# Patient Record
Sex: Male | Born: 1950 | Race: White | Hispanic: No | Smoking: Never smoker
Health system: Southern US, Community
[De-identification: ages and names within clinical notes are randomized; demographics above are authoritative.]

## PROBLEM LIST (undated history)

## (undated) DIAGNOSIS — M1990 Unspecified osteoarthritis, unspecified site: Secondary | ICD-10-CM

## (undated) DIAGNOSIS — E119 Type 2 diabetes mellitus without complications: Secondary | ICD-10-CM

## (undated) DIAGNOSIS — Z9889 Other specified postprocedural states: Secondary | ICD-10-CM

## (undated) DIAGNOSIS — N289 Disorder of kidney and ureter, unspecified: Secondary | ICD-10-CM

## (undated) DIAGNOSIS — J189 Pneumonia, unspecified organism: Secondary | ICD-10-CM

## (undated) DIAGNOSIS — C801 Malignant (primary) neoplasm, unspecified: Secondary | ICD-10-CM

## (undated) DIAGNOSIS — Z21 Asymptomatic human immunodeficiency virus [HIV] infection status: Secondary | ICD-10-CM

## (undated) DIAGNOSIS — F419 Anxiety disorder, unspecified: Secondary | ICD-10-CM

## (undated) DIAGNOSIS — F32A Depression, unspecified: Secondary | ICD-10-CM

## (undated) DIAGNOSIS — T8859XA Other complications of anesthesia, initial encounter: Secondary | ICD-10-CM

## (undated) DIAGNOSIS — I10 Essential (primary) hypertension: Secondary | ICD-10-CM

## (undated) DIAGNOSIS — R000 Tachycardia, unspecified: Secondary | ICD-10-CM

## (undated) DIAGNOSIS — B20 Human immunodeficiency virus [HIV] disease: Secondary | ICD-10-CM

## (undated) DIAGNOSIS — R1310 Dysphagia, unspecified: Secondary | ICD-10-CM

## (undated) DIAGNOSIS — Z87442 Personal history of urinary calculi: Secondary | ICD-10-CM

## (undated) DIAGNOSIS — E7800 Pure hypercholesterolemia, unspecified: Secondary | ICD-10-CM

## (undated) DIAGNOSIS — K219 Gastro-esophageal reflux disease without esophagitis: Secondary | ICD-10-CM

## (undated) DIAGNOSIS — C4491 Basal cell carcinoma of skin, unspecified: Secondary | ICD-10-CM

## (undated) DIAGNOSIS — B1920 Unspecified viral hepatitis C without hepatic coma: Secondary | ICD-10-CM

## (undated) DIAGNOSIS — G4730 Sleep apnea, unspecified: Secondary | ICD-10-CM

## (undated) HISTORY — PX: ANKLE ARTHROSCOPY: SHX545

## (undated) HISTORY — PX: TONSILLECTOMY: SUR1361

## (undated) HISTORY — PX: THORACOTOMY: SUR1349

## (undated) HISTORY — PX: BACK SURGERY: SHX140

## (undated) HISTORY — PX: APPENDECTOMY: SHX54

## (undated) HISTORY — DX: Basal cell carcinoma of skin, unspecified: C44.91

---

## 2014-10-23 DIAGNOSIS — F32A Depression, unspecified: Secondary | ICD-10-CM | POA: Insufficient documentation

## 2014-10-23 DIAGNOSIS — B370 Candidal stomatitis: Secondary | ICD-10-CM | POA: Insufficient documentation

## 2014-10-23 DIAGNOSIS — F329 Major depressive disorder, single episode, unspecified: Secondary | ICD-10-CM | POA: Insufficient documentation

## 2014-10-23 DIAGNOSIS — F419 Anxiety disorder, unspecified: Secondary | ICD-10-CM | POA: Insufficient documentation

## 2014-10-23 DIAGNOSIS — G4700 Insomnia, unspecified: Secondary | ICD-10-CM | POA: Insufficient documentation

## 2014-10-23 DIAGNOSIS — R21 Rash and other nonspecific skin eruption: Secondary | ICD-10-CM | POA: Insufficient documentation

## 2015-01-07 DIAGNOSIS — R351 Nocturia: Secondary | ICD-10-CM | POA: Insufficient documentation

## 2019-01-19 DIAGNOSIS — M5442 Lumbago with sciatica, left side: Secondary | ICD-10-CM | POA: Insufficient documentation

## 2019-01-19 DIAGNOSIS — E1143 Type 2 diabetes mellitus with diabetic autonomic (poly)neuropathy: Secondary | ICD-10-CM | POA: Insufficient documentation

## 2019-01-19 DIAGNOSIS — M542 Cervicalgia: Secondary | ICD-10-CM | POA: Insufficient documentation

## 2019-01-19 DIAGNOSIS — G5793 Unspecified mononeuropathy of bilateral lower limbs: Secondary | ICD-10-CM | POA: Insufficient documentation

## 2019-01-19 DIAGNOSIS — G43109 Migraine with aura, not intractable, without status migrainosus: Secondary | ICD-10-CM | POA: Insufficient documentation

## 2019-01-19 DIAGNOSIS — G8929 Other chronic pain: Secondary | ICD-10-CM | POA: Insufficient documentation

## 2019-02-10 ENCOUNTER — Encounter (HOSPITAL_COMMUNITY): Payer: Self-pay | Admitting: Emergency Medicine

## 2019-02-10 ENCOUNTER — Emergency Department (HOSPITAL_COMMUNITY)
Admission: EM | Admit: 2019-02-10 | Discharge: 2019-02-10 | Disposition: A | Discharge status: Home / Self Care | Payer: Medicare Other | Attending: Emergency Medicine | Admitting: Emergency Medicine

## 2019-02-10 DIAGNOSIS — Z21 Asymptomatic human immunodeficiency virus [HIV] infection status: Secondary | ICD-10-CM | POA: Diagnosis not present

## 2019-02-10 DIAGNOSIS — R202 Paresthesia of skin: Secondary | ICD-10-CM | POA: Diagnosis not present

## 2019-02-10 DIAGNOSIS — R519 Headache, unspecified: Secondary | ICD-10-CM

## 2019-02-10 DIAGNOSIS — R51 Headache: Secondary | ICD-10-CM | POA: Insufficient documentation

## 2019-02-10 MED ORDER — GABAPENTIN 300 MG PO CAPS
300.0000 mg | ORAL_CAPSULE | Freq: Once | ORAL | Status: AC
  Administered 2019-02-10: 300 mg via ORAL
  Filled 2019-02-10: qty 1

## 2019-02-10 MED ORDER — GABAPENTIN 300 MG PO CAPS: 300.0000 mg | ORAL_CAPSULE | Freq: Three times a day (TID) | ORAL | 0 refills | Status: DC

## 2019-02-10 NOTE — ED Provider Notes (Signed)
[facility] DEPT Provider Note   CSN: [id number] Arrival date & time: 02/10/19  8099    History   Chief Complaint Chief Complaint  Patient presents with  . Facial Pain  . Headache    HPI Brian Malone is a 68 y.o. male.     HPI  Patient presents with concern of left facial pain. Patient has multiple medical issues including HIV, trigeminal neuralgia, migraine headaches. He now presents due to left facial pain consistent with multiple prior episodes, though more severe than usual. This episode began about 3 days ago, and initially was accompanied by headache. Patient has had relief of his headache following OTC medication use. Notably, the patient is in transition for outpatient providers, and has outpatient medication provision scheduled for 3 days from now. He currently has no Topamax, or gabapentin, both of which have been used previously with success to control facial pain. He denies other focal complaints including ongoing headache, weakness anywhere, vision loss, confusion, disorientation, vomiting.   History reviewed. No pertinent past medical history.  There are no active problems to display for this patient.   History reviewed. No pertinent surgical history.      Home Medications    Prior to Admission medications   Medication Sig Start Date End Date Taking? Authorizing Provider  gabapentin (NEURONTIN) 300 MG capsule Take 1 capsule (300 mg total) by mouth 3 (three) times daily. 02/10/19   Carmin Muskrat, MD    Family History No family history on file.  Social History Social History   Tobacco Use  . Smoking status: Never Smoker  . Smokeless tobacco: Never Used  Substance Use Topics  . Alcohol use: Never    Frequency: Never  . Drug use: Never     Allergies   Patient has no allergy information on record.   Review of Systems Review of Systems  Constitutional:       Per HPI, otherwise negative  HENT:   Per HPI, otherwise negative  Respiratory:       Per HPI, otherwise negative  Cardiovascular:       Per HPI, otherwise negative  Gastrointestinal: Negative for vomiting.  Endocrine:       Negative aside from HPI  Genitourinary:       Neg aside from HPI   Musculoskeletal:       Per HPI, otherwise negative  Skin: Negative.   Allergic/Immunologic: Positive for immunocompromised state.  Neurological: Positive for headaches. Negative for syncope and weakness.     Physical Exam Updated Vital Signs There were no vitals taken for this visit.  Physical Exam Vitals signs and nursing note reviewed.  Constitutional:      General: He is not in acute distress.    Appearance: He is well-developed.  HENT:     Head: Normocephalic and atraumatic.  Eyes:     Conjunctiva/sclera: Conjunctivae normal.  Cardiovascular:     Rate and Rhythm: Normal rate and regular rhythm.  Pulmonary:     Effort: Pulmonary effort is normal. No respiratory distress.     Breath sounds: No stridor.  Abdominal:     General: There is no distension.  Skin:    General: Skin is warm and dry.  Neurological:     Mental Status: He is alert and oriented to person, place, and time.     Comments: Hyperesthesia left face, patient hesitant to allow touch.  No gross facial asymmetry, speech is normal.      ED Treatments / Results  Procedures Procedures (including critical care time)  Medications Ordered in ED Medications  gabapentin (NEURONTIN) capsule 300 mg (has no administration in time range)     Initial Impression / Assessment and Plan / ED Course  I have reviewed the triage vital signs and the nursing notes.  Pertinent labs & imaging results that were available during my care of the patient were reviewed by me and considered in my medical decision making (see chart for details).  The initial evaluation lengthy conversation on trigeminal neuralgia, the patient medical history, importance of following up  with outpatient providers, to which she has multiple referrals currently, patient received a dose of Neurontin. Chart review notable for multiple medications, which are seemingly ordered for arrival in the next 3 days. Without other focal complaints, with no evidence for neurovascular compromise, no distress, no hemodynamic instability, the patient was provided a short course of Neurontin until his medications arrive, as well as the initial dose in the emergency department.  Final Clinical Impressions(s) / ED Diagnoses   Final diagnoses:  Facial pain    ED Discharge Orders         Ordered    gabapentin (NEURONTIN) 300 MG capsule  3 times daily     02/10/19 1046           Carmin Muskrat, MD 02/10/19 1053

## 2019-02-10 NOTE — ED Triage Notes (Signed)
Patient here from home with complaints of headache and facial pain since Wednesday. Hx of trigeminal neuralgia. States that normal meds are suppose to be delivered on Tuesday but cannot wait.

## 2019-02-10 NOTE — Discharge Instructions (Signed)
As discussed, your evaluation today has been largely reassuring.  But, it is important that you monitor your condition carefully, and do not hesitate to return to the ED if you develop new, or concerning changes in your condition. ? ?Otherwise, please follow-up with your physician for appropriate ongoing care. ? ?

## 2019-02-22 DIAGNOSIS — I10 Essential (primary) hypertension: Secondary | ICD-10-CM | POA: Insufficient documentation

## 2019-02-22 DIAGNOSIS — M19171 Post-traumatic osteoarthritis, right ankle and foot: Secondary | ICD-10-CM | POA: Insufficient documentation

## 2019-02-22 DIAGNOSIS — G500 Trigeminal neuralgia: Secondary | ICD-10-CM | POA: Insufficient documentation

## 2019-02-22 DIAGNOSIS — N401 Enlarged prostate with lower urinary tract symptoms: Secondary | ICD-10-CM | POA: Insufficient documentation

## 2019-02-22 DIAGNOSIS — E1165 Type 2 diabetes mellitus with hyperglycemia: Secondary | ICD-10-CM | POA: Insufficient documentation

## 2019-03-07 ENCOUNTER — Other Ambulatory Visit: Payer: Self-pay

## 2019-03-07 ENCOUNTER — Other Ambulatory Visit: Payer: Self-pay | Admitting: Podiatry

## 2019-03-07 ENCOUNTER — Encounter: Payer: Self-pay | Admitting: Podiatry

## 2019-03-07 ENCOUNTER — Ambulatory Visit (INDEPENDENT_AMBULATORY_CARE_PROVIDER_SITE_OTHER): Payer: Medicare Other

## 2019-03-07 ENCOUNTER — Ambulatory Visit (INDEPENDENT_AMBULATORY_CARE_PROVIDER_SITE_OTHER): Payer: Medicare Other | Admitting: Podiatry

## 2019-03-07 VITALS — BP 145/89

## 2019-03-07 DIAGNOSIS — M25571 Pain in right ankle and joints of right foot: Secondary | ICD-10-CM

## 2019-03-07 DIAGNOSIS — M65171 Other infective (teno)synovitis, right ankle and foot: Secondary | ICD-10-CM | POA: Diagnosis not present

## 2019-03-07 DIAGNOSIS — M2041 Other hammer toe(s) (acquired), right foot: Secondary | ICD-10-CM

## 2019-03-07 DIAGNOSIS — M65972 Unspecified synovitis and tenosynovitis, left ankle and foot: Secondary | ICD-10-CM

## 2019-03-07 DIAGNOSIS — M2042 Other hammer toe(s) (acquired), left foot: Secondary | ICD-10-CM

## 2019-03-07 DIAGNOSIS — M65172 Other infective (teno)synovitis, left ankle and foot: Secondary | ICD-10-CM

## 2019-03-07 DIAGNOSIS — M25572 Pain in left ankle and joints of left foot: Principal | ICD-10-CM

## 2019-03-07 DIAGNOSIS — M65971 Unspecified synovitis and tenosynovitis, right ankle and foot: Secondary | ICD-10-CM

## 2019-03-07 DIAGNOSIS — M659 Synovitis and tenosynovitis, unspecified: Secondary | ICD-10-CM | POA: Diagnosis not present

## 2019-03-07 DIAGNOSIS — E0843 Diabetes mellitus due to underlying condition with diabetic autonomic (poly)neuropathy: Secondary | ICD-10-CM | POA: Diagnosis not present

## 2019-03-07 DIAGNOSIS — L84 Corns and callosities: Secondary | ICD-10-CM

## 2019-03-12 NOTE — Progress Notes (Signed)
   HPI: 68 year old male presenting today as a new patient with a chief complaint of sharp, shooting bilateral ankle pain that began a few years ago. He reports associated pain to the dorsum of the feet. Walking increases the pain. He has been using orthotics and taking OTC Tylenol for treatment. Patient is here for further evaluation and treatment.   No past medical history on file.    Objective: Physical Exam General: The patient is alert and oriented x3 in no acute distress.  Dermatology: Hyperkeratotic lesions present on the sub-first MPJs of the bilateral feet. Pain on palpation with a central nucleated core noted. Skin is cool, dry and supple bilateral lower extremities. Negative for open lesions or macerations.  Vascular: Palpable pedal pulses bilaterally. No edema or erythema noted. Capillary refill within normal limits.  Neurological: Epicritic and protective threshold diminished bilaterally.   Musculoskeletal Exam: All pedal and ankle joints range of motion within normal limits bilateral. Muscle strength 5/5 in all groups bilateral. Hammertoe contracture deformity noted to digits 2-5 of the bilateral feet. Pain with palpation noted to the anterior, lateral and medial aspects of the bilateral ankle joints.   Radiographic Exam: Hammertoe contracture deformity noted to the interphalangeal joints and MPJ of the respective hammertoe digits mentioned on clinical musculoskeletal exam. Fibular plate and screws noted to the right ankle. Hardware intact.   Assessment: 1. Bilateral ankle synovitis  2. Hammertoes noted to digits 2-5 bilateral  3. Pre-ulcerative callus lesions sub-first MPJs bilateral 4. T2DM with periopheral neuropathy    Plan of Care:  1. Patient evaluated. X-Rays reviewed.  2. Injection of 0.5 mLs Celestone Soluspan injected into the bilateral ankle joints.  3. Continue taking Gabapentin as directed by neurologist.  4. Continue taking OTC Tylenol daily. Patient has  CKD and cannot take NSAIDs.  5. Appointment with Liliane Channel, Pedorthist, for DM shoes and insoles. 6. Return to clinic in 4 weeks.      Edrick Kins, DPM [facility]  Dr. Edrick Kins, DPM    [address], [place] [address]                Office [phone]  Fax [phone]

## 2019-03-20 ENCOUNTER — Other Ambulatory Visit: Payer: Medicare Other | Admitting: Orthotics

## 2019-04-02 ENCOUNTER — Other Ambulatory Visit: Payer: Self-pay

## 2019-04-02 ENCOUNTER — Ambulatory Visit (INDEPENDENT_AMBULATORY_CARE_PROVIDER_SITE_OTHER): Payer: Medicare Other

## 2019-04-02 ENCOUNTER — Ambulatory Visit (INDEPENDENT_AMBULATORY_CARE_PROVIDER_SITE_OTHER): Payer: Medicare Other | Admitting: Podiatry

## 2019-04-02 ENCOUNTER — Other Ambulatory Visit: Payer: Self-pay | Admitting: Podiatry

## 2019-04-02 ENCOUNTER — Encounter: Payer: Self-pay | Admitting: Podiatry

## 2019-04-02 VITALS — Temp 97.5°F

## 2019-04-02 DIAGNOSIS — S92401A Displaced unspecified fracture of right great toe, initial encounter for closed fracture: Secondary | ICD-10-CM | POA: Diagnosis not present

## 2019-04-02 DIAGNOSIS — S9031XA Contusion of right foot, initial encounter: Secondary | ICD-10-CM

## 2019-04-02 DIAGNOSIS — S92414A Nondisplaced fracture of proximal phalanx of right great toe, initial encounter for closed fracture: Secondary | ICD-10-CM | POA: Diagnosis not present

## 2019-04-02 NOTE — Progress Notes (Signed)
   HPI: 68 year old male presents the office today with a new complaint regarding an injury that occurred approximately 10 days ago.  Patient states that he rushed to help a gentleman who was falling and he ended up falling down an embankment and landed in a hole with multiple injuries through his arms and foot.  He says that his right great toe swelled up and became bruised and swollen.  Pain with ambulation.  He presents for further treatment and evaluation  No past medical history on file.   Physical Exam: General: The patient is alert and oriented x3 in no acute distress.  Dermatology: Skin is warm, dry and supple bilateral lower extremities. Negative for open lesions or macerations.  Vascular: Palpable pedal pulses bilaterally.  Moderate edema noted to the right great toe capillary refill within normal limits.  Neurological: Epicritic and protective threshold grossly intact bilaterally.   Musculoskeletal Exam: Range of motion within normal limits to all pedal and ankle joints bilateral. Muscle strength 5/5 in all groups bilateral.  There is some edema noted to the right great toe around the first MPJ.  Pain on palpation and manipulation also noted consistent with the fracture.  Radiographic Exam:  Normal osseous mineralization.  Comminuted displaced fracture noted to the distal aspect of the proximal phalanx right great toe intra-articular to the IPJ.  Assessment: 1.  Closed, comminuted, displaced fracture proximal phalanx right great toe   Plan of Care:  1. Patient evaluated. X-Rays reviewed.  2. Today we discussed the conservative versus surgical management of the presenting pathology. The patient opts for surgical management. All possible complications and details of the procedure were explained. All patient questions were answered. No guarantees were expressed or implied. 3. Authorization for surgery was initiated today. Surgery will consist of open reduction internal fixation  right great toe fracture 4.  Return to clinic 1 week postop        Edrick Kins, DPM [facility]  Dr. Edrick Kins, DPM    [address], [place] [address]                Office [phone]  Fax [phone]

## 2019-04-04 ENCOUNTER — Other Ambulatory Visit: Payer: Self-pay | Admitting: Podiatry

## 2019-04-04 ENCOUNTER — Encounter: Payer: Self-pay | Admitting: Podiatry

## 2019-04-04 DIAGNOSIS — S92401A Displaced unspecified fracture of right great toe, initial encounter for closed fracture: Secondary | ICD-10-CM

## 2019-04-09 ENCOUNTER — Ambulatory Visit (INDEPENDENT_AMBULATORY_CARE_PROVIDER_SITE_OTHER): Payer: Medicare Other | Admitting: Podiatry

## 2019-04-09 ENCOUNTER — Ambulatory Visit (INDEPENDENT_AMBULATORY_CARE_PROVIDER_SITE_OTHER): Payer: Medicare Other

## 2019-04-09 ENCOUNTER — Other Ambulatory Visit: Payer: Self-pay

## 2019-04-09 VITALS — Temp 97.3°F

## 2019-04-09 DIAGNOSIS — Z09 Encounter for follow-up examination after completed treatment for conditions other than malignant neoplasm: Secondary | ICD-10-CM | POA: Diagnosis not present

## 2019-04-09 DIAGNOSIS — S92401A Displaced unspecified fracture of right great toe, initial encounter for closed fracture: Secondary | ICD-10-CM

## 2019-04-09 MED ORDER — OXYCODONE HCL 5 MG PO TABS: 5.0000 mg | ORAL_TABLET | Freq: Four times a day (QID) | ORAL | 0 refills | Status: DC | PRN

## 2019-04-09 MED ORDER — DOXYCYCLINE HYCLATE 100 MG PO TABS: 100.0000 mg | ORAL_TABLET | Freq: Two times a day (BID) | ORAL | 0 refills | Status: DC

## 2019-04-09 NOTE — Progress Notes (Signed)
   Subjective:  Patient presents today status post ORIF right great toe fracture. DOS: 04/04/2019.  Patient continues to have some pain in the toe with cramping of his feet but he otherwise has no new complaints.  He has been weightbearing in immobilization cam boot as directed.  No past medical history on file.    Objective/Physical Exam Neurovascular status intact.  Skin incisions appear to be well coapted with sutures and staples intact.  There is some increased erythema noted encompassing the great toe concerning for possible postoperative cellulitis.  No active bleeding noted. Moderate edema noted to the surgical extremity.  Radiographic Exam:  Percutaneous fixation pin and fracture sites appear to be stable with routine healing.  Assessment: 1. s/p ORIF right great toe. DOS: 04/04/2019   Plan of Care:  1. Patient was evaluated. X-rays reviewed 2.  Dressings were changed today.  Keep clean dry and intact x1 week 3.  Continue weightbearing in the immobilization cam boot 4.  Prescription for doxycycline 100 mg #20 5.  Prescription for oxycodone 5 mg #30 every 6 hours 6.  Return to clinic in 1 week   Edrick Kins, DPM [facility]  Dr. Edrick Kins, [address] [place]                                        [place],  [address]                Office [phone]  Fax [phone]

## 2019-04-15 ENCOUNTER — Emergency Department (HOSPITAL_BASED_OUTPATIENT_CLINIC_OR_DEPARTMENT_OTHER): Payer: Medicare Other

## 2019-04-15 ENCOUNTER — Encounter (HOSPITAL_BASED_OUTPATIENT_CLINIC_OR_DEPARTMENT_OTHER): Payer: Self-pay | Admitting: Emergency Medicine

## 2019-04-15 ENCOUNTER — Encounter: Payer: Self-pay | Admitting: Podiatry

## 2019-04-15 ENCOUNTER — Inpatient Hospital Stay (HOSPITAL_BASED_OUTPATIENT_CLINIC_OR_DEPARTMENT_OTHER)
Admission: EM | Admit: 2019-04-15 | Discharge: 2019-04-18 | DRG: 863 | Disposition: A | Discharge status: Home / Self Care | Payer: Medicare Other | Attending: Internal Medicine | Admitting: Internal Medicine

## 2019-04-15 ENCOUNTER — Other Ambulatory Visit: Payer: Self-pay

## 2019-04-15 DIAGNOSIS — E1140 Type 2 diabetes mellitus with diabetic neuropathy, unspecified: Secondary | ICD-10-CM | POA: Diagnosis present

## 2019-04-15 DIAGNOSIS — Z21 Asymptomatic human immunodeficiency virus [HIV] infection status: Secondary | ICD-10-CM | POA: Diagnosis present

## 2019-04-15 DIAGNOSIS — R6510 Systemic inflammatory response syndrome (SIRS) of non-infectious origin without acute organ dysfunction: Secondary | ICD-10-CM | POA: Diagnosis present

## 2019-04-15 DIAGNOSIS — L0390 Cellulitis, unspecified: Secondary | ICD-10-CM | POA: Diagnosis present

## 2019-04-15 DIAGNOSIS — Z885 Allergy status to narcotic agent status: Secondary | ICD-10-CM

## 2019-04-15 DIAGNOSIS — I129 Hypertensive chronic kidney disease with stage 1 through stage 4 chronic kidney disease, or unspecified chronic kidney disease: Secondary | ICD-10-CM | POA: Diagnosis present

## 2019-04-15 DIAGNOSIS — Z20828 Contact with and (suspected) exposure to other viral communicable diseases: Secondary | ICD-10-CM | POA: Diagnosis present

## 2019-04-15 DIAGNOSIS — T8141XA Infection following a procedure, superficial incisional surgical site, initial encounter: Principal | ICD-10-CM | POA: Diagnosis present

## 2019-04-15 DIAGNOSIS — E11628 Type 2 diabetes mellitus with other skin complications: Secondary | ICD-10-CM | POA: Diagnosis not present

## 2019-04-15 DIAGNOSIS — L089 Local infection of the skin and subcutaneous tissue, unspecified: Secondary | ICD-10-CM

## 2019-04-15 DIAGNOSIS — R000 Tachycardia, unspecified: Secondary | ICD-10-CM | POA: Diagnosis present

## 2019-04-15 DIAGNOSIS — Z7984 Long term (current) use of oral hypoglycemic drugs: Secondary | ICD-10-CM

## 2019-04-15 DIAGNOSIS — F419 Anxiety disorder, unspecified: Secondary | ICD-10-CM | POA: Diagnosis present

## 2019-04-15 DIAGNOSIS — R0602 Shortness of breath: Secondary | ICD-10-CM | POA: Diagnosis present

## 2019-04-15 DIAGNOSIS — G8929 Other chronic pain: Secondary | ICD-10-CM | POA: Diagnosis present

## 2019-04-15 DIAGNOSIS — G43909 Migraine, unspecified, not intractable, without status migrainosus: Secondary | ICD-10-CM | POA: Diagnosis present

## 2019-04-15 DIAGNOSIS — E1122 Type 2 diabetes mellitus with diabetic chronic kidney disease: Secondary | ICD-10-CM | POA: Diagnosis present

## 2019-04-15 DIAGNOSIS — N400 Enlarged prostate without lower urinary tract symptoms: Secondary | ICD-10-CM | POA: Diagnosis present

## 2019-04-15 DIAGNOSIS — T8149XA Infection following a procedure, other surgical site, initial encounter: Secondary | ICD-10-CM | POA: Diagnosis not present

## 2019-04-15 DIAGNOSIS — L03031 Cellulitis of right toe: Secondary | ICD-10-CM | POA: Diagnosis present

## 2019-04-15 DIAGNOSIS — E119 Type 2 diabetes mellitus without complications: Secondary | ICD-10-CM

## 2019-04-15 DIAGNOSIS — R079 Chest pain, unspecified: Secondary | ICD-10-CM

## 2019-04-15 DIAGNOSIS — R509 Fever, unspecified: Secondary | ICD-10-CM

## 2019-04-15 DIAGNOSIS — N183 Chronic kidney disease, stage 3 (moderate): Secondary | ICD-10-CM | POA: Diagnosis present

## 2019-04-15 DIAGNOSIS — Y838 Other surgical procedures as the cause of abnormal reaction of the patient, or of later complication, without mention of misadventure at the time of the procedure: Secondary | ICD-10-CM | POA: Diagnosis present

## 2019-04-15 DIAGNOSIS — Z79899 Other long term (current) drug therapy: Secondary | ICD-10-CM | POA: Diagnosis not present

## 2019-04-15 DIAGNOSIS — Z8249 Family history of ischemic heart disease and other diseases of the circulatory system: Secondary | ICD-10-CM | POA: Diagnosis not present

## 2019-04-15 HISTORY — DX: Anxiety disorder, unspecified: F41.9

## 2019-04-15 HISTORY — DX: Human immunodeficiency virus (HIV) disease: B20

## 2019-04-15 HISTORY — DX: Type 2 diabetes mellitus without complications: E11.9

## 2019-04-15 HISTORY — DX: Disorder of kidney and ureter, unspecified: N28.9

## 2019-04-15 HISTORY — DX: Essential (primary) hypertension: I10

## 2019-04-15 HISTORY — DX: Asymptomatic human immunodeficiency virus (hiv) infection status: Z21

## 2019-04-15 LAB — CBC WITH DIFFERENTIAL/PLATELET
Abs Immature Granulocytes: 0.04 10*3/uL (ref 0.00–0.07)
Basophils Absolute: 0 10*3/uL (ref 0.0–0.1)
Basophils Relative: 0 %
Eosinophils Absolute: 0.2 10*3/uL (ref 0.0–0.5)
Eosinophils Relative: 2 %
HCT: 43.2 % (ref 39.0–52.0)
Hemoglobin: 14.3 g/dL (ref 13.0–17.0)
Immature Granulocytes: 1 %
Lymphocytes Relative: 14 %
Lymphs Abs: 1 10*3/uL (ref 0.7–4.0)
MCH: 33.6 pg (ref 26.0–34.0)
MCHC: 33.1 g/dL (ref 30.0–36.0)
MCV: 101.6 fL — ABNORMAL HIGH (ref 80.0–100.0)
Monocytes Absolute: 0.9 10*3/uL (ref 0.1–1.0)
Monocytes Relative: 13 %
Neutro Abs: 5 10*3/uL (ref 1.7–7.7)
Neutrophils Relative %: 70 %
Platelets: 223 10*3/uL (ref 150–400)
RBC: 4.25 MIL/uL (ref 4.22–5.81)
RDW: 13.9 % (ref 11.5–15.5)
WBC: 7.1 10*3/uL (ref 4.0–10.5)
nRBC: 0 % (ref 0.0–0.2)

## 2019-04-15 LAB — COMPREHENSIVE METABOLIC PANEL
ALT: 20 U/L (ref 0–44)
AST: 23 U/L (ref 15–41)
Albumin: 4.1 g/dL (ref 3.5–5.0)
Alkaline Phosphatase: 96 U/L (ref 38–126)
Anion gap: 10 (ref 5–15)
BUN: 33 mg/dL — ABNORMAL HIGH (ref 8–23)
CO2: 18 mmol/L — ABNORMAL LOW (ref 22–32)
Calcium: 9.6 mg/dL (ref 8.9–10.3)
Chloride: 109 mmol/L (ref 98–111)
Creatinine, Ser: 1.45 mg/dL — ABNORMAL HIGH (ref 0.61–1.24)
GFR calc Af Amer: 58 mL/min — ABNORMAL LOW (ref >60)
GFR calc non Af Amer: 50 mL/min — ABNORMAL LOW (ref >60)
Glucose, Bld: 80 mg/dL (ref 70–99)
Potassium: 3.5 mmol/L (ref 3.5–5.1)
Sodium: 137 mmol/L (ref 135–145)
Total Bilirubin: 0.7 mg/dL (ref 0.3–1.2)
Total Protein: 7.8 g/dL (ref 6.5–8.1)

## 2019-04-15 LAB — SARS CORONAVIRUS 2 BY RT PCR (HOSPITAL ORDER, PERFORMED IN ~~LOC~~ HOSPITAL LAB): SARS Coronavirus 2: NEGATIVE

## 2019-04-15 LAB — TROPONIN I: Troponin I: <0.03 ng/mL (ref <0.03)

## 2019-04-15 LAB — LACTIC ACID, PLASMA
Lactic Acid, Venous: 0.9 mmol/L (ref 0.5–1.9)
Lactic Acid, Venous: 1.9 mmol/L (ref 0.5–1.9)

## 2019-04-15 MED ORDER — SODIUM CHLORIDE 0.9 % IV BOLUS
1000.0000 mL | Freq: Once | INTRAVENOUS | Status: AC
  Administered 2019-04-15: 1000 mL via INTRAVENOUS

## 2019-04-15 MED ORDER — PIPERACILLIN-TAZOBACTAM 3.375 G IVPB 30 MIN
3.3750 g | Freq: Once | INTRAVENOUS | Status: AC
  Administered 2019-04-15: 3.375 g via INTRAVENOUS
  Filled 2019-04-15 (×2): qty 50

## 2019-04-15 MED ORDER — OXYCODONE-ACETAMINOPHEN 5-325 MG PO TABS
1.0000 | ORAL_TABLET | Freq: Once | ORAL | Status: AC
  Administered 2019-04-15: 1 via ORAL
  Filled 2019-04-15: qty 1

## 2019-04-15 MED ORDER — VANCOMYCIN HCL IN DEXTROSE 1-5 GM/200ML-% IV SOLN
1000.0000 mg | Freq: Once | INTRAVENOUS | Status: AC
  Administered 2019-04-15: 1000 mg via INTRAVENOUS
  Filled 2019-04-15: qty 200

## 2019-04-15 MED ORDER — IOHEXOL 350 MG/ML SOLN
100.0000 mL | Freq: Once | INTRAVENOUS | Status: AC | PRN
  Administered 2019-04-15: 79 mL via INTRAVENOUS

## 2019-04-15 MED ORDER — HYDROMORPHONE HCL 1 MG/ML IJ SOLN
1.0000 mg | Freq: Once | INTRAMUSCULAR | Status: AC
  Administered 2019-04-15: 1 mg via INTRAVENOUS
  Filled 2019-04-15: qty 1

## 2019-04-15 NOTE — Progress Notes (Signed)
Received call from [facility] ER. Patient with infection to foot s/p ORIF of digit with k-wire fixation. Patient to be admitted to [facility]. Start broad spectrum antibiotics. Dr. Amalia Hailey notified and will see the patient in the morning. NPO after midnight.   Celesta Gentile, DPM O: [phone] C: [phone]

## 2019-04-15 NOTE — Progress Notes (Signed)
Pharmacy Antibiotic Note  CASTER FAYETTE is a 68 y.o. male admitted on 04/15/2019 with cellulitis.  Pharmacy has been consulted for vancomycin dosing. -WBC= 7.1, afebrile, SCr= 1.45, CrCL ~ 60  Plan: -vancomycin 2000mg  IV x1 followed by 129m IV q24h (calculated AUC= 474) -Will follow renal function, cultures and clinical progress   Height: 6' (182.9 cm) Weight: 230 lb (104.3 kg) IBW/kg (Calculated) : 77.6  Temp (24hrs), Avg:99.7 F (37.6 C), Min:99.5 F (37.5 C), Max:99.9 F (37.7 C)  Recent Labs  Lab 04/15/19 1540  WBC 7.1  CREATININE 1.45*  LATICACIDVEN 1.9    Estimated Creatinine Clearance: 63.4 mL/min (A) (by C-G formula based on SCr of 1.45 mg/dL (H)).    Allergies  Allergen Reactions  . Morphine Other (See Comments)    Itching     Antimicrobials this admission: 4/26  Dose adjustments this admission:   Microbiology results:   Thank you for allowing pharmacy to be a part of this patient's care.  Hildred Laser, PharmD Clinical Pharmacist **Pharmacist phone directory can now be found on [email or web address] ([email or web address]).  Listed under [place].

## 2019-04-15 NOTE — ED Notes (Signed)
[facility] notified (Tammy) - patient ready for transport 

## 2019-04-15 NOTE — ED Notes (Signed)
[facility] arrived to transport pt 

## 2019-04-15 NOTE — ED Provider Notes (Signed)
[facility]ERG[facility] DEPARTMENT Provider Note   CSN: [id number] Arrival date & time: 04/15/19  1459    History   Chief Complaint Chief Complaint  Patient presents with   Shortness of Breath    HPI Brian Malone is a 68 y.o. male.     The history is provided by the patient and medical records. No language interpreter was used.  Shortness of Breath  Severity:  Severe Onset quality:  Gradual Duration:  3 days Timing:  Constant Progression:  Waxing and waning Chronicity:  New Relieved by:  Nothing Worsened by:  Exertion and deep breathing Ineffective treatments:  None tried Associated symptoms: chest pain, cough and fever   Associated symptoms: no abdominal pain, no diaphoresis, no headaches, no hemoptysis, no neck pain, no rash, no sputum production, no vomiting and no wheezing   Risk factors: no hx of PE/DVT     Past Medical History:  Diagnosis Date   Anxiety    Diabetes mellitus without complication (HCC)    HIV (human immunodeficiency virus infection) ([place])    Hypertension    Renal disorder     There are no active problems to display for this patient.   Past Surgical History:  Procedure Laterality Date   ANKLE ARTHROSCOPY          Home Medications    Prior to Admission medications   Medication Sig Start Date End Date Taking? Authorizing Provider  busPIRone (BUSPAR) 10 MG tablet  01/22/19   [provider]  doxycycline (VIBRA-TABS) 100 MG tablet Take 1 tablet (100 mg total) by mouth 2 (two) times daily. 04/09/19   Edrick Kins, DPM  DULoxetine (CYMBALTA) 30 MG capsule  01/22/19   [provider]  gabapentin (NEURONTIN) 300 MG capsule Take 1 capsule (300 mg total) by mouth 3 (three) times daily. 02/10/19   Carmin Muskrat, MD  glipiZIDE (GLUCOTROL) 5 MG tablet Take by mouth.    [provider]  hydrOXYzine (VISTARIL) 50 MG capsule  01/22/19   [provider]  metFORMIN (GLUCOPHAGE) 500 MG tablet Take  by mouth. 02/22/19 05/23/19  [provider]  mirabegron ER (MYRBETRIQ) 50 MG TB24 tablet Take by mouth.    [provider]  oxyCODONE (ROXICODONE) 5 MG immediate release tablet Take 1 tablet (5 mg total) by mouth every 6 (six) hours as needed for severe pain. 04/09/19   Edrick Kins, DPM  pantoprazole (PROTONIX) 40 MG tablet  02/12/19   [provider]  propranolol ER (INDERAL LA) 60 MG 24 hr capsule  02/12/19   [provider]  rizatriptan (MAXALT) 10 MG tablet  03/21/19   [provider]  SUMAtriptan (IMITREX) 25 MG tablet Take by mouth. 02/22/19 03/24/19  [provider]  tamsulosin (FLOMAX) 0.4 MG CAPS capsule  02/12/19   [provider]  tiZANidine (ZANAFLEX) 4 MG tablet TK 1 T PO Q 8 HOURS 03/08/19   [provider]  topiramate (TOPAMAX) 25 MG tablet  02/12/19   [provider]  topiramate (TOPAMAX) 50 MG tablet  03/21/19   [provider]  TRIUMEQ 600-50-300 MG tablet TK 1 T PO QD 02/12/19   [provider]  valACYclovir (VALTREX) 1000 MG tablet TK 1 T PO  TID 03/08/19   [provider]  Vitamin D, Ergocalciferol, (DRISDOL) 1.25 MG (50000 UT) CAPS capsule TK 1 C PO Q WEEK FOR 30 DAYS 03/21/19   [provider]    Family History No family history  on file.  Social History Social History   Tobacco Use   Smoking status: Never Smoker   Smokeless tobacco: Never Used  Substance Use Topics   Alcohol use: Never    Frequency: Never   Drug use: Never     Allergies   Morphine   Review of Systems Review of Systems  Constitutional: Positive for chills and fever. Negative for diaphoresis and fatigue.  HENT: Negative for congestion.   Respiratory: Positive for cough, chest tightness and shortness of breath. Negative for hemoptysis, sputum production, choking, wheezing and stridor.   Cardiovascular: Positive for chest pain, palpitations and leg swelling.  Gastrointestinal: Negative  for abdominal pain, constipation, diarrhea, nausea and vomiting.  Genitourinary: Negative for flank pain.  Musculoskeletal: Negative for back pain, neck pain and neck stiffness.  Skin: Positive for wound. Negative for rash.  Neurological: Negative for dizziness, light-headedness and headaches.  Psychiatric/Behavioral: Negative for agitation.  All other systems reviewed and are negative.    Physical Exam Updated Vital Signs BP (!) 186/115 (BP Location: Left Arm)    Pulse (!) 144    Temp 99.5 F (37.5 C)    Resp (!) 30    Ht 6' (1.829 m)    Wt 104.3 kg    SpO2 100%    BMI 31.19 kg/m   Physical Exam Vitals signs and nursing note reviewed.  Constitutional:      General: He is not in acute distress.    Appearance: He is not ill-appearing, toxic-appearing or diaphoretic.  HENT:     Mouth/Throat:     Mouth: Mucous membranes are moist.  Eyes:     Extraocular Movements: Extraocular movements intact.     Pupils: Pupils are equal, round, and reactive to light.  Neck:     Musculoskeletal: Normal range of motion.  Cardiovascular:     Rate and Rhythm: Regular rhythm. Tachycardia present.     Heart sounds: No murmur.  Pulmonary:     Effort: Tachypnea present.     Breath sounds: Normal breath sounds. No decreased breath sounds, wheezing, rhonchi or rales.  Chest:     Chest wall: No tenderness.  Musculoskeletal:     Right lower leg: He exhibits tenderness. Edema present.     Left lower leg: He exhibits no tenderness. No edema.     Right foot: Tenderness, bony tenderness, swelling and laceration present.       Feet:  Skin:    Capillary Refill: Capillary refill takes less than 2 seconds.     Findings: Erythema present.  Neurological:     General: No focal deficit present.     Mental Status: He is alert.  Psychiatric:        Mood and Affect: Mood normal.         ED Treatments / Results  Labs (all labs ordered are listed, but only abnormal results are displayed) Labs Reviewed    CBC WITH DIFFERENTIAL/PLATELET - Abnormal; Notable for the following components:      Result Value   MCV 101.6 (*)    All other components within normal limits  COMPREHENSIVE METABOLIC PANEL - Abnormal; Notable for the following components:   CO2 18 (*)    BUN 33 (*)    Creatinine, Ser 1.45 (*)    GFR calc non Af Amer 50 (*)    GFR calc Af Amer 58 (*)    All other components within normal limits  SARS CORONAVIRUS 2 (HOSPITAL ORDER, [facility])  CULTURE, BLOOD (ROUTINE X 2)  CULTURE, BLOOD (ROUTINE X 2)  LACTIC ACID, PLASMA  TROPONIN I  LACTIC ACID, PLASMA    EKG EKG Interpretation  Date/Time:  Sunday April 15 2019 15:08:44 EDT Ventricular Rate:  135 PR Interval:    QRS Duration: 129 QT Interval:  311 QTC Calculation: 467 R Axis:   -28 Text Interpretation:  Sinus tachycardia Nonspecific intraventricular conduction delay Baseline wander in lead(s) V2 No prior ECG for comparison.  no STEMI Confirmed by Antony Blackbird [phone]) on 04/15/2019 3:43:51 PM   Radiology Ct Angio Chest Pe W And/or Wo Contrast  Result Date: 04/15/2019 CLINICAL DATA:  Possible COVID-19 short of breath chest tightness EXAM: CT ANGIOGRAPHY CHEST WITH CONTRAST TECHNIQUE: Multidetector CT imaging of the chest was performed using the standard protocol during bolus administration of intravenous contrast. Multiplanar CT image reconstructions and MIPs were obtained to evaluate the vascular anatomy. CONTRAST:  29mL OMNIPAQUE IOHEXOL 350 MG/ML SOLN COMPARISON:  CT 03/15/2019 FINDINGS: Cardiovascular: Poor opacification of the pulmonary arteries to the segmental level which limits the examination. No gross acute central embolus is seen. Nonaneurysmal aorta. No dissection. Mild aortic atherosclerosis. Coronary vascular calcification. No evidence of pulmonary embolism. Normal heart size. No pericardial effusion. Mediastinum/Nodes: Midline trachea. No thyroid mass. No significantly enlarged lymph  nodes. Esophagus within normal limits. Lungs/Pleura: Lungs are clear. No pleural effusion or pneumothorax. Upper Abdomen: No acute abnormality. Musculoskeletal: No chest wall abnormality. No acute or significant osseous findings. Review of the MIP images confirms the above findings. IMPRESSION: Limited opacification of the pulmonary arterial system limits evaluation for pulmonary emboli. No gross acute central PE is seen. No focal airspace disease. Aortic Atherosclerosis (ICD10-I70.0). Electronically Signed   By: Donavan Foil M.D.   On: 04/15/2019 19:20   US Venous Img Lower Unilateral Right  Result Date: 04/15/2019 CLINICAL DATA:  Right foot and ankle pain with erythema and swelling. Recent foot surgery. History of diabetes and neuropathy. EXAM: RIGHT LOWER EXTREMITY VENOUS DOPPLER ULTRASOUND TECHNIQUE: Gray-scale sonography with graded compression, as well as color Doppler and duplex ultrasound were performed to evaluate the lower extremity deep venous systems from the level of the common femoral vein and including the common femoral, femoral, profunda femoral, popliteal and calf veins including the posterior tibial, peroneal and gastrocnemius veins when visible. The superficial great saphenous vein was also interrogated. Spectral Doppler was utilized to evaluate flow at rest and with distal augmentation maneuvers in the common femoral, femoral and popliteal veins. COMPARISON:  Foot radiographs 04/15/2019 FINDINGS: Contralateral Common Femoral Vein: Respiratory phasicity is normal and symmetric with the symptomatic side. No evidence of thrombus. Normal compressibility. Common Femoral Vein: No evidence of thrombus. Normal compressibility, respiratory phasicity and response to augmentation. Saphenofemoral Junction: No evidence of thrombus. Normal compressibility and flow on color Doppler imaging. Profunda Femoral Vein: No evidence of thrombus. Normal compressibility and flow on color Doppler imaging. Femoral  Vein: No evidence of thrombus. Normal compressibility, respiratory phasicity and response to augmentation. Popliteal Vein: No evidence of thrombus. Normal compressibility, respiratory phasicity and response to augmentation. Calf Veins: No evidence of thrombus. Normal compressibility and flow on color Doppler imaging. Superficial Great Saphenous Vein: No evidence of thrombus. Normal compressibility. Other Findings:  None. IMPRESSION: No evidence of right lower extremity DVT. Electronically Signed   By: Richardean Sale M.D.   On: 04/15/2019 18:47   Dg Foot Complete Right  Result Date: 04/15/2019 CLINICAL DATA:  Wound infection EXAM: RIGHT FOOT COMPLETE - 3+ VIEW COMPARISON:  04/09/2019 FINDINGS:  Cutaneous staples over the first digit. External pin fixation of the first digit across comminuted fracture involving midportion of first proximal phalanx. Fracture lucencies remain visible. Minimal callus formation noted. No soft tissue emphysema. No frank bony destructive change. Small plantar calcaneal spur. Plate and fixating screws in the distal fibula. IMPRESSION: 1. Postsurgical changes of the first digit with comminuted fracture mid shaft of the first proximal phalanx and minimal bony callus. 2. No bony destructive changes by radiograph to suggest osteomyelitis. Soft tissue swelling without soft tissue emphysema Electronically Signed   By: Donavan Foil M.D.   On: 04/15/2019 17:28    Procedures Procedures (including critical care time)  CRITICAL CARE Performed by: Gwenyth Allegra Avila Albritton Total critical care time: 35 minutes Critical care time was exclusive of separately billable procedures and treating other patients. Critical care was necessary to treat or prevent imminent or life-threatening deterioration. Critical care was time spent personally by me on the following activities: development of treatment plan with patient and/or surrogate as well as nursing, discussions with consultants, evaluation of  patient's response to treatment, examination of patient, obtaining history from patient or surrogate, ordering and performing treatments and interventions, ordering and review of laboratory studies, ordering and review of radiographic studies, pulse oximetry and re-evaluation of patient's condition.   Medications Ordered in ED Medications  vancomycin (VANCOCIN) IVPB 1000 mg/200 mL premix (1,000 mg Intravenous New Bag/Given 04/15/19 2239)  sodium chloride 0.9 % bolus 1,000 mL (0 mLs Intravenous Stopped 04/15/19 1658)  sodium chloride 0.9 % bolus 1,000 mL (0 mLs Intravenous Stopped 04/15/19 2000)  iohexol (OMNIPAQUE) 350 MG/ML injection 100 mL (79 mLs Intravenous Contrast Given 04/15/19 1849)  piperacillin-tazobactam (ZOSYN) IVPB 3.375 g ( Intravenous Stopped 04/15/19 2040)  HYDROmorphone (DILAUDID) injection 1 mg (1 mg Intravenous Given 04/15/19 2008)  vancomycin (VANCOCIN) IVPB 1000 mg/200 mL premix ( Intravenous Stopped 04/15/19 2223)  oxyCODONE-acetaminophen (PERCOCET/ROXICET) 5-325 MG per tablet 1 tablet (1 tablet Oral Given 04/15/19 2157)   Brian Malone was evaluated in Emergency Department on 04/15/2019 for the symptoms described in the history of present illness. He was evaluated in the context of the global COVID-19 pandemic, which necessitated consideration that the patient might be at risk for infection with the SARS-CoV-2 virus that causes COVID-19. Institutional protocols and algorithms that pertain to the evaluation of patients at risk for COVID-19 are in a state of rapid change based on information released by regulatory bodies including the CDC and federal and state organizations. These policies and algorithms were followed during the patient's care in the ED.   Initial Impression / Assessment and Plan / ED Course  I have reviewed the triage vital signs and the nursing notes.  Pertinent labs & imaging results that were available during my care of the patient were reviewed by me and  considered in my medical decision making (see chart for details).        Brian Malone is a 68 y.o. male with a past medical history significant for HIV, hypertension, diabetes, anxiety, and recent right foot surgery 2 weeks ago who presents with worsened right foot pain, right lower leg pain, fevers, chills, chest tightness, shortness of breath, and tachycardia.  Patient reports that he had right foot surgery 2 weeks ago and several days after surgery was started on antibiotics after started to look infected.  Patient surgeon was Daylene Katayama with podiatry.  Patient reports that he has been on doxycycline for the last 2 weeks and he is finishing his antibiotics  in several days.  He has not looked at the wound in 2 weeks.  He reports that and 3 days he has a follow-up appointment.  He reports that for the last several days he has had worsened pain in his right foot as well as worsened pain in his right shin and calf.  He reports the veins of started to bulge.  He reports the pain radiates somewhat up his leg.  He reports he started having chest tightness and shortness of breath with any exertion for the last few days.  He is concerned he may have a blood clot as he reports he was not started on any anticoagulation.  He reports some chest pressure but denies sharp chest pain.  On arrival, patient's heart rate was in the 140s and he was tachypneic.  He denies significant nausea, vomiting, urinary symptoms or GI symptoms.  He denies any history of DVT or PE.  Patient reports he has had a mild dry cough and had a fever up to 101 yesterday.  He has been taking Tylenol for the fever.  EKG shows sinus tachycardia.  No STEMI seen.  On exam, patient's lungs are clear and chest is nontender.  Back is nontender, abdomen is nontender.  Patient has no murmur appreciated.  Patient's ankle boot was removed and the dressings were taken down.  Patient has evidence of infection and purulence in his surgical site.  It  is warm and erythematous.  It is foul-smelling.  Patient reports similar numbness to prior with his neuropathies.  It is tender to the touch.  Patient also has engorgement of his veins in the right lower leg with tenderness in the medial shin.  Clinically I am concerned about several etiologies.  I am concerned about worsened infection after failing outpatient antibiotics and his postsurgical wound.  Given the purulence, I am concerned patient may need to have the wound explored and washed out by a surgeon.  Patient will screen laboratory testing and x-ray to look for osteomyelitis or subcutaneous gas.  We will also obtain ultrasound to look for DVT and I will obtain a CT PE study to look for pulmonary embolism given his tachycardia.  Vital sign abnormalities may be due from the foot infection however we need to rule out pulmonary embolism.  We will also look for pneumonia on imaging and due to the current pandemic, will send a coronavirus test.  Anticipate admission for further management.  Patient given a liter fluid and his heart rate came down from the 140s into the 120 range.  CT scan shows no pulmonary ballismus.  No pneumonia seen.  DVT ultrasound was negative.  Patient's laboratory testing showed elevation of creatinine up to 1.45.  No leukocytosis and normal lactic acid.  Initial troponin negative.  Coronavirus test still in process.  X-ray of the foot showed no subcutaneous gas or osteomyelitis.  Due to concern for foot infection causing his vital sign abnormalities and symptoms, podiatry was called.  They recommended admission to hospital service at [facility], broad-spectrum antibiotic administration, n.p.o. at midnight, and they will see patient in the morning for surgical exploration and washout.  Patient was amenable to this plan and was given pain medication.  Patient remains tachycardic in the 120s after 2 L of fluid.  Patient will be admitted to hospitalist service for further  management.  Final Clinical Impressions(s) / ED Diagnoses   Final diagnoses:  Foot infection  Fever, unspecified fever cause  Tachycardia  Shortness of  breath  Chest pain, unspecified type    ED Discharge Orders    None      Clinical Impression: 1. Foot infection   2. Fever, unspecified fever cause   3. Tachycardia   4. Shortness of breath   5. Chest pain, unspecified type     Disposition: Admit  This note was prepared with assistance of Dragon voice recognition software. Occasional wrong-word or sound-a-like substitutions may have occurred due to the inherent limitations of voice recognition software.     Labrittany Wechter, Gwenyth Allegra, MD 04/15/19 2256

## 2019-04-15 NOTE — ED Notes (Signed)
EDP at bedside  

## 2019-04-15 NOTE — ED Triage Notes (Addendum)
SOB, chest tightness x 2 days. Cough and fever as well. Recent ankle surgery 2 weeks ago. Took tylenol at noon. States he was also just started on abx due to infection to the R ankle following surgery.

## 2019-04-15 NOTE — ED Notes (Signed)
EDP at bedside to update patient

## 2019-04-15 NOTE — ED Notes (Signed)
Contacted Dr. Merril Abbe for consult @ [phone]

## 2019-04-15 NOTE — ED Notes (Signed)
Patient transported to CT 

## 2019-04-16 ENCOUNTER — Encounter (HOSPITAL_COMMUNITY): Payer: Self-pay | Admitting: Internal Medicine

## 2019-04-16 DIAGNOSIS — E11628 Type 2 diabetes mellitus with other skin complications: Secondary | ICD-10-CM

## 2019-04-16 DIAGNOSIS — L03031 Cellulitis of right toe: Secondary | ICD-10-CM

## 2019-04-16 DIAGNOSIS — T8149XA Infection following a procedure, other surgical site, initial encounter: Secondary | ICD-10-CM

## 2019-04-16 DIAGNOSIS — R000 Tachycardia, unspecified: Secondary | ICD-10-CM

## 2019-04-16 DIAGNOSIS — E119 Type 2 diabetes mellitus without complications: Secondary | ICD-10-CM

## 2019-04-16 DIAGNOSIS — L0390 Cellulitis, unspecified: Secondary | ICD-10-CM | POA: Diagnosis present

## 2019-04-16 LAB — GLUCOSE, CAPILLARY
Glucose-Capillary: 119 mg/dL — ABNORMAL HIGH (ref 70–99)
Glucose-Capillary: 135 mg/dL — ABNORMAL HIGH (ref 70–99)
Glucose-Capillary: 137 mg/dL — ABNORMAL HIGH (ref 70–99)
Glucose-Capillary: 156 mg/dL — ABNORMAL HIGH (ref 70–99)
Glucose-Capillary: 180 mg/dL — ABNORMAL HIGH (ref 70–99)

## 2019-04-16 LAB — COMPREHENSIVE METABOLIC PANEL
ALT: 17 U/L (ref 0–44)
AST: 18 U/L (ref 15–41)
Albumin: 3.2 g/dL — ABNORMAL LOW (ref 3.5–5.0)
Alkaline Phosphatase: 82 U/L (ref 38–126)
Anion gap: 12 (ref 5–15)
BUN: 20 mg/dL (ref 8–23)
CO2: 17 mmol/L — ABNORMAL LOW (ref 22–32)
Calcium: 8.9 mg/dL (ref 8.9–10.3)
Chloride: 112 mmol/L — ABNORMAL HIGH (ref 98–111)
Creatinine, Ser: 1.57 mg/dL — ABNORMAL HIGH (ref 0.61–1.24)
GFR calc Af Amer: 53 mL/min — ABNORMAL LOW (ref >60)
GFR calc non Af Amer: 46 mL/min — ABNORMAL LOW (ref >60)
Glucose, Bld: 113 mg/dL — ABNORMAL HIGH (ref 70–99)
Potassium: 3.7 mmol/L (ref 3.5–5.1)
Sodium: 141 mmol/L (ref 135–145)
Total Bilirubin: 0.9 mg/dL (ref 0.3–1.2)
Total Protein: 6.7 g/dL (ref 6.5–8.1)

## 2019-04-16 LAB — CBC
HCT: 36.6 % — ABNORMAL LOW (ref 39.0–52.0)
Hemoglobin: 12.7 g/dL — ABNORMAL LOW (ref 13.0–17.0)
MCH: 34.7 pg — ABNORMAL HIGH (ref 26.0–34.0)
MCHC: 34.7 g/dL (ref 30.0–36.0)
MCV: 100 fL (ref 80.0–100.0)
Platelets: 206 10*3/uL (ref 150–400)
RBC: 3.66 MIL/uL — ABNORMAL LOW (ref 4.22–5.81)
RDW: 14 % (ref 11.5–15.5)
WBC: 4.3 10*3/uL (ref 4.0–10.5)
nRBC: 0 % (ref 0.0–0.2)

## 2019-04-16 LAB — TSH: TSH: 5.191 u[IU]/mL — ABNORMAL HIGH (ref 0.350–4.500)

## 2019-04-16 LAB — SEDIMENTATION RATE: Sed Rate: 48 mm/hr — ABNORMAL HIGH (ref 0–16)

## 2019-04-16 MED ORDER — BUSPIRONE HCL 5 MG PO TABS
10.0000 mg | ORAL_TABLET | Freq: Three times a day (TID) | ORAL | Status: DC
  Administered 2019-04-16 – 2019-04-18 (×7): 10 mg via ORAL
  Filled 2019-04-16 (×7): qty 2

## 2019-04-16 MED ORDER — ACETAMINOPHEN 650 MG RE SUPP: 650.0000 mg | Freq: Four times a day (QID) | RECTAL | Status: DC | PRN

## 2019-04-16 MED ORDER — METRONIDAZOLE 500 MG PO TABS
500.0000 mg | ORAL_TABLET | Freq: Three times a day (TID) | ORAL | Status: DC
  Administered 2019-04-16 – 2019-04-17 (×4): 500 mg via ORAL
  Filled 2019-04-16 (×4): qty 1

## 2019-04-16 MED ORDER — SODIUM CHLORIDE 0.9 % IV SOLN
2.0000 g | Freq: Three times a day (TID) | INTRAVENOUS | Status: DC
  Administered 2019-04-16 – 2019-04-17 (×4): 2 g via INTRAVENOUS
  Filled 2019-04-16 (×7): qty 2

## 2019-04-16 MED ORDER — PANTOPRAZOLE SODIUM 40 MG PO TBEC
40.0000 mg | DELAYED_RELEASE_TABLET | Freq: Every day | ORAL | Status: DC
  Administered 2019-04-16 – 2019-04-18 (×3): 40 mg via ORAL
  Filled 2019-04-16 (×3): qty 1

## 2019-04-16 MED ORDER — MIRABEGRON ER 50 MG PO TB24
50.0000 mg | ORAL_TABLET | Freq: Every day | ORAL | Status: DC
  Administered 2019-04-16 – 2019-04-17 (×2): 50 mg via ORAL
  Filled 2019-04-16 (×3): qty 1

## 2019-04-16 MED ORDER — OXYCODONE HCL 5 MG PO TABS
5.0000 mg | ORAL_TABLET | Freq: Four times a day (QID) | ORAL | Status: DC | PRN
  Administered 2019-04-16 – 2019-04-17 (×4): 5 mg via ORAL
  Filled 2019-04-16 (×4): qty 1

## 2019-04-16 MED ORDER — SODIUM CHLORIDE 0.9 % IV SOLN
INTRAVENOUS | Status: AC
  Administered 2019-04-16: 01:00:00 via INTRAVENOUS

## 2019-04-16 MED ORDER — VANCOMYCIN HCL 10 G IV SOLR
1250.0000 mg | INTRAVENOUS | Status: DC
  Administered 2019-04-16 – 2019-04-17 (×2): 1250 mg via INTRAVENOUS
  Filled 2019-04-16 (×3): qty 1250

## 2019-04-16 MED ORDER — ACETAMINOPHEN 500 MG PO TABS
1000.0000 mg | ORAL_TABLET | Freq: Three times a day (TID) | ORAL | Status: DC
  Administered 2019-04-16 – 2019-04-18 (×7): 1000 mg via ORAL
  Filled 2019-04-16 (×7): qty 2

## 2019-04-16 MED ORDER — ACETAMINOPHEN 325 MG PO TABS: 650.0000 mg | ORAL_TABLET | Freq: Four times a day (QID) | ORAL | Status: DC | PRN

## 2019-04-16 MED ORDER — INSULIN ASPART 100 UNIT/ML ~~LOC~~ SOLN
0.0000 [IU] | Freq: Three times a day (TID) | SUBCUTANEOUS | Status: DC
  Administered 2019-04-16 (×2): 2 [IU] via SUBCUTANEOUS
  Administered 2019-04-17: 1 [IU] via SUBCUTANEOUS
  Administered 2019-04-17 – 2019-04-18 (×3): 2 [IU] via SUBCUTANEOUS

## 2019-04-16 MED ORDER — TIZANIDINE HCL 4 MG PO TABS: 4.0000 mg | ORAL_TABLET | Freq: Three times a day (TID) | ORAL | Status: DC | PRN

## 2019-04-16 MED ORDER — PROPRANOLOL HCL ER 60 MG PO CP24
60.0000 mg | ORAL_CAPSULE | Freq: Every day | ORAL | Status: DC
  Administered 2019-04-16 – 2019-04-18 (×3): 60 mg via ORAL
  Filled 2019-04-16 (×3): qty 1

## 2019-04-16 MED ORDER — ABACAVIR-DOLUTEGRAVIR-LAMIVUD 600-50-300 MG PO TABS
1.0000 | ORAL_TABLET | Freq: Every day | ORAL | Status: DC
  Administered 2019-04-16 – 2019-04-17 (×2): 1 via ORAL
  Filled 2019-04-16 (×3): qty 1

## 2019-04-16 MED ORDER — SODIUM CHLORIDE 0.9 % IV SOLN
2.0000 g | Freq: Three times a day (TID) | INTRAVENOUS | Status: DC
  Administered 2019-04-16: 2 g via INTRAVENOUS
  Filled 2019-04-16 (×4): qty 2

## 2019-04-16 MED ORDER — TOPIRAMATE 25 MG PO TABS
25.0000 mg | ORAL_TABLET | Freq: Every day | ORAL | Status: DC
  Administered 2019-04-16 – 2019-04-18 (×3): 25 mg via ORAL
  Filled 2019-04-16 (×3): qty 1

## 2019-04-16 MED ORDER — HYDROMORPHONE HCL 1 MG/ML IJ SOLN
1.0000 mg | Freq: Four times a day (QID) | INTRAMUSCULAR | Status: DC | PRN
  Administered 2019-04-16: 1 mg via INTRAVENOUS
  Filled 2019-04-16: qty 1

## 2019-04-16 MED ORDER — ONDANSETRON HCL 4 MG/2ML IJ SOLN
4.0000 mg | Freq: Four times a day (QID) | INTRAMUSCULAR | Status: DC | PRN
  Administered 2019-04-16 – 2019-04-17 (×2): 4 mg via INTRAVENOUS
  Filled 2019-04-16 (×3): qty 2

## 2019-04-16 MED ORDER — ENOXAPARIN SODIUM 40 MG/0.4ML ~~LOC~~ SOLN
40.0000 mg | SUBCUTANEOUS | Status: DC
  Administered 2019-04-16 – 2019-04-17 (×2): 40 mg via SUBCUTANEOUS
  Filled 2019-04-16 (×2): qty 0.4

## 2019-04-16 MED ORDER — GABAPENTIN 300 MG PO CAPS
300.0000 mg | ORAL_CAPSULE | Freq: Three times a day (TID) | ORAL | Status: DC
  Administered 2019-04-16 – 2019-04-18 (×7): 300 mg via ORAL
  Filled 2019-04-16 (×7): qty 1

## 2019-04-16 MED ORDER — TAMSULOSIN HCL 0.4 MG PO CAPS
0.4000 mg | ORAL_CAPSULE | Freq: Every day | ORAL | Status: DC
  Administered 2019-04-16 – 2019-04-17 (×2): 0.4 mg via ORAL
  Filled 2019-04-16 (×2): qty 1

## 2019-04-16 MED ORDER — DULOXETINE HCL 30 MG PO CPEP
30.0000 mg | ORAL_CAPSULE | Freq: Every day | ORAL | Status: DC
  Administered 2019-04-16 – 2019-04-17 (×2): 30 mg via ORAL
  Filled 2019-04-16 (×4): qty 1

## 2019-04-16 NOTE — Consult Note (Signed)
   Podiatry consult note   Subjective:  Patient presents as an inpatient for postoperative infection after surgical ORIF status post ORIF right great toe fracture. DOS: 04/04/2019.  Patient states he felt fever with increased pain to the toe over the weekend and he presented to the urgent care which admitted him and transferred him to [facility].  Patient states he feels much better today.  Past Medical History:  Diagnosis Date  . Anxiety   . Diabetes mellitus without complication ([place])   . HIV (human immunodeficiency virus infection) ([place])   . Hypertension   . Renal disorder       Objective/Physical Exam Neurovascular status intact.  Maceration noted peri-incisional with some moderate edema noted around the incision site.  No active bleeding noted.  Skin staples intact.  Assessment: 1. s/p surgical ORIF right great toe fracture. DOS: 04/04/2019 2.  Postoperative cellulitis/infection right foot   Plan of Care:  1. Patient was evaluated.  Dressings were removed and the surgical site was evaluated 2.  At the moment there is no indication for return to the OR.  Continue IV antibiotics until discharge. 3.  Recommend oral antibiotics post discharge 4.  Recommend Betadine and dry sterile dressing.  Keep clean dry and intact until follow-up in the office post discharge 5.  Continue minimal weightbearing in the immobilization cam boot 6.  Follow-up in the office within 1 week of discharge    Edrick Kins, DPM [facility]  Dr. Edrick Kins, DPM    [address], [place] [address]                Office [phone]  Fax [phone]

## 2019-04-16 NOTE — Progress Notes (Signed)
PROGRESS NOTE    ROBB SIBAL  EQA:[id number] DOB: 06/01/1951 DOA: 04/15/2019 PCP: Doreatha Lew, MD      Brief Narrative:  Mr. Soulier is a 68 y.o. M with HIV well controlled, DM with neuropathy, chronic pain, migraines, BPH, and CKD stage III baseline creatinine 1.4 who presents with right great toe swelling and redness after recent surgery.  X-ray in ER showed no osteomyelitis.  Started on vancomycin and cefepime.     Assessment & Plan:  Post-operative infection in Diabetic foot -Continue vancomycin, cefepime -Add Flagyl  HIV -Continue Triumeq  Anxiety -Continue Cymbalta, BuSpar  Diabetes with neuropathy - Hold metformin, glipizide -Continue gabapentin -Continue sliding scale corrections  Chronic pain Chronic migraines -Continue Topamax, propranolol - Continue Cymbalta, gabapentin - Continue as needed oxycodone  BPH Continue Flomax, mirabegron   Non-anion gap metabolic acidosis Unclear cause  CKD III Creatinine stable relative to baseline    DVT prophylaxis: Lovenox Code Status: FULL Family Communication: None MDM and disposition Plan: This is a no charge note.  For further details, please see H&P by my partner Dr. Myna Hidalgo from earlier today.  The below labs and imaging reports were reviewed and summarized above.    The patient was admitted with diabetic foot with post-operative infection as well as weakness and malaise and early SIRS without frank sepsis.      Objective: Vitals:   04/15/19 2200 04/15/19 2230 04/15/19 2300 04/16/19 1518  BP: (!) 145/96 (!) 148/92 136/83 (!) 151/97  Pulse: (!) 112 (!) 121 (!) 115 69  Resp: 18 18 16 18   Temp:    98.3 F (36.8 C)  TempSrc:    Oral  SpO2: 100% 100% 100% 98%  Weight:      Height:        Intake/Output Summary (Last 24 hours) at 04/16/2019 1616 Last data filed at 04/16/2019 1500 Gross per 24 hour  Intake 3314.23 ml  Output 3300 ml  Net 14.23 ml   Filed Weights   04/15/19 1507   Weight: 104.3 kg    Examination: The patient was seen and examined.      Data Reviewed: I have personally reviewed following labs and imaging studies:  CBC: Recent Labs  Lab 04/15/19 1540 04/16/19 0321  WBC 7.1 4.3  NEUTROABS 5.0  --   HGB 14.3 12.7*  HCT 43.2 36.6*  MCV 101.6* 100.0  PLT 223 979   Basic Metabolic Panel: Recent Labs  Lab 04/15/19 1540 04/16/19 0321  NA 137 141  K 3.5 3.7  CL 109 112*  CO2 18* 17*  GLUCOSE 80 113*  BUN 33* 20  CREATININE 1.45* 1.57*  CALCIUM 9.6 8.9   GFR: Estimated Creatinine Clearance: 58.6 mL/min (A) (by C-G formula based on SCr of 1.57 mg/dL (H)). Liver Function Tests: Recent Labs  Lab 04/15/19 1540 04/16/19 0321  AST 23 18  ALT 20 17  ALKPHOS 96 82  BILITOT 0.7 0.9  PROT 7.8 6.7  ALBUMIN 4.1 3.2*   No results for input(s): LIPASE, AMYLASE in the last 168 hours. No results for input(s): AMMONIA in the last 168 hours. Coagulation Profile: No results for input(s): INR, PROTIME in the last 168 hours. Cardiac Enzymes: Recent Labs  Lab 04/15/19 1540  TROPONINI <0.03   BNP (last 3 results) No results for input(s): PROBNP in the last 8760 hours. HbA1C: No results for input(s): HGBA1C in the last 72 hours. CBG: Recent Labs  Lab 04/16/19 0057 04/16/19 0745 04/16/19 1123  GLUCAP 137* 119*  156*   Lipid Profile: No results for input(s): CHOL, HDL, LDLCALC, TRIG, CHOLHDL, LDLDIRECT in the last 72 hours. Thyroid Function Tests: Recent Labs    04/16/19 0321  TSH 5.191*   Anemia Panel: No results for input(s): VITAMINB12, FOLATE, FERRITIN, TIBC, IRON, RETICCTPCT in the last 72 hours. Urine analysis: No results found for: COLORURINE, APPEARANCEUR, LABSPEC, PHURINE, GLUCOSEU, HGBUR, BILIRUBINUR, KETONESUR, PROTEINUR, UROBILINOGEN, NITRITE, LEUKOCYTESUR Sepsis Labs: @LABRCNTIP (procalcitonin:4,lacticacidven:4)  ) Recent Results (from the past 240 hour(s))  Blood culture (routine x 2)     Status: None  (Preliminary result)   Collection Time: 04/15/19  3:40 PM  Result Value Ref Range Status   Specimen Description   Final    BLOOD BLOOD RIGHT WRIST Performed at [facility], [address] [place], [place] [address]    Special Requests   Final    BOTTLES DRAWN AEROBIC AND ANAEROBIC Blood Culture adequate volume Performed at [facility], [address] [place], [place] [address]    Culture   Final    NO GROWTH < 12 HOURS Performed at [facility], [place] [address] [place], [place] [address]    Report Status PENDING  Incomplete  Blood culture (routine x 2)     Status: None (Preliminary result)   Collection Time: 04/15/19  3:40 PM  Result Value Ref Range Status   Specimen Description   Final    BLOOD LEFT ARM Performed at [facility], [address] [place], [place] [address]    Special Requests   Final    BOTTLES DRAWN AEROBIC AND ANAEROBIC Blood Culture adequate volume Performed at [facility], [address] [place], [place] [address]    Culture   Final    NO GROWTH < 12 HOURS Performed at [facility], [place] [address] [place], [place] [address]    Report Status PENDING  Incomplete  SARS Coronavirus 2 [facility] order, Performed in [place] hospital lab)     Status: None   Collection Time: 04/15/19  3:40 PM  Result Value Ref Range Status   SARS Coronavirus 2 NEGATIVE NEGATIVE Final    Comment: (NOTE) If result is NEGATIVE SARS-CoV-2 target nucleic acids are NOT DETECTED. The SARS-CoV-2 RNA is generally detectable in upper and lower  respiratory specimens during the acute phase of infection. The lowest  concentration of SARS-CoV-2 viral copies this assay can detect is 250  copies / mL. A negative result does not preclude SARS-CoV-2 infection  and should not be used as the sole basis for treatment or other  patient management decisions.  A negative result may occur with  improper specimen collection / handling,  submission of specimen other  than nasopharyngeal swab, presence of viral mutation(s) within the  areas targeted by this assay, and inadequate number of viral copies  (<250 copies / mL). A negative result must be combined with clinical  observations, patient history, and epidemiological information. If result is POSITIVE SARS-CoV-2 target nucleic acids are DETECTED. The SARS-CoV-2 RNA is generally detectable in upper and lower  respiratory specimens dur ing the acute phase of infection.  Positive  results are indicative of active infection with SARS-CoV-2.  Clinical  correlation with patient history and other diagnostic information is  necessary to determine patient infection status.  Positive results do  not rule out bacterial infection or co-infection with other viruses. If result is PRESUMPTIVE POSTIVE SARS-CoV-2 nucleic acids MAY BE PRESENT.  A presumptive positive result was obtained on the submitted specimen  and confirmed on repeat testing.  While 2019 novel coronavirus  (SARS-CoV-2) nucleic acids may be present in the submitted sample  additional confirmatory testing may be necessary for epidemiological  and / or clinical management purposes  to differentiate between  SARS-CoV-2 and other Sarbecovirus currently known to infect humans.  If clinically indicated additional testing with an alternate test  methodology [phone]) is advised. The SARS-CoV-2 RNA is generally  detectable in upper and lower respiratory sp ecimens during the acute  phase of infection. The expected result is Negative. Fact Sheet for Patients:  StrictlyIdeas.no Fact Sheet for Healthcare Providers: BankingDealers.co.za This test is not yet approved or cleared by the [place] FDA and has been authorized for detection and/or diagnosis of SARS-CoV-2 by FDA under an Emergency Use Authorization (EUA).  This EUA will remain in effect (meaning this test can be  used) for the duration of the COVID-19 declaration under Section 564(b)(1) of the Act, 21 U.S.C. section 360bbb-3(b)(1), unless the authorization is terminated or revoked sooner. Performed at [facility], [place] [address], [place], [place] [address]          Radiology Studies: Ct Angio Chest Pe W And/or Wo Contrast  Result Date: 04/15/2019 CLINICAL DATA:  Possible COVID-19 short of breath chest tightness EXAM: CT ANGIOGRAPHY CHEST WITH CONTRAST TECHNIQUE: Multidetector CT imaging of the chest was performed using the standard protocol during bolus administration of intravenous contrast. Multiplanar CT image reconstructions and MIPs were obtained to evaluate the vascular anatomy. CONTRAST:  58mL OMNIPAQUE IOHEXOL 350 MG/ML SOLN COMPARISON:  CT 03/15/2019 FINDINGS: Cardiovascular: Poor opacification of the pulmonary arteries to the segmental level which limits the examination. No gross acute central embolus is seen. Nonaneurysmal aorta. No dissection. Mild aortic atherosclerosis. Coronary vascular calcification. No evidence of pulmonary embolism. Normal heart size. No pericardial effusion. Mediastinum/Nodes: Midline trachea. No thyroid mass. No significantly enlarged lymph nodes. Esophagus within normal limits. Lungs/Pleura: Lungs are clear. No pleural effusion or pneumothorax. Upper Abdomen: No acute abnormality. Musculoskeletal: No chest wall abnormality. No acute or significant osseous findings. Review of the MIP images confirms the above findings. IMPRESSION: Limited opacification of the pulmonary arterial system limits evaluation for pulmonary emboli. No gross acute central PE is seen. No focal airspace disease. Aortic Atherosclerosis (ICD10-I70.0). Electronically Signed   By: Donavan Foil M.D.   On: 04/15/2019 19:20   US Venous Img Lower Unilateral Right  Result Date: 04/15/2019 CLINICAL DATA:  Right foot and ankle pain with erythema and swelling. Recent foot surgery. History of diabetes  and neuropathy. EXAM: RIGHT LOWER EXTREMITY VENOUS DOPPLER ULTRASOUND TECHNIQUE: Gray-scale sonography with graded compression, as well as color Doppler and duplex ultrasound were performed to evaluate the lower extremity deep venous systems from the level of the common femoral vein and including the common femoral, femoral, profunda femoral, popliteal and calf veins including the posterior tibial, peroneal and gastrocnemius veins when visible. The superficial great saphenous vein was also interrogated. Spectral Doppler was utilized to evaluate flow at rest and with distal augmentation maneuvers in the common femoral, femoral and popliteal veins. COMPARISON:  Foot radiographs 04/15/2019 FINDINGS: Contralateral Common Femoral Vein: Respiratory phasicity is normal and symmetric with the symptomatic side. No evidence of thrombus. Normal compressibility. Common Femoral Vein: No evidence of thrombus. Normal compressibility, respiratory phasicity and response to augmentation. Saphenofemoral Junction: No evidence of thrombus. Normal compressibility and flow on color Doppler imaging. Profunda Femoral Vein: No evidence of  thrombus. Normal compressibility and flow on color Doppler imaging. Femoral Vein: No evidence of thrombus. Normal compressibility, respiratory phasicity and response to augmentation. Popliteal Vein: No evidence of thrombus. Normal compressibility, respiratory phasicity and response to augmentation. Calf Veins: No evidence of thrombus. Normal compressibility and flow on color Doppler imaging. Superficial Great Saphenous Vein: No evidence of thrombus. Normal compressibility. Other Findings:  None. IMPRESSION: No evidence of right lower extremity DVT. Electronically Signed   By: Richardean Sale M.D.   On: 04/15/2019 18:47   Dg Foot Complete Right  Result Date: 04/15/2019 CLINICAL DATA:  Wound infection EXAM: RIGHT FOOT COMPLETE - 3+ VIEW COMPARISON:  04/09/2019 FINDINGS: Cutaneous staples over the first  digit. External pin fixation of the first digit across comminuted fracture involving midportion of first proximal phalanx. Fracture lucencies remain visible. Minimal callus formation noted. No soft tissue emphysema. No frank bony destructive change. Small plantar calcaneal spur. Plate and fixating screws in the distal fibula. IMPRESSION: 1. Postsurgical changes of the first digit with comminuted fracture mid shaft of the first proximal phalanx and minimal bony callus. 2. No bony destructive changes by radiograph to suggest osteomyelitis. Soft tissue swelling without soft tissue emphysema Electronically Signed   By: Donavan Foil M.D.   On: 04/15/2019 17:28        Scheduled Meds: . abacavir-dolutegravir-lamiVUDine  1 tablet Oral Daily  . acetaminophen  1,000 mg Oral Q8H  . busPIRone  10 mg Oral TID  . DULoxetine  30 mg Oral Daily  . enoxaparin (LOVENOX) injection  40 mg Subcutaneous Q24H  . gabapentin  300 mg Oral TID  . insulin aspart  0-9 Units Subcutaneous TID WC  . metroNIDAZOLE  500 mg Oral Q8H  . mirabegron ER  50 mg Oral Daily  . pantoprazole  40 mg Oral Daily  . propranolol ER  60 mg Oral Daily  . tamsulosin  0.4 mg Oral QPC supper  . topiramate  25 mg Oral Daily   Continuous Infusions: . ceFEPime (MAXIPIME) IV 2 g (04/16/19 1252)  . vancomycin       LOS: 1 day    Time spent: 15 minutes    Edwin Dada, MD [facility] 04/16/2019, 4:16 PM     Pager [phone] --- please page though AMION:  [email or web address] If 7PM-7AM, please contact night-coverage

## 2019-04-16 NOTE — Progress Notes (Signed)
04/16/2019 3:51 AM Update:  Adding gram negative coverage with cefepime -Cefepime 2g IV q8h  Pharmacy Antibiotic Note  Brian Malone is a 68 y.o. male admitted on 04/15/2019 with cellulitis.  Pharmacy has been consulted for vancomycin dosing. -WBC= 7.1, afebrile, SCr= 1.45, CrCL ~ 60  Plan: -vancomycin 2000mg  IV x1 followed by 1212m IV q24h (calculated AUC= 474) -Will follow renal function, cultures and clinical progress   Height: 6' (182.9 cm) Weight: 230 lb (104.3 kg) IBW/kg (Calculated) : 77.6  Temp (24hrs), Avg:99.7 F (37.6 C), Min:99.5 F (37.5 C), Max:99.9 F (37.7 C)  Recent Labs  Lab 04/15/19 1540 04/15/19 2332  WBC 7.1  --   CREATININE 1.45*  --   LATICACIDVEN 1.9 0.9    Estimated Creatinine Clearance: 63.4 mL/min (A) (by C-G formula based on SCr of 1.45 mg/dL (H)).    Allergies  Allergen Reactions  . Morphine Other (See Comments)    Itching    Narda Bonds, PharmD, BCPS Clinical Pharmacist Phone: [phone]

## 2019-04-16 NOTE — H&P (Addendum)
TRH H&P    Patient Demographics:    Brian Malone, is a 68 y.o. male  MRN: [id number]  DOB - 1951/03/04  Admit Date - 04/15/2019  Referring MD/NP/PA:  Lajean Saver  Outpatient Primary MD for the patient is [name], [name], MD  Patient coming from:  Home=> [place]  Chief complaint-  cellulitis   HPI:    Brian Malone  is a 68 y.o. male,w dm2, hypertension, anxiety, hiv, s/p ORIF of 1st toe with K wire about 2 weeks ago, apparently presents with c/o right 1st toe infection . Pt states that his toe and foot started looking worse last week and was placed on doxycycline x 1 week without improvement.  Pt c/o fever, tachycardia, sob,  and generally not feeling like his foot is responding to oral abx.  Pt denies cp, palp, n/v, abd pain, diarrhea, brbpr, dysuria. Pt presented to [facility] for evaluation.   In Ed,  T99.9  P 115  R 16  Bp 136/83  Pox 100% on RA Wt 104.3  Wbc 7.1, Hgb 14.3, Plt 223 Na 137, k 3.5,  Bun 33, Creatinine 1.45 Ast 23, Alt 20 Lactic acid 1.9 Trop <0.03 SARS negative  Blood culture x2 pending  LE ultrasound=> negative DVT CTA chest =>  IMPRESSION: Limited opacification of the pulmonary arterial system limits evaluation for pulmonary emboli. No gross acute central PE is seen. No focal airspace disease.  Xray R foot IMPRESSION: 1. Postsurgical changes of the first digit with comminuted fracture mid shaft of the first proximal phalanx and minimal bony callus. 2. No bony destructive changes by radiograph to suggest osteomyelitis. Soft tissue swelling without soft tissue emphysema  Pt will be admitted for cellulitis of the right foot/toe.           Review of systems:    In addition to the HPI above,   No Headache, No changes with Vision or hearing, No problems swallowing food or Liquids, No Chest pain, Cough or Shortness of Breath, No Abdominal pain, No  Nausea or Vomiting, bowel movements are regular, No Blood in stool or Urine, No dysuria,  No new joints pains-aches,  No new weakness, tingling, numbness in any extremity, No recent weight gain or loss, No polyuria, polydypsia or polyphagia, No significant Mental Stressors.  All other systems reviewed and are negative.    Past History of the following :    Past Medical History:  Diagnosis Date  . Anxiety   . Diabetes mellitus without complication ([place])   . HIV (human immunodeficiency virus infection) ([place])   . Hypertension   . Renal disorder       Past Surgical History:  Procedure Laterality Date  . ANKLE ARTHROSCOPY        Social History:      Social History   Tobacco Use  . Smoking status: Never Smoker  . Smokeless tobacco: Never Used  Substance Use Topics  . Alcohol use: Never    Frequency: Never       Family History :  Family History  Problem Relation Age of Onset  . CAD Mother   . Lung cancer Mother   . CAD Father   . Lung cancer Father   . CAD Brother        Home Medications:   Prior to Admission medications   Medication Sig Start Date End Date Taking? Authorizing Provider  busPIRone (BUSPAR) 10 MG tablet  01/22/19   [provider]  doxycycline (VIBRA-TABS) 100 MG tablet Take 1 tablet (100 mg total) by mouth 2 (two) times daily. 04/09/19   Edrick Kins, DPM  DULoxetine (CYMBALTA) 30 MG capsule  01/22/19   [provider]  gabapentin (NEURONTIN) 300 MG capsule Take 1 capsule (300 mg total) by mouth 3 (three) times daily. 02/10/19   Carmin Muskrat, MD  glipiZIDE (GLUCOTROL) 5 MG tablet Take by mouth.    [provider]  hydrOXYzine (VISTARIL) 50 MG capsule  01/22/19   [provider]  metFORMIN (GLUCOPHAGE) 500 MG tablet Take by mouth. 02/22/19 05/23/19  [provider]  mirabegron ER (MYRBETRIQ) 50 MG TB24 tablet Take by mouth.    [provider]  oxyCODONE (ROXICODONE) 5 MG immediate release  tablet Take 1 tablet (5 mg total) by mouth every 6 (six) hours as needed for severe pain. 04/09/19   Edrick Kins, DPM  pantoprazole (PROTONIX) 40 MG tablet  02/12/19   [provider]  propranolol ER (INDERAL LA) 60 MG 24 hr capsule  02/12/19   [provider]  rizatriptan (MAXALT) 10 MG tablet  03/21/19   [provider]  SUMAtriptan (IMITREX) 25 MG tablet Take by mouth. 02/22/19 03/24/19  [provider]  tamsulosin (FLOMAX) 0.4 MG CAPS capsule  02/12/19   [provider]  tiZANidine (ZANAFLEX) 4 MG tablet TK 1 T PO Q 8 HOURS 03/08/19   [provider]  topiramate (TOPAMAX) 25 MG tablet  02/12/19   [provider]  topiramate (TOPAMAX) 50 MG tablet  03/21/19   [provider]  TRIUMEQ 600-50-300 MG tablet TK 1 T PO QD 02/12/19   [provider]  valACYclovir (VALTREX) 1000 MG tablet TK 1 T PO  TID 03/08/19   [provider]  Vitamin D, Ergocalciferol, (DRISDOL) 1.25 MG (50000 UT) CAPS capsule TK 1 C PO Q WEEK FOR 30 DAYS 03/21/19   [provider]     Allergies:     Allergies  Allergen Reactions  . Morphine Other (See Comments)    Itching      Physical Exam:   Vitals  Blood pressure 136/83, pulse (!) 115, temperature 99.9 F (37.7 C), temperature source Rectal, resp. rate 16, height 6' (1.829 m), weight 104.3 kg, SpO2 100 %.  1.  General: axox3  2. Psychiatric: euthymic  3. Neurologic: cn2-12 intact, reflexes 2+ symmetric, diffuse , no clonus, motor 5/5 in all 4 ext  4. HEENMT:  Anicteric, pupils 1.25m symmetric, direct, consensual, near intact mmm  5. Respiratory : CTAB  6. Cardiovascular : Borderline tachy s1, s2, no m/g/r  7. Gastrointestinal:  Abd: soft, nt, nd, +bs  8. Skin:  Ext: no c/c/e, redness, swelling right 1st toe  9.Musculoskeletal:  Good ROM  No adenopathy    Data Review:    CBC Recent Labs  Lab 04/15/19 1540  WBC 7.1  HGB 14.3  HCT 43.2  PLT 223   MCV 101.6*  MCH 33.6  MCHC 33.1  RDW 13.9  LYMPHSABS 1.0  MONOABS 0.9  EOSABS 0.2  BASOSABS 0.0   ------------------------------------------------------------------------------------------------------------------  Results for orders placed or performed during the hospital encounter of 04/15/19 (from the past 48 hour(s))  CBC with Differential     Status: Abnormal   Collection Time: 04/15/19  3:40 PM  Result Value Ref Range   WBC 7.1 4.0 - 10.5 K/uL   RBC 4.25 4.22 - 5.81 MIL/uL   Hemoglobin 14.3 13.0 - 17.0 g/dL   HCT 43.2 39.0 - 52.0 %   MCV 101.6 (H) 80.0 - 100.0 fL   MCH 33.6 26.0 - 34.0 pg   MCHC 33.1 30.0 - 36.0 g/dL   RDW 13.9 11.5 - 15.5 %   Platelets 223 150 - 400 K/uL   nRBC 0.0 0.0 - 0.2 %   Neutrophils Relative % 70 %   Neutro Abs 5.0 1.7 - 7.7 K/uL   Lymphocytes Relative 14 %   Lymphs Abs 1.0 0.7 - 4.0 K/uL   Monocytes Relative 13 %   Monocytes Absolute 0.9 0.1 - 1.0 K/uL   Eosinophils Relative 2 %   Eosinophils Absolute 0.2 0.0 - 0.5 K/uL   Basophils Relative 0 %   Basophils Absolute 0.0 0.0 - 0.1 K/uL   Immature Granulocytes 1 %   Abs Immature Granulocytes 0.04 0.00 - 0.07 K/uL    Comment: Performed at [facility], [address] [place], [place] [address]  Comprehensive metabolic panel     Status: Abnormal   Collection Time: 04/15/19  3:40 PM  Result Value Ref Range   Sodium 137 135 - 145 mmol/L   Potassium 3.5 3.5 - 5.1 mmol/L   Chloride 109 98 - 111 mmol/L   CO2 18 (L) 22 - 32 mmol/L   Glucose, Bld 80 70 - 99 mg/dL   BUN 33 (H) 8 - 23 mg/dL   Creatinine, Ser 1.45 (H) 0.61 - 1.24 mg/dL   Calcium 9.6 8.9 - 10.3 mg/dL   Total Protein 7.8 6.5 - 8.1 g/dL   Albumin 4.1 3.5 - 5.0 g/dL   AST 23 15 - 41 U/L   ALT 20 0 - 44 U/L   Alkaline Phosphatase 96 38 - 126 U/L   Total Bilirubin 0.7 0.3 - 1.2 mg/dL   GFR calc non Af Amer 50 (L) >60 mL/min   GFR calc Af Amer 58 (L) >60 mL/min   Anion gap 10 5 - 15    Comment: Performed at [facility], [address] [place], [place] [address]  Lactic acid, plasma     Status: None   Collection Time: 04/15/19  3:40 PM  Result Value Ref Range   Lactic Acid, Venous 1.9 0.5 - 1.9 mmol/L    Comment: Performed at [facility], [address] [place], [place] [address]  Troponin I - ONCE - STAT     Status: None   Collection Time: 04/15/19  3:40 PM  Result Value Ref Range   Troponin I <0.03 <0.03 ng/mL    Comment: Performed at [facility], [address] [place], [place] [address]  SARS Coronavirus 2 [facility] order, Performed in [place] hospital lab)     Status: None   Collection Time: 04/15/19  3:40 PM  Result Value Ref Range   SARS Coronavirus 2 NEGATIVE NEGATIVE    Comment: (NOTE) If result is NEGATIVE SARS-CoV-2 target nucleic acids are NOT DETECTED. The SARS-CoV-2 RNA is generally detectable in upper and lower  respiratory specimens during the acute  phase of infection. The lowest  concentration of SARS-CoV-2 viral copies this assay can detect is 250  copies / mL. A negative result does not preclude SARS-CoV-2 infection  and should not be used as the sole basis for treatment or other  patient management decisions.  A negative result may occur with  improper specimen collection / handling, submission of specimen other  than nasopharyngeal swab, presence of viral mutation(s) within the  areas targeted by this assay, and inadequate number of viral copies  (<250 copies / mL). A negative result must be combined with clinical  observations, patient history, and epidemiological information. If result is POSITIVE SARS-CoV-2 target nucleic acids are DETECTED. The SARS-CoV-2 RNA is generally detectable in upper and lower  respiratory specimens dur ing the acute phase of infection.  Positive  results are indicative of active infection with SARS-CoV-2.  Clinical  correlation with patient history and other diagnostic information is  necessary to  determine patient infection status.  Positive results do  not rule out bacterial infection or co-infection with other viruses. If result is PRESUMPTIVE POSTIVE SARS-CoV-2 nucleic acids MAY BE PRESENT.   A presumptive positive result was obtained on the submitted specimen  and confirmed on repeat testing.  While 2019 novel coronavirus  (SARS-CoV-2) nucleic acids may be present in the submitted sample  additional confirmatory testing may be necessary for epidemiological  and / or clinical management purposes  to differentiate between  SARS-CoV-2 and other Sarbecovirus currently known to infect humans.  If clinically indicated additional testing with an alternate test  methodology [id number]) is advised. The SARS-CoV-2 RNA is generally  detectable in upper and lower respiratory sp ecimens during the acute  phase of infection. The expected result is Negative. Fact Sheet for Patients:  StrictlyIdeas.no Fact Sheet for Healthcare Providers: BankingDealers.co.za This test is not yet approved or cleared by the [place] FDA and has been authorized for detection and/or diagnosis of SARS-CoV-2 by FDA under an Emergency Use Authorization (EUA).  This EUA will remain in effect (meaning this test can be used) for the duration of the COVID-19 declaration under Section 564(b)(1) of the Act, 21 U.S.C. section 360bbb-3(b)(1), unless the authorization is terminated or revoked sooner. Performed at [facility], [place] [address], [place], [place] [address]   Lactic acid, plasma     Status: None   Collection Time: 04/15/19 11:32 PM  Result Value Ref Range   Lactic Acid, Venous 0.9 0.5 - 1.9 mmol/L    Comment: Performed at [facility], [address] [place], [place] [address]  Glucose, capillary     Status: Abnormal   Collection Time: 04/16/19 12:57 AM  Result Value Ref Range   Glucose-Capillary 137 (H) 70 - 99 mg/dL    Chemistries   Recent Labs  Lab 04/15/19 1540  NA 137  K 3.5  CL 109  CO2 18*  GLUCOSE 80  BUN 33*  CREATININE 1.45*  CALCIUM 9.6  AST 23  ALT 20  ALKPHOS 96  BILITOT 0.7   ------------------------------------------------------------------------------------------------------------------  ------------------------------------------------------------------------------------------------------------------ GFR: Estimated Creatinine Clearance: 63.4 mL/min (A) (by C-G formula based on SCr of 1.45 mg/dL (H)). Liver Function Tests: Recent Labs  Lab 04/15/19 1540  AST 23  ALT 20  ALKPHOS 96  BILITOT 0.7  PROT 7.8  ALBUMIN 4.1   No results for input(s): LIPASE, AMYLASE in the last 168 hours. No results for input(s): AMMONIA in the last 168 hours. Coagulation Profile: No results for input(s): INR,  PROTIME in the last 168 hours. Cardiac Enzymes: Recent Labs  Lab 04/15/19 1540  TROPONINI <0.03   BNP (last 3 results) No results for input(s): PROBNP in the last 8760 hours. HbA1C: No results for input(s): HGBA1C in the last 72 hours. CBG: Recent Labs  Lab 04/16/19 0057  GLUCAP 137*   Lipid Profile: No results for input(s): CHOL, HDL, LDLCALC, TRIG, CHOLHDL, LDLDIRECT in the last 72 hours. Thyroid Function Tests: No results for input(s): TSH, T4TOTAL, FREET4, T3FREE, THYROIDAB in the last 72 hours. Anemia Panel: No results for input(s): VITAMINB12, FOLATE, FERRITIN, TIBC, IRON, RETICCTPCT in the last 72 hours.  --------------------------------------------------------------------------------------------------------------- Urine analysis: No results found for: COLORURINE, APPEARANCEUR, LABSPEC, PHURINE, GLUCOSEU, HGBUR, BILIRUBINUR, KETONESUR, PROTEINUR, UROBILINOGEN, NITRITE, LEUKOCYTESUR    Imaging Results:    Ct Angio Chest Pe W And/or Wo Contrast  Result Date: 04/15/2019 CLINICAL DATA:  Possible COVID-19 short of breath chest tightness EXAM: CT ANGIOGRAPHY CHEST WITH CONTRAST  TECHNIQUE: Multidetector CT imaging of the chest was performed using the standard protocol during bolus administration of intravenous contrast. Multiplanar CT image reconstructions and MIPs were obtained to evaluate the vascular anatomy. CONTRAST:  55m OMNIPAQUE IOHEXOL 350 MG/ML SOLN COMPARISON:  CT 03/15/2019 FINDINGS: Cardiovascular: Poor opacification of the pulmonary arteries to the segmental level which limits the examination. No gross acute central embolus is seen. Nonaneurysmal aorta. No dissection. Mild aortic atherosclerosis. Coronary vascular calcification. No evidence of pulmonary embolism. Normal heart size. No pericardial effusion. Mediastinum/Nodes: Midline trachea. No thyroid mass. No significantly enlarged lymph nodes. Esophagus within normal limits. Lungs/Pleura: Lungs are clear. No pleural effusion or pneumothorax. Upper Abdomen: No acute abnormality. Musculoskeletal: No chest wall abnormality. No acute or significant osseous findings. Review of the MIP images confirms the above findings. IMPRESSION: Limited opacification of the pulmonary arterial system limits evaluation for pulmonary emboli. No gross acute central PE is seen. No focal airspace disease. Aortic Atherosclerosis (ICD10-I70.0). Electronically Signed   By: KDonavan FoilM.D.   On: 04/15/2019 19:20   UKoreaVenous Img Lower Unilateral Right  Result Date: 04/15/2019 CLINICAL DATA:  Right foot and ankle pain with erythema and swelling. Recent foot surgery. History of diabetes and neuropathy. EXAM: RIGHT LOWER EXTREMITY VENOUS DOPPLER ULTRASOUND TECHNIQUE: Gray-scale sonography with graded compression, as well as color Doppler and duplex ultrasound were performed to evaluate the lower extremity deep venous systems from the level of the common femoral vein and including the common femoral, femoral, profunda femoral, popliteal and calf veins including the posterior tibial, peroneal and gastrocnemius veins when visible. The superficial  great saphenous vein was also interrogated. Spectral Doppler was utilized to evaluate flow at rest and with distal augmentation maneuvers in the common femoral, femoral and popliteal veins. COMPARISON:  Foot radiographs 04/15/2019 FINDINGS: Contralateral Common Femoral Vein: Respiratory phasicity is normal and symmetric with the symptomatic side. No evidence of thrombus. Normal compressibility. Common Femoral Vein: No evidence of thrombus. Normal compressibility, respiratory phasicity and response to augmentation. Saphenofemoral Junction: No evidence of thrombus. Normal compressibility and flow on color Doppler imaging. Profunda Femoral Vein: No evidence of thrombus. Normal compressibility and flow on color Doppler imaging. Femoral Vein: No evidence of thrombus. Normal compressibility, respiratory phasicity and response to augmentation. Popliteal Vein: No evidence of thrombus. Normal compressibility, respiratory phasicity and response to augmentation. Calf Veins: No evidence of thrombus. Normal compressibility and flow on color Doppler imaging. Superficial Great Saphenous Vein: No evidence of thrombus. Normal compressibility. Other Findings:  None. IMPRESSION: No evidence of right lower extremity DVT.  Electronically Signed   By: Richardean Sale M.D.   On: 04/15/2019 18:47   Dg Foot Complete Right  Result Date: 04/15/2019 CLINICAL DATA:  Wound infection EXAM: RIGHT FOOT COMPLETE - 3+ VIEW COMPARISON:  04/09/2019 FINDINGS: Cutaneous staples over the first digit. External pin fixation of the first digit across comminuted fracture involving midportion of first proximal phalanx. Fracture lucencies remain visible. Minimal callus formation noted. No soft tissue emphysema. No frank bony destructive change. Small plantar calcaneal spur. Plate and fixating screws in the distal fibula. IMPRESSION: 1. Postsurgical changes of the first digit with comminuted fracture mid shaft of the first proximal phalanx and minimal bony  callus. 2. No bony destructive changes by radiograph to suggest osteomyelitis. Soft tissue swelling without soft tissue emphysema Electronically Signed   By: Donavan Foil M.D.   On: 04/15/2019 17:28   Ekg: st at 135, nl axis, nl int, no st-t changes c/w ischemia   Assessment & Plan:    Principal Problem:   Wound infection after surgery Active Problems:   Tachycardia   Diabetes (HCC)   Cellulitis   Cellulitis, wound infection after surgery (w oral abx failure) Blood culture x2 pending Check ESR Check cbc in am Cont oxycodone for pain control Dilaudid 52m iv q6h prn (if oxycodone not effective) vanco iv , cefepime iv pharmacy to dose Podiatry consulted by ER, will be by in AM, appreciate input  Tachycardia Tele Check UDS Check TSH Check cardiac echo  Renal insufficiency (mild) Hydrate with ns iv Check cmp in am  Dm2 DC Metformin DC Glucotrol fsbs q4h, ISS  Diabetic neuropathy Cont Gabapentin 3052mpo tid  Bph Cont Flomax Cont Myrbetriq  Anxiety Cont Buspar Cont Duloxetine  H/o Migraines Cont Propranolol Cont Topiramate  Gerd Cont Protonix  HIV Cont home medication.    unclear if taking valtrex, not ordered, please confirm in AM  DVT Prophylaxis-   Lovenox - SCDs   AM Labs Ordered, also please review Full Orders  Family Communication: Admission, patients condition and plan of care including tests being ordered have been discussed with the patient  who indicate understanding and agree with the plan and Code Status.  Code Status:  FULL CODE  Admission status: Inpatient: Based on patients clinical presentation and evaluation of above clinical data, I have made determination that patient meets Inpatient criteria at this time. Pt failed oral abx,  Pt will require inpatient admission for iv abx for cellulitis (possibly early sepsis (fever tachycardia), as well as possible surgical intervention depending upon podiatry recommendations.    Time spent in  minutes : 70   JaJani Gravel.D on 04/16/2019 at 1:15 AM

## 2019-04-16 NOTE — Consult Note (Addendum)
WOC consult requested for right foot. Reviewed photo and progress notes in the EMR.  Pt recently had surgery to right foot/toe and has staples in place. Generalized edema and erythremia to the post-op incision. This complex condition is beyond the scope of practice for [facility] nursing; notes from Dr Jacqualyn Posey of podiatry service on 4/26 indicate, "Dr. Amalia Hailey notified and will see the patient in the morning. NPO after midnight." Please refer to podiatry service for further plan of care. Please re-consult if further assistance is needed.  Thank-you,  Julien Girt MSN, [place], [place], [place], [place]

## 2019-04-17 LAB — CBC
HCT: 35 % — ABNORMAL LOW (ref 39.0–52.0)
Hemoglobin: 11.9 g/dL — ABNORMAL LOW (ref 13.0–17.0)
MCH: 34.3 pg — ABNORMAL HIGH (ref 26.0–34.0)
MCHC: 34 g/dL (ref 30.0–36.0)
MCV: 100.9 fL — ABNORMAL HIGH (ref 80.0–100.0)
Platelets: 184 10*3/uL (ref 150–400)
RBC: 3.47 MIL/uL — ABNORMAL LOW (ref 4.22–5.81)
RDW: 13.9 % (ref 11.5–15.5)
WBC: 3.8 10*3/uL — ABNORMAL LOW (ref 4.0–10.5)
nRBC: 0 % (ref 0.0–0.2)

## 2019-04-17 LAB — BASIC METABOLIC PANEL
Anion gap: 8 (ref 5–15)
BUN: 21 mg/dL (ref 8–23)
CO2: 19 mmol/L — ABNORMAL LOW (ref 22–32)
Calcium: 8.6 mg/dL — ABNORMAL LOW (ref 8.9–10.3)
Chloride: 112 mmol/L — ABNORMAL HIGH (ref 98–111)
Creatinine, Ser: 1.53 mg/dL — ABNORMAL HIGH (ref 0.61–1.24)
GFR calc Af Amer: 54 mL/min — ABNORMAL LOW (ref >60)
GFR calc non Af Amer: 47 mL/min — ABNORMAL LOW (ref >60)
Glucose, Bld: 155 mg/dL — ABNORMAL HIGH (ref 70–99)
Potassium: 4.2 mmol/L (ref 3.5–5.1)
Sodium: 139 mmol/L (ref 135–145)

## 2019-04-17 LAB — GLUCOSE, CAPILLARY
Glucose-Capillary: 138 mg/dL — ABNORMAL HIGH (ref 70–99)
Glucose-Capillary: 145 mg/dL — ABNORMAL HIGH (ref 70–99)
Glucose-Capillary: 146 mg/dL — ABNORMAL HIGH (ref 70–99)
Glucose-Capillary: 154 mg/dL — ABNORMAL HIGH (ref 70–99)
Glucose-Capillary: 164 mg/dL — ABNORMAL HIGH (ref 70–99)
Glucose-Capillary: 190 mg/dL — ABNORMAL HIGH (ref 70–99)

## 2019-04-17 MED ORDER — PIPERACILLIN-TAZOBACTAM 3.375 G IVPB: 3.3750 g | Freq: Three times a day (TID) | INTRAVENOUS | Status: DC

## 2019-04-17 NOTE — Progress Notes (Signed)
Pharmacy Antibiotic Note  Brian Malone is a 68 y.o. male admitted on 04/15/2019 with diabetic wound infection. Patient intolerant of metronidazole, but will need anaerobic coverage. D/W MD, will change to zosyn. Pharmacy has been consulted for zosyn dosing.  Plan: Zosyn 3.375gm IV q8h (EI) Continue vancomycin Monitor for clinical course, de-escalation and LOT  Height: 6' (182.9 cm) Weight: 220 lb 10.9 oz (100.1 kg) IBW/kg (Calculated) : 77.6  Temp (24hrs), Avg:98.2 F (36.8 C), Min:98.1 F (36.7 C), Max:98.3 F (36.8 C)  Recent Labs  Lab 04/15/19 1540 04/15/19 2332 04/16/19 0321 04/17/19 0237  WBC 7.1  --  4.3 3.8*  CREATININE 1.45*  --  1.57* 1.53*  LATICACIDVEN 1.9 0.9  --   --     Estimated Creatinine Clearance: 59 mL/min (A) (by C-G formula based on SCr of 1.53 mg/dL (H)).    Allergies  Allergen Reactions  . Morphine Other (See Comments)    Itching     Plummer Matich A. Levada Dy, PharmD, [place] Please utilize Amion for appropriate phone number to reach the unit pharmacist ([place])    Theotis Burrow 04/17/2019 1:53 PM

## 2019-04-17 NOTE — Progress Notes (Signed)
Pt up to sink getting washed up. Reports his foot is starting to hurt, requested prn medication. Pt notes that several of his meds are not at the usual schedule:  Pt states his doctor and pharmacist told him to take Triumeq and pantoprazole 12 hours apart, that he usually takes the pantoprazole in the AM.  He said he takes the duloxetine at night to help him sleep.  He reports that he takes the mirabegron at night to help with his bladder spasms, opposite the tamsulosin, which he takes in the AM.  Pt reports his doctor recently changed his topiramate dose to 50mg  twice a day.

## 2019-04-17 NOTE — Progress Notes (Addendum)
PROGRESS NOTE    Brian Malone  JYN:[id number] DOB: 09/01/1951 DOA: 04/15/2019 PCP: Doreatha Lew, MD      Brief Narrative:  Brian Malone is a 68 y.o. M with HIV well controlled, DM with neuropathy, chronic pain, migraines, BPH, and CKD stage III baseline creatinine 1.4 who presents with right great toe swelling and redness after recent surgery.  X-ray in ER showed no osteomyelitis.  Started on vancomycin and cefepime.     Assessment & Plan:  Post-operative infection in Diabetic foot This appears to be responding well to vancomycin, cefepime and Flagyl, although the latter is causing nausea.   -Continue vancomycin -Change to Zosyn  ADDENDUM: ID Rph discussed case with me.  They feel given post-op nature, they recommend narrowing to staph/strep coverage with Vanc alone, discharge on e.g. Bactrim/cephalexin.     HIV -Continue Triumeq  Anxiety -Continue Cymbalta, BuSpar  Diabetes with neuropathy Glucoses well controlled -Metformin, glipizide, still on hold -Continue gabapentin -Continue sliding scale corrections   Chronic pain Chronic migraines -Continue Topamax, propranolol, Cymbalta, gabapentin, as needed oxycodone   BPH -Continue Flomax, mirabegron   Non-anion gap metabolic acidosis Persists, unclear cause, presumed from CKD  CKD III Creatinine stable relative to baseline        DVT prophylaxis: Lovenox Code Status: FULL Family Communication: None MDM and disposition Plan: The below labs and imaging reports were reviewed and summarized above.  Medication management as above.    The patient was admitted with diabetic foot with post-operative infection as well as weakness and malaise and early SIRS without frank sepsis.    He is improving, wound is improving.  We will continue IV antibiotics 24 more hours, likely home Wednesday with oral antibiotics.      Objective: Vitals:   04/16/19 1518 04/17/19 0500 04/17/19 0526 04/17/19 1040  BP:  (!) 151/97  126/67   Pulse: 69  (!) 58   Resp: 18   18  Temp: 98.3 F (36.8 C)  98.1 F (36.7 C)   TempSrc: Oral  Oral   SpO2: 98%  98%   Weight:  100.1 kg    Height:        Intake/Output Summary (Last 24 hours) at 04/17/2019 1335 Last data filed at 04/17/2019 1010 Gross per 24 hour  Intake 611.67 ml  Output 1100 ml  Net -488.33 ml   Filed Weights   04/15/19 1507 04/17/19 0500  Weight: 104.3 kg 100.1 kg    Examination: BP 126/67 (BP Location: Left Arm)    Pulse (!) 58    Temp 98.1 F (36.7 C) (Oral)    Resp 18    Ht 6' (1.829 m)    Wt 100.1 kg    SpO2 98%    BMI 29.93 kg/m   General:alert and in no distress.  Responds appropriately to questions.  Eye contact, dress and hygiene appropriate. HEENT: Corneas clear, conjunctivae and sclerae normal without injection or icterus, lids and lashes normal.  Visual tracking smooth.  OP moist without erythema, exudates, cobblestoning, or ulcers.  No airway deformities.  Neck supple.   Cardiac: RRR, nl S1-S2, no murmurs, rubs, gallops.  Capillary refill is less than 2 seconds.   Respiratory: Normal respiratory rate and rhythm.  CTAB without rales or wheezes. Abdomen: BS present.  No TTP or rebound all quadrants.  No masses or organomegaly.  No scars.     No ascites, distension. Extremities: The left great toe has a pin in it, it is wrapped, there is  no edema or redness of the midfoot.    Neuro: Sensorium intact.  Speech is fluent.  Naming is grossly intact, and the patient's recall, recent and remote, as well as general fund of knowledge seem within normal limits.  Muscle tone normal, without fasciculations.  Moves all extremities equally and with normal coordination.  Attention span and concentration are within normal limits.  Psych:  full range of affect.  Normal rate and rhythm of speech.  Thought content appropriate, and thought process linear.  No SI/HI, aural or visual hallucinations or delusions. Attention and concentration are normal.          Data Reviewed: I have personally reviewed following labs and imaging studies:  CBC: Recent Labs  Lab 04/15/19 1540 04/16/19 0321 04/17/19 0237  WBC 7.1 4.3 3.8*  NEUTROABS 5.0  --   --   HGB 14.3 12.7* 11.9*  HCT 43.2 36.6* 35.0*  MCV 101.6* 100.0 100.9*  PLT 223 206 267   Basic Metabolic Panel: Recent Labs  Lab 04/15/19 1540 04/16/19 0321 04/17/19 0237  NA 137 141 139  K 3.5 3.7 4.2  CL 109 112* 112*  CO2 18* 17* 19*  GLUCOSE 80 113* 155*  BUN 33* 20 21  CREATININE 1.45* 1.57* 1.53*  CALCIUM 9.6 8.9 8.6*   GFR: Estimated Creatinine Clearance: 59 mL/min (A) (by C-G formula based on SCr of 1.53 mg/dL (H)). Liver Function Tests: Recent Labs  Lab 04/15/19 1540 04/16/19 0321  AST 23 18  ALT 20 17  ALKPHOS 96 82  BILITOT 0.7 0.9  PROT 7.8 6.7  ALBUMIN 4.1 3.2*   No results for input(s): LIPASE, AMYLASE in the last 168 hours. No results for input(s): AMMONIA in the last 168 hours. Coagulation Profile: No results for input(s): INR, PROTIME in the last 168 hours. Cardiac Enzymes: Recent Labs  Lab 04/15/19 1540  TROPONINI <0.03   BNP (last 3 results) No results for input(s): PROBNP in the last 8760 hours. HbA1C: No results for input(s): HGBA1C in the last 72 hours. CBG: Recent Labs  Lab 04/16/19 2016 04/17/19 0027 04/17/19 0530 04/17/19 0800 04/17/19 1147  GLUCAP 135* 190* 138* 164* 146*   Lipid Profile: No results for input(s): CHOL, HDL, LDLCALC, TRIG, CHOLHDL, LDLDIRECT in the last 72 hours. Thyroid Function Tests: Recent Labs    04/16/19 0321  TSH 5.191*   Anemia Panel: No results for input(s): VITAMINB12, FOLATE, FERRITIN, TIBC, IRON, RETICCTPCT in the last 72 hours. Urine analysis: No results found for: COLORURINE, APPEARANCEUR, LABSPEC, PHURINE, GLUCOSEU, HGBUR, BILIRUBINUR, KETONESUR, PROTEINUR, UROBILINOGEN, NITRITE, LEUKOCYTESUR Sepsis Labs: @LABRCNTIP (procalcitonin:4,lacticacidven:4)  ) Recent Results (from the past 240  hour(s))  Blood culture (routine x 2)     Status: None (Preliminary result)   Collection Time: 04/15/19  3:40 PM  Result Value Ref Range Status   Specimen Description   Final    BLOOD BLOOD RIGHT WRIST Performed at [facility], [address] [place], [place] [address]    Special Requests   Final    BOTTLES DRAWN AEROBIC AND ANAEROBIC Blood Culture adequate volume Performed at [facility], [address] [place], [place] [address]    Culture   Final    NO GROWTH 2 DAYS Performed at [facility], [place] [address] [place], [place] [address]    Report Status PENDING  Incomplete  Blood culture (routine x 2)     Status: None (Preliminary result)   Collection Time: 04/15/19  3:40 PM  Result Value Ref Range Status   Specimen Description   Final    BLOOD LEFT ARM Performed at [facility], [address] [place], [place] [address]    Special Requests   Final    BOTTLES DRAWN AEROBIC AND ANAEROBIC Blood Culture adequate volume Performed at [facility], [address] [place], [place] [address]    Culture   Final    NO GROWTH 2 DAYS Performed at [facility], [place] [address], [place], [place] [address]    Report Status PENDING  Incomplete  SARS Coronavirus 2 [facility] order, Performed in [place] hospital lab)     Status: None   Collection Time: 04/15/19  3:40 PM  Result Value Ref Range Status   SARS Coronavirus 2 NEGATIVE NEGATIVE Final    Comment: (NOTE) If result is NEGATIVE SARS-CoV-2 target nucleic acids are NOT DETECTED. The SARS-CoV-2 RNA is generally detectable in upper and lower  respiratory specimens during the acute phase of infection. The lowest  concentration of SARS-CoV-2 viral copies this assay can detect is 250  copies / mL. A negative result does not preclude SARS-CoV-2 infection  and should not be used as the sole basis for treatment or other  patient management decisions.  A negative result may occur  with  improper specimen collection / handling, submission of specimen other  than nasopharyngeal swab, presence of viral mutation(s) within the  areas targeted by this assay, and inadequate number of viral copies  (<250 copies / mL). A negative result must be combined with clinical  observations, patient history, and epidemiological information. If result is POSITIVE SARS-CoV-2 target nucleic acids are DETECTED. The SARS-CoV-2 RNA is generally detectable in upper and lower  respiratory specimens dur ing the acute phase of infection.  Positive  results are indicative of active infection with SARS-CoV-2.  Clinical  correlation with patient history and other diagnostic information is  necessary to determine patient infection status.  Positive results do  not rule out bacterial infection or co-infection with other viruses. If result is PRESUMPTIVE POSTIVE SARS-CoV-2 nucleic acids MAY BE PRESENT.   A presumptive positive result was obtained on the submitted specimen  and confirmed on repeat testing.  While 2019 novel coronavirus  (SARS-CoV-2) nucleic acids may be present in the submitted sample  additional confirmatory testing may be necessary for epidemiological  and / or clinical management purposes  to differentiate between  SARS-CoV-2 and other [place]becovirus currently known to infect humans.  If clinically indicated additional testing with an alternate test  methodology [phone]) is advised. The SARS-CoV-2 RNA is generally  detectable in upper and lower respiratory sp ecimens during the acute  phase of infection. The expected result is Negative. Fact Sheet for Patients:  StrictlyIdeas.no Fact Sheet for Healthcare Providers: BankingDealers.co.za This test is not yet approved or cleared by the [place] FDA and has been authorized for detection and/or diagnosis of SARS-CoV-2 by FDA under an Emergency Use Authorization (EUA).  This EUA  will remain in effect (meaning this test can be used) for the duration of the COVID-19 declaration under Section 564(b)(1) of the Act, 21 U.S.C. section 360bbb-3(b)(1), unless the authorization is terminated or revoked sooner. Performed at [facility], [place] [address] [place], [place] [address]          Radiology Studies: Ct Angio Chest Pe W And/or Wo Contrast  Result Date: 04/15/2019 CLINICAL DATA:  Possible COVID-19 short of breath chest tightness EXAM:  CT ANGIOGRAPHY CHEST WITH CONTRAST TECHNIQUE: Multidetector CT imaging of the chest was performed using the standard protocol during bolus administration of intravenous contrast. Multiplanar CT image reconstructions and MIPs were obtained to evaluate the vascular anatomy. CONTRAST:  63mL OMNIPAQUE IOHEXOL 350 MG/ML SOLN COMPARISON:  CT 03/15/2019 FINDINGS: Cardiovascular: Poor opacification of the pulmonary arteries to the segmental level which limits the examination. No gross acute central embolus is seen. Nonaneurysmal aorta. No dissection. Mild aortic atherosclerosis. Coronary vascular calcification. No evidence of pulmonary embolism. Normal heart size. No pericardial effusion. Mediastinum/Nodes: Midline trachea. No thyroid mass. No significantly enlarged lymph nodes. Esophagus within normal limits. Lungs/Pleura: Lungs are clear. No pleural effusion or pneumothorax. Upper Abdomen: No acute abnormality. Musculoskeletal: No chest wall abnormality. No acute or significant osseous findings. Review of the MIP images confirms the above findings. IMPRESSION: Limited opacification of the pulmonary arterial system limits evaluation for pulmonary emboli. No gross acute central PE is seen. No focal airspace disease. Aortic Atherosclerosis (ICD10-I70.0). Electronically Signed   By: Donavan Foil M.D.   On: 04/15/2019 19:20   US Venous Img Lower Unilateral Right  Result Date: 04/15/2019 CLINICAL DATA:  Right foot and ankle pain with erythema and  swelling. Recent foot surgery. History of diabetes and neuropathy. EXAM: RIGHT LOWER EXTREMITY VENOUS DOPPLER ULTRASOUND TECHNIQUE: Gray-scale sonography with graded compression, as well as color Doppler and duplex ultrasound were performed to evaluate the lower extremity deep venous systems from the level of the common femoral vein and including the common femoral, femoral, profunda femoral, popliteal and calf veins including the posterior tibial, peroneal and gastrocnemius veins when visible. The superficial great saphenous vein was also interrogated. Spectral Doppler was utilized to evaluate flow at rest and with distal augmentation maneuvers in the common femoral, femoral and popliteal veins. COMPARISON:  Foot radiographs 04/15/2019 FINDINGS: Contralateral Common Femoral Vein: Respiratory phasicity is normal and symmetric with the symptomatic side. No evidence of thrombus. Normal compressibility. Common Femoral Vein: No evidence of thrombus. Normal compressibility, respiratory phasicity and response to augmentation. Saphenofemoral Junction: No evidence of thrombus. Normal compressibility and flow on color Doppler imaging. Profunda Femoral Vein: No evidence of thrombus. Normal compressibility and flow on color Doppler imaging. Femoral Vein: No evidence of thrombus. Normal compressibility, respiratory phasicity and response to augmentation. Popliteal Vein: No evidence of thrombus. Normal compressibility, respiratory phasicity and response to augmentation. Calf Veins: No evidence of thrombus. Normal compressibility and flow on color Doppler imaging. Superficial Great Saphenous Vein: No evidence of thrombus. Normal compressibility. Other Findings:  None. IMPRESSION: No evidence of right lower extremity DVT. Electronically Signed   By: Richardean Sale M.D.   On: 04/15/2019 18:47   Dg Foot Complete Right  Result Date: 04/15/2019 CLINICAL DATA:  Wound infection EXAM: RIGHT FOOT COMPLETE - 3+ VIEW COMPARISON:   04/09/2019 FINDINGS: Cutaneous staples over the first digit. External pin fixation of the first digit across comminuted fracture involving midportion of first proximal phalanx. Fracture lucencies remain visible. Minimal callus formation noted. No soft tissue emphysema. No frank bony destructive change. Small plantar calcaneal spur. Plate and fixating screws in the distal fibula. IMPRESSION: 1. Postsurgical changes of the first digit with comminuted fracture mid shaft of the first proximal phalanx and minimal bony callus. 2. No bony destructive changes by radiograph to suggest osteomyelitis. Soft tissue swelling without soft tissue emphysema Electronically Signed   By: Donavan Foil M.D.   On: 04/15/2019 17:28        Scheduled Meds:  abacavir-dolutegravir-lamiVUDine  1 tablet  Oral Daily   acetaminophen  1,000 mg Oral Q8H   busPIRone  10 mg Oral TID   DULoxetine  30 mg Oral Daily   enoxaparin (LOVENOX) injection  40 mg Subcutaneous Q24H   gabapentin  300 mg Oral TID   insulin aspart  0-9 Units Subcutaneous TID WC   metroNIDAZOLE  500 mg Oral Q8H   mirabegron ER  50 mg Oral Daily   pantoprazole  40 mg Oral Daily   propranolol ER  60 mg Oral Daily   tamsulosin  0.4 mg Oral QPC supper   topiramate  25 mg Oral Daily   Continuous Infusions:  ceFEPime (MAXIPIME) IV 2 g (04/17/19 1317)   vancomycin 1,250 mg (04/16/19 2140)     LOS: 2 days    Time spent: 25 minutes    Edwin Dada, MD [facility] 04/17/2019, 1:35 PM     Pager [phone] --- please page though AMION:  [email or web address] Password [email or web address] If 7PM-7AM, please contact night-coverage

## 2019-04-17 NOTE — TOC Initial Note (Signed)
Transition of Care [facility]) - Initial/Assessment Note    Patient Details  Name: Brian Malone MRN: [id number] Date of Birth: [birth year]  Transition of Care [facility]) CM/SW Contact:    Sharin Mons, RN Phone Number: 04/17/2019, 10:00 AM  Clinical Narrative:        Admitted with Cellulitis/ post operative infection, s/p ORIF R great toe. Hx of recent ORIF right great toe fracture. DOS: 04/04/2019     NCM spoke with pt @ bedside.Pt states lives alone. PTA independent with ADL's , no DMe usage. PCP: [name], [name], MD. States has transportation to home when d/c. Able to afford Rx meds pt states.   Brannan Cassedy (Brother)     [phone]      NCM following for TOC needs ...  Expected Discharge Plan: Home/Self Care Barriers to Discharge: Continued Medical Work up   Patient Goals and CMS Choice Patient states their goals for this hospitalization and ongoing recovery are:: "To go home" CMS Medicare.gov Compare Post Acute Care list provided to:: Patient    Expected Discharge Plan and Services Expected Discharge Plan: Home/Self Care In-house Referral: NA   Post Acute Care Choice: NA Living arrangements for the past 2 months: Single Family Home                 DME Arranged: N/A DME Agency: NA                  Prior Living Arrangements/Services Living arrangements for the past 2 months: Single Family Home Lives with:: Self Patient language and need for interpreter reviewed:: Yes Do you feel safe going back to the place where you live?: Yes      Need for Family Participation in Patient Care: No (Comment) Care giver support system in place?: No (comment)   Criminal Activity/Legal Involvement Pertinent to Current Situation/Hospitalization: No - Comment as needed  Activities of Daily Living Home Assistive Devices/Equipment: CBG Meter(medical boot) ADL Screening (condition at time of admission) Patient's cognitive ability adequate to safely complete daily  activities?: Yes Is the patient deaf or have difficulty hearing?: Yes Does the patient have difficulty seeing, even when wearing glasses/contacts?: No Does the patient have difficulty concentrating, remembering, or making decisions?: No Patient able to express need for assistance with ADLs?: No Does the patient have difficulty dressing or bathing?: No Independently performs ADLs?: Yes (appropriate for developmental age) Does the patient have difficulty walking or climbing stairs?: No Weakness of Legs: None Weakness of Arms/Hands: None  Permission Sought/Granted Permission sought to share information with : Case Manager Permission granted to share information with : Yes, Verbal Permission Granted              Emotional Assessment Appearance:: Appears stated age Attitude/Demeanor/Rapport: Engaged Affect (typically observed): Accepting Orientation: : Oriented to Self, Oriented to Situation, Oriented to Place, Oriented to  Time Alcohol / Substance Use: Never Used Psych Involvement: No (comment)  Admission diagnosis:  Shortness of breath [R06.02] Tachycardia [R00.0] Foot infection [L08.9] Fever, unspecified fever cause [R50.9] Chest pain, unspecified type [R07.9] Patient Active Problem List   Diagnosis Date Noted  . Tachycardia 04/16/2019  . Diabetes ([place]) 04/16/2019  . Cellulitis 04/16/2019  . Wound infection after surgery 04/15/2019   PCP:  Doreatha Lew, MD Pharmacy:   [facility], [place] [address] [place] [address] Phone: [phone] Fax: [phone]  [facility] [phone] [place], [place]  [place] [address] Phone: [phone] Fax: [phone]     Social Determinants of Health (SDOH) Interventions    Readmission Risk Interventions No flowsheet data found.

## 2019-04-18 ENCOUNTER — Ambulatory Visit (INDEPENDENT_AMBULATORY_CARE_PROVIDER_SITE_OTHER): Payer: Medicare Other | Admitting: Podiatry

## 2019-04-18 ENCOUNTER — Other Ambulatory Visit: Payer: Self-pay

## 2019-04-18 VITALS — Temp 98.1°F

## 2019-04-18 DIAGNOSIS — L089 Local infection of the skin and subcutaneous tissue, unspecified: Secondary | ICD-10-CM

## 2019-04-18 DIAGNOSIS — Z09 Encounter for follow-up examination after completed treatment for conditions other than malignant neoplasm: Secondary | ICD-10-CM

## 2019-04-18 DIAGNOSIS — S92401A Displaced unspecified fracture of right great toe, initial encounter for closed fracture: Secondary | ICD-10-CM

## 2019-04-18 LAB — GLUCOSE, CAPILLARY
Glucose-Capillary: 135 mg/dL — ABNORMAL HIGH (ref 70–99)
Glucose-Capillary: 151 mg/dL — ABNORMAL HIGH (ref 70–99)
Glucose-Capillary: 162 mg/dL — ABNORMAL HIGH (ref 70–99)
Glucose-Capillary: 211 mg/dL — ABNORMAL HIGH (ref 70–99)

## 2019-04-18 LAB — BASIC METABOLIC PANEL
Anion gap: 11 (ref 5–15)
BUN: 21 mg/dL (ref 8–23)
CO2: 19 mmol/L — ABNORMAL LOW (ref 22–32)
Calcium: 8.6 mg/dL — ABNORMAL LOW (ref 8.9–10.3)
Chloride: 109 mmol/L (ref 98–111)
Creatinine, Ser: 1.46 mg/dL — ABNORMAL HIGH (ref 0.61–1.24)
GFR calc Af Amer: 58 mL/min — ABNORMAL LOW (ref >60)
GFR calc non Af Amer: 50 mL/min — ABNORMAL LOW (ref >60)
Glucose, Bld: 175 mg/dL — ABNORMAL HIGH (ref 70–99)
Potassium: 3.7 mmol/L (ref 3.5–5.1)
Sodium: 139 mmol/L (ref 135–145)

## 2019-04-18 LAB — CBC
HCT: 37.5 % — ABNORMAL LOW (ref 39.0–52.0)
Hemoglobin: 12.9 g/dL — ABNORMAL LOW (ref 13.0–17.0)
MCH: 34.4 pg — ABNORMAL HIGH (ref 26.0–34.0)
MCHC: 34.4 g/dL (ref 30.0–36.0)
MCV: 100 fL (ref 80.0–100.0)
Platelets: 194 10*3/uL (ref 150–400)
RBC: 3.75 MIL/uL — ABNORMAL LOW (ref 4.22–5.81)
RDW: 13.8 % (ref 11.5–15.5)
WBC: 4.4 10*3/uL (ref 4.0–10.5)
nRBC: 0 % (ref 0.0–0.2)

## 2019-04-18 MED ORDER — CEPHALEXIN 500 MG PO CAPS: 500.0000 mg | ORAL_CAPSULE | Freq: Four times a day (QID) | ORAL | 0 refills | Status: DC

## 2019-04-18 MED ORDER — SULFAMETHOXAZOLE-TRIMETHOPRIM 800-160 MG PO TABS: 1.0000 | ORAL_TABLET | Freq: Two times a day (BID) | ORAL | 0 refills | Status: DC

## 2019-04-18 MED ORDER — CEPHALEXIN 500 MG PO CAPS: 500.0000 mg | ORAL_CAPSULE | Freq: Four times a day (QID) | ORAL | 0 refills | Status: AC

## 2019-04-18 MED FILL — SULFAMETHOXAZOLE-TMP DS TAB: 800-160 | 7 days supply | Qty: 14 | Fill #0

## 2019-04-18 MED FILL — CEPHALEXIN 500 MG CAPSULE: 500 | 7 days supply | Qty: 28 | Fill #0

## 2019-04-18 NOTE — TOC Transition Note (Addendum)
Transition of Care [facility]) - CM/SW Discharge Note   Patient Details  Name: Brian Malone MRN: [id number] Date of Birth: 25-Apr-1951  [facility]) CM/SW Contact:  Sharin Mons, RN Phone Number: 04/18/2019, 9:19 AM   Clinical Narrative:    Transition to home with self care. [place] pharmacy to deliver Rx meds to bedside prior to d/c. Pt states has transportation to home.    Barriers to discharge: barriers resolved   Patient Goals and CMS Choice Patient states their goals for this hospitalization and ongoing recovery are:: "To go home" CMS Medicare.gov Compare Post Acute Care list provided to:: Patient    Discharge Placement                       Discharge Plan and Services In-house Referral: NA   Post Acute Care Choice: NA          DME Arranged: N/A DME Agency: NA                  Social Determinants of Health (SDOH) Interventions     Readmission Risk Interventions No flowsheet data found.

## 2019-04-18 NOTE — Discharge Summary (Signed)
Physician Discharge Summary  XAINE SANSOM Malone:[id number] DOB: 06-29-1951 DOA: 04/15/2019  PCP: Doreatha Lew, MD  Admit date: 04/15/2019 Discharge date: 04/18/2019  Admitted From: home Disposition:  home  Recommendations for Outpatient Follow-up:  1. Follow up with Dr. Amalia Hailey today as scheduled in office  2. Please obtain BMP in 3-4 days as patient is on Bactrim  Home Health: none  Equipment/Devices: none  Discharge Condition: stable CODE STATUS: Full code Diet recommendation: diabetic   HPI: Per admitting MD, Brian Malone  is a 68 y.o. male,w dm2, hypertension, anxiety, hiv, s/p ORIF of 1st toe with K wire about 2 weeks ago, apparently presents with c/o right 1st toe infection . Pt states that his toe and foot started looking worse last week and was placed on doxycycline x 1 week without improvement.  Pt c/o fever, tachycardia, sob,  and generally not feeling like his foot is responding to oral abx.  Pt denies cp, palp, n/v, abd pain, diarrhea, brbpr, dysuria. Pt presented to [facility] for evaluation. In Ed,  T99.9  P 115  R 16  Bp 136/83  Pox 100% on RA Wt 104.3 Wbc 7.1, Hgb 14.3, Plt 223 Na 137, k 3.5,  Bun 33, Creatinine 1.45 Ast 23, Alt 20 Lactic acid 1.9 Trop <0.03 SARS negative  LE ultrasound=> negative DVT CTA chest =>  IMPRESSION: Limited opacification of the pulmonary arterial system limits evaluation for pulmonary emboli. No gross acute central PE is seen. No focal airspace disease.  Xray R foot IMPRESSION: 1. Postsurgical changes of the first digit with comminuted fracture mid shaft of the first proximal phalanx and minimal bony callus. 2. No bony destructive changes by radiograph to suggest osteomyelitis. Soft tissue swelling without soft tissue emphysema  Pt will be admitted for cellulitis of the right foot/toe.   Hospital Course: Postoperative infection diabetic foot-patient responded well to vancomycin and Flagyl, however he developed nausea with  Flagyl and this was discontinued.  He improved on intravenous antibiotics, cellulitis has resolved, discussed case with pharmacy and recommended Bactrim/cephalexin on discharge for staph and strep coverage.  Given improvement on IV antibiotics, he was transitioned to oral antibiotics and will be discharged home in stable condition for 7 additional days.  Recommend repeat a BMP to monitor renal function in 3 to 4 days as he is on Bactrim.  He has an appointment with his podiatrist Dr. Amalia Hailey this afternoon  HIV -Continue home medications  Diabetes with neuropathy -Continue home medications  Anxiety, chronic migraines, chronic pain -Continue home regimen  Chronic kidney disease stage III -Creatinine at baseline  Discharge Diagnoses:  Principal Problem:   Wound infection after surgery Active Problems:   Tachycardia   Diabetes ([place])   Cellulitis     Discharge Instructions   Allergies as of 04/18/2019      Reactions   Morphine Other (See Comments)   Itching       Medication List    STOP taking these medications   doxycycline 100 MG tablet Commonly known as:  VIBRA-TABS     TAKE these medications   acetaminophen 500 MG tablet Commonly known as:  TYLENOL Take 1,000 mg by mouth 3 (three) times daily.   busPIRone 10 MG tablet Commonly known as:  BUSPAR Take 10 mg by mouth 3 (three) times daily.   cephALEXin 500 MG capsule Commonly known as:  KEFLEX Take 1 capsule (500 mg total) by mouth 4 (four) times daily for 7 days.   DULoxetine 30 MG capsule  Commonly known as:  CYMBALTA Take 30 mg by mouth daily.   gabapentin 300 MG capsule Commonly known as:  Neurontin Take 1 capsule (300 mg total) by mouth 3 (three) times daily. What changed:    how much to take  when to take this  additional instructions   glipiZIDE 5 MG tablet Commonly known as:  GLUCOTROL Take 5 mg by mouth 2 (two) times a day.   hydrOXYzine 50 MG capsule Commonly known as:  VISTARIL Take 50 mg  by mouth daily as needed for anxiety or itching.   metFORMIN 500 MG tablet Commonly known as:  GLUCOPHAGE Take 500 mg by mouth 2 (two) times a day.   mirabegron ER 50 MG Tb24 tablet Commonly known as:  MYRBETRIQ Take 50 mg by mouth daily.   oxyCODONE 5 MG immediate release tablet Commonly known as:  Roxicodone Take 1 tablet (5 mg total) by mouth every 6 (six) hours as needed for severe pain.   pantoprazole 40 MG tablet Commonly known as:  PROTONIX Take 40 mg by mouth daily.   propranolol ER 80 MG 24 hr capsule Commonly known as:  INDERAL LA Take 80 mg by mouth daily.   rizatriptan 10 MG tablet Commonly known as:  MAXALT Take 10 mg by mouth as needed for migraine.   sulfamethoxazole-trimethoprim 800-160 MG tablet Commonly known as:  BACTRIM DS Take 1 tablet by mouth 2 (two) times daily.   tamsulosin 0.4 MG Caps capsule Commonly known as:  FLOMAX Take 0.4 mg by mouth daily.   tiZANidine 4 MG tablet Commonly known as:  ZANAFLEX Take 4 mg by mouth at bedtime.   topiramate 50 MG tablet Commonly known as:  TOPAMAX Take 50 mg by mouth 2 (two) times daily.   Triumeq 600-50-300 MG tablet Generic drug:  abacavir-dolutegravir-lamiVUDine Take 1 tablet by mouth daily.   Vitamin B-12 5000 MCG Tbdp Take 5,000 mcg by mouth daily.   Vitamin D (Ergocalciferol) 1.25 MG (50000 UT) Caps capsule Commonly known as:  DRISDOL Take 50,000 Units by mouth every 7 (seven) days.      Follow-up Information    [facility] up.   Why:  Follow-up in the office within 1 week of discharge  Contact information: Dr. Edrick Kins, DPM   [address], [place] [address]                Office [phone]  Fax [phone]          Consultations:  Podiatry   Procedures/Studies:  Ct Angio Chest Pe W And/or Wo Contrast  Result Date: 04/15/2019 CLINICAL DATA:  Possible COVID-19 short of breath chest tightness EXAM: CT  ANGIOGRAPHY CHEST WITH CONTRAST TECHNIQUE: Multidetector CT imaging of the chest was performed using the standard protocol during bolus administration of intravenous contrast. Multiplanar CT image reconstructions and MIPs were obtained to evaluate the vascular anatomy. CONTRAST:  37mL OMNIPAQUE IOHEXOL 350 MG/ML SOLN COMPARISON:  CT 03/15/2019 FINDINGS: Cardiovascular: Poor opacification of the pulmonary arteries to the segmental level which limits the examination. No gross acute central embolus is seen. Nonaneurysmal aorta. No dissection. Mild aortic atherosclerosis. Coronary vascular calcification. No evidence of pulmonary embolism. Normal heart size. No pericardial effusion. Mediastinum/Nodes: Midline trachea. No thyroid mass. No significantly  enlarged lymph nodes. Esophagus within normal limits. Lungs/Pleura: Lungs are clear. No pleural effusion or pneumothorax. Upper Abdomen: No acute abnormality. Musculoskeletal: No chest wall abnormality. No acute or significant osseous findings. Review of the MIP images confirms the above findings. IMPRESSION: Limited opacification of the pulmonary arterial system limits evaluation for pulmonary emboli. No gross acute central PE is seen. No focal airspace disease. Aortic Atherosclerosis (ICD10-I70.0). Electronically Signed   By: Donavan Foil M.D.   On: 04/15/2019 19:20   US Venous Img Lower Unilateral Right  Result Date: 04/15/2019 CLINICAL DATA:  Right foot and ankle pain with erythema and swelling. Recent foot surgery. History of diabetes and neuropathy. EXAM: RIGHT LOWER EXTREMITY VENOUS DOPPLER ULTRASOUND TECHNIQUE: Gray-scale sonography with graded compression, as well as color Doppler and duplex ultrasound were performed to evaluate the lower extremity deep venous systems from the level of the common femoral vein and including the common femoral, femoral, profunda femoral, popliteal and calf veins including the posterior tibial, peroneal and gastrocnemius veins  when visible. The superficial great saphenous vein was also interrogated. Spectral Doppler was utilized to evaluate flow at rest and with distal augmentation maneuvers in the common femoral, femoral and popliteal veins. COMPARISON:  Foot radiographs 04/15/2019 FINDINGS: Contralateral Common Femoral Vein: Respiratory phasicity is normal and symmetric with the symptomatic side. No evidence of thrombus. Normal compressibility. Common Femoral Vein: No evidence of thrombus. Normal compressibility, respiratory phasicity and response to augmentation. Saphenofemoral Junction: No evidence of thrombus. Normal compressibility and flow on color Doppler imaging. Profunda Femoral Vein: No evidence of thrombus. Normal compressibility and flow on color Doppler imaging. Femoral Vein: No evidence of thrombus. Normal compressibility, respiratory phasicity and response to augmentation. Popliteal Vein: No evidence of thrombus. Normal compressibility, respiratory phasicity and response to augmentation. Calf Veins: No evidence of thrombus. Normal compressibility and flow on color Doppler imaging. Superficial Great Saphenous Vein: No evidence of thrombus. Normal compressibility. Other Findings:  None. IMPRESSION: No evidence of right lower extremity DVT. Electronically Signed   By: Richardean Sale M.D.   On: 04/15/2019 18:47   Dg Foot Complete Right  Result Date: 04/15/2019 CLINICAL DATA:  Wound infection EXAM: RIGHT FOOT COMPLETE - 3+ VIEW COMPARISON:  04/09/2019 FINDINGS: Cutaneous staples over the first digit. External pin fixation of the first digit across comminuted fracture involving midportion of first proximal phalanx. Fracture lucencies remain visible. Minimal callus formation noted. No soft tissue emphysema. No frank bony destructive change. Small plantar calcaneal spur. Plate and fixating screws in the distal fibula. IMPRESSION: 1. Postsurgical changes of the first digit with comminuted fracture mid shaft of the first  proximal phalanx and minimal bony callus. 2. No bony destructive changes by radiograph to suggest osteomyelitis. Soft tissue swelling without soft tissue emphysema Electronically Signed   By: Donavan Foil M.D.   On: 04/15/2019 17:28   Dg Foot Complete Right  Result Date: 04/09/2019 Please see detailed radiograph report in office note.  Dg Foot Complete Right  Result Date: 04/02/2019 Please see detailed radiograph report in office note.     Subjective: - no chest pain, shortness of breath, no abdominal pain, nausea or vomiting.   Discharge Exam: BP (!) 141/79    Pulse (!) 59    Temp 97.9 F (36.6 C) (Oral)    Resp 18    Ht 6' (1.829 m)    Wt 99.6 kg    SpO2 98%    BMI 29.78 kg/m   General: Pt is alert, awake, not in acute distress  Cardiovascular: RRR, S1/S2 +, no rubs, no gallops Respiratory: CTA bilaterally, no wheezing, no rhonchi Abdominal: Soft, NT, ND, bowel sounds + Extremities: no edema, no cyanosis    The results of significant diagnostics from this hospitalization (including imaging, microbiology, ancillary and laboratory) are listed below for reference.     Microbiology: Recent Results (from the past 240 hour(s))  Blood culture (routine x 2)     Status: None (Preliminary result)   Collection Time: 04/15/19  3:40 PM  Result Value Ref Range Status   Specimen Description   Final    BLOOD BLOOD RIGHT WRIST Performed at [facility], [address] [place], [place] [address]    Special Requests   Final    BOTTLES DRAWN AEROBIC AND ANAEROBIC Blood Culture adequate volume Performed at [facility], [address] [place], [place] [address]    Culture   Final    NO GROWTH 3 DAYS Performed at [facility], [place] [address], [place], [place] [address]    Report Status PENDING  Incomplete  Blood culture (routine x 2)     Status: None (Preliminary result)   Collection Time: 04/15/19  3:40 PM  Result Value Ref Range Status   Specimen  Description   Final    BLOOD LEFT ARM Performed at [facility], [address] [place], [place] [address]    Special Requests   Final    BOTTLES DRAWN AEROBIC AND ANAEROBIC Blood Culture adequate volume Performed at [facility], [address] [place], [place] [address]    Culture   Final    NO GROWTH 3 DAYS Performed at [facility], [place] [address] [place],  [address]    Report Status PENDING  Incomplete  SARS Coronavirus 2 [facility] order, Performed in [place] hospital lab)     Status: None   Collection Time: 04/15/19  3:40 PM  Result Value Ref Range Status   SARS Coronavirus 2 NEGATIVE NEGATIVE Final    Comment: (NOTE) If result is NEGATIVE SARS-CoV-2 target nucleic acids are NOT DETECTED. The SARS-CoV-2 RNA is generally detectable in upper and lower  respiratory specimens during the acute phase of infection. The lowest  concentration of SARS-CoV-2 viral copies this assay can detect is 250  copies / mL. A negative result does not preclude SARS-CoV-2 infection  and should not be used as the sole basis for treatment or other  patient management decisions.  A negative result may occur with  improper specimen collection / handling, submission of specimen other  than nasopharyngeal swab, presence of viral mutation(s) within the  areas targeted by this assay, and inadequate number of viral copies  (<250 copies / mL). A negative result must be combined with clinical  observations, patient history, and epidemiological information. If result is POSITIVE SARS-CoV-2 target nucleic acids are DETECTED. The SARS-CoV-2 RNA is generally detectable in upper and lower  respiratory specimens dur ing the acute phase of infection.  Positive  results are indicative of active infection with SARS-CoV-2.  Clinical  correlation with patient history and other diagnostic information is  necessary to determine patient infection status.  Positive results do    not rule out bacterial infection or co-infection with other viruses. If result is PRESUMPTIVE POSTIVE SARS-CoV-2 nucleic acids MAY BE PRESENT.   A presumptive positive result was obtained on the submitted specimen  and confirmed on repeat testing.  While 2019 novel coronavirus  (  SARS-CoV-2) nucleic acids may be present in the submitted sample  additional confirmatory testing may be necessary for epidemiological  and / or clinical management purposes  to differentiate between  SARS-CoV-2 and other Sarbecovirus currently known to infect humans.  If clinically indicated additional testing with an alternate test  methodology [phone]) is advised. The SARS-CoV-2 RNA is generally  detectable in upper and lower respiratory sp ecimens during the acute  phase of infection. The expected result is Negative. Fact Sheet for Patients:  StrictlyIdeas.no Fact Sheet for Healthcare Providers: BankingDealers.co.za This test is not yet approved or cleared by the [place] FDA and has been authorized for detection and/or diagnosis of SARS-CoV-2 by FDA under an Emergency Use Authorization (EUA).  This EUA will remain in effect (meaning this test can be used) for the duration of the COVID-19 declaration under Section 564(b)(1) of the Act, 21 U.S.C. section 360bbb-3(b)(1), unless the authorization is terminated or revoked sooner. Performed at [facility], [place] [address] [place], [place] [address]      Labs: BNP (last 3 results) No results for input(s): BNP in the last 8760 hours. Basic Metabolic Panel: Recent Labs  Lab 04/15/19 1540 04/16/19 0321 04/17/19 0237 04/18/19 0343  NA 137 141 139 139  K 3.5 3.7 4.2 3.7  CL 109 112* 112* 109  CO2 18* 17* 19* 19*  GLUCOSE 80 113* 155* 175*  BUN 33* 20 21 21   CREATININE 1.45* 1.57* 1.53* 1.46*  CALCIUM 9.6 8.9 8.6* 8.6*   Liver Function Tests: Recent Labs  Lab 04/15/19 1540 04/16/19 0321   AST 23 18  ALT 20 17  ALKPHOS 96 82  BILITOT 0.7 0.9  PROT 7.8 6.7  ALBUMIN 4.1 3.2*   No results for input(s): LIPASE, AMYLASE in the last 168 hours. No results for input(s): AMMONIA in the last 168 hours. CBC: Recent Labs  Lab 04/15/19 1540 04/16/19 0321 04/17/19 0237 04/18/19 0343  WBC 7.1 4.3 3.8* 4.4  NEUTROABS 5.0  --   --   --   HGB 14.3 12.7* 11.9* 12.9*  HCT 43.2 36.6* 35.0* 37.5*  MCV 101.6* 100.0 100.9* 100.0  PLT 223 206 184 194   Cardiac Enzymes: Recent Labs  Lab 04/15/19 1540  TROPONINI <0.03   BNP: Invalid input(s): POCBNP CBG: Recent Labs  Lab 04/17/19 2019 04/18/19 0017 04/18/19 0457 04/18/19 0745 04/18/19 1201  GLUCAP 154* 211* 135* 151* 162*   D-Dimer No results for input(s): DDIMER in the last 72 hours. Hgb A1c No results for input(s): HGBA1C in the last 72 hours. Lipid Profile No results for input(s): CHOL, HDL, LDLCALC, TRIG, CHOLHDL, LDLDIRECT in the last 72 hours. Thyroid function studies Recent Labs    04/16/19 0321  TSH 5.191*   Anemia work up No results for input(s): VITAMINB12, FOLATE, FERRITIN, TIBC, IRON, RETICCTPCT in the last 72 hours. Urinalysis No results found for: COLORURINE, APPEARANCEUR, [place], [place], GLUCOSEU, [place], [place], [place], PROTEINUR, UROBILINOGEN, NITRITE, LEUKOCYTESUR Sepsis Labs Invalid input(s): PROCALCITONIN,  WBC,  LACTICIDVEN  FURTHER DISCHARGE INSTRUCTIONS:   Get Medicines reviewed and adjusted: Please take all your medications with you for your next visit with your Primary MD   Laboratory/radiological data: Please request your Primary MD to go over all hospital tests and procedure/radiological results at the follow up, please ask your Primary MD to get all Hospital records sent to his/her office.   In some cases, they will be blood work, cultures and biopsy results pending at the time of your discharge. Please  request that your primary care M.D. goes through all the records of  your hospital data and follows up on these results.   Also Note the following: If you experience worsening of your admission symptoms, develop shortness of breath, life threatening emergency, suicidal or homicidal thoughts you must seek medical attention immediately by calling 911 or calling your MD immediately  if symptoms less severe.   You must read complete instructions/literature along with all the possible adverse reactions/side effects for all the Medicines you take and that have been prescribed to you. Take any new Medicines after you have completely understood and accpet all the possible adverse reactions/side effects.    Do not drive when taking Pain medications or sleeping medications (Benzodaizepines)   Do not take more than prescribed Pain, Sleep and Anxiety Medications. It is not advisable to combine anxiety,sleep and pain medications without talking with your primary care practitioner   Special Instructions: If you have smoked or chewed Tobacco  in the last 2 yrs please stop smoking, stop any regular Alcohol  and or any Recreational drug use.   Wear Seat belts while driving.   Please note: You were cared for by a hospitalist during your hospital stay. Once you are discharged, your primary care physician will handle any further medical issues. Please note that NO REFILLS for any discharge medications will be authorized once you are discharged, as it is imperative that you return to your primary care physician (or establish a relationship with a primary care physician if you do not have one) for your post hospital discharge needs so that they can reassess your need for medications and monitor your lab values.  Time coordinating discharge: 35 minutes  SIGNED:  Marzetta Board, MD, PhD 04/18/2019, 1:49 PM

## 2019-04-18 NOTE — Discharge Instructions (Signed)
Follow with [name], [name], MD in 5-7 days  Please get a complete blood count and chemistry panel checked by your Primary MD at your next visit, and again as instructed by your Primary MD. Please get your medications reviewed and adjusted by your Primary MD.  Please request your Primary MD to go over all Hospital Tests and Procedure/Radiological results at the follow up, please get all Hospital records sent to your Prim MD by signing hospital release before you go home.  In some cases, there will be blood work, cultures and biopsy results pending at the time of your discharge. Please request that your primary care M.D. goes through all the records of your hospital data and follows up on these results.  If you had Pneumonia of Lung problems at the Hospital: Please get a 2 view Chest X ray done in 6-8 weeks after hospital discharge or sooner if instructed by your Primary MD.  If you have Congestive Heart Failure: Please call your Cardiologist or Primary MD anytime you have any of the following symptoms:  1) 3 pound weight gain in 24 hours or 5 pounds in 1 week  2) shortness of breath, with or without a dry hacking cough  3) swelling in the hands, feet or stomach  4) if you have to sleep on extra pillows at night in order to breathe  Follow cardiac low salt diet and 1.5 lit/day fluid restriction.  If you have diabetes Accuchecks 4 times/day, Once in AM empty stomach and then before each meal. Log in all results and show them to your primary doctor at your next visit. If any glucose reading is under 80 or above 300 call your primary MD immediately.  If you have Seizure/Convulsions/Epilepsy: Please do not drive, operate heavy machinery, participate in activities at heights or participate in high speed sports until you have seen by Primary MD or a Neurologist and advised to do so again.  If you had Gastrointestinal Bleeding: Please ask your Primary MD to check a complete blood count  within one week of discharge or at your next visit. Your endoscopic/colonoscopic biopsies that are pending at the time of discharge, will also need to followed by your Primary MD.  Get Medicines reviewed and adjusted. Please take all your medications with you for your next visit with your Primary MD  Please request your Primary MD to go over all hospital tests and procedure/radiological results at the follow up, please ask your Primary MD to get all Hospital records sent to his/her office.  If you experience worsening of your admission symptoms, develop shortness of breath, life threatening emergency, suicidal or homicidal thoughts you must seek medical attention immediately by calling 911 or calling your MD immediately  if symptoms less severe.  You must read complete instructions/literature along with all the possible adverse reactions/side effects for all the Medicines you take and that have been prescribed to you. Take any new Medicines after you have completely understood and accpet all the possible adverse reactions/side effects.   Do not drive or operate heavy machinery when taking Pain medications.   Do not take more than prescribed Pain, Sleep and Anxiety Medications  Special Instructions: If you have smoked or chewed Tobacco  in the last 2 yrs please stop smoking, stop any regular Alcohol  and or any Recreational drug use.  Wear Seat belts while driving.  Please note You were cared for by a hospitalist during your hospital stay. If you have any questions about your  discharge medications or the care you received while you were in the hospital after you are discharged, you can call the unit and asked to speak with the hospitalist on call if the hospitalist that took care of you is not available. Once you are discharged, your primary care physician will handle any further medical issues. Please note that NO REFILLS for any discharge medications will be authorized once you are discharged,  as it is imperative that you return to your primary care physician (or establish a relationship with a primary care physician if you do not have one) for your aftercare needs so that they can reassess your need for medications and monitor your lab values.  You can reach the hospitalist office at phone [phone] or fax [phone]   If you do not have a primary care physician, you can call [phone] for a physician referral.  Activity: As tolerated with Full fall precautions use walker/cane & assistance as needed    Diet: diabetic  Disposition Home

## 2019-04-18 NOTE — Progress Notes (Signed)
   Subjective:  Patient presents today status post ORIF right great toe fracture. DOS: 04/04/2019.  Patient was just discharged from the hospital today for postoperative infection.  He was discharged on Bactrim and Keflex.  He presents for follow-up treatment evaluation  Past Medical History:  Diagnosis Date  . Anxiety   . Diabetes mellitus without complication ([place])   . HIV (human immunodeficiency virus infection) ([place])   . Hypertension   . Renal disorder       Objective/Physical Exam Neurovascular status intact.  Skin incisions appear to be well coapted with sutures and staples intact.  There is some increased erythema noted encompassing the great toe concerning for possible postoperative cellulitis.  No active bleeding noted. Moderate edema noted to the surgical extremity.  Radiographic Exam:  Percutaneous fixation pin and fracture sites appear to be stable with routine healing.  Assessment: 1. s/p ORIF right great toe. DOS: 04/04/2019 2.  Postoperative cellulitis/infection right foot-improving   Plan of Care:  1. Patient was evaluated.  2.  Dressings were changed today.  Keep clean dry and intact x1 week 3.  Continue minimal weightbearing in the immobilization cam boot 4.  Continue oral Bactrim and Keflex as prescribed post discharge from the hospital 5.  Return to clinic in 1 week  Edrick Kins, DPM [facility]  Dr. Edrick Kins, [address] [place]                                        [place],  [address]                Office [phone]  Fax [phone]

## 2019-04-20 LAB — CULTURE, BLOOD (ROUTINE X 2)
Culture: NO GROWTH
Culture: NO GROWTH
Special Requests: ADEQUATE
Special Requests: ADEQUATE

## 2019-04-24 ENCOUNTER — Other Ambulatory Visit: Payer: Medicare Other | Admitting: Orthotics

## 2019-04-25 ENCOUNTER — Other Ambulatory Visit: Payer: Self-pay

## 2019-04-25 ENCOUNTER — Encounter: Payer: Self-pay | Admitting: Podiatry

## 2019-04-25 ENCOUNTER — Ambulatory Visit (INDEPENDENT_AMBULATORY_CARE_PROVIDER_SITE_OTHER): Payer: Medicare Other | Admitting: Podiatry

## 2019-04-25 VITALS — Temp 97.9°F

## 2019-04-25 DIAGNOSIS — Z09 Encounter for follow-up examination after completed treatment for conditions other than malignant neoplasm: Secondary | ICD-10-CM

## 2019-04-25 DIAGNOSIS — S92401A Displaced unspecified fracture of right great toe, initial encounter for closed fracture: Secondary | ICD-10-CM

## 2019-04-25 NOTE — Progress Notes (Signed)
   Subjective:  Patient presents today status post surgical ORIF right great toe.. DOS: 04/04/2019.  Patient states he is doing much better.  He has been weightbearing in the immobilization cam walker.  He finishes his oral Bactrim and Keflex as prescribed post discharge from the hospital today.  Past Medical History:  Diagnosis Date  . Anxiety   . Diabetes mellitus without complication ([place])   . HIV (human immunodeficiency virus infection) ([place])   . Hypertension   . Renal disorder       Objective/Physical Exam Neurovascular status intact.  Skin incisions appear to be well coapted with  staples intact. No sign of infectious process noted. No dehiscence. No active bleeding noted. Moderate edema noted to the surgical extremity.  Assessment: 1. s/p ORIF right great toe. DOS: 04/04/2019 2.  Postoperative cellulitis/infection right great toe-resolved   Plan of Care:  1. Patient was evaluated. 2.  Skin staples were removed today.  Dry sterile dressing applied.  Keep clean dry and intact x1 week.  Dressing supplies were given to the patient for dressing changes at home 3.  Continue weightbearing in the cam boot 4.  Return to clinic in 2 weeks for percutaneous fixation pin removal   Edrick Kins, DPM [facility]  Dr. Edrick Kins, [address]                                        [place], [place] [address]                Office [phone]  Fax [phone]

## 2019-05-09 ENCOUNTER — Other Ambulatory Visit: Payer: Self-pay

## 2019-05-09 ENCOUNTER — Other Ambulatory Visit: Payer: Self-pay | Admitting: Podiatry

## 2019-05-09 ENCOUNTER — Encounter: Payer: Self-pay | Admitting: Podiatry

## 2019-05-09 ENCOUNTER — Ambulatory Visit (INDEPENDENT_AMBULATORY_CARE_PROVIDER_SITE_OTHER): Payer: Medicare Other

## 2019-05-09 ENCOUNTER — Ambulatory Visit (INDEPENDENT_AMBULATORY_CARE_PROVIDER_SITE_OTHER): Payer: Medicare Other | Admitting: Podiatry

## 2019-05-09 VITALS — Temp 97.7°F

## 2019-05-09 DIAGNOSIS — L03031 Cellulitis of right toe: Secondary | ICD-10-CM

## 2019-05-09 DIAGNOSIS — S92401A Displaced unspecified fracture of right great toe, initial encounter for closed fracture: Secondary | ICD-10-CM | POA: Diagnosis not present

## 2019-05-09 MED ORDER — CEPHALEXIN 500 MG PO CAPS: 500.0000 mg | ORAL_CAPSULE | Freq: Three times a day (TID) | ORAL | 0 refills | Status: DC

## 2019-05-09 MED ORDER — FLUCONAZOLE 150 MG PO TABS: 150.0000 mg | ORAL_TABLET | Freq: Once | ORAL | 0 refills | Status: AC

## 2019-05-09 MED FILL — CEPHALEXIN 500 MG CAPSULE: 500 | 10 days supply | Qty: 30 | Fill #0

## 2019-05-09 MED FILL — FLUCONAZOLE 150 MG TABLET: 150 | 1 days supply | Qty: 1 | Fill #0

## 2019-05-09 NOTE — Progress Notes (Signed)
   Subjective:  Patient presents today status post surgical ORIF right great toe.. DOS: 04/04/2019.  Patient says that he has noticed some increased redness with swelling to the surgical toe over the past week.  He is concerned for recurrent infection.  He is also noticed slight increase in pain.  Past Medical History:  Diagnosis Date  . Anxiety   . Diabetes mellitus without complication ([place])   . HIV (human immunodeficiency virus infection) ([place])   . Hypertension   . Renal disorder       Objective/Physical Exam Neurovascular status intact.  Skin incisions appear to be well coapted with  staples intact.  There does appear to be some increased erythema and edema noted to the surgical toe no dehiscence. No active bleeding noted. Moderate edema noted to the surgical extremity.  Radiographic exam Percutaneous fixation pin appears to be intact without any significant movement of the fragments of the proximal phalanx fracture sites.  Routine healing noted.  Assessment: 1. s/p ORIF right great toe. DOS: 04/04/2019 2.  Postoperative cellulitis/infection right great toe- recurrent   Plan of Care:  1. Patient was evaluated. 2.  Percutaneous fixation pin was removed today.  With removal of the percutaneous fixation pin there was some purulent drainage noted to the pinhole tract site. 3.  Cultures were taken and sent to pathology 4.  Prescription for Keflex 500 mg 3 times daily 5.  Patient also states that with antibiotics she gets yeast infections so we prescribed Diflucan 150 mg #1. 6.  Return to clinic in 2 weeks   Edrick Kins, DPM [facility]  Dr. Edrick Kins, [address] [place]                                        [place],  [address]                Office [phone]  Fax [phone]

## 2019-05-12 LAB — WOUND CULTURE
MICRO NUMBER:: 491972
SPECIMEN QUALITY:: ADEQUATE

## 2019-05-13 ENCOUNTER — Encounter: Payer: Self-pay | Admitting: Sports Medicine

## 2019-05-13 ENCOUNTER — Inpatient Hospital Stay (HOSPITAL_COMMUNITY)
Admission: EM | Admit: 2019-05-13 | Discharge: 2019-05-17 | DRG: 863 | Disposition: A | Discharge status: Home / Self Care | Payer: Medicare Other | Attending: Internal Medicine | Admitting: Internal Medicine

## 2019-05-13 ENCOUNTER — Emergency Department (HOSPITAL_COMMUNITY): Payer: Medicare Other

## 2019-05-13 ENCOUNTER — Other Ambulatory Visit: Payer: Self-pay

## 2019-05-13 ENCOUNTER — Encounter (HOSPITAL_COMMUNITY): Payer: Self-pay

## 2019-05-13 DIAGNOSIS — I129 Hypertensive chronic kidney disease with stage 1 through stage 4 chronic kidney disease, or unspecified chronic kidney disease: Secondary | ICD-10-CM | POA: Diagnosis present

## 2019-05-13 DIAGNOSIS — Z7984 Long term (current) use of oral hypoglycemic drugs: Secondary | ICD-10-CM

## 2019-05-13 DIAGNOSIS — Y831 Surgical operation with implant of artificial internal device as the cause of abnormal reaction of the patient, or of later complication, without mention of misadventure at the time of the procedure: Secondary | ICD-10-CM | POA: Diagnosis present

## 2019-05-13 DIAGNOSIS — N183 Chronic kidney disease, stage 3 (moderate): Secondary | ICD-10-CM | POA: Diagnosis present

## 2019-05-13 DIAGNOSIS — Z21 Asymptomatic human immunodeficiency virus [HIV] infection status: Secondary | ICD-10-CM | POA: Diagnosis present

## 2019-05-13 DIAGNOSIS — F329 Major depressive disorder, single episode, unspecified: Secondary | ICD-10-CM | POA: Diagnosis present

## 2019-05-13 DIAGNOSIS — Z79899 Other long term (current) drug therapy: Secondary | ICD-10-CM | POA: Diagnosis not present

## 2019-05-13 DIAGNOSIS — E1122 Type 2 diabetes mellitus with diabetic chronic kidney disease: Secondary | ICD-10-CM | POA: Diagnosis present

## 2019-05-13 DIAGNOSIS — L03031 Cellulitis of right toe: Secondary | ICD-10-CM | POA: Diagnosis present

## 2019-05-13 DIAGNOSIS — B20 Human immunodeficiency virus [HIV] disease: Secondary | ICD-10-CM | POA: Diagnosis present

## 2019-05-13 DIAGNOSIS — L97511 Non-pressure chronic ulcer of other part of right foot limited to breakdown of skin: Secondary | ICD-10-CM | POA: Diagnosis not present

## 2019-05-13 DIAGNOSIS — E1140 Type 2 diabetes mellitus with diabetic neuropathy, unspecified: Secondary | ICD-10-CM | POA: Diagnosis present

## 2019-05-13 DIAGNOSIS — N400 Enlarged prostate without lower urinary tract symptoms: Secondary | ICD-10-CM | POA: Diagnosis present

## 2019-05-13 DIAGNOSIS — L03115 Cellulitis of right lower limb: Secondary | ICD-10-CM

## 2019-05-13 DIAGNOSIS — N179 Acute kidney failure, unspecified: Secondary | ICD-10-CM | POA: Diagnosis present

## 2019-05-13 DIAGNOSIS — Z20828 Contact with and (suspected) exposure to other viral communicable diseases: Secondary | ICD-10-CM | POA: Diagnosis present

## 2019-05-13 DIAGNOSIS — K219 Gastro-esophageal reflux disease without esophagitis: Secondary | ICD-10-CM | POA: Diagnosis present

## 2019-05-13 DIAGNOSIS — F419 Anxiety disorder, unspecified: Secondary | ICD-10-CM | POA: Diagnosis present

## 2019-05-13 DIAGNOSIS — Z885 Allergy status to narcotic agent status: Secondary | ICD-10-CM | POA: Diagnosis not present

## 2019-05-13 DIAGNOSIS — L97519 Non-pressure chronic ulcer of other part of right foot with unspecified severity: Secondary | ICD-10-CM | POA: Diagnosis not present

## 2019-05-13 DIAGNOSIS — T8141XA Infection following a procedure, superficial incisional surgical site, initial encounter: Principal | ICD-10-CM | POA: Diagnosis present

## 2019-05-13 DIAGNOSIS — E119 Type 2 diabetes mellitus without complications: Secondary | ICD-10-CM

## 2019-05-13 DIAGNOSIS — E0800 Diabetes mellitus due to underlying condition with hyperosmolarity without nonketotic hyperglycemic-hyperosmolar coma (NKHHC): Secondary | ICD-10-CM

## 2019-05-13 DIAGNOSIS — M79671 Pain in right foot: Secondary | ICD-10-CM | POA: Diagnosis present

## 2019-05-13 DIAGNOSIS — L0390 Cellulitis, unspecified: Secondary | ICD-10-CM | POA: Diagnosis present

## 2019-05-13 LAB — BASIC METABOLIC PANEL
Anion gap: 10 (ref 5–15)
BUN: 25 mg/dL — ABNORMAL HIGH (ref 8–23)
CO2: 23 mmol/L (ref 22–32)
Calcium: 9.5 mg/dL (ref 8.9–10.3)
Chloride: 107 mmol/L (ref 98–111)
Creatinine, Ser: 1.27 mg/dL — ABNORMAL HIGH (ref 0.61–1.24)
GFR calc Af Amer: >60 mL/min (ref >60)
GFR calc non Af Amer: 59 mL/min — ABNORMAL LOW (ref >60)
Glucose, Bld: 107 mg/dL — ABNORMAL HIGH (ref 70–99)
Potassium: 3.9 mmol/L (ref 3.5–5.1)
Sodium: 140 mmol/L (ref 135–145)

## 2019-05-13 LAB — CBC
HCT: 41.4 % (ref 39.0–52.0)
Hemoglobin: 13.7 g/dL (ref 13.0–17.0)
MCH: 34.3 pg — ABNORMAL HIGH (ref 26.0–34.0)
MCHC: 33.1 g/dL (ref 30.0–36.0)
MCV: 103.8 fL — ABNORMAL HIGH (ref 80.0–100.0)
Platelets: 154 10*3/uL (ref 150–400)
RBC: 3.99 MIL/uL — ABNORMAL LOW (ref 4.22–5.81)
RDW: 14.5 % (ref 11.5–15.5)
WBC: 5.6 10*3/uL (ref 4.0–10.5)
nRBC: 0 % (ref 0.0–0.2)

## 2019-05-13 LAB — SARS CORONAVIRUS 2 BY RT PCR (HOSPITAL ORDER, PERFORMED IN ~~LOC~~ HOSPITAL LAB): SARS Coronavirus 2: NEGATIVE

## 2019-05-13 LAB — GLUCOSE, CAPILLARY: Glucose-Capillary: 148 mg/dL — ABNORMAL HIGH (ref 70–99)

## 2019-05-13 MED ORDER — BUSPIRONE HCL 5 MG PO TABS
10.0000 mg | ORAL_TABLET | Freq: Three times a day (TID) | ORAL | Status: DC
  Administered 2019-05-14 – 2019-05-17 (×10): 10 mg via ORAL
  Filled 2019-05-13 (×10): qty 2

## 2019-05-13 MED ORDER — PROPRANOLOL HCL ER 80 MG PO CP24
80.0000 mg | ORAL_CAPSULE | Freq: Every day | ORAL | Status: DC
  Administered 2019-05-14 – 2019-05-17 (×4): 80 mg via ORAL
  Filled 2019-05-13 (×4): qty 1

## 2019-05-13 MED ORDER — FENTANYL CITRATE (PF) 100 MCG/2ML IJ SOLN
50.0000 ug | Freq: Once | INTRAMUSCULAR | Status: AC
  Administered 2019-05-13: 50 ug via INTRAVENOUS
  Filled 2019-05-13: qty 2

## 2019-05-13 MED ORDER — PANTOPRAZOLE SODIUM 40 MG PO TBEC
40.0000 mg | DELAYED_RELEASE_TABLET | Freq: Every day | ORAL | Status: DC
  Administered 2019-05-14 – 2019-05-17 (×4): 40 mg via ORAL
  Filled 2019-05-13 (×4): qty 1

## 2019-05-13 MED ORDER — INSULIN ASPART 100 UNIT/ML ~~LOC~~ SOLN
0.0000 [IU] | Freq: Three times a day (TID) | SUBCUTANEOUS | Status: DC
  Administered 2019-05-15: 2 [IU] via SUBCUTANEOUS
  Administered 2019-05-15: 1 [IU] via SUBCUTANEOUS
  Administered 2019-05-16 – 2019-05-17 (×4): 2 [IU] via SUBCUTANEOUS

## 2019-05-13 MED ORDER — HEPARIN SODIUM (PORCINE) 5000 UNIT/ML IJ SOLN
5000.0000 [IU] | Freq: Three times a day (TID) | INTRAMUSCULAR | Status: DC
  Administered 2019-05-13 – 2019-05-14 (×2): 5000 [IU] via SUBCUTANEOUS
  Filled 2019-05-13 (×5): qty 1

## 2019-05-13 MED ORDER — ACETAMINOPHEN 325 MG PO TABS: 650.0000 mg | ORAL_TABLET | Freq: Four times a day (QID) | ORAL | Status: DC | PRN

## 2019-05-13 MED ORDER — TIZANIDINE HCL 2 MG PO TABS
4.0000 mg | ORAL_TABLET | Freq: Every day | ORAL | Status: DC
  Administered 2019-05-13 – 2019-05-16 (×4): 4 mg via ORAL
  Filled 2019-05-13 (×4): qty 2

## 2019-05-13 MED ORDER — SODIUM CHLORIDE 0.9 % IV BOLUS
1000.0000 mL | Freq: Once | INTRAVENOUS | Status: AC
  Administered 2019-05-13: 1000 mL via INTRAVENOUS

## 2019-05-13 MED ORDER — INSULIN ASPART 100 UNIT/ML ~~LOC~~ SOLN: 0.0000 [IU] | Freq: Every day | SUBCUTANEOUS | Status: DC

## 2019-05-13 MED ORDER — MIRABEGRON ER 25 MG PO TB24
50.0000 mg | ORAL_TABLET | Freq: Every evening | ORAL | Status: DC
  Administered 2019-05-14 – 2019-05-16 (×3): 50 mg via ORAL
  Filled 2019-05-13 (×3): qty 2

## 2019-05-13 MED ORDER — TOPIRAMATE 25 MG PO TABS
50.0000 mg | ORAL_TABLET | Freq: Two times a day (BID) | ORAL | Status: DC
  Administered 2019-05-14 – 2019-05-17 (×7): 50 mg via ORAL
  Filled 2019-05-13 (×8): qty 2

## 2019-05-13 MED ORDER — HYDROCODONE-ACETAMINOPHEN 5-325 MG PO TABS
1.0000 | ORAL_TABLET | ORAL | Status: DC | PRN
  Administered 2019-05-13 – 2019-05-17 (×12): 2 via ORAL
  Filled 2019-05-13 (×13): qty 2

## 2019-05-13 MED ORDER — GABAPENTIN 300 MG PO CAPS
600.0000 mg | ORAL_CAPSULE | Freq: Three times a day (TID) | ORAL | Status: DC
  Administered 2019-05-13 – 2019-05-17 (×11): 600 mg via ORAL
  Filled 2019-05-13 (×11): qty 2

## 2019-05-13 MED ORDER — ACETAMINOPHEN 650 MG RE SUPP: 650.0000 mg | Freq: Four times a day (QID) | RECTAL | Status: DC | PRN

## 2019-05-13 MED ORDER — DULOXETINE HCL 30 MG PO CPEP
30.0000 mg | ORAL_CAPSULE | Freq: Every evening | ORAL | Status: DC
  Administered 2019-05-13 – 2019-05-16 (×4): 30 mg via ORAL
  Filled 2019-05-13 (×4): qty 1

## 2019-05-13 MED ORDER — ONDANSETRON HCL 4 MG PO TABS: 4.0000 mg | ORAL_TABLET | Freq: Four times a day (QID) | ORAL | Status: DC | PRN

## 2019-05-13 MED ORDER — TAMSULOSIN HCL 0.4 MG PO CAPS
0.4000 mg | ORAL_CAPSULE | Freq: Every day | ORAL | Status: DC
  Administered 2019-05-14 – 2019-05-17 (×4): 0.4 mg via ORAL
  Filled 2019-05-13 (×4): qty 1

## 2019-05-13 MED ORDER — ONDANSETRON HCL 4 MG/2ML IJ SOLN: 4.0000 mg | Freq: Four times a day (QID) | INTRAMUSCULAR | Status: DC | PRN

## 2019-05-13 MED ORDER — VANCOMYCIN HCL 10 G IV SOLR
2000.0000 mg | Freq: Once | INTRAVENOUS | Status: AC
  Administered 2019-05-13: 2000 mg via INTRAVENOUS
  Filled 2019-05-13: qty 2000

## 2019-05-13 MED ORDER — VITAMIN B-12 1000 MCG PO TABS
5000.0000 ug | ORAL_TABLET | Freq: Every day | ORAL | Status: DC
  Administered 2019-05-14 – 2019-05-17 (×4): 5000 ug via ORAL
  Filled 2019-05-13 (×5): qty 5

## 2019-05-13 MED ORDER — ABACAVIR-DOLUTEGRAVIR-LAMIVUD 600-50-300 MG PO TABS
1.0000 | ORAL_TABLET | Freq: Every day | ORAL | Status: DC
  Administered 2019-05-13 – 2019-05-16 (×4): 1 via ORAL
  Filled 2019-05-13 (×5): qty 1

## 2019-05-13 MED ORDER — SODIUM CHLORIDE 0.9 % IV SOLN
INTRAVENOUS | Status: DC
  Administered 2019-05-13: 21:00:00 via INTRAVENOUS

## 2019-05-13 MED ORDER — HYDROXYZINE HCL 25 MG PO TABS: 50.0000 mg | ORAL_TABLET | Freq: Every day | ORAL | Status: DC | PRN

## 2019-05-13 NOTE — Progress Notes (Signed)
Pt new admit from ED alert and oriented with right great toe wound, swelling, warm to touch, open to air,ambulatory, no complain of pain at this time.

## 2019-05-13 NOTE — H&P (Addendum)
History and Physical        Hospital Admission Note Date: 05/13/2019  Patient name: Brian Malone Medical record number: [id number] Date of birth: Feb 17, 1951 Age: 68 y.o. Gender: male  PCP: Doreatha Lew, MD    Patient coming from: Home  I have reviewed all records in the [facility].    Chief Complaint:  Right foot and great toe infection worsening in the last 3 days  HPI: Patient is a 68 year old male with history of HIV, diabetes mellitus, NIDDM, anxiety, CKD stage III, neuropathy presented to ED with right foot and great toe wound infection and pain worsening in the last 3 days.  And, he had injury to his right great toe/foot with a mechanical fall in 03/2019, underwent ORIF of the right great toe on 4/15.  Patient was recently admitted on 4/26 - 4/29 for the possible infection of the surgical toe, was placed on IV antibiotics, then discharged on Bactrim and Keflex.  Patient followed up with his podiatrist, Dr. Amalia Hailey, last seen on 05/09/2019, patient was noticed to have increased swelling and redness to the surgical toe in the past week.  Due to concern for recurrent infection, he was placed on oral Keflex. Patient reports that despite taking Keflex for the past 5 days, he has been having worsening pain, redness to the right toe spreading to the right foot and ankle, purulent drainage from the top of the toe.  He reported occasional shaking chills, generalized malaise otherwise denied any nausea vomiting, chest pain or shortness of breath.  ED work-up/course:  98, respiratory 17, pulse 78, BP 139/85, O2 sats 100% on room air Right foot x-ray showed no acute findings, no convincing evidence of osteomyelitis, no soft tissue gas.  Healing fracture of the proximal phalanx of the right great toe. Right tib-fib x-ray showed no acute fracture dislocation of the right  tibia and fibula, plate and screw fixation of the distal right fibula.  Soft tissue edema about the right leg.  Review of Systems: Positives marked in 'bold' Constitutional: Denies fever, diaphoresis, poor appetite and fatigue. + Chills HEENT: Denies photophobia, eye pain, redness, hearing loss, ear pain, congestion, sore throat, rhinorrhea, sneezing, mouth sores, trouble swallowing, neck pain, neck stiffness and tinnitus.   Respiratory: Denies SOB, DOE, cough, chest tightness,  and wheezing.   Cardiovascular: Denies chest pain, palpitations and leg swelling.  Gastrointestinal: Denies nausea, vomiting, abdominal pain, diarrhea, constipation, blood in stool and abdominal distention.  Genitourinary: Denies dysuria, urgency, frequency, hematuria, flank pain and difficulty urinating.  Musculoskeletal: See HPI Skin: See HPI Neurological: Denies dizziness, seizures, syncope, weakness, light-headedness, numbness and headaches.  Hematological: Denies adenopathy. Easy bruising, personal or family bleeding history  Psychiatric/Behavioral: Denies suicidal ideation, mood changes, confusion, nervousness, sleep disturbance and agitation  Past Medical History: Past Medical History:  Diagnosis Date  . Anxiety   . Diabetes mellitus without complication ([place])   . HIV (human immunodeficiency virus infection) ([place])   . Hypertension   . Renal disorder     Past Surgical History:  Procedure Laterality Date  . ANKLE ARTHROSCOPY      Medications: Prior to Admission medications   Medication Sig Start  Date End Date Taking? Authorizing Provider  acetaminophen (TYLENOL) 500 MG tablet Take 1,000 mg by mouth 3 (three) times daily.   Yes [provider]  busPIRone (BUSPAR) 10 MG tablet Take 10 mg by mouth 3 (three) times daily.  01/22/19  Yes [provider]  cephALEXin (KEFLEX) 500 MG capsule Take 1 capsule (500 mg total) by mouth 3 (three) times daily. 05/09/19  Yes Edrick Kins, DPM   Cyanocobalamin (VITAMIN B-12) 5000 MCG TBDP Take 5,000 mcg by mouth daily.    Yes [provider]  DULoxetine (CYMBALTA) 30 MG capsule Take 30 mg by mouth every evening.  01/22/19  Yes [provider]  gabapentin (NEURONTIN) 300 MG capsule Take 1 capsule (300 mg total) by mouth 3 (three) times daily. Patient taking differently: Take 600 mg by mouth 3 (three) times daily.  02/10/19  Yes Carmin Muskrat, MD  glipiZIDE (GLUCOTROL) 5 MG tablet Take 5 mg by mouth 2 (two) times a day.    Yes [provider]  hydrOXYzine (VISTARIL) 50 MG capsule Take 50 mg by mouth daily as needed for anxiety or itching.  01/22/19  Yes [provider]  metFORMIN (GLUCOPHAGE) 500 MG tablet Take 500 mg by mouth 2 (two) times a day.  02/22/19 05/23/19 Yes [provider]  mirabegron ER (MYRBETRIQ) 50 MG TB24 tablet Take 50 mg by mouth every evening.    Yes [provider]  pantoprazole (PROTONIX) 40 MG tablet Take 40 mg by mouth daily.  02/12/19  Yes [provider]  propranolol ER (INDERAL LA) 80 MG 24 hr capsule Take 80 mg by mouth daily.  02/12/19  Yes [provider]  rizatriptan (MAXALT) 10 MG tablet Take 10 mg by mouth as needed for migraine.  03/21/19  Yes [provider]  tamsulosin (FLOMAX) 0.4 MG CAPS capsule Take 0.4 mg by mouth daily.  02/12/19  Yes [provider]  tiZANidine (ZANAFLEX) 4 MG tablet Take 4 mg by mouth at bedtime.  03/08/19  Yes [provider]  topiramate (TOPAMAX) 50 MG tablet Take 50 mg by mouth 2 (two) times daily.  03/21/19  Yes [provider]  TRIUMEQ 600-50-300 MG tablet Take 1 tablet by mouth daily.  02/12/19  Yes [provider]  Vitamin D, Ergocalciferol, (DRISDOL) 1.25 MG (50000 UT) CAPS capsule Take 50,000 Units by mouth every 7 (seven) days.  03/21/19  Yes [provider]  oxyCODONE (ROXICODONE) 5 MG immediate release tablet Take 1 tablet (5 mg total) by mouth every 6 (six) hours  as needed for severe pain. Patient not taking: Reported on 05/13/2019 04/09/19   Edrick Kins, DPM  sulfamethoxazole-trimethoprim (BACTRIM DS) 800-160 MG tablet Take 1 tablet by mouth 2 (two) times daily. Patient not taking: Reported on 05/13/2019 04/18/19   Caren Griffins, MD    Allergies:   Allergies  Allergen Reactions  . Morphine Other (See Comments)    Itching     Social History:  reports that he has never smoked. He has never used smokeless tobacco. He reports that he does not drink alcohol or use drugs.  Family History: Family History  Problem Relation Age of Onset  . CAD Mother   . Lung cancer Mother   . CAD Father   . Lung cancer Father   . CAD Brother     Physical Exam: Blood pressure (!) 143/84, pulse 70, temperature 98 F (36.7 C), temperature source Oral, resp. rate 18, height 6' (1.829 m), weight 106.6  kg, SpO2 100 %. General: Alert, awake, oriented x3, in no acute distress. Eyes: pink conjunctiva,anicteric sclera, pupils equal and reactive to light and accomodation, HEENT: normocephalic, atraumatic, oropharynx clear Neck: supple, no masses or lymphadenopathy, no goiter, no bruits, no JVD CVS: Regular rate and rhythm, without murmurs, rubs or gallops. No lower extremity edema Resp : Clear to auscultation bilaterally, no wheezing, rales or rhonchi. GI : Soft, nontender, nondistended, positive bowel sounds, no masses. No hepatomegaly. No hernia.  Musculoskeletal: No clubbing or cyanosis, positive pedal pulses. No contracture. ROM intact  Neuro: Grossly intact, no focal neurological deficits, strength 5/5 upper and lower extremities bilaterally Psych: alert and oriented x 3, normal mood and affect Skin: Erythema, warmth and tenderness to the right great toe with a small wound on the plantar aspect of the great toe, tender  LABS on Admission: I have personally reviewed all the labs and imagings below    Basic Metabolic Panel: Recent Labs  Lab 05/13/19 1435   NA 140  K 3.9  CL 107  CO2 23  GLUCOSE 107*  BUN 25*  CREATININE 1.27*  CALCIUM 9.5   Liver Function Tests: No results for input(s): AST, ALT, ALKPHOS, BILITOT, PROT, ALBUMIN in the last 168 hours. No results for input(s): LIPASE, AMYLASE in the last 168 hours. No results for input(s): AMMONIA in the last 168 hours. CBC: Recent Labs  Lab 05/13/19 1435  WBC 5.6  HGB 13.7  HCT 41.4  MCV 103.8*  PLT 154   Cardiac Enzymes: No results for input(s): CKTOTAL, CKMB, CKMBINDEX, TROPONINI in the last 168 hours. BNP: Invalid input(s): POCBNP CBG: No results for input(s): GLUCAP in the last 168 hours.  Radiological Exams on Admission:  Dg Tibia/fibula Right  Result Date: 05/13/2019 CLINICAL DATA:  Pain EXAM: RIGHT TIBIA AND FIBULA - 2 VIEW COMPARISON:  None. FINDINGS: No acute fracture or dislocation of the right tibia or fibula. There is plate and screw fixation of the distal right fibula. The partially imaged right knee and right ankle are unremarkable. Soft tissue edema about the right leg. IMPRESSION: No acute fracture or dislocation of the right tibia or fibula. There is plate and screw fixation of the distal right fibula. The partially imaged right knee and right ankle are unremarkable. Soft tissue edema about the right leg. Electronically Signed   By: Eddie Candle M.D.   On: 05/13/2019 14:25   Dg Foot Complete Right  Result Date: 05/13/2019 CLINICAL DATA:  RIGHT foot pain onset 6 weeks ago. History of fracture with infection treated with IV antibiotics about 4 weeks ago. EXAM: RIGHT FOOT COMPLETE - 3+ VIEW COMPARISON:  Plain film of the RIGHT foot dated 05/09/2019. FINDINGS: Fixation pin has been removed from the great toe. Associated healing fracture of the proximal phalanx with stable alignment compared to the previous plain film of 05/09/2019. Distal phalanx appears intact and normal in mineralization. Remainder of the osseous structures of the RIGHT foot appear intact and normal  in mineralization. Soft tissues about the RIGHT foot are unremarkable. No soft tissue gas seen. IMPRESSION: No acute findings. No convincing evidence of osteomyelitis. No soft tissue gas. Healing fracture of the proximal phalanx of the RIGHT great toe, with stable alignment, status post fixation pin removal since plain film of 05/09/2019. Electronically Signed   By: Franki Cabot M.D.   On: 05/13/2019 14:23      EKG: Independently reviewed.  Rate 71, normal sinus rhythm   Assessment/Plan Principal Problem: Right great toe cellulitis with  infection, surgical toe, recurrent: Outpatient failure to oral antibiotics -X-rays of the right foot, negative for osteomyelitis -Due to worsening of pain, cellulitis, draining of the wound, considered outpatient failure to oral antibiotics/Keflex, patient will be admitted and placed on IV antibiotics -Placed on IV vancomycin and Zosyn (patient had not tolerated Flagyl during previous admission due to nausea) -No systemic signs of sepsis, continued IV fluids, pain control -Called patient's podiatrist, Dr. Amalia Hailey office, spoke with on-call physician, Dr. Becky Augusta for inpatient consultation.  Dr. Amalia Hailey will follow during hospitalization.  Active Problems:   Diabetes mellitus type II, NIDDM ([facility]) -Hold oral hypoglycemics -Obtain hemoglobin A1c, placed on sliding scale insulin    HIV (human immunodeficiency virus infection) ([facility]) -Continue Triumeq  GERD Continue PPI  Hypertension Continue Inderal  Anxiety, depression Continue Cymbalta, BuSpar, Vistaril as needed   DVT prophylaxis: Heparin subcu  CODE STATUS: Full code  Consults called: Podiatry  Family Communication: Admission, patients condition and plan of care including tests being ordered have been discussed with the patient  who indicates understanding and agree with the plan and Code Status  Admission status: inpatient, med surg  Disposition plan: Further plan will depend as  patient's clinical course evolves and further radiologic and laboratory data become available.    At the time of admission, it appears that the appropriate admission status for this patient is INPATIENT . This is judged to be reasonable and necessary in order to provide the required intensity of service to ensure the patient's safety given the presenting symptoms Right foot cellulitis, wound infection in the surgical toe, physical exam findings right foot cellulitis, erythema, drainage, and initial radiographic and laboratory data in the context of their chronic comorbidities.  The medical decision making on this patient was of high complexity and the patient is at high risk for clinical deterioration, therefore this is a level 3 visit   Time Spent on Admission: 70 minutes      M.D. [facility] 05/13/2019, 5:49 PM

## 2019-05-13 NOTE — ED Triage Notes (Signed)
Pt arrives POV for eval of R foot pain onset 6 weeks ago. Pt reports he had fracture w/ infection, was treated w/ IV abx about 4 weeks ago. Pt reports he restarted on abx 5 days PTA, states that his on call physician advised he present here for further treatment. No streaking, noted up leg, foot is warm to touch, well perfused. Redness noted.

## 2019-05-13 NOTE — Progress Notes (Signed)
Patient called answering service and reports since he was seen on Wednesday by Dr. Amalia Hailey and put on cipro his foot has continued to hurt, swell, and has become red with warmth and cellulitis like he has had before when he had past infection. Patient states that he is starting to feel bad with 1 episode of fever. I reviewed his culture from 5/20 which shows normal skin flora. I advised patient that because his PO antibiotics are not working. He should report to the ER for further evaluation for cellulitis. Patient expressed understanding and thanked me for taking his call. Dr. Cannon Kettle

## 2019-05-13 NOTE — ED Notes (Signed)
Patient transported to X-ray 

## 2019-05-13 NOTE — ED Provider Notes (Signed)
[facility] Emergency Department Provider Note MRN:  [id number]  Arrival date & time: 05/13/19     Chief Complaint   Foot Pain   History of Present Illness   Brian Malone is a 68 y.o. year-old male with a history of HIV, diabetes presenting to the ED with chief complaint of foot pain.  Patient explains that he fell and broke his foot last month, requiring surgical repair with pin and hardware placement.  Unfortunately he developed a postoperative infection and was admitted in late April and placed on antibiotics.  About a week ago, he presented to his surgeon's office for evaluation, and they removed 1 of the pins in his right great toe and he explains that a copious amount of purulent material exited the wound.  He was then placed on Keflex and has been taking that for the past 5 days.  He explains that he has only been experiencing worsening symptoms since that time, increased pain and redness to the right toes spreading to the right foot and ankle.  Denies fever but is experiencing occasional shaking chills, experiencing general malaise, denies nausea or vomiting, no chest pain or shortness of breath, no abdominal pain.  Pain is moderate in severity, worse with palpation or movement.  Review of Systems  A complete 10 system review of systems was obtained and all systems are negative except as noted in the HPI and PMH.   Patient's Health History    Past Medical History:  Diagnosis Date  . Anxiety   . Diabetes mellitus without complication ([place])   . HIV (human immunodeficiency virus infection) ([place])   . Hypertension   . Renal disorder     Past Surgical History:  Procedure Laterality Date  . ANKLE ARTHROSCOPY      Family History  Problem Relation Age of Onset  . CAD Mother   . Lung cancer Mother   . CAD Father   . Lung cancer Father   . CAD Brother     Social History   Socioeconomic History  . Marital status: Divorced    Spouse name: Not on file  .  Number of children: Not on file  . Years of education: Not on file  . Highest education level: Not on file  Occupational History  . Not on file  Social Needs  . Financial resource strain: Not on file  . Food insecurity:    Worry: Not on file    Inability: Not on file  . Transportation needs:    Medical: Not on file    Non-medical: Not on file  Tobacco Use  . Smoking status: Never Smoker  . Smokeless tobacco: Never Used  Substance and Sexual Activity  . Alcohol use: Never    Frequency: Never  . Drug use: Never  . Sexual activity: Not on file  Lifestyle  . Physical activity:    Days per week: Not on file    Minutes per session: Not on file  . Stress: Not on file  Relationships  . Social connections:    Talks on phone: Not on file    Gets together: Not on file    Attends religious service: Not on file    Active member of club or organization: Not on file    Attends meetings of clubs or organizations: Not on file    Relationship status: Not on file  . Intimate partner violence:    Fear of current or ex partner: Not on file  Emotionally abused: Not on file    Physically abused: Not on file    Forced sexual activity: Not on file  Other Topics Concern  . Not on file  Social History Narrative  . Not on file     Physical Exam  Vital Signs and Nursing Notes reviewed Vitals:   05/13/19 1745 05/13/19 1803  BP: (!) 143/84 (!) 151/89  Pulse: 70 67  Resp: 18 16  Temp:  97.9 F (36.6 C)  SpO2: 100% 100%    CONSTITUTIONAL: Well-appearing, NAD NEURO:  Alert and oriented x 3, no focal deficits EYES:  eyes equal and reactive ENT/NECK:  no LAD, no JVD CARDIO: Regular rate, well-perfused, normal S1 and S2 PULM:  CTAB no wheezing or rhonchi GI/GU:  normal bowel sounds, non-distended, non-tender MSK/SPINE:  No gross deformities, no edema SKIN: Erythema and induration to the right great toe with a small wound to the plantar aspect of the great toe at the DIP. PSYCH:   Appropriate speech and behavior  Diagnostic and Interventional Summary    EKG Interpretation  Date/Time:  Sunday May 13 2019 14:51:53 EDT Ventricular Rate:  71 PR Interval:    QRS Duration: 97 QT Interval:  379 QTC Calculation: 412 R Axis:   -10 Text Interpretation:  Sinus rhythm Confirmed by Gerlene Fee [phone]) on 05/13/2019 3:51:34 PM      Labs Reviewed  CBC - Abnormal; Notable for the following components:      Result Value   RBC 3.99 (*)    MCV 103.8 (*)    MCH 34.3 (*)    All other components within normal limits  BASIC METABOLIC PANEL - Abnormal; Notable for the following components:   Glucose, Bld 107 (*)    BUN 25 (*)    Creatinine, Ser 1.27 (*)    GFR calc non Af Amer 59 (*)    All other components within normal limits  SARS CORONAVIRUS 2 (HOSPITAL ORDER, [place] [facility])  CULTURE, BLOOD (SINGLE)    DG Foot Complete Right  Final Result    DG Tibia/Fibula Right  Final Result      Medications  vancomycin (VANCOCIN) 2,000 mg in sodium chloride 0.9 % 500 mL IVPB (0 mg Intravenous Stopped 05/13/19 1727)  sodium chloride 0.9 % bolus 1,000 mL (1,000 mLs Intravenous New Bag/Given 05/13/19 1455)  fentaNYL (SUBLIMAZE) injection 50 mcg (50 mcg Intravenous Given 05/13/19 1457)     Procedures Critical Care  ED Course and Medical Decision Making  I have reviewed the triage vital signs and the nursing notes.  Pertinent labs & imaging results that were available during my care of the patient were reviewed by me and considered in my medical decision making (see below for details).  Anticipating admission for cellulitis that has failed outpatient therapy in this 68 year old male with a history of diabetes and HIV.  Currently without fever, no tachycardia, no objective signs of systemic infections.  Will provide fluids, vancomycin.  Admitted to hospitalist service for further care.  Barth Kirks. [place] Small, [place] [email or web address]  Final Clinical Impressions(s) / ED Diagnoses     ICD-10-CM   1. Cellulitis of right lower extremity L03.115     ED Discharge Orders    None         Maudie Flakes, MD 05/13/19 [phone]

## 2019-05-14 ENCOUNTER — Encounter (HOSPITAL_COMMUNITY): Payer: Self-pay | Admitting: General Practice

## 2019-05-14 ENCOUNTER — Telehealth: Payer: Self-pay | Admitting: Podiatry

## 2019-05-14 LAB — GLUCOSE, CAPILLARY
Glucose-Capillary: 141 mg/dL — ABNORMAL HIGH (ref 70–99)
Glucose-Capillary: 136 mg/dL — ABNORMAL HIGH (ref 70–99)
Glucose-Capillary: 125 mg/dL — ABNORMAL HIGH (ref 70–99)
Glucose-Capillary: 122 mg/dL — ABNORMAL HIGH (ref 70–99)

## 2019-05-14 LAB — BASIC METABOLIC PANEL
Anion gap: 7 (ref 5–15)
BUN: 20 mg/dL (ref 8–23)
CO2: 22 mmol/L (ref 22–32)
Calcium: 8.5 mg/dL — ABNORMAL LOW (ref 8.9–10.3)
Chloride: 109 mmol/L (ref 98–111)
Creatinine, Ser: 1.21 mg/dL (ref 0.61–1.24)
GFR calc Af Amer: >60 mL/min (ref >60)
GFR calc non Af Amer: >60 mL/min (ref >60)
Glucose, Bld: 179 mg/dL — ABNORMAL HIGH (ref 70–99)
Potassium: 4.6 mmol/L (ref 3.5–5.1)
Sodium: 138 mmol/L (ref 135–145)

## 2019-05-14 LAB — CBC
HCT: 34.4 % — ABNORMAL LOW (ref 39.0–52.0)
Hemoglobin: 11.4 g/dL — ABNORMAL LOW (ref 13.0–17.0)
MCH: 34.7 pg — ABNORMAL HIGH (ref 26.0–34.0)
MCHC: 33.1 g/dL (ref 30.0–36.0)
MCV: 104.6 fL — ABNORMAL HIGH (ref 80.0–100.0)
Platelets: 128 10*3/uL — ABNORMAL LOW (ref 150–400)
RBC: 3.29 MIL/uL — ABNORMAL LOW (ref 4.22–5.81)
RDW: 14.6 % (ref 11.5–15.5)
WBC: 4 10*3/uL (ref 4.0–10.5)
nRBC: 0 % (ref 0.0–0.2)

## 2019-05-14 LAB — SEDIMENTATION RATE: Sed Rate: 40 mm/hr — ABNORMAL HIGH (ref 0–16)

## 2019-05-14 LAB — C-REACTIVE PROTEIN: CRP: 3.1 mg/dL — ABNORMAL HIGH (ref <1.0)

## 2019-05-14 MED ORDER — VANCOMYCIN HCL 10 G IV SOLR
1500.0000 mg | INTRAVENOUS | Status: DC
  Administered 2019-05-14 – 2019-05-15 (×2): 1500 mg via INTRAVENOUS
  Filled 2019-05-14 (×3): qty 1500

## 2019-05-14 MED ORDER — PIPERACILLIN-TAZOBACTAM 3.375 G IVPB
3.3750 g | Freq: Three times a day (TID) | INTRAVENOUS | Status: DC
  Administered 2019-05-14 – 2019-05-16 (×6): 3.375 g via INTRAVENOUS
  Filled 2019-05-14 (×6): qty 50

## 2019-05-14 NOTE — Consult Note (Signed)
   HPI: 68 year old male history of HIV, diabetes mellitus presented to the ED secondary to cellulitis to the right foot on 05/13/2019.  Patient admitted.  Patient underwent surgical ORIF of the right great toe on 04/04/2019.  Patient was last seen in my office on 05/09/2019.  At that time there was some erythema with redness noted to the toe and oral Keflex 500 mg 3 times daily was prescribed.  Patient called the on-call podiatry and the on-call podiatrist recommended going to the emergency department for IV antibiotics.  Patient was subsequently admitted on 05/13/2019.  Patient says that he is feeling much better over the last 24 hours and the appearance of the toe seems to have improved.  Past Medical History:  Diagnosis Date  . Anxiety   . Diabetes mellitus without complication ([place])   . HIV (human immunodeficiency virus infection) ([place])   . Hypertension   . Renal disorder    CBC    Component Value Date/Time   WBC 4.0 05/14/2019 0419   RBC 3.29 (L) 05/14/2019 0419   HGB 11.4 (L) 05/14/2019 0419   HCT 34.4 (L) 05/14/2019 0419   PLT 128 (L) 05/14/2019 0419   MCV 104.6 (H) 05/14/2019 0419   MCH 34.7 (H) 05/14/2019 0419   MCHC 33.1 05/14/2019 0419   RDW 14.6 05/14/2019 0419   LYMPHSABS 1.0 04/15/2019 1540   MONOABS 0.9 04/15/2019 1540   EOSABS 0.2 04/15/2019 1540   BASOSABS 0.0 04/15/2019 1540    Physical Exam: General: The patient is alert and oriented x3 in no acute distress.  Dermatology: Skin is warm, dry and supple bilateral lower extremities.  Surgical incision appears mostly healed.  There is a small focal superficial wound along the incision site that appears stable without any drainage.  Vascular: Palpable pedal pulses bilaterally.  Moderate edema noted specifically to the right great toe. Capillary refill within normal limits.  Neurological: Epicritic and protective threshold diminished bilaterally.   Musculoskeletal Exam: Status post ORIF right great toe. DOS: 04/04/2019  Radiographic Exam taken 05/13/2019:  No acute findings. No convincing evidence of osteomyelitis. No soft tissue gas. Healing fracture of the proximal phalanx of the RIGHT great toe, with stable alignment, status post fixation pin removal since plain film of 05/09/2019.  I personally reviewed the x-rays and do not see any abscess or collection of fluid.  Assessment: -Status post ORIF right great toe. DOS 04/04/2019 -Right foot cellulitis   Plan of Care:  -Patient was evaluated at bedside today. -From a podiatry standpoint recommend continued IV antibiotics.  No surgical intervention is warranted at this time. -Blood cultures pending -Once the cellulitis is improved recommend discharge with oral antibiotics again. -Follow-up in the office within 1 week of discharge from the hospital. -Thank you for the consult, if you have any additional questions please feel free to contact me directly.     Edrick Kins, [place] Cell: [phone]  Dr. Edrick Kins, DPM    [address], [place] [address]                Office [phone]  Fax [phone]

## 2019-05-14 NOTE — Progress Notes (Signed)
Pharmacy Antibiotic Note  Brian Malone is a 68 y.o. male admitted on 05/13/2019 with wound infection.  Pharmacy has been consulted for Vancomycin & Zosyn dosing.  Plan: Patient received Vancomycin 2gm x1 in ED 5/24, no further abx till 5/25 (progress note states patient on Vanc & Zosyn 5/25 begin Zosyn 3.375gm q8 - 4 hr infusion Continue Vancomycin 1500mg  q24, AUC 468, using SCr 1.21, Vd 0.5  Height: 6' (182.9 cm) Weight: 235 lb (106.6 kg) IBW/kg (Calculated) : 77.6  Temp (24hrs), Avg:97.8 F (36.6 C), Min:97.6 F (36.4 C), Max:97.9 F (36.6 C)  Recent Labs  Lab 05/13/19 1435 05/14/19 0419  WBC 5.6 4.0  CREATININE 1.27* 1.21    Estimated Creatinine Clearance: 76.8 mL/min (by C-G formula based on SCr of 1.21 mg/dL).    Allergies  Allergen Reactions  . Morphine Other (See Comments)    Itching     Antimicrobials this admission: 5/24 Vanc >>  5/25 Zosyn >>  Cont PTA Triumeq  Dose adjustments this admission:  Microbiology results: 5/24 BCx: sent 5/24 Covid: neg  Prev Cx 5/20 WoundCx RLE: no growth final 4/26 BCx: ng-final 4/26 Covid: neg  Thank you for allowing pharmacy to be a part of this patient's care.  Minda Ditto PharmD Phone [phone] 05/14/2019 2:19 PM

## 2019-05-14 NOTE — Progress Notes (Signed)
PROGRESS NOTE    Brian Malone  KZS:[id number] DOB: [birth year] DOA: 05/13/2019 PCP: Brian Lew, MD   Brief Narrative:  Patient is Brian Malone 68 year old male with history of HIV, diabetes mellitus, NIDDM, anxiety, CKD stage III, neuropathy presented to ED with right foot and great toe wound infection and pain worsening in the last 3 days.  And, he had injury to his right great toe/foot with Brian Malone mechanical fall in 03/2019, underwent ORIF of the right great toe on 4/15.  Patient was recently admitted on 4/26 - 4/29 for the possible infection of the surgical toe, was placed on IV antibiotics, then discharged on Bactrim and Keflex.  Patient followed up with his podiatrist, Dr. Amalia Malone, last seen on 05/09/2019, patient was noticed to have increased swelling and redness to the surgical toe in the past week.  Due to concern for recurrent infection, he was placed on oral Keflex. Patient reports that despite taking Keflex for the past 5 days, he has been having worsening pain, redness to the right toe spreading to the right foot and ankle, purulent drainage from the top of the toe.  He reported occasional shaking chills, generalized malaise otherwise denied any nausea vomiting, chest pain or shortness of breath.  Assessment & Plan:   Principal Problem:   Right foot ulcer ([place]) Active Problems:   Diabetes ([place])   Cellulitis   HIV (human immunodeficiency virus infection) ([place])   Right great toe cellulitis with infection, surgical toe, recurrent: Outpatient failure to oral antibiotics -X-rays of the right foot, negative for osteomyelitis -Due to worsening of pain, cellulitis, draining of the wound, considered outpatient failure to oral antibiotics/Keflex, patient will be admitted and placed on IV antibiotics -Currently on vanc/zosyn (apparently did not tolerate flagyl at previous admission) - Blood cx pending from 5/24 - ABI's, palpable distal pulses -Podiatry consult  Diabetes mellitus type II,  NIDDM ([place]) -Hold oral hypoglycemics -A1c -SSI  HIV (human immunodeficiency virus infection) ([place]) -Continue Triumeq -HIV RNA <20 01/19/2019 per care everywhere  GERD Continue PPI  Hypertension Continue Inderal  Anxiety, depression Continue Cymbalta, BuSpar, Vistaril as needed  DVT prophylaxis: (heparin Code Status: full  Family Communication: none at bedside Disposition Plan: pending eval by podiatry   Consultants:   podiatry  Procedures:   none  Antimicrobials:  Anti-infectives (From admission, onward)   Start     Dose/Rate Route Frequency Ordered Stop   05/14/19 1600  vancomycin (VANCOCIN) 1,500 mg in sodium chloride 0.9 % 500 mL IVPB     1,500 mg 250 mL/hr over 120 Minutes Intravenous Every 24 hours 05/14/19 1424     05/14/19 1500  piperacillin-tazobactam (ZOSYN) IVPB 3.375 g     3.375 g 12.5 mL/hr over 240 Minutes Intravenous Every 8 hours 05/14/19 1420     05/13/19 2000  abacavir-dolutegravir-lamiVUDine (TRIUMEQ) 600-50-300 MG per tablet 1 tablet     1 tablet Oral Daily 05/13/19 1953     05/13/19 1400  vancomycin (VANCOCIN) 2,000 mg in sodium chloride 0.9 % 500 mL IVPB     2,000 mg 250 mL/hr over 120 Minutes Intravenous  Once 05/13/19 1344 05/13/19 1727     Subjective: Presented due to R first toe pain  Objective: Vitals:   05/13/19 1803 05/13/19 2024 05/14/19 0453 05/14/19 1244  BP: (!) 151/89 (!) 147/92 120/76 (!) 151/89  Pulse: 67 75 63 67  Resp: 16 18 18    Temp: 97.9 F (36.6 C) 97.8 F (36.6 C) 97.6 F (36.4 C) 97.9 F (36.6 C)  TempSrc: Oral Oral Oral Oral  SpO2: 100% 100% 100% 99%  Weight:      Height:        Intake/Output Summary (Last 24 hours) at 05/14/2019 1630 Last data filed at 05/14/2019 1245 Gross per 24 hour  Intake 720 ml  Output -  Net 720 ml   Filed Weights   05/13/19 1312  Weight: 106.6 kg    Examination:  General exam: Appears calm and comfortable  Respiratory system: Clear to auscultation. Respiratory  effort normal. Cardiovascular system: S1 & S2 heard, RRR.  Gastrointestinal system: Abdomen is nondistended, soft and nontender. Central nervous system: Alert and oriented. No focal neurological deficits. Extremities: R first toe erythematous  Psychiatry: Judgement and insight appear normal. Mood & affect appropriate.     Data Reviewed: I have personally reviewed following labs and imaging studies  CBC: Recent Labs  Lab 05/13/19 1435 05/14/19 0419  WBC 5.6 4.0  HGB 13.7 11.4*  HCT 41.4 34.4*  MCV 103.8* 104.6*  PLT 154 427*   Basic Metabolic Panel: Recent Labs  Lab 05/13/19 1435 05/14/19 0419  NA 140 138  K 3.9 4.6  CL 107 109  CO2 23 22  GLUCOSE 107* 179*  BUN 25* 20  CREATININE 1.27* 1.21  CALCIUM 9.5 8.5*   GFR: Estimated Creatinine Clearance: 76.8 mL/min (by C-G formula based on SCr of 1.21 mg/dL). Liver Function Tests: No results for input(s): AST, ALT, ALKPHOS, BILITOT, PROT, ALBUMIN in the last 168 hours. No results for input(s): LIPASE, AMYLASE in the last 168 hours. No results for input(s): AMMONIA in the last 168 hours. Coagulation Profile: No results for input(s): INR, PROTIME in the last 168 hours. Cardiac Enzymes: No results for input(s): CKTOTAL, CKMB, CKMBINDEX, TROPONINI in the last 168 hours. BNP (last 3 results) No results for input(s): PROBNP in the last 8760 hours. HbA1C: No results for input(s): HGBA1C in the last 72 hours. CBG: Recent Labs  Lab 05/13/19 2040 05/14/19 0849 05/14/19 1213  GLUCAP 148* 141* 136*   Lipid Profile: No results for input(s): CHOL, HDL, LDLCALC, TRIG, CHOLHDL, LDLDIRECT in the last 72 hours. Thyroid Function Tests: No results for input(s): TSH, T4TOTAL, FREET4, T3FREE, THYROIDAB in the last 72 hours. Anemia Panel: No results for input(s): VITAMINB12, FOLATE, FERRITIN, TIBC, IRON, RETICCTPCT in the last 72 hours. Sepsis Labs: No results for input(s): PROCALCITON, LATICACIDVEN in the last 168 hours.  Recent  Results (from the past 240 hour(s))  WOUND CULTURE     Status: None   Collection Time: 05/09/19 10:15 AM  Result Value Ref Range Status   MICRO NUMBER: [id number]  Final   SPECIMEN QUALITY: Adequate  Final   SOURCE: NOT GIVEN  Final   STATUS: FINAL  Final   GRAM STAIN:   Final    No organisms or white blood cells seen No epithelial cells seen   RESULT:   Final    Growth of skin flora (note: Growth does not include S. aureus, beta-hemolytic Streptococci or P. aeruginosa).  Culture, blood (single) w Reflex to ID Panel     Status: None (Preliminary result)   Collection Time: 05/13/19  2:35 PM  Result Value Ref Range Status   Specimen Description BLOOD LEFT ANTECUBITAL  Final   Special Requests   Final    BOTTLES DRAWN AEROBIC AND ANAEROBIC Blood Culture adequate volume   Culture   Final    NO GROWTH < 24 HOURS Performed at [facility], [address], [place], [place]  [place]    Report Status PENDING  Incomplete  SARS Coronavirus 2 (CEPHEID - Performed in [place] hospital lab), Hosp Order     Status: None   Collection Time: 05/13/19  2:35 PM  Result Value Ref Range Status   SARS Coronavirus 2 NEGATIVE NEGATIVE Final    Comment: (NOTE) If result is NEGATIVE SARS-CoV-2 target nucleic acids are NOT DETECTED. The SARS-CoV-2 RNA is generally detectable in upper and lower  respiratory specimens during the acute phase of infection. The lowest  concentration of SARS-CoV-2 viral copies this assay can detect is 250  copies / mL. Concetta Guion negative result does not preclude SARS-CoV-2 infection  and should not be used as the sole basis for treatment or other  patient management decisions.  Thomasenia Dowse negative result may occur with  improper specimen collection / handling, submission of specimen other  than nasopharyngeal swab, presence of viral mutation(s) within the  areas targeted by this assay, and inadequate number of viral copies  (<250 copies / mL). Tierany Appleby negative result must be combined with  clinical  observations, patient history, and epidemiological information. If result is POSITIVE SARS-CoV-2 target nucleic acids are DETECTED. The SARS-CoV-2 RNA is generally detectable in upper and lower  respiratory specimens dur ing the acute phase of infection.  Positive  results are indicative of active infection with SARS-CoV-2.  Clinical  correlation with patient history and other diagnostic information is  necessary to determine patient infection status.  Positive results do  not rule out bacterial infection or co-infection with other viruses. If result is PRESUMPTIVE POSTIVE SARS-CoV-2 nucleic acids MAY BE PRESENT.   Zella Dewan presumptive positive result was obtained on the submitted specimen  and confirmed on repeat testing.  While 2019 novel coronavirus  (SARS-CoV-2) nucleic acids may be present in the submitted sample  additional confirmatory testing may be necessary for epidemiological  and / or clinical management purposes  to differentiate between  SARS-CoV-2 and other Sarbecovirus currently known to infect humans.  If clinically indicated additional testing with an alternate test  methodology [phone]) is advised. The SARS-CoV-2 RNA is generally  detectable in upper and lower respiratory sp ecimens during the acute  phase of infection. The expected result is Negative. Fact Sheet for Patients:  StrictlyIdeas.no Fact Sheet for Healthcare Providers: BankingDealers.co.za This test is not yet approved or cleared by the [place] FDA and has been authorized for detection and/or diagnosis of SARS-CoV-2 by FDA under an Emergency Use Authorization (EUA).  This EUA will remain in effect (meaning this test can be used) for the duration of the COVID-19 declaration under Section 564(b)(1) of the Act, 21 U.S.C. section 360bbb-3(b)(1), unless the authorization is terminated or revoked sooner. Performed at [facility], [place]  [address] [place], [place] [address]          Radiology Studies: Dg Tibia/fibula Right  Result Date: 05/13/2019 CLINICAL DATA:  Pain EXAM: RIGHT TIBIA AND FIBULA - 2 VIEW COMPARISON:  None. FINDINGS: No acute fracture or dislocation of the right tibia or fibula. There is plate and screw fixation of the distal right fibula. The partially imaged right knee and right ankle are unremarkable. Soft tissue edema about the right leg. IMPRESSION: No acute fracture or dislocation of the right tibia or fibula. There is plate and screw fixation of the distal right fibula. The partially imaged right knee and right ankle are unremarkable. Soft tissue edema about the right leg. Electronically Signed   By: Dorna Bloom.D.  On: 05/13/2019 14:25   Dg Foot Complete Right  Result Date: 05/13/2019 CLINICAL DATA:  RIGHT foot pain onset 6 weeks ago. History of fracture with infection treated with IV antibiotics about 4 weeks ago. EXAM: RIGHT FOOT COMPLETE - 3+ VIEW COMPARISON:  Plain film of the RIGHT foot dated 05/09/2019. FINDINGS: Fixation pin has been removed from the great toe. Associated healing fracture of the proximal phalanx with stable alignment compared to the previous plain film of 05/09/2019. Distal phalanx appears intact and normal in mineralization. Remainder of the osseous structures of the RIGHT foot appear intact and normal in mineralization. Soft tissues about the RIGHT foot are unremarkable. No soft tissue gas seen. IMPRESSION: No acute findings. No convincing evidence of osteomyelitis. No soft tissue gas. Healing fracture of the proximal phalanx of the RIGHT great toe, with stable alignment, status post fixation pin removal since plain film of 05/09/2019. Electronically Signed   By: Franki Cabot M.D.   On: 05/13/2019 14:23        Scheduled Meds: . abacavir-dolutegravir-lamiVUDine  1 tablet Oral Daily  . busPIRone  10 mg Oral TID  . DULoxetine  30 mg Oral QPM  . gabapentin  600 mg Oral TID  .  heparin  5,000 Units Subcutaneous Q8H  . insulin aspart  0-5 Units Subcutaneous QHS  . insulin aspart  0-9 Units Subcutaneous TID WC  . mirabegron ER  50 mg Oral QPM  . pantoprazole  40 mg Oral Daily  . propranolol ER  80 mg Oral Daily  . tamsulosin  0.4 mg Oral Daily  . tiZANidine  4 mg Oral QHS  . topiramate  50 mg Oral BID  . vitamin B-12  5,000 mcg Oral Daily   Continuous Infusions: . sodium chloride 75 mL/hr at 05/13/19 2046  . piperacillin-tazobactam (ZOSYN)  IV 3.375 g (05/14/19 1611)  . vancomycin 1,500 mg (05/14/19 1613)     LOS: 1 day    Time spent: over 29 min    Fayrene Helper, MD [facility] Pager AMION  If 7PM-7AM, please contact night-coverage [email or web address] Password [facility] 05/14/2019, 4:30 PM

## 2019-05-14 NOTE — Telephone Encounter (Signed)
Dr. Florene Glen called to the office to ask a podiatrist to come and treat Brian Malone.  Mr Brian Malone has been admitted to the hospital and Dr.  Florene Glen wants him to be evaluated/  He had surgery performed by Dr.  Amalia Hailey for hardware removal according to the doctor and now has a cellulitis..  Dr.  Amalia Hailey was notified about his patient having a diabetic foot infection.  Dr.  Amalia Hailey says he will see the patient today.  Gardiner Barefoot DPM

## 2019-05-15 ENCOUNTER — Encounter: Payer: Self-pay | Admitting: Podiatry

## 2019-05-15 ENCOUNTER — Inpatient Hospital Stay (HOSPITAL_COMMUNITY): Payer: Medicare Other

## 2019-05-15 ENCOUNTER — Encounter (HOSPITAL_COMMUNITY): Payer: Self-pay | Admitting: *Deleted

## 2019-05-15 DIAGNOSIS — M461 Sacroiliitis, not elsewhere classified: Secondary | ICD-10-CM | POA: Insufficient documentation

## 2019-05-15 DIAGNOSIS — L97519 Non-pressure chronic ulcer of other part of right foot with unspecified severity: Secondary | ICD-10-CM

## 2019-05-15 LAB — MAGNESIUM: Magnesium: 2 mg/dL (ref 1.7–2.4)

## 2019-05-15 LAB — COMPREHENSIVE METABOLIC PANEL
ALT: 19 U/L (ref 0–44)
AST: 15 U/L (ref 15–41)
Albumin: 3.3 g/dL — ABNORMAL LOW (ref 3.5–5.0)
Alkaline Phosphatase: 65 U/L (ref 38–126)
Anion gap: 8 (ref 5–15)
BUN: 23 mg/dL (ref 8–23)
CO2: 23 mmol/L (ref 22–32)
Calcium: 9.1 mg/dL (ref 8.9–10.3)
Chloride: 107 mmol/L (ref 98–111)
Creatinine, Ser: 1.37 mg/dL — ABNORMAL HIGH (ref 0.61–1.24)
GFR calc Af Amer: >60 mL/min (ref >60)
GFR calc non Af Amer: 54 mL/min — ABNORMAL LOW (ref >60)
Glucose, Bld: 197 mg/dL — ABNORMAL HIGH (ref 70–99)
Potassium: 4.5 mmol/L (ref 3.5–5.1)
Sodium: 138 mmol/L (ref 135–145)
Total Bilirubin: 0.8 mg/dL (ref 0.3–1.2)
Total Protein: 6.1 g/dL — ABNORMAL LOW (ref 6.5–8.1)

## 2019-05-15 LAB — CBC
HCT: 35.5 % — ABNORMAL LOW (ref 39.0–52.0)
Hemoglobin: 11.9 g/dL — ABNORMAL LOW (ref 13.0–17.0)
MCH: 34.8 pg — ABNORMAL HIGH (ref 26.0–34.0)
MCHC: 33.5 g/dL (ref 30.0–36.0)
MCV: 103.8 fL — ABNORMAL HIGH (ref 80.0–100.0)
Platelets: 133 10*3/uL — ABNORMAL LOW (ref 150–400)
RBC: 3.42 MIL/uL — ABNORMAL LOW (ref 4.22–5.81)
RDW: 14.5 % (ref 11.5–15.5)
WBC: 4.1 10*3/uL (ref 4.0–10.5)
nRBC: 0 % (ref 0.0–0.2)

## 2019-05-15 LAB — GLUCOSE, CAPILLARY
Glucose-Capillary: 166 mg/dL — ABNORMAL HIGH (ref 70–99)
Glucose-Capillary: 120 mg/dL — ABNORMAL HIGH (ref 70–99)
Glucose-Capillary: 146 mg/dL — ABNORMAL HIGH (ref 70–99)
Glucose-Capillary: 151 mg/dL — ABNORMAL HIGH (ref 70–99)

## 2019-05-15 LAB — HEMOGLOBIN A1C
Hgb A1c MFr Bld: 6.7 % — ABNORMAL HIGH (ref 4.8–5.6)
Mean Plasma Glucose: 145.59 mg/dL

## 2019-05-15 NOTE — Progress Notes (Signed)
ABI       has been completed. Preliminary results can be found under CV proc through chart review. Reannon Candella, BS, RDMS, RVT   

## 2019-05-15 NOTE — Progress Notes (Signed)
PROGRESS NOTE    BAIRD POLINSKI  ZOX:[id number] DOB: Jul 23, 1951 DOA: 05/13/2019 PCP: Doreatha Lew, MD   Brief Narrative:  Patient is a 68 year old male with history of HIV, diabetes mellitus, NIDDM, anxiety, CKD stage III, neuropathy presented to ED with right foot and great toe wound infection and pain worsening in the last 3 days.  And, he had injury to his right great toe/foot with a mechanical fall in 03/2019, underwent ORIF of the right great toe on 4/15.  Patient was recently admitted on 4/26 - 4/29 for the possible infection of the surgical toe, was placed on IV antibiotics, then discharged on Bactrim and Keflex.  Patient followed up with his podiatrist, Dr. Amalia Hailey, last seen on 05/09/2019, patient was noticed to have increased swelling and redness to the surgical toe in the past week.  Due to concern for recurrent infection, he was placed on oral Keflex. Patient reports that despite taking Keflex for the past 5 days, he has been having worsening pain, redness to the right toe spreading to the right foot and ankle, purulent drainage from the top of the toe.  He reported occasional shaking chills, generalized malaise otherwise denied any nausea vomiting, chest pain or shortness of breath.  Admitted for R great toe cellulitis.  S/p ORIF of R great toe on 4/15.  Improving on IV abx.  Hopeful for discharge on 5/27.    Assessment & Plan:   Principal Problem:   Right foot ulcer ([place]) Active Problems:   Diabetes ([place])   Cellulitis   HIV (human immunodeficiency virus infection) ([place])   Right great toe cellulitis with infection, surgical toe, recurrent: Outpatient failure to oral antibiotics -X-rays of the right foot, negative for osteomyelitis -per Dr. Amalia Hailey with podiatry, recommending continued IV abx, can d/c on oral abx once cellulitis improved -Currently on vanc/zosyn (apparently did not tolerate flagyl at previous admission) - if continued improvement 5/27, consider d/c  tomorrow on doxy/augmentin - Blood cx pending from 5/24 (NGTD x 2 days) - ABI's normal   Diabetes mellitus type II, NIDDM ([place]) -Hold oral hypoglycemics -A1c pending -SSI - BG appropriate  HIV (human immunodeficiency virus infection) ([place]) -Continue Triumeq -HIV RNA <20 01/19/2019 per care everywhere  GERD Continue PPI  Hypertension Continue Inderal  Anxiety, depression Continue Cymbalta, BuSpar, Vistaril as needed  DVT prophylaxis: (heparin Code Status: full  Family Communication: none at bedside Disposition Plan: pending eval by podiatry   Consultants:   podiatry  Procedures:   none  Antimicrobials:  Anti-infectives (From admission, onward)   Start     Dose/Rate Route Frequency Ordered Stop   05/14/19 1600  vancomycin (VANCOCIN) 1,500 mg in sodium chloride 0.9 % 500 mL IVPB     1,500 mg 250 mL/hr over 120 Minutes Intravenous Every 24 hours 05/14/19 1424     05/14/19 1500  piperacillin-tazobactam (ZOSYN) IVPB 3.375 g     3.375 g 12.5 mL/hr over 240 Minutes Intravenous Every 8 hours 05/14/19 1420     05/13/19 2000  abacavir-dolutegravir-lamiVUDine (TRIUMEQ) 600-50-300 MG per tablet 1 tablet     1 tablet Oral Daily 05/13/19 1953     05/13/19 1400  vancomycin (VANCOCIN) 2,000 mg in sodium chloride 0.9 % 500 mL IVPB     2,000 mg 250 mL/hr over 120 Minutes Intravenous  Once 05/13/19 1344 05/13/19 1727     Subjective: Doesn't feel comfortable d/c today. Feels improved, but worried about bouncing back. Still redness and pain to R first toe.  Objective: Vitals:  05/14/19 2124 05/15/19 0545 05/15/19 0825 05/15/19 1256  BP: (!) 152/84 116/80 119/83 117/74  Pulse: (!) 59 61 68 68  Resp: 18 18  18   Temp: 98 F (36.7 C) 97.6 F (36.4 C)  97.7 F (36.5 C)  TempSrc: Oral Oral  Oral  SpO2: 96% 99%  100%  Weight:      Height:        Intake/Output Summary (Last 24 hours) at 05/15/2019 1748 Last data filed at 05/15/2019 1713 Gross per 24 hour  Intake  854.12 ml  Output 850 ml  Net 4.12 ml   Filed Weights   05/13/19 1312  Weight: 106.6 kg    Examination:  General: No acute distress. Cardiovascular: Heart sounds show a regular rate, and rhythm.  Lungs: Clear to auscultation bilaterally  Abdomen: Soft, nontender, nondistended  Neurological: Alert and oriented 3. Moves all extremities 4. Cranial nerves II through XII grossly intact. Skin: erythema and swelling to R first toe Psychiatric: Mood and affect are normal. Insight and judgment are appropriate.   Data Reviewed: I have personally reviewed following labs and imaging studies  CBC: Recent Labs  Lab 05/13/19 1435 05/14/19 0419 05/15/19 0320  WBC 5.6 4.0 4.1  HGB 13.7 11.4* 11.9*  HCT 41.4 34.4* 35.5*  MCV 103.8* 104.6* 103.8*  PLT 154 128* 627*   Basic Metabolic Panel: Recent Labs  Lab 05/13/19 1435 05/14/19 0419 05/15/19 0320  NA 140 138 138  K 3.9 4.6 4.5  CL 107 109 107  CO2 23 22 23   GLUCOSE 107* 179* 197*  BUN 25* 20 23  CREATININE 1.27* 1.21 1.37*  CALCIUM 9.5 8.5* 9.1  MG  --   --  2.0   GFR: Estimated Creatinine Clearance: 67.8 mL/min (A) (by C-G formula based on SCr of 1.37 mg/dL (H)). Liver Function Tests: Recent Labs  Lab 05/15/19 0320  AST 15  ALT 19  ALKPHOS 65  BILITOT 0.8  PROT 6.1*  ALBUMIN 3.3*   No results for input(s): LIPASE, AMYLASE in the last 168 hours. No results for input(s): AMMONIA in the last 168 hours. Coagulation Profile: No results for input(s): INR, PROTIME in the last 168 hours. Cardiac Enzymes: No results for input(s): CKTOTAL, CKMB, CKMBINDEX, TROPONINI in the last 168 hours. BNP (last 3 results) No results for input(s): PROBNP in the last 8760 hours. HbA1C: No results for input(s): HGBA1C in the last 72 hours. CBG: Recent Labs  Lab 05/14/19 1644 05/14/19 2121 05/15/19 0806 05/15/19 1112 05/15/19 1549  GLUCAP 125* 122* 166* 120* 146*   Lipid Profile: No results for input(s): CHOL, HDL, LDLCALC,  TRIG, CHOLHDL, LDLDIRECT in the last 72 hours. Thyroid Function Tests: No results for input(s): TSH, T4TOTAL, FREET4, T3FREE, THYROIDAB in the last 72 hours. Anemia Panel: No results for input(s): VITAMINB12, FOLATE, FERRITIN, TIBC, IRON, RETICCTPCT in the last 72 hours. Sepsis Labs: No results for input(s): PROCALCITON, LATICACIDVEN in the last 168 hours.  Recent Results (from the past 240 hour(s))  WOUND CULTURE     Status: None   Collection Time: 05/09/19 10:15 AM  Result Value Ref Range Status   MICRO NUMBER: [id number]  Final   SPECIMEN QUALITY: Adequate  Final   SOURCE: NOT GIVEN  Final   STATUS: FINAL  Final   GRAM STAIN:   Final    No organisms or white blood cells seen No epithelial cells seen   RESULT:   Final    Growth of skin flora (note: Growth does not include S.  aureus, beta-hemolytic Streptococci or P. aeruginosa).  Culture, blood (single) w Reflex to ID Panel     Status: None (Preliminary result)   Collection Time: 05/13/19  2:35 PM  Result Value Ref Range Status   Specimen Description BLOOD LEFT ANTECUBITAL  Final   Special Requests   Final    BOTTLES DRAWN AEROBIC AND ANAEROBIC Blood Culture adequate volume   Culture   Final    NO GROWTH 2 DAYS Performed at [facility], [address] [place], [place] [address]    Report Status PENDING  Incomplete  SARS Coronavirus 2 (CEPHEID - Performed in [place] hospital lab), Hosp Order     Status: None   Collection Time: 05/13/19  2:35 PM  Result Value Ref Range Status   SARS Coronavirus 2 NEGATIVE NEGATIVE Final    Comment: (NOTE) If result is NEGATIVE SARS-CoV-2 target nucleic acids are NOT DETECTED. The SARS-CoV-2 RNA is generally detectable in upper and lower  respiratory specimens during the acute phase of infection. The lowest  concentration of SARS-CoV-2 viral copies this assay can detect is 250  copies / mL. A negative result does not preclude SARS-CoV-2 infection  and should not be used as the sole  basis for treatment or other  patient management decisions.  A negative result may occur with  improper specimen collection / handling, submission of specimen other  than nasopharyngeal swab, presence of viral mutation(s) within the  areas targeted by this assay, and inadequate number of viral copies  (<250 copies / mL). A negative result must be combined with clinical  observations, patient history, and epidemiological information. If result is POSITIVE SARS-CoV-2 target nucleic acids are DETECTED. The SARS-CoV-2 RNA is generally detectable in upper and lower  respiratory specimens dur ing the acute phase of infection.  Positive  results are indicative of active infection with SARS-CoV-2.  Clinical  correlation with patient history and other diagnostic information is  necessary to determine patient infection status.  Positive results do  not rule out bacterial infection or co-infection with other viruses. If result is PRESUMPTIVE POSTIVE SARS-CoV-2 nucleic acids MAY BE PRESENT.   A presumptive positive result was obtained on the submitted specimen  and confirmed on repeat testing.  While 2019 novel coronavirus  (SARS-CoV-2) nucleic acids may be present in the submitted sample  additional confirmatory testing may be necessary for epidemiological  and / or clinical management purposes  to differentiate between  SARS-CoV-2 and other Sarbecovirus currently known to infect humans.  If clinically indicated additional testing with an alternate test  methodology [phone]) is advised. The SARS-CoV-2 RNA is generally  detectable in upper and lower respiratory sp ecimens during the acute  phase of infection. The expected result is Negative. Fact Sheet for Patients:  StrictlyIdeas.no Fact Sheet for Healthcare Providers: BankingDealers.co.za This test is not yet approved or cleared by the [place] FDA and has been authorized for detection  and/or diagnosis of SARS-CoV-2 by FDA under an Emergency Use Authorization (EUA).  This EUA will remain in effect (meaning this test can be used) for the duration of the COVID-19 declaration under Section 564(b)(1) of the Act, 21 U.S.C. section 360bbb-3(b)(1), unless the authorization is terminated or revoked sooner. Performed at [facility], [place] [address], [place], [place] [address]          Radiology Studies: Vas [place] Abi With/wo Tbi  Result Date: 05/15/2019 LOWER EXTREMITY DOPPLER STUDY Indications: Right toe ulcer. High Risk Factors: Diabetes.  Performing  Technologist: June Leap RDMS, RVT  Examination Guidelines: A complete evaluation includes at minimum, Doppler waveform signals and systolic blood pressure reading at the level of bilateral brachial, anterior tibial, and posterior tibial arteries, when vessel segments are accessible. Bilateral testing is considered an integral part of a complete examination. Photoelectric Plethysmograph (PPG) waveforms and toe systolic pressure readings are included as required and additional duplex testing as needed. Limited examinations for reoccurring indications may be performed as noted.  ABI Findings: +---------+------------------+-----+---------+---------------------------------+ Right    Rt Pressure (mmHg)IndexWaveform Comment                           +---------+------------------+-----+---------+---------------------------------+ Brachial 135                    triphasic                                  +---------+------------------+-----+---------+---------------------------------+ ATA      135               1.00 triphasic                                  +---------+------------------+-----+---------+---------------------------------+ PTA      162               1.20 triphasic                                  +---------+------------------+-----+---------+---------------------------------+ Great Toe                        Abnormal Pressure could not be obtained                                             due to pain in toe                +---------+------------------+-----+---------+---------------------------------+ +---------+------------------+-----+---------+-------+ Left     Lt Pressure (mmHg)IndexWaveform Comment +---------+------------------+-----+---------+-------+ Brachial 132                    triphasic        +---------+------------------+-----+---------+-------+ ATA      142               1.05 triphasic        +---------+------------------+-----+---------+-------+ PTA      160               1.19 triphasic        +---------+------------------+-----+---------+-------+ Great Toe                       Abnormal         +---------+------------------+-----+---------+-------+  Summary: Right: Resting right ankle-brachial index is within normal range. No evidence of significant right lower extremity arterial disease. Left: Resting left ankle-brachial index is within normal range. No evidence of significant left lower extremity arterial disease.  *See table(s) above for measurements and observations.  Electronically signed by Servando Snare MD on 05/15/2019 at 4:25:57 PM.   Final         Scheduled Meds: . abacavir-dolutegravir-lamiVUDine  1 tablet Oral Daily  .  busPIRone  10 mg Oral TID  . DULoxetine  30 mg Oral QPM  . gabapentin  600 mg Oral TID  . heparin  5,000 Units Subcutaneous Q8H  . insulin aspart  0-5 Units Subcutaneous QHS  . insulin aspart  0-9 Units Subcutaneous TID WC  . mirabegron ER  50 mg Oral QPM  . pantoprazole  40 mg Oral Daily  . propranolol ER  80 mg Oral Daily  . tamsulosin  0.4 mg Oral Daily  . tiZANidine  4 mg Oral QHS  . topiramate  50 mg Oral BID  . vitamin B-12  5,000 mcg Oral Daily   Continuous Infusions: . sodium chloride 75 mL/hr at 05/13/19 2046  . piperacillin-tazobactam (ZOSYN)  IV 3.375 g (05/15/19 1531)  . vancomycin 1,500 mg  (05/15/19 1531)     LOS: 2 days    Time spent: over 30 min    Fayrene Helper, MD [facility] Pager AMION  If 7PM-7AM, please contact night-coverage [email or web address] Password [facility] 05/15/2019, 5:48 PM

## 2019-05-16 LAB — CBC
HCT: 38.2 % — ABNORMAL LOW (ref 39.0–52.0)
Hemoglobin: 12.8 g/dL — ABNORMAL LOW (ref 13.0–17.0)
MCH: 34.4 pg — ABNORMAL HIGH (ref 26.0–34.0)
MCHC: 33.5 g/dL (ref 30.0–36.0)
MCV: 102.7 fL — ABNORMAL HIGH (ref 80.0–100.0)
Platelets: 151 10*3/uL (ref 150–400)
RBC: 3.72 MIL/uL — ABNORMAL LOW (ref 4.22–5.81)
RDW: 14.3 % (ref 11.5–15.5)
WBC: 4.6 10*3/uL (ref 4.0–10.5)
nRBC: 0 % (ref 0.0–0.2)

## 2019-05-16 LAB — COMPREHENSIVE METABOLIC PANEL
ALT: 17 U/L (ref 0–44)
AST: 13 U/L — ABNORMAL LOW (ref 15–41)
Albumin: 3.5 g/dL (ref 3.5–5.0)
Alkaline Phosphatase: 60 U/L (ref 38–126)
Anion gap: 8 (ref 5–15)
BUN: 25 mg/dL — ABNORMAL HIGH (ref 8–23)
CO2: 22 mmol/L (ref 22–32)
Calcium: 9.1 mg/dL (ref 8.9–10.3)
Chloride: 107 mmol/L (ref 98–111)
Creatinine, Ser: 1.6 mg/dL — ABNORMAL HIGH (ref 0.61–1.24)
GFR calc Af Amer: 52 mL/min — ABNORMAL LOW (ref >60)
GFR calc non Af Amer: 45 mL/min — ABNORMAL LOW (ref >60)
Glucose, Bld: 148 mg/dL — ABNORMAL HIGH (ref 70–99)
Potassium: 4.3 mmol/L (ref 3.5–5.1)
Sodium: 137 mmol/L (ref 135–145)
Total Bilirubin: 0.9 mg/dL (ref 0.3–1.2)
Total Protein: 6.3 g/dL — ABNORMAL LOW (ref 6.5–8.1)

## 2019-05-16 LAB — GLUCOSE, CAPILLARY
Glucose-Capillary: 183 mg/dL — ABNORMAL HIGH (ref 70–99)
Glucose-Capillary: 134 mg/dL — ABNORMAL HIGH (ref 70–99)
Glucose-Capillary: 123 mg/dL — ABNORMAL HIGH (ref 70–99)
Glucose-Capillary: 140 mg/dL — ABNORMAL HIGH (ref 70–99)

## 2019-05-16 MED ORDER — DOXYCYCLINE HYCLATE 100 MG PO TABS
100.0000 mg | ORAL_TABLET | Freq: Two times a day (BID) | ORAL | Status: DC
  Administered 2019-05-16 – 2019-05-17 (×3): 100 mg via ORAL
  Filled 2019-05-16 (×3): qty 1

## 2019-05-16 MED ORDER — AMOXICILLIN-POT CLAVULANATE 875-125 MG PO TABS
1.0000 | ORAL_TABLET | Freq: Two times a day (BID) | ORAL | Status: DC
  Administered 2019-05-16 – 2019-05-17 (×3): 1 via ORAL
  Filled 2019-05-16 (×3): qty 1

## 2019-05-16 NOTE — Progress Notes (Signed)
PROGRESS NOTE  Brian Malone BSJ:[id number] DOB: 07-Nov-1951 DOA: 05/13/2019 PCP: Brian Malone, [name], MD  HPI/Recap of past 24 hours: Patient is a 68 year old male with history of HIV, diabetes mellitus, NIDDM, anxiety, CKD stage III, neuropathy presented to ED with right foot and great toe wound infection and pain worsening in the last 3 days. And, he had injury to his right great toe/foot with a mechanical fall in 03/2019, underwent ORIF of the right great toe on 4/15. Patient was recently admitted on 4/26 -4/29 for the possible infection of the surgical toe,was placed on IV antibiotics, then discharged on Bactrim and Keflex. Patient followed up with his podiatrist, Dr. Amalia Hailey, last seen on 05/09/2019, patient was noticed to have increased swelling and redness to the surgical toe in the past week. Due to concern for recurrent infection, he was placed on oral Keflex. Patient reports that despite taking Keflex for the past 5 days, he has been having worsening pain, redness to the right toe spreading to the right foot and ankle, purulent drainage from the top of the toe. He reported occasional shaking chills, generalized malaise otherwise denied any nausea vomiting, chest pain or shortness of breath.  Admitted for R great toe cellulitis.  S/p ORIF of R great toe on 4/15.  Improving on IV abx.    05/16/19: Patient seen and examined at his bedside.  No acute events overnight.  States not feeling too well today.  Vital signs and labs reviewed.  Worsening renal function noted.  Continue gentle IV fluid hydration and monitor urine output.  Assessment/Plan: Principal Problem:   Right foot ulcer (HCC) Active Problems:   Diabetes ([place])   Cellulitis   HIV (human immunodeficiency virus infection) ([place])  Right great toe cellulitis with infection,surgical toe, recurrent:Outpatient failure to oral antibiotics -X-rays of the right foot, negative for osteomyelitis -per Dr. Amalia Hailey with podiatry,  recommending continued IV abx, can d/c on oral abx once cellulitis improved -Currently on vanc/zosyn (apparently did not tolerate flagyl at previous admission) switched to doxycycline at [facility] on 05/16/2019. -Possible d/c tomorrow 05/17/2019 on doxy/augmentin - Blood cx pending from 5/24 (NGTD x 2 days) - ABI's normal   AKI on CKD 3 Baseline creatinine appears to be 1.2 with GFR greater than 60 Today creatinine is 1.60 with GFR of 45 400 cc urine output recorded in the last 24 hours, unclear if accurate Continue normal saline at 75 cc/h Continue strict I's and O's and closely monitor urine output Repeat BMP in the morning  Diabetesmellitus type II, NIDDM(HCC) -Hold oral hypoglycemics -A1c  6.7 on 05/15/2019 -SSI - BG appropriate  HIV (human immunodeficiency virus infection) ([place]) -ContinueTriumeq -HIV RNA <20 01/19/2019 per care everywhere  GERD Continue PPI  Hypertension Continue Inderal  Anxiety, depression Continue Cymbalta, BuSpar, Vistaril as needed  BPH Continue home meds tamsulosin  DVT prophylaxis: (heparin Code Status: full  Family Communication: none at bedside Disposition Plan:  Possible discharge to home with home health services tomorrow 05/17/2019 pending improvement of renal function.   Consultants:   podiatry  Procedures:   none    Objective: Vitals:   05/15/19 1256 05/15/19 1946 05/16/19 0603 05/16/19 0928  BP: 117/74 134/81 117/73 102/76  Pulse: 68 (!) 58 61 73  Resp: 18 16 18    Temp: 97.7 F (36.5 C) 98.3 F (36.8 C) 97.7 F (36.5 C)   TempSrc: Oral Oral Oral   SpO2: 100% 99% 97%   Weight:      Height:  Intake/Output Summary (Last 24 hours) at 05/16/2019 1203 Last data filed at 05/15/2019 1713 Gross per 24 hour  Intake 240 ml  Output 400 ml  Net -160 ml   Filed Weights   05/13/19 1312  Weight: 106.6 kg    Exam:  . General: 68 y.o. year-old male well developed well nourished in no acute distress.  Alert  and oriented x3. . Cardiovascular: Regular rate and rhythm with no rubs or gallops.  No thyromegaly or JVD noted.   Marland Kitchen Respiratory: Clear to auscultation with no wheezes or rales. Good inspiratory effort. . Abdomen: Soft nontender nondistended with normal bowel sounds x4 quadrants. . Psychiatry: Mood is appropriate for condition and setting   Data Reviewed: CBC: Recent Labs  Lab 05/13/19 1435 05/14/19 0419 05/15/19 0320 05/16/19 0308  WBC 5.6 4.0 4.1 4.6  HGB 13.7 11.4* 11.9* 12.8*  HCT 41.4 34.4* 35.5* 38.2*  MCV 103.8* 104.6* 103.8* 102.7*  PLT 154 128* 133* 478   Basic Metabolic Panel: Recent Labs  Lab 05/13/19 1435 05/14/19 0419 05/15/19 0320 05/16/19 0308  NA 140 138 138 137  K 3.9 4.6 4.5 4.3  CL 107 109 107 107  CO2 23 22 23 22   GLUCOSE 107* 179* 197* 148*  BUN 25* 20 23 25*  CREATININE 1.27* 1.21 1.37* 1.60*  CALCIUM 9.5 8.5* 9.1 9.1  MG  --   --  2.0  --    GFR: Estimated Creatinine Clearance: 58.1 mL/min (A) (by C-G formula based on SCr of 1.6 mg/dL (H)). Liver Function Tests: Recent Labs  Lab 05/15/19 0320 05/16/19 0308  AST 15 13*  ALT 19 17  ALKPHOS 65 60  BILITOT 0.8 0.9  PROT 6.1* 6.3*  ALBUMIN 3.3* 3.5   No results for input(s): LIPASE, AMYLASE in the last 168 hours. No results for input(s): AMMONIA in the last 168 hours. Coagulation Profile: No results for input(s): INR, PROTIME in the last 168 hours. Cardiac Enzymes: No results for input(s): CKTOTAL, CKMB, CKMBINDEX, TROPONINI in the last 168 hours. BNP (last 3 results) No results for input(s): PROBNP in the last 8760 hours. HbA1C: Recent Labs    05/15/19 1821  HGBA1C 6.7*   CBG: Recent Labs  Lab 05/15/19 0806 05/15/19 1112 05/15/19 1549 05/15/19 2120 05/16/19 0815  GLUCAP 166* 120* 146* 151* 183*   Lipid Profile: No results for input(s): CHOL, HDL, LDLCALC, TRIG, CHOLHDL, LDLDIRECT in the last 72 hours. Thyroid Function Tests: No results for input(s): TSH, T4TOTAL,  FREET4, T3FREE, THYROIDAB in the last 72 hours. Anemia Panel: No results for input(s): VITAMINB12, FOLATE, FERRITIN, TIBC, IRON, RETICCTPCT in the last 72 hours. Urine analysis: No results found for: COLORURINE, APPEARANCEUR, LABSPEC, PHURINE, GLUCOSEU, HGBUR, BILIRUBINUR, KETONESUR, PROTEINUR, UROBILINOGEN, NITRITE, LEUKOCYTESUR Sepsis Labs: @LABRCNTIP (procalcitonin:4,lacticidven:4)  ) Recent Results (from the past 240 hour(s))  WOUND CULTURE     Status: None   Collection Time: 05/09/19 10:15 AM  Result Value Ref Range Status   MICRO NUMBER: [id number]  Final   SPECIMEN QUALITY: Adequate  Final   SOURCE: NOT GIVEN  Final   STATUS: FINAL  Final   GRAM STAIN:   Final    No organisms or white blood cells seen No epithelial cells seen   RESULT:   Final    Growth of skin flora (note: Growth does not include S. aureus, beta-hemolytic Streptococci or P. aeruginosa).  Culture, blood (single) w Reflex to ID Panel     Status: None (Preliminary result)   Collection Time: 05/13/19  2:35  PM  Result Value Ref Range Status   Specimen Description BLOOD LEFT ANTECUBITAL  Final   Special Requests   Final    BOTTLES DRAWN AEROBIC AND ANAEROBIC Blood Culture adequate volume   Culture   Final    NO GROWTH 3 DAYS Performed at [facility], [address], [place], [place] [address]    Report Status PENDING  Incomplete  SARS Coronavirus 2 (CEPHEID - Performed in [place] hospital lab), Hosp Order     Status: None   Collection Time: 05/13/19  2:35 PM  Result Value Ref Range Status   SARS Coronavirus 2 NEGATIVE NEGATIVE Final    Comment: (NOTE) If result is NEGATIVE SARS-CoV-2 target nucleic acids are NOT DETECTED. The SARS-CoV-2 RNA is generally detectable in upper and lower  respiratory specimens during the acute phase of infection. The lowest  concentration of SARS-CoV-2 viral copies this assay can detect is 250  copies / mL. A negative result does not preclude SARS-CoV-2 infection  and  should not be used as the sole basis for treatment or other  patient management decisions.  A negative result may occur with  improper specimen collection / handling, submission of specimen other  than nasopharyngeal swab, presence of viral mutation(s) within the  areas targeted by this assay, and inadequate number of viral copies  (<250 copies / mL). A negative result must be combined with clinical  observations, patient history, and epidemiological information. If result is POSITIVE SARS-CoV-2 target nucleic acids are DETECTED. The SARS-CoV-2 RNA is generally detectable in upper and lower  respiratory specimens dur ing the acute phase of infection.  Positive  results are indicative of active infection with SARS-CoV-2.  Clinical  correlation with patient history and other diagnostic information is  necessary to determine patient infection status.  Positive results do  not rule out bacterial infection or co-infection with other viruses. If result is PRESUMPTIVE POSTIVE SARS-CoV-2 nucleic acids MAY BE PRESENT.   A presumptive positive result was obtained on the submitted specimen  and confirmed on repeat testing.  While 2019 novel coronavirus  (SARS-CoV-2) nucleic acids may be present in the submitted sample  additional confirmatory testing may be necessary for epidemiological  and / or clinical management purposes  to differentiate between  SARS-CoV-2 and other Sarbecovirus currently known to infect humans.  If clinically indicated additional testing with an alternate test  methodology [phone]) is advised. The SARS-CoV-2 RNA is generally  detectable in upper and lower respiratory sp ecimens during the acute  phase of infection. The expected result is Negative. Fact Sheet for Patients:  StrictlyIdeas.no Fact Sheet for Healthcare Providers: BankingDealers.co.za This test is not yet approved or cleared by the [place] FDA and has been  authorized for detection and/or diagnosis of SARS-CoV-2 by FDA under an Emergency Use Authorization (EUA).  This EUA will remain in effect (meaning this test can be used) for the duration of the COVID-19 declaration under Section 564(b)(1) of the Act, 21 U.S.C. section 360bbb-3(b)(1), unless the authorization is terminated or revoked sooner. Performed at [facility], [place] [address] [place], [place] [address]       Studies: No results found.  Scheduled Meds: . abacavir-dolutegravir-lamiVUDine  1 tablet Oral Daily  . busPIRone  10 mg Oral TID  . DULoxetine  30 mg Oral QPM  . gabapentin  600 mg Oral TID  . heparin  5,000 Units Subcutaneous Q8H  . insulin aspart  0-5 Units Subcutaneous QHS  . insulin  aspart  0-9 Units Subcutaneous TID WC  . mirabegron ER  50 mg Oral QPM  . pantoprazole  40 mg Oral Daily  . propranolol ER  80 mg Oral Daily  . tamsulosin  0.4 mg Oral Daily  . tiZANidine  4 mg Oral QHS  . topiramate  50 mg Oral BID  . vitamin B-12  5,000 mcg Oral Daily    Continuous Infusions: . sodium chloride 75 mL/hr at 05/13/19 2046  . piperacillin-tazobactam (ZOSYN)  IV 3.375 g (05/16/19 0548)  . vancomycin 1,500 mg (05/15/19 1531)     LOS: 3 days     Kayleen Memos, MD [facility] Pager [phone]  If 7PM-7AM, please contact night-coverage [email or web address] Password [facility] 05/16/2019, 12:03 PM

## 2019-05-16 NOTE — Plan of Care (Signed)

## 2019-05-17 LAB — BASIC METABOLIC PANEL WITH GFR
Anion gap: 8 (ref 5–15)
BUN: 33 mg/dL — ABNORMAL HIGH (ref 8–23)
CO2: 22 mmol/L (ref 22–32)
Calcium: 9.1 mg/dL (ref 8.9–10.3)
Chloride: 106 mmol/L (ref 98–111)
Creatinine, Ser: 1.55 mg/dL — ABNORMAL HIGH (ref 0.61–1.24)
GFR calc Af Amer: 54 mL/min — ABNORMAL LOW
GFR calc non Af Amer: 46 mL/min — ABNORMAL LOW
Glucose, Bld: 161 mg/dL — ABNORMAL HIGH (ref 70–99)
Potassium: 3.9 mmol/L (ref 3.5–5.1)
Sodium: 136 mmol/L (ref 135–145)

## 2019-05-17 LAB — HEMOGLOBIN A1C
Hgb A1c MFr Bld: 6.8 % — ABNORMAL HIGH (ref 4.8–5.6)
Mean Plasma Glucose: 148 mg/dL

## 2019-05-17 LAB — GLUCOSE, CAPILLARY: Glucose-Capillary: 176 mg/dL — ABNORMAL HIGH (ref 70–99)

## 2019-05-17 MED ORDER — AMOXICILLIN-POT CLAVULANATE 875-125 MG PO TABS: 1.0000 | ORAL_TABLET | Freq: Two times a day (BID) | ORAL | 0 refills | Status: AC

## 2019-05-17 MED ORDER — HYDROCODONE-ACETAMINOPHEN 5-325 MG PO TABS: 1.0000 | ORAL_TABLET | Freq: Two times a day (BID) | ORAL | 0 refills | Status: DC | PRN

## 2019-05-17 MED ORDER — DOXYCYCLINE HYCLATE 100 MG PO TABS: 100.0000 mg | ORAL_TABLET | Freq: Two times a day (BID) | ORAL | 0 refills | Status: AC

## 2019-05-17 MED FILL — DOXYCYCLINE HYCLATE 100 MG: 100 | 9 days supply | Qty: 18 | Fill #0

## 2019-05-17 MED FILL — HYDROCODON-APAP 5-325: 5-325 | 7 days supply | Qty: 15 | Fill #0

## 2019-05-17 MED FILL — AMOX-CLAV 875-125 MG TABLET: 875-125 | 9 days supply | Qty: 18 | Fill #0

## 2019-05-17 NOTE — Progress Notes (Signed)
Pt is ambulating independently. Right great toe wound dry, swelling is down, no redness. Discharge instructions given to pt. Discharged to home picked up by family.

## 2019-05-17 NOTE — Discharge Instructions (Signed)

## 2019-05-17 NOTE — Discharge Summary (Addendum)
Discharge Summary  Brian Malone WJX:[id number] DOB: 07/01/1951  PCP: Doreatha Lew, MD  Admit date: 05/13/2019 Discharge date: 05/17/2019  Time spent: 35 minutes  Recommendations for Outpatient Follow-up:  1. Follow-up with Dr. Amalia Hailey, podiatry 2. Follow-up with your primary care provider 3. Follow-up with infectious disease 4. Take your medications as prescribed  Discharge Diagnoses:  Active Hospital Problems   Diagnosis Date Noted   Right foot ulcer ([place]) 05/13/2019   HIV (human immunodeficiency virus infection) ([place]) 05/13/2019   Diabetes ([place]) 04/16/2019   Cellulitis 04/16/2019    Resolved Hospital Problems  No resolved problems to display.    Discharge Condition: Stable  Diet recommendation: Resume previous diet  Vitals:   05/17/19 0415 05/17/19 1005  BP: 121/73   Pulse: (!) 55 62  Resp: 18   Temp: 97.7 F (36.5 C)   SpO2: 99% 99%    History of present illness:  Patient is a 68 year old male with history of HIV, diabetes mellitus, NIDDM, anxiety, CKD stage III, neuropathy presented to ED with right foot and great toe wound infection and pain worsening in the last 3 days. And, he had injury to his right great toe/foot with a mechanical fall in 03/2019, underwent ORIF of the right great toe on 4/15. Patient was recently admitted on 4/26 -4/29 for the possible infection of the surgical toe,was placed on IV antibiotics, then discharged on Bactrim and Keflex. Patient followed up with his podiatrist, Dr. Amalia Hailey, last seen on 05/09/2019, patient was noticed to have increased swelling and redness to the surgical toe in the past week. Due to concern for recurrent infection, he was placed on oral Keflex. Patient reports that despite taking Keflex for the past 5 days, he has been having worsening pain, redness to the right toe spreading to the right foot and ankle, purulent drainage from the top of the toe. He reported occasional shaking chills, generalized  malaise otherwise denied any nausea vomiting, chest pain or shortness of breath.  Admitted for R great toe cellulitis. S/p ORIF of R great toe on 4/15. Improved and resolving on IV abx.   Switched to Augmentin and doxycycline on 05/16/2019.  Completed 4 days of IV antibiotics Zosyn and IV vancomycin.  Will complete 10 days of oral antibiotics Augmentin and doxycycline.   05/17/19: Patient seen and examined at his bedside.  No acute events overnight.  Acute kidney injury improving with IV fluid hydration.  Patient advised to avoid dehydration and to follow-up with his primary care provider to monitor renal function.  Patient understands and agrees to plan.  He has no new concerns.  He wants to go home.  On the day of discharge, the patient was hemodynamically stable.  He will need to follow-up with his podiatrist, his PCP, and infectious disease posthospitalization.     Hospital Course:  Principal Problem:   Right foot ulcer ([place]) Active Problems:   Diabetes ([place])   Cellulitis   HIV (human immunodeficiency virus infection) ([place])  Resolving right great toe cellulitis with infection,surgical toe, recurrent:Outpatient failure to oral antibiotics -X-rays of the right foot, no evidence for osteomyelitis -per Dr. Amalia Hailey with podiatry, recommending continued IV abx, can d/c on oral abx once cellulitis improved -Completed 4 days of IV vancomycin and Zosyn (apparently did not tolerate flagyl at previous admission) switched to doxycycline and Augmentin on 05/16/2019. -Continue doxy/augmentin x9 days - Blood cx from 5/24 negative to date - ABI'snormal -Follow-up with podiatry Dr. Amalia Hailey  Resolving AKI on CKD 3 Baseline  creatinine appears to be 1.2 with GFR greater than 60 Today creatinine is 1.5 on 05/17/2019 from 1.60 yesterday improved on normal saline at 75 cc/h Continue to avoid nephrotoxic agents Follow-up with your primary care provider Repeat BMP on Monday  Diabetesmellitus type  II, NIDDM(H[facility]) -Continue to hold off metformin due to AKI -A1c 6.7 on 05/15/2019 -Resume glipizide -Follow-up with your primary care provider  HIV (human immunodeficiency virus infection) ([place]) -ContinueTriumeq -HIV RNA <20 01/19/2019 per care everywhere -Follow-up with infectious disease  GERD No acute issues Continue PPI  Hypertension Blood pressure is normotensive Continue Inderal  Anxiety, depression Continue Cymbalta, BuSpar, Vistaril as needed  BPH Continue home meds tamsulosin   Code Status:full     Consultants:  podiatry  Procedures:  none   Discharge Exam: BP 121/73 (BP Location: Left Arm)    Pulse 62    Temp 97.7 F (36.5 C) (Oral)    Resp 18    Ht 6' (1.829 m)    Wt 106.6 kg    SpO2 99%    BMI 31.87 kg/m   General: 68 y.o. year-old male well developed well nourished in no acute distress.  Alert and oriented x3.  Cardiovascular: Regular rate and rhythm with no rubs or gallops.  No thyromegaly or JVD noted.    Respiratory: Clear to auscultation with no wheezes or rales. Good inspiratory effort.  Abdomen: Soft nontender nondistended with normal bowel sounds x4 quadrants.  Musculoskeletal: No lower extremity edema. 2/4 pulses in all 4 extremities.  Skin: Right great toe with decreased swelling and resolved erythema.  Psychiatry: Mood is appropriate for condition and setting  Discharge Instructions You were cared for by a hospitalist during your hospital stay. If you have any questions about your discharge medications or the care you received while you were in the hospital after you are discharged, you can call the unit and asked to speak with the hospitalist on call if the hospitalist that took care of you is not available. Once you are discharged, your primary care physician will handle any further medical issues. Please note that NO REFILLS for any discharge medications will be authorized once you are discharged, as it is imperative  that you return to your primary care physician (or establish a relationship with a primary care physician if you do not have one) for your aftercare needs so that they can reassess your need for medications and monitor your lab values.   Allergies as of 05/17/2019      Reactions   Morphine Other (See Comments)   Itching       Medication List    STOP taking these medications   acetaminophen 500 MG tablet Commonly known as:  TYLENOL   cephALEXin 500 MG capsule Commonly known as:  KEFLEX   metFORMIN 500 MG tablet Commonly known as:  GLUCOPHAGE   oxyCODONE 5 MG immediate release tablet Commonly known as:  Roxicodone   sulfamethoxazole-trimethoprim 800-160 MG tablet Commonly known as:  BACTRIM DS     TAKE these medications   amoxicillin-clavulanate 875-125 MG tablet Commonly known as:  AUGMENTIN Take 1 tablet by mouth every 12 (twelve) hours for 9 days.   busPIRone 10 MG tablet Commonly known as:  BUSPAR Take 10 mg by mouth 3 (three) times daily.   doxycycline 100 MG tablet Commonly known as:  VIBRA-TABS Take 1 tablet (100 mg total) by mouth every 12 (twelve) hours for 9 days.   DULoxetine 30 MG capsule Commonly known as:  CYMBALTA Take  30 mg by mouth every evening.   gabapentin 300 MG capsule Commonly known as:  Neurontin Take 1 capsule (300 mg total) by mouth 3 (three) times daily. What changed:  how much to take   glipiZIDE 5 MG tablet Commonly known as:  GLUCOTROL Take 5 mg by mouth 2 (two) times a day.   HYDROcodone-acetaminophen 5-325 MG tablet Commonly known as:  NORCO/VICODIN Take 1 tablet by mouth 2 (two) times daily as needed for severe pain.   hydrOXYzine 50 MG capsule Commonly known as:  VISTARIL Take 50 mg by mouth daily as needed for anxiety or itching.   mirabegron ER 50 MG Tb24 tablet Commonly known as:  MYRBETRIQ Take 50 mg by mouth every evening.   pantoprazole 40 MG tablet Commonly known as:  PROTONIX Take 40 mg by mouth daily.     propranolol ER 80 MG 24 hr capsule Commonly known as:  INDERAL LA Take 80 mg by mouth daily.   rizatriptan 10 MG tablet Commonly known as:  MAXALT Take 10 mg by mouth as needed for migraine.   tamsulosin 0.4 MG Caps capsule Commonly known as:  FLOMAX Take 0.4 mg by mouth daily.   tiZANidine 4 MG tablet Commonly known as:  ZANAFLEX Take 4 mg by mouth at bedtime.   topiramate 50 MG tablet Commonly known as:  TOPAMAX Take 50 mg by mouth 2 (two) times daily.   Triumeq 600-50-300 MG tablet Generic drug:  abacavir-dolutegravir-lamiVUDine Take 1 tablet by mouth daily.   Vitamin B-12 5000 MCG Tbdp Take 5,000 mcg by mouth daily.   Vitamin D (Ergocalciferol) 1.25 MG (50000 UT) Caps capsule Commonly known as:  DRISDOL Take 50,000 Units by mouth every 7 (seven) days.      Allergies  Allergen Reactions   Morphine Other (See Comments)    Itching    Follow-up Information    [name], [name], MD. Call in 1 day(s).   Specialty:  Family Medicine Why:  Please call for a post hospital follow-up appointment Contact information: [place] [address] [phone]        Edrick Kins, DPM. Call in 1 day(s).   Specialty:  Podiatry Why:  Please call for a post hospital follow-up appointment Contact information: [place] [address] [phone]            The results of significant diagnostics from this hospitalization (including imaging, microbiology, ancillary and laboratory) are listed below for reference.    Significant Diagnostic Studies: Dg Tibia/fibula Right  Result Date: 05/13/2019 CLINICAL DATA:  Pain EXAM: RIGHT TIBIA AND FIBULA - 2 VIEW COMPARISON:  None. FINDINGS: No acute fracture or dislocation of the right tibia or fibula. There is plate and screw fixation of the distal right fibula. The partially imaged right knee and right ankle are unremarkable. Soft tissue edema about the right leg. IMPRESSION: No  acute fracture or dislocation of the right tibia or fibula. There is plate and screw fixation of the distal right fibula. The partially imaged right knee and right ankle are unremarkable. Soft tissue edema about the right leg. Electronically Signed   By: Eddie Candle M.D.   On: 05/13/2019 14:25   Dg Foot Complete Right  Result Date: 05/13/2019 CLINICAL DATA:  RIGHT foot pain onset 6 weeks ago. History of fracture with infection treated with IV antibiotics about 4 weeks ago. EXAM: RIGHT FOOT COMPLETE - 3+ VIEW COMPARISON:  Plain film of the RIGHT  foot dated 05/09/2019. FINDINGS: Fixation pin has been removed from the great toe. Associated healing fracture of the proximal phalanx with stable alignment compared to the previous plain film of 05/09/2019. Distal phalanx appears intact and normal in mineralization. Remainder of the osseous structures of the RIGHT foot appear intact and normal in mineralization. Soft tissues about the RIGHT foot are unremarkable. No soft tissue gas seen. IMPRESSION: No acute findings. No convincing evidence of osteomyelitis. No soft tissue gas. Healing fracture of the proximal phalanx of the RIGHT great toe, with stable alignment, status post fixation pin removal since plain film of 05/09/2019. Electronically Signed   By: Franki Cabot M.D.   On: 05/13/2019 14:23   Dg Foot Complete Right  Result Date: 05/09/2019 Please see detailed radiograph report in office note.  Vas [place] Burnard Bunting With/wo Tbi  Result Date: 05/15/2019 LOWER EXTREMITY DOPPLER STUDY Indications: Right toe ulcer. High Risk Factors: Diabetes.  Performing Technologist: June Leap RDMS, RVT  Examination Guidelines: A complete evaluation includes at minimum, Doppler waveform signals and systolic blood pressure reading at the level of bilateral brachial, anterior tibial, and posterior tibial arteries, when vessel segments are accessible. Bilateral testing is considered an integral part of a complete examination.  Photoelectric Plethysmograph (PPG) waveforms and toe systolic pressure readings are included as required and additional duplex testing as needed. Limited examinations for reoccurring indications may be performed as noted.  ABI Findings: +---------+------------------+-----+---------+---------------------------------+  Right     Rt Pressure (mmHg) Index Waveform  Comment                            +---------+------------------+-----+---------+---------------------------------+  Brachial  135                      triphasic                                    +---------+------------------+-----+---------+---------------------------------+  ATA       135                1.00  triphasic                                    +---------+------------------+-----+---------+---------------------------------+  PTA       162                1.20  triphasic                                    +---------+------------------+-----+---------+---------------------------------+  Great Toe                          Abnormal  Pressure could not be obtained                                                   due to pain in toe                 +---------+------------------+-----+---------+---------------------------------+ +---------+------------------+-----+---------+-------+  Left      Lt Pressure (mmHg) Index Waveform  Comment  +---------+------------------+-----+---------+-------+  Brachial  132                      triphasic          +---------+------------------+-----+---------+-------+  ATA       142                1.05  triphasic          +---------+------------------+-----+---------+-------+  PTA       160                1.19  triphasic          +---------+------------------+-----+---------+-------+  Great Toe                          Abnormal           +---------+------------------+-----+---------+-------+  Summary: Right: Resting right ankle-brachial index is within normal range. No evidence of significant right lower extremity arterial  disease. Left: Resting left ankle-brachial index is within normal range. No evidence of significant left lower extremity arterial disease.  *See table(s) above for measurements and observations.  Electronically signed by Servando Snare MD on 05/15/2019 at 4:25:57 PM.   Final     Microbiology: Recent Results (from the past 240 hour(s))  WOUND CULTURE     Status: None   Collection Time: 05/09/19 10:15 AM  Result Value Ref Range Status   MICRO NUMBER: [id number]  Final   SPECIMEN QUALITY: Adequate  Final   SOURCE: NOT GIVEN  Final   STATUS: FINAL  Final   GRAM STAIN:   Final    No organisms or white blood cells seen No epithelial cells seen   RESULT:   Final    Growth of skin flora (note: Growth does not include S. aureus, beta-hemolytic Streptococci or P. aeruginosa).  Culture, blood (single) w Reflex to ID Panel     Status: None (Preliminary result)   Collection Time: 05/13/19  2:35 PM  Result Value Ref Range Status   Specimen Description BLOOD LEFT ANTECUBITAL  Final   Special Requests   Final    BOTTLES DRAWN AEROBIC AND ANAEROBIC Blood Culture adequate volume   Culture   Final    NO GROWTH 3 DAYS Performed at [facility], [address] [place], [place] [address]    Report Status PENDING  Incomplete  SARS Coronavirus 2 (CEPHEID - Performed in [place] hospital lab), Hosp Order     Status: None   Collection Time: 05/13/19  2:35 PM  Result Value Ref Range Status   SARS Coronavirus 2 NEGATIVE NEGATIVE Final    Comment: (NOTE) If result is NEGATIVE SARS-CoV-2 target nucleic acids are NOT DETECTED. The SARS-CoV-2 RNA is generally detectable in upper and lower  respiratory specimens during the acute phase of infection. The lowest  concentration of SARS-CoV-2 viral copies this assay can detect is 250  copies / mL. A negative result does not preclude SARS-CoV-2 infection  and should not be used as the sole basis for treatment or other  patient management decisions.  A negative  result may occur with  improper specimen collection / handling, submission of specimen other  than nasopharyngeal swab, presence of viral mutation(s) within the  areas targeted by this assay, and inadequate number of viral copies  (<250 copies / mL). A negative result must be combined with clinical  observations, patient history, and epidemiological information. If result is POSITIVE SARS-CoV-2 target nucleic acids are DETECTED.  The SARS-CoV-2 RNA is generally detectable in upper and lower  respiratory specimens dur ing the acute phase of infection.  Positive  results are indicative of active infection with SARS-CoV-2.  Clinical  correlation with patient history and other diagnostic information is  necessary to determine patient infection status.  Positive results do  not rule out bacterial infection or co-infection with other viruses. If result is PRESUMPTIVE POSTIVE SARS-CoV-2 nucleic acids MAY BE PRESENT.   A presumptive positive result was obtained on the submitted specimen  and confirmed on repeat testing.  While 2019 novel coronavirus  (SARS-CoV-2) nucleic acids may be present in the submitted sample  additional confirmatory testing may be necessary for epidemiological  and / or clinical management purposes  to differentiate between  SARS-CoV-2 and other Sarbecovirus currently known to infect humans.  If clinically indicated additional testing with an alternate test  methodology [phone]) is advised. The SARS-CoV-2 RNA is generally  detectable in upper and lower respiratory sp ecimens during the acute  phase of infection. The expected result is Negative. Fact Sheet for Patients:  StrictlyIdeas.no Fact Sheet for Healthcare Providers: BankingDealers.co.za This test is not yet approved or cleared by the [place] FDA and has been authorized for detection and/or diagnosis of SARS-CoV-2 by FDA under an Emergency Use Authorization  (EUA).  This EUA will remain in effect (meaning this test can be used) for the duration of the COVID-19 declaration under Section 564(b)(1) of the Act, 21 U.S.C. section 360bbb-3(b)(1), unless the authorization is terminated or revoked sooner. Performed at [facility], [place] [address], [place], [place] [address]      Labs: Basic Metabolic Panel: Recent Labs  Lab 05/13/19 1435 05/14/19 0419 05/15/19 0320 05/16/19 0308 05/17/19 0221  NA 140 138 138 137 136  K 3.9 4.6 4.5 4.3 3.9  CL 107 109 107 107 106  CO2 23 22 23 22 22   GLUCOSE 107* 179* 197* 148* 161*  BUN 25* 20 23 25* 33*  CREATININE 1.27* 1.21 1.37* 1.60* 1.55*  CALCIUM 9.5 8.5* 9.1 9.1 9.1  MG  --   --  2.0  --   --    Liver Function Tests: Recent Labs  Lab 05/15/19 0320 05/16/19 0308  AST 15 13*  ALT 19 17  ALKPHOS 65 60  BILITOT 0.8 0.9  PROT 6.1* 6.3*  ALBUMIN 3.3* 3.5   No results for input(s): LIPASE, AMYLASE in the last 168 hours. No results for input(s): AMMONIA in the last 168 hours. CBC: Recent Labs  Lab 05/13/19 1435 05/14/19 0419 05/15/19 0320 05/16/19 0308  WBC 5.6 4.0 4.1 4.6  HGB 13.7 11.4* 11.9* 12.8*  HCT 41.4 34.4* 35.5* 38.2*  MCV 103.8* 104.6* 103.8* 102.7*  PLT 154 128* 133* 151   Cardiac Enzymes: No results for input(s): CKTOTAL, CKMB, CKMBINDEX, TROPONINI in the last 168 hours. BNP: BNP (last 3 results) No results for input(s): BNP in the last 8760 hours.  ProBNP (last 3 results) No results for input(s): PROBNP in the last 8760 hours.  CBG: Recent Labs  Lab 05/16/19 0815 05/16/19 1225 05/16/19 1733 05/16/19 2016 05/17/19 0734  GLUCAP 183* 134* 123* 140* 176*       Signed:  Kayleen Memos, MD [facility] 05/17/2019, 10:43 AM

## 2019-05-17 NOTE — Evaluation (Signed)
Physical Therapy Evaluation and Discharge Patient Details Name: Brian Malone MRN: [id number] DOB: [birth year] Today's Date: 05/17/2019   History of Present Illness  Pt is a 68 y/o male admitted secondary to worsening pain and swelling of R foot secondary to infection. Pt with recent ORIF of great toe on the R. PMH includes DM and HIV.   Clinical Impression  Patient evaluated by Physical Therapy with no further acute PT needs identified. All education has been completed and the patient has no further questions. Pt at a mod I to supervision level with gait and stair navigation. No overt LOB noted. Pt educated about safety when ascending and descending steps.  See below for any follow-up Physical Therapy or equipment needs. PT is signing off. Thank you for this referral. If needs change, please re-consult.      Follow Up Recommendations No PT follow up    Equipment Recommendations  None recommended by PT    Recommendations for Other Services       Precautions / Restrictions Precautions Precautions: None Required Braces or Orthoses: Other Brace Other Brace: pt with post op shoe  Restrictions Weight Bearing Restrictions: No      Mobility  Bed Mobility Overal bed mobility: Independent                Transfers Overall transfer level: Independent                  Ambulation/Gait Ambulation/Gait assistance: Modified independent (Device/Increase time) Gait Distance (Feet): 300 Feet Assistive device: None Gait Pattern/deviations: Step-through pattern;Decreased stride length;Decreased weight shift to right Gait velocity: Decreased    General Gait Details: Slower, slightly antalgic gait. Overall steady, and pt reports he is at baseline for ambulation.   Stairs Stairs: Yes Stairs assistance: Supervision Stair Management: One rail Right;Step to pattern;Forwards Number of Stairs: 13 General stair comments: Overall steady stair navigation with use of rails. Cues  for LE sequencing for ascending and descending steps. Supervision for safety.   Wheelchair Mobility    Modified Rankin (Stroke Patients Only)       Balance Overall balance assessment: No apparent balance deficits (not formally assessed)                                           Pertinent Vitals/Pain Pain Assessment: Faces Faces Pain Scale: Hurts little more Pain Location: R foot  Pain Descriptors / Indicators: Aching;Grimacing;Guarding Pain Intervention(s): Limited activity within patient's tolerance;Monitored during session;Repositioned    Home Living Family/patient expects to be discharged to:: Private residence Living Arrangements: Alone   Type of Home: Apartment Home Access: Stairs to enter Entrance Stairs-Rails: Psychiatric nurse of Steps: 3 flights Home Layout: One level Home Equipment: Cane - single point      Prior Function Level of Independence: Independent               Hand Dominance        Extremity/Trunk Assessment   Upper Extremity Assessment Upper Extremity Assessment: Overall WFL for tasks assessed    Lower Extremity Assessment Lower Extremity Assessment: RLE deficits/detail RLE Deficits / Details: Deficits consistent with post op pain and weakness in R foot.     Cervical / Trunk Assessment Cervical / Trunk Assessment: Normal  Communication   Communication: No difficulties  Cognition Arousal/Alertness: Awake/alert Behavior During Therapy: WFL for tasks assessed/performed Overall Cognitive Status: Within Functional Limits  for tasks assessed                                        General Comments      Exercises     Assessment/Plan    PT Assessment Patent does not need any further PT services  PT Problem List         PT Treatment Interventions      PT Goals (Current goals can be found in the Care Plan section)  Acute Rehab PT Goals Patient Stated Goal: to go home  today PT Goal Formulation: All assessment and education complete, DC therapy Time For Goal Achievement: 05/17/19 Potential to Achieve Goals: Good    Frequency     Barriers to discharge        Co-evaluation               AM-PAC PT "6 Clicks" Mobility  Outcome Measure Help needed turning from your back to your side while in a flat bed without using bedrails?: None Help needed moving from lying on your back to sitting on the side of a flat bed without using bedrails?: None Help needed moving to and from a bed to a chair (including a wheelchair)?: None Help needed standing up from a chair using your arms (e.g., wheelchair or bedside chair)?: None Help needed to walk in hospital room?: None Help needed climbing 3-5 steps with a railing? : None 6 Click Score: 24    End of Session   Activity Tolerance: Patient tolerated treatment well Patient left: with call bell/phone within reach;in chair Nurse Communication: Mobility status PT Visit Diagnosis: Other abnormalities of gait and mobility (R26.89)    Time: 4010-2725 PT Time Calculation (min) (ACUTE ONLY): 10 min   Charges:   PT Evaluation $PT Eval Low Complexity: [place], PT, DPT  Acute Rehabilitation Services  Pager: [phone] Office: [phone]   Rudean Hitt 05/17/2019, 1:57 PM

## 2019-05-18 DIAGNOSIS — M4802 Spinal stenosis, cervical region: Secondary | ICD-10-CM | POA: Insufficient documentation

## 2019-05-18 DIAGNOSIS — M47816 Spondylosis without myelopathy or radiculopathy, lumbar region: Secondary | ICD-10-CM | POA: Insufficient documentation

## 2019-05-18 LAB — CULTURE, BLOOD (SINGLE)
Culture: NO GROWTH
Special Requests: ADEQUATE

## 2019-05-21 ENCOUNTER — Other Ambulatory Visit: Payer: Medicare Other | Admitting: Orthotics

## 2019-05-23 ENCOUNTER — Ambulatory Visit (INDEPENDENT_AMBULATORY_CARE_PROVIDER_SITE_OTHER): Payer: Medicare Other | Admitting: Podiatry

## 2019-05-23 ENCOUNTER — Other Ambulatory Visit: Payer: Self-pay

## 2019-05-23 ENCOUNTER — Encounter: Payer: Self-pay | Admitting: Podiatry

## 2019-05-23 VITALS — Temp 97.9°F

## 2019-05-23 DIAGNOSIS — S92401A Displaced unspecified fracture of right great toe, initial encounter for closed fracture: Secondary | ICD-10-CM

## 2019-05-23 DIAGNOSIS — L03031 Cellulitis of right toe: Secondary | ICD-10-CM

## 2019-05-23 DIAGNOSIS — Z09 Encounter for follow-up examination after completed treatment for conditions other than malignant neoplasm: Secondary | ICD-10-CM

## 2019-05-26 NOTE — Progress Notes (Signed)
   Subjective:  68 year old male with PMHx of HIV and DM presents today status post ORIF of the right hallux. DOS: 05/13/2019. He states he is doing well and improving. He denies any drainage but reports some mild redness. He has been on antibiotics since being discharged from the hospital. He has been applying antibiotic ointment daily with a bandage as well as using the post op shoe. There are no worsening factors noted. Patient is here for further evaluation and treatment.   Past Medical History:  Diagnosis Date  . Anxiety   . Diabetes mellitus without complication ([place])   . HIV (human immunodeficiency virus infection) ([place])   . Hypertension   . Renal disorder       Objective/Physical Exam Neurovascular status intact. Small superficial area of dehiscence noted to the right hallux. Mild erythema noted. No active bleeding noted. Moderate edema noted to the surgical extremity.  Assessment: 1. s/p ORIF right hallux. DOS: 05/13/2019 2. Right hallux cellulitis - improved    Plan of Care:  1. Patient was evaluated. 2. Continue taking oral antibiotics as per discharge instructions from the hospital.  3. Continue using antibiotic ointment daily with a bandage.  4. Continue using post op shoe. Weightbearing as tolerated.  5. Return to clinic in 4 weeks.    Edrick Kins, DPM [facility]  Dr. Edrick Kins, [address]                                        [place], [place] [address]                Office [phone]  Fax [phone]

## 2019-06-04 ENCOUNTER — Other Ambulatory Visit: Payer: Self-pay

## 2019-06-04 ENCOUNTER — Ambulatory Visit: Payer: Medicare Other | Admitting: Orthotics

## 2019-06-04 DIAGNOSIS — M659 Synovitis and tenosynovitis, unspecified: Secondary | ICD-10-CM

## 2019-06-04 DIAGNOSIS — S92401A Displaced unspecified fracture of right great toe, initial encounter for closed fracture: Secondary | ICD-10-CM

## 2019-06-04 DIAGNOSIS — L03031 Cellulitis of right toe: Secondary | ICD-10-CM

## 2019-06-04 NOTE — Progress Notes (Signed)

## 2019-06-19 ENCOUNTER — Other Ambulatory Visit: Payer: Self-pay

## 2019-06-19 ENCOUNTER — Inpatient Hospital Stay (HOSPITAL_BASED_OUTPATIENT_CLINIC_OR_DEPARTMENT_OTHER)
Admission: EM | Admit: 2019-06-19 | Discharge: 2019-06-28 | DRG: 682 | Disposition: A | Discharge status: Home Health | Payer: Medicare Other | Source: Other Acute Inpatient Hospital | Attending: Family Medicine | Admitting: Family Medicine

## 2019-06-19 ENCOUNTER — Emergency Department (HOSPITAL_BASED_OUTPATIENT_CLINIC_OR_DEPARTMENT_OTHER): Payer: Medicare Other

## 2019-06-19 ENCOUNTER — Encounter (HOSPITAL_BASED_OUTPATIENT_CLINIC_OR_DEPARTMENT_OTHER): Payer: Self-pay

## 2019-06-19 DIAGNOSIS — E119 Type 2 diabetes mellitus without complications: Secondary | ICD-10-CM

## 2019-06-19 DIAGNOSIS — Z21 Asymptomatic human immunodeficiency virus [HIV] infection status: Secondary | ICD-10-CM | POA: Diagnosis present

## 2019-06-19 DIAGNOSIS — E872 Acidosis, unspecified: Secondary | ICD-10-CM | POA: Diagnosis present

## 2019-06-19 DIAGNOSIS — J189 Pneumonia, unspecified organism: Secondary | ICD-10-CM | POA: Diagnosis present

## 2019-06-19 DIAGNOSIS — N179 Acute kidney failure, unspecified: Principal | ICD-10-CM | POA: Diagnosis present

## 2019-06-19 DIAGNOSIS — R1313 Dysphagia, pharyngeal phase: Secondary | ICD-10-CM | POA: Diagnosis not present

## 2019-06-19 DIAGNOSIS — I129 Hypertensive chronic kidney disease with stage 1 through stage 4 chronic kidney disease, or unspecified chronic kidney disease: Secondary | ICD-10-CM | POA: Diagnosis not present

## 2019-06-19 DIAGNOSIS — F419 Anxiety disorder, unspecified: Secondary | ICD-10-CM | POA: Diagnosis not present

## 2019-06-19 DIAGNOSIS — E860 Dehydration: Secondary | ICD-10-CM | POA: Diagnosis present

## 2019-06-19 DIAGNOSIS — E1122 Type 2 diabetes mellitus with diabetic chronic kidney disease: Secondary | ICD-10-CM | POA: Diagnosis not present

## 2019-06-19 DIAGNOSIS — Y95 Nosocomial condition: Secondary | ICD-10-CM | POA: Diagnosis present

## 2019-06-19 DIAGNOSIS — R1310 Dysphagia, unspecified: Secondary | ICD-10-CM

## 2019-06-19 DIAGNOSIS — I10 Essential (primary) hypertension: Secondary | ICD-10-CM | POA: Diagnosis present

## 2019-06-19 DIAGNOSIS — N183 Chronic kidney disease, stage 3 (moderate): Secondary | ICD-10-CM | POA: Diagnosis not present

## 2019-06-19 DIAGNOSIS — Z1159 Encounter for screening for other viral diseases: Secondary | ICD-10-CM

## 2019-06-19 DIAGNOSIS — R17 Unspecified jaundice: Secondary | ICD-10-CM | POA: Diagnosis not present

## 2019-06-19 DIAGNOSIS — R1312 Dysphagia, oropharyngeal phase: Secondary | ICD-10-CM | POA: Diagnosis present

## 2019-06-19 LAB — CBC WITH DIFFERENTIAL/PLATELET
Abs Immature Granulocytes: 0.1 10*3/uL — ABNORMAL HIGH (ref 0.00–0.07)
Basophils Absolute: 0 10*3/uL (ref 0.0–0.1)
Basophils Relative: 0 %
Eosinophils Absolute: 0.1 10*3/uL (ref 0.0–0.5)
Eosinophils Relative: 0 %
HCT: 40.2 % (ref 39.0–52.0)
Hemoglobin: 13.4 g/dL (ref 13.0–17.0)
Immature Granulocytes: 1 %
Lymphocytes Relative: 7 %
Lymphs Abs: 1 10*3/uL (ref 0.7–4.0)
MCH: 34.1 pg — ABNORMAL HIGH (ref 26.0–34.0)
MCHC: 33.3 g/dL (ref 30.0–36.0)
MCV: 102.3 fL — ABNORMAL HIGH (ref 80.0–100.0)
Monocytes Absolute: 1.8 10*3/uL — ABNORMAL HIGH (ref 0.1–1.0)
Monocytes Relative: 12 %
Neutro Abs: 11.8 10*3/uL — ABNORMAL HIGH (ref 1.7–7.7)
Neutrophils Relative %: 80 %
Platelets: 174 10*3/uL (ref 150–400)
RBC: 3.93 MIL/uL — ABNORMAL LOW (ref 4.22–5.81)
RDW: 13.2 % (ref 11.5–15.5)
Smear Review: NORMAL
WBC: 14.8 10*3/uL — ABNORMAL HIGH (ref 4.0–10.5)
nRBC: 0 % (ref 0.0–0.2)

## 2019-06-19 LAB — COMPREHENSIVE METABOLIC PANEL
ALT: 13 U/L (ref 0–44)
AST: 23 U/L (ref 15–41)
Albumin: 4 g/dL (ref 3.5–5.0)
Alkaline Phosphatase: 59 U/L (ref 38–126)
Anion gap: 15 (ref 5–15)
BUN: 47 mg/dL — ABNORMAL HIGH (ref 8–23)
CO2: 19 mmol/L — ABNORMAL LOW (ref 22–32)
Calcium: 9.2 mg/dL (ref 8.9–10.3)
Chloride: 101 mmol/L (ref 98–111)
Creatinine, Ser: 2.78 mg/dL — ABNORMAL HIGH (ref 0.61–1.24)
GFR calc Af Amer: 26 mL/min — ABNORMAL LOW (ref >60)
GFR calc non Af Amer: 23 mL/min — ABNORMAL LOW (ref >60)
Glucose, Bld: 244 mg/dL — ABNORMAL HIGH (ref 70–99)
Potassium: 4.8 mmol/L (ref 3.5–5.1)
Sodium: 135 mmol/L (ref 135–145)
Total Bilirubin: 1.9 mg/dL — ABNORMAL HIGH (ref 0.3–1.2)
Total Protein: 7.2 g/dL (ref 6.5–8.1)

## 2019-06-19 LAB — CBG MONITORING, ED: Glucose-Capillary: 196 mg/dL — ABNORMAL HIGH (ref 70–99)

## 2019-06-19 LAB — SARS CORONAVIRUS 2 AG (30 MIN TAT): SARS Coronavirus 2 Ag: NEGATIVE

## 2019-06-19 MED ORDER — SODIUM CHLORIDE 0.9 % IV BOLUS
1000.0000 mL | Freq: Once | INTRAVENOUS | Status: AC
  Administered 2019-06-19: 1000 mL via INTRAVENOUS

## 2019-06-19 MED ORDER — DEXAMETHASONE SODIUM PHOSPHATE 10 MG/ML IJ SOLN
10.0000 mg | Freq: Once | INTRAMUSCULAR | Status: AC
  Administered 2019-06-19: 10 mg via INTRAVENOUS
  Filled 2019-06-19: qty 1

## 2019-06-19 MED ORDER — LEVOFLOXACIN IN D5W 500 MG/100ML IV SOLN
500.0000 mg | Freq: Once | INTRAVENOUS | Status: AC
  Administered 2019-06-19: 500 mg via INTRAVENOUS
  Filled 2019-06-19: qty 100

## 2019-06-19 MED ORDER — FENTANYL CITRATE (PF) 100 MCG/2ML IJ SOLN
100.0000 ug | Freq: Once | INTRAMUSCULAR | Status: AC
  Administered 2019-06-19: 100 ug via INTRAVENOUS
  Filled 2019-06-19: qty 2

## 2019-06-19 NOTE — ED Notes (Signed)
Patient transported to X-ray 

## 2019-06-19 NOTE — Progress Notes (Signed)
68 yo male with hiv, hypertension, dm2, w recent neck surgery 06/12/2019 at [facility] has c/o dysphagia, x1 week. Pt has had difficulty swallowing solid food.  Pt tx with decadron for ? Throat swelling, which helped in hospital.   ER Dr. Billy Fischer spoke with [facility] neurosurgery Arsenio Katz) who apparently said that sometimes dysphagia can occur with the surgery ? ,  Recommended speech therapy ?  Pt is found to have ARF Bun 47, Creatinine 2.78 as well as left lower lung pneumonia   ED requesting admission for above.

## 2019-06-19 NOTE — ED Notes (Signed)
[place] PAL for Dr. Arsenio Katz

## 2019-06-19 NOTE — ED Provider Notes (Signed)
[facility] Provider Note   CSN: [id number] Arrival date & time: 06/19/19  1530    History   Chief Complaint Chief Complaint  Patient presents with  . Fatigue  . Dysphagia    HPI Brian Malone is a 68 y.o. male.     HPI   Hx of discectomy 1 week ago Reports he has been unable to eat or drink for the last week. Since Tuesday has had 3 glucernas and one sprite, a little bit of water.  Reports gagging and coughing with trying to eat, hurting severely in back of neck.  Took a pain pill to see if it helped stop coughing but didn't.  Had 2 pain pills.  Will cough when trying to eat or drink.  No fever. No nausea or vomiting.  No loss of smell.  Glucose up even though he hasnt been eating.  Was going to see endocrinologist. Voice has been waxing and waning. Reports steroids at the hospital helped tremendously and was able to swallow.  Now worried not getting enough fluid down.    Past Medical History:  Diagnosis Date  . Anxiety   . Diabetes mellitus without complication ([place])   . HIV (human immunodeficiency virus infection) ([place])   . Hypertension   . Renal disorder     Patient Active Problem List   Diagnosis Date Noted  . Cervical spinal stenosis 05/18/2019  . Lumbar spondylosis 05/18/2019  . Sacroiliitis ([place]) 05/15/2019  . Right foot ulcer ([place]) 05/13/2019  . HIV (human immunodeficiency virus infection) ([place]) 05/13/2019  . Tachycardia 04/16/2019  . Diabetes ([place]) 04/16/2019  . Cellulitis 04/16/2019  . Wound infection after surgery 04/15/2019  . Benign prostatic hyperplasia (BPH) with straining on urination 02/22/2019  . Essential hypertension 02/22/2019  . Post-traumatic osteoarthritis of both ankles 02/22/2019  . Trigeminal neuralgia 02/22/2019  . Uncontrolled type 2 diabetes mellitus with hyperglycemia ([place]) 02/22/2019  . Chronic bilateral low back pain with bilateral sciatica 01/19/2019  . Diabetic autonomic neuropathy associated  with type 2 diabetes mellitus ([place]) 01/19/2019  . Migraine with aura and without status migrainosus, not intractable 01/19/2019  . Neck pain 01/19/2019  . Neuropathy of both feet 01/19/2019  . Nocturia more than twice per night 01/07/2015  . Anxiety 10/23/2014  . Candida, oral 10/23/2014  . Depression 10/23/2014  . Insomnia 10/23/2014  . Rash of entire body 10/23/2014    Past Surgical History:  Procedure Laterality Date  . ANKLE ARTHROSCOPY    . APPENDECTOMY    . BACK SURGERY     FUSION  . THORACOTOMY Right 2008  ?  . TONSILLECTOMY          Home Medications    Prior to Admission medications   Medication Sig Start Date End Date Taking? Authorizing Provider  busPIRone (BUSPAR) 10 MG tablet Take 10 mg by mouth 3 (three) times daily.  01/22/19   [provider]  Cyanocobalamin (VITAMIN B-12) 5000 MCG TBDP Take 5,000 mcg by mouth daily.     [provider]  DULoxetine (CYMBALTA) 30 MG capsule Take 30 mg by mouth every evening.  01/22/19   [provider]  gabapentin (NEURONTIN) 300 MG capsule Take 1 capsule (300 mg total) by mouth 3 (three) times daily. Patient taking differently: Take 600 mg by mouth 3 (three) times daily.  02/10/19   Carmin Muskrat, MD  glipiZIDE (GLUCOTROL) 5 MG tablet Take 5 mg by mouth 2 (two) times a day.     [provider]  HYDROcodone-acetaminophen (NORCO/VICODIN) 5-325 MG tablet Take 1 tablet by mouth 2 (two) times daily as needed for severe pain. 05/17/19   Kayleen Memos, DO  hydrOXYzine (VISTARIL) 50 MG capsule Take 50 mg by mouth daily as needed for anxiety or itching.  01/22/19   [provider]  mirabegron ER (MYRBETRIQ) 50 MG TB24 tablet Take 50 mg by mouth every evening.     [provider]  pantoprazole (PROTONIX) 40 MG tablet Take 40 mg by mouth daily.  02/12/19   [provider]  propranolol ER (INDERAL LA) 80 MG 24 hr capsule Take 80 mg by mouth daily.  02/12/19   [provider]   rizatriptan (MAXALT) 10 MG tablet Take 10 mg by mouth as needed for migraine.  03/21/19   [provider]  tamsulosin (FLOMAX) 0.4 MG CAPS capsule Take 0.4 mg by mouth daily.  02/12/19   [provider]  tiZANidine (ZANAFLEX) 4 MG tablet Take 4 mg by mouth at bedtime.  03/08/19   [provider]  topiramate (TOPAMAX) 50 MG tablet Take 50 mg by mouth 2 (two) times daily.  03/21/19   [provider]  TRIUMEQ 600-50-300 MG tablet Take 1 tablet by mouth daily.  02/12/19   [provider]  Vitamin D, Ergocalciferol, (DRISDOL) 1.25 MG (50000 UT) CAPS capsule Take 50,000 Units by mouth every 7 (seven) days.  03/21/19   [provider]    Family History Family History  Problem Relation Age of Onset  . CAD Mother   . Lung cancer Mother   . CAD Father   . Lung cancer Father   . CAD Brother     Social History Social History   Tobacco Use  . Smoking status: Never Smoker  . Smokeless tobacco: Never Used  Substance Use Topics  . Alcohol use: Not Currently    Frequency: Never  . Drug use: Never     Allergies   Morphine   Review of Systems Review of Systems  Constitutional: Negative for fever.  HENT: Positive for trouble swallowing. Negative for sore throat.   Eyes: Negative for visual disturbance.  Respiratory: Positive for cough. Negative for shortness of breath.   Cardiovascular: Negative for chest pain.  Gastrointestinal: Negative for abdominal pain, nausea and vomiting.  Genitourinary: Negative for difficulty urinating.  Musculoskeletal: Negative for back pain and neck stiffness.  Skin: Negative for rash.  Neurological: Negative for syncope and headaches.     Physical Exam Updated Vital Signs BP 111/63   Pulse 79   Temp 98 F (36.7 C) (Oral)   Resp 15   Ht 6\' 2"  (1.88 m)   Wt 99.8 kg   SpO2 100%   BMI 28.25 kg/m   Physical Exam Vitals signs and nursing note reviewed.  Constitutional:      General: He is not in acute  distress.    Appearance: He is well-developed. He is not diaphoretic.  HENT:     Head: Normocephalic and atraumatic.     Mouth/Throat:     Mouth: Mucous membranes are dry.  Eyes:     Conjunctiva/sclera: Conjunctivae normal.  Neck:     Musculoskeletal: Normal range of motion.     Comments: In aspen collar, incision C/D/I, no erythema, no fluctuance Cardiovascular:     Rate and Rhythm: Normal rate and regular rhythm.  Pulmonary:     Effort: Pulmonary effort is normal. No respiratory distress.  Abdominal:     General: There is  no distension.     Palpations: Abdomen is soft.  Skin:    General: Skin is warm and dry.  Neurological:     Mental Status: He is alert and oriented to person, place, and time.      ED Treatments / Results  Labs (all labs ordered are listed, but only abnormal results are displayed) Labs Reviewed  CBC WITH DIFFERENTIAL/PLATELET - Abnormal; Notable for the following components:      Result Value   WBC 14.8 (*)    RBC 3.93 (*)    MCV 102.3 (*)    MCH 34.1 (*)    Neutro Abs 11.8 (*)    Monocytes Absolute 1.8 (*)    Abs Immature Granulocytes 0.10 (*)    All other components within normal limits  COMPREHENSIVE METABOLIC PANEL - Abnormal; Notable for the following components:   CO2 19 (*)    Glucose, Bld 244 (*)    BUN 47 (*)    Creatinine, Ser 2.78 (*)    Total Bilirubin 1.9 (*)    GFR calc non Af Amer 23 (*)    GFR calc Af Amer 26 (*)    All other components within normal limits  SARS CORONAVIRUS 2 (HOSP ORDER, PERFORMED IN [facility] VIA [facility]BBOTT ID)    EKG None  Radiology Dg Chest 2 View  Result Date: 06/19/2019 CLINICAL DATA:  Cough. Weakness and difficulty swallowing. Recent neck surgery. EXAM: CHEST - 2 VIEW COMPARISON:  Chest CTA 04/15/2019 and radiographs 05/09/2018 FINDINGS: The cardiomediastinal silhouette is unchanged with normal heart size. There is new left basilar airspace opacity which likely involves both the lower lobe and  lingula. The right lung is clear. No pleural effusion or pneumothorax is identified. No acute osseous abnormality is seen. IMPRESSION: Left basilar pneumonia. Electronically Signed   By: Logan Bores M.D.   On: 06/19/2019 19:07    Procedures Procedures (including critical care time)  Medications Ordered in ED Medications  levofloxacin (LEVAQUIN) IVPB 500 mg (500 mg Intravenous New Bag/Given 06/19/19 1940)  sodium chloride 0.9 % bolus 1,000 mL (0 mLs Intravenous Stopped 06/19/19 1854)  dexamethasone (DECADRON) injection 10 mg (10 mg Intravenous Given 06/19/19 1732)  sodium chloride 0.9 % bolus 1,000 mL (1,000 mLs Intravenous New Bag/Given 06/19/19 1858)     Initial Impression / Assessment and Plan / ED Course  I have reviewed the triage vital signs and the nursing notes.  Pertinent labs & imaging results that were available during my care of the patient were reviewed by me and considered in my medical decision making (see chart for details).        68 year old male with a history of recent C3-4 and C4-5 anterior discectomy with Dr. Octavio Manns [facility], DM, HIV, hypertension, presents with concern for dysphagia and dehydration.  Patient had dysphasia following his surgery that initially improved with Decadron, however has now worsened and he has had poor p.o. intake.  Discussed his presentation with Dr. Octavio Manns who felt that while he was having worse dysphagia then most, that his symptoms are not unexpected after this type of surgery.  The patient reports the swelling is decreasing anteriorly, has no erythema of incision, no fever, and doubt acute surgical site infection or abscess.  Dr. Octavio Manns recommends decadron to help with post-surgical swelling and inflammation.  He does not feel the patient needs to specifically be admitted to [facility] or see them at this time.   Labs significant for AKI and given decreased po suspect secondary  to dehydration. Will admit for rehydration, speech eval.   Reports cough with eating and today has had independent cough. CXR does show concern for pneumonia. Will give levaquin. Pt to be admitted for further care.   Final Clinical Impressions(s) / ED Diagnoses   Final diagnoses:  Oropharyngeal dysphagia  AKI (acute kidney injury) ([place])  Dehydration  Healthcare-associated pneumonia    ED Discharge Orders    None       Gareth Morgan, MD 06/19/19 [phone]

## 2019-06-19 NOTE — ED Notes (Signed)
Report to carelink.  

## 2019-06-19 NOTE — ED Triage Notes (Addendum)
Pt had neck surgery one week ago, increased weakness and difficulty swallowing for one week.  Able to take liquids, but increasing difficulty with solid foods.  Had decadron for throat swelling upon discharge, but not improving.  Has not notified surgeon. Arrives with c-collar in place

## 2019-06-20 ENCOUNTER — Encounter: Payer: Medicare Other | Admitting: Podiatry

## 2019-06-20 ENCOUNTER — Other Ambulatory Visit: Payer: Self-pay

## 2019-06-20 ENCOUNTER — Inpatient Hospital Stay (HOSPITAL_COMMUNITY): Payer: Medicare Other

## 2019-06-20 ENCOUNTER — Observation Stay (HOSPITAL_COMMUNITY): Payer: Medicare Other

## 2019-06-20 ENCOUNTER — Encounter (HOSPITAL_COMMUNITY): Payer: Self-pay | Admitting: *Deleted

## 2019-06-20 DIAGNOSIS — R1310 Dysphagia, unspecified: Secondary | ICD-10-CM

## 2019-06-20 DIAGNOSIS — E872 Acidosis, unspecified: Secondary | ICD-10-CM | POA: Diagnosis present

## 2019-06-20 DIAGNOSIS — N183 Chronic kidney disease, stage 3 (moderate): Secondary | ICD-10-CM | POA: Diagnosis present

## 2019-06-20 DIAGNOSIS — J189 Pneumonia, unspecified organism: Secondary | ICD-10-CM | POA: Diagnosis present

## 2019-06-20 DIAGNOSIS — I10 Essential (primary) hypertension: Secondary | ICD-10-CM | POA: Diagnosis not present

## 2019-06-20 DIAGNOSIS — E860 Dehydration: Secondary | ICD-10-CM | POA: Diagnosis present

## 2019-06-20 DIAGNOSIS — F419 Anxiety disorder, unspecified: Secondary | ICD-10-CM | POA: Diagnosis present

## 2019-06-20 DIAGNOSIS — Z21 Asymptomatic human immunodeficiency virus [HIV] infection status: Secondary | ICD-10-CM | POA: Diagnosis present

## 2019-06-20 DIAGNOSIS — R17 Unspecified jaundice: Secondary | ICD-10-CM | POA: Diagnosis present

## 2019-06-20 DIAGNOSIS — E1122 Type 2 diabetes mellitus with diabetic chronic kidney disease: Secondary | ICD-10-CM | POA: Diagnosis present

## 2019-06-20 DIAGNOSIS — I129 Hypertensive chronic kidney disease with stage 1 through stage 4 chronic kidney disease, or unspecified chronic kidney disease: Secondary | ICD-10-CM | POA: Diagnosis present

## 2019-06-20 DIAGNOSIS — R1313 Dysphagia, pharyngeal phase: Secondary | ICD-10-CM | POA: Diagnosis present

## 2019-06-20 DIAGNOSIS — E0822 Diabetes mellitus due to underlying condition with diabetic chronic kidney disease: Secondary | ICD-10-CM | POA: Diagnosis not present

## 2019-06-20 DIAGNOSIS — N179 Acute kidney failure, unspecified: Secondary | ICD-10-CM | POA: Diagnosis present

## 2019-06-20 DIAGNOSIS — Z1159 Encounter for screening for other viral diseases: Secondary | ICD-10-CM | POA: Diagnosis not present

## 2019-06-20 DIAGNOSIS — R1312 Dysphagia, oropharyngeal phase: Secondary | ICD-10-CM | POA: Diagnosis present

## 2019-06-20 DIAGNOSIS — Y95 Nosocomial condition: Secondary | ICD-10-CM | POA: Diagnosis present

## 2019-06-20 LAB — GLUCOSE, CAPILLARY
Glucose-Capillary: 208 mg/dL — ABNORMAL HIGH (ref 70–99)
Glucose-Capillary: 168 mg/dL — ABNORMAL HIGH (ref 70–99)
Glucose-Capillary: 141 mg/dL — ABNORMAL HIGH (ref 70–99)
Glucose-Capillary: 140 mg/dL — ABNORMAL HIGH (ref 70–99)
Glucose-Capillary: 131 mg/dL — ABNORMAL HIGH (ref 70–99)

## 2019-06-20 LAB — BASIC METABOLIC PANEL
Anion gap: 13 (ref 5–15)
BUN: 37 mg/dL — ABNORMAL HIGH (ref 8–23)
CO2: 19 mmol/L — ABNORMAL LOW (ref 22–32)
Calcium: 9 mg/dL (ref 8.9–10.3)
Chloride: 105 mmol/L (ref 98–111)
Creatinine, Ser: 2.01 mg/dL — ABNORMAL HIGH (ref 0.61–1.24)
GFR calc Af Amer: 39 mL/min — ABNORMAL LOW (ref >60)
GFR calc non Af Amer: 34 mL/min — ABNORMAL LOW (ref >60)
Glucose, Bld: 215 mg/dL — ABNORMAL HIGH (ref 70–99)
Potassium: 4.4 mmol/L (ref 3.5–5.1)
Sodium: 137 mmol/L (ref 135–145)

## 2019-06-20 LAB — CBC
HCT: 34.8 % — ABNORMAL LOW (ref 39.0–52.0)
Hemoglobin: 12 g/dL — ABNORMAL LOW (ref 13.0–17.0)
MCH: 34.8 pg — ABNORMAL HIGH (ref 26.0–34.0)
MCHC: 34.5 g/dL (ref 30.0–36.0)
MCV: 100.9 fL — ABNORMAL HIGH (ref 80.0–100.0)
Platelets: 158 10*3/uL (ref 150–400)
RBC: 3.45 MIL/uL — ABNORMAL LOW (ref 4.22–5.81)
RDW: 13.2 % (ref 11.5–15.5)
WBC: 11 10*3/uL — ABNORMAL HIGH (ref 4.0–10.5)
nRBC: 0 % (ref 0.0–0.2)

## 2019-06-20 LAB — HEMOGLOBIN A1C
Hgb A1c MFr Bld: 6.1 % — ABNORMAL HIGH (ref 4.8–5.6)
Mean Plasma Glucose: 128.37 mg/dL

## 2019-06-20 LAB — SARS CORONAVIRUS 2 BY RT PCR (HOSPITAL ORDER, PERFORMED IN ~~LOC~~ HOSPITAL LAB): SARS Coronavirus 2: NEGATIVE

## 2019-06-20 MED ORDER — ENOXAPARIN SODIUM 40 MG/0.4ML ~~LOC~~ SOLN: 40.0000 mg | SUBCUTANEOUS | Status: DC

## 2019-06-20 MED ORDER — IOHEXOL 300 MG/ML  SOLN
50.0000 mL | Freq: Once | INTRAMUSCULAR | Status: AC | PRN
  Administered 2019-06-20: 20 mL via ORAL

## 2019-06-20 MED ORDER — JEVITY 1.2 CAL PO LIQD: 1000.0000 mL | ORAL | Status: DC

## 2019-06-20 MED ORDER — PRO-STAT SUGAR FREE PO LIQD
30.0000 mL | Freq: Every day | ORAL | Status: DC
  Administered 2019-06-21 – 2019-06-28 (×8): 30 mL
  Filled 2019-06-20 (×8): qty 30

## 2019-06-20 MED ORDER — METHOCARBAMOL 1000 MG/10ML IJ SOLN
500.0000 mg | Freq: Four times a day (QID) | INTRAVENOUS | Status: DC | PRN
  Administered 2019-06-21 (×2): 500 mg via INTRAVENOUS
  Filled 2019-06-20 (×4): qty 5

## 2019-06-20 MED ORDER — JEVITY 1.2 CAL PO LIQD
1000.0000 mL | ORAL | Status: DC
  Administered 2019-06-20 – 2019-06-28 (×9): 1000 mL
  Filled 2019-06-20 (×16): qty 1000

## 2019-06-20 MED ORDER — GLUCERNA 1.2 CAL PO LIQD
1000.0000 mL | ORAL | Status: DC
  Administered 2019-06-20: 1000 mL
  Filled 2019-06-20 (×2): qty 1000

## 2019-06-20 MED ORDER — LIDOCAINE VISCOUS HCL 2 % MT SOLN
OROMUCOSAL | Status: AC
  Administered 2019-06-20: 4 mL via OROMUCOSAL
  Filled 2019-06-20: qty 15

## 2019-06-20 MED ORDER — LIDOCAINE VISCOUS HCL 2 % MT SOLN
15.0000 mL | Freq: Once | OROMUCOSAL | Status: AC
  Administered 2019-06-20: 14:00:00 4 mL via OROMUCOSAL
  Filled 2019-06-20: qty 15

## 2019-06-20 MED ORDER — INSULIN ASPART 100 UNIT/ML ~~LOC~~ SOLN
0.0000 [IU] | SUBCUTANEOUS | Status: DC
  Administered 2019-06-20: 1 [IU] via SUBCUTANEOUS
  Administered 2019-06-20: 3 [IU] via SUBCUTANEOUS
  Administered 2019-06-20: 1 [IU] via SUBCUTANEOUS
  Administered 2019-06-21: 2 [IU] via SUBCUTANEOUS
  Administered 2019-06-21: 5 [IU] via SUBCUTANEOUS
  Administered 2019-06-21 (×2): 1 [IU] via SUBCUTANEOUS
  Administered 2019-06-21: 3 [IU] via SUBCUTANEOUS
  Administered 2019-06-22: 5 [IU] via SUBCUTANEOUS
  Administered 2019-06-22 (×2): 7 [IU] via SUBCUTANEOUS

## 2019-06-20 MED ORDER — SODIUM CHLORIDE 0.9 % IV SOLN
INTRAVENOUS | Status: AC
  Administered 2019-06-20: 06:00:00 via INTRAVENOUS

## 2019-06-20 MED ORDER — DEXAMETHASONE SODIUM PHOSPHATE 4 MG/ML IJ SOLN
4.0000 mg | INTRAMUSCULAR | Status: DC
  Administered 2019-06-20: 4 mg via INTRAVENOUS
  Filled 2019-06-20: qty 1

## 2019-06-20 MED ORDER — ENOXAPARIN SODIUM 40 MG/0.4ML ~~LOC~~ SOLN
40.0000 mg | SUBCUTANEOUS | Status: DC
  Administered 2019-06-21 – 2019-06-28 (×8): 40 mg via SUBCUTANEOUS
  Filled 2019-06-20 (×10): qty 0.4

## 2019-06-20 MED ORDER — LEVOFLOXACIN IN D5W 500 MG/100ML IV SOLN
500.0000 mg | INTRAVENOUS | Status: DC
  Administered 2019-06-20 – 2019-06-23 (×4): 500 mg via INTRAVENOUS
  Filled 2019-06-20 (×4): qty 100

## 2019-06-20 MED ORDER — FENTANYL CITRATE (PF) 100 MCG/2ML IJ SOLN
50.0000 ug | Freq: Once | INTRAMUSCULAR | Status: AC
  Administered 2019-06-20: 50 ug via INTRAVENOUS
  Filled 2019-06-20: qty 2

## 2019-06-20 MED ORDER — LORAZEPAM 2 MG/ML IJ SOLN
0.5000 mg | Freq: Two times a day (BID) | INTRAMUSCULAR | Status: DC | PRN
  Administered 2019-06-20 – 2019-06-27 (×14): 0.5 mg via INTRAVENOUS
  Filled 2019-06-20 (×16): qty 1

## 2019-06-20 NOTE — Progress Notes (Addendum)
Initial Nutrition Assessment   RD working remotely.  DOCUMENTATION CODES:   Not applicable  INTERVENTION:  Initiate Jevity 1.2 formula @ 25 ml/hr via Cortrak NGT and increase by 10 ml every 4 hours to goal rate of 70 ml/hr.   Provide 30 ml Prostat once daily per tube.   Tube feeding regimen provides 2116 kcal (100% of needs), 108 grams of protein, and 1361 ml of H2O.   NUTRITION DIAGNOSIS:   Inadequate oral intake related to dysphagia as evidenced by NPO status.  GOAL:   Patient will meet greater than or equal to 90% of their needs  MONITOR:   TF tolerance, Weight trends, Labs, I & O's, Skin  REASON FOR ASSESSMENT:   Consult Enteral/tube feeding initiation and management  ASSESSMENT:   68 y.o. male with medical history significant of HIV, type 2 diabetes, hypertension, anxiety, recent C3-4 and C4-5 anterior discectomy and fusion by Dr. Vertell Novak at [facility] presents with dysphagia. Chest x-ray showing left basilar pneumonia.   Cortrak NGT has been placed in Radiology per RN. Pt with severe aspiration risk per SLP and currently NPO. Pt unable to eat or drink over the past 2 days since recent discharge. RD to order tube feeding and continue to monitor for tolerance.   Unable to complete Nutrition-Focused physical exam at this time.   Labs and medications reviewed.   Diet Order:   Diet Order            Diet NPO time specified  Diet effective now              EDUCATION NEEDS:   Not appropriate for education at this time  Skin:  Skin Assessment: Reviewed RN Assessment  Last BM:  6/30  Height:   Ht Readings from Last 1 Encounters:  06/20/19 6' (1.829 m)    Weight:   Wt Readings from Last 1 Encounters:  06/20/19 100.1 kg    Ideal Body Weight:  80.9 kg  BMI:  Body mass index is 29.93 kg/m.  Estimated Nutritional Needs:   Kcal:  2100-2300  Protein:  105-120 grams  Fluid:  2.1 - 2.3 L/day    Corrin Parker, MS, RD,  LDN Pager # [phone] After hours/ weekend pager # [phone]

## 2019-06-20 NOTE — H&P (Addendum)
History and Physical    Brian Malone YPP:[id number] DOB: 07/24/1951 DOA: 06/19/2019  PCP: Doreatha Lew, MD Patient coming from: [place]  Chief Complaint: Dysphagia  HPI: Brian Malone is a 68 y.o. male with medical history significant of HIV, type 2 diabetes, hypertension, anxiety, recent C3-4 and C4-5 anterior discectomy and fusion by Dr. Vertell Novak at [facility] orthopedics presented to [place] ED for evaluation of dysphagia.  Patient states he had neck surgery done a week ago and since then is having difficulty swallowing.  He is not able to swallow solids or liquids.  He was discharged from the hospital 2 days ago and since then has not been able to eat or drink anything as he starts to choke anytime he tries to eat or drink.  Reports having generalized weakness.  States he did not urinate for the past 2 days and finally had urine output after receiving IV fluid in the ED.  Denies any fevers, chills, chest pain, or shortness of breath.  Denies any vomiting, abdominal pain, or diarrhea.  States he has not been able to take any of his anxiety medications for the past few days due to difficulty swallowing and is feeling very anxious.  He is requesting a medication for anxiety.  States he is supposed to wear a c-collar until his next appointment with his surgeon sometime in mid July.  ED Course: Blood pressure soft on arrival, remainder vital stable.  Blood pressure improved with 2 L IV fluid boluses.  White count 14.8 with left shift.  Bicarb 19 and anion gap 15.  Blood glucose 244.  BUN 47, creatinine 2.7.  Baseline creatinine 1.2-1.4.  T bili 1.9, remainder of LFTs normal.  T bili was normal a month ago.  COVID-19 rapid test negative.  Chest x-ray showing left basilar pneumonia.  ED provider discussed the case with Dr. Haywood Pao who felt that patient symptoms are not unexpected after this type of surgery.  He recommended Decadron to help with  postsurgical swelling and inflammation.  He did not feel that the patient needed to be admitted to [facility] or see them at this time. Patient received Decadron 10 mg and IV Levaquin in the ED.  Review of Systems:  All systems reviewed and apart from history of presenting illness, are negative.  Past Medical History:  Diagnosis Date   Anxiety    Diabetes mellitus without complication ([place])    HIV (human immunodeficiency virus infection) ([place])    Hypertension    Renal disorder     Past Surgical History:  Procedure Laterality Date   ANKLE ARTHROSCOPY     APPENDECTOMY     BACK SURGERY     FUSION   THORACOTOMY Right 2008  ?   TONSILLECTOMY       reports that he has never smoked. He has never used smokeless tobacco. He reports previous alcohol use. He reports that he does not use drugs.  Allergies  Allergen Reactions   Morphine Other (See Comments)    Itching     Family History  Problem Relation Age of Onset   CAD Mother    Lung cancer Mother    CAD Father    Lung cancer Father    CAD Brother     Prior to Admission medications   Medication Sig Start Date End Date Taking? Authorizing Provider  busPIRone (BUSPAR) 10 MG tablet Take 10 mg by mouth 3 (three) times daily.  01/22/19  [provider]  Cyanocobalamin (VITAMIN B-12) 5000 MCG TBDP Take 5,000 mcg by mouth daily.     [provider]  DULoxetine (CYMBALTA) 30 MG capsule Take 30 mg by mouth every evening.  01/22/19   [provider]  gabapentin (NEURONTIN) 300 MG capsule Take 1 capsule (300 mg total) by mouth 3 (three) times daily. Patient taking differently: Take 600 mg by mouth 3 (three) times daily.  02/10/19   Carmin Muskrat, MD  glipiZIDE (GLUCOTROL) 5 MG tablet Take 5 mg by mouth 2 (two) times a day.     [provider]  HYDROcodone-acetaminophen (NORCO/VICODIN) 5-325 MG tablet Take 1 tablet by mouth 2 (two) times daily as needed for severe pain. 05/17/19   Kayleen Memos, DO  hydrOXYzine (VISTARIL) 50 MG capsule Take 50 mg by mouth daily as needed for anxiety or itching.  01/22/19   [provider]  mirabegron ER (MYRBETRIQ) 50 MG TB24 tablet Take 50 mg by mouth every evening.     [provider]  pantoprazole (PROTONIX) 40 MG tablet Take 40 mg by mouth daily.  02/12/19   [provider]  propranolol ER (INDERAL LA) 80 MG 24 hr capsule Take 80 mg by mouth daily.  02/12/19   [provider]  rizatriptan (MAXALT) 10 MG tablet Take 10 mg by mouth as needed for migraine.  03/21/19   [provider]  tamsulosin (FLOMAX) 0.4 MG CAPS capsule Take 0.4 mg by mouth daily.  02/12/19   [provider]  tiZANidine (ZANAFLEX) 4 MG tablet Take 4 mg by mouth at bedtime.  03/08/19   [provider]  topiramate (TOPAMAX) 50 MG tablet Take 50 mg by mouth 2 (two) times daily.  03/21/19   [provider]  TRIUMEQ 600-50-300 MG tablet Take 1 tablet by mouth daily.  02/12/19   [provider]  Vitamin D, Ergocalciferol, (DRISDOL) 1.25 MG (50000 UT) CAPS capsule Take 50,000 Units by mouth every 7 (seven) days.  03/21/19   [provider]    Physical Exam: Vitals:   06/19/19 2200 06/19/19 2230 06/19/19 2300 06/20/19 0018  BP: 113/75  122/73 116/71  Pulse: 82  80 72  Resp: 18  13 12   Temp:  98.2 F (36.8 C)  98 F (36.7 C)  TempSrc:  Oral  Oral  SpO2: 97%  96% 98%  Weight:      Height:        Physical Exam  Constitutional: He is oriented to person, place, and time. He appears well-developed and well-nourished. No distress.  HENT:  Head: Normocephalic.  Mouth/Throat: Oropharynx is clear and moist.  Surgical incision site appears clean, dry, and intact.  No erythema or drainage.  Eyes: Right eye exhibits no discharge. Left eye exhibits no discharge.  Neck: Neck supple.  Cardiovascular: Normal rate, regular rhythm and intact distal pulses.  Pulmonary/Chest: Effort normal and breath sounds  normal. No respiratory distress. He has no wheezes. He has no rales.  Speaking clearly in full sentences  Abdominal: Soft. Bowel sounds are normal. He exhibits no distension. There is no abdominal tenderness. There is no guarding.  Musculoskeletal:        General: No edema.  Neurological: He is alert and oriented to person, place, and time.  Skin: Skin is warm and dry. He is not diaphoretic.     Labs on Admission: I have personally reviewed following labs and imaging studies  CBC: Recent Labs  Lab 06/19/19 1632  WBC  14.8*  NEUTROABS 11.8*  HGB 13.4  HCT 40.2  MCV 102.3*  PLT 938   Basic Metabolic Panel: Recent Labs  Lab 06/19/19 1632  NA 135  K 4.8  CL 101  CO2 19*  GLUCOSE 244*  BUN 47*  CREATININE 2.78*  CALCIUM 9.2   GFR: Estimated Creatinine Clearance: 33.4 mL/min (A) (by C-G formula based on SCr of 2.78 mg/dL (H)). Liver Function Tests: Recent Labs  Lab 06/19/19 1632  AST 23  ALT 13  ALKPHOS 59  BILITOT 1.9*  PROT 7.2  ALBUMIN 4.0   No results for input(s): LIPASE, AMYLASE in the last 168 hours. No results for input(s): AMMONIA in the last 168 hours. Coagulation Profile: No results for input(s): INR, PROTIME in the last 168 hours. Cardiac Enzymes: No results for input(s): CKTOTAL, CKMB, CKMBINDEX, TROPONINI in the last 168 hours. BNP (last 3 results) No results for input(s): PROBNP in the last 8760 hours. HbA1C: No results for input(s): HGBA1C in the last 72 hours. CBG: Recent Labs  Lab 06/19/19 2226 06/20/19 0454  GLUCAP 196* 208*   Lipid Profile: No results for input(s): CHOL, HDL, LDLCALC, TRIG, CHOLHDL, LDLDIRECT in the last 72 hours. Thyroid Function Tests: No results for input(s): TSH, T4TOTAL, FREET4, T3FREE, THYROIDAB in the last 72 hours. Anemia Panel: No results for input(s): VITAMINB12, FOLATE, FERRITIN, TIBC, IRON, RETICCTPCT in the last 72 hours. Urine analysis: No results found for: COLORURINE, APPEARANCEUR, LABSPEC, PHURINE,  GLUCOSEU, HGBUR, BILIRUBINUR, KETONESUR, PROTEINUR, UROBILINOGEN, NITRITE, LEUKOCYTESUR  Radiological Exams on Admission: Dg Chest 2 View  Result Date: 06/19/2019 CLINICAL DATA:  Cough. Weakness and difficulty swallowing. Recent neck surgery. EXAM: CHEST - 2 VIEW COMPARISON:  Chest CTA 04/15/2019 and radiographs 05/09/2018 FINDINGS: The cardiomediastinal silhouette is unchanged with normal heart size. There is new left basilar airspace opacity which likely involves both the lower lobe and lingula. The right lung is clear. No pleural effusion or pneumothorax is identified. No acute osseous abnormality is seen. IMPRESSION: Left basilar pneumonia. Electronically Signed   By: Logan Bores M.D.   On: 06/19/2019 19:07    Assessment/Plan Principal Problem:   Dysphagia Active Problems:   Essential hypertension   AKI (acute kidney injury) ([place])   HCAP (healthcare-associated pneumonia)   Normal anion gap metabolic acidosis  Dysphagia Patient recently had C3-4 and C4-5 anterior discectomy and fusion at [facility].  Currently speaking clearly in full sentences and no signs of respiratory distress.  Not hypoxic.  No signs of infection at surgical incision site.  ED provider discussed the case with Dr. Haywood Pao who felt that patient symptoms are not unexpected after this type of surgery.  He recommended Decadron to help with postsurgical swelling and inflammation. -Patient received Decadron 10 mg in the ED -Keep n.p.o. -SLP eval -Aspiration precautions  AKI Likely prerenal due to dehydration. BUN 47, creatinine 2.7.  Baseline creatinine 1.2-1.4. -Continue IV fluid hydration -Continue to monitor renal function -Renal ultrasound -Monitor urine output  HCAP vs possible aspiration PNA Afebrile.  White count 14.8 with left shift. Chest x-ray showing left basilar pneumonia.  Not tachypneic or hypoxic.  COVID-19 rapid test ([place] ID) done at [facility] negative. -Continue IV Levaquin -Continuous  pulse ox -Supplemental oxygen if needed -Repeat COVID test here given low sensitivity of test done at [facility].  Droplet and contact precautions at this time.  Normal anion gap metabolic acidosis Likely related to AKI.  Bicarb 19 and anion gap 15.   -IV fluid hydration -Continue to monitor BMP  Type 2 diabetes Blood glucose 244. -Check A1c.  Sliding scale insulin sensitive every 4 hours since patient is n.p.o. and CBG checks.  Elevated T bili T bili 1.9, remainder of LFTs normal.  T bili was normal a month ago. -Right upper quadrant ultrasound  Anxiety -IV Ativan PRN as patient is currently n.p.o.  Hypertension -Hold antihypertensives as blood pressure was soft on arrival and IV fluid boluses were administered  DVT prophylaxis: Lovenox Code Status: Patient wishes to be full code. Family Communication: No family available at this time. Disposition Plan: Anticipate discharge in 1 to 2 days. Consults called: SLP Admission status: It is my clinical opinion that referral for OBSERVATION is reasonable and necessary in this patient based on the above information provided. The aforementioned taken together are felt to place the patient at high risk for further clinical deterioration. However it is anticipated that the patient may be medically stable for discharge from the hospital within 24 to 48 hours.  The medical decision making on this patient was of high complexity and the patient is at high risk for clinical deterioration, therefore this is a level 3 visit.  Shela Leff MD [facility] Pager [phone]  If 7PM-7AM, please contact night-coverage [email or web address] Password [email or web address]  06/20/2019, 5:12 AM

## 2019-06-20 NOTE — Progress Notes (Signed)
Patient admitted after midnight. Recent anterior cervical discectomy and fusion Dr Vertell Novak at [facility] admitted for dysphagia, dehydration, aki,  non-anion gap acidosis and HCAP.   PE Gen: awake alert oriented no acute distress CV: rrr no mgr no LE edema Respiratory: normal effort BS clear bilaterally no wheeze Abdomen: non-distended non-tender +BS no guarding or rebounding Neuro: hard c collar intact. Dressing anterior neck dry and intact. Moving all extremities. Speech clear   A/P 1. Dysphagia. Able to manage secretions since decedron 10mg  given yesterday. Speech clear.  -decadron 4mg  daily IV for now -await speech therapy eval -iv fluids  2. Aki. Creatinine trending down -continue vigorous IV fluids -hold nephrotoxins  -recheck in am  3. HCAP. Afebrile non-toxic appearing. COVID negative -continue levaquin  4. Acidosis: related to decreased oral intake  -iv fluids -monitor  5. Dm -ssi  6. Htn. BP low end of normal. Soft on admission. Home meds include propanolol. -holding for now -monitor  7. Elevated bili. Total bili 1.9. likely related to above.  -iv fluids -recheck tomorrow  -if no improvement consider RUQ [place]   Dyanne Carrel, NP

## 2019-06-20 NOTE — Progress Notes (Signed)
Modified Barium Swallow Progress Note  Patient Details  Name: Brian Malone MRN: [id number] Date of Birth: 10-06-1951  Today's Date: 06/20/2019  Modified Barium Swallow completed.  Full report located under Chart Review in the Imaging Section.  Brief recommendations include the following:  Clinical Impression  Pt exhibited severe pharyngeal dysphagia marked by significant pharyngeal edema impeding efficient swallow sequences and resulting in a non funtional swallow. His hyoid moves anteriorally but is unable to achieve adequate elevation and edematous tissue impedes adequate tongue base and pharyngeal contraction. Epiglottis does not invert therefore nectar barium enters laryngeal vestibule prematurely reaching cords. He performs multiple rapid sequential swallows attempting to mobilize bolus but only small amount enters UES. Barium is propelled into oral cavity due to decreased necessary pressure, reflexive cough or via volitional movement to oral cavity. Pt coughs in response to penetrates which appear to be expelled from vestibule. As edema decreases pt's prognosis for safe intake improves significantly. Per RN and pt he is receiving steriods to decrease swelling. Recommend NPO; MD contacted in xray for radiology technicial to place Cortrak. Encouraged pt to continue attempts at swallowing saliva as able. Will continue to intervene in treatment.     Swallow Evaluation Recommendations       SLP Diet Recommendations: NPO       Medication Administration: Via alternative means               Oral Care Recommendations: Oral care BID        Houston Siren 06/20/2019,5:11 PM  Orbie Pyo [place].Ed Risk analyst [phone] Office [phone]

## 2019-06-20 NOTE — Evaluation (Signed)
Clinical/Bedside Swallow Evaluation Patient Details  Name: Brian Malone MRN: [id number] Date of Birth: February 09, 1951  Today's Date: 06/20/2019 Time: SLP Start Time (ACUTE ONLY): 1054 SLP Stop Time (ACUTE ONLY): 1104 SLP Time Calculation (min) (ACUTE ONLY): 10 min  Past Medical History:  Past Medical History:  Diagnosis Date  . Anxiety   . Diabetes mellitus without complication ([place])   . HIV (human immunodeficiency virus infection) ([place])   . Hypertension   . Renal disorder    Past Surgical History:  Past Surgical History:  Procedure Laterality Date  . ANKLE ARTHROSCOPY    . APPENDECTOMY    . BACK SURGERY     FUSION  . THORACOTOMY Right 2008  ?  . TONSILLECTOMY     HPI:  Brian Malone is a 68 y.o. male with medical history significant of HIV, type 2 diabetes, hypertension, anxiety, recent C3-4 and C4-5 anterior discectomy and fusion by Dr. Vertell Novak at [facility] orthopedics presented to [place] ED for evaluation of dysphagia. Chest x-ray showing left basilar pneumonia. Per  chart MD recommended Decadron to aid in edema and inflammation.    Assessment / Plan / Recommendation Clinical Impression  Pt seen by ST while at [place] reported + s/s aspiration, was made NPO with ST planning to follow up Monday but pt was discharged Sun. His vocal quality improved per pt self report and was "unable to talk Fri". Vocal quality today is hoarse with adequate intensity but weak volitional swallow. He demonstrates effortful and multiple swallows with one ice chip and appears to subconsciously prolong laryngeal elevation compensating for suspected airway intrusion. Immediate cough after ice chip. MBS recommended and scheduled for 1315 today to fully assess function and make safest recommendations.      SLP Visit Diagnosis: Dysphagia, unspecified (R13.10)    Aspiration Risk  Severe aspiration risk    Diet Recommendation NPO        Other  Recommendations Oral  Care Recommendations: Oral care QID   Follow up Recommendations Other (comment)(TBD)      Frequency and Duration            Prognosis        Swallow Study   General HPI: Brian Malone is a 68 y.o. male with medical history significant of HIV, type 2 diabetes, hypertension, anxiety, recent C3-4 and C4-5 anterior discectomy and fusion by Dr. Vertell Novak at [facility] orthopedics presented to [place] ED for evaluation of dysphagia. Chest x-ray showing left basilar pneumonia. Per  chart MD recommended Decadron to aid in edema and inflammation.  Type of Study: Bedside Swallow Evaluation Previous Swallow Assessment: (no) Diet Prior to this Study: Thin liquids;Other (Comment)(clear liquids) Temperature Spikes Noted: No Respiratory Status: Room air History of Recent Intubation: No Behavior/Cognition: Alert;Cooperative;Pleasant mood Oral Cavity Assessment: Within Functional Limits Oral Care Completed by SLP: No Oral Cavity - Dentition: Adequate natural dentition Vision: Functional for self-feeding Self-Feeding Abilities: Able to feed self Patient Positioning: Upright in bed Baseline Vocal Quality: Hoarse Volitional Cough: Weak Volitional Swallow: Able to elicit    Oral/Motor/Sensory Function Overall Oral Motor/Sensory Function: Within functional limits   Ice Chips Ice chips: Impaired Presentation: Spoon Oral Phase Functional Implications: Prolonged oral transit Pharyngeal Phase Impairments: Cough - Immediate;Multiple swallows   Thin Liquid Thin Liquid: Not tested    Nectar Thick Nectar Thick Liquid: Not tested   Honey Thick Honey Thick Liquid: Not tested   Puree Puree: Not tested  Solid     Solid: Not tested      Brian Malone 06/20/2019,11:27 AM  Brian Malone Brian [email or web address].Ed Risk analyst [phone] Office [phone]

## 2019-06-20 NOTE — Progress Notes (Signed)
Pt refused Lovenox, reports that he's active. He also refused bed alarm to be turned on.

## 2019-06-20 NOTE — Plan of Care (Signed)
Pt arrived to the unit accompanied by 3 [facility] staff members. Pt is alert/oriented X4. Skin dry and warm. Able to communicate needs. Call bell within reach. No respiratory distress noted at this time. Will continue monitoring.

## 2019-06-20 NOTE — Progress Notes (Signed)
The provider on-call for [facility] has been paged for the arrival of this patient to the unit from [facility]. Waiting for response

## 2019-06-21 DIAGNOSIS — I10 Essential (primary) hypertension: Secondary | ICD-10-CM

## 2019-06-21 DIAGNOSIS — N179 Acute kidney failure, unspecified: Principal | ICD-10-CM

## 2019-06-21 LAB — CBC
HCT: 35.8 % — ABNORMAL LOW (ref 39.0–52.0)
Hemoglobin: 12.1 g/dL — ABNORMAL LOW (ref 13.0–17.0)
MCH: 33.8 pg (ref 26.0–34.0)
MCHC: 33.8 g/dL (ref 30.0–36.0)
MCV: 100 fL (ref 80.0–100.0)
Platelets: 166 10*3/uL (ref 150–400)
RBC: 3.58 MIL/uL — ABNORMAL LOW (ref 4.22–5.81)
RDW: 13.2 % (ref 11.5–15.5)
WBC: 11.5 10*3/uL — ABNORMAL HIGH (ref 4.0–10.5)
nRBC: 0 % (ref 0.0–0.2)

## 2019-06-21 LAB — GLUCOSE, CAPILLARY
Glucose-Capillary: 136 mg/dL — ABNORMAL HIGH (ref 70–99)
Glucose-Capillary: 149 mg/dL — ABNORMAL HIGH (ref 70–99)
Glucose-Capillary: 176 mg/dL — ABNORMAL HIGH (ref 70–99)
Glucose-Capillary: 194 mg/dL — ABNORMAL HIGH (ref 70–99)
Glucose-Capillary: 231 mg/dL — ABNORMAL HIGH (ref 70–99)
Glucose-Capillary: 275 mg/dL — ABNORMAL HIGH (ref 70–99)

## 2019-06-21 LAB — COMPREHENSIVE METABOLIC PANEL
ALT: 12 U/L (ref 0–44)
AST: 16 U/L (ref 15–41)
Albumin: 3.3 g/dL — ABNORMAL LOW (ref 3.5–5.0)
Alkaline Phosphatase: 64 U/L (ref 38–126)
Anion gap: 13 (ref 5–15)
BUN: 23 mg/dL (ref 8–23)
CO2: 20 mmol/L — ABNORMAL LOW (ref 22–32)
Calcium: 9.1 mg/dL (ref 8.9–10.3)
Chloride: 106 mmol/L (ref 98–111)
Creatinine, Ser: 1.48 mg/dL — ABNORMAL HIGH (ref 0.61–1.24)
GFR calc Af Amer: 57 mL/min — ABNORMAL LOW (ref >60)
GFR calc non Af Amer: 49 mL/min — ABNORMAL LOW (ref >60)
Glucose, Bld: 148 mg/dL — ABNORMAL HIGH (ref 70–99)
Potassium: 3.7 mmol/L (ref 3.5–5.1)
Sodium: 139 mmol/L (ref 135–145)
Total Bilirubin: 0.8 mg/dL (ref 0.3–1.2)
Total Protein: 6.6 g/dL (ref 6.5–8.1)

## 2019-06-21 MED ORDER — HYDROCOD POLST-CPM POLST ER 10-8 MG/5ML PO SUER
5.0000 mL | Freq: Once | ORAL | Status: AC
  Administered 2019-06-21: 5 mL via ORAL
  Filled 2019-06-21: qty 5

## 2019-06-21 MED ORDER — OXYCODONE HCL 5 MG PO TABS
5.0000 mg | ORAL_TABLET | ORAL | Status: DC | PRN
  Administered 2019-06-21: 5 mg via ORAL
  Administered 2019-06-22 (×2): 10 mg via ORAL
  Administered 2019-06-23: 5 mg via ORAL
  Administered 2019-06-23 – 2019-06-26 (×10): 10 mg via ORAL
  Administered 2019-06-27: 5 mg via ORAL
  Administered 2019-06-27 – 2019-06-28 (×2): 10 mg via ORAL
  Filled 2019-06-21 (×2): qty 2
  Filled 2019-06-21: qty 1
  Filled 2019-06-21 (×8): qty 2
  Filled 2019-06-21: qty 1
  Filled 2019-06-21 (×5): qty 2

## 2019-06-21 MED ORDER — SODIUM CHLORIDE 0.9 % IV SOLN
INTRAVENOUS | Status: DC
  Administered 2019-06-23 – 2019-06-27 (×6): via INTRAVENOUS

## 2019-06-21 MED ORDER — DEXAMETHASONE SODIUM PHOSPHATE 4 MG/ML IJ SOLN
4.0000 mg | Freq: Two times a day (BID) | INTRAMUSCULAR | Status: DC
  Administered 2019-06-21 – 2019-06-28 (×15): 4 mg via INTRAVENOUS
  Filled 2019-06-21 (×15): qty 1

## 2019-06-21 MED ORDER — ABACAVIR-DOLUTEGRAVIR-LAMIVUD 600-50-300 MG PO TABS
1.0000 | ORAL_TABLET | Freq: Every day | ORAL | Status: DC
  Administered 2019-06-21 – 2019-06-28 (×8): 1 via ORAL
  Filled 2019-06-21 (×9): qty 1

## 2019-06-21 MED ORDER — BENZONATATE 100 MG PO CAPS: 200.0000 mg | ORAL_CAPSULE | Freq: Three times a day (TID) | ORAL | Status: DC | PRN

## 2019-06-21 MED ORDER — HYDROCOD POLST-CPM POLST ER 10-8 MG/5ML PO SUER
5.0000 mL | Freq: Two times a day (BID) | ORAL | Status: DC | PRN
  Administered 2019-06-23 – 2019-06-24 (×2): 5 mL via ORAL
  Filled 2019-06-21 (×3): qty 5

## 2019-06-21 NOTE — Plan of Care (Signed)
Progressing towards goals

## 2019-06-21 NOTE — Progress Notes (Signed)
[facility] PROGRESS NOTE  Brian Malone QQP:[id number] DOB: 01/28/1951 DOA: 06/19/2019 PCP: Doreatha Lew, MD  Assessment/Plan:  1. Dysphagia. MBS revealed sever pharyngeal dysphagia marked by significant pharyngeal edema impeding efficient swallow sequences resulting in non functional swallow. Patient 1 week s/p anterior cervical fusion at [facility]. He is receiving IV decadron and swallowing slowly improving.  Able to manage secretions.Speech is clearer. Per ST report, swallowing should improve as edema resolves -decadron 4mg  BID IV for now -jevity tube feedings for now -NS at 50/hr per nutritional recommendation -monitor  2. Aki. Creatinine continues to trend downward.  -gentle IV fluids -hold nephrotoxins  -recheck in am  3. HCAP. Afebrile non-toxic appearing. COVID negative -continue levaquin day #3  4. Acidosis: CO2 trending up slightly.  related to decreased oral intake  -iv fluids -recheck in am  5. Dm serum glucose 148 this am.  -ssi  6. Htn. Controlled.  Home meds include propanolol. -holding for now -monitor  7. Elevated bili. Total bili within limits of normal this am.   -iv fluids -recheck tomorrow   Code Status: full Family Communication: patient Disposition Plan: home whenready   Consultants:    Procedures:  MBS 06/20/19  Antibiotics:  Levaquin  HPI/Subjective: Recent anterior cervical discectomy and fusion Dr Vertell Novak at [facility] admitted for dysphagia, dehydration, aki,  non-anion gap acidosis and HCAP. MBS revealed severe pharyngeal dysphagia. Npo and tube feedings recommended. Decadron IV for edema. levaquin for HCAP. stable  Objective: Vitals:   06/21/19 0837 06/21/19 1200  BP: 124/64 124/60  Pulse: 64 68  Resp: 18   Temp: 98.6 F (37 C)   SpO2: 99%     Intake/Output Summary (Last 24 hours) at 06/21/2019 1428 Last data filed at 06/21/2019 1100 Gross per 24 hour  Intake 619.25 ml  Output 1825 ml  Net  -1205.75 ml   Filed Weights   06/19/19 1606 06/20/19 0430  Weight: 99.8 kg 100.1 kg    Exam:   General:  Awake alert no acute distress  Cardiovascular: rrr no mgr no LE edema  Respiratory: normal effort BS clear bilaterally voice less wet sounding  Abdomen: non-distended. +BS no guarding or rebounding  Musculoskeletal: joints without swelling/erythema   Data Reviewed: Basic Metabolic Panel: Recent Labs  Lab 06/19/19 1632 06/20/19 0532 06/21/19 0411  NA 135 137 139  K 4.8 4.4 3.7  CL 101 105 106  CO2 19* 19* 20*  GLUCOSE 244* 215* 148*  BUN 47* 37* 23  CREATININE 2.78* 2.01* 1.48*  CALCIUM 9.2 9.0 9.1   Liver Function Tests: Recent Labs  Lab 06/19/19 1632 06/21/19 0411  AST 23 16  ALT 13 12  ALKPHOS 59 64  BILITOT 1.9* 0.8  PROT 7.2 6.6  ALBUMIN 4.0 3.3*   No results for input(s): LIPASE, AMYLASE in the last 168 hours. No results for input(s): AMMONIA in the last 168 hours. CBC: Recent Labs  Lab 06/19/19 1632 06/20/19 0532 06/21/19 0411  WBC 14.8* 11.0* 11.5*  NEUTROABS 11.8*  --   --   HGB 13.4 12.0* 12.1*  HCT 40.2 34.8* 35.8*  MCV 102.3* 100.9* 100.0  PLT 174 158 166   Cardiac Enzymes: No results for input(s): CKTOTAL, CKMB, CKMBINDEX, TROPONINI in the last 168 hours. BNP (last 3 results) No results for input(s): BNP in the last 8760 hours.  ProBNP (last 3 results) No results for input(s): PROBNP in the last 8760 hours.  CBG: Recent Labs  Lab 06/20/19 2029 06/20/19 2344 06/21/19 0448  06/21/19 0752 06/21/19 1156  GLUCAP 131* 136* 149* 176* 194*    Recent Results (from the past 240 hour(s))  SARS Coronavirus 2 (Hosp order,Performed in [facility] lab via [employer] ID)     Status: None   Collection Time: 06/19/19  6:04 PM   Specimen: Dry Nasal Swab (A[employer]ott ID Now)  Result Value Ref Range Status   SARS Coronavirus 2 (Abbott ID Now) NEGATIVE NEGATIVE Final    Comment: (NOTE) Interpretive Result Comment(s): COVID 19 Positive SARS CoV  2 target nucleic acids are DETECTED. The SARS CoV 2 RNA is generally detectable in upper and lower respiratory specimens during the acute phase of infection.  Positive results are indicative of active infection with SARS CoV 2.  Clinical correlation with patient history and other diagnostic information is necessary to determine patient infection status.  Positive results do not rule out bacterial infection or coinfection with other viruses. The expected result is Negative. COVID 19 Negative SARS CoV 2 target nucleic acids are NOT DETECTED. The SARS CoV 2 RNA is generally detectable in upper and lower respiratory specimens during the acute phase of infection.  Negative results do not preclude SARS CoV 2 infection, do not rule out coinfections with other pathogens, and should not be used as the sole basis for treatment or other patient management decisions.  Negative results must be combined with clinical  observations, patient history, and epidemiological information. The expected result is Negative. Invalid Presence or absence of SARS CoV 2 nucleic acids cannot be determined. Repeat testing was performed on the submitted specimen and repeated Invalid results were obtained.  If clinically indicated, additional testing on a new specimen with an alternate test methodology [phone]) is advised.  The SARS CoV 2 RNA is generally detectable in upper and lower respiratory specimens during the acute phase of infection. The expected result is Negative. Fact Sheet for Patients:  GolfingFamily.no Fact Sheet for Healthcare Providers: https://www.hernandez-brewer.com/ This test is not yet approved or cleared by the [place] FDA and has been authorized for detection and/or diagnosis of SARS CoV 2 by FDA under an Emergency Use Authorization (EUA).  This EUA will remain in effect (meaning this test can be used) for the duration of the COVID19 d eclaration  under Section 564(b)(1) of the Act, 21 U.S.C. section 216 503 4932 3(b)(1), unless the authorization is terminated or revoked sooner. Performed at [facility], [address] [place], [place] [address]   SARS Coronavirus 2 (CEPHEID - Performed in [facility] hospital lab), Hosp Order     Status: None   Collection Time: 06/20/19  5:40 AM   Specimen: Nasopharyngeal Swab  Result Value Ref Range Status   SARS Coronavirus 2 NEGATIVE NEGATIVE Final    Comment: (NOTE) If result is NEGATIVE SARS-CoV-2 target nucleic acids are NOT DETECTED. The SARS-CoV-2 RNA is generally detectable in upper and lower  respiratory specimens during the acute phase of infection. The lowest  concentration of SARS-CoV-2 viral copies this assay can detect is 250  copies / mL. A negative result does not preclude SARS-CoV-2 infection  and should not be used as the sole basis for treatment or other  patient management decisions.  A negative result may occur with  improper specimen collection / handling, submission of specimen other  than nasopharyngeal swab, presence of viral mutation(s) within the  areas targeted by this assay, and inadequate number of viral copies  (<250 copies / mL). A negative result must be combined with clinical  observations,  patient history, and epidemiological information. If result is POSITIVE SARS-CoV-2 target nucleic acids are DETECTED. The SARS-CoV-2 RNA is generally detectable in upper and lower  respiratory specimens dur ing the acute phase of infection.  Positive  results are indicative of active infection with SARS-CoV-2.  Clinical  correlation with patient history and other diagnostic information is  necessary to determine patient infection status.  Positive results do  not rule out bacterial infection or co-infection with other viruses. If result is PRESUMPTIVE POSTIVE SARS-CoV-2 nucleic acids MAY BE PRESENT.   A presumptive positive result was obtained on the submitted  specimen  and confirmed on repeat testing.  While 2019 novel coronavirus  (SARS-CoV-2) nucleic acids may be present in the submitted sample  additional confirmatory testing may be necessary for epidemiological  and / or clinical management purposes  to differentiate between  SARS-CoV-2 and other Sarbecovirus currently known to infect humans.  If clinically indicated additional testing with an alternate test  methodology [phone]) is advised. The SARS-CoV-2 RNA is generally  detectable in upper and lower respiratory sp ecimens during the acute  phase of infection. The expected result is Negative. Fact Sheet for Patients:  StrictlyIdeas.no Fact Sheet for Healthcare Providers: BankingDealers.co.za This test is not yet approved or cleared by the [place] FDA and has been authorized for detection and/or diagnosis of SARS-CoV-2 by FDA under an Emergency Use Authorization (EUA).  This EUA will remain in effect (meaning this test can be used) for the duration of the COVID-19 declaration under Section 564(b)(1) of the Act, 21 U.S.C. section 360bbb-3(b)(1), unless the authorization is terminated or revoked sooner. Performed at [facility], [place] [address] [place], [place] [address]      Studies: Dg Chest 2 View  Result Date: 06/19/2019 CLINICAL DATA:  Cough. Weakness and difficulty swallowing. Recent neck surgery. EXAM: CHEST - 2 VIEW COMPARISON:  Chest CTA 04/15/2019 and radiographs 05/09/2018 FINDINGS: The cardiomediastinal silhouette is unchanged with normal heart size. There is new left basilar airspace opacity which likely involves both the lower lobe and lingula. The right lung is clear. No pleural effusion or pneumothorax is identified. No acute osseous abnormality is seen. IMPRESSION: Left basilar pneumonia. Electronically Signed   By: Logan Bores M.D.   On: 06/19/2019 19:07   Dg Abd 1 View  Result Date: 06/20/2019 CLINICAL  DATA:  Feeding catheter placement EXAM: ABDOMEN - 1 VIEW COMPARISON:  None. FINDINGS: Feeding catheter was advanced into the stomach and subsequently into the fourth portion of the duodenum at the junction with the jejunum. This was confirmed with contrast injection. 7 minutes of fluoroscopy was utilized. IMPRESSION: Feeding catheter placement in the distal duodenum at the junction with the jejunum. Electronically Signed   By: Inez Catalina M.D.   On: 06/20/2019 15:41   Dg Swallowing Func-speech Pathology  Result Date: 06/20/2019 Objective Swallowing Evaluation: Type of Study: MBS-Modified Barium Swallow Study  Patient Details Name: Brian Malone MRN: [id number] Date of Birth: November 23, 1951 Today's Date: 06/20/2019 Time: SLP Start Time (ACUTE ONLY): 1310 -SLP Stop Time (ACUTE ONLY): 1327 SLP Time Calculation (min) (ACUTE ONLY): 17 min Past Medical History: Past Medical History: Diagnosis Date . Anxiety  . Diabetes mellitus without complication ([place])  . HIV (human immunodeficiency virus infection) ([place])  . Hypertension  . Renal disorder  Past Surgical History: Past Surgical History: Procedure Laterality Date . ANKLE ARTHROSCOPY   . APPENDECTOMY   . BACK SURGERY    FUSION . THORACOTOMY Right 2008  ? Marland Kitchen  TONSILLECTOMY   HPI: Brian Malone is a 68 y.o. male with medical history significant of HIV, type 2 diabetes, hypertension, anxiety, recent C3-4 and C4-5 anterior discectomy and fusion by Dr. Vertell Novak at [facility] orthopedics presented to [place] ED for evaluation of dysphagia. Chest x-ray showing left basilar pneumonia. Per  chart MD recommended Decadron to aid in edema and inflammation.  No data recorded Assessment / Plan / Recommendation CHL IP CLINICAL IMPRESSIONS 06/20/2019 Clinical Impression Pt exhibited severe pharyngeal dysphagia marked by significant pharyngeal edema impeding efficient swallow sequences and resulting in a non funtional swallow. His hyoid moves anteriorally but is  unable to achieve adequate elevation and edematous tissue impedes adequate tongue base and pharyngeal contraction. Epiglottis does not invert therefore nectar barium enters laryngeal vestibule prematurely reaching cords. He performs multiple rapid sequential swallows attempting to mobilize bolus but only small amount enters UES. Barium is propelled into oral cavity due to decreased necessary pressure, reflexive cough or via volitional movement to oral cavity. Pt coughs in response to penetrates which appear to be expelled from vestibule. As edema decreases pt's prognosis for safe intake improves significantly. Per RN and pt he is receiving steriods to decrease swelling. Recommend NPO; MD contacted in xray for radiology technicial to place Cortrak. Encouraged pt to continue attempts at swallowing saliva as able. Will continue to intervene in treatment.   SLP Visit Diagnosis Dysphagia, oropharyngeal phase (R13.12) Attention and concentration deficit following -- Frontal lobe and executive function deficit following -- Impact on safety and function Severe aspiration risk   CHL IP TREATMENT RECOMMENDATION 06/20/2019 Treatment Recommendations Therapy as outlined in treatment plan below   Prognosis 06/20/2019 Prognosis for Safe Diet Advancement Good Barriers to Reach Goals -- Barriers/Prognosis Comment -- CHL IP DIET RECOMMENDATION 06/20/2019 SLP Diet Recommendations NPO Liquid Administration via -- Medication Administration Via alternative means Compensations -- Postural Changes --   CHL IP OTHER RECOMMENDATIONS 06/20/2019 Recommended Consults -- Oral Care Recommendations Oral care BID Other Recommendations --   CHL IP FOLLOW UP RECOMMENDATIONS 06/20/2019 Follow up Recommendations Other (comment)   CHL IP FREQUENCY AND DURATION 06/20/2019 Speech Therapy Frequency (ACUTE ONLY) min 2x/week Treatment Duration 2 weeks      CHL IP ORAL PHASE 06/20/2019 Oral Phase Impaired Oral - Pudding Teaspoon -- Oral - Pudding Cup -- Oral - Honey  Teaspoon -- Oral - Honey Cup Delayed oral transit Oral - Nectar Teaspoon -- Oral - Nectar Cup Delayed oral transit Oral - Nectar Straw -- Oral - Thin Teaspoon -- Oral - Thin Cup Delayed oral transit Oral - Thin Straw -- Oral - Puree -- Oral - Mech Soft -- Oral - Regular -- Oral - Multi-Consistency -- Oral - Pill -- Oral Phase - Comment --  CHL IP PHARYNGEAL PHASE 06/20/2019 Pharyngeal Phase Impaired Pharyngeal- Pudding Teaspoon -- Pharyngeal -- Pharyngeal- Pudding Cup -- Pharyngeal -- Pharyngeal- Honey Teaspoon -- Pharyngeal -- Pharyngeal- Honey Cup Penetration/Aspiration during swallow;Reduced pharyngeal peristalsis;Reduced epiglottic inversion;Reduced laryngeal elevation;Reduced airway/laryngeal closure Pharyngeal Material does not enter airway Pharyngeal- Nectar Teaspoon -- Pharyngeal -- Pharyngeal- Nectar Cup Reduced pharyngeal peristalsis;Reduced epiglottic inversion;Reduced laryngeal elevation;Reduced airway/laryngeal closure;Pharyngeal residue - valleculae Pharyngeal -- Pharyngeal- Nectar Straw -- Pharyngeal -- Pharyngeal- Thin Teaspoon -- Pharyngeal -- Pharyngeal- Thin Cup Penetration/Aspiration during swallow;Reduced pharyngeal peristalsis;Reduced epiglottic inversion;Reduced laryngeal elevation;Reduced airway/laryngeal closure Pharyngeal Material enters airway, CONTACTS cords and then ejected out Pharyngeal- Thin Straw -- Pharyngeal -- Pharyngeal- Puree -- Pharyngeal -- Pharyngeal- Mechanical Soft -- Pharyngeal -- Pharyngeal- Regular -- Pharyngeal --  Pharyngeal- Multi-consistency -- Pharyngeal -- Pharyngeal- Pill -- Pharyngeal -- Pharyngeal Comment --  CHL IP CERVICAL ESOPHAGEAL PHASE 06/20/2019 Cervical Esophageal Phase WFL Pudding Teaspoon -- Pudding Cup -- Honey Teaspoon -- Honey Cup -- Nectar Teaspoon -- Nectar Cup -- Nectar Straw -- Thin Teaspoon -- Thin Cup -- Thin Straw -- Puree -- Mechanical Soft -- Regular -- Multi-consistency -- Pill -- Cervical Esophageal Comment -- [place] Siren 06/20/2019,  5:11 PM Orbie Pyo Colvin Caroli.Ed Actor Pager [phone] Office [phone]               Scheduled Meds: . abacavir-dolutegravir-lamiVUDine  1 tablet Oral Daily  . dexamethasone (DECADRON) injection  4 mg Intravenous Q12H  . enoxaparin (LOVENOX) injection  40 mg Subcutaneous Q24H  . feeding supplement (PRO-STAT SUGAR FREE 64)  30 mL Per Tube Daily  . insulin aspart  0-9 Units Subcutaneous Q4H   Continuous Infusions: . feeding supplement (JEVITY 1.2 CAL) 45 mL/hr at 06/21/19 0617  . levofloxacin (LEVAQUIN) IV 500 mg (06/20/19 1955)  . methocarbamol (ROBAXIN) IV 500 mg (06/21/19 1239)    Principal Problem:   Dysphagia Active Problems:   AKI (acute kidney injury) ([place])   HCAP (healthcare-associated pneumonia)   Normal anion gap metabolic acidosis   Diabetes ([place])   Essential hypertension    Time spent: 72 minutes    [place] NP  [facility]  If 7PM-7AM, please contact night-coverage at [email or web address], password [facility] 06/21/2019, 2:28 PM  LOS: 1 day

## 2019-06-22 DIAGNOSIS — E860 Dehydration: Secondary | ICD-10-CM

## 2019-06-22 LAB — CBC
HCT: 38 % — ABNORMAL LOW (ref 39.0–52.0)
Hemoglobin: 13.1 g/dL (ref 13.0–17.0)
MCH: 34.8 pg — ABNORMAL HIGH (ref 26.0–34.0)
MCHC: 34.5 g/dL (ref 30.0–36.0)
MCV: 101.1 fL — ABNORMAL HIGH (ref 80.0–100.0)
Platelets: 179 10*3/uL (ref 150–400)
RBC: 3.76 MIL/uL — ABNORMAL LOW (ref 4.22–5.81)
RDW: 13 % (ref 11.5–15.5)
WBC: 7.9 10*3/uL (ref 4.0–10.5)
nRBC: 0 % (ref 0.0–0.2)

## 2019-06-22 LAB — BASIC METABOLIC PANEL
Anion gap: 11 (ref 5–15)
BUN: 22 mg/dL (ref 8–23)
CO2: 19 mmol/L — ABNORMAL LOW (ref 22–32)
Calcium: 9 mg/dL (ref 8.9–10.3)
Chloride: 104 mmol/L (ref 98–111)
Creatinine, Ser: 1.25 mg/dL — ABNORMAL HIGH (ref 0.61–1.24)
GFR calc Af Amer: >60 mL/min (ref >60)
GFR calc non Af Amer: >60 mL/min (ref >60)
Glucose, Bld: 326 mg/dL — ABNORMAL HIGH (ref 70–99)
Potassium: 4.4 mmol/L (ref 3.5–5.1)
Sodium: 134 mmol/L — ABNORMAL LOW (ref 135–145)

## 2019-06-22 LAB — GLUCOSE, CAPILLARY
Glucose-Capillary: 214 mg/dL — ABNORMAL HIGH (ref 70–99)
Glucose-Capillary: 253 mg/dL — ABNORMAL HIGH (ref 70–99)
Glucose-Capillary: 258 mg/dL — ABNORMAL HIGH (ref 70–99)
Glucose-Capillary: 266 mg/dL — ABNORMAL HIGH (ref 70–99)
Glucose-Capillary: 282 mg/dL — ABNORMAL HIGH (ref 70–99)
Glucose-Capillary: 301 mg/dL — ABNORMAL HIGH (ref 70–99)
Glucose-Capillary: 307 mg/dL — ABNORMAL HIGH (ref 70–99)

## 2019-06-22 MED ORDER — BUSPIRONE HCL 10 MG PO TABS
10.0000 mg | ORAL_TABLET | Freq: Three times a day (TID) | ORAL | Status: DC
  Administered 2019-06-22 – 2019-06-25 (×10): 10 mg via ORAL
  Filled 2019-06-22 (×10): qty 1

## 2019-06-22 MED ORDER — TAMSULOSIN HCL 0.4 MG PO CAPS
0.4000 mg | ORAL_CAPSULE | Freq: Every day | ORAL | Status: DC
  Administered 2019-06-23 – 2019-06-28 (×6): 0.4 mg via ORAL
  Filled 2019-06-22 (×6): qty 1

## 2019-06-22 MED ORDER — INSULIN ASPART 100 UNIT/ML ~~LOC~~ SOLN
0.0000 [IU] | SUBCUTANEOUS | Status: DC
  Administered 2019-06-22 (×2): 8 [IU] via SUBCUTANEOUS
  Administered 2019-06-22: 5 [IU] via SUBCUTANEOUS
  Administered 2019-06-23 (×4): 8 [IU] via SUBCUTANEOUS
  Administered 2019-06-23: 3 [IU] via SUBCUTANEOUS
  Administered 2019-06-23: 5 [IU] via SUBCUTANEOUS
  Administered 2019-06-24: 8 [IU] via SUBCUTANEOUS
  Administered 2019-06-24: 5 [IU] via SUBCUTANEOUS
  Administered 2019-06-24: 11 [IU] via SUBCUTANEOUS
  Administered 2019-06-24: 5 [IU] via SUBCUTANEOUS
  Administered 2019-06-24 (×2): 8 [IU] via SUBCUTANEOUS
  Administered 2019-06-25 (×2): 5 [IU] via SUBCUTANEOUS
  Administered 2019-06-25 (×2): 8 [IU] via SUBCUTANEOUS
  Administered 2019-06-25: 3 [IU] via SUBCUTANEOUS
  Administered 2019-06-25: 5 [IU] via SUBCUTANEOUS
  Administered 2019-06-26: 8 [IU] via SUBCUTANEOUS
  Administered 2019-06-26: 5 [IU] via SUBCUTANEOUS
  Administered 2019-06-26: 11 [IU] via SUBCUTANEOUS
  Administered 2019-06-26: 2 [IU] via SUBCUTANEOUS
  Administered 2019-06-26 (×2): 5 [IU] via SUBCUTANEOUS
  Administered 2019-06-26 – 2019-06-27 (×2): 3 [IU] via SUBCUTANEOUS
  Administered 2019-06-27: 8 [IU] via SUBCUTANEOUS
  Administered 2019-06-27: 5 [IU] via SUBCUTANEOUS
  Administered 2019-06-27: 3 [IU] via SUBCUTANEOUS
  Administered 2019-06-27: 17:00:00 5 [IU] via SUBCUTANEOUS
  Administered 2019-06-28: 8 [IU] via SUBCUTANEOUS
  Administered 2019-06-28: 2 [IU] via SUBCUTANEOUS
  Administered 2019-06-28: 3 [IU] via SUBCUTANEOUS
  Administered 2019-06-28: 8 [IU] via SUBCUTANEOUS

## 2019-06-22 MED ORDER — GABAPENTIN 300 MG PO CAPS
600.0000 mg | ORAL_CAPSULE | Freq: Three times a day (TID) | ORAL | Status: DC
  Administered 2019-06-22 – 2019-06-25 (×10): 600 mg via ORAL
  Filled 2019-06-22 (×10): qty 2

## 2019-06-22 MED ORDER — VITAMIN B-12 1000 MCG PO TABS
5000.0000 ug | ORAL_TABLET | Freq: Every day | ORAL | Status: DC
  Administered 2019-06-23 – 2019-06-25 (×3): 5000 ug via ORAL
  Filled 2019-06-22 (×3): qty 5

## 2019-06-22 MED ORDER — TOPIRAMATE 25 MG PO TABS
50.0000 mg | ORAL_TABLET | Freq: Two times a day (BID) | ORAL | Status: DC
  Administered 2019-06-22 – 2019-06-25 (×7): 50 mg via ORAL
  Filled 2019-06-22 (×7): qty 2

## 2019-06-22 MED ORDER — DULOXETINE HCL 30 MG PO CPEP
30.0000 mg | ORAL_CAPSULE | Freq: Every evening | ORAL | Status: DC
  Administered 2019-06-22 – 2019-06-24 (×3): 30 mg via ORAL
  Filled 2019-06-22 (×3): qty 1

## 2019-06-22 MED ORDER — INSULIN DETEMIR 100 UNIT/ML ~~LOC~~ SOLN
8.0000 [IU] | Freq: Every day | SUBCUTANEOUS | Status: DC
  Administered 2019-06-22: 8 [IU] via SUBCUTANEOUS
  Filled 2019-06-22 (×2): qty 0.08

## 2019-06-22 MED ORDER — HYDROXYZINE HCL 50 MG PO TABS
50.0000 mg | ORAL_TABLET | Freq: Every day | ORAL | Status: DC | PRN
  Filled 2019-06-22: qty 1

## 2019-06-22 NOTE — Plan of Care (Signed)
Progressing towards goals

## 2019-06-22 NOTE — Progress Notes (Signed)
[facility] PROGRESS NOTE  Brian Malone NOM:[id number] DOB: 03-11-1951 DOA: 06/19/2019 PCP: Doreatha Lew, MD  Recent anterior cervical discectomy and fusion Dr Vertell Novak at [facility] admitted for dysphagia, dehydration, aki,  non-anion gap acidosis and HCAP. MBS revealed severe pharyngeal dysphagia. Npo and tube feedings recommended. Decadron IV for edema. levaquin for pna.   Assessment/Plan:  1. Dysphagia. MBS revealed sever pharyngeal dysphagia marked by significant pharyngeal edema impeding efficient swallow sequences resulting in non functional swallow. Patient 1 week s/p anterior cervical fusion at [facility]. He is receiving IV decadron and swallowing slowly improving.  Able to manage secretions.Speech is clearer. Per ST report, swallowing should improve as edema resolves -decadron 4mg  BID IV for now -jevity tube feedings for now -NS at 50/hr per nutritional recommendation -monitor  2. Aki. On CKD stage III  -Creatinine continues to trend downward towards baseline -gentle IV fluids -hold nephrotoxins   3. HCAP. Afebrile non-toxic appearing. COVID negative -improving with levaquin  4. Acidosis:  -improving  5. Dm -SSI -low dose lantus while on tube feeds  6. Htn. Controlled.  7. Elevated bili. Total bili within limits of normal this am.   -iv fluids -resolved   Code Status: full Family Communication: patient Disposition Plan: home when able to swallow     Procedures:  MBS 06/20/19  Antibiotics:  Levaquin  HPI/Subjective: Swallowing better  Objective: Vitals:   06/22/19 0357 06/22/19 0800  BP: (!) 115/59 (!) 143/88  Pulse: 74 (!) 59  Resp: 17 18  Temp: (!) 97.4 F (36.3 C) 97.9 F (36.6 C)  SpO2: 98% 95%    Intake/Output Summary (Last 24 hours) at 06/22/2019 1143 Last data filed at 06/22/2019 0801 Gross per 24 hour  Intake 1964.61 ml  Output 1200 ml  Net 764.61 ml   Filed Weights   06/19/19 1606 06/20/19 0430   Weight: 99.8 kg 100.1 kg    Exam:   General:  Up at sink  Cardiovascular: rrr  Respiratory: no increased work of breathing  Abdomen: cortract in place  A+Ox3  Data Reviewed: Basic Metabolic Panel: Recent Labs  Lab 06/19/19 1632 06/20/19 0532 06/21/19 0411 06/22/19 0404  NA 135 137 139 134*  K 4.8 4.4 3.7 4.4  CL 101 105 106 104  CO2 19* 19* 20* 19*  GLUCOSE 244* 215* 148* 326*  BUN 47* 37* 23 22  CREATININE 2.78* 2.01* 1.48* 1.25*  CALCIUM 9.2 9.0 9.1 9.0   Liver Function Tests: Recent Labs  Lab 06/19/19 1632 06/21/19 0411  AST 23 16  ALT 13 12  ALKPHOS 59 64  BILITOT 1.9* 0.8  PROT 7.2 6.6  ALBUMIN 4.0 3.3*   No results for input(s): LIPASE, AMYLASE in the last 168 hours. No results for input(s): AMMONIA in the last 168 hours. CBC: Recent Labs  Lab 06/19/19 1632 06/20/19 0532 06/21/19 0411 06/22/19 0404  WBC 14.8* 11.0* 11.5* 7.9  NEUTROABS 11.8*  --   --   --   HGB 13.4 12.0* 12.1* 13.1  HCT 40.2 34.8* 35.8* 38.0*  MCV 102.3* 100.9* 100.0 101.1*  PLT 174 158 166 179   Cardiac Enzymes: No results for input(s): CKTOTAL, CKMB, CKMBINDEX, TROPONINI in the last 168 hours. BNP (last 3 results) No results for input(s): BNP in the last 8760 hours.  ProBNP (last 3 results) No results for input(s): PROBNP in the last 8760 hours.  CBG: Recent Labs  Lab 06/21/19 1632 06/21/19 2023 06/22/19 0048 06/22/19 0440 06/22/19 0759  GLUCAP 231* 275*  258* 307* 301*    Recent Results (from the past 240 hour(s))  SARS Coronavirus 2 (Hosp order,Performed in [facility] lab via Abbott ID)     Status: None   Collection Time: 06/19/19  6:04 PM   Specimen: Dry Nasal Swab (Abbott ID Now)  Result Value Ref Range Status   SARS Coronavirus 2 (Abbott ID Now) NEGATIVE NEGATIVE Final    Comment: (NOTE) Interpretive Result Comment(s): COVID 19 Positive SARS CoV 2 target nucleic acids are DETECTED. The SARS CoV 2 RNA is generally detectable in upper and  lower respiratory specimens during the acute phase of infection.  Positive results are indicative of active infection with SARS CoV 2.  Clinical correlation with patient history and other diagnostic information is necessary to determine patient infection status.  Positive results do not rule out bacterial infection or coinfection with other viruses. The expected result is Negative. COVID 19 Negative SARS CoV 2 target nucleic acids are NOT DETECTED. The SARS CoV 2 RNA is generally detectable in upper and lower respiratory specimens during the acute phase of infection.  Negative results do not preclude SARS CoV 2 infection, do not rule out coinfections with other pathogens, and should not be used as the sole basis for treatment or other patient management decisions.  Negative results must be combined with clinical  observations, patient history, and epidemiological information. The expected result is Negative. Invalid Presence or absence of SARS CoV 2 nucleic acids cannot be determined. Repeat testing was performed on the submitted specimen and repeated Invalid results were obtained.  If clinically indicated, additional testing on a new specimen with an alternate test methodology [id number]) is advised.  The SARS CoV 2 RNA is generally detectable in upper and lower respiratory specimens during the acute phase of infection. The expected result is Negative. Fact Sheet for Patients:  GolfingFamily.no Fact Sheet for Healthcare Providers: https://www.hernandez-brewer.com/ This test is not yet approved or cleared by the [place] FDA and has been authorized for detection and/or diagnosis of SARS CoV 2 by FDA under an Emergency Use Authorization (EUA).  This EUA will remain in effect (meaning this test can be used) for the duration of the COVID19 d eclaration under Section 564(b)(1) of the Act, 21 U.S.C. section 9063320292 3(b)(1), unless the authorization  is terminated or revoked sooner. Performed at [facility], [address] [place], [place] [address]   SARS Coronavirus 2 (CEPHEID - Performed in [facility] hospital lab), Hosp Order     Status: None   Collection Time: 06/20/19  5:40 AM   Specimen: Nasopharyngeal Swab  Result Value Ref Range Status   SARS Coronavirus 2 NEGATIVE NEGATIVE Final    Comment: (NOTE) If result is NEGATIVE SARS-CoV-2 target nucleic acids are NOT DETECTED. The SARS-CoV-2 RNA is generally detectable in upper and lower  respiratory specimens during the acute phase of infection. The lowest  concentration of SARS-CoV-2 viral copies this assay can detect is 250  copies / mL. A negative result does not preclude SARS-CoV-2 infection  and should not be used as the sole basis for treatment or other  patient management decisions.  A negative result may occur with  improper specimen collection / handling, submission of specimen other  than nasopharyngeal swab, presence of viral mutation(s) within the  areas targeted by this assay, and inadequate number of viral copies  (<250 copies / mL). A negative result must be combined with clinical  observations, patient history, and epidemiological information. If result is  POSITIVE SARS-CoV-2 target nucleic acids are DETECTED. The SARS-CoV-2 RNA is generally detectable in upper and lower  respiratory specimens dur ing the acute phase of infection.  Positive  results are indicative of active infection with SARS-CoV-2.  Clinical  correlation with patient history and other diagnostic information is  necessary to determine patient infection status.  Positive results do  not rule out bacterial infection or co-infection with other viruses. If result is PRESUMPTIVE POSTIVE SARS-CoV-2 nucleic acids MAY BE PRESENT.   A presumptive positive result was obtained on the submitted specimen  and confirmed on repeat testing.  While 2019 novel coronavirus  (SARS-CoV-2) nucleic  acids may be present in the submitted sample  additional confirmatory testing may be necessary for epidemiological  and / or clinical management purposes  to differentiate between  SARS-CoV-2 and other Sarbecovirus currently known to infect humans.  If clinically indicated additional testing with an alternate test  methodology [phone]) is advised. The SARS-CoV-2 RNA is generally  detectable in upper and lower respiratory sp ecimens during the acute  phase of infection. The expected result is Negative. Fact Sheet for Patients:  StrictlyIdeas.no Fact Sheet for Healthcare Providers: BankingDealers.co.za This test is not yet approved or cleared by the [place] FDA and has been authorized for detection and/or diagnosis of SARS-CoV-2 by FDA under an Emergency Use Authorization (EUA).  This EUA will remain in effect (meaning this test can be used) for the duration of the COVID-19 declaration under Section 564(b)(1) of the Act, 21 U.S.C. section 360bbb-3(b)(1), unless the authorization is terminated or revoked sooner. Performed at [facility], [address], [place], [place] [address]      Studies: Dg Abd 1 View  Result Date: 06/20/2019 CLINICAL DATA:  Feeding catheter placement EXAM: ABDOMEN - 1 VIEW COMPARISON:  None. FINDINGS: Feeding catheter was advanced into the stomach and subsequently into the fourth portion of the duodenum at the junction with the jejunum. This was confirmed with contrast injection. 7 minutes of fluoroscopy was utilized. IMPRESSION: Feeding catheter placement in the distal duodenum at the junction with the jejunum. Electronically Signed   By: Inez Catalina M.D.   On: 06/20/2019 15:41   Dg Swallowing Func-speech Pathology  Result Date: 06/20/2019 Objective Swallowing Evaluation: Type of Study: MBS-Modified Barium Swallow Study  Patient Details Name: Brian Malone MRN: [id number] Date of Birth: September 29, 1951  Today's Date: 06/20/2019 Time: SLP Start Time (ACUTE ONLY): 1310 -SLP Stop Time (ACUTE ONLY): 1327 SLP Time Calculation (min) (ACUTE ONLY): 17 min Past Medical History: Past Medical History: Diagnosis Date . Anxiety  . Diabetes mellitus without complication ([place])  . HIV (human immunodeficiency virus infection) ([place])  . Hypertension  . Renal disorder  Past Surgical History: Past Surgical History: Procedure Laterality Date . ANKLE ARTHROSCOPY   . APPENDECTOMY   . BACK SURGERY    FUSION . THORACOTOMY Right 2008  ? . TONSILLECTOMY   HPI: Brian Malone is a 68 y.o. male with medical history significant of HIV, type 2 diabetes, hypertension, anxiety, recent C3-4 and C4-5 anterior discectomy and fusion by Dr. Vertell Novak at [facility] orthopedics presented to [place] ED for evaluation of dysphagia. Chest x-ray showing left basilar pneumonia. Per  chart MD recommended Decadron to aid in edema and inflammation.  No data recorded Assessment / Plan / Recommendation CHL IP CLINICAL IMPRESSIONS 06/20/2019 Clinical Impression Pt exhibited severe pharyngeal dysphagia marked by significant pharyngeal edema impeding efficient swallow sequences and resulting in a non  funtional swallow. His hyoid moves anteriorally but is unable to achieve adequate elevation and edematous tissue impedes adequate tongue base and pharyngeal contraction. Epiglottis does not invert therefore nectar barium enters laryngeal vestibule prematurely reaching cords. He performs multiple rapid sequential swallows attempting to mobilize bolus but only small amount enters UES. Barium is propelled into oral cavity due to decreased necessary pressure, reflexive cough or via volitional movement to oral cavity. Pt coughs in response to penetrates which appear to be expelled from vestibule. As edema decreases pt's prognosis for safe intake improves significantly. Per RN and pt he is receiving steriods to decrease swelling. Recommend NPO; MD contacted  in xray for radiology technicial to place Cortrak. Encouraged pt to continue attempts at swallowing saliva as able. Will continue to intervene in treatment.   SLP Visit Diagnosis Dysphagia, oropharyngeal phase (R13.12) Attention and concentration deficit following -- Frontal lobe and executive function deficit following -- Impact on safety and function Severe aspiration risk   CHL IP TREATMENT RECOMMENDATION 06/20/2019 Treatment Recommendations Therapy as outlined in treatment plan below   Prognosis 06/20/2019 Prognosis for Safe Diet Advancement Good Barriers to Reach Goals -- Barriers/Prognosis Comment -- CHL IP DIET RECOMMENDATION 06/20/2019 SLP Diet Recommendations NPO Liquid Administration via -- Medication Administration Via alternative means Compensations -- Postural Changes --   CHL IP OTHER RECOMMENDATIONS 06/20/2019 Recommended Consults -- Oral Care Recommendations Oral care BID Other Recommendations --   CHL IP FOLLOW UP RECOMMENDATIONS 06/20/2019 Follow up Recommendations Other (comment)   CHL IP FREQUENCY AND DURATION 06/20/2019 Speech Therapy Frequency (ACUTE ONLY) min 2x/week Treatment Duration 2 weeks      CHL IP ORAL PHASE 06/20/2019 Oral Phase Impaired Oral - Pudding Teaspoon -- Oral - Pudding Cup -- Oral - Honey Teaspoon -- Oral - Honey Cup Delayed oral transit Oral - Nectar Teaspoon -- Oral - Nectar Cup Delayed oral transit Oral - Nectar Straw -- Oral - Thin Teaspoon -- Oral - Thin Cup Delayed oral transit Oral - Thin Straw -- Oral - Puree -- Oral - Mech Soft -- Oral - Regular -- Oral - Multi-Consistency -- Oral - Pill -- Oral Phase - Comment --  CHL IP PHARYNGEAL PHASE 06/20/2019 Pharyngeal Phase Impaired Pharyngeal- Pudding Teaspoon -- Pharyngeal -- Pharyngeal- Pudding Cup -- Pharyngeal -- Pharyngeal- Honey Teaspoon -- Pharyngeal -- Pharyngeal- Honey Cup Penetration/Aspiration during swallow;Reduced pharyngeal peristalsis;Reduced epiglottic inversion;Reduced laryngeal elevation;Reduced airway/laryngeal closure  Pharyngeal Material does not enter airway Pharyngeal- Nectar Teaspoon -- Pharyngeal -- Pharyngeal- Nectar Cup Reduced pharyngeal peristalsis;Reduced epiglottic inversion;Reduced laryngeal elevation;Reduced airway/laryngeal closure;Pharyngeal residue - valleculae Pharyngeal -- Pharyngeal- Nectar Straw -- Pharyngeal -- Pharyngeal- Thin Teaspoon -- Pharyngeal -- Pharyngeal- Thin Cup Penetration/Aspiration during swallow;Reduced pharyngeal peristalsis;Reduced epiglottic inversion;Reduced laryngeal elevation;Reduced airway/laryngeal closure Pharyngeal Material enters airway, CONTACTS cords and then ejected out Pharyngeal- Thin Straw -- Pharyngeal -- Pharyngeal- Puree -- Pharyngeal -- Pharyngeal- Mechanical Soft -- Pharyngeal -- Pharyngeal- Regular -- Pharyngeal -- Pharyngeal- Multi-consistency -- Pharyngeal -- Pharyngeal- Pill -- Pharyngeal -- Pharyngeal Comment --  CHL IP CERVICAL ESOPHAGEAL PHASE 06/20/2019 Cervical Esophageal Phase WFL Pudding Teaspoon -- Pudding Cup -- Honey Teaspoon -- Honey Cup -- Nectar Teaspoon -- Nectar Cup -- Nectar Straw -- Thin Teaspoon -- Thin Cup -- Thin Straw -- Puree -- Mechanical Soft -- Regular -- Multi-consistency -- Pill -- Cervical Esophageal Comment -- [place] Siren 06/20/2019, 5:11 PM Orbie Pyo Litaker M.Ed Actor Pager [phone] Office [phone]               Scheduled Meds: .  abacavir-dolutegravir-lamiVUDine  1 tablet Oral Daily  . dexamethasone (DECADRON) injection  4 mg Intravenous Q12H  . enoxaparin (LOVENOX) injection  40 mg Subcutaneous Q24H  . feeding supplement (PRO-STAT SUGAR FREE 64)  30 mL Per Tube Daily  . insulin aspart  0-9 Units Subcutaneous Q4H   Continuous Infusions: . sodium chloride 50 mL/hr at 06/21/19 1617  . feeding supplement (JEVITY 1.2 CAL) 1,000 mL (06/22/19 0105)  . levofloxacin (LEVAQUIN) IV 500 mg (06/21/19 2056)  . methocarbamol (ROBAXIN) IV 500 mg (06/21/19 1239)    Principal Problem:    Dysphagia Active Problems:   Diabetes ([place])   Essential hypertension   AKI (acute kidney injury) ([place])   HCAP (healthcare-associated pneumonia)   Normal anion gap metabolic acidosis    Time spent: 25 minutes    [facility]  If 7PM-7AM, please contact night-coverage at [email or web address], password [facility] 06/22/2019, 11:43 AM  LOS: 2 days

## 2019-06-22 NOTE — Progress Notes (Signed)
  Speech Language Pathology Treatment: Dysphagia  Patient Details Name: Brian Malone MRN: [id number] DOB: [birth year] Today's Date: 06/22/2019 Time: 9485-4627 SLP Time Calculation (min) (ACUTE ONLY): 27 min  Assessment / Plan / Recommendation Clinical Impression  Patient was talking on phone when entering his room. His speech and resonance was judged to be WNL. He feels his speech and voice have improved a lot allowing him to communicate by phone and he was unable to do so several days ago. He continues to report a globus feeling, however slightly improved. Trials of ice chips resulted in wet vocal quality, throat clearing and coughing. His cough was strong and was able to cough expectorant up to oral cavity and clear. While there has been significant improvement in his speech, voice and intelligibility, his swallow continues to be impaired. ST to continue care plan and follow up for PO readiness.   HPI HPI: Brian Malone is a 68 y.o. male with medical history significant of HIV, type 2 diabetes, hypertension, anxiety, recent C3-4 and C4-5 anterior discectomy and fusion by Dr. Vertell Novak at [facility] orthopedics presented to [place] ED for evaluation of dysphagia. Chest x-ray showing left basilar pneumonia. Per  chart MD recommended Decadron to aid in edema and inflammation.       SLP Plan  Continue with current plan of care       Recommendations  Diet recommendations: NPO                Oral Care Recommendations: Oral care QID SLP Visit Diagnosis: Dysphagia, oropharyngeal phase (R13.12) Plan: Continue with current plan of care       [place], MA, CCC-SLP 06/22/2019 12:21 PM

## 2019-06-23 LAB — GLUCOSE, CAPILLARY
Glucose-Capillary: 269 mg/dL — ABNORMAL HIGH (ref 70–99)
Glucose-Capillary: 234 mg/dL — ABNORMAL HIGH (ref 70–99)
Glucose-Capillary: 171 mg/dL — ABNORMAL HIGH (ref 70–99)
Glucose-Capillary: 290 mg/dL — ABNORMAL HIGH (ref 70–99)
Glucose-Capillary: 254 mg/dL — ABNORMAL HIGH (ref 70–99)

## 2019-06-23 MED ORDER — INSULIN DETEMIR 100 UNIT/ML ~~LOC~~ SOLN
12.0000 [IU] | Freq: Every day | SUBCUTANEOUS | Status: DC
  Administered 2019-06-23: 12 [IU] via SUBCUTANEOUS
  Filled 2019-06-23 (×2): qty 0.12

## 2019-06-23 MED ORDER — BIOTENE DRY MOUTH MT LIQD: 15.0000 mL | OROMUCOSAL | Status: DC | PRN

## 2019-06-23 NOTE — Progress Notes (Signed)
[facility] PROGRESS NOTE  Brian Malone:[id number] DOB: 1951/03/05 DOA: 06/19/2019 PCP: Doreatha Lew, MD  Recent anterior cervical discectomy and fusion Dr Vertell Novak at [facility] admitted for dysphagia, dehydration, aki,  non-anion gap acidosis and HCAP. MBS revealed severe pharyngeal dysphagia. Npo and tube feedings recommended. Decadron IV for edema. levaquin for pna.   Assessment/Plan:  1. Dysphagia. MBS revealed sever pharyngeal dysphagia marked by significant pharyngeal edema impeding efficient swallow sequences resulting in non functional swallow. Patient 1 week s/p anterior cervical fusion at [facility]. He is receiving IV decadron and swallowing slowly improving.  Able to manage secretions.Speech is clearer. Per ST report, swallowing should improve as edema resolves -decadron 4mg  BID IV for now -jevity tube feedings for now -NS at 50/hr per nutritional recommendation -monitor  2. Aki. On CKD stage III  -Creatinine continues to trend downward towards baseline -gentle IV fluids -hold nephrotoxins   3. HCAP. Afebrile non-toxic appearing. COVID negative -improving with levaquin  4. Acidosis:  -improving  5. Dm -SSI -low dose lantus while on tube feeds  6. Htn. Controlled.  7. Elevated bili. Total bili within limits of normal this am.   -iv fluids -resolved   Code Status: full Family Communication: patient Disposition Plan: home when able to swallow     Procedures:  MBS 06/20/19  Antibiotics:  Levaquin  HPI/Subjective: Feels speech and swallowing better/ "no longer have a grapefruit in my throat"  Objective: Vitals:   06/23/19 0419 06/23/19 0810  BP: 132/64 140/80  Pulse: 65 61  Resp: 19 12  Temp: 97.8 F (36.6 C) (!) 97.5 F (36.4 C)  SpO2: 96% 98%    Intake/Output Summary (Last 24 hours) at 06/23/2019 0902 Last data filed at 06/23/2019 0735 Gross per 24 hour  Intake 2915.27 ml  Output 960 ml  Net 1955.27 ml    Filed Weights   06/19/19 1606 06/20/19 0430 06/23/19 0419  Weight: 99.8 kg 100.1 kg 93.5 kg    Exam:   General:  On side of bed  Cardiovascular: rrr  Respiratory: no wheezing, no increased work of breathing  Abdomen: feeding tube in place  A+OX3  Data Reviewed: Basic Metabolic Panel: Recent Labs  Lab 06/19/19 1632 06/20/19 0532 06/21/19 0411 06/22/19 0404  NA 135 137 139 134*  K 4.8 4.4 3.7 4.4  CL 101 105 106 104  CO2 19* 19* 20* 19*  GLUCOSE 244* 215* 148* 326*  BUN 47* 37* 23 22  CREATININE 2.78* 2.01* 1.48* 1.25*  CALCIUM 9.2 9.0 9.1 9.0   Liver Function Tests: Recent Labs  Lab 06/19/19 1632 06/21/19 0411  AST 23 16  ALT 13 12  ALKPHOS 59 64  BILITOT 1.9* 0.8  PROT 7.2 6.6  ALBUMIN 4.0 3.3*   No results for input(s): LIPASE, AMYLASE in the last 168 hours. No results for input(s): AMMONIA in the last 168 hours. CBC: Recent Labs  Lab 06/19/19 1632 06/20/19 0532 06/21/19 0411 06/22/19 0404  WBC 14.8* 11.0* 11.5* 7.9  NEUTROABS 11.8*  --   --   --   HGB 13.4 12.0* 12.1* 13.1  HCT 40.2 34.8* 35.8* 38.0*  MCV 102.3* 100.9* 100.0 101.1*  PLT 174 158 166 179   Cardiac Enzymes: No results for input(s): CKTOTAL, CKMB, CKMBINDEX, TROPONINI in the last 168 hours. BNP (last 3 results) No results for input(s): BNP in the last 8760 hours.  ProBNP (last 3 results) No results for input(s): PROBNP in the last 8760 hours.  CBG: Recent Labs  Lab 06/22/19 1641 06/22/19 2028 06/22/19 2347 06/23/19 0423 06/23/19 0813  GLUCAP 282* 266* 253* 269* 234*    Recent Results (from the past 240 hour(s))  SARS Coronavirus 2 (Hosp order,Performed in [place] lab via Abbott ID)     Status: None   Collection Time: 06/19/19  6:04 PM   Specimen: Dry Nasal Swab (Abbott ID Now)  Result Value Ref Range Status   SARS Coronavirus 2 (Abbott ID Now) NEGATIVE NEGATIVE Final    Comment: (NOTE) Interpretive Result Comment(s): COVID 19 Positive SARS CoV 2 target  nucleic acids are DETECTED. The SARS CoV 2 RNA is generally detectable in upper and lower respiratory specimens during the acute phase of infection.  Positive results are indicative of active infection with SARS CoV 2.  Clinical correlation with patient history and other diagnostic information is necessary to determine patient infection status.  Positive results do not rule out bacterial infection or coinfection with other viruses. The expected result is Negative. COVID 19 Negative SARS CoV 2 target nucleic acids are NOT DETECTED. The SARS CoV 2 RNA is generally detectable in upper and lower respiratory specimens during the acute phase of infection.  Negative results do not preclude SARS CoV 2 infection, do not rule out coinfections with other pathogens, and should not be used as the sole basis for treatment or other patient management decisions.  Negative results must be combined with clinical  observations, patient history, and epidemiological information. The expected result is Negative. Invalid Presence or absence of SARS CoV 2 nucleic acids cannot be determined. Repeat testing was performed on the submitted specimen and repeated Invalid results were obtained.  If clinically indicated, additional testing on a new specimen with an alternate test methodology [phone]) is advised.  The SARS CoV 2 RNA is generally detectable in upper and lower respiratory specimens during the acute phase of infection. The expected result is Negative. Fact Sheet for Patients:  GolfingFamily.no Fact Sheet for Healthcare Providers: https://www.hernandez-brewer.com/ This test is not yet approved or cleared by the [place] FDA and has been authorized for detection and/or diagnosis of SARS CoV 2 by FDA under an Emergency Use Authorization (EUA).  This EUA will remain in effect (meaning this test can be used) for the duration of the COVID19 d eclaration under  Section 564(b)(1) of the Act, 21 U.S.C. section [phone] 3(b)(1), unless the authorization is terminated or revoked sooner. Performed at [facility], [address] [place], [place] [address]   SARS Coronavirus 2 (CEPHEID - Performed in [facility] hospital lab), Hosp Order     Status: None   Collection Time: 06/20/19  5:40 AM   Specimen: Nasopharyngeal Swab  Result Value Ref Range Status   SARS Coronavirus 2 NEGATIVE NEGATIVE Final    Comment: (NOTE) If result is NEGATIVE SARS-CoV-2 target nucleic acids are NOT DETECTED. The SARS-CoV-2 RNA is generally detectable in upper and lower  respiratory specimens during the acute phase of infection. The lowest  concentration of SARS-CoV-2 viral copies this assay can detect is 250  copies / mL. A negative result does not preclude SARS-CoV-2 infection  and should not be used as the sole basis for treatment or other  patient management decisions.  A negative result may occur with  improper specimen collection / handling, submission of specimen other  than nasopharyngeal swab, presence of viral mutation(s) within the  areas targeted by this assay, and inadequate number of viral copies  (<250 copies / mL). A negative result  must be combined with clinical  observations, patient history, and epidemiological information. If result is POSITIVE SARS-CoV-2 target nucleic acids are DETECTED. The SARS-CoV-2 RNA is generally detectable in upper and lower  respiratory specimens dur ing the acute phase of infection.  Positive  results are indicative of active infection with SARS-CoV-2.  Clinical  correlation with patient history and other diagnostic information is  necessary to determine patient infection status.  Positive results do  not rule out bacterial infection or co-infection with other viruses. If result is PRESUMPTIVE POSTIVE SARS-CoV-2 nucleic acids MAY BE PRESENT.   A presumptive positive result was obtained on the submitted  specimen  and confirmed on repeat testing.  While 2019 novel coronavirus  (SARS-CoV-2) nucleic acids may be present in the submitted sample  additional confirmatory testing may be necessary for epidemiological  and / or clinical management purposes  to differentiate between  SARS-CoV-2 and other Sarbecovirus currently known to infect humans.  If clinically indicated additional testing with an alternate test  methodology [phone]) is advised. The SARS-CoV-2 RNA is generally  detectable in upper and lower respiratory sp ecimens during the acute  phase of infection. The expected result is Negative. Fact Sheet for Patients:  StrictlyIdeas.no Fact Sheet for Healthcare Providers: BankingDealers.co.za This test is not yet approved or cleared by the [place] FDA and has been authorized for detection and/or diagnosis of SARS-CoV-2 by FDA under an Emergency Use Authorization (EUA).  This EUA will remain in effect (meaning this test can be used) for the duration of the COVID-19 declaration under Section 564(b)(1) of the Act, 21 U.S.C. section 360bbb-3(b)(1), unless the authorization is terminated or revoked sooner. Performed at [facility], [place] [address], [place], [place] [address]      Studies: No results found.  Scheduled Meds: . abacavir-dolutegravir-lamiVUDine  1 tablet Oral Daily  . busPIRone  10 mg Oral TID  . dexamethasone (DECADRON) injection  4 mg Intravenous Q12H  . DULoxetine  30 mg Oral QPM  . enoxaparin (LOVENOX) injection  40 mg Subcutaneous Q24H  . feeding supplement (PRO-STAT SUGAR FREE 64)  30 mL Per Tube Daily  . gabapentin  600 mg Oral TID  . insulin aspart  0-15 Units Subcutaneous Q4H  . insulin detemir  8 Units Subcutaneous Daily  . tamsulosin  0.4 mg Oral Daily  . topiramate  50 mg Oral BID  . vitamin B-12  5,000 mcg Oral Daily   Continuous Infusions: . sodium chloride 50 mL/hr at 06/21/19 1617  .  feeding supplement (JEVITY 1.2 CAL) 1,000 mL (06/22/19 0105)  . levofloxacin (LEVAQUIN) IV 500 mg (06/22/19 2005)  . methocarbamol (ROBAXIN) IV 500 mg (06/21/19 1239)    Principal Problem:   Dysphagia Active Problems:   Diabetes ([place])   Essential hypertension   AKI (acute kidney injury) ([place])   HCAP (healthcare-associated pneumonia)   Normal anion gap metabolic acidosis    Time spent: 25 minutes    [facility]  If 7PM-7AM, please contact night-coverage at [email or web address], password [facility] 06/23/2019, 9:02 AM  LOS: 3 days

## 2019-06-24 LAB — CBC
HCT: 41.1 % (ref 39.0–52.0)
Hemoglobin: 13.9 g/dL (ref 13.0–17.0)
MCH: 34.3 pg — ABNORMAL HIGH (ref 26.0–34.0)
MCHC: 33.8 g/dL (ref 30.0–36.0)
MCV: 101.5 fL — ABNORMAL HIGH (ref 80.0–100.0)
Platelets: 185 10*3/uL (ref 150–400)
RBC: 4.05 MIL/uL — ABNORMAL LOW (ref 4.22–5.81)
RDW: 12.9 % (ref 11.5–15.5)
WBC: 11.5 10*3/uL — ABNORMAL HIGH (ref 4.0–10.5)
nRBC: 0 % (ref 0.0–0.2)

## 2019-06-24 LAB — GLUCOSE, CAPILLARY
Glucose-Capillary: 201 mg/dL — ABNORMAL HIGH (ref 70–99)
Glucose-Capillary: 228 mg/dL — ABNORMAL HIGH (ref 70–99)
Glucose-Capillary: 231 mg/dL — ABNORMAL HIGH (ref 70–99)
Glucose-Capillary: 252 mg/dL — ABNORMAL HIGH (ref 70–99)
Glucose-Capillary: 280 mg/dL — ABNORMAL HIGH (ref 70–99)
Glucose-Capillary: 293 mg/dL — ABNORMAL HIGH (ref 70–99)
Glucose-Capillary: 313 mg/dL — ABNORMAL HIGH (ref 70–99)

## 2019-06-24 LAB — BASIC METABOLIC PANEL
Anion gap: 11 (ref 5–15)
BUN: 25 mg/dL — ABNORMAL HIGH (ref 8–23)
CO2: 20 mmol/L — ABNORMAL LOW (ref 22–32)
Calcium: 9.1 mg/dL (ref 8.9–10.3)
Chloride: 103 mmol/L (ref 98–111)
Creatinine, Ser: 1.21 mg/dL (ref 0.61–1.24)
GFR calc Af Amer: >60 mL/min (ref >60)
GFR calc non Af Amer: >60 mL/min (ref >60)
Glucose, Bld: 281 mg/dL — ABNORMAL HIGH (ref 70–99)
Potassium: 4.6 mmol/L (ref 3.5–5.1)
Sodium: 134 mmol/L — ABNORMAL LOW (ref 135–145)

## 2019-06-24 MED ORDER — ALUM & MAG HYDROXIDE-SIMETH 200-200-20 MG/5ML PO SUSP
30.0000 mL | Freq: Four times a day (QID) | ORAL | Status: DC | PRN
  Administered 2019-06-24: 30 mL via ORAL
  Filled 2019-06-24: qty 30

## 2019-06-24 MED ORDER — PANTOPRAZOLE SODIUM 40 MG IV SOLR
40.0000 mg | Freq: Two times a day (BID) | INTRAVENOUS | Status: DC
  Administered 2019-06-24 – 2019-06-26 (×6): 40 mg via INTRAVENOUS
  Filled 2019-06-24 (×6): qty 40

## 2019-06-24 MED ORDER — INSULIN DETEMIR 100 UNIT/ML ~~LOC~~ SOLN
18.0000 [IU] | Freq: Every day | SUBCUTANEOUS | Status: DC
  Administered 2019-06-24 – 2019-06-28 (×5): 18 [IU] via SUBCUTANEOUS
  Filled 2019-06-24 (×5): qty 0.18

## 2019-06-24 NOTE — Progress Notes (Signed)
[facility] PROGRESS NOTE  Brian Malone PRF:[id number] DOB: Jul 10, 1951 DOA: 06/19/2019 PCP: Brian Lew, MD  Recent anterior cervical discectomy and fusion Dr Brian Malone at [facility] admitted for dysphagia, dehydration, aki,  non-anion gap acidosis and HCAP. MBS revealed severe pharyngeal dysphagia. Npo and tube feedings recommended. Decadron IV for edema. levaquin for pna.   Assessment/Plan:  1. Dysphagia. MBS revealed sever pharyngeal dysphagia marked by significant pharyngeal edema impeding efficient swallow sequences resulting in non functional swallow. Patient 1 week s/p anterior cervical fusion at [facility]. He is receiving IV decadron and swallowing slowly improving.  Able to manage secretions.Speech is clearer. Per ST report, swallowing should improve as edema resolves -decadron 4mg  BID IV for now -jevity tube feedings for now -NS at 50/hr per nutritional recommendation -monitor  2. Aki. On CKD stage III  -Creatinine continues to trend downward towards baseline -gentle IV fluids -hold nephrotoxins   3. HCAP. Afebrile non-toxic appearing. COVID negative -improving with levaquin-- x 5 days  4. Acidosis:  -improving  5. Dm -SSI -low dose lantus while on tube feeds-titrate to improved blood sugars  6. Htn. Controlled.  7. Elevated bili. -- due to dehydration -iv fluids -resolved   Code Status: full Family Communication: patient Disposition Plan: home when able to swallow     Procedures:  MBS 06/20/19  Antibiotics:  Levaquin  HPI/Subjective: No overnight events  Objective: Vitals:   06/24/19 0518 06/24/19 0744  BP: (!) 164/91 (!) 142/79  Pulse: 71 66  Resp: 18 16  Temp: 97.6 F (36.4 C) 97.6 F (36.4 C)  SpO2: 96% 98%    Intake/Output Summary (Last 24 hours) at 06/24/2019 0925 Last data filed at 06/24/2019 0500 Gross per 24 hour  Intake 1331.59 ml  Output 2000 ml  Net -668.41 ml   Filed Weights   06/19/19 1606  06/20/19 0430 06/23/19 0419  Weight: 99.8 kg 100.1 kg 93.5 kg    Exam:   General:  sleeping    Data Reviewed: Basic Metabolic Panel: Recent Labs  Lab 06/19/19 1632 06/20/19 0532 06/21/19 0411 06/22/19 0404 06/24/19 0557  NA 135 137 139 134* 134*  K 4.8 4.4 3.7 4.4 4.6  CL 101 105 106 104 103  CO2 19* 19* 20* 19* 20*  GLUCOSE 244* 215* 148* 326* 281*  BUN 47* 37* 23 22 25*  CREATININE 2.78* 2.01* 1.48* 1.25* 1.21  CALCIUM 9.2 9.0 9.1 9.0 9.1   Liver Function Tests: Recent Labs  Lab 06/19/19 1632 06/21/19 0411  AST 23 16  ALT 13 12  ALKPHOS 59 64  BILITOT 1.9* 0.8  PROT 7.2 6.6  ALBUMIN 4.0 3.3*   No results for input(s): LIPASE, AMYLASE in the last 168 hours. No results for input(s): AMMONIA in the last 168 hours. CBC: Recent Labs  Lab 06/19/19 1632 06/20/19 0532 06/21/19 0411 06/22/19 0404 06/24/19 0557  WBC 14.8* 11.0* 11.5* 7.9 11.5*  NEUTROABS 11.8*  --   --   --   --   HGB 13.4 12.0* 12.1* 13.1 13.9  HCT 40.2 34.8* 35.8* 38.0* 41.1  MCV 102.3* 100.9* 100.0 101.1* 101.5*  PLT 174 158 166 179 185   Cardiac Enzymes: No results for input(s): CKTOTAL, CKMB, CKMBINDEX, TROPONINI in the last 168 hours. BNP (last 3 results) No results for input(s): BNP in the last 8760 hours.  ProBNP (last 3 results) No results for input(s): PROBNP in the last 8760 hours.  CBG: Recent Labs  Lab 06/23/19 1630 06/23/19 2100 06/24/19 0106 06/24/19  0518 06/24/19 0810  GLUCAP 290* 254* 280* 293* 231*    Recent Results (from the past 240 hour(s))  SARS Coronavirus 2 (Hosp order,Performed in [facility] lab via [employer] ID)     Status: None   Collection Time: 06/19/19  6:04 PM   Specimen: Dry Nasal Swab (Abbott ID Now)  Result Value Ref Range Status   SARS Coronavirus 2 (Abbott ID Now) NEGATIVE NEGATIVE Final    Comment: (NOTE) Interpretive Result Comment(s): COVID 19 Positive SARS CoV 2 target nucleic acids are DETECTED. The SARS CoV 2 RNA is generally  detectable in upper and lower respiratory specimens during the acute phase of infection.  Positive results are indicative of active infection with SARS CoV 2.  Clinical correlation with patient history and other diagnostic information is necessary to determine patient infection status.  Positive results do not rule out bacterial infection or coinfection with other viruses. The expected result is Negative. COVID 19 Negative SARS CoV 2 target nucleic acids are NOT DETECTED. The SARS CoV 2 RNA is generally detectable in upper and lower respiratory specimens during the acute phase of infection.  Negative results do not preclude SARS CoV 2 infection, do not rule out coinfections with other pathogens, and should not be used as the sole basis for treatment or other patient management decisions.  Negative results must be combined with clinical  observations, patient history, and epidemiological information. The expected result is Negative. Invalid Presence or absence of SARS CoV 2 nucleic acids cannot be determined. Repeat testing was performed on the submitted specimen and repeated Invalid results were obtained.  If clinically indicated, additional testing on a new specimen with an alternate test methodology [phone]) is advised.  The SARS CoV 2 RNA is generally detectable in upper and lower respiratory specimens during the acute phase of infection. The expected result is Negative. Fact Sheet for Patients:  GolfingFamily.no Fact Sheet for Healthcare Providers: https://www.hernandez-brewer.com/ This test is not yet approved or cleared by the [place] FDA and has been authorized for detection and/or diagnosis of SARS CoV 2 by FDA under an Emergency Use Authorization (EUA).  This EUA will remain in effect (meaning this test can be used) for the duration of the COVID19 d eclaration under Section 564(b)(1) of the Act, 21 U.S.C. section 4798257866 3(b)(1),  unless the authorization is terminated or revoked sooner. Performed at [facility], [address] [place], [place] [address]   SARS Coronavirus 2 (CEPHEID - Performed in [facility] hospital lab), Hosp Order     Status: None   Collection Time: 06/20/19  5:40 AM   Specimen: Nasopharyngeal Swab  Result Value Ref Range Status   SARS Coronavirus 2 NEGATIVE NEGATIVE Final    Comment: (NOTE) If result is NEGATIVE SARS-CoV-2 target nucleic acids are NOT DETECTED. The SARS-CoV-2 RNA is generally detectable in upper and lower  respiratory specimens during the acute phase of infection. The lowest  concentration of SARS-CoV-2 viral copies this assay can detect is 250  copies / mL. A negative result does not preclude SARS-CoV-2 infection  and should not be used as the sole basis for treatment or other  patient management decisions.  A negative result may occur with  improper specimen collection / handling, submission of specimen other  than nasopharyngeal swab, presence of viral mutation(s) within the  areas targeted by this assay, and inadequate number of viral copies  (<250 copies / mL). A negative result must be combined with clinical  observations, patient  history, and epidemiological information. If result is POSITIVE SARS-CoV-2 target nucleic acids are DETECTED. The SARS-CoV-2 RNA is generally detectable in upper and lower  respiratory specimens dur ing the acute phase of infection.  Positive  results are indicative of active infection with SARS-CoV-2.  Clinical  correlation with patient history and other diagnostic information is  necessary to determine patient infection status.  Positive results do  not rule out bacterial infection or co-infection with other viruses. If result is PRESUMPTIVE POSTIVE SARS-CoV-2 nucleic acids MAY BE PRESENT.   A presumptive positive result was obtained on the submitted specimen  and confirmed on repeat testing.  While 2019 novel  coronavirus  (SARS-CoV-2) nucleic acids may be present in the submitted sample  additional confirmatory testing may be necessary for epidemiological  and / or clinical management purposes  to differentiate between  SARS-CoV-2 and other Sarbecovirus currently known to infect humans.  If clinically indicated additional testing with an alternate test  methodology [phone]) is advised. The SARS-CoV-2 RNA is generally  detectable in upper and lower respiratory sp ecimens during the acute  phase of infection. The expected result is Negative. Fact Sheet for Patients:  StrictlyIdeas.no Fact Sheet for Healthcare Providers: BankingDealers.co.za This test is not yet approved or cleared by the [place] FDA and has been authorized for detection and/or diagnosis of SARS-CoV-2 by FDA under an Emergency Use Authorization (EUA).  This EUA will remain in effect (meaning this test can be used) for the duration of the COVID-19 declaration under Section 564(b)(1) of the Act, 21 U.S.C. section 360bbb-3(b)(1), unless the authorization is terminated or revoked sooner. Performed at [facility], [place] [address] [place], [place] [address]      Studies: No results found.  Scheduled Meds: . abacavir-dolutegravir-lamiVUDine  1 tablet Oral Daily  . busPIRone  10 mg Oral TID  . dexamethasone (DECADRON) injection  4 mg Intravenous Q12H  . DULoxetine  30 mg Oral QPM  . enoxaparin (LOVENOX) injection  40 mg Subcutaneous Q24H  . feeding supplement (PRO-STAT SUGAR FREE 64)  30 mL Per Tube Daily  . gabapentin  600 mg Oral TID  . insulin aspart  0-15 Units Subcutaneous Q4H  . insulin detemir  12 Units Subcutaneous Daily  . tamsulosin  0.4 mg Oral Daily  . topiramate  50 mg Oral BID  . vitamin B-12  5,000 mcg Oral Daily   Continuous Infusions: . sodium chloride 50 mL/hr at 06/23/19 1741  . feeding supplement (JEVITY 1.2 CAL) 1,000 mL (06/24/19 0257)  .  levofloxacin (LEVAQUIN) IV 500 mg (06/23/19 1826)  . methocarbamol (ROBAXIN) IV 500 mg (06/21/19 1239)    Principal Problem:   Dysphagia Active Problems:   Diabetes ([place])   Essential hypertension   AKI (acute kidney injury) ([place])   HCAP (healthcare-associated pneumonia)   Normal anion gap metabolic acidosis    Time spent: 15 minutes    [place] [facility]  If 7PM-7AM, please contact night-coverage at [email or web address], password [email or web address] 06/24/2019, 9:25 AM  LOS: 4 days

## 2019-06-25 DIAGNOSIS — J189 Pneumonia, unspecified organism: Secondary | ICD-10-CM

## 2019-06-25 LAB — GLUCOSE, CAPILLARY
Glucose-Capillary: 276 mg/dL — ABNORMAL HIGH (ref 70–99)
Glucose-Capillary: 259 mg/dL — ABNORMAL HIGH (ref 70–99)
Glucose-Capillary: 196 mg/dL — ABNORMAL HIGH (ref 70–99)
Glucose-Capillary: 222 mg/dL — ABNORMAL HIGH (ref 70–99)
Glucose-Capillary: 219 mg/dL — ABNORMAL HIGH (ref 70–99)

## 2019-06-25 MED ORDER — DULOXETINE HCL 30 MG PO CPEP
30.0000 mg | ORAL_CAPSULE | Freq: Every evening | ORAL | Status: DC
  Administered 2019-06-25 – 2019-06-27 (×3): 30 mg via ORAL
  Filled 2019-06-25 (×3): qty 1

## 2019-06-25 MED ORDER — BUSPIRONE HCL 10 MG PO TABS
10.0000 mg | ORAL_TABLET | Freq: Three times a day (TID) | ORAL | Status: DC
  Administered 2019-06-25 – 2019-06-28 (×10): 10 mg
  Filled 2019-06-25 (×10): qty 1

## 2019-06-25 MED ORDER — VITAMIN B-12 1000 MCG PO TABS
5000.0000 ug | ORAL_TABLET | Freq: Every day | ORAL | Status: DC
  Administered 2019-06-26 – 2019-06-28 (×3): 5000 ug
  Filled 2019-06-25 (×3): qty 5

## 2019-06-25 MED ORDER — GABAPENTIN 300 MG/6ML PO SOLN
600.0000 mg | Freq: Three times a day (TID) | ORAL | Status: DC
  Administered 2019-06-25 – 2019-06-28 (×8): 600 mg
  Filled 2019-06-25 (×11): qty 12

## 2019-06-25 MED ORDER — HYDROCOD POLST-CPM POLST ER 10-8 MG/5ML PO SUER
5.0000 mL | Freq: Two times a day (BID) | ORAL | Status: DC | PRN
  Administered 2019-06-25: 5 mL
  Filled 2019-06-25: qty 5

## 2019-06-25 MED ORDER — HYDROXYZINE HCL 50 MG PO TABS
50.0000 mg | ORAL_TABLET | Freq: Every day | ORAL | Status: DC | PRN
  Filled 2019-06-25: qty 1

## 2019-06-25 MED ORDER — TOPIRAMATE 25 MG PO TABS
50.0000 mg | ORAL_TABLET | Freq: Two times a day (BID) | ORAL | Status: DC
  Administered 2019-06-25 – 2019-06-28 (×6): 50 mg
  Filled 2019-06-25 (×5): qty 2

## 2019-06-25 MED ORDER — GABAPENTIN 250 MG/5ML PO SOLN
600.0000 mg | Freq: Three times a day (TID) | ORAL | Status: DC
  Filled 2019-06-25 (×3): qty 12

## 2019-06-25 MED ORDER — INSULIN ASPART 100 UNIT/ML ~~LOC~~ SOLN
5.0000 [IU] | SUBCUTANEOUS | Status: DC
  Administered 2019-06-25 – 2019-06-26 (×7): 5 [IU] via SUBCUTANEOUS

## 2019-06-25 NOTE — Progress Notes (Signed)
Inpatient Diabetes Program Recommendations  AACE/ADA: New Consensus Statement on Inpatient Glycemic Control (2015)  Target Ranges:  Prepandial:   less than 140 mg/dL      Peak postprandial:   less than 180 mg/dL (1-2 hours)      Critically ill patients:  140 - 180 mg/dL   Results for Brian Malone, Brian Malone (MRN [id number]) as of 06/25/2019 11:29  Ref. Range 06/24/2019 01:06 06/24/2019 05:18 06/24/2019 08:10 06/24/2019 12:52 06/24/2019 17:14 06/24/2019 19:42  Glucose-Capillary Latest Ref Range: 70 - 99 mg/dL 280 (H)  8 units NOVOLOG  293 (H)  8 units NOVOLOG  231 (H)  5 units NOVOLOG  228 (H)  5 units NOVOLOG +  18 units LEVEMIR  313 (H)  11 units NOVOLOG  252 (H)  8 units NOVOLOG    Results for Brian Malone, Brian Malone (MRN [id number]) as of 06/25/2019 11:29  Ref. Range 06/24/2019 23:56 06/25/2019 03:53 06/25/2019 08:02  Glucose-Capillary Latest Ref Range: 70 - 99 mg/dL 201 (H)  5 units NOVOLOG  276 (H)  8 units NOVOLOG  259 (H)  8 units NOVOLOG +  18 units LEVEMIR     Admit Dysphagia/ AKI/ PNA  History: DM   Home DM Meds: Januvia 50 mg Daily  Current Orders: Levemir 18 units Daily       Novolog Moderate Correction Scale/ SSI (0-15 units) Q4 hours      Getting Decadron 4 mg BID.  Also getting Tube Feeds 70cc/hour.     MD- Note Levemir increased to 18 units Daily yesterday AM.  May also consider adding scheduled Novolog Tube Feed Coverage to current inpatient insulin regimen as well:  Novolog 6 units Q4 hours  HOLD if tube feeds HELD for any reason     --Will follow patient during hospitalization--  Wyn Quaker RN, MSN, CDE Diabetes Coordinator Inpatient Glycemic Control Team Team Pager: [phone] (8a-5p)

## 2019-06-25 NOTE — Progress Notes (Signed)
[facility] PROGRESS NOTE  Brian Malone VOZ:[id number] DOB: [birth year] DOA: 06/19/2019 PCP: Doreatha Lew, MD  Assessment/Plan:  1. Dysphagia. MBS revealed sever pharyngeal dysphagia marked by significant pharyngeal edema impeding efficient swallow sequences resulting in non functional swallow. Patient 1 week s/p anterior cervical fusion at [facility]. He is receiving IV decadron and swallowing continues to improve albeit slowly. Now able to manage secretions.Speech is clearer. ST to re-valuate for swallowing . -decadron 4mg  BID IV for now -jevity tube feedings for now -NS at 50/hr per nutritional recommendation -monitor  2. Aki. On CKD stage III Creatinine 1.21 this am.  -gentle IV fluids -hold nephrotoxins  -monitor  3. HCAP. Afebrile non-toxic appearing. COVID negative -improving with levaquin-- x 5 days  4. Acidosis: stable. C02 20.  -continue IV fluids -continue jevity  5. Dm -SSI -low dose lantus while on tube feeds increased to 18 units 06/24/19 -add 5 units every 4 hours as long as tube feeds on -monitor  6. Htn. Controlled.  7. Elevated bili. -- due to dehydration -iv fluids -resolved  Code Status: full Family Communication: patient  Disposition Plan: home when ready   Consultants:    Procedures:  MBS  Antibiotics:  levaquin completed 5 days  HPI/Subjective: Recent anterior cervical discectomy and fusion Dr Vertell Novak at [facility] admitted for dysphagia, dehydration, aki, non-anion gap acidosis and HCAP. MBS revealed severe pharyngeal dysphagia. Npo and tube feedings recommended. Decadron IV for edema. levaquin for HCAP. stable   Objective: Vitals:   06/25/19 0759 06/25/19 1152  BP: 133/78 (!) 132/91  Pulse: 70 92  Resp: 16 16  Temp: (!) 97.4 F (36.3 C) 97.9 F (36.6 C)  SpO2: 98% 96%    Intake/Output Summary (Last 24 hours) at 06/25/2019 1220 Last data filed at 06/25/2019 0803 Gross per 24 hour  Intake 1669.53  ml  Output 1700 ml  Net -30.47 ml   Filed Weights   06/19/19 1606 06/20/19 0430 06/23/19 0419  Weight: 99.8 kg 100.1 kg 93.5 kg    Exam:   General:  Awake alert no acute distress  Cardiovascular: rrr no mgr no LE edema  Respiratory: normal effort BS clear no wheeze  Abdomen: obese +BS no guarding or rebounding  Musculoskeletal: joints without swelling/erythema   Data Reviewed: Basic Metabolic Panel: Recent Labs  Lab 06/19/19 1632 06/20/19 0532 06/21/19 0411 06/22/19 0404 06/24/19 0557  NA 135 137 139 134* 134*  K 4.8 4.4 3.7 4.4 4.6  CL 101 105 106 104 103  CO2 19* 19* 20* 19* 20*  GLUCOSE 244* 215* 148* 326* 281*  BUN 47* 37* 23 22 25*  CREATININE 2.78* 2.01* 1.48* 1.25* 1.21  CALCIUM 9.2 9.0 9.1 9.0 9.1   Liver Function Tests: Recent Labs  Lab 06/19/19 1632 06/21/19 0411  AST 23 16  ALT 13 12  ALKPHOS 59 64  BILITOT 1.9* 0.8  PROT 7.2 6.6  ALBUMIN 4.0 3.3*   No results for input(s): LIPASE, AMYLASE in the last 168 hours. No results for input(s): AMMONIA in the last 168 hours. CBC: Recent Labs  Lab 06/19/19 1632 06/20/19 0532 06/21/19 0411 06/22/19 0404 06/24/19 0557  WBC 14.8* 11.0* 11.5* 7.9 11.5*  NEUTROABS 11.8*  --   --   --   --   HGB 13.4 12.0* 12.1* 13.1 13.9  HCT 40.2 34.8* 35.8* 38.0* 41.1  MCV 102.3* 100.9* 100.0 101.1* 101.5*  PLT 174 158 166 179 185   Cardiac Enzymes: No results for input(s): CKTOTAL, CKMB,  CKMBINDEX, TROPONINI in the last 168 hours. BNP (last 3 results) No results for input(s): BNP in the last 8760 hours.  ProBNP (last 3 results) No results for input(s): PROBNP in the last 8760 hours.  CBG: Recent Labs  Lab 06/24/19 1942 06/24/19 2356 06/25/19 0353 06/25/19 0802 06/25/19 1150  GLUCAP 252* 201* 276* 259* 196*    Recent Results (from the past 240 hour(s))  SARS Coronavirus 2 (Hosp order,Performed in [facility] lab via Abbott ID)     Status: None   Collection Time: 06/19/19  6:04 PM   Specimen:  Dry Nasal Swab (Abbott ID Now)  Result Value Ref Range Status   SARS Coronavirus 2 (Abbott ID Now) NEGATIVE NEGATIVE Final    Comment: (NOTE) Interpretive Result Comment(s): COVID 19 Positive SARS CoV 2 target nucleic acids are DETECTED. The SARS CoV 2 RNA is generally detectable in upper and lower respiratory specimens during the acute phase of infection.  Positive results are indicative of active infection with SARS CoV 2.  Clinical correlation with patient history and other diagnostic information is necessary to determine patient infection status.  Positive results do not rule out bacterial infection or coinfection with other viruses. The expected result is Negative. COVID 19 Negative SARS CoV 2 target nucleic acids are NOT DETECTED. The SARS CoV 2 RNA is generally detectable in upper and lower respiratory specimens during the acute phase of infection.  Negative results do not preclude SARS CoV 2 infection, do not rule out coinfections with other pathogens, and should not be used as the sole basis for treatment or other patient management decisions.  Negative results must be combined with clinical  observations, patient history, and epidemiological information. The expected result is Negative. Invalid Presence or absence of SARS CoV 2 nucleic acids cannot be determined. Repeat testing was performed on the submitted specimen and repeated Invalid results were obtained.  If clinically indicated, additional testing on a new specimen with an alternate test methodology [phone]) is advised.  The SARS CoV 2 RNA is generally detectable in upper and lower respiratory specimens during the acute phase of infection. The expected result is Negative. Fact Sheet for Patients:  GolfingFamily.no Fact Sheet for Healthcare Providers: https://www.hernandez-brewer.com/ This test is not yet approved or cleared by the [place] FDA and has been authorized for  detection and/or diagnosis of SARS CoV 2 by FDA under an Emergency Use Authorization (EUA).  This EUA will remain in effect (meaning this test can be used) for the duration of the COVID19 d eclaration under Section 564(b)(1) of the Act, 21 U.S.C. section [phone] 3(b)(1), unless the authorization is terminated or revoked sooner. Performed at [facility], [address] [place], [place] [address]   SARS Coronavirus 2 (CEPHEID - Performed in [facility] hospital lab), Hosp Order     Status: None   Collection Time: 06/20/19  5:40 AM   Specimen: Nasopharyngeal Swab  Result Value Ref Range Status   SARS Coronavirus 2 NEGATIVE NEGATIVE Final    Comment: (NOTE) If result is NEGATIVE SARS-CoV-2 target nucleic acids are NOT DETECTED. The SARS-CoV-2 RNA is generally detectable in upper and lower  respiratory specimens during the acute phase of infection. The lowest  concentration of SARS-CoV-2 viral copies this assay can detect is 250  copies / mL. A negative result does not preclude SARS-CoV-2 infection  and should not be used as the sole basis for treatment or other  patient management decisions.  A negative result may occur  with  improper specimen collection / handling, submission of specimen other  than nasopharyngeal swab, presence of viral mutation(s) within the  areas targeted by this assay, and inadequate number of viral copies  (<250 copies / mL). A negative result must be combined with clinical  observations, patient history, and epidemiological information. If result is POSITIVE SARS-CoV-2 target nucleic acids are DETECTED. The SARS-CoV-2 RNA is generally detectable in upper and lower  respiratory specimens dur ing the acute phase of infection.  Positive  results are indicative of active infection with SARS-CoV-2.  Clinical  correlation with patient history and other diagnostic information is  necessary to determine patient infection status.  Positive results do  not  rule out bacterial infection or co-infection with other viruses. If result is PRESUMPTIVE POSTIVE SARS-CoV-2 nucleic acids MAY BE PRESENT.   A presumptive positive result was obtained on the submitted specimen  and confirmed on repeat testing.  While 2019 novel coronavirus  (SARS-CoV-2) nucleic acids may be present in the submitted sample  additional confirmatory testing may be necessary for epidemiological  and / or clinical management purposes  to differentiate between  SARS-CoV-2 and other Sarbecovirus currently known to infect humans.  If clinically indicated additional testing with an alternate test  methodology [phone]) is advised. The SARS-CoV-2 RNA is generally  detectable in upper and lower respiratory sp ecimens during the acute  phase of infection. The expected result is Negative. Fact Sheet for Patients:  StrictlyIdeas.no Fact Sheet for Healthcare Providers: BankingDealers.co.za This test is not yet approved or cleared by the [place] FDA and has been authorized for detection and/or diagnosis of SARS-CoV-2 by FDA under an Emergency Use Authorization (EUA).  This EUA will remain in effect (meaning this test can be used) for the duration of the COVID-19 declaration under Section 564(b)(1) of the Act, 21 U.S.C. section 360bbb-3(b)(1), unless the authorization is terminated or revoked sooner. Performed at [facility], [place] [address], [place], [place] [address]      Studies: No results found.  Scheduled Meds: . abacavir-dolutegravir-lamiVUDine  1 tablet Oral Daily  . busPIRone  10 mg Oral TID  . dexamethasone (DECADRON) injection  4 mg Intravenous Q12H  . DULoxetine  30 mg Oral QPM  . enoxaparin (LOVENOX) injection  40 mg Subcutaneous Q24H  . feeding supplement (PRO-STAT SUGAR FREE 64)  30 mL Per Tube Daily  . gabapentin  600 mg Oral TID  . insulin aspart  0-15 Units Subcutaneous Q4H  . insulin aspart  5 Units  Subcutaneous Q4H  . insulin detemir  18 Units Subcutaneous Daily  . pantoprazole (PROTONIX) IV  40 mg Intravenous Q12H  . tamsulosin  0.4 mg Oral Daily  . topiramate  50 mg Oral BID  . vitamin B-12  5,000 mcg Oral Daily   Continuous Infusions: . sodium chloride 50 mL/hr at 06/25/19 0652  . feeding supplement (JEVITY 1.2 CAL) 1,000 mL (06/25/19 0858)  . methocarbamol (ROBAXIN) IV 500 mg (06/21/19 1239)    Principal Problem:   Dysphagia Active Problems:   AKI (acute kidney injury) ([place])   HCAP (healthcare-associated pneumonia)   Normal anion gap metabolic acidosis   Diabetes ([place])   Essential hypertension    Time spent: 50 minutes    [place] NP  [facility]  If 7PM-7AM, please contact night-coverage at [email or web address], password [facility] 06/25/2019, 12:20 PM  LOS: 5 days

## 2019-06-25 NOTE — Progress Notes (Signed)
  Speech Language Pathology Treatment: Dysphagia  Patient Details Name: BARTLOMIEJ JENKINSON MRN: [id number] DOB: 1951/04/24 Today's Date: 06/25/2019 Time: 1194-1740 SLP Time Calculation (min) (ACUTE ONLY): 18 min  Assessment / Plan / Recommendation Clinical Impression  Little drowsy after waking from a nap. His vocal quality is noticeably less hoarse than last week. With approximately 4 swallows and ascension back into oral cavity intermittently, pt reported most of the nectar thick water had cleared pharynx. During MBS small amounts of nectar entered UES and some required pt to expel. Thin water trial resulted in explosive cough with probable penetration. Plan to repeat MBS tomorrow to detect any improvements in function and ability to initiate po's.     HPI HPI: FOUNTAIN DERUSHA is a 68 y.o. male with medical history significant of HIV, type 2 diabetes, hypertension, anxiety, recent C3-4 and C4-5 anterior discectomy and fusion by Dr. Vertell Novak at [facility] orthopedics presented to [place] ED for evaluation of dysphagia. Chest x-ray showing left basilar pneumonia. Per  chart MD recommended Decadron to aid in edema and inflammation.       SLP Plan  MBS       Recommendations  Diet recommendations: NPO Medication Administration: Via alternative means                Oral Care Recommendations: Oral care QID Follow up Recommendations: Home health SLP SLP Visit Diagnosis: Dysphagia, oropharyngeal phase (R13.12) Plan: MBS                       [place] Siren 06/25/2019, 3:49 PM   Orbie Pyo Colvin Caroli.Ed Risk analyst [phone] Office [phone]

## 2019-06-25 NOTE — Progress Notes (Addendum)
Pt refused bed alarm at 1900, pt educated about the importance of the bed alarm. Pt verbalized an understanding, but insist that bed alarm not be activated. Pt AOx4, pt educated that if he falls he would accountable for his fall. Pt accepts accountability.

## 2019-06-26 ENCOUNTER — Inpatient Hospital Stay (HOSPITAL_COMMUNITY): Payer: Medicare Other

## 2019-06-26 LAB — GLUCOSE, CAPILLARY
Glucose-Capillary: 148 mg/dL — ABNORMAL HIGH (ref 70–99)
Glucose-Capillary: 174 mg/dL — ABNORMAL HIGH (ref 70–99)
Glucose-Capillary: 201 mg/dL — ABNORMAL HIGH (ref 70–99)
Glucose-Capillary: 205 mg/dL — ABNORMAL HIGH (ref 70–99)
Glucose-Capillary: 229 mg/dL — ABNORMAL HIGH (ref 70–99)
Glucose-Capillary: 279 mg/dL — ABNORMAL HIGH (ref 70–99)
Glucose-Capillary: 344 mg/dL — ABNORMAL HIGH (ref 70–99)

## 2019-06-26 MED ORDER — GLUCERNA SHAKE PO LIQD
237.0000 mL | Freq: Two times a day (BID) | ORAL | Status: DC
  Administered 2019-06-26 – 2019-06-28 (×4): 237 mL via ORAL

## 2019-06-26 MED ORDER — INSULIN ASPART 100 UNIT/ML ~~LOC~~ SOLN
6.0000 [IU] | SUBCUTANEOUS | Status: DC
  Administered 2019-06-26 – 2019-06-28 (×12): 6 [IU] via SUBCUTANEOUS

## 2019-06-26 MED ORDER — RESOURCE THICKENUP CLEAR PO POWD
ORAL | Status: DC | PRN
  Filled 2019-06-26: qty 125

## 2019-06-26 NOTE — Progress Notes (Signed)
[facility] PROGRESS NOTE  RAN TULLIS YTK:[id number] DOB: 1951-05-01 DOA: 06/19/2019 PCP: Doreatha Lew, MD  Assessment/Plan:  1. Dysphagia. MBS revealed severe pharyngeal dysphagia marked by significant pharyngeal edema impeding efficient swallow sequences resulting in non functional swallow. Patient 2 weeks s/p anterior cervical fusion at [facility]. He is receiving IV decadron and swallowing continues to improve albeit slowly. Now able to manage secretions.Speech is clearer. ST re-valuated for swallowing and noted difficulty with thin liquids. Recommended repeat MBS which is scheduled for today.  -decadron 4mg  BID IV for now -jevity tube feedings for now -NS at 50/hr per nutritional recommendation -monitor  2. Aki. On CKD stage III Creatinine 1.21 this am.  -gentle IV fluids -hold nephrotoxins  -monitor  3. HCAP. Afebrile non-toxic appearing. COVID negative -improving with levaquin-- x 5 days  4. Acidosis: stable. C02 20.  -continue IV fluids -continue jevity  5. Dm -SSI -low dose lantus while on tube feeds increased to 18 units 06/24/19 -increase scheduled insulin to 6 units every 4 hours as long as tube feeds on -monitor  6. Htn. Controlled.  7. Elevated bili.-- due to dehydration -iv fluids -resolved   Code Status: full Family Communication: patient Disposition Plan: home when ready   Consultants:    Procedures:  MBS x2  Antibiotics:  Levaquin completed 5 days  HPI/Subjective: Recent anterior cervical discectomy and fusion Dr Vertell Novak at [facility] admitted for dysphagia, dehydration, aki, non-anion gap acidosis and HCAP.MBS revealed severe pharyngeal dysphagia. Npo and tube feedings recommended. Decadron IV for edema. levaquin for HCAP. Scheduled for repeat MBS today.   Objective: Vitals:   06/26/19 0825 06/26/19 1125  BP: 135/90 (!) 140/91  Pulse: 66 68  Resp: 15 17  Temp: 97.8 F (36.6 C) 98.1 F (36.7 C)   SpO2: 99% 99%    Intake/Output Summary (Last 24 hours) at 06/26/2019 1224 Last data filed at 06/26/2019 5573 Gross per 24 hour  Intake 3001.98 ml  Output 2150 ml  Net 851.98 ml   Filed Weights   06/23/19 0419 06/25/19 2342 06/26/19 0439  Weight: 93.5 kg 91.1 kg 91.1 kg    Exam:   General:  Awake alert no acute distress  Cardiovascular: rrr no mgr no LE edema  Respiratory: normal effort BS clear bilaterally no wheeze  Abdomen: non-distended non-tender +BS feeding tube right nare  Musculoskeletal: joints without swelling/erythema   Data Reviewed: Basic Metabolic Panel: Recent Labs  Lab 06/19/19 1632 06/20/19 0532 06/21/19 0411 06/22/19 0404 06/24/19 0557  NA 135 137 139 134* 134*  K 4.8 4.4 3.7 4.4 4.6  CL 101 105 106 104 103  CO2 19* 19* 20* 19* 20*  GLUCOSE 244* 215* 148* 326* 281*  BUN 47* 37* 23 22 25*  CREATININE 2.78* 2.01* 1.48* 1.25* 1.21  CALCIUM 9.2 9.0 9.1 9.0 9.1   Liver Function Tests: Recent Labs  Lab 06/19/19 1632 06/21/19 0411  AST 23 16  ALT 13 12  ALKPHOS 59 64  BILITOT 1.9* 0.8  PROT 7.2 6.6  ALBUMIN 4.0 3.3*   No results for input(s): LIPASE, AMYLASE in the last 168 hours. No results for input(s): AMMONIA in the last 168 hours. CBC: Recent Labs  Lab 06/19/19 1632 06/20/19 0532 06/21/19 0411 06/22/19 0404 06/24/19 0557  WBC 14.8* 11.0* 11.5* 7.9 11.5*  NEUTROABS 11.8*  --   --   --   --   HGB 13.4 12.0* 12.1* 13.1 13.9  HCT 40.2 34.8* 35.8* 38.0* 41.1  MCV 102.3* 100.9*  100.0 101.1* 101.5*  PLT 174 158 166 179 185   Cardiac Enzymes: No results for input(s): CKTOTAL, CKMB, CKMBINDEX, TROPONINI in the last 168 hours. BNP (last 3 results) No results for input(s): BNP in the last 8760 hours.  ProBNP (last 3 results) No results for input(s): PROBNP in the last 8760 hours.  CBG: Recent Labs  Lab 06/25/19 2019 06/26/19 0018 06/26/19 0406 06/26/19 0819 06/26/19 1127  GLUCAP 219* 148* 205* 201* 229*    Recent Results  (from the past 240 hour(s))  SARS Coronavirus 2 (Hosp order,Performed in [facility] lab via Abbott ID)     Status: None   Collection Time: 06/19/19  6:04 PM   Specimen: Dry Nasal Swab (Abbott ID Now)  Result Value Ref Range Status   SARS Coronavirus 2 (Abbott ID Now) NEGATIVE NEGATIVE Final    Comment: (NOTE) Interpretive Result Comment(s): COVID 19 Positive SARS CoV 2 target nucleic acids are DETECTED. The SARS CoV 2 RNA is generally detectable in upper and lower respiratory specimens during the acute phase of infection.  Positive results are indicative of active infection with SARS CoV 2.  Clinical correlation with patient history and other diagnostic information is necessary to determine patient infection status.  Positive results do not rule out bacterial infection or coinfection with other viruses. The expected result is Negative. COVID 19 Negative SARS CoV 2 target nucleic acids are NOT DETECTED. The SARS CoV 2 RNA is generally detectable in upper and lower respiratory specimens during the acute phase of infection.  Negative results do not preclude SARS CoV 2 infection, do not rule out coinfections with other pathogens, and should not be used as the sole basis for treatment or other patient management decisions.  Negative results must be combined with clinical  observations, patient history, and epidemiological information. The expected result is Negative. Invalid Presence or absence of SARS CoV 2 nucleic acids cannot be determined. Repeat testing was performed on the submitted specimen and repeated Invalid results were obtained.  If clinically indicated, additional testing on a new specimen with an alternate test methodology [phone]) is advised.  The SARS CoV 2 RNA is generally detectable in upper and lower respiratory specimens during the acute phase of infection. The expected result is Negative. Fact Sheet for Patients:  GolfingFamily.no Fact  Sheet for Healthcare Providers: https://www.hernandez-brewer.com/ This test is not yet approved or cleared by the [place] FDA and has been authorized for detection and/or diagnosis of SARS CoV 2 by FDA under an Emergency Use Authorization (EUA).  This EUA will remain in effect (meaning this test can be used) for the duration of the COVID19 d eclaration under Section 564(b)(1) of the Act, 21 U.S.C. section [phone] 3(b)(1), unless the authorization is terminated or revoked sooner. Performed at [facility], [address] [place], [place] [address]   SARS Coronavirus 2 (CEPHEID - Performed in [facility] hospital lab), Hosp Order     Status: None   Collection Time: 06/20/19  5:40 AM   Specimen: Nasopharyngeal Swab  Result Value Ref Range Status   SARS Coronavirus 2 NEGATIVE NEGATIVE Final    Comment: (NOTE) If result is NEGATIVE SARS-CoV-2 target nucleic acids are NOT DETECTED. The SARS-CoV-2 RNA is generally detectable in upper and lower  respiratory specimens during the acute phase of infection. The lowest  concentration of SARS-CoV-2 viral copies this assay can detect is 250  copies / mL. A negative result does not preclude SARS-CoV-2 infection  and should not  be used as the sole basis for treatment or other  patient management decisions.  A negative result may occur with  improper specimen collection / handling, submission of specimen other  than nasopharyngeal swab, presence of viral mutation(s) within the  areas targeted by this assay, and inadequate number of viral copies  (<250 copies / mL). A negative result must be combined with clinical  observations, patient history, and epidemiological information. If result is POSITIVE SARS-CoV-2 target nucleic acids are DETECTED. The SARS-CoV-2 RNA is generally detectable in upper and lower  respiratory specimens dur ing the acute phase of infection.  Positive  results are indicative of active infection with  SARS-CoV-2.  Clinical  correlation with patient history and other diagnostic information is  necessary to determine patient infection status.  Positive results do  not rule out bacterial infection or co-infection with other viruses. If result is PRESUMPTIVE POSTIVE SARS-CoV-2 nucleic acids MAY BE PRESENT.   A presumptive positive result was obtained on the submitted specimen  and confirmed on repeat testing.  While 2019 novel coronavirus  (SARS-CoV-2) nucleic acids may be present in the submitted sample  additional confirmatory testing may be necessary for epidemiological  and / or clinical management purposes  to differentiate between  SARS-CoV-2 and other Sarbecovirus currently known to infect humans.  If clinically indicated additional testing with an alternate test  methodology [phone]) is advised. The SARS-CoV-2 RNA is generally  detectable in upper and lower respiratory sp ecimens during the acute  phase of infection. The expected result is Negative. Fact Sheet for Patients:  StrictlyIdeas.no Fact Sheet for Healthcare Providers: BankingDealers.co.za This test is not yet approved or cleared by the [place] FDA and has been authorized for detection and/or diagnosis of SARS-CoV-2 by FDA under an Emergency Use Authorization (EUA).  This EUA will remain in effect (meaning this test can be used) for the duration of the COVID-19 declaration under Section 564(b)(1) of the Act, 21 U.S.C. section 360bbb-3(b)(1), unless the authorization is terminated or revoked sooner. Performed at [facility], [place] [address], [place], [place] [address]      Studies: No results found.  Scheduled Meds: . abacavir-dolutegravir-lamiVUDine  1 tablet Oral Daily  . busPIRone  10 mg Per Tube TID  . dexamethasone (DECADRON) injection  4 mg Intravenous Q12H  . DULoxetine  30 mg Oral QPM  . enoxaparin (LOVENOX) injection  40 mg Subcutaneous Q24H   . feeding supplement (PRO-STAT SUGAR FREE 64)  30 mL Per Tube Daily  . gabapentin  600 mg Per Tube Q8H  . insulin aspart  0-15 Units Subcutaneous Q4H  . insulin aspart  5 Units Subcutaneous Q4H  . insulin detemir  18 Units Subcutaneous Daily  . pantoprazole (PROTONIX) IV  40 mg Intravenous Q12H  . tamsulosin  0.4 mg Oral Daily  . topiramate  50 mg Per Tube BID  . vitamin B-12  5,000 mcg Per Tube Daily   Continuous Infusions: . sodium chloride 50 mL/hr at 06/26/19 0425  . feeding supplement (JEVITY 1.2 CAL) 1,000 mL (06/26/19 0428)  . methocarbamol (ROBAXIN) IV 500 mg (06/21/19 1239)    Principal Problem:   Dysphagia Active Problems:   AKI (acute kidney injury) ([place])   HCAP (healthcare-associated pneumonia)   Normal anion gap metabolic acidosis   Diabetes ([place])   Essential hypertension    Time spent: 43 minutes    [place] NP  [facility]  If 7PM-7AM, please contact night-coverage at [email or web address], password [facility] 06/26/2019, 12:24  PM  LOS: 6 days

## 2019-06-26 NOTE — Progress Notes (Signed)
Modified Barium Swallow Progress Note  Patient Details  Name: Brian Malone MRN: [id number] Date of Birth: [birth year]  Today's Date: 06/26/2019  Modified Barium Swallow completed.  Full report located under Chart Review in the Imaging Section.  Brief recommendations include the following:  Clinical Impression  Pt demonstrates improvement in swallow function with decreased edema of posterior pharyngeal wall. He is able to swallow teaspoons of nectar thick liquids and puree with high penetration. He sustains airway closure and with multiple consecutive swallows is able to clear the bolus with mild residue. He requires alternating solids and liquids to clear residue. He is very sensitive to residue and also anxious. He is advised to gradually alternate sips of nectar and puree from floor stock and if this is tolerated to the point where pt feels he can nutritionally support himself we can consider removing NG tube tomorrow. Removal of tube will improve his function and increase his comfort. Will f/u tomorrow.    Swallow Evaluation Recommendations       SLP Diet Recommendations: Dysphagia 1 (Puree) solids;Nectar thick liquid   Liquid Administration via: Spoon;Cup   Medication Administration: Via alternative means   Supervision: Patient able to self feed   Compensations: Slow rate;Small sips/bites;Follow solids with liquid   Postural Changes: Remain semi-upright after after feeds/meals (Comment)   Oral Care Recommendations: Oral care BID       [name], MA CCC-SLP  Acute Rehabilitation Services Pager [phone] Office [phone]  Lynann Beaver 06/26/2019,2:09 PM

## 2019-06-26 NOTE — Plan of Care (Signed)
  Problem: Clinical Measurements: Goal: Ability to maintain clinical measurements within normal limits will improve Outcome: Progressing Goal: Will remain free from infection Outcome: Progressing Goal: Diagnostic test results will improve Outcome: Progressing Goal: Respiratory complications will improve Outcome: Progressing Goal: Cardiovascular complication will be avoided Outcome: Progressing   Problem: Activity: Goal: Risk for activity intolerance will decrease Outcome: Progressing   Problem: Nutrition: Goal: Adequate nutrition will be maintained Outcome: Progressing   Problem: Elimination: Goal: Will not experience complications related to bowel motility Outcome: Progressing Goal: Will not experience complications related to urinary retention Outcome: Progressing   Problem: Pain Managment: Goal: General experience of comfort will improve Outcome: Progressing   Problem: Safety: Goal: Ability to remain free from injury will improve Outcome: Progressing   Problem: Skin Integrity: Goal: Risk for impaired skin integrity will decrease Outcome: Progressing   Brian Malone, BSN, RN

## 2019-06-26 NOTE — Progress Notes (Signed)
Nutrition Follow-up  DOCUMENTATION CODES:   Not applicable  INTERVENTION:  Provide Glucerna Shake po BID (thickened to nectar thick consistency), each supplement provides 220 kcal and 10 grams of protein.  Encourage adequate PO intake.  May continue Jevity 1.2 formula via Cortrak NGT at goal rate of 70 ml/hr with 30 ml Prostat once daily to provide 2116 kcal (100% of needs), 108 grams of protein, and 1361 ml of H2O.   If po intake >/= 50% at meals, may consider removal of Cortrak NGT.   RD to continue to monitor.   NUTRITION DIAGNOSIS:   Inadequate oral intake related to dysphagia as evidenced by NPO status; ongoing  GOAL:   Patient will meet greater than or equal to 90% of their needs; met with TF  MONITOR:   PO intake, Supplement acceptance, Diet advancement, Skin, TF tolerance, Weight trends, Labs, I & O's  REASON FOR ASSESSMENT:   Consult Enteral/tube feeding initiation and management  ASSESSMENT:   68 y.o. male with medical history significant of HIV, type 2 diabetes, hypertension, anxiety, recent C3-4 and C4-5 anterior discectomy and fusion by Dr. Vertell Novak at [facility] orthopedics presents with dysphagia.  Pt underwent MBS today. Diet has been advanced to a dysphagia 1 diet with nectar thick liquids. Per SLP, if po intake adequate, may consider removal of Cortrak NGT tomorrow. RD to order nutritional supplements to aid in po intake.   Labs and medications reviewed.   Diet Order:   Diet Order            DIET - DYS 1 Fluid consistency: Nectar Thick  Diet effective now              EDUCATION NEEDS:   Not appropriate for education at this time  Skin:  Skin Assessment: Reviewed RN Assessment  Last BM:  7/6  Height:   Ht Readings from Last 1 Encounters:  06/20/19 6' (1.829 m)    Weight:   Wt Readings from Last 1 Encounters:  06/26/19 91.1 kg    Ideal Body Weight:  80.9 kg  BMI:  Body mass index is 27.24 kg/m.  Estimated  Nutritional Needs:   Kcal:  2100-2300  Protein:  105-120 grams  Fluid:  2.1 - 2.3 L/day    Corrin Parker, MS, RD, LDN Pager # [phone] After hours/ weekend pager # [phone]

## 2019-06-27 ENCOUNTER — Encounter: Payer: Medicare Other | Admitting: Podiatry

## 2019-06-27 DIAGNOSIS — E0822 Diabetes mellitus due to underlying condition with diabetic chronic kidney disease: Secondary | ICD-10-CM

## 2019-06-27 DIAGNOSIS — N183 Chronic kidney disease, stage 3 (moderate): Secondary | ICD-10-CM

## 2019-06-27 LAB — CBC
HCT: 40.4 % (ref 39.0–52.0)
Hemoglobin: 13.5 g/dL (ref 13.0–17.0)
MCH: 33.7 pg (ref 26.0–34.0)
MCHC: 33.4 g/dL (ref 30.0–36.0)
MCV: 100.7 fL — ABNORMAL HIGH (ref 80.0–100.0)
Platelets: 202 10*3/uL (ref 150–400)
RBC: 4.01 MIL/uL — ABNORMAL LOW (ref 4.22–5.81)
RDW: 12.7 % (ref 11.5–15.5)
WBC: 10.3 10*3/uL (ref 4.0–10.5)
nRBC: 0 % (ref 0.0–0.2)

## 2019-06-27 LAB — BASIC METABOLIC PANEL
Anion gap: 10 (ref 5–15)
BUN: 28 mg/dL — ABNORMAL HIGH (ref 8–23)
CO2: 22 mmol/L (ref 22–32)
Calcium: 9 mg/dL (ref 8.9–10.3)
Chloride: 101 mmol/L (ref 98–111)
Creatinine, Ser: 1.13 mg/dL (ref 0.61–1.24)
GFR calc Af Amer: >60 mL/min (ref >60)
GFR calc non Af Amer: >60 mL/min (ref >60)
Glucose, Bld: 196 mg/dL — ABNORMAL HIGH (ref 70–99)
Potassium: 4.3 mmol/L (ref 3.5–5.1)
Sodium: 133 mmol/L — ABNORMAL LOW (ref 135–145)

## 2019-06-27 LAB — GLUCOSE, CAPILLARY
Glucose-Capillary: 215 mg/dL — ABNORMAL HIGH (ref 70–99)
Glucose-Capillary: 189 mg/dL — ABNORMAL HIGH (ref 70–99)
Glucose-Capillary: 168 mg/dL — ABNORMAL HIGH (ref 70–99)
Glucose-Capillary: 228 mg/dL — ABNORMAL HIGH (ref 70–99)
Glucose-Capillary: 272 mg/dL — ABNORMAL HIGH (ref 70–99)

## 2019-06-27 MED ORDER — PANTOPRAZOLE SODIUM 40 MG PO TBEC
40.0000 mg | DELAYED_RELEASE_TABLET | Freq: Two times a day (BID) | ORAL | Status: DC
  Administered 2019-06-27 – 2019-06-28 (×3): 40 mg via ORAL
  Filled 2019-06-27 (×3): qty 1

## 2019-06-27 NOTE — Progress Notes (Signed)
PROGRESS NOTE    Brian Malone  HYI:[id number] DOB: [birth year] DOA: 06/19/2019 PCP: Doreatha Lew, MD   Brief Narrative: Brian Malone is a 68 y.o. male with a history of recent anterior cervical discectomy and fusion at [facility], hypertension, CKD stage III. He presented secondary to dysphagia requiring the initiation of tube feeds.   Assessment & Plan:   Principal Problem:   Dysphagia Active Problems:   Diabetes ([place])   Essential hypertension   AKI (acute kidney injury) ([place])   HCAP (healthcare-associated pneumonia)   Normal anion gap metabolic acidosis   Dysphagia Secondary to recent neck surgery. Started on IV steroids and Cortrak placed for tube feeds. Speech therapy is on board. MBS with severe pharyngeal dysphagia. Repeat MBS on 7/7 improved and he has been advanced to a dysphagia 1 diet but is still having dysphagia and trouble especially with pills, even when crushed. -SLP recommendations -Continue Cortrak for now until improved oral intake -Continue Decadron for now  AKI on CKD stage III Baseline creatinine of around 1.2-1.4. Creatinine peak of 2.78 and is now back to baseline. AKI resolved.  HCAP Treated with Levaquin. Completed treatment.  HIV -Continue Triumeq  Diabetes mellitus, type 2 On Januvia as an outpatient. Currently on tube feeds -Continue SSI q4 hours while on TF  Metabolic acidosis Mild and in setting of AKI. Resolved.  Essential hypertension Blood pressure generally well controlled. On propranlol as an outpatient.  Hyperbilirubinemia Thought secondary to dehydration. Currently resolved.   DVT prophylaxis: Lovenox Code Status:   Code Status: Full Code Family Communication: None Disposition Plan: Discharge likely in 48 hours if able to remove NG tube and patient tolerates PO diet well   Consultants:   None  Procedures:   NG tube  Antimicrobials:  Levaquin  Triumeq    Subjective: Dysphagia persists but is  improved. Still unable to swallow medication. States crushing the medications makes the food taste bad and he is unable to ingest the combination.  Objective: Vitals:   06/27/19 0400 06/27/19 0500 06/27/19 0742 06/27/19 1230  BP: 125/80  131/88 (!) 160/105  Pulse: 69  66 96  Resp: 16  18 18   Temp: 98 F (36.7 C)  97.6 F (36.4 C) 97.6 F (36.4 C)  TempSrc: Oral  Oral Oral  SpO2: 96%  100% 100%  Weight:  96.2 kg    Height:        Intake/Output Summary (Last 24 hours) at 06/27/2019 1413 Last data filed at 06/27/2019 0900 Gross per 24 hour  Intake 2997 ml  Output 2200 ml  Net 797 ml   Filed Weights   06/26/19 0439 06/27/19 0022 06/27/19 0500  Weight: 91.1 kg 96.2 kg 96.2 kg    Examination:  General exam: Appears calm and comfortable HEENT: NG tube present Respiratory system: Clear to auscultation. Respiratory effort normal. Cardiovascular system: S1 & S2 heard, RRR. No murmurs, rubs, gallops or clicks. Gastrointestinal system: Abdomen is nondistended, soft and nontender. No organomegaly or masses felt. Normal bowel sounds heard. Central nervous system: Alert and oriented. No focal neurological deficits. Extremities: No edema. No calf tenderness Skin: No cyanosis. No rashes Psychiatry: Judgement and insight appear normal. Mood & affect appropriate.     Data Reviewed: I have personally reviewed following labs and imaging studies  CBC: Recent Labs  Lab 06/21/19 0411 06/22/19 0404 06/24/19 0557 06/27/19 0335  WBC 11.5* 7.9 11.5* 10.3  HGB 12.1* 13.1 13.9 13.5  HCT 35.8* 38.0* 41.1 40.4  MCV 100.0 101.1*  101.5* 100.7*  PLT 166 179 185 229   Basic Metabolic Panel: Recent Labs  Lab 06/21/19 0411 06/22/19 0404 06/24/19 0557 06/27/19 0335  NA 139 134* 134* 133*  K 3.7 4.4 4.6 4.3  CL 106 104 103 101  CO2 20* 19* 20* 22  GLUCOSE 148* 326* 281* 196*  BUN 23 22 25* 28*  CREATININE 1.48* 1.25* 1.21 1.13  CALCIUM 9.1 9.0 9.1 9.0   GFR: Estimated Creatinine  Clearance: 78.4 mL/min (by C-G formula based on SCr of 1.13 mg/dL). Liver Function Tests: Recent Labs  Lab 06/21/19 0411  AST 16  ALT 12  ALKPHOS 64  BILITOT 0.8  PROT 6.6  ALBUMIN 3.3*   No results for input(s): LIPASE, AMYLASE in the last 168 hours. No results for input(s): AMMONIA in the last 168 hours. Coagulation Profile: No results for input(s): INR, PROTIME in the last 168 hours. Cardiac Enzymes: No results for input(s): CKTOTAL, CKMB, CKMBINDEX, TROPONINI in the last 168 hours. BNP (last 3 results) No results for input(s): PROBNP in the last 8760 hours. HbA1C: No results for input(s): HGBA1C in the last 72 hours. CBG: Recent Labs  Lab 06/26/19 1958 06/26/19 2302 06/27/19 0358 06/27/19 0737 06/27/19 1227  GLUCAP 344* 174* 215* 189* 168*   Lipid Profile: No results for input(s): CHOL, HDL, LDLCALC, TRIG, CHOLHDL, LDLDIRECT in the last 72 hours. Thyroid Function Tests: No results for input(s): TSH, T4TOTAL, FREET4, T3FREE, THYROIDAB in the last 72 hours. Anemia Panel: No results for input(s): VITAMINB12, FOLATE, FERRITIN, TIBC, IRON, RETICCTPCT in the last 72 hours. Sepsis Labs: No results for input(s): PROCALCITON, LATICACIDVEN in the last 168 hours.  Recent Results (from the past 240 hour(s))  SARS Coronavirus 2 (Hosp order,Performed in [facility] lab via Abbott ID)     Status: None   Collection Time: 06/19/19  6:04 PM   Specimen: Dry Nasal Swab (Abbott ID Now)  Result Value Ref Range Status   SARS Coronavirus 2 (Abbott ID Now) NEGATIVE NEGATIVE Final    Comment: (NOTE) Interpretive Result Comment(s): COVID 19 Positive SARS CoV 2 target nucleic acids are DETECTED. The SARS CoV 2 RNA is generally detectable in upper and lower respiratory specimens during the acute phase of infection.  Positive results are indicative of active infection with SARS CoV 2.  Clinical correlation with patient history and other diagnostic information is necessary to determine  patient infection status.  Positive results do not rule out bacterial infection or coinfection with other viruses. The expected result is Negative. COVID 19 Negative SARS CoV 2 target nucleic acids are NOT DETECTED. The SARS CoV 2 RNA is generally detectable in upper and lower respiratory specimens during the acute phase of infection.  Negative results do not preclude SARS CoV 2 infection, do not rule out coinfections with other pathogens, and should not be used as the sole basis for treatment or other patient management decisions.  Negative results must be combined with clinical  observations, patient history, and epidemiological information. The expected result is Negative. Invalid Presence or absence of SARS CoV 2 nucleic acids cannot be determined. Repeat testing was performed on the submitted specimen and repeated Invalid results were obtained.  If clinically indicated, additional testing on a new specimen with an alternate test methodology [phone]) is advised.  The SARS CoV 2 RNA is generally detectable in upper and lower respiratory specimens during the acute phase of infection. The expected result is Negative. Fact Sheet for Patients:  GolfingFamily.no Fact Sheet for Healthcare Providers: https://www.hernandez-brewer.com/  This test is not yet approved or cleared by the [place] and has been authorized for detection and/or diagnosis of SARS CoV 2 by FDA under an Emergency Use Authorization (EUA).  This EUA will remain in effect (meaning this test can be used) for the duration of the COVID19 d eclaration under Section 564(b)(1) of the Act, 21 U.S.C. section [phone] 3(b)(1), unless the authorization is terminated or revoked sooner. Performed at [facility], [address] [place], [place] [address]   SARS Coronavirus 2 (CEPHEID - Performed in [facility] hospital lab), Hosp Order     Status: None   Collection Time:  06/20/19  5:40 AM   Specimen: Nasopharyngeal Swab  Result Value Ref Range Status   SARS Coronavirus 2 NEGATIVE NEGATIVE Final    Comment: (NOTE) If result is NEGATIVE SARS-CoV-2 target nucleic acids are NOT DETECTED. The SARS-CoV-2 RNA is generally detectable in upper and lower  respiratory specimens during the acute phase of infection. The lowest  concentration of SARS-CoV-2 viral copies this assay can detect is 250  copies / mL. A negative result does not preclude SARS-CoV-2 infection  and should not be used as the sole basis for treatment or other  patient management decisions.  A negative result may occur with  improper specimen collection / handling, submission of specimen other  than nasopharyngeal swab, presence of viral mutation(s) within the  areas targeted by this assay, and inadequate number of viral copies  (<250 copies / mL). A negative result must be combined with clinical  observations, patient history, and epidemiological information. If result is POSITIVE SARS-CoV-2 target nucleic acids are DETECTED. The SARS-CoV-2 RNA is generally detectable in upper and lower  respiratory specimens dur ing the acute phase of infection.  Positive  results are indicative of active infection with SARS-CoV-2.  Clinical  correlation with patient history and other diagnostic information is  necessary to determine patient infection status.  Positive results do  not rule out bacterial infection or co-infection with other viruses. If result is PRESUMPTIVE POSTIVE SARS-CoV-2 nucleic acids MAY BE PRESENT.   A presumptive positive result was obtained on the submitted specimen  and confirmed on repeat testing.  While 2019 novel coronavirus  (SARS-CoV-2) nucleic acids may be present in the submitted sample  additional confirmatory testing may be necessary for epidemiological  and / or clinical management purposes  to differentiate between  SARS-CoV-2 and other Sarbecovirus currently known to  infect humans.  If clinically indicated additional testing with an alternate test  methodology [phone]) is advised. The SARS-CoV-2 RNA is generally  detectable in upper and lower respiratory sp ecimens during the acute  phase of infection. The expected result is Negative. Fact Sheet for Patients:  StrictlyIdeas.no Fact Sheet for Healthcare Providers: BankingDealers.co.za This test is not yet approved or cleared by the [place] FDA and has been authorized for detection and/or diagnosis of SARS-CoV-2 by FDA under an Emergency Use Authorization (EUA).  This EUA will remain in effect (meaning this test can be used) for the duration of the COVID-19 declaration under Section 564(b)(1) of the Act, 21 U.S.C. section 360bbb-3(b)(1), unless the authorization is terminated or revoked sooner. Performed at [facility], [place] [address] [place], [place] [address]          Radiology Studies: Dg Swallowing Func-speech Pathology  Result Date: 06/26/2019 Objective Swallowing Evaluation: Type of Study: MBS-Modified Barium Swallow Study  Patient Details Name: ANTAR MILKS MRN: [id number] Date of  Birth: 11-Dec-1951 Today's Date: 06/26/2019 Time: SLP Start Time (ACUTE ONLY): 1300 -SLP Stop Time (ACUTE ONLY): 1323 SLP Time Calculation (min) (ACUTE ONLY): 23 min Past Medical History: Past Medical History: Diagnosis Date  Anxiety   Diabetes mellitus without complication ([place])   HIV (human immunodeficiency virus infection) ([place])   Hypertension   Renal disorder  Past Surgical History: Past Surgical History: Procedure Laterality Date  ANKLE ARTHROSCOPY    APPENDECTOMY    BACK SURGERY    FUSION  THORACOTOMY Right 2008  ?  TONSILLECTOMY   HPI: RHODES CALVERT is a 68 y.o. male with medical history significant of HIV, type 2 diabetes, hypertension, anxiety, recent C3-4 and C4-5 anterior discectomy and fusion by Dr. Vertell Novak at [facility]  orthopedics presented to [place] ED for evaluation of dysphagia. Chest x-ray showing left basilar pneumonia. Per  chart MD recommended Decadron to aid in edema and inflammation.  No data recorded Assessment / Plan / Recommendation CHL IP CLINICAL IMPRESSIONS 06/26/2019 Clinical Impression Pt demonstrates improvement in swallow function with decreased edema of posterior pharyngeal wall. He is able to swallow teaspoons of nectar thick liquids and puree with high penetration. He sustains airway closure and with multiple consecutive swallows is able to clear the bolus with mild residue. He requires alternating solids and liquids to clear residue. He is very sensitive to residue and also anxious. He is advised to gradually alternate sips of nectar and puree from floor stock and if this is tolerated to the point where pt feels he can nutritionally support himself we can consider removing NG tube tomorrow. Removal of tube will improve his function and increase his comfort. Will f/u tomorrow.  SLP Visit Diagnosis Dysphagia, oropharyngeal phase (R13.12) Attention and concentration deficit following -- Frontal lobe and executive function deficit following -- Impact on safety and function Mild aspiration risk   CHL IP TREATMENT RECOMMENDATION 06/26/2019 Treatment Recommendations Therapy as outlined in treatment plan below   Prognosis 06/26/2019 Prognosis for Safe Diet Advancement Good Barriers to Reach Goals -- Barriers/Prognosis Comment -- CHL IP DIET RECOMMENDATION 06/26/2019 SLP Diet Recommendations Dysphagia 1 (Puree) solids;Nectar thick liquid Liquid Administration via Spoon;Cup Medication Administration Via alternative means Compensations Slow rate;Small sips/bites;Follow solids with liquid Postural Changes Remain semi-upright after after feeds/meals (Comment)   CHL IP OTHER RECOMMENDATIONS 06/26/2019 Recommended Consults -- Oral Care Recommendations Oral care BID Other Recommendations --   CHL IP FOLLOW UP  RECOMMENDATIONS 06/26/2019 Follow up Recommendations Home health SLP   CHL IP FREQUENCY AND DURATION 06/26/2019 Speech Therapy Frequency (ACUTE ONLY) min 2x/week Treatment Duration 2 weeks      CHL IP ORAL PHASE 06/20/2019 Oral Phase Impaired Oral - Pudding Teaspoon -- Oral - Pudding Cup -- Oral - Honey Teaspoon -- Oral - Honey Cup Delayed oral transit Oral - Nectar Teaspoon -- Oral - Nectar Cup Delayed oral transit Oral - Nectar Straw -- Oral - Thin Teaspoon -- Oral - Thin Cup Delayed oral transit Oral - Thin Straw -- Oral - Puree -- Oral - Mech Soft -- Oral - Regular -- Oral - Multi-Consistency -- Oral - Pill -- Oral Phase - Comment --  CHL IP PHARYNGEAL PHASE 06/20/2019 Pharyngeal Phase Impaired Pharyngeal- Pudding Teaspoon -- Pharyngeal -- Pharyngeal- Pudding Cup -- Pharyngeal -- Pharyngeal- Honey Teaspoon -- Pharyngeal -- Pharyngeal- Honey Cup Penetration/Aspiration during swallow;Reduced pharyngeal peristalsis;Reduced epiglottic inversion;Reduced laryngeal elevation;Reduced airway/laryngeal closure Pharyngeal Material does not enter airway Pharyngeal- Nectar Teaspoon -- Pharyngeal -- Pharyngeal- Nectar Cup Reduced pharyngeal peristalsis;Reduced  epiglottic inversion;Reduced laryngeal elevation;Reduced airway/laryngeal closure;Pharyngeal residue - valleculae Pharyngeal -- Pharyngeal- Nectar Straw -- Pharyngeal -- Pharyngeal- Thin Teaspoon -- Pharyngeal -- Pharyngeal- Thin Cup Penetration/Aspiration during swallow;Reduced pharyngeal peristalsis;Reduced epiglottic inversion;Reduced laryngeal elevation;Reduced airway/laryngeal closure Pharyngeal Material enters airway, CONTACTS cords and then ejected out Pharyngeal- Thin Straw -- Pharyngeal -- Pharyngeal- Puree -- Pharyngeal -- Pharyngeal- Mechanical Soft -- Pharyngeal -- Pharyngeal- Regular -- Pharyngeal -- Pharyngeal- Multi-consistency -- Pharyngeal -- Pharyngeal- Pill -- Pharyngeal -- Pharyngeal Comment --  CHL IP CERVICAL ESOPHAGEAL PHASE 06/20/2019 Cervical Esophageal  Phase WFL Pudding Teaspoon -- Pudding Cup -- Honey Teaspoon -- Honey Cup -- Nectar Teaspoon -- Nectar Cup -- Nectar Straw -- Thin Teaspoon -- Thin Cup -- Thin Straw -- Puree -- Mechanical Soft -- Regular -- Multi-consistency -- Pill -- Cervical Esophageal Comment -- [name], MA CCC-SLP Acute Rehabilitation Services Pager [phone] Office [phone] Lynann Beaver 06/26/2019, 2:10 PM                   Scheduled Meds:  abacavir-dolutegravir-lamiVUDine  1 tablet Oral Daily   busPIRone  10 mg Per Tube TID   dexamethasone (DECADRON) injection  4 mg Intravenous Q12H   DULoxetine  30 mg Oral QPM   enoxaparin (LOVENOX) injection  40 mg Subcutaneous Q24H   feeding supplement (GLUCERNA SHAKE)  237 mL Oral BID BM   feeding supplement (PRO-STAT SUGAR FREE 64)  30 mL Per Tube Daily   gabapentin  600 mg Per Tube Q8H   insulin aspart  0-15 Units Subcutaneous Q4H   insulin aspart  6 Units Subcutaneous Q4H   insulin detemir  18 Units Subcutaneous Daily   pantoprazole  40 mg Oral BID   tamsulosin  0.4 mg Oral Daily   topiramate  50 mg Per Tube BID   vitamin B-12  5,000 mcg Per Tube Daily   Continuous Infusions:  sodium chloride 50 mL/hr at 06/26/19 2156   feeding supplement (JEVITY 1.2 CAL) 1,000 mL (06/26/19 2203)   methocarbamol (ROBAXIN) IV 500 mg (06/21/19 1239)     LOS: 7 days     Cordelia Poche, MD [facility] 06/27/2019, 2:13 PM  If 7PM-7AM, please contact night-coverage [email or web address]

## 2019-06-27 NOTE — Progress Notes (Signed)
  Speech Language Pathology Treatment: Dysphagia  Patient Details Name: Brian Malone MRN: [id number] DOB: [birth year] Today's Date: 06/27/2019 Time: 0850-0905 SLP Time Calculation (min) (ACUTE ONLY): 15 min  Assessment / Plan / Recommendation Clinical Impression  Pt is gradually attempting more bites and sips. He reports having a choking episode yesterday when RN attempted meds crushed in puree. He is not yet capable of reliably taking oral medications and has quite a bit of anxiety. Would recommend allowing another day of improvement and pt getting comfortable with oral intake before removing Cortrak, though tube removal will also improve swallow. Will f/u tomorrow  HPI HPI: Brian Malone is a 69 y.o. male with medical history significant of HIV, type 2 diabetes, hypertension, anxiety, recent C3-4 and C4-5 anterior discectomy and fusion by Dr. Vertell Novak at [facility] orthopedics presented to [place] ED for evaluation of dysphagia. Chest x-ray showing left basilar pneumonia. Per  chart MD recommended Decadron to aid in edema and inflammation.       SLP Plan  Continue with current plan of care       Recommendations  Diet recommendations: Dysphagia 1 (puree);Nectar-thick liquid Liquids provided via: Teaspoon;Cup Medication Administration: Via alternative means Supervision: Patient able to self feed Compensations: Slow rate;Small sips/bites;Follow solids with liquid Postural Changes and/or Swallow Maneuvers: Seated upright 90 degrees                Oral Care Recommendations: Oral care QID Follow up Recommendations: Home health SLP SLP Visit Diagnosis: Dysphagia, oropharyngeal phase (R13.12) Plan: Continue with current plan of care       GO               [name], MA [place] Pager [phone] Office [phone]  Lynann Beaver 06/27/2019, 10:24 AM

## 2019-06-27 NOTE — Care Management Important Message (Signed)
Important Message  Patient Details  Name: Brian Malone MRN: [id number] Date of Birth: 1951-10-21   Medicare Important Message Given:  Yes     Orbie Pyo 06/27/2019, 9:10 AM

## 2019-06-27 NOTE — Plan of Care (Signed)
  Problem: Clinical Measurements: Goal: Ability to maintain clinical measurements within normal limits will improve Outcome: Progressing Goal: Will remain free from infection Outcome: Progressing Goal: Diagnostic test results will improve Outcome: Progressing Goal: Respiratory complications will improve Outcome: Progressing Goal: Cardiovascular complication will be avoided Outcome: Progressing   Problem: Activity: Goal: Risk for activity intolerance will decrease Outcome: Progressing   Problem: Nutrition: Goal: Adequate nutrition will be maintained Outcome: Progressing   Problem: Elimination: Goal: Will not experience complications related to bowel motility Outcome: Progressing Goal: Will not experience complications related to urinary retention Outcome: Progressing   Problem: Pain Managment: Goal: General experience of comfort will improve Outcome: Progressing   Problem: Safety: Goal: Ability to remain free from injury will improve Outcome: Progressing   Problem: Skin Integrity: Goal: Risk for impaired skin integrity will decrease Outcome: Progressing   Ival Bible, BSN, RN

## 2019-06-27 NOTE — Plan of Care (Signed)
  Problem: Clinical Measurements: Goal: Ability to maintain clinical measurements within normal limits will improve Outcome: Progressing Goal: Will remain free from infection Outcome: Progressing Goal: Diagnostic test results will improve Outcome: Progressing Goal: Respiratory complications will improve Outcome: Progressing Goal: Cardiovascular complication will be avoided Outcome: Progressing   Problem: Activity: Goal: Risk for activity intolerance will decrease Outcome: Progressing   Problem: Nutrition: Goal: Adequate nutrition will be maintained Outcome: Progressing   Problem: Elimination: Goal: Will not experience complications related to bowel motility Outcome: Progressing Goal: Will not experience complications related to urinary retention Outcome: Progressing   Problem: Pain Managment: Goal: General experience of comfort will improve Outcome: Progressing   Problem: Safety: Goal: Ability to remain free from injury will improve Outcome: Progressing   Problem: Skin Integrity: Goal: Risk for impaired skin integrity will decrease Outcome: Progressing   Brian Malone, BSN, RN

## 2019-06-28 DIAGNOSIS — E872 Acidosis: Secondary | ICD-10-CM

## 2019-06-28 LAB — GLUCOSE, CAPILLARY
Glucose-Capillary: 132 mg/dL — ABNORMAL HIGH (ref 70–99)
Glucose-Capillary: 169 mg/dL — ABNORMAL HIGH (ref 70–99)
Glucose-Capillary: 175 mg/dL — ABNORMAL HIGH (ref 70–99)
Glucose-Capillary: 253 mg/dL — ABNORMAL HIGH (ref 70–99)
Glucose-Capillary: 258 mg/dL — ABNORMAL HIGH (ref 70–99)

## 2019-06-28 MED ORDER — DEXAMETHASONE 2 MG PO TABS: ORAL_TABLET | ORAL | 0 refills | Status: AC

## 2019-06-28 NOTE — Plan of Care (Signed)
Min assist adls. Cortrak out today with no adverse event.

## 2019-06-28 NOTE — TOC Initial Note (Signed)
Transition of Care [facility]) - Initial/Assessment Note    Patient Details  Name: Brian Malone MRN: [id number] Date of Birth: 16-Mar-1951  [facility]) CM/SW Contact:    Pollie Friar, RN Phone Number: 06/28/2019, 4:50 PM  Clinical Narrative:                   Expected Discharge Plan: [facility] Barriers to Discharge: No Barriers Identified   Patient Goals and CMS Choice   CMS Medicare.gov Compare Post Acute Care list provided to:: Patient Choice offered to / list presented to : Patient  Expected Discharge Plan and Services Expected Discharge Plan: [place]   Discharge Planning Services: CM Consult Post Acute Care Choice: [place] arrangements for the past 2 months: Hotel/Motel Expected Discharge Date: 06/28/19                         HH Arranged: PT HH Agency: [facility] (now [facility]) Date [place]: 06/28/19   Representative spoke with at [place]: Ramirez-Perez  Prior Living Arrangements/Services Living arrangements for the past 2 months: Hotel/Motel Lives with:: Self Patient language and need for interpreter reviewed:: Yes(no needs) Do you feel safe going back to the place where you live?: Yes      Need for Family Participation in Patient Care: No (Comment) Care giver support system in place?: No (comment)   Criminal Activity/Legal Involvement Pertinent to Current Situation/Hospitalization: No - Comment as needed  Activities of Daily Living Home Assistive Devices/Equipment: Cane (specify quad or straight) ADL Screening (condition at time of admission) Patient's cognitive ability adequate to safely complete daily activities?: Yes Is the patient deaf or have difficulty hearing?: No Does the patient have difficulty seeing, even when wearing glasses/contacts?: No Does the patient have difficulty concentrating, remembering, or making decisions?: No Patient able to express need for  assistance with ADLs?: Yes Does the patient have difficulty dressing or bathing?: No Independently performs ADLs?: Yes (appropriate for developmental age) Does the patient have difficulty walking or climbing stairs?: Yes Weakness of Legs: Both Weakness of Arms/Hands: Left  Permission Sought/Granted                  Emotional Assessment Appearance:: Appears stated age Attitude/Demeanor/Rapport: Engaged Affect (typically observed): Accepting Orientation: : Oriented to Self, Oriented to Place, Oriented to  Time, Oriented to Situation   Psych Involvement: No (comment)  Admission diagnosis:  Dehydration [E86.0] Oropharyngeal dysphagia [R13.12] Healthcare-associated pneumonia [J18.9] AKI (acute kidney injury) ([place]) [N17.9] Patient Active Problem List   Diagnosis Date Noted  . Dysphagia 06/20/2019  . AKI (acute kidney injury) ([place]) 06/20/2019  . HCAP (healthcare-associated pneumonia) 06/20/2019  . Normal anion gap metabolic acidosis 70/62/3762  . ARF (acute renal failure) ([place]) 06/19/2019  . Cervical spinal stenosis 05/18/2019  . Lumbar spondylosis 05/18/2019  . Sacroiliitis ([place]) 05/15/2019  . Right foot ulcer ([place]) 05/13/2019  . HIV (human immunodeficiency virus infection) ([place]) 05/13/2019  . Tachycardia 04/16/2019  . Diabetes ([place]) 04/16/2019  . Cellulitis 04/16/2019  . Wound infection after surgery 04/15/2019  . Benign prostatic hyperplasia (BPH) with straining on urination 02/22/2019  . Essential hypertension 02/22/2019  . Post-traumatic osteoarthritis of both ankles 02/22/2019  . Trigeminal neuralgia 02/22/2019  . Uncontrolled type 2 diabetes mellitus with hyperglycemia ([place]) 02/22/2019  . Chronic bilateral low back pain with bilateral sciatica 01/19/2019  . Diabetic autonomic neuropathy associated with type 2  diabetes mellitus ([place]) 01/19/2019  . Migraine with aura and without status migrainosus, not intractable 01/19/2019  . Neck pain 01/19/2019  . Neuropathy of  both feet 01/19/2019  . Nocturia more than twice per night 01/07/2015  . Anxiety 10/23/2014  . Candida, oral 10/23/2014  . Depression 10/23/2014  . Insomnia 10/23/2014  . Rash of entire body 10/23/2014   PCP:  Doreatha Lew, MD Pharmacy:   [place], [place] - [address] [place] [address] Phone: [phone] Fax: [place], [place] [address] Phone: [phone] Fax: [phone]  [id number] - [place] [address], [place] AT [facility] OF [address] [place] [address] Phone: [phone] Fax: [phone]     Social Determinants of Health (SDOH) Interventions    Readmission Risk Interventions No flowsheet data found.

## 2019-06-28 NOTE — Progress Notes (Signed)
  Speech Language Pathology Treatment: Dysphagia  Patient Details Name: Brian Malone MRN: [id number] DOB: 02-Sep-1951 Today's Date: 06/28/2019 Time: 6761-9509 SLP Time Calculation (min) (ACUTE ONLY): 15 min  Assessment / Plan / Recommendation Clinical Impression  Pt seen for a second time with lunch meal, NG tube out. Pt is tolerating nectar and thin liquids as well as purees. Has been able to take meds consistently, even some small whole meds. We discussed appropriate textures to try at home, methods for thickening if needed and how to safely advance diet as he continues to recover. Pt is independent at this point. No SLP f/u needed at home at this point. Reported to MD.   HPI HPI: Brian Malone is a 68 y.o. male with medical history significant of HIV, type 2 diabetes, hypertension, anxiety, recent C3-4 and C4-5 anterior discectomy and fusion by Dr. Vertell Novak at [facility] orthopedics presented to [place] ED for evaluation of dysphagia. Chest x-ray showing left basilar pneumonia. Per  chart MD recommended Decadron to aid in edema and inflammation.       SLP Plan  Continue with current plan of care       Recommendations  Diet recommendations: Dysphagia 1 (puree);Thin liquid Liquids provided via: Teaspoon;Cup Medication Administration: Via alternative means Supervision: Patient able to self feed Compensations: Slow rate;Small sips/bites;Follow solids with liquid Postural Changes and/or Swallow Maneuvers: Seated upright 90 degrees                Oral Care Recommendations: Oral care QID SLP Visit Diagnosis: Dysphagia, oropharyngeal phase (R13.12) Plan: Continue with current plan of care       GO               [name], MA [place] Pager [phone] Office [phone]  Lynann Beaver 06/28/2019, 2:06 PM

## 2019-06-28 NOTE — Progress Notes (Signed)
  Speech Language Pathology Treatment: Dysphagia  Patient Details Name: Brian Malone MRN: [id number] DOB: [birth year] Today's Date: 06/28/2019 Time: 7619-5093 SLP Time Calculation (min) (ACUTE ONLY): 12 min  Assessment / Plan / Recommendation Clinical Impression  Pt feeling so much better this am. He reports tolerating meds crushed in entirety yesterday pm, took his time. Anxiety much improved. SLP observed him with puree and nectar. Pt using strategies independenlty, even less throat clearing needed. Recommend Cortrak comes out. Pt is expected to swallow even more efficiently after its removal. May even be able to tolerate thinner textures of liquid such as ensure. Discussed with MD and RN. Will check back with pt this pm if possible.    HPI HPI: Brian Malone is a 68 y.o. male with medical history significant of HIV, type 2 diabetes, hypertension, anxiety, recent C3-4 and C4-5 anterior discectomy and fusion by Dr. Vertell Novak at [facility] orthopedics presented to [place] ED for evaluation of dysphagia. Chest x-ray showing left basilar pneumonia. Per  chart MD recommended Decadron to aid in edema and inflammation.       SLP Plan  Continue with current plan of care       Recommendations  Diet recommendations: Dysphagia 1 (puree);Nectar-thick liquid Liquids provided via: Teaspoon;Cup Medication Administration: Via alternative means Supervision: Patient able to self feed Compensations: Slow rate;Small sips/bites;Follow solids with liquid Postural Changes and/or Swallow Maneuvers: Seated upright 90 degrees                Oral Care Recommendations: Oral care QID Follow up Recommendations: Home health SLP SLP Visit Diagnosis: Dysphagia, oropharyngeal phase (R13.12) Plan: Continue with current plan of care       GO               Brian Baltimore, MA [place] Pager [phone] Office [phone]  Brian Malone 06/28/2019, 8:47 AM

## 2019-06-28 NOTE — Progress Notes (Signed)
Physical Therapy Evaluation Patient Details Name: Brian Malone MRN: [id number] DOB: [birth year] Today's Date: 06/28/2019   History of Present Illness  Patient is a 68 y/o male presenting eith dysphagia with patient s/p recent C3-4 and C4-5 anterior discectomy and fusion. PMH significant of recent C3-4 and C4-5 anterior discectomy and fusion. Admitted for dysphagia.     Clinical Impression  Patient presenting with the above listed diagnosis. Patient reports independence at baseline for mobility and ADLs. Patient today requiring use of SPC for mobility - mild instability with gait with cueing to promote increased BOS rather than scissoring gait pattern with good carryover. Ambulating in hallway and up/down 10 steps without LOB. Will recommend HHPT at discharge to progress balance. PT to continue to follow acutely.      Follow Up Recommendations Home health PT;Supervision - Intermittent    Equipment Recommendations  None recommended by PT    Recommendations for Other Services       Precautions / Restrictions Precautions Precautions: Fall;Cervical Required Braces or Orthoses: Cervical Brace Cervical Brace: Hard collar;At all times Restrictions Weight Bearing Restrictions: No      Mobility  Bed Mobility               General bed mobility comments: sitting EOB upon arrival  Transfers Overall transfer level: Needs assistance Equipment used: Rolling walker (2 wheeled) Transfers: Sit to/from Stand Sit to Stand: Min guard         General transfer comment: for safety and immediate standing balance  Ambulation/Gait Ambulation/Gait assistance: Min guard;Supervision Gait Distance (Feet): 200 Feet Assistive device: Rolling walker (2 wheeled);Straight cane Gait Pattern/deviations: Step-through pattern;Decreased stride length;Scissoring;Trunk flexed Gait velocity: WFL   General Gait Details: patient with tendency to watch feet with scissoring gait pattern - cueing to  increase BOS. no LOB or overt instability  Stairs Stairs: Yes Stairs assistance: Min guard;Supervision Stair Management: One rail Right;Alternating pattern;Forwards Number of Stairs: 10    Wheelchair Mobility    Modified Rankin (Stroke Patients Only)       Balance Overall balance assessment: Mild deficits observed, not formally tested                                           Pertinent Vitals/Pain Pain Assessment: No/denies pain    Home Living Family/patient expects to be discharged to:: Private residence Living Arrangements: Alone Available Help at Discharge: Friend(s);Available PRN/intermittently Type of Home: Apartment Home Access: Stairs to enter Entrance Stairs-Rails: Psychiatric nurse of Steps: 3 flights Home Layout: One level Home Equipment: Cane - single point      Prior Function Level of Independence: Independent               Hand Dominance        Extremity/Trunk Assessment   Upper Extremity Assessment Upper Extremity Assessment: Defer to OT evaluation    Lower Extremity Assessment Lower Extremity Assessment: Overall WFL for tasks assessed    Cervical / Trunk Assessment Cervical / Trunk Assessment: Normal  Communication   Communication: No difficulties  Cognition Arousal/Alertness: Awake/alert Behavior During Therapy: WFL for tasks assessed/performed Overall Cognitive Status: Within Functional Limits for tasks assessed                                        General Comments  Exercises     Assessment/Plan    PT Assessment Patient needs continued PT services  PT Problem List Decreased strength;Decreased activity tolerance;Decreased balance;Decreased mobility;Decreased knowledge of use of DME;Decreased safety awareness       PT Treatment Interventions DME instruction;Gait training;Functional mobility training;Therapeutic activities;Therapeutic exercise;Balance  training;Neuromuscular re-education;Patient/family education    PT Goals (Current goals can be found in the Care Plan section)  Acute Rehab PT Goals Patient Stated Goal: return home soon PT Goal Formulation: With patient Time For Goal Achievement: 07/12/19 Potential to Achieve Goals: Good    Frequency Min 3X/week   Barriers to discharge        Co-evaluation               AM-PAC PT "6 Clicks" Mobility  Outcome Measure Help needed turning from your back to your side while in a flat bed without using bedrails?: A Little Help needed moving from lying on your back to sitting on the side of a flat bed without using bedrails?: A Little Help needed moving to and from a bed to a chair (including a wheelchair)?: A Little Help needed standing up from a chair using your arms (e.g., wheelchair or bedside chair)?: A Little Help needed to walk in hospital room?: A Little Help needed climbing 3-5 steps with a railing? : A Little 6 Click Score: 18    End of Session Equipment Utilized During Treatment: Gait belt Activity Tolerance: Patient tolerated treatment well Patient left: in bed;with call bell/phone within reach Nurse Communication: Mobility status PT Visit Diagnosis: Other abnormalities of gait and mobility (R26.89);Unsteadiness on feet (R26.81);Muscle weakness (generalized) (M62.81)    Time: 6433-2951 PT Time Calculation (min) (ACUTE ONLY): 19 min   Charges:   PT Evaluation $PT Eval Moderate Complexity: 1 Mod          Lanney Gins, PT, DPT Supplemental Physical Therapist 06/28/19 2:00 PM Pager: [phone] Office: [phone]

## 2019-06-28 NOTE — TOC Transition Note (Signed)
Transition of Care [facility]) - CM/SW Discharge Note   Patient Details  Name: Brian Malone MRN: [id number] Date of Birth: November 09, 1951  Transition of Care [facility]) CM/SW Contact:  Pollie Friar, RN Phone Number: 06/28/2019, 4:50 PM   Clinical Narrative:    Pt discharging home with [facility] services. T[facility] with [facility] accepted the referral.  Pt trying to get a ride home but bedside RN has cab voucher if needed.     Final next level of care: Home w Home Health Services Barriers to Discharge: No Barriers Identified   Patient Goals and CMS Choice   CMS Medicare.gov Compare Post Acute Care list provided to:: Patient Choice offered to / list presented to : Patient  Discharge Placement                       Discharge Plan and Services   Discharge Planning Services: CM Consult Post Acute Care Choice: [place]: PT [place]: [facility] Date [place]: 06/28/19   Representative spoke with at [place]: [place] ([place]) Interventions     Readmission Risk Interventions No flowsheet data found.

## 2019-06-29 NOTE — Discharge Summary (Signed)
Physician Discharge Summary  Brian Malone EHM:[id number] DOB: 08-24-1951 DOA: 06/19/2019  PCP: Doreatha Lew, MD  Admit date: 06/19/2019 Discharge date: 06/29/2019  Admitted From: Home Disposition: Home  Recommendations for Outpatient Follow-up:  1. Follow up with PCP in 1 week 2. Please obtain BMP/CBC in one week 3. Please follow up on the following pending results: None  Home Health: PT Equipment/Devices: None  Discharge Condition: Stable CODE STATUS: Full code Diet recommendation: Dysphagia   Brief/Interim Summary:  Admission HPI written by Shela Leff, MD   Chief Complaint: Dysphagia  HPI: Brian Malone is a 68 y.o. male with medical history significant of HIV, type 2 diabetes, hypertension, anxiety, recent C3-4 and C4-5 anterior discectomy and fusion by Dr. Vertell Novak at [facility] orthopedics presented to [place] ED for evaluation of dysphagia.  Patient states he had neck surgery done a week ago and since then is having difficulty swallowing.  He is not able to swallow solids or liquids.  He was discharged from the hospital 2 days ago and since then has not been able to eat or drink anything as he starts to choke anytime he tries to eat or drink.  Reports having generalized weakness.  States he did not urinate for the past 2 days and finally had urine output after receiving IV fluid in the ED.  Denies any fevers, chills, chest pain, or shortness of breath.  Denies any vomiting, abdominal pain, or diarrhea.  States he has not been able to take any of his anxiety medications for the past few days due to difficulty swallowing and is feeling very anxious.  He is requesting a medication for anxiety.  States he is supposed to wear a c-collar until his next appointment with his surgeon sometime in mid July.  ED Course: Blood pressure soft on arrival, remainder vital stable.  Blood pressure improved with 2 L IV fluid boluses.  White count  14.8 with left shift.  Bicarb 19 and anion gap 15.  Blood glucose 244.  BUN 47, creatinine 2.7.  Baseline creatinine 1.2-1.4.  T bili 1.9, remainder of LFTs normal.  T bili was normal a month ago.  COVID-19 rapid test negative.  Chest x-ray showing left basilar pneumonia.  ED provider discussed the case with Dr. Felton Clinton who felt that patient symptoms are not unexpected after this type of surgery.  He recommended Decadron to help with postsurgical swelling and inflammation.  He did not feel that the patient needed to be admitted to [facility] or see them at this time. Patient received Decadron 10 mg and IV Levaquin in the ED.    Hos2pital course:  Dysphagia Secondary to recent neck surgery. Started on IV steroids and Cortrak placed for tube feeds. Speech therapy is on board. MBS with severe pharyngeal dysphagia. Repeat MBS on 7/7 improved and he has been advanced to a dysphagia 1 diet. Cortrak removed on 7/9 with continued improvement in swallowing. Discharged on Decadron taper after discussing with patient's surgeon.   AKI on CKD stage III Baseline creatinine of around 1.2-1.4. Creatinine peak of 2.78 and is now back to baseline. AKI resolved.  HCAP Treated with Levaquin. Completed treatment.  HIV Continue Triumeq  Diabetes mellitus, type 2 On Januvia as an outpatient. Resume  Metabolic acidosis Mild and in setting of AKI. Resolved.  Essential hypertension Blood pressure generally well controlled. On propranlol as an outpatient.  Hyperbilirubinemia Thought secondary to dehydration. Currently resolved.  Discharge Diagnoses:  Principal  Problem:   Dysphagia Active Problems:   Diabetes ([place])   Essential hypertension   AKI (acute kidney injury) ([place])   HCAP (healthcare-associated pneumonia)   Normal anion gap metabolic acidosis    Discharge Instructions  Discharge Instructions    Diet - low sodium heart healthy   Complete by: As directed    Increase activity  slowly   Complete by: As directed      Allergies as of 06/28/2019      Reactions   Morphine Itching   Hallucinations Aggression Altered      Medication List    STOP taking these medications   oxyCODONE 5 MG immediate release tablet Commonly known as: Oxy IR/ROXICODONE     TAKE these medications   busPIRone 10 MG tablet Commonly known as: BUSPAR Take 10 mg by mouth 3 (three) times daily.   dexamethasone 2 MG tablet Commonly known as: DECADRON Take 2 tablets (4 mg total) by mouth 2 (two) times daily for 2 days, THEN 1 tablet (2 mg total) 2 (two) times daily for 2 days, THEN 0.5 tablets (1 mg total) 2 (two) times daily for 4 days. Start taking on: June 28, 2019   DULoxetine 30 MG capsule Commonly known as: CYMBALTA Take 30 mg by mouth every evening.   gabapentin 300 MG capsule Commonly known as: Neurontin Take 1 capsule (300 mg total) by mouth 3 (three) times daily. What changed: how much to take   hydrOXYzine 50 MG capsule Commonly known as: VISTARIL Take 50 mg by mouth daily as needed for anxiety or itching.   Januvia 50 MG tablet Generic drug: sitaGLIPtin Take 50 mg by mouth daily.   mirabegron ER 50 MG Tb24 tablet Commonly known as: MYRBETRIQ Take 50 mg by mouth every evening.   pantoprazole 40 MG tablet Commonly known as: PROTONIX Take 40 mg by mouth daily.   propranolol ER 80 MG 24 hr capsule Commonly known as: INDERAL LA Take 80 mg by mouth daily.   rizatriptan 10 MG tablet Commonly known as: MAXALT Take 10 mg by mouth as needed for migraine.   tamsulosin 0.4 MG Caps capsule Commonly known as: FLOMAX Take 0.4 mg by mouth daily.   tiZANidine 4 MG tablet Commonly known as: ZANAFLEX Take 4 mg by mouth at bedtime.   topiramate 50 MG tablet Commonly known as: TOPAMAX Take 50 mg by mouth 2 (two) times daily.   Triumeq 600-50-300 MG tablet Generic drug: abacavir-dolutegravir-lamiVUDine Take 1 tablet by mouth daily.   Vitamin B-12 5000 MCG  Tbdp Take 5,000 mcg by mouth daily.   Vitamin D (Ergocalciferol) 1.25 MG (50000 UT) Caps capsule Commonly known as: DRISDOL Take 50,000 Units by mouth every 7 (seven) days.      Follow-up Information    Home, [facility] Follow up.   Specialty: Home Health Services Why: They will call you for the first visit. Contact information: [address] [place] [address] [phone]          Allergies  Allergen Reactions   Morphine Itching    Hallucinations Aggression Altered    Consultations:  None   Procedures/Studies: Dg Chest 2 View  Result Date: 06/19/2019 CLINICAL DATA:  Cough. Weakness and difficulty swallowing. Recent neck surgery. EXAM: CHEST - 2 VIEW COMPARISON:  Chest CTA 04/15/2019 and radiographs 05/09/2018 FINDINGS: The cardiomediastinal silhouette is unchanged with normal heart size. There is new left basilar airspace opacity which likely involves both the lower lobe and lingula. The right lung  is clear. No pleural effusion or pneumothorax is identified. No acute osseous abnormality is seen. IMPRESSION: Left basilar pneumonia. Electronically Signed   By: Logan Bores M.D.   On: 06/19/2019 19:07   Dg Abd 1 View  Result Date: 06/20/2019 CLINICAL DATA:  Feeding catheter placement EXAM: ABDOMEN - 1 VIEW COMPARISON:  None. FINDINGS: Feeding catheter was advanced into the stomach and subsequently into the fourth portion of the duodenum at the junction with the jejunum. This was confirmed with contrast injection. 7 minutes of fluoroscopy was utilized. IMPRESSION: Feeding catheter placement in the distal duodenum at the junction with the jejunum. Electronically Signed   By: Inez Catalina M.D.   On: 06/20/2019 15:41   Dg Swallowing Func-speech Pathology  Result Date: 06/26/2019 Objective Swallowing Evaluation: Type of Study: MBS-Modified Barium Swallow Study  Patient Details Name: Brian Malone MRN: [id number] Date of Birth: 29-May-1951 Today's Date: 06/26/2019  Time: SLP Start Time (ACUTE ONLY): 1300 -SLP Stop Time (ACUTE ONLY): 2683 SLP Time Calculation (min) (ACUTE ONLY): 23 min Past Medical History: Past Medical History: Diagnosis Date  Anxiety   Diabetes mellitus without complication ([place])   HIV (human immunodeficiency virus infection) ([place])   Hypertension   Renal disorder  Past Surgical History: Past Surgical History: Procedure Laterality Date  ANKLE ARTHROSCOPY    APPENDECTOMY    BACK SURGERY    FUSION  THORACOTOMY Right 2008  ?  TONSILLECTOMY   HPI: Brian Malone is a 68 y.o. male with medical history significant of HIV, type 2 diabetes, hypertension, anxiety, recent C3-4 and C4-5 anterior discectomy and fusion by Dr. Vertell Novak at [facility] orthopedics presented to [place] ED for evaluation of dysphagia. Chest x-ray showing left basilar pneumonia. Per  chart MD recommended Decadron to aid in edema and inflammation.  No data recorded Assessment / Plan / Recommendation CHL IP CLINICAL IMPRESSIONS 06/26/2019 Clinical Impression Pt demonstrates improvement in swallow function with decreased edema of posterior pharyngeal wall. He is able to swallow teaspoons of nectar thick liquids and puree with high penetration. He sustains airway closure and with multiple consecutive swallows is able to clear the bolus with mild residue. He requires alternating solids and liquids to clear residue. He is very sensitive to residue and also anxious. He is advised to gradually alternate sips of nectar and puree from floor stock and if this is tolerated to the point where pt feels he can nutritionally support himself we can consider removing NG tube tomorrow. Removal of tube will improve his function and increase his comfort. Will f/u tomorrow.  SLP Visit Diagnosis Dysphagia, oropharyngeal phase (R13.12) Attention and concentration deficit following -- Frontal lobe and executive function deficit following -- Impact on safety and function Mild aspiration  risk   CHL IP TREATMENT RECOMMENDATION 06/26/2019 Treatment Recommendations Therapy as outlined in treatment plan below   Prognosis 06/26/2019 Prognosis for Safe Diet Advancement Good Barriers to Reach Goals -- Barriers/Prognosis Comment -- CHL IP DIET RECOMMENDATION 06/26/2019 SLP Diet Recommendations Dysphagia 1 (Puree) solids;Nectar thick liquid Liquid Administration via Spoon;Cup Medication Administration Via alternative means Compensations Slow rate;Small sips/bites;Follow solids with liquid Postural Changes Remain semi-upright after after feeds/meals (Comment)   CHL IP OTHER RECOMMENDATIONS 06/26/2019 Recommended Consults -- Oral Care Recommendations Oral care BID Other Recommendations --   CHL IP FOLLOW UP RECOMMENDATIONS 06/26/2019 Follow up Recommendations Home health SLP   CHL IP FREQUENCY AND DURATION 06/26/2019 Speech Therapy Frequency (ACUTE ONLY) min 2x/week Treatment Duration 2 weeks  CHL IP ORAL PHASE 06/20/2019 Oral Phase Impaired Oral - Pudding Teaspoon -- Oral - Pudding Cup -- Oral - Honey Teaspoon -- Oral - Honey Cup Delayed oral transit Oral - Nectar Teaspoon -- Oral - Nectar Cup Delayed oral transit Oral - Nectar Straw -- Oral - Thin Teaspoon -- Oral - Thin Cup Delayed oral transit Oral - Thin Straw -- Oral - Puree -- Oral - Mech Soft -- Oral - Regular -- Oral - Multi-Consistency -- Oral - Pill -- Oral Phase - Comment --  CHL IP PHARYNGEAL PHASE 06/20/2019 Pharyngeal Phase Impaired Pharyngeal- Pudding Teaspoon -- Pharyngeal -- Pharyngeal- Pudding Cup -- Pharyngeal -- Pharyngeal- Honey Teaspoon -- Pharyngeal -- Pharyngeal- Honey Cup Penetration/Aspiration during swallow;Reduced pharyngeal peristalsis;Reduced epiglottic inversion;Reduced laryngeal elevation;Reduced airway/laryngeal closure Pharyngeal Material does not enter airway Pharyngeal- Nectar Teaspoon -- Pharyngeal -- Pharyngeal- Nectar Cup Reduced pharyngeal peristalsis;Reduced epiglottic inversion;Reduced laryngeal elevation;Reduced airway/laryngeal  closure;Pharyngeal residue - valleculae Pharyngeal -- Pharyngeal- Nectar Straw -- Pharyngeal -- Pharyngeal- Thin Teaspoon -- Pharyngeal -- Pharyngeal- Thin Cup Penetration/Aspiration during swallow;Reduced pharyngeal peristalsis;Reduced epiglottic inversion;Reduced laryngeal elevation;Reduced airway/laryngeal closure Pharyngeal Material enters airway, CONTACTS cords and then ejected out Pharyngeal- Thin Straw -- Pharyngeal -- Pharyngeal- Puree -- Pharyngeal -- Pharyngeal- Mechanical Soft -- Pharyngeal -- Pharyngeal- Regular -- Pharyngeal -- Pharyngeal- Multi-consistency -- Pharyngeal -- Pharyngeal- Pill -- Pharyngeal -- Pharyngeal Comment --  CHL IP CERVICAL ESOPHAGEAL PHASE 06/20/2019 Cervical Esophageal Phase WFL Pudding Teaspoon -- Pudding Cup -- Honey Teaspoon -- Honey Cup -- Nectar Teaspoon -- Nectar Cup -- Nectar Straw -- Thin Teaspoon -- Thin Cup -- Thin Straw -- Puree -- Mechanical Soft -- Regular -- Multi-consistency -- Pill -- Cervical Esophageal Comment -- [name], MA CCC-SLP Acute Rehabilitation Services Pager [phone] Office [phone] Lynann Beaver 06/26/2019, 2:10 PM              Dg Swallowing Func-speech Pathology  Result Date: 06/20/2019 Objective Swallowing Evaluation: Type of Study: MBS-Modified Barium Swallow Study  Patient Details Name: Brian Malone MRN: [id number] Date of Birth: 25-Feb-1951 Today's Date: 06/20/2019 Time: SLP Start Time (ACUTE ONLY): 1310 -SLP Stop Time (ACUTE ONLY): 1327 SLP Time Calculation (min) (ACUTE ONLY): 17 min Past Medical History: Past Medical History: Diagnosis Date  Anxiety   Diabetes mellitus without complication ([place])   HIV (human immunodeficiency virus infection) ([place])   Hypertension   Renal disorder  Past Surgical History: Past Surgical History: Procedure Laterality Date  ANKLE ARTHROSCOPY    APPENDECTOMY    BACK SURGERY    FUSION  THORACOTOMY Right 2008  ?  TONSILLECTOMY   HPI: Brian Malone is a 68 y.o. male with medical  history significant of HIV, type 2 diabetes, hypertension, anxiety, recent C3-4 and C4-5 anterior discectomy and fusion by Dr. Vertell Novak at [facility] orthopedics presented to [facility] ED for evaluation of dysphagia. Chest x-ray showing left basilar pneumonia. Per  chart MD recommended Decadron to aid in edema and inflammation.  No data recorded Assessment / Plan / Recommendation CHL IP CLINICAL IMPRESSIONS 06/20/2019 Clinical Impression Pt exhibited severe pharyngeal dysphagia marked by significant pharyngeal edema impeding efficient swallow sequences and resulting in a non funtional swallow. His hyoid moves anteriorally but is unable to achieve adequate elevation and edematous tissue impedes adequate tongue base and pharyngeal contraction. Epiglottis does not invert therefore nectar barium enters laryngeal vestibule prematurely reaching cords. He performs multiple rapid sequential swallows attempting to mobilize bolus but only small amount enters UES. Barium is propelled into oral cavity  due to decreased necessary pressure, reflexive cough or via volitional movement to oral cavity. Pt coughs in response to penetrates which appear to be expelled from vestibule. As edema decreases pt's prognosis for safe intake improves significantly. Per RN and pt he is receiving steriods to decrease swelling. Recommend NPO; MD contacted in xray for radiology technicial to place Cortrak. Encouraged pt to continue attempts at swallowing saliva as able. Will continue to intervene in treatment.   SLP Visit Diagnosis Dysphagia, oropharyngeal phase (R13.12) Attention and concentration deficit following -- Frontal lobe and executive function deficit following -- Impact on safety and function Severe aspiration risk   CHL IP TREATMENT RECOMMENDATION 06/20/2019 Treatment Recommendations Therapy as outlined in treatment plan below   Prognosis 06/20/2019 Prognosis for Safe Diet Advancement Good Barriers to Reach Goals --  Barriers/Prognosis Comment -- CHL IP DIET RECOMMENDATION 06/20/2019 SLP Diet Recommendations NPO Liquid Administration via -- Medication Administration Via alternative means Compensations -- Postural Changes --   CHL IP OTHER RECOMMENDATIONS 06/20/2019 Recommended Consults -- Oral Care Recommendations Oral care BID Other Recommendations --   CHL IP FOLLOW UP RECOMMENDATIONS 06/20/2019 Follow up Recommendations Other (comment)   CHL IP FREQUENCY AND DURATION 06/20/2019 Speech Therapy Frequency (ACUTE ONLY) min 2x/week Treatment Duration 2 weeks      CHL IP ORAL PHASE 06/20/2019 Oral Phase Impaired Oral - Pudding Teaspoon -- Oral - Pudding Cup -- Oral - Honey Teaspoon -- Oral - Honey Cup Delayed oral transit Oral - Nectar Teaspoon -- Oral - Nectar Cup Delayed oral transit Oral - Nectar Straw -- Oral - Thin Teaspoon -- Oral - Thin Cup Delayed oral transit Oral - Thin Straw -- Oral - Puree -- Oral - Mech Soft -- Oral - Regular -- Oral - Multi-Consistency -- Oral - Pill -- Oral Phase - Comment --  CHL IP PHARYNGEAL PHASE 06/20/2019 Pharyngeal Phase Impaired Pharyngeal- Pudding Teaspoon -- Pharyngeal -- Pharyngeal- Pudding Cup -- Pharyngeal -- Pharyngeal- Honey Teaspoon -- Pharyngeal -- Pharyngeal- Honey Cup Penetration/Aspiration during swallow;Reduced pharyngeal peristalsis;Reduced epiglottic inversion;Reduced laryngeal elevation;Reduced airway/laryngeal closure Pharyngeal Material does not enter airway Pharyngeal- Nectar Teaspoon -- Pharyngeal -- Pharyngeal- Nectar Cup Reduced pharyngeal peristalsis;Reduced epiglottic inversion;Reduced laryngeal elevation;Reduced airway/laryngeal closure;Pharyngeal residue - valleculae Pharyngeal -- Pharyngeal- Nectar Straw -- Pharyngeal -- Pharyngeal- Thin Teaspoon -- Pharyngeal -- Pharyngeal- Thin Cup Penetration/Aspiration during swallow;Reduced pharyngeal peristalsis;Reduced epiglottic inversion;Reduced laryngeal elevation;Reduced airway/laryngeal closure Pharyngeal Material enters airway,  CONTACTS cords and then ejected out Pharyngeal- Thin Straw -- Pharyngeal -- Pharyngeal- Puree -- Pharyngeal -- Pharyngeal- Mechanical Soft -- Pharyngeal -- Pharyngeal- Regular -- Pharyngeal -- Pharyngeal- Multi-consistency -- Pharyngeal -- Pharyngeal- Pill -- Pharyngeal -- Pharyngeal Comment --  CHL IP CERVICAL ESOPHAGEAL PHASE 06/20/2019 Cervical Esophageal Phase WFL Pudding Teaspoon -- Pudding Cup -- Honey Teaspoon -- Honey Cup -- Nectar Teaspoon -- Nectar Cup -- Nectar Straw -- Thin Teaspoon -- Thin Cup -- Thin Straw -- Puree -- Mechanical Soft -- Regular -- Multi-consistency -- Pill -- Cervical Esophageal Comment -- Houston Siren 06/20/2019, 5:11 PM Orbie Pyo Litaker M.Ed Actor Pager [phone] Office [phone]                 Subjective: Swallowing better.  Discharge Exam: Vitals:   06/28/19 1142 06/28/19 1630  BP: (!) 142/90 (!) 143/103  Pulse: 72 (!) 101  Resp: 18 18  Temp: 98.5 F (36.9 C) 98 F (36.7 C)  SpO2: 100% 99%   Vitals:   06/28/19 0455 06/28/19 0901 06/28/19 1142 06/28/19 1630  BP:  Marland Kitchen)  141/95 (!) 142/90 (!) 143/103  Pulse:  81 72 (!) 101  Resp:  18 18 18   Temp:  97.6 F (36.4 C) 98.5 F (36.9 C) 98 F (36.7 C)  TempSrc:  Oral Oral Oral  SpO2:  100% 100% 99%  Weight: 94.3 kg     Height:        General: Pt is alert, awake, not in acute distress Cardiovascular: RRR, S1/S2 +, no rubs, no gallops Respiratory: CTA bilaterally, no wheezing, no rhonchi Abdominal: Soft, NT, ND, bowel sounds + Extremities: no edema, no cyanosis    The results of significant diagnostics from this hospitalization (including imaging, microbiology, ancillary and laboratory) are listed below for reference.     Microbiology: Recent Results (from the past 240 hour(s))  SARS Coronavirus 2 (CEPHEID - Performed in [place] hospital lab), Hosp Order     Status: None   Collection Time: 06/20/19  5:40 AM   Specimen: Nasopharyngeal Swab  Result Value Ref  Range Status   SARS Coronavirus 2 NEGATIVE NEGATIVE Final    Comment: (NOTE) If result is NEGATIVE SARS-CoV-2 target nucleic acids are NOT DETECTED. The SARS-CoV-2 RNA is generally detectable in upper and lower  respiratory specimens during the acute phase of infection. The lowest  concentration of SARS-CoV-2 viral copies this assay can detect is 250  copies / mL. A negative result does not preclude SARS-CoV-2 infection  and should not be used as the sole basis for treatment or other  patient management decisions.  A negative result may occur with  improper specimen collection / handling, submission of specimen other  than nasopharyngeal swab, presence of viral mutation(s) within the  areas targeted by this assay, and inadequate number of viral copies  (<250 copies / mL). A negative result must be combined with clinical  observations, patient history, and epidemiological information. If result is POSITIVE SARS-CoV-2 target nucleic acids are DETECTED. The SARS-CoV-2 RNA is generally detectable in upper and lower  respiratory specimens dur ing the acute phase of infection.  Positive  results are indicative of active infection with SARS-CoV-2.  Clinical  correlation with patient history and other diagnostic information is  necessary to determine patient infection status.  Positive results do  not rule out bacterial infection or co-infection with other viruses. If result is PRESUMPTIVE POSTIVE SARS-CoV-2 nucleic acids MAY BE PRESENT.   A presumptive positive result was obtained on the submitted specimen  and confirmed on repeat testing.  While 2019 novel coronavirus  (SARS-CoV-2) nucleic acids may be present in the submitted sample  additional confirmatory testing may be necessary for epidemiological  and / or clinical management purposes  to differentiate between  SARS-CoV-2 and other Sarbecovirus currently known to infect humans.  If clinically indicated additional testing with an  alternate test  methodology [phone]) is advised. The SARS-CoV-2 RNA is generally  detectable in upper and lower respiratory sp ecimens during the acute  phase of infection. The expected result is Negative. Fact Sheet for Patients:  StrictlyIdeas.no Fact Sheet for Healthcare Providers: BankingDealers.co.za This test is not yet approved or cleared by the [place] FDA and has been authorized for detection and/or diagnosis of SARS-CoV-2 by FDA under an Emergency Use Authorization (EUA).  This EUA will remain in effect (meaning this test can be used) for the duration of the COVID-19 declaration under Section 564(b)(1) of the Act, 21 U.S.C. section 360bbb-3(b)(1), unless the authorization is terminated or revoked sooner. Performed at [facility], [place] [address],  [place], [place] [address]      Labs: BNP (last 3 results) No results for input(s): BNP in the last 8760 hours. Basic Metabolic Panel: Recent Labs  Lab 06/24/19 0557 06/27/19 0335  NA 134* 133*  K 4.6 4.3  CL 103 101  CO2 20* 22  GLUCOSE 281* 196*  BUN 25* 28*  CREATININE 1.21 1.13  CALCIUM 9.1 9.0   Liver Function Tests: No results for input(s): AST, ALT, ALKPHOS, BILITOT, PROT, ALBUMIN in the last 168 hours. No results for input(s): LIPASE, AMYLASE in the last 168 hours. No results for input(s): AMMONIA in the last 168 hours. CBC: Recent Labs  Lab 06/24/19 0557 06/27/19 0335  WBC 11.5* 10.3  HGB 13.9 13.5  HCT 41.1 40.4  MCV 101.5* 100.7*  PLT 185 202   Cardiac Enzymes: No results for input(s): CKTOTAL, CKMB, CKMBINDEX, TROPONINI in the last 168 hours. BNP: Invalid input(s): POCBNP CBG: Recent Labs  Lab 06/27/19 2359 06/28/19 0410 06/28/19 0855 06/28/19 1141 06/28/19 1627  GLUCAP 253* 258* 175* 132* 169*   D-Dimer No results for input(s): DDIMER in the last 72 hours. Hgb A1c No results for input(s): HGBA1C in the last 72 hours. Lipid  Profile No results for input(s): CHOL, HDL, LDLCALC, TRIG, CHOLHDL, LDLDIRECT in the last 72 hours. Thyroid function studies No results for input(s): TSH, T4TOTAL, T3FREE, THYROIDAB in the last 72 hours.  Invalid input(s): FREET3 Anemia work up No results for input(s): VITAMINB12, FOLATE, FERRITIN, TIBC, IRON, RETICCTPCT in the last 72 hours. Urinalysis No results found for: COLORURINE, APPEARANCEUR, [place], [place], GLUCOSEU, [place], [place], KETONESUR, PROTEINUR, UROBILINOGEN, NITRITE, LEUKOCYTESUR Sepsis Labs Invalid input(s): PROCALCITONIN,  WBC,  LACTICIDVEN Microbiology Recent Results (from the past 240 hour(s))  SARS Coronavirus 2 (CEPHEID - Performed in [place] hospital lab), Hosp Order     Status: None   Collection Time: 06/20/19  5:40 AM   Specimen: Nasopharyngeal Swab  Result Value Ref Range Status   SARS Coronavirus 2 NEGATIVE NEGATIVE Final    Comment: (NOTE) If result is NEGATIVE SARS-CoV-2 target nucleic acids are NOT DETECTED. The SARS-CoV-2 RNA is generally detectable in upper and lower  respiratory specimens during the acute phase of infection. The lowest  concentration of SARS-CoV-2 viral copies this assay can detect is 250  copies / mL. A negative result does not preclude SARS-CoV-2 infection  and should not be used as the sole basis for treatment or other  patient management decisions.  A negative result may occur with  improper specimen collection / handling, submission of specimen other  than nasopharyngeal swab, presence of viral mutation(s) within the  areas targeted by this assay, and inadequate number of viral copies  (<250 copies / mL). A negative result must be combined with clinical  observations, patient history, and epidemiological information. If result is POSITIVE SARS-CoV-2 target nucleic acids are DETECTED. The SARS-CoV-2 RNA is generally detectable in upper and lower  respiratory specimens dur ing the acute phase of infection.   Positive  results are indicative of active infection with SARS-CoV-2.  Clinical  correlation with patient history and other diagnostic information is  necessary to determine patient infection status.  Positive results do  not rule out bacterial infection or co-infection with other viruses. If result is PRESUMPTIVE POSTIVE SARS-CoV-2 nucleic acids MAY BE PRESENT.   A presumptive positive result was obtained on the submitted specimen  and confirmed on repeat testing.  While 2019 novel coronavirus  (SARS-CoV-2) nucleic acids may be present in the submitted sample  additional confirmatory testing may be necessary for epidemiological  and / or clinical management purposes  to differentiate between  SARS-CoV-2 and other Sarbecovirus currently known to infect humans.  If clinically indicated additional testing with an alternate test  methodology [phone]) is advised. The SARS-CoV-2 RNA is generally  detectable in upper and lower respiratory sp ecimens during the acute  phase of infection. The expected result is Negative. Fact Sheet for Patients:  StrictlyIdeas.no Fact Sheet for Healthcare Providers: BankingDealers.co.za This test is not yet approved or cleared by the [place] FDA and has been authorized for detection and/or diagnosis of SARS-CoV-2 by FDA under an Emergency Use Authorization (EUA).  This EUA will remain in effect (meaning this test can be used) for the duration of the COVID-19 declaration under Section 564(b)(1) of the Act, 21 U.S.C. section 360bbb-3(b)(1), unless the authorization is terminated or revoked sooner. Performed at [facility], [place] [address] [place], [place] [address]      Time coordinating discharge: 35 minutes  SIGNED:   Cordelia Poche, MD [facility] 06/29/2019, 6:09 PM

## 2019-07-07 ENCOUNTER — Encounter (HOSPITAL_BASED_OUTPATIENT_CLINIC_OR_DEPARTMENT_OTHER): Payer: Self-pay | Admitting: Adult Health

## 2019-07-07 ENCOUNTER — Emergency Department (HOSPITAL_BASED_OUTPATIENT_CLINIC_OR_DEPARTMENT_OTHER): Payer: Medicare Other

## 2019-07-07 ENCOUNTER — Emergency Department (HOSPITAL_BASED_OUTPATIENT_CLINIC_OR_DEPARTMENT_OTHER)
Admission: EM | Admit: 2019-07-07 | Discharge: 2019-07-07 | Disposition: A | Discharge status: Home / Self Care | Payer: Medicare Other | Attending: Emergency Medicine | Admitting: Emergency Medicine

## 2019-07-07 ENCOUNTER — Other Ambulatory Visit: Payer: Self-pay

## 2019-07-07 DIAGNOSIS — M542 Cervicalgia: Secondary | ICD-10-CM | POA: Insufficient documentation

## 2019-07-07 DIAGNOSIS — Z21 Asymptomatic human immunodeficiency virus [HIV] infection status: Secondary | ICD-10-CM | POA: Diagnosis not present

## 2019-07-07 DIAGNOSIS — Z79899 Other long term (current) drug therapy: Secondary | ICD-10-CM | POA: Diagnosis not present

## 2019-07-07 DIAGNOSIS — I10 Essential (primary) hypertension: Secondary | ICD-10-CM | POA: Diagnosis not present

## 2019-07-07 DIAGNOSIS — Z7984 Long term (current) use of oral hypoglycemic drugs: Secondary | ICD-10-CM | POA: Diagnosis not present

## 2019-07-07 DIAGNOSIS — E119 Type 2 diabetes mellitus without complications: Secondary | ICD-10-CM | POA: Insufficient documentation

## 2019-07-07 MED ORDER — LIDOCAINE 5 % EX PTCH: 1.0000 | MEDICATED_PATCH | CUTANEOUS | 0 refills | Status: DC

## 2019-07-07 MED ORDER — OXYCODONE HCL 5 MG PO TABS
5.0000 mg | ORAL_TABLET | Freq: Once | ORAL | Status: AC
  Administered 2019-07-07: 17:00:00 5 mg via ORAL
  Filled 2019-07-07: qty 1

## 2019-07-07 MED ORDER — CYCLOBENZAPRINE HCL 10 MG PO TABS: 10.0000 mg | ORAL_TABLET | Freq: Two times a day (BID) | ORAL | 0 refills | Status: DC | PRN

## 2019-07-07 NOTE — ED Provider Notes (Signed)
[facility] EMERGENCY DEPARTMENT Provider Note   CSN: [id number] Arrival date & time: 07/07/19  1548    History   Chief Complaint Chief Complaint  Patient presents with  . Neck Pain    HPI Brian Malone is a 68 y.o. male.     HPI   Woke up with neck pain and with it in awkward presentation Reports hx of PTSD and will sometimes act out nightmares while in his sleep, not sure what he did last night while he was sleeping but when he woke this AM was facedown rolled up. Sleeps in collar for next 4 weeks.  Has not had any neck trauma Started taking tylenol this AM Was seen here end of June one week after surgery with concern for difficulty swallowing, admitted for dehydration and pneumonia Has been doing well over the last week, decreased pain, swallowing problems better. For the last week has needed no pain medications other than chronic gabapentin  Today having nagging neck pain 5/10. Took an oxycodone he had left over which helped. Staying still helps. Hurting 2 finger widths below the base of skull in middle of spine. Feels some crepitus which is new.  No fevers. Still has numbness in bilateral arms, no new numbness or weakness Spoke with on call provider from Dr. Marcelline Deist office, saw NP this week and things looked good   Past Medical History:  Diagnosis Date  . Anxiety   . Diabetes mellitus without complication ([place])   . HIV (human immunodeficiency virus infection) ([place])   . Hypertension   . Renal disorder     Patient Active Problem List   Diagnosis Date Noted  . Dysphagia 06/20/2019  . AKI (acute kidney injury) ([place]) 06/20/2019  . HCAP (healthcare-associated pneumonia) 06/20/2019  . Normal anion gap metabolic acidosis 40/98/1191  . ARF (acute renal failure) ([place]) 06/19/2019  . Cervical spinal stenosis 05/18/2019  . Lumbar spondylosis 05/18/2019  . Sacroiliitis ([place]) 05/15/2019  . Right foot ulcer ([place]) 05/13/2019  . HIV (human immunodeficiency  virus infection) ([place]) 05/13/2019  . Tachycardia 04/16/2019  . Diabetes ([place]) 04/16/2019  . Cellulitis 04/16/2019  . Wound infection after surgery 04/15/2019  . Benign prostatic hyperplasia (BPH) with straining on urination 02/22/2019  . Essential hypertension 02/22/2019  . Post-traumatic osteoarthritis of both ankles 02/22/2019  . Trigeminal neuralgia 02/22/2019  . Uncontrolled type 2 diabetes mellitus with hyperglycemia ([place]) 02/22/2019  . Chronic bilateral low back pain with bilateral sciatica 01/19/2019  . Diabetic autonomic neuropathy associated with type 2 diabetes mellitus ([place]) 01/19/2019  . Migraine with aura and without status migrainosus, not intractable 01/19/2019  . Neck pain 01/19/2019  . Neuropathy of both feet 01/19/2019  . Nocturia more than twice per night 01/07/2015  . Anxiety 10/23/2014  . Candida, oral 10/23/2014  . Depression 10/23/2014  . Insomnia 10/23/2014  . Rash of entire body 10/23/2014    Past Surgical History:  Procedure Laterality Date  . ANKLE ARTHROSCOPY    . APPENDECTOMY    . BACK SURGERY     FUSION  . THORACOTOMY Right 2008  ?  . TONSILLECTOMY          Home Medications    Prior to Admission medications   Medication Sig Start Date End Date Taking? Authorizing Provider  busPIRone (BUSPAR) 10 MG tablet Take 10 mg by mouth 3 (three) times daily.  01/22/19  Yes [provider]  Cyanocobalamin (VITAMIN B-12) 5000 MCG TBDP Take 5,000 mcg by mouth daily.  Yes [provider]  DULoxetine (CYMBALTA) 30 MG capsule Take 30 mg by mouth every evening.  01/22/19  Yes [provider]  gabapentin (NEURONTIN) 300 MG capsule Take 1 capsule (300 mg total) by mouth 3 (three) times daily. Patient taking differently: Take 600 mg by mouth 3 (three) times daily.  02/10/19  Yes Carmin Muskrat, MD  JANUVIA 50 MG tablet Take 50 mg by mouth daily. 06/18/19  Yes [provider]  mirabegron ER (MYRBETRIQ) 50 MG TB24 tablet Take 50 mg  by mouth every evening.    Yes [provider]  pantoprazole (PROTONIX) 40 MG tablet Take 40 mg by mouth daily.  02/12/19  Yes [provider]  propranolol ER (INDERAL LA) 80 MG 24 hr capsule Take 80 mg by mouth daily.  02/12/19  Yes [provider]  rizatriptan (MAXALT) 10 MG tablet Take 10 mg by mouth as needed for migraine.  03/21/19  Yes [provider]  tamsulosin (FLOMAX) 0.4 MG CAPS capsule Take 0.4 mg by mouth daily.  02/12/19  Yes [provider]  tiZANidine (ZANAFLEX) 4 MG tablet Take 4 mg by mouth at bedtime.  03/08/19  Yes [provider]  topiramate (TOPAMAX) 50 MG tablet Take 50 mg by mouth 2 (two) times daily.  03/21/19  Yes [provider]  TRIUMEQ 600-50-300 MG tablet Take 1 tablet by mouth daily.  02/12/19  Yes [provider]  Vitamin D, Ergocalciferol, (DRISDOL) 1.25 MG (50000 UT) CAPS capsule Take 50,000 Units by mouth every 7 (seven) days.  03/21/19  Yes [provider]  cyclobenzaprine (FLEXERIL) 10 MG tablet Take 1 tablet (10 mg total) by mouth 2 (two) times daily as needed for muscle spasms. 07/07/19   Gareth Morgan, MD  hydrOXYzine (VISTARIL) 50 MG capsule Take 50 mg by mouth daily as needed for anxiety or itching.  01/22/19   [provider]  lidocaine (LIDODERM) 5 % Place 1 patch onto the skin daily. Remove & Discard patch within 12 hours or as directed by MD 07/07/19   Gareth Morgan, MD    Family History Family History  Problem Relation Age of Onset  . CAD Mother   . Lung cancer Mother   . CAD Father   . Lung cancer Father   . CAD Brother     Social History Social History   Tobacco Use  . Smoking status: Never Smoker  . Smokeless tobacco: Never Used  Substance Use Topics  . Alcohol use: Not Currently    Frequency: Never  . Drug use: Never     Allergies   Morphine   Review of Systems Review of Systems  Constitutional: Negative for fever.  HENT: Negative for sore  throat.   Eyes: Negative for visual disturbance.  Respiratory: Negative for cough and shortness of breath.   Cardiovascular: Negative for chest pain.  Gastrointestinal: Positive for nausea (when pain severe). Negative for abdominal pain and vomiting.  Genitourinary: Negative for difficulty urinating.  Musculoskeletal: Positive for neck pain and neck stiffness. Negative for back pain.  Skin: Negative for rash.  Neurological: Negative for syncope, weakness and headaches. Numbness: no change.     Physical Exam Updated Vital Signs BP 126/89 (BP Location: Right Arm)   Pulse 74   Temp 98.1 F (36.7 C) (Oral)   Resp 16   SpO2 100%   Physical Exam Vitals signs and nursing note reviewed.  Constitutional:      General: He is not in acute distress.  Appearance: He is well-developed. He is not diaphoretic.  HENT:     Head: Normocephalic and atraumatic.  Eyes:     Conjunctiva/sclera: Conjunctivae normal.  Neck:     Musculoskeletal: Normal range of motion.  Cardiovascular:     Rate and Rhythm: Normal rate and regular rhythm.  Pulmonary:     Effort: Pulmonary effort is normal. No respiratory distress.  Abdominal:     General: There is no distension.     Palpations: Abdomen is soft.     Tenderness: There is no abdominal tenderness.  Musculoskeletal:     Cervical back: He exhibits no bony tenderness (denies tenderness but notes pain in this location).  Skin:    General: Skin is warm and dry.     Findings: No erythema (no erythema to anterior or posterior neck).  Neurological:     Mental Status: He is alert and oriented to person, place, and time.      ED Treatments / Results  Labs (all labs ordered are listed, but only abnormal results are displayed) Labs Reviewed - No data to display  EKG None  Radiology Ct Cervical Spine Wo Contrast  Result Date: 07/07/2019 CLINICAL DATA:  Status post cervical fusion 3 weeks ago with neck pain and upper extremity numbness and  tingling. EXAM: CT CERVICAL SPINE WITHOUT CONTRAST TECHNIQUE: Multidetector CT imaging of the cervical spine was performed without intravenous contrast. Multiplanar CT image reconstructions were also generated. COMPARISON:  MRI cervical spine dated 04/03/2019. FINDINGS: Alignment: Normal. Skull base and vertebrae: No acute fracture. No primary bone lesion or focal pathologic process. The patient is status post prior anterior fusion from C3 through C5. Interbody spaces there is are noted at the C3-C4 and C4-C5 levels. Soft tissues and spinal canal: Again noted is spinal canal narrowing from the C3 through C5 level. There is mild spinal canal narrowing at the C6-C7 level. There is no significant prevertebral soft tissue swelling. There is multilevel moderate to severe osseous neural foraminal narrowing, greatest at the C3 through C5 levels. Disc levels: Multilevel disc height loss is noted, greatest at the C6-C7 level. Upper chest: Negative. Other: None. IMPRESSION: 1. No acute abnormality detected. 2. Status post ACDF as detailed above with no evidence of a hardware complication or hardware failure. 3. Multilevel degenerative changes as detailed above resulting in multilevel osseous neural foraminal narrowing bilaterally. Electronically Signed   By: Constance Holster M.D.   On: 07/07/2019 17:13    Procedures Procedures (including critical care time)  Medications Ordered in ED Medications  oxyCODONE (Oxy IR/ROXICODONE) immediate release tablet 5 mg (5 mg Oral Given 07/07/19 1700)     Initial Impression / Assessment and Plan / ED Course  I have reviewed the triage vital signs and the nursing notes.  Pertinent labs & imaging results that were available during my care of the patient were reviewed by me and considered in my medical decision making (see chart for details).         68 year old male with a history of C3-4 and C4-5 anterior discectomy with Dr. Octavio Manns [place] 6/23, DM, HIV,  hypertension, admission 6/30 for pneumonia, dysphagia and dehydration, presents with concern for worsening neck pain this AM after "fighting demons" in his sleep.  No new neurologic symptoms or fevers.  CT cervical spine completed showing no acute abnormalities, no evidence of hardware complication or failure.  Possible muscular strain as etiology. Given rx for lidocaine patch, flexeril. Called [facility] on call and notified Dr. Stann Mainland of  pt presentation to ED and plan, and recommends pt follow up with Dr. Octavio Manns this week. Patient discharged in stable condition with understanding of reasons to return.     Final Clinical Impressions(s) / ED Diagnoses   Final diagnoses:  Neck pain    ED Discharge Orders         Ordered    cyclobenzaprine (FLEXERIL) 10 MG tablet  2 times daily PRN     07/07/19 1754    lidocaine (LIDODERM) 5 %  Every 24 hours     07/07/19 1754           Gareth Morgan, MD 07/08/19 1345

## 2019-07-07 NOTE — ED Notes (Signed)
ED Provider at bedside. 

## 2019-07-07 NOTE — ED Triage Notes (Signed)
Presents with neck pain that began this AM. He had surgery on the 23rd and has not had any trouble with pain for a week, but today he woke up in an awkward position and had pain.

## 2019-07-09 ENCOUNTER — Other Ambulatory Visit: Payer: Self-pay

## 2019-07-09 ENCOUNTER — Encounter: Payer: Self-pay | Admitting: Podiatry

## 2019-07-09 ENCOUNTER — Ambulatory Visit (INDEPENDENT_AMBULATORY_CARE_PROVIDER_SITE_OTHER): Payer: Medicare Other

## 2019-07-09 ENCOUNTER — Ambulatory Visit (INDEPENDENT_AMBULATORY_CARE_PROVIDER_SITE_OTHER): Payer: Self-pay | Admitting: Podiatry

## 2019-07-09 DIAGNOSIS — E1140 Type 2 diabetes mellitus with diabetic neuropathy, unspecified: Secondary | ICD-10-CM | POA: Diagnosis not present

## 2019-07-09 DIAGNOSIS — M2041 Other hammer toe(s) (acquired), right foot: Secondary | ICD-10-CM | POA: Diagnosis not present

## 2019-07-09 DIAGNOSIS — M79674 Pain in right toe(s): Secondary | ICD-10-CM

## 2019-07-09 DIAGNOSIS — S52125A Nondisplaced fracture of head of left radius, initial encounter for closed fracture: Secondary | ICD-10-CM | POA: Diagnosis not present

## 2019-07-09 DIAGNOSIS — M2042 Other hammer toe(s) (acquired), left foot: Secondary | ICD-10-CM | POA: Diagnosis not present

## 2019-07-09 DIAGNOSIS — M216X2 Other acquired deformities of left foot: Secondary | ICD-10-CM | POA: Diagnosis not present

## 2019-07-09 DIAGNOSIS — Z09 Encounter for follow-up examination after completed treatment for conditions other than malignant neoplasm: Secondary | ICD-10-CM

## 2019-07-09 DIAGNOSIS — S92401A Displaced unspecified fracture of right great toe, initial encounter for closed fracture: Secondary | ICD-10-CM

## 2019-07-09 DIAGNOSIS — M216X1 Other acquired deformities of right foot: Secondary | ICD-10-CM | POA: Diagnosis not present

## 2019-07-10 ENCOUNTER — Emergency Department (HOSPITAL_BASED_OUTPATIENT_CLINIC_OR_DEPARTMENT_OTHER): Payer: Medicare Other

## 2019-07-10 ENCOUNTER — Other Ambulatory Visit: Payer: Self-pay

## 2019-07-10 ENCOUNTER — Emergency Department (HOSPITAL_BASED_OUTPATIENT_CLINIC_OR_DEPARTMENT_OTHER)
Admission: EM | Admit: 2019-07-10 | Discharge: 2019-07-10 | Disposition: A | Discharge status: Home / Self Care | Payer: Medicare Other | Attending: Emergency Medicine | Admitting: Emergency Medicine

## 2019-07-10 ENCOUNTER — Encounter (HOSPITAL_BASED_OUTPATIENT_CLINIC_OR_DEPARTMENT_OTHER): Payer: Self-pay

## 2019-07-10 DIAGNOSIS — B349 Viral infection, unspecified: Secondary | ICD-10-CM | POA: Insufficient documentation

## 2019-07-10 DIAGNOSIS — R05 Cough: Secondary | ICD-10-CM

## 2019-07-10 DIAGNOSIS — Z20828 Contact with and (suspected) exposure to other viral communicable diseases: Secondary | ICD-10-CM | POA: Diagnosis not present

## 2019-07-10 DIAGNOSIS — E119 Type 2 diabetes mellitus without complications: Secondary | ICD-10-CM | POA: Insufficient documentation

## 2019-07-10 DIAGNOSIS — I10 Essential (primary) hypertension: Secondary | ICD-10-CM | POA: Diagnosis not present

## 2019-07-10 DIAGNOSIS — Z21 Asymptomatic human immunodeficiency virus [HIV] infection status: Secondary | ICD-10-CM | POA: Insufficient documentation

## 2019-07-10 DIAGNOSIS — R059 Cough, unspecified: Secondary | ICD-10-CM

## 2019-07-10 LAB — COMPREHENSIVE METABOLIC PANEL
ALT: 13 U/L (ref 0–44)
AST: 13 U/L — ABNORMAL LOW (ref 15–41)
Albumin: 3.6 g/dL (ref 3.5–5.0)
Alkaline Phosphatase: 89 U/L (ref 38–126)
Anion gap: 10 (ref 5–15)
BUN: 22 mg/dL (ref 8–23)
CO2: 21 mmol/L — ABNORMAL LOW (ref 22–32)
Calcium: 8.6 mg/dL — ABNORMAL LOW (ref 8.9–10.3)
Chloride: 106 mmol/L (ref 98–111)
Creatinine, Ser: 1.26 mg/dL — ABNORMAL HIGH (ref 0.61–1.24)
GFR calc Af Amer: >60 mL/min (ref >60)
GFR calc non Af Amer: 59 mL/min — ABNORMAL LOW (ref >60)
Glucose, Bld: 168 mg/dL — ABNORMAL HIGH (ref 70–99)
Potassium: 4 mmol/L (ref 3.5–5.1)
Sodium: 137 mmol/L (ref 135–145)
Total Bilirubin: 1.5 mg/dL — ABNORMAL HIGH (ref 0.3–1.2)
Total Protein: 7.1 g/dL (ref 6.5–8.1)

## 2019-07-10 LAB — CBC WITH DIFFERENTIAL/PLATELET
Abs Immature Granulocytes: 0.02 10*3/uL (ref 0.00–0.07)
Basophils Absolute: 0 10*3/uL (ref 0.0–0.1)
Basophils Relative: 0 %
Eosinophils Absolute: 0.1 10*3/uL (ref 0.0–0.5)
Eosinophils Relative: 2 %
HCT: 37.9 % — ABNORMAL LOW (ref 39.0–52.0)
Hemoglobin: 12.3 g/dL — ABNORMAL LOW (ref 13.0–17.0)
Immature Granulocytes: 0 %
Lymphocytes Relative: 11 %
Lymphs Abs: 0.8 10*3/uL (ref 0.7–4.0)
MCH: 34 pg (ref 26.0–34.0)
MCHC: 32.5 g/dL (ref 30.0–36.0)
MCV: 104.7 fL — ABNORMAL HIGH (ref 80.0–100.0)
Monocytes Absolute: 0.7 10*3/uL (ref 0.1–1.0)
Monocytes Relative: 9 %
Neutro Abs: 5.8 10*3/uL (ref 1.7–7.7)
Neutrophils Relative %: 78 %
Platelets: 132 10*3/uL — ABNORMAL LOW (ref 150–400)
RBC: 3.62 MIL/uL — ABNORMAL LOW (ref 4.22–5.81)
RDW: 13.2 % (ref 11.5–15.5)
WBC: 7.4 10*3/uL (ref 4.0–10.5)
nRBC: 0 % (ref 0.0–0.2)

## 2019-07-10 MED ORDER — BENZONATATE 100 MG PO CAPS: 100.0000 mg | ORAL_CAPSULE | Freq: Three times a day (TID) | ORAL | 0 refills | Status: DC | PRN

## 2019-07-10 NOTE — ED Triage Notes (Signed)
/  guh x 3 days-NAD-steady gait-pt with [place] ccollar in place

## 2019-07-10 NOTE — Discharge Instructions (Signed)
Please call your primary doctor to schedule a close follow-up appointment within 48 hours regarding the symptoms you are having today.  May take the prescribed medicine as needed for your cough.  Recommend using Tylenol as needed for fevers, pain.  Recommend following isolation precautions as discussed.  If you have any worsening of your cough, develop difficulty breathing, fevers or other new concerns symptom, please return to the emergency department for reevaluation.         Person Under Monitoring Name: Brian Malone  Location: [address] [place] [address]   Infection Prevention Recommendations for Individuals Confirmed to have, or Being Evaluated for, 2019 Novel Coronavirus (COVID-19) Infection Who Receive Care at Home  Individuals who are confirmed to have, or are being evaluated for, COVID-19 should follow the prevention steps below until a healthcare provider or local or state health department says they can return to normal activities.  Stay home except to get medical care You should restrict activities outside your home, except for getting medical care. Do not go to work, school, or public areas, and do not use public transportation or taxis.  Call ahead before visiting your doctor Before your medical appointment, call the healthcare provider and tell them that you have, or are being evaluated for, COVID-19 infection. This will help the healthcare providers office take steps to keep other people from getting infected. Ask your healthcare provider to call the local or state health department.  Monitor your symptoms Seek prompt medical attention if your illness is worsening (e.g., difficulty breathing). Before going to your medical appointment, call the healthcare provider and tell them that you have, or are being evaluated for, COVID-19 infection. Ask your healthcare provider to call the local or state health department.  Wear a facemask You should  wear a facemask that covers your nose and mouth when you are in the same room with other people and when you visit a healthcare provider. People who live with or visit you should also wear a facemask while they are in the same room with you.  Separate yourself from other people in your home As much as possible, you should stay in a different room from other people in your home. Also, you should use a separate bathroom, if available.  Avoid sharing household items You should not share dishes, drinking glasses, cups, eating utensils, towels, bedding, or other items with other people in your home. After using these items, you should wash them thoroughly with soap and water.  Cover your coughs and sneezes Cover your mouth and nose with a tissue when you cough or sneeze, or you can cough or sneeze into your sleeve. Throw used tissues in a lined trash can, and immediately wash your hands with soap and water for at least 20 seconds or use an alcohol-based hand rub.  Wash your [facility] your hands often and thoroughly with soap and water for at least 20 seconds. You can use an alcohol-based hand sanitizer if soap and water are not available and if your hands are not visibly dirty. Avoid touching your eyes, nose, and mouth with unwashed hands.   Prevention Steps for Caregivers and Household Members of Individuals Confirmed to have, or Being Evaluated for, COVID-19 Infection Being Cared for in the Home  If you live with, or provide care at home for, a person confirmed to have, or being evaluated for, COVID-19 infection please follow these guidelines to prevent infection:  Follow healthcare providers instructions Make sure that  you understand and can help the patient follow any healthcare provider instructions for all care.  Provide for the patients basic needs You should help the patient with basic needs in the home and provide support for getting groceries, prescriptions, and other  personal needs.  Monitor the patients symptoms If they are getting sicker, call his or her medical provider and tell them that the patient has, or is being evaluated for, COVID-19 infection. This will help the healthcare providers office take steps to keep other people from getting infected. Ask the healthcare provider to call the local or state health department.  Limit the number of people who have contact with the patient If possible, have only one caregiver for the patient. Other household members should stay in another home or place of residence. If this is not possible, they should stay in another room, or be separated from the patient as much as possible. Use a separate bathroom, if available. Restrict visitors who do not have an essential need to be in the home.  Keep older adults, very young children, and other sick people away from the patient Keep older adults, very young children, and those who have compromised immune systems or chronic health conditions away from the patient. This includes people with chronic heart, lung, or kidney conditions, diabetes, and cancer.  Ensure good ventilation Make sure that shared spaces in the home have good air flow, such as from an air conditioner or an opened window, weather permitting.  Wash your hands often Wash your hands often and thoroughly with soap and water for at least 20 seconds. You can use an alcohol based hand sanitizer if soap and water are not available and if your hands are not visibly dirty. Avoid touching your eyes, nose, and mouth with unwashed hands. Use disposable paper towels to dry your hands. If not available, use dedicated cloth towels and replace them when they become wet.  Wear a facemask and gloves Wear a disposable facemask at all times in the room and gloves when you touch or have contact with the patients blood, body fluids, and/or secretions or excretions, such as sweat, saliva, sputum, nasal mucus, vomit,  urine, or feces.  Ensure the mask fits over your nose and mouth tightly, and do not touch it during use. Throw out disposable facemasks and gloves after using them. Do not reuse. Wash your hands immediately after removing your facemask and gloves. If your personal clothing becomes contaminated, carefully remove clothing and launder. Wash your hands after handling contaminated clothing. Place all used disposable facemasks, gloves, and other waste in a lined container before disposing them with other household waste. Remove gloves and wash your hands immediately after handling these items.  Do not share dishes, glasses, or other household items with the patient Avoid sharing household items. You should not share dishes, drinking glasses, cups, eating utensils, towels, bedding, or other items with a patient who is confirmed to have, or being evaluated for, COVID-19 infection. After the person uses these items, you should wash them thoroughly with soap and water.  Wash laundry thoroughly Immediately remove and wash clothes or bedding that have blood, body fluids, and/or secretions or excretions, such as sweat, saliva, sputum, nasal mucus, vomit, urine, or feces, on them. Wear gloves when handling laundry from the patient. Read and follow directions on labels of laundry or clothing items and detergent. In general, wash and dry with the warmest temperatures recommended on the label.  Clean all areas the individual has used  often Clean all touchable surfaces, such as counters, tabletops, doorknobs, bathroom fixtures, toilets, phones, keyboards, tablets, and bedside tables, every day. Also, clean any surfaces that may have blood, body fluids, and/or secretions or excretions on them. Wear gloves when cleaning surfaces the patient has come in contact with. Use a diluted bleach solution (e.g., dilute bleach with 1 part bleach and 10 parts water) or a household disinfectant with a label that says  EPA-registered for coronaviruses. To make a bleach solution at home, add 1 tablespoon of bleach to 1 quart (4 cups) of water. For a larger supply, add  cup of bleach to 1 gallon (16 cups) of water. Read labels of cleaning products and follow recommendations provided on product labels. Labels contain instructions for safe and effective use of the cleaning product including precautions you should take when applying the product, such as wearing gloves or eye protection and making sure you have good ventilation during use of the product. Remove gloves and wash hands immediately after cleaning.  Monitor yourself for signs and symptoms of illness Caregivers and household members are considered close contacts, should monitor their health, and will be asked to limit movement outside of the home to the extent possible. Follow the monitoring steps for close contacts listed on the symptom monitoring form.   ? If you have additional questions, contact your local health department or call the epidemiologist on call at [phone] (available 24/7). ? This guidance is subject to change. For the most up-to-date guidance from [facility], please refer to their website: YouBlogs.pl

## 2019-07-10 NOTE — ED Provider Notes (Signed)
[facility] Provider Note MRN:  [id number]  Arrival date & time: 07/10/19     Chief Complaint   Cough   History of Present Illness   Brian Malone is a 68 y.o. year-old male with a history of HIV, type 2 diabetes, hypertension, anxiety, recent C3-4 and C4-5 anterior discectomyand fusionby Dr. Vertell Novak at [facility]  presenting to the ED with chief complaint of cough.  Patient states symptoms started on Sunday.  Have been steadily worsening.  States cough is increasing frequency, making it difficult to sleep at night.  Has taken some Mucinex with minimal improvement.  States had low-grade 99 temperature at home.  Mildly productive, small green in nature.  Denies any difficulty breathing except when he has a bad coughing fit.  Denies chest pain.  Has noted some chest discomfort when he has a bad cough.  Denies any sick contacts, no recent travel.  Patient had an admission in June for pneumonia, thought to be due to aspiration.  Was temporarily on tube feeds.  Patient states he has been compliant with his prescribed diet.  States he has not had any recent missed doses of his HIV medicine, states this has been well controlled and follows with ID at [facility]  A complete 10 system review of systems was obtained and all systems are negative except as noted in the HPI and PMH.   Patient's Health History    Past Medical History:  Diagnosis Date  . Anxiety   . Diabetes mellitus without complication ([place])   . HIV (human immunodeficiency virus infection) ([place])   . Hypertension   . Renal disorder     Past Surgical History:  Procedure Laterality Date  . ANKLE ARTHROSCOPY    . APPENDECTOMY    . BACK SURGERY     FUSION  . THORACOTOMY Right 2008  ?  . TONSILLECTOMY      Family History  Problem Relation Age of Onset  . CAD Mother   . Lung cancer Mother   . CAD Father   . Lung cancer Father   . CAD Brother     Social  History   Socioeconomic History  . Marital status: Divorced    Spouse name: Not on file  . Number of children: Not on file  . Years of education: Not on file  . Highest education level: Not on file  Occupational History  . Not on file  Social Needs  . Financial resource strain: Not on file  . Food insecurity    Worry: Not on file    Inability: Not on file  . Transportation needs    Medical: Not on file    Non-medical: Not on file  Tobacco Use  . Smoking status: Never Smoker  . Smokeless tobacco: Never Used  Substance and Sexual Activity  . Alcohol use: Not Currently    Frequency: Never  . Drug use: Never  . Sexual activity: Not on file  Lifestyle  . Physical activity    Days per week: Not on file    Minutes per session: Not on file  . Stress: Not on file  Relationships  . Social Herbalist on phone: Not on file    Gets together: Not on file    Attends religious service: Not on file    Active member of club or organization: Not on file    Attends meetings of clubs or organizations: Not  on file    Relationship status: Not on file  . Intimate partner violence    Fear of current or ex partner: Not on file    Emotionally abused: Not on file    Physically abused: Not on file    Forced sexual activity: Not on file  Other Topics Concern  . Not on file  Social History Narrative  . Not on file     Physical Exam  Vital Signs and Nursing Notes reviewed Vitals:   07/10/19 1355 07/10/19 1602  BP: 105/85 (!) 162/88  Pulse: 94 85  Resp: 16   Temp: 100.3 F (37.9 C) 98.9 F (37.2 C)  SpO2: 100% 99%    CONSTITUTIONAL:  well-appearing, NAD, frequent cough NEURO:  Alert and oriented x 3, no focal deficits EYES:  eyes equal and reactive ENT/NECK:  no LAD, no JVD CARDIO:  regular rate, well-perfused, normal S1 and S2 PULM:  CTAB no wheezing or rhonchi GI/GU:  normal bowel sounds, non-distended, non-tender MSK/SPINE:  No gross deformities, no edema SKIN:  no  rash, atraumatic PSYCH:  Appropriate speech and behavior  Diagnostic and Interventional Summary    EKG Interpretation  Date/Time:    Ventricular Rate:    PR Interval:    QRS Duration:   QT Interval:    QTC Calculation:   R Axis:     Text Interpretation:        Labs Reviewed  CBC WITH DIFFERENTIAL/PLATELET - Abnormal; Notable for the following components:      Result Value   RBC 3.62 (*)    Hemoglobin 12.3 (*)    HCT 37.9 (*)    MCV 104.7 (*)    Platelets 132 (*)    All other components within normal limits  COMPREHENSIVE METABOLIC PANEL - Abnormal; Notable for the following components:   CO2 21 (*)    Glucose, Bld 168 (*)    Creatinine, Ser 1.26 (*)    Calcium 8.6 (*)    AST 13 (*)    Total Bilirubin 1.5 (*)    GFR calc non Af Amer 59 (*)    All other components within normal limits    DG Chest Port 1 View    (Results Pending)    Medications - No data to display   Procedures  none Critical Care none  ED Course and Medical Decision Making  I have reviewed the triage vital signs and the nursing notes.  Pertinent labs & imaging results that were available during my care of the patient were reviewed by me and considered in my medical decision making (see below for details).   Clinical Course as of Jul 09 1808  Tue Jul 10, 2019  1606 Completed chart review, reviewed triage note   [RD]  1650 Completed initial assessment, extensive PMH, frequent cough, but well appearing, NAD   [RD]  1752 Rechecked, reviewed results, remains well appearing, will dc   [RD]    Clinical Course User Index [RD] Lucrezia Starch, MD     68 year old gentleman extensive past medical history including HIV, CKD, DM, C-spine disease, recent mission for dysphagia, aspiration pneumonia presents emerged from chief complaint cough.  Here patient well-appearing, stable vital signs, no tachycardia, tachypnea, hypoxia noted.  Clear lungs.  Labs WNL, chest x-ray clear, seeing improvement from  prior admission.  Suspect patient has acute viral upper respiratory illness.  Will send COVID swab.  At this time fully appropriate for outpatient management.  Reviewed structural precautions with patient and recommended  close PCP follow-up in 24 to 48 hours.  After the discussed management above, the patient was determined to be safe for discharge.  The patient was in agreement with this plan and all questions regarding their care were answered.  ED return precautions were discussed and the patient will return to the ED with any significant worsening of condition.   Madalyn Rob, MD [place] [email or web address]  Final Clinical Impressions(s) / ED Diagnoses     ICD-10-CM   1. Acute viral syndrome  B34.9   2. Cough  R05 DG Chest [place] Chest [place] 1 View    ED Discharge Orders    None         Lucrezia Starch, MD 07/10/19 [phone]

## 2019-07-11 NOTE — Progress Notes (Signed)
   Subjective:  68 year old male with PMHx of HIV and DM presents today status post ORIF of the right hallux. DOS: 04/04/2019. He states he is doing well. He reports some minimal pain but reports he has been healing appropriately. He denies any modifying factors. He has been using topical antibiotic ointment and has finished all the antibiotics he was prescribed. Patient is here for further evaluation and treatment.   Past Medical History:  Diagnosis Date  . Anxiety   . Diabetes mellitus without complication ([place])   . HIV (human immunodeficiency virus infection) ([place])   . Hypertension   . Renal disorder       Objective/Physical Exam Neurovascular status intact.  Skin incisions appear to be well coapted. No sign of infectious process noted. No dehiscence. No active bleeding noted. Moderate edema noted to the surgical extremity.  Radiographic Exam:  Osteotomies sites appear to be stable and fracture sites indicate routine healing.    Assessment: 1. s/p ORIF right hallux. DOS: 04/04/2019 2. Right hallux cellulitis - resolved    Plan of Care:  1. Patient was evaluated. X-Rays reviewed.  2. DM shoes and insoles dispensed today.  3. May resume full activity with no restrictions.  4. Return to clinic as needed.     Edrick Kins, DPM [facility]  Dr. Edrick Kins, [place]                                        [place], [place] [address]                Office [phone]  Fax [phone]

## 2019-07-14 LAB — NOVEL CORONAVIRUS, NAA (HOSP ORDER, SEND-OUT TO REF LAB; TAT 18-24 HRS): SARS-CoV-2, NAA: NOT DETECTED

## 2019-07-20 ENCOUNTER — Other Ambulatory Visit: Payer: Self-pay | Admitting: Podiatry

## 2019-07-20 DIAGNOSIS — S52125A Nondisplaced fracture of head of left radius, initial encounter for closed fracture: Secondary | ICD-10-CM

## 2019-10-25 ENCOUNTER — Encounter (HOSPITAL_COMMUNITY): Payer: Self-pay | Admitting: Emergency Medicine

## 2019-10-25 ENCOUNTER — Emergency Department (HOSPITAL_COMMUNITY): Payer: Medicare Other

## 2019-10-25 ENCOUNTER — Other Ambulatory Visit: Payer: Self-pay

## 2019-10-25 ENCOUNTER — Inpatient Hospital Stay (HOSPITAL_COMMUNITY)
Admission: EM | Admit: 2019-10-25 | Discharge: 2019-10-28 | DRG: 087 | Disposition: A | Discharge status: Home Health | Payer: Medicare Other | Attending: Neurosurgery | Admitting: Neurosurgery

## 2019-10-25 DIAGNOSIS — M47816 Spondylosis without myelopathy or radiculopathy, lumbar region: Secondary | ICD-10-CM | POA: Diagnosis present

## 2019-10-25 DIAGNOSIS — S01511A Laceration without foreign body of lip, initial encounter: Secondary | ICD-10-CM | POA: Diagnosis present

## 2019-10-25 DIAGNOSIS — Z20828 Contact with and (suspected) exposure to other viral communicable diseases: Secondary | ICD-10-CM | POA: Diagnosis present

## 2019-10-25 DIAGNOSIS — S065XAA Traumatic subdural hemorrhage with loss of consciousness status unknown, initial encounter: Secondary | ICD-10-CM | POA: Diagnosis present

## 2019-10-25 DIAGNOSIS — S065X9A Traumatic subdural hemorrhage with loss of consciousness of unspecified duration, initial encounter: Secondary | ICD-10-CM | POA: Diagnosis present

## 2019-10-25 DIAGNOSIS — Z8249 Family history of ischemic heart disease and other diseases of the circulatory system: Secondary | ICD-10-CM

## 2019-10-25 DIAGNOSIS — G629 Polyneuropathy, unspecified: Secondary | ICD-10-CM | POA: Diagnosis present

## 2019-10-25 DIAGNOSIS — M4802 Spinal stenosis, cervical region: Secondary | ICD-10-CM | POA: Diagnosis present

## 2019-10-25 DIAGNOSIS — S065X0A Traumatic subdural hemorrhage without loss of consciousness, initial encounter: Secondary | ICD-10-CM | POA: Diagnosis not present

## 2019-10-25 DIAGNOSIS — F329 Major depressive disorder, single episode, unspecified: Secondary | ICD-10-CM | POA: Diagnosis present

## 2019-10-25 DIAGNOSIS — Z7984 Long term (current) use of oral hypoglycemic drugs: Secondary | ICD-10-CM

## 2019-10-25 DIAGNOSIS — Z888 Allergy status to other drugs, medicaments and biological substances status: Secondary | ICD-10-CM

## 2019-10-25 DIAGNOSIS — Z801 Family history of malignant neoplasm of trachea, bronchus and lung: Secondary | ICD-10-CM

## 2019-10-25 DIAGNOSIS — M461 Sacroiliitis, not elsewhere classified: Secondary | ICD-10-CM | POA: Diagnosis present

## 2019-10-25 DIAGNOSIS — N400 Enlarged prostate without lower urinary tract symptoms: Secondary | ICD-10-CM | POA: Diagnosis present

## 2019-10-25 DIAGNOSIS — G43909 Migraine, unspecified, not intractable, without status migrainosus: Secondary | ICD-10-CM | POA: Diagnosis present

## 2019-10-25 DIAGNOSIS — F419 Anxiety disorder, unspecified: Secondary | ICD-10-CM | POA: Diagnosis present

## 2019-10-25 DIAGNOSIS — R519 Headache, unspecified: Secondary | ICD-10-CM | POA: Diagnosis not present

## 2019-10-25 DIAGNOSIS — I10 Essential (primary) hypertension: Secondary | ICD-10-CM | POA: Diagnosis present

## 2019-10-25 DIAGNOSIS — Z23 Encounter for immunization: Secondary | ICD-10-CM

## 2019-10-25 DIAGNOSIS — Z885 Allergy status to narcotic agent status: Secondary | ICD-10-CM

## 2019-10-25 DIAGNOSIS — Z21 Asymptomatic human immunodeficiency virus [HIV] infection status: Secondary | ICD-10-CM | POA: Diagnosis present

## 2019-10-25 LAB — ETHANOL: Alcohol, Ethyl (B): <10 mg/dL (ref <10)

## 2019-10-25 LAB — BASIC METABOLIC PANEL
Anion gap: 12 (ref 5–15)
BUN: 15 mg/dL (ref 8–23)
CO2: 19 mmol/L — ABNORMAL LOW (ref 22–32)
Calcium: 8.9 mg/dL (ref 8.9–10.3)
Chloride: 104 mmol/L (ref 98–111)
Creatinine, Ser: 1.36 mg/dL — ABNORMAL HIGH (ref 0.61–1.24)
GFR calc Af Amer: >60 mL/min (ref >60)
GFR calc non Af Amer: 54 mL/min — ABNORMAL LOW (ref >60)
Glucose, Bld: 135 mg/dL — ABNORMAL HIGH (ref 70–99)
Potassium: 3.5 mmol/L (ref 3.5–5.1)
Sodium: 135 mmol/L (ref 135–145)

## 2019-10-25 LAB — CBC
HCT: 37.8 % — ABNORMAL LOW (ref 39.0–52.0)
Hemoglobin: 12.8 g/dL — ABNORMAL LOW (ref 13.0–17.0)
MCH: 34.3 pg — ABNORMAL HIGH (ref 26.0–34.0)
MCHC: 33.9 g/dL (ref 30.0–36.0)
MCV: 101.3 fL — ABNORMAL HIGH (ref 80.0–100.0)
Platelets: 151 10*3/uL (ref 150–400)
RBC: 3.73 MIL/uL — ABNORMAL LOW (ref 4.22–5.81)
RDW: 13.5 % (ref 11.5–15.5)
WBC: 7.7 10*3/uL (ref 4.0–10.5)
nRBC: 0 % (ref 0.0–0.2)

## 2019-10-25 LAB — RAPID URINE DRUG SCREEN, HOSP PERFORMED
Amphetamines: POSITIVE — AB
Barbiturates: NOT DETECTED
Benzodiazepines: NOT DETECTED
Cocaine: NOT DETECTED
Opiates: POSITIVE — AB
Tetrahydrocannabinol: NOT DETECTED

## 2019-10-25 MED ORDER — FENTANYL CITRATE (PF) 100 MCG/2ML IJ SOLN
75.0000 ug | Freq: Once | INTRAMUSCULAR | Status: AC
  Administered 2019-10-25: 75 ug via INTRAVENOUS
  Filled 2019-10-25: qty 2

## 2019-10-25 MED ORDER — IOHEXOL 300 MG/ML  SOLN
100.0000 mL | Freq: Once | INTRAMUSCULAR | Status: AC | PRN
  Administered 2019-10-25: 100 mL via INTRAVENOUS
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
## 2019-10-25 NOTE — ED Notes (Signed)
Patient transported to X-ray 
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
## 2019-10-25 NOTE — ED Notes (Signed)
csi at bedside
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
## 2019-10-25 NOTE — ED Notes (Signed)
ED Provider at bedside. 

## 2019-10-26 ENCOUNTER — Observation Stay (HOSPITAL_COMMUNITY): Payer: Medicare Other

## 2019-10-26 DIAGNOSIS — S065XAA Traumatic subdural hemorrhage with loss of consciousness status unknown, initial encounter: Secondary | ICD-10-CM | POA: Diagnosis present

## 2019-10-26 DIAGNOSIS — S065X9A Traumatic subdural hemorrhage with loss of consciousness of unspecified duration, initial encounter: Secondary | ICD-10-CM | POA: Diagnosis present

## 2019-10-26 LAB — SARS CORONAVIRUS 2 (TAT 6-24 HRS): SARS Coronavirus 2: NEGATIVE

## 2019-10-26 MED ORDER — PROPRANOLOL HCL ER 80 MG PO CP24
80.0000 mg | ORAL_CAPSULE | Freq: Every day | ORAL | Status: DC
  Administered 2019-10-26 – 2019-10-28 (×3): 80 mg via ORAL
  Filled 2019-10-26 (×3): qty 1

## 2019-10-26 MED ORDER — PANTOPRAZOLE SODIUM 40 MG PO TBEC
40.0000 mg | DELAYED_RELEASE_TABLET | Freq: Every day | ORAL | Status: DC
  Administered 2019-10-26 – 2019-10-28 (×3): 40 mg via ORAL
  Filled 2019-10-26 (×3): qty 1

## 2019-10-26 MED ORDER — BISACODYL 10 MG RE SUPP: 10.0000 mg | Freq: Every day | RECTAL | Status: DC | PRN

## 2019-10-26 MED ORDER — CLONAZEPAM 0.5 MG PO TABS
0.5000 mg | ORAL_TABLET | Freq: Three times a day (TID) | ORAL | Status: DC
  Administered 2019-10-26 – 2019-10-28 (×8): 0.5 mg via ORAL
  Filled 2019-10-26 (×8): qty 1

## 2019-10-26 MED ORDER — TAMSULOSIN HCL 0.4 MG PO CAPS
0.4000 mg | ORAL_CAPSULE | Freq: Every day | ORAL | Status: DC
  Administered 2019-10-26 – 2019-10-27 (×3): 0.4 mg via ORAL
  Filled 2019-10-26 (×3): qty 1

## 2019-10-26 MED ORDER — POLYETHYLENE GLYCOL 3350 17 G PO PACK: 17.0000 g | PACK | Freq: Every day | ORAL | Status: DC | PRN

## 2019-10-26 MED ORDER — ACETAMINOPHEN 650 MG RE SUPP: 650.0000 mg | Freq: Four times a day (QID) | RECTAL | Status: DC | PRN

## 2019-10-26 MED ORDER — OFLOXACIN 0.3 % OP SOLN
1.0000 [drp] | Freq: Three times a day (TID) | OPHTHALMIC | Status: DC
  Administered 2019-10-26 – 2019-10-28 (×6): 1 [drp] via OPHTHALMIC

## 2019-10-26 MED ORDER — SODIUM CHLORIDE 0.9 % IV SOLN
INTRAVENOUS | Status: DC
  Administered 2019-10-26 – 2019-10-27 (×3): via INTRAVENOUS

## 2019-10-26 MED ORDER — ACETAMINOPHEN 500 MG PO TABS: 500.0000 mg | ORAL_TABLET | Freq: Four times a day (QID) | ORAL | Status: DC | PRN

## 2019-10-26 MED ORDER — LIDOCAINE HCL (PF) 1 % IJ SOLN
5.0000 mL | Freq: Once | INTRAMUSCULAR | Status: AC
  Administered 2019-10-26: 5 mL via INTRADERMAL
  Filled 2019-10-26: qty 5

## 2019-10-26 MED ORDER — DOCUSATE SODIUM 100 MG PO CAPS
100.0000 mg | ORAL_CAPSULE | Freq: Two times a day (BID) | ORAL | Status: DC
  Administered 2019-10-26 – 2019-10-28 (×5): 100 mg via ORAL
  Filled 2019-10-26 (×6): qty 1

## 2019-10-26 MED ORDER — PREDNISOLONE ACETATE 1 % OP SUSP
1.0000 [drp] | Freq: Every day | OPHTHALMIC | Status: DC
  Administered 2019-10-26 – 2019-10-28 (×3): 1 [drp] via OPHTHALMIC
  Filled 2019-10-26: qty 5

## 2019-10-26 MED ORDER — OFLOXACIN 0.3 % OP SOLN
1.0000 [drp] | Freq: Every day | OPHTHALMIC | Status: DC
  Administered 2019-10-26 – 2019-10-28 (×3): 1 [drp] via OPHTHALMIC
  Filled 2019-10-26: qty 5

## 2019-10-26 MED ORDER — TOPIRAMATE 25 MG PO TABS
50.0000 mg | ORAL_TABLET | Freq: Two times a day (BID) | ORAL | Status: DC
  Administered 2019-10-26 – 2019-10-28 (×6): 50 mg via ORAL
  Filled 2019-10-26 (×6): qty 2

## 2019-10-26 MED ORDER — POLYVINYL ALCOHOL 1.4 % OP SOLN: 1.0000 [drp] | OPHTHALMIC | Status: DC | PRN

## 2019-10-26 MED ORDER — ABACAVIR-DOLUTEGRAVIR-LAMIVUD 600-50-300 MG PO TABS
1.0000 | ORAL_TABLET | Freq: Every day | ORAL | Status: DC
  Administered 2019-10-26 – 2019-10-28 (×3): 1 via ORAL
  Filled 2019-10-26 (×3): qty 1

## 2019-10-26 MED ORDER — FENTANYL CITRATE (PF) 100 MCG/2ML IJ SOLN
50.0000 ug | Freq: Once | INTRAMUSCULAR | Status: AC
  Administered 2019-10-26: 50 ug via INTRAVENOUS
  Filled 2019-10-26: qty 2

## 2019-10-26 MED ORDER — ONDANSETRON HCL 4 MG PO TABS: 4.0000 mg | ORAL_TABLET | Freq: Four times a day (QID) | ORAL | Status: DC | PRN

## 2019-10-26 MED ORDER — ONDANSETRON HCL 4 MG/2ML IJ SOLN: 4.0000 mg | Freq: Four times a day (QID) | INTRAMUSCULAR | Status: DC | PRN

## 2019-10-26 MED ORDER — GABAPENTIN 300 MG PO CAPS
600.0000 mg | ORAL_CAPSULE | Freq: Three times a day (TID) | ORAL | Status: DC
  Administered 2019-10-26 – 2019-10-28 (×8): 600 mg via ORAL
  Filled 2019-10-26 (×8): qty 2

## 2019-10-26 MED ORDER — OXYCODONE HCL 5 MG PO TABS
5.0000 mg | ORAL_TABLET | ORAL | Status: DC | PRN
  Administered 2019-10-27 – 2019-10-28 (×4): 5 mg via ORAL
  Filled 2019-10-26 (×4): qty 1

## 2019-10-26 MED ORDER — ACETAMINOPHEN 325 MG PO TABS: 650.0000 mg | ORAL_TABLET | Freq: Four times a day (QID) | ORAL | Status: DC | PRN

## 2019-10-26 MED ORDER — PREDNISOLONE ACETATE 1 % OP SUSP
1.0000 [drp] | Freq: Three times a day (TID) | OPHTHALMIC | Status: DC
  Administered 2019-10-26 – 2019-10-28 (×6): 1 [drp] via OPHTHALMIC

## 2019-10-26 MED ORDER — HYDROXYZINE HCL 25 MG PO TABS: 50.0000 mg | ORAL_TABLET | Freq: Every day | ORAL | Status: DC | PRN

## 2019-10-26 MED ORDER — FLEET ENEMA 7-19 GM/118ML RE ENEM: 1.0000 | ENEMA | Freq: Once | RECTAL | Status: DC | PRN

## 2019-10-26 MED ORDER — VITAMIN B-12 1000 MCG PO TABS
5000.0000 ug | ORAL_TABLET | Freq: Every day | ORAL | Status: DC
  Administered 2019-10-26 – 2019-10-28 (×3): 5000 ug via ORAL
  Filled 2019-10-26 (×3): qty 5

## 2019-10-26 MED ORDER — TETANUS-DIPHTH-ACELL PERTUSSIS 5-2.5-18.5 LF-MCG/0.5 IM SUSP
0.5000 mL | Freq: Once | INTRAMUSCULAR | Status: AC
  Administered 2019-10-26: 0.5 mL via INTRAMUSCULAR
  Filled 2019-10-26: qty 0.5

## 2019-10-26 MED ORDER — BUSPIRONE HCL 10 MG PO TABS
10.0000 mg | ORAL_TABLET | Freq: Three times a day (TID) | ORAL | Status: DC
  Administered 2019-10-26 – 2019-10-28 (×8): 10 mg via ORAL
  Filled 2019-10-26 (×8): qty 1

## 2019-10-26 MED ORDER — EZETIMIBE 10 MG PO TABS
10.0000 mg | ORAL_TABLET | Freq: Every day | ORAL | Status: DC
  Administered 2019-10-26 – 2019-10-28 (×3): 10 mg via ORAL
  Filled 2019-10-26 (×3): qty 1

## 2019-10-26 MED ORDER — MIRABEGRON ER 25 MG PO TB24
50.0000 mg | ORAL_TABLET | Freq: Every evening | ORAL | Status: DC
  Administered 2019-10-26 – 2019-10-27 (×2): 50 mg via ORAL
  Filled 2019-10-26: qty 1
  Filled 2019-10-26: qty 2

## 2019-10-26 NOTE — ED Provider Notes (Signed)
  LACERATION REPAIR Performed by: Larene Pickett Authorized by: Larene Pickett Consent: Verbal consent obtained. Risks and benefits: risks, benefits and alternatives were discussed Consent given by: patient Patient identity confirmed: provided demographic data Prepped and Draped in normal sterile fashion Wound explored  Laceration Location: left upper lip  Laceration Length: 2cm, macerated  No Foreign Bodies seen or palpated  Anesthesia: local infiltration  Local anesthetic: lidocaine 1% without epinephrine  Anesthetic total: 3 ml  Irrigation method: syringe Amount of cleaning: standard  Skin closure: 5-0 vicryl rapide  Number of sutures: 3  Technique: simple interrupted  Patient tolerance: Patient tolerated the procedure well with no immediate complications.    Larene Pickett, PA-C 10/26/19 0142    Ward, Delice Bison, DO 10/26/19 [phone]

## 2019-10-26 NOTE — ED Notes (Signed)
Neurosurgery PA at bedside.
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
## 2019-10-26 NOTE — ED Notes (Signed)
Family at bedside. 

## 2019-10-26 NOTE — ED Notes (Signed)
ED Provider at bedside. 

## 2019-10-26 NOTE — Progress Notes (Signed)
Patient ID: Brian Malone, male   DOB: [birth year], 68 y.o.   MRN: [id number] BP 126/83   Pulse 81   Temp 97.6 F (36.4 C) (Axillary) Comment (Src): axillary temp performed due to swelling/lacs to lips  Resp 12   SpO2 98%  Mr. Shrock is alert, oriented x 4, speech mildly dysarthric secondary to facial swelling Moving all extremities  Repeat head CT at 1424 which I was asked to review at 2200 showed no change. He has a friend he can stay with or stay with him. He is ok for discharge.
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
## 2019-10-26 NOTE — ED Notes (Signed)
Patient transported to CT 
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
## 2019-10-27 NOTE — Care Management Obs Status (Signed)
[place] NOTIFICATION   Patient Details  Name: Brian Malone MRN: [id number] Date of Birth: 1951/10/12   Medicare Observation Status Notification Given:  Yes    Carles Collet, RN 10/27/2019, 12:37 PM
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
## 2019-10-28 DIAGNOSIS — Z21 Asymptomatic human immunodeficiency virus [HIV] infection status: Secondary | ICD-10-CM | POA: Diagnosis present

## 2019-10-28 DIAGNOSIS — S065X9A Traumatic subdural hemorrhage with loss of consciousness of unspecified duration, initial encounter: Secondary | ICD-10-CM | POA: Diagnosis present

## 2019-10-28 DIAGNOSIS — S065XAA Traumatic subdural hemorrhage with loss of consciousness status unknown, initial encounter: Secondary | ICD-10-CM

## 2019-10-28 DIAGNOSIS — I10 Essential (primary) hypertension: Secondary | ICD-10-CM | POA: Diagnosis present

## 2019-10-28 DIAGNOSIS — F329 Major depressive disorder, single episode, unspecified: Secondary | ICD-10-CM | POA: Diagnosis present

## 2019-10-28 DIAGNOSIS — S01511A Laceration without foreign body of lip, initial encounter: Secondary | ICD-10-CM | POA: Diagnosis present

## 2019-10-28 DIAGNOSIS — Z801 Family history of malignant neoplasm of trachea, bronchus and lung: Secondary | ICD-10-CM | POA: Diagnosis not present

## 2019-10-28 DIAGNOSIS — G43909 Migraine, unspecified, not intractable, without status migrainosus: Secondary | ICD-10-CM | POA: Diagnosis present

## 2019-10-28 DIAGNOSIS — R519 Headache, unspecified: Secondary | ICD-10-CM | POA: Diagnosis present

## 2019-10-28 DIAGNOSIS — M4802 Spinal stenosis, cervical region: Secondary | ICD-10-CM | POA: Diagnosis present

## 2019-10-28 DIAGNOSIS — Z7984 Long term (current) use of oral hypoglycemic drugs: Secondary | ICD-10-CM | POA: Diagnosis not present

## 2019-10-28 DIAGNOSIS — G629 Polyneuropathy, unspecified: Secondary | ICD-10-CM | POA: Diagnosis present

## 2019-10-28 DIAGNOSIS — S065X0A Traumatic subdural hemorrhage without loss of consciousness, initial encounter: Secondary | ICD-10-CM | POA: Diagnosis present

## 2019-10-28 DIAGNOSIS — Z885 Allergy status to narcotic agent status: Secondary | ICD-10-CM | POA: Diagnosis not present

## 2019-10-28 DIAGNOSIS — N400 Enlarged prostate without lower urinary tract symptoms: Secondary | ICD-10-CM | POA: Diagnosis present

## 2019-10-28 DIAGNOSIS — M461 Sacroiliitis, not elsewhere classified: Secondary | ICD-10-CM | POA: Diagnosis present

## 2019-10-28 DIAGNOSIS — Z888 Allergy status to other drugs, medicaments and biological substances status: Secondary | ICD-10-CM | POA: Diagnosis not present

## 2019-10-28 DIAGNOSIS — Z23 Encounter for immunization: Secondary | ICD-10-CM | POA: Diagnosis not present

## 2019-10-28 DIAGNOSIS — M47816 Spondylosis without myelopathy or radiculopathy, lumbar region: Secondary | ICD-10-CM | POA: Diagnosis present

## 2019-10-28 DIAGNOSIS — F419 Anxiety disorder, unspecified: Secondary | ICD-10-CM | POA: Diagnosis present

## 2019-10-28 DIAGNOSIS — Z20828 Contact with and (suspected) exposure to other viral communicable diseases: Secondary | ICD-10-CM | POA: Diagnosis present

## 2019-10-28 DIAGNOSIS — Z8249 Family history of ischemic heart disease and other diseases of the circulatory system: Secondary | ICD-10-CM | POA: Diagnosis not present

## 2019-10-28 HISTORY — DX: Traumatic subdural hemorrhage with loss of consciousness status unknown, initial encounter: S06.5XAA

## 2019-10-28 HISTORY — DX: Traumatic subdural hemorrhage with loss of consciousness of unspecified duration, initial encounter: S06.5X9A
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
## 2019-11-02 ENCOUNTER — Emergency Department (HOSPITAL_BASED_OUTPATIENT_CLINIC_OR_DEPARTMENT_OTHER)
Admission: EM | Admit: 2019-11-02 | Discharge: 2019-11-02 | Disposition: A | Discharge status: Home / Self Care | Payer: Medicare Other | Attending: Emergency Medicine | Admitting: Emergency Medicine

## 2019-11-02 ENCOUNTER — Emergency Department (HOSPITAL_BASED_OUTPATIENT_CLINIC_OR_DEPARTMENT_OTHER): Payer: Medicare Other

## 2019-11-02 ENCOUNTER — Other Ambulatory Visit: Payer: Self-pay

## 2019-11-02 ENCOUNTER — Encounter (HOSPITAL_BASED_OUTPATIENT_CLINIC_OR_DEPARTMENT_OTHER): Payer: Self-pay | Admitting: *Deleted

## 2019-11-02 DIAGNOSIS — I10 Essential (primary) hypertension: Secondary | ICD-10-CM | POA: Insufficient documentation

## 2019-11-02 DIAGNOSIS — Z21 Asymptomatic human immunodeficiency virus [HIV] infection status: Secondary | ICD-10-CM | POA: Diagnosis not present

## 2019-11-02 DIAGNOSIS — R519 Headache, unspecified: Secondary | ICD-10-CM | POA: Diagnosis not present

## 2019-11-02 DIAGNOSIS — Z79899 Other long term (current) drug therapy: Secondary | ICD-10-CM | POA: Diagnosis not present

## 2019-11-02 DIAGNOSIS — R42 Dizziness and giddiness: Secondary | ICD-10-CM | POA: Diagnosis present

## 2019-11-02 DIAGNOSIS — E119 Type 2 diabetes mellitus without complications: Secondary | ICD-10-CM | POA: Insufficient documentation

## 2019-11-02 DIAGNOSIS — F0781 Postconcussional syndrome: Secondary | ICD-10-CM | POA: Insufficient documentation
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
## 2019-11-02 NOTE — ED Notes (Signed)
Patient transported to CT 

## 2019-11-02 NOTE — Discharge Instructions (Addendum)
Make an appointment to follow-up with neurology.  Also keep your appointment to follow-up with neurosurgery as scheduled in a month.  Today's head CT shows significant improvement from the initial injury.  But your symptoms are certainly consistent with a postconcussive syndrome.  Sometimes this can take weeks to months to heal.  So would recommend following up with neurology if you are still having difficulty they will schedule an MRI for a baseline measurement.  Today's head CT was very reassuring.
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
## 2019-11-05 ENCOUNTER — Encounter: Payer: Self-pay | Admitting: Neurology
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
## 2019-11-22 ENCOUNTER — Encounter (HOSPITAL_BASED_OUTPATIENT_CLINIC_OR_DEPARTMENT_OTHER): Payer: Self-pay | Admitting: *Deleted

## 2019-11-22 ENCOUNTER — Telehealth (INDEPENDENT_AMBULATORY_CARE_PROVIDER_SITE_OTHER): Payer: Medicare Other | Admitting: Neurology

## 2019-11-22 ENCOUNTER — Other Ambulatory Visit: Payer: Self-pay

## 2019-11-22 ENCOUNTER — Emergency Department (HOSPITAL_BASED_OUTPATIENT_CLINIC_OR_DEPARTMENT_OTHER): Payer: Medicare Other

## 2019-11-22 ENCOUNTER — Observation Stay (HOSPITAL_BASED_OUTPATIENT_CLINIC_OR_DEPARTMENT_OTHER)
Admission: EM | Admit: 2019-11-22 | Discharge: 2019-11-24 | Disposition: A | Discharge status: Home / Self Care | Payer: Medicare Other | Attending: Internal Medicine | Admitting: Internal Medicine

## 2019-11-22 ENCOUNTER — Encounter: Payer: Self-pay | Admitting: Neurology

## 2019-11-22 VITALS — Ht 70.0 in | Wt 190.0 lb

## 2019-11-22 DIAGNOSIS — N401 Enlarged prostate with lower urinary tract symptoms: Secondary | ICD-10-CM | POA: Diagnosis not present

## 2019-11-22 DIAGNOSIS — S065XAA Traumatic subdural hemorrhage with loss of consciousness status unknown, initial encounter: Secondary | ICD-10-CM

## 2019-11-22 DIAGNOSIS — R9431 Abnormal electrocardiogram [ECG] [EKG]: Secondary | ICD-10-CM

## 2019-11-22 DIAGNOSIS — E1121 Type 2 diabetes mellitus with diabetic nephropathy: Secondary | ICD-10-CM | POA: Diagnosis not present

## 2019-11-22 DIAGNOSIS — R0789 Other chest pain: Principal | ICD-10-CM

## 2019-11-22 DIAGNOSIS — E1122 Type 2 diabetes mellitus with diabetic chronic kidney disease: Secondary | ICD-10-CM

## 2019-11-22 DIAGNOSIS — Z20828 Contact with and (suspected) exposure to other viral communicable diseases: Secondary | ICD-10-CM | POA: Insufficient documentation

## 2019-11-22 DIAGNOSIS — Z79899 Other long term (current) drug therapy: Secondary | ICD-10-CM | POA: Diagnosis not present

## 2019-11-22 DIAGNOSIS — Z21 Asymptomatic human immunodeficiency virus [HIV] infection status: Secondary | ICD-10-CM

## 2019-11-22 DIAGNOSIS — B20 Human immunodeficiency virus [HIV] disease: Secondary | ICD-10-CM | POA: Diagnosis present

## 2019-11-22 DIAGNOSIS — E119 Type 2 diabetes mellitus without complications: Secondary | ICD-10-CM

## 2019-11-22 DIAGNOSIS — Z7984 Long term (current) use of oral hypoglycemic drugs: Secondary | ICD-10-CM | POA: Insufficient documentation

## 2019-11-22 DIAGNOSIS — S065X9A Traumatic subdural hemorrhage with loss of consciousness of unspecified duration, initial encounter: Secondary | ICD-10-CM

## 2019-11-22 DIAGNOSIS — F0781 Postconcussional syndrome: Secondary | ICD-10-CM

## 2019-11-22 DIAGNOSIS — F329 Major depressive disorder, single episode, unspecified: Secondary | ICD-10-CM | POA: Insufficient documentation

## 2019-11-22 DIAGNOSIS — G43709 Chronic migraine without aura, not intractable, without status migrainosus: Secondary | ICD-10-CM

## 2019-11-22 DIAGNOSIS — F32A Depression, unspecified: Secondary | ICD-10-CM | POA: Diagnosis present

## 2019-11-22 DIAGNOSIS — N1832 Chronic kidney disease, stage 3b: Secondary | ICD-10-CM

## 2019-11-22 DIAGNOSIS — I10 Essential (primary) hypertension: Secondary | ICD-10-CM | POA: Diagnosis not present

## 2019-11-22 DIAGNOSIS — F1910 Other psychoactive substance abuse, uncomplicated: Secondary | ICD-10-CM | POA: Diagnosis not present

## 2019-11-22 DIAGNOSIS — G959 Disease of spinal cord, unspecified: Secondary | ICD-10-CM

## 2019-11-22 DIAGNOSIS — G43109 Migraine with aura, not intractable, without status migrainosus: Secondary | ICD-10-CM

## 2019-11-22 DIAGNOSIS — R079 Chest pain, unspecified: Secondary | ICD-10-CM | POA: Diagnosis present

## 2019-11-22 LAB — GLUCOSE, CAPILLARY
Glucose-Capillary: 89 mg/dL (ref 70–99)
Glucose-Capillary: 96 mg/dL (ref 70–99)

## 2019-11-22 LAB — CBC WITH DIFFERENTIAL/PLATELET
Abs Immature Granulocytes: 0.02 10*3/uL (ref 0.00–0.07)
Basophils Absolute: 0 10*3/uL (ref 0.0–0.1)
Basophils Relative: 1 %
Eosinophils Absolute: 0.1 10*3/uL (ref 0.0–0.5)
Eosinophils Relative: 2 %
HCT: 43.2 % (ref 39.0–52.0)
Hemoglobin: 14.2 g/dL (ref 13.0–17.0)
Immature Granulocytes: 0 %
Lymphocytes Relative: 17 %
Lymphs Abs: 0.9 10*3/uL (ref 0.7–4.0)
MCH: 33.1 pg (ref 26.0–34.0)
MCHC: 32.9 g/dL (ref 30.0–36.0)
MCV: 100.7 fL — ABNORMAL HIGH (ref 80.0–100.0)
Monocytes Absolute: 0.6 10*3/uL (ref 0.1–1.0)
Monocytes Relative: 10 %
Neutro Abs: 3.9 10*3/uL (ref 1.7–7.7)
Neutrophils Relative %: 70 %
Platelets: 151 10*3/uL (ref 150–400)
RBC: 4.29 MIL/uL (ref 4.22–5.81)
RDW: 13.2 % (ref 11.5–15.5)
WBC: 5.5 10*3/uL (ref 4.0–10.5)
nRBC: 0 % (ref 0.0–0.2)

## 2019-11-22 LAB — COMPREHENSIVE METABOLIC PANEL
ALT: 15 U/L (ref 0–44)
AST: 15 U/L (ref 15–41)
Albumin: 4.1 g/dL (ref 3.5–5.0)
Alkaline Phosphatase: 114 U/L (ref 38–126)
Anion gap: 7 (ref 5–15)
BUN: 20 mg/dL (ref 8–23)
CO2: 22 mmol/L (ref 22–32)
Calcium: 9.5 mg/dL (ref 8.9–10.3)
Chloride: 111 mmol/L (ref 98–111)
Creatinine, Ser: 1.35 mg/dL — ABNORMAL HIGH (ref 0.61–1.24)
GFR calc Af Amer: >60 mL/min (ref >60)
GFR calc non Af Amer: 55 mL/min — ABNORMAL LOW (ref >60)
Glucose, Bld: 123 mg/dL — ABNORMAL HIGH (ref 70–99)
Potassium: 3.7 mmol/L (ref 3.5–5.1)
Sodium: 140 mmol/L (ref 135–145)
Total Bilirubin: 0.9 mg/dL (ref 0.3–1.2)
Total Protein: 7.9 g/dL (ref 6.5–8.1)

## 2019-11-22 LAB — CREATININE, SERUM
Creatinine, Ser: 1.25 mg/dL — ABNORMAL HIGH (ref 0.61–1.24)
GFR calc Af Amer: >60 mL/min (ref >60)
GFR calc non Af Amer: >60 mL/min (ref >60)

## 2019-11-22 LAB — CBC
HCT: 38.5 % — ABNORMAL LOW (ref 39.0–52.0)
Hemoglobin: 13.1 g/dL (ref 13.0–17.0)
MCH: 34.1 pg — ABNORMAL HIGH (ref 26.0–34.0)
MCHC: 34 g/dL (ref 30.0–36.0)
MCV: 100.3 fL — ABNORMAL HIGH (ref 80.0–100.0)
Platelets: 163 10*3/uL (ref 150–400)
RBC: 3.84 MIL/uL — ABNORMAL LOW (ref 4.22–5.81)
RDW: 13.2 % (ref 11.5–15.5)
WBC: 5.3 10*3/uL (ref 4.0–10.5)
nRBC: 0 % (ref 0.0–0.2)

## 2019-11-22 LAB — CBG MONITORING, ED
Glucose-Capillary: 69 mg/dL — ABNORMAL LOW (ref 70–99)
Glucose-Capillary: 53 mg/dL — ABNORMAL LOW (ref 70–99)

## 2019-11-22 LAB — D-DIMER, QUANTITATIVE: D-Dimer, Quant: 2.83 ug/mL-FEU — ABNORMAL HIGH (ref 0.00–0.50)

## 2019-11-22 LAB — TROPONIN I (HIGH SENSITIVITY)
Troponin I (High Sensitivity): 3 ng/L (ref <18)
Troponin I (High Sensitivity): 3 ng/L (ref <18)

## 2019-11-22 LAB — LIPID PANEL
Cholesterol: 149 mg/dL (ref 0–200)
HDL: 25 mg/dL — ABNORMAL LOW (ref >40)
LDL Cholesterol: 47 mg/dL (ref 0–99)
Total CHOL/HDL Ratio: 6 RATIO
Triglycerides: 387 mg/dL — ABNORMAL HIGH (ref <150)
VLDL: 77 mg/dL — ABNORMAL HIGH (ref 0–40)

## 2019-11-22 LAB — HEMOGLOBIN A1C
Hgb A1c MFr Bld: 6.8 % — ABNORMAL HIGH (ref 4.8–5.6)
Mean Plasma Glucose: 148.46 mg/dL

## 2019-11-22 MED ORDER — ACETAMINOPHEN 325 MG PO TABS
650.0000 mg | ORAL_TABLET | ORAL | Status: DC | PRN
  Administered 2019-11-22 – 2019-11-23 (×2): 650 mg via ORAL
  Filled 2019-11-22 (×2): qty 2

## 2019-11-22 MED ORDER — DEXTROSE 50 % IV SOLN: 50.0000 mL | Freq: Once | INTRAVENOUS | Status: DC

## 2019-11-22 MED ORDER — INSULIN ASPART 100 UNIT/ML ~~LOC~~ SOLN: 0.0000 [IU] | Freq: Every day | SUBCUTANEOUS | Status: DC

## 2019-11-22 MED ORDER — INSULIN ASPART 100 UNIT/ML ~~LOC~~ SOLN: 0.0000 [IU] | Freq: Three times a day (TID) | SUBCUTANEOUS | Status: DC

## 2019-11-22 MED ORDER — IOHEXOL 350 MG/ML SOLN
100.0000 mL | Freq: Once | INTRAVENOUS | Status: AC
  Administered 2019-11-22: 100 mL via INTRAVENOUS

## 2019-11-22 MED ORDER — ENOXAPARIN SODIUM 40 MG/0.4ML ~~LOC~~ SOLN
40.0000 mg | SUBCUTANEOUS | Status: DC
  Administered 2019-11-22 – 2019-11-23 (×2): 40 mg via SUBCUTANEOUS
  Filled 2019-11-22 (×2): qty 0.4

## 2019-11-22 MED ORDER — AIMOVIG 70 MG/ML ~~LOC~~ SOAJ: 70.0000 mg | SUBCUTANEOUS | 11 refills | Status: DC

## 2019-11-22 MED ORDER — NITROGLYCERIN 2 % TD OINT
1.0000 [in_us] | TOPICAL_OINTMENT | Freq: Once | TRANSDERMAL | Status: AC
  Administered 2019-11-22: 1 [in_us] via TOPICAL
  Filled 2019-11-22: qty 1

## 2019-11-22 MED ORDER — ONDANSETRON HCL 4 MG/2ML IJ SOLN: 4.0000 mg | Freq: Four times a day (QID) | INTRAMUSCULAR | Status: DC | PRN

## 2019-11-22 MED ORDER — SODIUM CHLORIDE 0.9 % IV SOLN
INTRAVENOUS | Status: DC
  Administered 2019-11-22 – 2019-11-23 (×2): via INTRAVENOUS

## 2019-11-22 MED ORDER — ASPIRIN 81 MG PO CHEW
324.0000 mg | CHEWABLE_TABLET | Freq: Once | ORAL | Status: AC
  Administered 2019-11-22: 324 mg via ORAL
  Filled 2019-11-22: qty 4

## 2019-11-22 NOTE — ED Notes (Signed)
ED Provider at bedside, alert, no c/o's
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
## 2019-11-22 NOTE — ED Notes (Signed)
Given crackers with peanut butter and coke

## 2019-11-22 NOTE — ED Notes (Signed)
Given OJ, heating frozen dinner.

## 2019-11-22 NOTE — ED Notes (Signed)
Report given to [facility] with [employer] 

## 2019-11-22 NOTE — ED Notes (Signed)
Called to give report to receiving nurse, unable, Ms. Butch Penny, Network engineer, will inform the nurse, and instructed to call this nurse, when able to report

## 2019-11-22 NOTE — ED Notes (Signed)
Given mac and cheese dinner

## 2019-11-22 NOTE — ED Notes (Signed)
Called to give report to receiving nurse, in report, will call back
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
## 2019-11-22 NOTE — H&P (Signed)
History and Physical   Brian Malone [id number] DOB: 01/13/1951 DOA: 11/22/2019  Referring MD/NP/PA: Monico Blitz, PA  PCP: [name], [name], MD   Outpatient Specialists: Infectious disease at [facility]  Patient coming from: [facility]  Chief Complaint: Chest pain  HPI: Brian Malone is a 68 y.o. male with medical history significant of diabetes, hypertension, HIV disease, hyperlipidemia, chronic kidney disease stage III, polysubstance abuse including opiates and methamphetamine.  Patient goes from [facility] for his care.  He went to [facility] today complaining of 5 days of chest pain shortness of breath which is substernal.  Pain is on and off more squeezing.  He only had an episode of passing out 4 days ago where EMS were called.  He did not recollect what happened the last thing he remembered was EMS in his house.  He was given Narcan and patient recovered.  He denied drug abuse but is urine drug screen back in November showed positive opiates and methamphetamines.  Today he denied taking any medications.  His chest pain is 102 so nitroglycerin in the ER.  He has abnormal EKG with no old 1 to compare.  Due to risk factors patient will be admitted for rule out MI..  ED Course: Temperature is 98.4 blood pressure 157/94 pulse 8820 O2 sat 95% room air.  CBC and chemistry appear to be reliably within normal.  Initial troponin is 3.  D-dimer is 2.83.  Chest x-ray and CT angio of the chest were negative.  Patient admitted with suspected angina.  COVID-19 so far negative.  Review of Systems: As per HPI otherwise 10 point review of systems negative.    Past Medical History:  Diagnosis Date   Anxiety    Diabetes mellitus without complication ([place])    HIV (human immunodeficiency virus infection) ([place])    Hypertension    Renal disorder     Past Surgical History:  Procedure Laterality Date   ANKLE ARTHROSCOPY      APPENDECTOMY     BACK SURGERY     FUSION   THORACOTOMY Right 2008  ?   TONSILLECTOMY       reports that he has never smoked. He has never used smokeless tobacco. He reports previous alcohol use. He reports that he does not use drugs.  Allergies  Allergen Reactions   Adhesive [Tape] Other (See Comments)    "Tape will take off my skin, as will Band-Aids"   Cyclobenzaprine Other (See Comments)    Hallucinations   Cymbalta [Duloxetine Hcl] Other (See Comments)    Makes PTSD- induced nightmares more vivid   Morphine Itching and Other (See Comments)    Hallucinations, aggression, and makes patient altered also      Family History  Problem Relation Age of Onset   CAD Mother    Lung cancer Mother    CAD Father    Lung cancer Father    CAD Brother      Prior to Admission medications   Medication Sig Start Date End Date Taking? Authorizing Provider  acetaminophen (TYLENOL) 500 MG tablet Take 500-1,000 mg by mouth every 6 (six) hours as needed for mild pain or headache.    [provider]  Ascorbic Acid (VITAMIN C) 1000 MG tablet Take 1,000 mg by mouth every other day.    [provider]  busPIRone (BUSPAR) 10 MG tablet Take 10 mg by mouth 3 (three) times daily.  01/22/19  [provider]  clonazePAM (KLONOPIN) 0.5 MG tablet Take 0.5 mg by mouth 3 (three) times daily. 10/01/19   [provider]  colchicine 0.6 MG tablet Take 0.6 mg by mouth daily as needed (as directed for gout flares).    [provider]  Cyanocobalamin (VITAMIN B-12) 5000 MCG TBDP Take 5,000 mcg by mouth daily.     [provider]  cyclobenzaprine (FLEXERIL) 10 MG tablet Take 1 tablet (10 mg total) by mouth 2 (two) times daily as needed for muscle spasms. 07/07/19   Gareth Morgan, MD  Erenumab-aooe (AIMOVIG) 70 MG/ML SOAJ Inject 70 mg into the skin every 30 (thirty) days. 11/22/19   Pieter Partridge, DO  ezetimibe (ZETIA) 10 MG tablet Take 10 mg by mouth  daily.    [provider]  gabapentin (NEURONTIN) 300 MG capsule Take 1 capsule (300 mg total) by mouth 3 (three) times daily. Patient taking differently: Take 600 mg by mouth 3 (three) times daily.  02/10/19   Carmin Muskrat, MD  hydroxypropyl methylcellulose / hypromellose (ISOPTO TEARS / GONIOVISC) 2.5 % ophthalmic solution Place 1-2 drops into both eyes as needed for dry eyes.    [provider]  mirabegron ER (MYRBETRIQ) 50 MG TB24 tablet Take 50 mg by mouth every evening.     [provider]  pantoprazole (PROTONIX) 40 MG tablet Take 40 mg by mouth daily before breakfast.  02/12/19   [provider]  propranolol ER (INDERAL LA) 80 MG 24 hr capsule Take 80 mg by mouth daily.  02/12/19   [provider]  rizatriptan (MAXALT) 10 MG tablet Take 10 mg by mouth as needed for migraine.  03/21/19   [provider]  sitaGLIPtin (JANUVIA) 100 MG tablet Take 100 mg by mouth daily.    [provider]  tamsulosin (FLOMAX) 0.4 MG CAPS capsule Take 0.4 mg by mouth at bedtime.  02/12/19   [provider]  tiZANidine (ZANAFLEX) 4 MG tablet Take 4 mg by mouth at bedtime.  03/08/19   [provider]  topiramate (TOPAMAX) 50 MG tablet Take 50 mg by mouth 2 (two) times daily.  03/21/19   [provider]  TRIUMEQ 600-50-300 MG tablet Take 1 tablet by mouth daily.  02/12/19   [provider]  valACYclovir (VALTREX) 1000 MG tablet Take 1,000 mg by mouth 3 (three) times daily as needed (as directed for trigeminal neuralgia flares). Trig neuralgia flare 10/16/19   [provider]  Vitamin D, Ergocalciferol, (DRISDOL) 1.25 MG (50000 UT) CAPS capsule Take 50,000 Units by mouth every Thursday.  03/21/19   [provider]    Physical Exam: Vitals:   11/22/19 1700 11/22/19 1745 11/22/19 1800 11/22/19 2023  BP: (!) 144/95 (!) 145/84 (!) 145/95 135/82  Pulse: 77 85 88 74  Resp: 16 14 15 16   Temp:    98.4 F (36.9  C)  TempSrc:    Oral  SpO2: 100% 100% 100% 100%  Weight:    84.1 kg  Height:    6' (1.829 m)      Constitutional: NAD, calm, having headache so a little worried Vitals:   11/22/19 1700 11/22/19 1745 11/22/19 1800 11/22/19 2023  BP: (!) 144/95 (!) 145/84 (!) 145/95 135/82  Pulse: 77 85 88 74  Resp: 16 14 15 16   Temp:    98.4 F (36.9 C)  TempSrc:    Oral  SpO2: 100% 100% 100% 100%  Weight:    84.1 kg  Height:  6' (1.829 m)   Eyes: PERRL, lids and conjunctivae normal ENMT: Mucous membranes are moist. Posterior pharynx clear of any exudate or lesions.Normal dentition.  Neck: normal, supple, no masses, no thyromegaly Respiratory: clear to auscultation bilaterally, no wheezing, no crackles. Normal respiratory effort. No accessory muscle use.  Cardiovascular: Regular rate and rhythm, no murmurs / rubs / gallops. No extremity edema. 2+ pedal pulses. No carotid bruits.  Abdomen: no tenderness, no masses palpated. No hepatosplenomegaly. Bowel sounds positive.  Musculoskeletal: no clubbing / cyanosis. No joint deformity upper and lower extremities. Good ROM, no contractures. Normal muscle tone.  Skin: no rashes, lesions, ulcers. No induration Neurologic: CN 2-12 grossly intact. Sensation intact, DTR normal. Strength 5/5 in all 4.  Psychiatric: Normal judgment and insight. Alert and oriented x 3.  Anxious    Labs on Admission: I have personally reviewed following labs and imaging studies  CBC: Recent Labs  Lab 11/22/19 1545  WBC 5.5  NEUTROABS 3.9  HGB 14.2  HCT 43.2  MCV 100.7*  PLT 123XX123   Basic Metabolic Panel: Recent Labs  Lab 11/22/19 1544  NA 140  K 3.7  CL 111  CO2 22  GLUCOSE 123*  BUN 20  CREATININE 1.35*  CALCIUM 9.5   GFR: Estimated Creatinine Clearance: 59.9 mL/min (A) (by C-G formula based on SCr of 1.35 mg/dL (H)). Liver Function Tests: Recent Labs  Lab 11/22/19 1544  AST 15  ALT 15  ALKPHOS 114  BILITOT 0.9  PROT 7.9  ALBUMIN 4.1   No  results for input(s): LIPASE, AMYLASE in the last 168 hours. No results for input(s): AMMONIA in the last 168 hours. Coagulation Profile: No results for input(s): INR, PROTIME in the last 168 hours. Cardiac Enzymes: No results for input(s): CKTOTAL, CKMB, CKMBINDEX, TROPONINI in the last 168 hours. BNP (last 3 results) No results for input(s): PROBNP in the last 8760 hours. HbA1C: No results for input(s): HGBA1C in the last 72 hours. CBG: Recent Labs  Lab 11/22/19 1642 11/22/19 1750 11/22/19 2026  GLUCAP 69* 53* 89   Lipid Profile: No results for input(s): CHOL, HDL, LDLCALC, TRIG, CHOLHDL, LDLDIRECT in the last 72 hours. Thyroid Function Tests: No results for input(s): TSH, T4TOTAL, FREET4, T3FREE, THYROIDAB in the last 72 hours. Anemia Panel: No results for input(s): VITAMINB12, FOLATE, FERRITIN, TIBC, IRON, RETICCTPCT in the last 72 hours. Urine analysis: No results found for: COLORURINE, APPEARANCEUR, LABSPEC, PHURINE, GLUCOSEU, HGBUR, BILIRUBINUR, KETONESUR, PROTEINUR, UROBILINOGEN, NITRITE, LEUKOCYTESUR Sepsis Labs: @LABRCNTIP (procalcitonin:4,lacticidven:4) )No results found for this or any previous visit (from the past 240 hour(s)).   Radiological Exams on Admission: Dg Chest 2 View  Result Date: 11/22/2019 CLINICAL DATA:  Chest pain for 3 days EXAM: CHEST - 2 VIEW COMPARISON:  10/25/2019 FINDINGS: The heart size and mediastinal contours are within normal limits. Both lungs are clear. Hardware in the cervical spine. IMPRESSION: No active cardiopulmonary disease. Electronically Signed   By: Donavan Foil M.D.   On: 11/22/2019 16:32   Ct Angio Chest Pe W And/or Wo Contrast  Result Date: 11/22/2019 CLINICAL DATA:  68 year old male with worsening chest pain. EXAM: CT ANGIOGRAPHY CHEST WITH CONTRAST TECHNIQUE: Multidetector CT imaging of the chest was performed using the standard protocol during bolus administration of intravenous contrast. Multiplanar CT image reconstructions  and MIPs were obtained to evaluate the vascular anatomy. CONTRAST:  121mL OMNIPAQUE IOHEXOL 350 MG/ML SOLN COMPARISON:  Chest CT dated 04/15/2019. FINDINGS: Cardiovascular: There is no cardiomegaly or pericardial effusion. Coronary vascular calcification primarily involving the  LAD and left circumflex artery. Mild atherosclerotic calcification of the aortic arch. No aneurysmal dilatation or dissection. The visualized origins of the great vessels of the aortic arch appear patent. There is no CT evidence of pulmonary embolism. Mediastinum/Nodes: No hilar or mediastinal adenopathy. The esophagus is grossly unremarkable. No mediastinal fluid collection. Lungs/Pleura: Probable trace left pleural effusion versus less likely minimal pleural thickening. Minimal hazy density in the left lower lobe and left lung base, likely atelectasis. Atypical infection is less likely but not excluded. Clinical correlation is recommended. There is no focal consolidation or pneumothorax. The central airways are patent. Upper Abdomen: No acute abnormality. Musculoskeletal: No chest wall abnormality. No acute or significant osseous findings. Review of the MIP images confirms the above findings. IMPRESSION: 1. No CT evidence of pulmonary embolism. 2. Probable trace left pleural effusion. 3. Faint left lower lobe hazy densities, likely atelectasis. Developing atypical infection is less likely. Clinical correlation is recommended. No focal consolidation. Electronically Signed   By: Anner Crete M.D.   On: 11/22/2019 17:31    EKG: Independently reviewed.  It showed normal sinus rhythm with a rate of 83, normal intervals, mild ST depression in lateral leads.   Assessment/Plan Principal Problem:   Chest pain Active Problems:   Diabetes (HCC)   HIV (human immunodeficiency virus infection) ([place])   Benign prostatic hyperplasia (BPH) with straining on urination   Depression   Essential hypertension     #1 chest pain: Appears like  angina.  Patient has significant risk factors for coronary artery disease.  We will admit him for observation.  Cycle enzymes.  Repeat EKG in the morning.  Get echocardiogram.  Patient may require a stress test probably is patient.  If everything is negative.  #2 diabetes: Sliding scale insulin regimen.  Continue monitoring  #3 hypertension: Continue home blood pressure medications and monitor.  #4 HIV disease: Currently patient admitted.  Defer to his PCP and infectious disease further treatment.  #5  Polysubstance abuse history: Patient denies recent use of substances.  Check urine drug screen.   DVT prophylaxis: Lovenox Code Status: Full code Family Communication: No family at bedside Disposition Plan: Home Consults called: None but cardiology consult if he rules in Admission status: Observation  Severity of Illness: The appropriate patient status for this patient is OBSERVATION. Observation status is judged to be reasonable and necessary in order to provide the required intensity of service to ensure the patient's safety. The patient's presenting symptoms, physical exam findings, and initial radiographic and laboratory data in the context of their medical condition is felt to place them at decreased risk for further clinical deterioration. Furthermore, it is anticipated that the patient will be medically stable for discharge from the hospital within 2 midnights of admission. The following factors support the patient status of observation.   " The patient's presenting symptoms include chest pain. " The physical exam findings include anxious but no significant findings. " The initial radiographic and laboratory data are abnormal EKG.     Barbette Merino MD [facility] Pager [phone]  If 7PM-7AM, please contact night-coverage [email or web address] Password [facility]  11/22/2019, 8:46 PM

## 2019-11-23 ENCOUNTER — Observation Stay (HOSPITAL_COMMUNITY): Payer: Medicare Other

## 2019-11-23 ENCOUNTER — Observation Stay (HOSPITAL_BASED_OUTPATIENT_CLINIC_OR_DEPARTMENT_OTHER): Payer: Medicare Other

## 2019-11-23 DIAGNOSIS — R079 Chest pain, unspecified: Secondary | ICD-10-CM

## 2019-11-23 DIAGNOSIS — I351 Nonrheumatic aortic (valve) insufficiency: Secondary | ICD-10-CM | POA: Diagnosis not present

## 2019-11-23 DIAGNOSIS — I2510 Atherosclerotic heart disease of native coronary artery without angina pectoris: Secondary | ICD-10-CM | POA: Diagnosis not present

## 2019-11-23 DIAGNOSIS — R0789 Other chest pain: Secondary | ICD-10-CM | POA: Diagnosis not present

## 2019-11-23 LAB — RAPID URINE DRUG SCREEN, HOSP PERFORMED
Amphetamines: NOT DETECTED
Barbiturates: NOT DETECTED
Benzodiazepines: NOT DETECTED
Cocaine: NOT DETECTED
Opiates: NOT DETECTED
Tetrahydrocannabinol: NOT DETECTED

## 2019-11-23 LAB — GLUCOSE, CAPILLARY
Glucose-Capillary: 101 mg/dL — ABNORMAL HIGH (ref 70–99)
Glucose-Capillary: 104 mg/dL — ABNORMAL HIGH (ref 70–99)
Glucose-Capillary: 103 mg/dL — ABNORMAL HIGH (ref 70–99)
Glucose-Capillary: 133 mg/dL — ABNORMAL HIGH (ref 70–99)

## 2019-11-23 LAB — TROPONIN I (HIGH SENSITIVITY): Troponin I (High Sensitivity): 5 ng/L (ref <18)

## 2019-11-23 LAB — ECHOCARDIOGRAM COMPLETE
Height: 72 in
Weight: 2964.8 oz

## 2019-11-23 LAB — SARS CORONAVIRUS 2 (TAT 6-24 HRS): SARS Coronavirus 2: NEGATIVE

## 2019-11-23 MED ORDER — ERENUMAB-AOOE 70 MG/ML ~~LOC~~ SOAJ: 70.0000 mg | SUBCUTANEOUS | Status: DC

## 2019-11-23 MED ORDER — TIZANIDINE HCL 4 MG PO TABS
4.0000 mg | ORAL_TABLET | Freq: Every day | ORAL | Status: DC
  Administered 2019-11-23: 22:00:00 4 mg via ORAL
  Filled 2019-11-23 (×2): qty 1

## 2019-11-23 MED ORDER — TOPIRAMATE 25 MG PO TABS
50.0000 mg | ORAL_TABLET | Freq: Two times a day (BID) | ORAL | Status: DC
  Administered 2019-11-23 – 2019-11-24 (×3): 50 mg via ORAL
  Filled 2019-11-23 (×4): qty 2

## 2019-11-23 MED ORDER — CLONAZEPAM 0.5 MG PO TABS
0.5000 mg | ORAL_TABLET | Freq: Three times a day (TID) | ORAL | Status: DC
  Administered 2019-11-23 – 2019-11-24 (×3): 0.5 mg via ORAL
  Filled 2019-11-23 (×3): qty 1

## 2019-11-23 MED ORDER — INSULIN ASPART 100 UNIT/ML ~~LOC~~ SOLN: 0.0000 [IU] | Freq: Every day | SUBCUTANEOUS | Status: DC

## 2019-11-23 MED ORDER — ENSURE ENLIVE PO LIQD
237.0000 mL | Freq: Two times a day (BID) | ORAL | Status: DC
  Administered 2019-11-23 – 2019-11-24 (×2): 237 mL via ORAL

## 2019-11-23 MED ORDER — DILTIAZEM HCL 25 MG/5ML IV SOLN
10.0000 mg | Freq: Once | INTRAVENOUS | Status: AC
  Administered 2019-11-23: 17:00:00 10 mg via INTRAVENOUS

## 2019-11-23 MED ORDER — EZETIMIBE 10 MG PO TABS
10.0000 mg | ORAL_TABLET | Freq: Every day | ORAL | Status: DC
  Administered 2019-11-23 – 2019-11-24 (×2): 10 mg via ORAL
  Filled 2019-11-23 (×2): qty 1

## 2019-11-23 MED ORDER — NITROGLYCERIN 0.4 MG SL SUBL
SUBLINGUAL_TABLET | SUBLINGUAL | Status: AC
  Administered 2019-11-23: 0.8 mg
  Administered 2019-11-23: 0.4 mg
  Filled 2019-11-23: qty 2

## 2019-11-23 MED ORDER — METOPROLOL TARTRATE 50 MG PO TABS
50.0000 mg | ORAL_TABLET | Freq: Once | ORAL | Status: AC
  Administered 2019-11-23: 16:00:00 50 mg via ORAL
  Filled 2019-11-23: qty 1

## 2019-11-23 MED ORDER — BUSPIRONE HCL 5 MG PO TABS
10.0000 mg | ORAL_TABLET | Freq: Three times a day (TID) | ORAL | Status: DC
  Administered 2019-11-23 – 2019-11-24 (×3): 10 mg via ORAL
  Filled 2019-11-23 (×3): qty 2

## 2019-11-23 MED ORDER — ADULT MULTIVITAMIN W/MINERALS CH
1.0000 | ORAL_TABLET | Freq: Every day | ORAL | Status: DC
  Administered 2019-11-24: 1 via ORAL
  Filled 2019-11-23: qty 1

## 2019-11-23 MED ORDER — NITROGLYCERIN 0.4 MG SL SUBL
SUBLINGUAL_TABLET | SUBLINGUAL | Status: AC
  Filled 2019-11-23: qty 1

## 2019-11-23 MED ORDER — METOPROLOL TARTRATE 5 MG/5ML IV SOLN
INTRAVENOUS | Status: AC
  Administered 2019-11-23: 17:00:00 5 mg via INTRAVENOUS
  Filled 2019-11-23: qty 10

## 2019-11-23 MED ORDER — PROPRANOLOL HCL ER 80 MG PO CP24
80.0000 mg | ORAL_CAPSULE | Freq: Every day | ORAL | Status: DC
  Administered 2019-11-23: 80 mg via ORAL
  Filled 2019-11-23 (×2): qty 1

## 2019-11-23 MED ORDER — DICLOFENAC SODIUM 1 % EX GEL
2.0000 g | Freq: Four times a day (QID) | CUTANEOUS | Status: DC
  Administered 2019-11-23 – 2019-11-24 (×4): 2 g via TOPICAL
  Filled 2019-11-23: qty 100

## 2019-11-23 MED ORDER — TAMSULOSIN HCL 0.4 MG PO CAPS
0.4000 mg | ORAL_CAPSULE | Freq: Every day | ORAL | Status: DC
  Administered 2019-11-23: 22:00:00 0.4 mg via ORAL
  Filled 2019-11-23 (×2): qty 1

## 2019-11-23 MED ORDER — METOPROLOL TARTRATE 5 MG/5ML IV SOLN
2.5000 mg | INTRAVENOUS | Status: DC | PRN
  Administered 2019-11-23: 17:00:00 5 mg via INTRAVENOUS
  Filled 2019-11-23: qty 5

## 2019-11-23 MED ORDER — CLONAZEPAM 0.25 MG PO TBDP
0.5000 mg | ORAL_TABLET | Freq: Once | ORAL | Status: AC
  Administered 2019-11-23: 0.5 mg via ORAL
  Filled 2019-11-23: qty 2

## 2019-11-23 MED ORDER — GABAPENTIN 300 MG PO CAPS
600.0000 mg | ORAL_CAPSULE | Freq: Three times a day (TID) | ORAL | Status: DC
  Administered 2019-11-23 – 2019-11-24 (×3): 600 mg via ORAL
  Filled 2019-11-23 (×3): qty 2

## 2019-11-23 MED ORDER — IOHEXOL 350 MG/ML SOLN: 100.0000 mL | Freq: Once | INTRAVENOUS | Status: DC | PRN

## 2019-11-23 MED ORDER — ABACAVIR-DOLUTEGRAVIR-LAMIVUD 600-50-300 MG PO TABS
1.0000 | ORAL_TABLET | Freq: Every day | ORAL | Status: DC
  Administered 2019-11-23 – 2019-11-24 (×2): 1 via ORAL
  Filled 2019-11-23 (×2): qty 1

## 2019-11-23 MED ORDER — DILTIAZEM HCL 25 MG/5ML IV SOLN
INTRAVENOUS | Status: AC
  Administered 2019-11-23: 17:00:00 10 mg via INTRAVENOUS
  Filled 2019-11-23: qty 5

## 2019-11-23 MED ORDER — INSULIN ASPART 100 UNIT/ML ~~LOC~~ SOLN
0.0000 [IU] | Freq: Three times a day (TID) | SUBCUTANEOUS | Status: DC
  Administered 2019-11-24: 1 [IU] via SUBCUTANEOUS

## 2019-11-23 MED ORDER — PANTOPRAZOLE SODIUM 40 MG PO TBEC
40.0000 mg | DELAYED_RELEASE_TABLET | Freq: Every day | ORAL | Status: DC
  Administered 2019-11-24: 11:00:00 40 mg via ORAL
  Filled 2019-11-23 (×2): qty 1

## 2019-11-23 NOTE — Progress Notes (Signed)
Inpatient Diabetes Program Recommendations  AACE/ADA: New Consensus Statement on Inpatient Glycemic Control (2015)  Target Ranges:  Prepandial:   less than 140 mg/dL      Peak postprandial:   less than 180 mg/dL (1-2 hours)      Critically ill patients:  140 - 180 mg/dL   Lab Results  Component Value Date   GLUCAP 101 (H) 11/23/2019   HGBA1C 6.8 (H) 11/22/2019    Review of Glycemic Control Results for TARRIS, BHAT (MRN [id number]) as of 11/23/2019 09:37  Ref. Range 11/22/2019 16:42 11/22/2019 17:50 11/22/2019 20:26 11/22/2019 22:36 11/23/2019 07:38  Glucose-Capillary Latest Ref Range: 70 - 99 mg/dL 69 (L) 53 (L) 89 96 101 (H)   Diabetes history: DM 2 Outpatient Diabetes medications: Januvia 100 mg daily Current orders for Inpatient glycemic control:  Novolog sensitive tid with meals and HS  Inpatient Diabetes Program Recommendations:   Consider reducing Novolog correction to "very Sensitive" starting at 151 mg/dL.    Thanks  Adah Perl, RN, BC-ADM Inpatient Diabetes Coordinator Pager [phone] (8a-5p)

## 2019-11-23 NOTE — Progress Notes (Signed)
Initial Nutrition Assessment  DOCUMENTATION CODES:   Not applicable  INTERVENTION:   - Ensure Enlive po BID, each supplement provides 350 kcal and 20 grams of protein  - MVI with minerals daily  NUTRITION DIAGNOSIS:   Increased nutrient needs related to chronic illness (HIV) as evidenced by estimated needs.  GOAL:   Patient will meet greater than or equal to 90% of their needs  MONITOR:   PO intake, Supplement acceptance, Labs, Weight trends  REASON FOR ASSESSMENT:   Malnutrition Screening Tool    ASSESSMENT:   68 year old male who presented to the ED on 12/03 with chest pain. PMH of DM, HTN, HIV, HLD, CKD stage III, polysubstance abuse.   Unable to speak with pt at this time. Other providers in pt's room on RD's multiple attempts to see pt. Will attempt to obtain diet and weight history upon follow-up.  No meal completions documented at this time.  Received permission from Dr. Eliseo Squires to liberalize pt's diet to Carb Modified only.  Reviewed weight history in chart. Pt's weights have varied significantly over the last 8 months, from 72-106 kg. No real trends in pt's available weights. RD will monitor weights during admission.  RD will order oral nutrition supplements to aid pt in meeting kcal and protein needs during admission. Will also order daily MVI.  Medications reviewed and include: SSI IVF: NS @ 100 ml/hr  Labs reviewed: TG 387 CBG's: 53-104 x 24 hours  NUTRITION - FOCUSED PHYSICAL EXAM:  Unable to complete at this time.  Diet Order:   Diet Order            Diet Carb Modified Fluid consistency: Thin; Room service appropriate? Yes  Diet effective now              EDUCATION NEEDS:   No education needs have been identified at this time  Skin:  Skin Assessment: Reviewed RN Assessment  Last BM:  11/22/19  Height:   Ht Readings from Last 1 Encounters:  11/22/19 6' (1.829 m)    Weight:   Wt Readings from Last 1 Encounters:  11/22/19 84.1 kg     Ideal Body Weight:  80.9 kg  BMI:  Body mass index is 25.13 kg/m.  Estimated Nutritional Needs:   Kcal:  2000-2200  Protein:  100-115 grams  Fluid:  >/= 2.0 L    Gaynell Face, MS, RD, LDN Inpatient Clinical Dietitian Pager: [phone] Weekend/After Hours: [phone]

## 2019-11-23 NOTE — Progress Notes (Signed)
Echocardiogram 2D Echocardiogram has been performed.  Oneal Deputy Caldwell Kronenberger 11/23/2019, 1:42 PM

## 2019-11-23 NOTE — Progress Notes (Signed)
Progress Note    Brian Malone  [id number] DOB: 29-Jul-1951  DOA: 11/22/2019 PCP: Doreatha Lew, MD    Brief Narrative:     Medical records reviewed and are as summarized below:  Brian Malone is an 68 y.o. male with medical history significant of diabetes, hypertension, HIV disease, hyperlipidemia, chronic kidney disease stage III, polysubstance abuse including opiates and methamphetamine.  Patient goes from [facility] for his care.  He went to [facility] today complaining of 5 days of chest pain shortness of breath which is substernal.  Pain is on and off more squeezing.  He only had an episode of passing out 4 days ago where EMS were called.  He did not recollect what happened the last thing he remembered was EMS in his house.  He was given Narcan and patient recovered.  He denied drug abuse but is urine drug screen back in November showed positive opiates and methamphetamines.   Assessment/Plan:   Principal Problem:   Chest pain Active Problems:   Diabetes (HCC)   HIV (human immunodeficiency virus infection) ([facility])   Benign prostatic hyperplasia (BPH) with straining on urination   Depression   Essential hypertension   chest pain:  -sounds like angina.   -Patient has significant risk factors for coronary artery disease.  -echo -card consult appreciated: CTA of coronaries pending -d dimer was high but PE ruled out with CTA  hypertension:  -resume home meds   HIV disease:  -resume home meds  Polysubstance abuse history: Patient denies recent use of substances    Medical Consultants:    cards    Subjective:   Has chest discomfort worse with exertion prior to the CPR incident   Objective:    Vitals:   11/23/19 0358 11/23/19 0408 11/23/19 0738 11/23/19 1120  BP: 132/80  129/78 123/76  Pulse: 71 70 66 75  Resp: 15 14 15 15   Temp:   97.9 F (36.6 C) 97.8 F (36.6 C)  TempSrc:   Oral Oral  SpO2: 98% 98% 97%  97%  Weight:      Height:        Intake/Output Summary (Last 24 hours) at 11/23/2019 1640 Last data filed at 11/23/2019 1400 Gross per 24 hour  Intake 127.87 ml  Output 1130 ml  Net -1002.13 ml   Filed Weights   11/22/19 1540 11/22/19 2023  Weight: 86 kg 84.1 kg    Exam: Unpleasant Chest wall tender to palpation  Data Reviewed:   I have personally reviewed following labs and imaging studies:  Labs: Labs show the following:   Basic Metabolic Panel: Recent Labs  Lab 11/22/19 1544 11/22/19 2113  NA 140  --   K 3.7  --   CL 111  --   CO2 22  --   GLUCOSE 123*  --   BUN 20  --   CREATININE 1.35* 1.25*  CALCIUM 9.5  --    GFR Estimated Creatinine Clearance: 64.7 mL/min (A) (by C-G formula based on SCr of 1.25 mg/dL (H)). Liver Function Tests: Recent Labs  Lab 11/22/19 1544  AST 15  ALT 15  ALKPHOS 114  BILITOT 0.9  PROT 7.9  ALBUMIN 4.1   No results for input(s): LIPASE, AMYLASE in the last 168 hours. No results for input(s): AMMONIA in the last 168 hours. Coagulation profile No results for input(s): INR, PROTIME in the last 168 hours.  CBC: Recent Labs  Lab 11/22/19 1545 11/22/19 2113  WBC 5.5 5.3  NEUTROABS 3.9  --   HGB 14.2 13.1  HCT 43.2 38.5*  MCV 100.7* 100.3*  PLT 151 163   Cardiac Enzymes: No results for input(s): CKTOTAL, CKMB, CKMBINDEX, TROPONINI in the last 168 hours. BNP (last 3 results) No results for input(s): PROBNP in the last 8760 hours. CBG: Recent Labs  Lab 11/22/19 2026 11/22/19 2236 11/23/19 0738 11/23/19 1118 11/23/19 1629  GLUCAP 89 96 101* 104* 103*   D-Dimer: Recent Labs    11/22/19 1545  DDIMER 2.83*   Hgb A1c: Recent Labs    11/22/19 2113  HGBA1C 6.8*   Lipid Profile: Recent Labs    11/22/19 2113  CHOL 149  HDL 25*  LDLCALC 47  TRIG 387*  CHOLHDL 6.0   Thyroid function studies: No results for input(s): TSH, T4TOTAL, T3FREE, THYROIDAB in the last 72 hours.  Invalid input(s):  FREET3 Anemia work up: No results for input(s): VITAMINB12, FOLATE, FERRITIN, TIBC, IRON, RETICCTPCT in the last 72 hours. Sepsis Labs: Recent Labs  Lab 11/22/19 1545 11/22/19 2113  WBC 5.5 5.3    Microbiology Recent Results (from the past 240 hour(s))  SARS CORONAVIRUS 2 (TAT 6-24 HRS) Nasopharyngeal Nasopharyngeal Swab     Status: None   Collection Time: 11/22/19  6:05 PM   Specimen: Nasopharyngeal Swab  Result Value Ref Range Status   SARS Coronavirus 2 NEGATIVE NEGATIVE Final    Comment: (NOTE) SARS-CoV-2 target nucleic acids are NOT DETECTED. The SARS-CoV-2 RNA is generally detectable in upper and lower respiratory specimens during the acute phase of infection. Negative results do not preclude SARS-CoV-2 infection, do not rule out co-infections with other pathogens, and should not be used as the sole basis for treatment or other patient management decisions. Negative results must be combined with clinical observations, patient history, and epidemiological information. The expected result is Negative. Fact Sheet for Patients: SugarRoll.be Fact Sheet for Healthcare Providers: https://www.woods-mathews.com/ This test is not yet approved or cleared by the [place] FDA and  has been authorized for detection and/or diagnosis of SARS-CoV-2 by FDA under an Emergency Use Authorization (EUA). This EUA will remain  in effect (meaning this test can be used) for the duration of the COVID-19 declaration under Section 56 4(b)(1) of the Act, 21 U.S.C. section 360bbb-3(b)(1), unless the authorization is terminated or revoked sooner. Performed at [facility], [place] [address], [place], [place] [address]     Procedures and diagnostic studies:  Dg Chest 2 View  Result Date: 11/22/2019 CLINICAL DATA:  Chest pain for 3 days EXAM: CHEST - 2 VIEW COMPARISON:  10/25/2019 FINDINGS: The heart size and mediastinal contours are within normal  limits. Both lungs are clear. Hardware in the cervical spine. IMPRESSION: No active cardiopulmonary disease. Electronically Signed   By: Donavan Foil M.D.   On: 11/22/2019 16:32   Ct Angio Chest Pe W And/or Wo Contrast  Result Date: 11/22/2019 CLINICAL DATA:  68 year old male with worsening chest pain. EXAM: CT ANGIOGRAPHY CHEST WITH CONTRAST TECHNIQUE: Multidetector CT imaging of the chest was performed using the standard protocol during bolus administration of intravenous contrast. Multiplanar CT image reconstructions and MIPs were obtained to evaluate the vascular anatomy. CONTRAST:  165mL OMNIPAQUE IOHEXOL 350 MG/ML SOLN COMPARISON:  Chest CT dated 04/15/2019. FINDINGS: Cardiovascular: There is no cardiomegaly or pericardial effusion. Coronary vascular calcification primarily involving the LAD and left circumflex artery. Mild atherosclerotic calcification of the aortic arch. No aneurysmal dilatation or dissection. The visualized origins of the great  vessels of the aortic arch appear patent. There is no CT evidence of pulmonary embolism. Mediastinum/Nodes: No hilar or mediastinal adenopathy. The esophagus is grossly unremarkable. No mediastinal fluid collection. Lungs/Pleura: Probable trace left pleural effusion versus less likely minimal pleural thickening. Minimal hazy density in the left lower lobe and left lung base, likely atelectasis. Atypical infection is less likely but not excluded. Clinical correlation is recommended. There is no focal consolidation or pneumothorax. The central airways are patent. Upper Abdomen: No acute abnormality. Musculoskeletal: No chest wall abnormality. No acute or significant osseous findings. Review of the MIP images confirms the above findings. IMPRESSION: 1. No CT evidence of pulmonary embolism. 2. Probable trace left pleural effusion. 3. Faint left lower lobe hazy densities, likely atelectasis. Developing atypical infection is less likely. Clinical correlation is  recommended. No focal consolidation. Electronically Signed   By: Anner Crete M.D.   On: 11/22/2019 17:31    Medications:    abacavir-dolutegravir-lamiVUDine  1 tablet Oral Daily   busPIRone  10 mg Oral TID   clonazePAM  0.5 mg Oral TID   dextrose  50 mL Intravenous Once   diclofenac Sodium  2 g Topical QID   enoxaparin (LOVENOX) injection  40 mg Subcutaneous Q24H   ezetimibe  10 mg Oral Daily   feeding supplement (ENSURE ENLIVE)  237 mL Oral BID BM   gabapentin  600 mg Oral TID   insulin aspart  0-5 Units Subcutaneous QHS   insulin aspart  0-6 Units Subcutaneous TID WC   [START ON 11/24/2019] multivitamin with minerals  1 tablet Oral Daily   [START ON 11/24/2019] pantoprazole  40 mg Oral QAC breakfast   propranolol ER  80 mg Oral Daily   tamsulosin  0.4 mg Oral QHS   tiZANidine  4 mg Oral QHS   topiramate  50 mg Oral BID   Continuous Infusions:  sodium chloride 100 mL/hr at 11/23/19 0734     LOS: 0 days   Geradine Girt  [facility]   How to contact the [facility] Attending or Consulting provider [place] or covering provider during after hours [place], for this patient?  1. Check the care team in [facility] and look for a) attending/consulting [facility] provider listed and b) the [facility] team listed 2. Log into [email or web address] and use [place]'s universal password to access. If you do not have the password, please contact the hospital operator. 3. Locate the [facility] provider you are looking for under [facility] and page to a number that you can be directly reached. 4. If you still have difficulty reaching the provider, please page the [facility] (Director on Call) for the Hospitalists listed on amion for assistance.  11/23/2019, 4:40 PM
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
## 2019-11-23 NOTE — Care Management Obs Status (Signed)
[facility] NOTIFICATION   Patient Details  Name: Brian Malone MRN: [id number] Date of Birth: January 01, 1951   Medicare Observation Status Notification Given:  Yes(Pt was not willing to sign- he can not afford a bill and per patient has asked to leave earlier in the day. Copy of form left in the room.)    Bethena Roys, RN 11/23/2019, 4:33 PM

## 2019-11-24 DIAGNOSIS — R079 Chest pain, unspecified: Secondary | ICD-10-CM | POA: Diagnosis not present

## 2019-11-24 DIAGNOSIS — I2510 Atherosclerotic heart disease of native coronary artery without angina pectoris: Secondary | ICD-10-CM | POA: Diagnosis not present

## 2019-11-24 DIAGNOSIS — N1832 Chronic kidney disease, stage 3b: Secondary | ICD-10-CM | POA: Diagnosis not present

## 2019-11-24 DIAGNOSIS — R0789 Other chest pain: Secondary | ICD-10-CM | POA: Diagnosis not present

## 2019-11-24 DIAGNOSIS — E1121 Type 2 diabetes mellitus with diabetic nephropathy: Secondary | ICD-10-CM | POA: Diagnosis not present

## 2019-11-24 DIAGNOSIS — Z21 Asymptomatic human immunodeficiency virus [HIV] infection status: Secondary | ICD-10-CM | POA: Diagnosis not present

## 2019-11-24 LAB — GLUCOSE, CAPILLARY
Glucose-Capillary: 140 mg/dL — ABNORMAL HIGH (ref 70–99)
Glucose-Capillary: 163 mg/dL — ABNORMAL HIGH (ref 70–99)

## 2019-11-24 MED ORDER — ISOSORBIDE MONONITRATE ER 30 MG PO TB24
30.0000 mg | ORAL_TABLET | Freq: Every day | ORAL | Status: DC
  Administered 2019-11-24: 11:00:00 30 mg via ORAL
  Filled 2019-11-24: qty 1

## 2019-11-24 MED ORDER — ISOSORBIDE MONONITRATE ER 30 MG PO TB24: 30.0000 mg | ORAL_TABLET | Freq: Every day | ORAL | 0 refills | Status: DC

## 2019-11-24 MED ORDER — GABAPENTIN 300 MG PO CAPS: 600.0000 mg | ORAL_CAPSULE | Freq: Three times a day (TID) | ORAL | Status: DC

## 2019-11-24 NOTE — Plan of Care (Signed)

## 2019-11-24 NOTE — Progress Notes (Signed)
Progress Note  Patient Name: Brian Malone Date of Encounter: 11/24/2019  Primary Cardiologist: No primary care provider on file.   Subjective   CCTA shows moderate CAD. Patient had no chest pain overnight. Eager to go home but wants to do the right thing.   Inpatient Medications    Scheduled Meds:  abacavir-dolutegravir-lamiVUDine  1 tablet Oral Daily   busPIRone  10 mg Oral TID   clonazePAM  0.5 mg Oral TID   dextrose  50 mL Intravenous Once   diclofenac Sodium  2 g Topical QID   enoxaparin (LOVENOX) injection  40 mg Subcutaneous Q24H   ezetimibe  10 mg Oral Daily   feeding supplement (ENSURE ENLIVE)  237 mL Oral BID BM   gabapentin  600 mg Oral TID   insulin aspart  0-5 Units Subcutaneous QHS   insulin aspart  0-6 Units Subcutaneous TID WC   multivitamin with minerals  1 tablet Oral Daily   pantoprazole  40 mg Oral QAC breakfast   propranolol ER  80 mg Oral Daily   tamsulosin  0.4 mg Oral QHS   tiZANidine  4 mg Oral QHS   topiramate  50 mg Oral BID   Continuous Infusions:  PRN Meds: acetaminophen, iohexol, metoprolol tartrate, ondansetron (ZOFRAN) IV   Vital Signs    Vitals:   11/23/19 2043 11/24/19 0527 11/24/19 0532 11/24/19 0751  BP: 135/80 129/86  123/76  Pulse: 77 76  64  Resp: 16 14  15   Temp: 98.5 F (36.9 C) 97.7 F (36.5 C)  (!) 97.5 F (36.4 C)  TempSrc: Oral Oral  Oral  SpO2: 100% 99%  97%  Weight:   83.4 kg   Height:        Intake/Output Summary (Last 24 hours) at 11/24/2019 0830 Last data filed at 11/24/2019 0500 Gross per 24 hour  Intake 240 ml  Output 1150 ml  Net -910 ml   Last 3 Weights 11/24/2019 11/22/2019 11/22/2019  Weight (lbs) 183 lb 13.8 oz 185 lb 4.8 oz 189 lb 9.5 oz  Weight (kg) 83.4 kg 84.052 kg 86 kg      Telemetry    SR - Personally Reviewed  ECG    No new - Personally Reviewed  Physical Exam   GEN: No acute distress.   Neck: No JVD Cardiac: RRR, no murmurs, rubs, or gallops.    Respiratory: Clear to auscultation bilaterally. GI: Soft, nontender, non-distended  MS: No edema; No deformity. Neuro:  Nonfocal  Psych: Normal affect   Labs    High Sensitivity Troponin:   Recent Labs  Lab 11/22/19 1544 11/22/19 2113 11/22/19 2303  TROPONINIHS 3 3 5       Chemistry Recent Labs  Lab 11/22/19 1544 11/22/19 2113  NA 140  --   K 3.7  --   CL 111  --   CO2 22  --   GLUCOSE 123*  --   BUN 20  --   CREATININE 1.35* 1.25*  CALCIUM 9.5  --   PROT 7.9  --   ALBUMIN 4.1  --   AST 15  --   ALT 15  --   ALKPHOS 114  --   BILITOT 0.9  --   GFRNONAA 55* >60  GFRAA >60 >60  ANIONGAP 7  --      Hematology Recent Labs  Lab 11/22/19 1545 11/22/19 2113  WBC 5.5 5.3  RBC 4.29 3.84*  HGB 14.2 13.1  HCT 43.2 38.5*  MCV 100.7* 100.3*  MCH 33.1 34.1*  MCHC 32.9 34.0  RDW 13.2 13.2  PLT 151 163    BNPNo results for input(s): BNP, PROBNP in the last 168 hours.   DDimer  Recent Labs  Lab 11/22/19 1545  DDIMER 2.83*     Radiology    Dg Chest 2 View  Result Date: 11/22/2019 CLINICAL DATA:  Chest pain for 3 days EXAM: CHEST - 2 VIEW COMPARISON:  10/25/2019 FINDINGS: The heart size and mediastinal contours are within normal limits. Both lungs are clear. Hardware in the cervical spine. IMPRESSION: No active cardiopulmonary disease. Electronically Signed   By: Donavan Foil M.D.   On: 11/22/2019 16:32   Ct Angio Chest Pe W And/or Wo Contrast  Result Date: 11/22/2019 CLINICAL DATA:  68 year old male with worsening chest pain. EXAM: CT ANGIOGRAPHY CHEST WITH CONTRAST TECHNIQUE: Multidetector CT imaging of the chest was performed using the standard protocol during bolus administration of intravenous contrast. Multiplanar CT image reconstructions and MIPs were obtained to evaluate the vascular anatomy. CONTRAST:  148mL OMNIPAQUE IOHEXOL 350 MG/ML SOLN COMPARISON:  Chest CT dated 04/15/2019. FINDINGS: Cardiovascular: There is no cardiomegaly or pericardial  effusion. Coronary vascular calcification primarily involving the LAD and left circumflex artery. Mild atherosclerotic calcification of the aortic arch. No aneurysmal dilatation or dissection. The visualized origins of the great vessels of the aortic arch appear patent. There is no CT evidence of pulmonary embolism. Mediastinum/Nodes: No hilar or mediastinal adenopathy. The esophagus is grossly unremarkable. No mediastinal fluid collection. Lungs/Pleura: Probable trace left pleural effusion versus less likely minimal pleural thickening. Minimal hazy density in the left lower lobe and left lung base, likely atelectasis. Atypical infection is less likely but not excluded. Clinical correlation is recommended. There is no focal consolidation or pneumothorax. The central airways are patent. Upper Abdomen: No acute abnormality. Musculoskeletal: No chest wall abnormality. No acute or significant osseous findings. Review of the MIP images confirms the above findings. IMPRESSION: 1. No CT evidence of pulmonary embolism. 2. Probable trace left pleural effusion. 3. Faint left lower lobe hazy densities, likely atelectasis. Developing atypical infection is less likely. Clinical correlation is recommended. No focal consolidation. Electronically Signed   By: Anner Crete M.D.   On: 11/22/2019 17:31   Ct Coronary Morph W/cta Cor W/score W/ca W/cm &/or Wo/cm  Result Date: 11/23/2019 CLINICAL DATA:  68 year old male with h/o DM, HTN, HIV, CKD, drug use w/ amphetamines and cocaine, who is being seen today for the evaluation of chest pain. EXAM: Cardiac/Coronary  CTA TECHNIQUE: The patient was scanned on a [employer]. FINDINGS: A 100 kV prospective scan was triggered in the descending thoracic aorta at 111 HU's. Axial non-contrast 3 mm slices were carried out through the heart. The data set was analyzed on a dedicated work station and scored using the [place]. Gantry rotation speed was 250 msecs and  collimation was .6 mm. 50 mg PO Metoprolol, 10 mg IV Metoprolol, 10 mg IV Diltiazem and 1.2 mg of sl NTG was given. The 3D data set was reconstructed in 5% intervals of the 67-82 % of the R-R cycle. Diastolic phases were analyzed on a dedicated work station using MPR, MIP and VRT modes. The patient received 80 cc of contrast. Aorta: Normal size. Mild diffuse atherosclerotic disease and calcifications. No dissection. Aortic Valve: Trileaflet. Mild leaflet thickening and calcifications. Coronary Arteries:  Normal coronary origin.  Right dominance. RCA has a fairly small lumen for a dominant artery and gives rise to an acute PDA and  PLA. There is minimal diffuse calcified plaque in the proximal and mid RCA with stenosis 0-25%. Acute marginal is a small lumen artery that has a severe ostial calcified plaque with stenosis > 70%. PDA and PLA have small lumen and appear to have only mild disease. Left main is a large artery that gives rise to LAD and LCX arteries. Distal left main has minimal eccentric calcified plaque with stenosis 0-25%. LAD is a large vessel that gives rise to two small diagonal arteries. Proximal LAD has a long mild calcified plaque with stenosis 25-49%. Mid LAD has a mild focal calcified plaque followed by a noncalcified plaque, both with 25-49% stenosis. Mid to distal LAD has a highly dense focal calcified plaque with stenosis 50-69% but possible > 70%. After that the lumen is very small. D1,2 are small lumen arteries with minimal plaque. LCX is a non-dominant artery that gives rise to two OM branches. Proximal LCX artery has mild diffuse calcified plaque with stenosis 25-49%. Mid LCX artery has moderate predominantly calcified plaque with stenosis 50-69%. Distal portion has small lumen and appears to have severe mixed plaque with stenosis > 70%. OM1 is large branch with mild diffuse calcified plaque in the proximal portion with stenosis 25-49%. OM2 is a small lumen branch with motion in the proximal  segment but possible stenosis > 70%. Other findings: Normal pulmonary vein drainage into the left atrium. Normal left atrial appendage without a thrombus. Upper normal size of the pulmonary artery measuring 30 mm. IMPRESSION: 1. Coronary calcium score of 914. This was 60 percentile for age and sex matched control. 2. Normal coronary origin with right dominance. 3. Diffuse moderate CAD with severe stenosis in small lumen branches - ostial portion of acute marginal branch, distal portion of the LCX artery, OM2, and possibly in the mid to distal LAD. Given diffuse disease in small branches, aggressive risk factor modification, anginal therapy and abstinence from drugs is recommended. CT FFR will be submitted to evaluate proximal and distal LAD lesions. Electronically Signed   By: Ena Dawley   On: 11/23/2019 19:20    Cardiac Studies   EXAM: Cardiac/Coronary  CTA  TECHNIQUE: The patient was scanned on a [employer].  FINDINGS: A 100 kV prospective scan was triggered in the descending thoracic aorta at 111 HU's. Axial non-contrast 3 mm slices were carried out through the heart. The data set was analyzed on a dedicated work station and scored using the [place]. Gantry rotation speed was 250 msecs and collimation was .6 mm. 50 mg PO Metoprolol, 10 mg IV Metoprolol, 10 mg IV Diltiazem and 1.2 mg of sl NTG was given. The 3D data set was reconstructed in 5% intervals of the 67-82 % of the R-R cycle. Diastolic phases were analyzed on a dedicated work station using MPR, MIP and VRT modes. The patient received 80 cc of contrast.  Aorta: Normal size. Mild diffuse atherosclerotic disease and calcifications. No dissection.  Aortic Valve: Trileaflet. Mild leaflet thickening and calcifications.  Coronary Arteries:  Normal coronary origin.  Right dominance.  RCA has a fairly small lumen for a dominant artery and gives rise to an acute PDA and PLA. There is minimal diffuse  calcified plaque in the proximal and mid RCA with stenosis 0-25%.  Acute marginal is a small lumen artery that has a severe ostial calcified plaque with stenosis > 70%. PDA and PLA have small lumen and appear to have only mild disease.  Left main is a large artery that  gives rise to LAD and LCX arteries. Distal left main has minimal eccentric calcified plaque with stenosis 0-25%.  LAD is a large vessel that gives rise to two small diagonal arteries. Proximal LAD has a long mild calcified plaque with stenosis 25-49%. Mid LAD has a mild focal calcified plaque followed by a noncalcified plaque, both with 25-49% stenosis. Mid to distal LAD has a highly dense focal calcified plaque with stenosis 50-69% but possible > 70%. After that the lumen is very small.  D1,2 are small lumen arteries with minimal plaque.  LCX is a non-dominant artery that gives rise to two OM branches. Proximal LCX artery has mild diffuse calcified plaque with stenosis 25-49%. Mid LCX artery has moderate predominantly calcified plaque with stenosis 50-69%. Distal portion has small lumen and appears to have severe mixed plaque with stenosis > 70%.  OM1 is large branch with mild diffuse calcified plaque in the proximal portion with stenosis 25-49%.  OM2 is a small lumen branch with motion in the proximal segment but possible stenosis > 70%.  Other findings:  Normal pulmonary vein drainage into the left atrium.  Normal left atrial appendage without a thrombus.  Upper normal size of the pulmonary artery measuring 30 mm.  IMPRESSION: 1. Coronary calcium score of 914. This was 68 percentile for age and sex matched control.  2. Normal coronary origin with right dominance.  3. Diffuse moderate CAD with severe stenosis in small lumen branches - ostial portion of acute marginal branch, distal portion of the LCX artery, OM2, and possibly in the mid to distal LAD.  Given diffuse disease in small  branches, aggressive risk factor modification, anginal therapy and abstinence from drugs is recommended. CT FFR will be submitted to evaluate proximal and distal LAD lesions.  Patient Profile     SHERMAN PERAULT is a 68 y.o. male with a hx of DM, HTN, HIV, renal disorder w/ Cr elevated at times, drug use w/ amphetamines and cocaine, who is being seen today for the evaluation of chest pain at the request of Dr Eliseo Squires.  Assessment & Plan    1. Chest pain, moderate CAD - I have independently interpreted the CT FFR performed on Mr. Ferrigno' CCTA, final report pending. There is an indeterminte FFR lesion in the mid LAD. There is also a hemodynamically significant lesion in the ostial acute marginal branch as well as the distal circumflex, both of which have been described as small lumen vessels.  I have discussed the mid-LAD lesion with the patient and we have participated in shared decision making.  We have discussed the risks of coronary angiography with PCI and subsequent medication noncompliance as a risk for in-stent thrombosis.  With an FFR of indeterminate significance 0.76 "grey zone" in the mid LAD, a strategy of medical management may be most appropriate.  I have offered the patient follow-up in my office should he so choose and we will reevaluate symptoms.  I have described for him that if he continues to have chest discomfort we can discuss coronary angiography with PCI at that time, and he can see [place] sooner in the office if so.  It would be reasonable to initiate Imdur for the management of angina in the setting of the mid LAD lesion.  I will start with 30 mg and we can uptitrate as an outpatient.  In addition he has had difficulty taking aspirin in the past and feels that it contributed to his gout but notes he was also on a  diuretic at that time.  He is recently had a subdural hematoma in early November, however last head CT demonstrated near resolution.  It would be best to have an evaluation  with neurosurgery prior to initiating an aspirin regimen, however if he is cleared to take aspirin from a neurosurgery standpoint with the recent subdural hematoma, and 81 mg aspirin daily would be advisable for the medical management of coronary artery disease.  2. HLD - He is on therapy for his HIV and takes ezetimibe for hyperlipidemia specifically hypertriglyceridemia.   [facility] will sign off.   Medication Recommendations:   -Patient can continue taking propranolol 80 mg daily and can restart on hospital discharge. -I will initiate Imdur 30 mg daily for the benefit of his chest pain with a known mid LAD lesion. Will uptitrate as an outpatient. -If cleared by neurosurgery, and 81 mg aspirin would be reasonable for the medical management and secondary prevention of CAD.  Consider outpatient neurosurgery appointment for review of this. -Continue ezetimibe for hyperlipidemia. Other recommendations (labs, testing, etc):  none Follow up as an outpatient:  1 month with cardiology (we will arrange), 1 week with PCP for review of symptoms and medications.   For questions or updates, please contact [place] Please consult [email or web address] for contact info under        Signed, Elouise Munroe, MD  11/24/2019, 8:30 AM

## 2019-11-24 NOTE — Discharge Instructions (Signed)
Heart-Healthy Eating Plan °Heart-healthy meal planning includes: °· Eating less unhealthy fats. °· Eating more healthy fats. °· Making other changes in your diet. °Talk with your doctor or a diet specialist (dietitian) to create an eating plan that is right for you. °What is my plan? °Your doctor may recommend an eating plan that includes: °· Total fat: ______% or less of total calories a day. °· Saturated fat: ______% or less of total calories a day. °· Cholesterol: less than _________mg a day. °What are tips for following this plan? °Cooking °Avoid frying your food. Try to bake, boil, grill, or broil it instead. You can also reduce fat by: °· Removing the skin from poultry. °· Removing all visible fats from meats. °· Steaming vegetables in water or broth. °Meal planning ° °· At meals, divide your plate into four equal parts: °? Fill one-half of your plate with vegetables and green salads. °? Fill one-fourth of your plate with whole grains. °? Fill one-fourth of your plate with lean protein foods. °· Eat 4-5 servings of vegetables per day. A serving of vegetables is: °? 1 cup of raw or cooked vegetables. °? 2 cups of raw leafy greens. °· Eat 4-5 servings of fruit per day. A serving of fruit is: °? 1 medium whole fruit. °? ¼ cup of dried fruit. °? ½ cup of fresh, frozen, or canned fruit. °? ½ cup of 100% fruit juice. °· Eat more foods that have soluble fiber. These are apples, broccoli, carrots, beans, peas, and barley. Try to get 20-30 g of fiber per day. °· Eat 4-5 servings of nuts, legumes, and seeds per week: °? 1 serving of dried beans or legumes equals ½ cup after being cooked. °? 1 serving of nuts is ¼ cup. °? 1 serving of seeds equals 1 tablespoon. °General information °· Eat more home-cooked food. Eat less restaurant, buffet, and fast food. °· Limit or avoid alcohol. °· Limit foods that are high in starch and sugar. °· Avoid fried foods. °· Lose weight if you are overweight. °· Keep track of how much salt  (sodium) you eat. This is important if you have high blood pressure. Ask your doctor to tell you more about this. °· Try to add vegetarian meals each week. °Fats °· Choose healthy fats. These include olive oil and canola oil, flaxseeds, walnuts, almonds, and seeds. °· Eat more omega-3 fats. These include salmon, mackerel, sardines, tuna, flaxseed oil, and ground flaxseeds. Try to eat fish at least 2 times each week. °· Check food labels. Avoid foods with trans fats or high amounts of saturated fat. °· Limit saturated fats. °? These are often found in animal products, such as meats, butter, and cream. °? These are also found in plant foods, such as palm oil, palm kernel oil, and coconut oil. °· Avoid foods with partially hydrogenated oils in them. These have trans fats. Examples are stick margarine, some tub margarines, cookies, crackers, and other baked goods. °What foods can I eat? °Fruits °All fresh, canned (in natural juice), or frozen fruits. °Vegetables °Fresh or frozen vegetables (raw, steamed, roasted, or grilled). Green salads. °Grains °Most grains. Choose whole wheat and whole grains most of the time. Rice and pasta, including brown rice and pastas made with whole wheat. °Meats and other proteins °Lean, well-trimmed beef, veal, pork, and lamb. Chicken and turkey without skin. All fish and shellfish. Wild duck, rabbit, pheasant, and venison. Egg whites or low-cholesterol egg substitutes. Dried beans, peas, lentils, and tofu. Seeds and most   nuts. °Dairy °Low-fat or nonfat cheeses, including ricotta and mozzarella. Skim or 1% milk that is liquid, powdered, or evaporated. Buttermilk that is made with low-fat milk. Nonfat or low-fat yogurt. °Fats and oils °Non-hydrogenated (trans-free) margarines. Vegetable oils, including soybean, sesame, sunflower, olive, peanut, safflower, corn, canola, and cottonseed. Salad dressings or mayonnaise made with a vegetable oil. °Beverages °Mineral water. Coffee and tea. Diet  carbonated beverages. °Sweets and desserts °Sherbet, gelatin, and fruit ice. Small amounts of dark chocolate. °Limit all sweets and desserts. °Seasonings and condiments °All seasonings and condiments. °The items listed above may not be a complete list of foods and drinks you can eat. Contact a dietitian for more options. °What foods should I avoid? °Fruits °Canned fruit in heavy syrup. Fruit in cream or butter sauce. Fried fruit. Limit coconut. °Vegetables °Vegetables cooked in cheese, cream, or butter sauce. Fried vegetables. °Grains °Breads that are made with saturated or trans fats, oils, or whole milk. Croissants. Sweet rolls. Donuts. High-fat crackers, such as cheese crackers. °Meats and other proteins °Fatty meats, such as hot dogs, ribs, sausage, bacon, rib-eye roast or steak. High-fat deli meats, such as salami and bologna. Caviar. Domestic duck and goose. Organ meats, such as liver. °Dairy °Cream, sour cream, cream cheese, and creamed cottage cheese. Whole-milk cheeses. Whole or 2% milk that is liquid, evaporated, or condensed. Whole buttermilk. Cream sauce or high-fat cheese sauce. Yogurt that is made from whole milk. °Fats and oils °Meat fat, or shortening. Cocoa butter, hydrogenated oils, palm oil, coconut oil, palm kernel oil. Solid fats and shortenings, including bacon fat, salt pork, lard, and butter. Nondairy cream substitutes. Salad dressings with cheese or sour cream. °Beverages °Regular sodas and juice drinks with added sugar. °Sweets and desserts °Frosting. Pudding. Cookies. Cakes. Pies. Milk chocolate or white chocolate. Buttered syrups. Full-fat ice cream or ice cream drinks. °The items listed above may not be a complete list of foods and drinks to avoid. Contact a dietitian for more information. °Summary °· Heart-healthy meal planning includes eating less unhealthy fats, eating more healthy fats, and making other changes in your diet. °· Eat a balanced diet. This includes fruits and  vegetables, low-fat or nonfat dairy, lean protein, nuts and legumes, whole grains, and heart-healthy oils and fats. °This information is not intended to replace advice given to you by your health care provider. Make sure you discuss any questions you have with your health care provider. °Document Released: 06/06/2012 Document Revised: 02/09/2018 Document Reviewed: 01/13/2018 °[employer]vier Patient Education © 2020 [employer] ° °

## 2019-11-24 NOTE — Discharge Summary (Signed)
Physician Discharge Summary  Brian Malone [id number] DOB: [birth year] DOA: 11/22/2019  PCP: Doreatha Lew, MD  Admit date: 11/22/2019 Discharge date: 11/24/2019  Admitted From: home Discharge disposition: home   Recommendations for Outpatient Follow-Up:   Titration of imdur Cardiology follow up for consideration of LHC Needs follow up with neurosurgery and if ok with them ASA 81 mg started    Discharge Diagnosis:   Principal Problem:   Chest pain Active Problems:   Diabetes ([place])   HIV (human immunodeficiency virus infection) ([place])   Benign prostatic hyperplasia (BPH) with straining on urination   Depression   Essential hypertension    Discharge Condition: Improved.  Diet recommendation: Low sodium, heart healthy.  Carbohydrate-modified  Wound care: None.  Code status: Full.   History of Present Illness:  Brian Malone is a 68 y.o. male with medical history significant of diabetes, hypertension, HIV disease, hyperlipidemia, chronic kidney disease stage III, polysubstance abuse including opiates and methamphetamine.  Patient goes from [facility] for his care.  He went to [place] today complaining of 5 days of chest pain shortness of breath which is substernal.  Pain is on and off more squeezing.  He only had an episode of passing out 4 days ago where EMS were called.  He did not recollect what happened the last thing he remembered was EMS in his house.  He was given Narcan and patient recovered.  He denied drug abuse but is urine drug screen back in November showed positive opiates and methamphetamines.  Today he denied taking any medications.  His chest pain is 102 so nitroglycerin in the ER.  He has abnormal EKG with no old 1 to compare.  Due to risk factors patient will be admitted for rule out MI.Marland Kitchen    Hospital Course by Problem:    chest pain: -d dimer was high but PE ruled out with CTA -Patient has  significant risk factors for coronary artery disease.  -echo:Left ventricular ejection fraction, by visual estimation, is 50 to 55%. The left ventricle has normal function. There is no left ventricular hypertrophy.  -card consult appreciated: Dr. Margaretann Loveless I have independently interpreted the CT FFR performed on [name]' CCTA, final report pending. There is an indeterminte FFR lesion in the mid LAD. There is also a hemodynamically significant lesion in the ostial acute marginal branch as well as the distal circumflex, both of which have been described as small lumen vessels.  I have discussed the mid-LAD lesion with the patient and we have participated in shared decision making.  We have discussed the risks of coronary angiography with PCI and subsequent medication noncompliance as a risk for in-stent thrombosis.  With an FFR of indeterminate significance 0.76 "grey zone" in the mid LAD, a strategy of medical management may be most appropriate.  I have offered the patient follow-up in my office should he so choose and we will reevaluate symptoms.  I have described for him that if he continues to have chest discomfort we can discuss coronary angiography with PCI at that time, and he can see [place] sooner in the office if so.  It would be reasonable to initiate Imdur for the management of angina in the setting of the mid LAD lesion.  I will start with 30 mg and we can uptitrate as an outpatient.  In addition he has had difficulty taking aspirin in the past and feels that it contributed to his gout but  notes he was also on a diuretic at that time.  He is recently had a subdural hematoma in early November, however last head CT demonstrated near resolution.  It would be best to have an evaluation with neurosurgery prior to initiating an aspirin regimen, however if he is cleared to take aspirin from a neurosurgery standpoint with the recent subdural hematoma, and 81 mg aspirin daily would be advisable for the medical  management of coronary artery disease.  hypertension: -resume home meds   HIV disease: -resume home meds  Polysubstance abuse history:Patient denies recent use of substances -uds during this stay was negative  DM type 2 -resume home meds   Medical Consultants:      Discharge Exam:   Vitals:   11/24/19 0950 11/24/19 0955  BP: 99/69 98/73  Pulse:    Resp: (!) 9 17  Temp:    SpO2:     Vitals:   11/24/19 0532 11/24/19 0751 11/24/19 0950 11/24/19 0955  BP:  123/76 99/69 98/73   Pulse:  64    Resp:  15 (!) 9 17  Temp:  (!) 97.5 F (36.4 C)    TempSrc:  Oral    SpO2:  97%    Weight: 83.4 kg     Height:        General exam: Appears calm and comfortable.   The results of significant diagnostics from this hospitalization (including imaging, microbiology, ancillary and laboratory) are listed below for reference.     Procedures and Diagnostic Studies:   Dg Chest 2 View  Result Date: 11/22/2019 CLINICAL DATA:  Chest pain for 3 days EXAM: CHEST - 2 VIEW COMPARISON:  10/25/2019 FINDINGS: The heart size and mediastinal contours are within normal limits. Both lungs are clear. Hardware in the cervical spine. IMPRESSION: No active cardiopulmonary disease. Electronically Signed   By: Donavan Foil M.D.   On: 11/22/2019 16:32   Ct Angio Chest Pe W And/or Wo Contrast  Result Date: 11/22/2019 CLINICAL DATA:  68 year old male with worsening chest pain. EXAM: CT ANGIOGRAPHY CHEST WITH CONTRAST TECHNIQUE: Multidetector CT imaging of the chest was performed using the standard protocol during bolus administration of intravenous contrast. Multiplanar CT image reconstructions and MIPs were obtained to evaluate the vascular anatomy. CONTRAST:  161mL OMNIPAQUE IOHEXOL 350 MG/ML SOLN COMPARISON:  Chest CT dated 04/15/2019. FINDINGS: Cardiovascular: There is no cardiomegaly or pericardial effusion. Coronary vascular calcification primarily involving the LAD and left circumflex artery. Mild  atherosclerotic calcification of the aortic arch. No aneurysmal dilatation or dissection. The visualized origins of the great vessels of the aortic arch appear patent. There is no CT evidence of pulmonary embolism. Mediastinum/Nodes: No hilar or mediastinal adenopathy. The esophagus is grossly unremarkable. No mediastinal fluid collection. Lungs/Pleura: Probable trace left pleural effusion versus less likely minimal pleural thickening. Minimal hazy density in the left lower lobe and left lung base, likely atelectasis. Atypical infection is less likely but not excluded. Clinical correlation is recommended. There is no focal consolidation or pneumothorax. The central airways are patent. Upper Abdomen: No acute abnormality. Musculoskeletal: No chest wall abnormality. No acute or significant osseous findings. Review of the MIP images confirms the above findings. IMPRESSION: 1. No CT evidence of pulmonary embolism. 2. Probable trace left pleural effusion. 3. Faint left lower lobe hazy densities, likely atelectasis. Developing atypical infection is less likely. Clinical correlation is recommended. No focal consolidation. Electronically Signed   By: Anner Crete M.D.   On: 11/22/2019 17:31   Ct Coronary Morph W/cta Cor  W/score W/ca W/cm &/or Wo/cm  Result Date: 11/23/2019 CLINICAL DATA:  68 year old male with h/o DM, HTN, HIV, CKD, drug use w/ amphetamines and cocaine, who is being seen today for the evaluation of chest pain. EXAM: Cardiac/Coronary  CTA TECHNIQUE: The patient was scanned on a [employer]. FINDINGS: A 100 kV prospective scan was triggered in the descending thoracic aorta at 111 HU's. Axial non-contrast 3 mm slices were carried out through the heart. The data set was analyzed on a dedicated work station and scored using the [place]. Gantry rotation speed was 250 msecs and collimation was .6 mm. 50 mg PO Metoprolol, 10 mg IV Metoprolol, 10 mg IV Diltiazem and 1.2 mg of sl NTG was  given. The 3D data set was reconstructed in 5% intervals of the 67-82 % of the R-R cycle. Diastolic phases were analyzed on a dedicated work station using MPR, MIP and VRT modes. The patient received 80 cc of contrast. Aorta: Normal size. Mild diffuse atherosclerotic disease and calcifications. No dissection. Aortic Valve: Trileaflet. Mild leaflet thickening and calcifications. Coronary Arteries:  Normal coronary origin.  Right dominance. RCA has a fairly small lumen for a dominant artery and gives rise to an acute PDA and PLA. There is minimal diffuse calcified plaque in the proximal and mid RCA with stenosis 0-25%. Acute marginal is a small lumen artery that has a severe ostial calcified plaque with stenosis > 70%. PDA and PLA have small lumen and appear to have only mild disease. Left main is a large artery that gives rise to LAD and LCX arteries. Distal left main has minimal eccentric calcified plaque with stenosis 0-25%. LAD is a large vessel that gives rise to two small diagonal arteries. Proximal LAD has a long mild calcified plaque with stenosis 25-49%. Mid LAD has a mild focal calcified plaque followed by a noncalcified plaque, both with 25-49% stenosis. Mid to distal LAD has a highly dense focal calcified plaque with stenosis 50-69% but possible > 70%. After that the lumen is very small. D1,2 are small lumen arteries with minimal plaque. LCX is a non-dominant artery that gives rise to two OM branches. Proximal LCX artery has mild diffuse calcified plaque with stenosis 25-49%. Mid LCX artery has moderate predominantly calcified plaque with stenosis 50-69%. Distal portion has small lumen and appears to have severe mixed plaque with stenosis > 70%. OM1 is large branch with mild diffuse calcified plaque in the proximal portion with stenosis 25-49%. OM2 is a small lumen branch with motion in the proximal segment but possible stenosis > 70%. Other findings: Normal pulmonary vein drainage into the left atrium.  Normal left atrial appendage without a thrombus. Upper normal size of the pulmonary artery measuring 30 mm. IMPRESSION: 1. Coronary calcium score of 914. This was 67 percentile for age and sex matched control. 2. Normal coronary origin with right dominance. 3. Diffuse moderate CAD with severe stenosis in small lumen branches - ostial portion of acute marginal branch, distal portion of the LCX artery, OM2, and possibly in the mid to distal LAD. Given diffuse disease in small branches, aggressive risk factor modification, anginal therapy and abstinence from drugs is recommended. CT FFR will be submitted to evaluate proximal and distal LAD lesions. Electronically Signed   By: Ena Dawley   On: 11/23/2019 19:20     Labs:   Basic Metabolic Panel: Recent Labs  Lab 11/22/19 1544 11/22/19 2113  NA 140  --   K 3.7  --   CL  111  --   CO2 22  --   GLUCOSE 123*  --   BUN 20  --   CREATININE 1.35* 1.25*  CALCIUM 9.5  --    GFR Estimated Creatinine Clearance: 64.7 mL/min (A) (by C-G formula based on SCr of 1.25 mg/dL (H)). Liver Function Tests: Recent Labs  Lab 11/22/19 1544  AST 15  ALT 15  ALKPHOS 114  BILITOT 0.9  PROT 7.9  ALBUMIN 4.1   No results for input(s): LIPASE, AMYLASE in the last 168 hours. No results for input(s): AMMONIA in the last 168 hours. Coagulation profile No results for input(s): INR, PROTIME in the last 168 hours.  CBC: Recent Labs  Lab 11/22/19 1545 11/22/19 2113  WBC 5.5 5.3  NEUTROABS 3.9  --   HGB 14.2 13.1  HCT 43.2 38.5*  MCV 100.7* 100.3*  PLT 151 163   Cardiac Enzymes: No results for input(s): CKTOTAL, CKMB, CKMBINDEX, TROPONINI in the last 168 hours. BNP: Invalid input(s): POCBNP CBG: Recent Labs  Lab 11/23/19 0738 11/23/19 1118 11/23/19 1629 11/23/19 2133 11/24/19 0746  GLUCAP 101* 104* 103* 133* 140*   D-Dimer Recent Labs    11/22/19 1545  DDIMER 2.83*   Hgb A1c Recent Labs    11/22/19 2113  HGBA1C 6.8*   Lipid  Profile Recent Labs    11/22/19 2113  CHOL 149  HDL 25*  LDLCALC 47  TRIG 387*  CHOLHDL 6.0   Thyroid function studies No results for input(s): TSH, T4TOTAL, T3FREE, THYROIDAB in the last 72 hours.  Invalid input(s): FREET3 Anemia work up No results for input(s): VITAMINB12, FOLATE, FERRITIN, TIBC, IRON, RETICCTPCT in the last 72 hours. Microbiology Recent Results (from the past 240 hour(s))  SARS CORONAVIRUS 2 (TAT 6-24 HRS) Nasopharyngeal Nasopharyngeal Swab     Status: None   Collection Time: 11/22/19  6:05 PM   Specimen: Nasopharyngeal Swab  Result Value Ref Range Status   SARS Coronavirus 2 NEGATIVE NEGATIVE Final    Comment: (NOTE) SARS-CoV-2 target nucleic acids are NOT DETECTED. The SARS-CoV-2 RNA is generally detectable in upper and lower respiratory specimens during the acute phase of infection. Negative results do not preclude SARS-CoV-2 infection, do not rule out co-infections with other pathogens, and should not be used as the sole basis for treatment or other patient management decisions. Negative results must be combined with clinical observations, patient history, and epidemiological information. The expected result is Negative. Fact Sheet for Patients: SugarRoll.be Fact Sheet for Healthcare Providers: https://www.woods-mathews.com/ This test is not yet approved or cleared by the [place] FDA and  has been authorized for detection and/or diagnosis of SARS-CoV-2 by FDA under an Emergency Use Authorization (EUA). This EUA will remain  in effect (meaning this test can be used) for the duration of the COVID-19 declaration under Section 56 4(b)(1) of the Act, 21 U.S.C. section 360bbb-3(b)(1), unless the authorization is terminated or revoked sooner. Performed at [facility], [place] [address], [place], [place] [address]      Discharge Instructions:   Discharge Instructions    Diet - low sodium heart  healthy   Complete by: As directed    Diet Carb Modified   Complete by: As directed    Increase activity slowly   Complete by: As directed      Allergies as of 11/24/2019      Reactions   Adhesive [tape] Other (See Comments)   "Tape will take off my skin, as will Band-Aids"   Cyclobenzaprine Other (  See Comments)   Hallucinations   Cymbalta [duloxetine Hcl] Other (See Comments)   Makes PTSD- induced nightmares more vivid   Morphine Itching, Other (See Comments)   Hallucinations, aggression, and makes patient altered also      Medication List    STOP taking these medications   cyclobenzaprine 10 MG tablet Commonly known as: FLEXERIL   meclizine 12.5 MG tablet Commonly known as: ANTIVERT   mirabegron ER 50 MG Tb24 tablet Commonly known as: MYRBETRIQ     TAKE these medications   acetaminophen 500 MG tablet Commonly known as: TYLENOL Take 1,000 mg by mouth 3 (three) times daily.   Aimovig 70 MG/ML [place] Generic drug: Erenumab-aooe Inject 70 mg into the skin every 30 (thirty) days.   busPIRone 10 MG tablet Commonly known as: BUSPAR Take 10 mg by mouth 3 (three) times daily.   cetirizine 10 MG tablet Commonly known as: ZYRTEC Take 10 mg by mouth daily.   clonazePAM 0.5 MG tablet Commonly known as: KLONOPIN Take 0.5 mg by mouth 3 (three) times daily.   colchicine 0.6 MG tablet Take 0.6 mg by mouth daily as needed (as directed for gout flares).   ezetimibe 10 MG tablet Commonly known as: ZETIA Take 10 mg by mouth daily.   gabapentin 300 MG capsule Commonly known as: Neurontin Take 2 capsules (600 mg total) by mouth 3 (three) times daily.   hydroxypropyl methylcellulose / hypromellose 2.5 % ophthalmic solution Commonly known as: ISOPTO TEARS / GONIOVISC Place 1-2 drops into both eyes as needed for dry eyes.   isosorbide mononitrate 30 MG 24 hr tablet Commonly known as: IMDUR Take 1 tablet (30 mg total) by mouth daily.   Ozempic (0.25 or 0.5 MG/DOSE) 2  MG/1.5ML Sopn Generic drug: Semaglutide(0.25 or 0.5MG /DOS) Inject 0.5 mg into the skin once a week. Taking on Friday   pantoprazole 40 MG tablet Commonly known as: PROTONIX Take 40 mg by mouth daily before breakfast.   propranolol ER 80 MG 24 hr capsule Commonly known as: INDERAL LA Take 80 mg by mouth daily.   rizatriptan 10 MG tablet Commonly known as: MAXALT Take 10 mg by mouth as needed for migraine.   sitaGLIPtin 100 MG tablet Commonly known as: JANUVIA Take 100 mg by mouth daily.   tamsulosin 0.4 MG Caps capsule Commonly known as: FLOMAX Take 0.4 mg by mouth at bedtime.   tiZANidine 4 MG tablet Commonly known as: ZANAFLEX Take 4 mg by mouth at bedtime.   topiramate 50 MG tablet Commonly known as: TOPAMAX Take 50 mg by mouth 2 (two) times daily.   Triumeq 600-50-300 MG tablet Generic drug: abacavir-dolutegravir-lamiVUDine Take 1 tablet by mouth daily.   valACYclovir 1000 MG tablet Commonly known as: VALTREX Take 1,000 mg by mouth 3 (three) times daily as needed (as directed for trigeminal neuralgia flares). Trig neuralgia flare   Vitamin B-12 5000 MCG Tbdp Take 5,000 mcg by mouth daily.   vitamin C 1000 MG tablet Take 1,000 mg by mouth every other day.   Vitamin D (Ergocalciferol) 1.25 MG (50000 UT) Caps capsule Commonly known as: DRISDOL Take 50,000 Units by mouth every Thursday.      Follow-up Information    [name], [name], MD Follow up in 1 week(s).   Specialty: Family Medicine Contact information: [place] [address] [phone]        Elouise Munroe, MD. Schedule an appointment as soon as possible for a visit in 4 week(s).  Specialties: Cardiology, Radiology Contact information: [address] [place] [address] [phone]            Time coordinating discharge: 25 min  Signed:  Geradine Girt DO  [facility] 11/24/2019, 10:43 AM

## 2019-11-26 NOTE — Progress Notes (Signed)
[place] [employer]) Rx #[id number] Aimovig 70MG /ML auto-injectors   Wait for Determination Please wait for [facility] NCPDP 2017 to return a determination.

## 2019-11-28 NOTE — Progress Notes (Signed)
Received hard fax from [place] Member ID# [id number]  This authorization has been approved until 12/19/2020

## 2019-11-29 ENCOUNTER — Ambulatory Visit (INDEPENDENT_AMBULATORY_CARE_PROVIDER_SITE_OTHER): Payer: Medicare Other | Admitting: Internal Medicine

## 2019-11-29 ENCOUNTER — Encounter: Payer: Self-pay | Admitting: Internal Medicine

## 2019-11-29 ENCOUNTER — Other Ambulatory Visit: Payer: Self-pay

## 2019-11-29 VITALS — BP 104/71 | HR 84 | Ht 72.0 in | Wt 185.4 lb

## 2019-11-29 DIAGNOSIS — I10 Essential (primary) hypertension: Secondary | ICD-10-CM

## 2019-11-29 DIAGNOSIS — B20 Human immunodeficiency virus [HIV] disease: Secondary | ICD-10-CM

## 2019-11-29 DIAGNOSIS — I25110 Atherosclerotic heart disease of native coronary artery with unstable angina pectoris: Secondary | ICD-10-CM

## 2019-11-29 DIAGNOSIS — E118 Type 2 diabetes mellitus with unspecified complications: Secondary | ICD-10-CM

## 2019-11-29 DIAGNOSIS — E785 Hyperlipidemia, unspecified: Secondary | ICD-10-CM

## 2019-11-29 MED ORDER — ISOSORBIDE MONONITRATE ER 60 MG PO TB24: 60.0000 mg | ORAL_TABLET | Freq: Every day | ORAL | 3 refills | Status: DC

## 2019-11-29 NOTE — Patient Instructions (Signed)
Medication Instructions:   INCREASE YOUR IMDUR TO 60 MG EVERY DAY *If you need a refill on your cardiac medications before your next appointment, please call your pharmacy*  Lab Work: NOT NEED   Follow-Up: At [facility], you and your health needs are our priority.  As part of our continuing mission to provide you with exceptional heart care, we have created designated Provider Care Teams.  These Care Teams include your primary Cardiologist (physician) and Advanced Practice Providers (APPs -  Physician Assistants and Nurse Practitioners) who all work together to provide you with the care you need, when you need it.  Your next appointment:   1 month(s)  The format for your next appointment:   In Person  Provider:   Jory Sims, DNP, ANP

## 2019-12-03 ENCOUNTER — Telehealth: Payer: Self-pay
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
## 2019-12-05 ENCOUNTER — Emergency Department (HOSPITAL_BASED_OUTPATIENT_CLINIC_OR_DEPARTMENT_OTHER)
Admission: EM | Admit: 2019-12-05 | Discharge: 2019-12-05 | Disposition: A | Discharge status: Home / Self Care | Payer: Medicare Other | Attending: Emergency Medicine | Admitting: Emergency Medicine

## 2019-12-05 ENCOUNTER — Encounter (HOSPITAL_BASED_OUTPATIENT_CLINIC_OR_DEPARTMENT_OTHER): Payer: Self-pay

## 2019-12-05 ENCOUNTER — Other Ambulatory Visit: Payer: Self-pay

## 2019-12-05 DIAGNOSIS — L03114 Cellulitis of left upper limb: Secondary | ICD-10-CM | POA: Diagnosis not present

## 2019-12-05 DIAGNOSIS — Z21 Asymptomatic human immunodeficiency virus [HIV] infection status: Secondary | ICD-10-CM | POA: Diagnosis not present

## 2019-12-05 DIAGNOSIS — R21 Rash and other nonspecific skin eruption: Secondary | ICD-10-CM | POA: Diagnosis present

## 2019-12-05 DIAGNOSIS — E119 Type 2 diabetes mellitus without complications: Secondary | ICD-10-CM | POA: Insufficient documentation

## 2019-12-05 DIAGNOSIS — I10 Essential (primary) hypertension: Secondary | ICD-10-CM | POA: Insufficient documentation

## 2019-12-05 LAB — BASIC METABOLIC PANEL
Anion gap: 9 (ref 5–15)
BUN: 21 mg/dL (ref 8–23)
CO2: 21 mmol/L — ABNORMAL LOW (ref 22–32)
Calcium: 9.1 mg/dL (ref 8.9–10.3)
Chloride: 108 mmol/L (ref 98–111)
Creatinine, Ser: 1.33 mg/dL — ABNORMAL HIGH (ref 0.61–1.24)
GFR calc Af Amer: >60 mL/min (ref >60)
GFR calc non Af Amer: 55 mL/min — ABNORMAL LOW (ref >60)
Glucose, Bld: 165 mg/dL — ABNORMAL HIGH (ref 70–99)
Potassium: 4.2 mmol/L (ref 3.5–5.1)
Sodium: 138 mmol/L (ref 135–145)

## 2019-12-05 LAB — CBC WITH DIFFERENTIAL/PLATELET
Abs Immature Granulocytes: 0.03 10*3/uL (ref 0.00–0.07)
Basophils Absolute: 0 10*3/uL (ref 0.0–0.1)
Basophils Relative: 0 %
Eosinophils Absolute: 0.1 10*3/uL (ref 0.0–0.5)
Eosinophils Relative: 1 %
HCT: 37 % — ABNORMAL LOW (ref 39.0–52.0)
Hemoglobin: 12.1 g/dL — ABNORMAL LOW (ref 13.0–17.0)
Immature Granulocytes: 0 %
Lymphocytes Relative: 12 %
Lymphs Abs: 1.1 10*3/uL (ref 0.7–4.0)
MCH: 33.5 pg (ref 26.0–34.0)
MCHC: 32.7 g/dL (ref 30.0–36.0)
MCV: 102.5 fL — ABNORMAL HIGH (ref 80.0–100.0)
Monocytes Absolute: 0.7 10*3/uL (ref 0.1–1.0)
Monocytes Relative: 8 %
Neutro Abs: 7 10*3/uL (ref 1.7–7.7)
Neutrophils Relative %: 79 %
Platelets: 196 10*3/uL (ref 150–400)
RBC: 3.61 MIL/uL — ABNORMAL LOW (ref 4.22–5.81)
RDW: 13 % (ref 11.5–15.5)
WBC: 9 10*3/uL (ref 4.0–10.5)
nRBC: 0 % (ref 0.0–0.2)

## 2019-12-05 MED ORDER — DOXYCYCLINE HYCLATE 100 MG PO CAPS: 100.0000 mg | ORAL_CAPSULE | Freq: Two times a day (BID) | ORAL | 0 refills | Status: AC

## 2019-12-05 MED FILL — DOXYCYCLINE HYCLATE 100 MG: 100 | 7 days supply | Qty: 14 | Fill #0

## 2019-12-05 NOTE — Discharge Instructions (Signed)
He was seen in the emergency department today with skin redness and irritation.  This may be caused by a bacterial infection and I am starting antibiotics.  Please return either to the emergency department or follow-up with your primary care doctor in 48 hours for reevaluation of the rash.

## 2019-12-05 NOTE — ED Provider Notes (Signed)
Emergency Department Provider Note   I have reviewed the triage vital signs and the nursing notes.   HISTORY  Chief Complaint Abscess   HPI Brian Malone is a 68 y.o. male with PMH of HTN, HIV, and DM presents to the emergency department for evaluation of rash developing over the bilateral arms, elbow, posterior scalp.  Patient was apparently referred to the emergency department by his dermatologist after an appointment today.  Patient states that since being discharged from the hospital for chest pain he developed a rash over his arms.  He states it somewhat itchy.  He denies fever but notes some fatigue.   He has not been on any new medications or antibiotics.   Past Medical History:  Diagnosis Date  . Anxiety   . Diabetes mellitus without complication ([place])   . HIV (human immunodeficiency virus infection) ([place])   . Hypertension   . Renal disorder     Patient Active Problem List   Diagnosis Date Noted  . Chest pain 11/22/2019  . Acute subdural hematoma ([place]) 10/28/2019  . Subdural hematoma ([place]) 10/26/2019  . Dysphagia 06/20/2019  . AKI (acute kidney injury) ([place]) 06/20/2019  . HCAP (healthcare-associated pneumonia) 06/20/2019  . Normal anion gap metabolic acidosis [id number]  . ARF (acute renal failure) ([place]) 06/19/2019  . Cervical spinal stenosis 05/18/2019  . Lumbar spondylosis 05/18/2019  . Sacroiliitis ([place]) 05/15/2019  . Right foot ulcer ([place]) 05/13/2019  . HIV (human immunodeficiency virus infection) ([place]) 05/13/2019  . Tachycardia 04/16/2019  . Diabetes ([place]) 04/16/2019  . Cellulitis 04/16/2019  . Wound infection after surgery 04/15/2019  . Benign prostatic hyperplasia (BPH) with straining on urination 02/22/2019  . Essential hypertension 02/22/2019  . Post-traumatic osteoarthritis of both ankles 02/22/2019  . Trigeminal neuralgia 02/22/2019  . Uncontrolled type 2 diabetes mellitus with hyperglycemia ([place]) 02/22/2019  . Chronic bilateral low back pain  with bilateral sciatica 01/19/2019  . Diabetic autonomic neuropathy associated with type 2 diabetes mellitus ([place]) 01/19/2019  . Migraine with aura and without status migrainosus, not intractable 01/19/2019  . Neck pain 01/19/2019  . Neuropathy of both feet 01/19/2019  . Nocturia more than twice per night 01/07/2015  . Anxiety 10/23/2014  . Candida, oral 10/23/2014  . Depression 10/23/2014  . Insomnia 10/23/2014  . Rash of entire body 10/23/2014    Past Surgical History:  Procedure Laterality Date  . ANKLE ARTHROSCOPY    . APPENDECTOMY    . BACK SURGERY     FUSION  . THORACOTOMY Right 2008  ?  . TONSILLECTOMY      Allergies Adhesive [tape], Cyclobenzaprine, Cymbalta [duloxetine hcl], and Morphine  Family History  Problem Relation Age of Onset  . CAD Mother   . Lung cancer Mother   . CAD Father   . Lung cancer Father   . CAD Brother     Social History Social History   Tobacco Use  . Smoking status: Never Smoker  . Smokeless tobacco: Never Used  Substance Use Topics  . Alcohol use: Not Currently  . Drug use: Never    Review of Systems  Constitutional: No fever/chills Eyes: No visual changes. ENT: No sore throat. Cardiovascular: Chronic chest pain.  Respiratory: Denies shortness of breath. Gastrointestinal: No abdominal pain.  Musculoskeletal: Negative for back pain. Skin: Left arm rash with scattered lesions on the arms and neck.  Neurological: Negative for headaches, focal weakness or numbness.  10-point ROS otherwise negative.  ____________________________________________   PHYSICAL EXAM:  VITAL SIGNS: ED  Triage Vitals  Enc Vitals Group     BP 12/05/19 1254 119/77     Pulse Rate 12/05/19 1254 74     Resp 12/05/19 1254 18     Temp 12/05/19 1254 98.2 F (36.8 C)     Temp Source 12/05/19 1254 Oral     SpO2 12/05/19 1254 100 %     Weight 12/05/19 1254 188 lb (85.3 kg)     Height 12/05/19 1254 6' (1.829 m)   Constitutional: Alert and oriented.  Well appearing and in no acute distress. Eyes: Conjunctivae are normal.  Head: Atraumatic. Nose: No congestion/rhinnorhea. Mouth/Throat: Mucous membranes are moist. Neck: No stridor.   Cardiovascular: Normal rate, regular rhythm. Respiratory: Normal respiratory effort. Gastrointestinal: No distention.  Musculoskeletal: Redness over the left elbow/forearm.  No focal tenderness over the bursa.  Normal range of motion of the left elbow without pain.  Neurologic:  Normal speech and language. Skin:  Skin is warm and dry. Excoriations over the forearms with erythema and swelling over the left forearm/elbow without abscess or induration.    ____________________________________________   LABS (all labs ordered are listed, but only abnormal results are displayed)  Labs Reviewed  BASIC METABOLIC PANEL - Abnormal; Notable for the following components:      Result Value   CO2 21 (*)    Glucose, Bld 165 (*)    Creatinine, Ser 1.33 (*)    GFR calc non Af Amer 55 (*)    All other components within normal limits  CBC WITH DIFFERENTIAL/PLATELET - Abnormal; Notable for the following components:   RBC 3.61 (*)    Hemoglobin 12.1 (*)    HCT 37.0 (*)    MCV 102.5 (*)    All other components within normal limits   ____________________________________________  RADIOLOGY  None  ____________________________________________   PROCEDURES  Procedure(s) performed:   Procedures  None  ____________________________________________   INITIAL IMPRESSION / ASSESSMENT AND PLAN / ED COURSE  Pertinent labs & imaging results that were available during my care of the patient were reviewed by me and considered in my medical decision making (see chart for details).   Patient presents to the emergency department evaluation of rash which is worse over the left elbow.  There is no tenderness over the bursa to suspect septic bursitis.  Patient has completely normal range of motion of the left elbow.  I  have no evidence to suspect septic joint.  Plan for oral doxycycline trial as an outpatient with 48-hour follow-up to ensure that symptoms are improving.  Patient compliant with his home HIV medications and CD4 count WNL in March in [place].   Plan to cover with Doxycycline and return for 48 hour re-evaluation either with PCP or to the ED.  ____________________________________________  FINAL CLINICAL IMPRESSION(S) / ED DIAGNOSES  Final diagnoses:  Cellulitis of left upper extremity    Note:  This document was prepared using Dragon voice recognition software and may include unintentional dictation errors.  Nanda Quinton, MD, [facility] Emergency Medicine    Melani Brisbane, [facility], MD 12/05/19 [phone]

## 2019-12-05 NOTE — ED Triage Notes (Addendum)
Pt c/o scattered "abscesses", chills, body aches-states sx started since last hosp admn-pt states he was seen by PCP and was advised "pimples"-was seen by dermatologist today for skin cancer-was advised to come to ED-NAD-steady gait

## 2019-12-06 ENCOUNTER — Encounter: Payer: Self-pay | Admitting: Family Medicine

## 2019-12-06 ENCOUNTER — Ambulatory Visit (INDEPENDENT_AMBULATORY_CARE_PROVIDER_SITE_OTHER): Payer: Medicare Other | Admitting: Family Medicine

## 2019-12-06 DIAGNOSIS — G43109 Migraine with aura, not intractable, without status migrainosus: Secondary | ICD-10-CM | POA: Diagnosis not present

## 2019-12-06 DIAGNOSIS — S065XAA Traumatic subdural hemorrhage with loss of consciousness status unknown, initial encounter: Secondary | ICD-10-CM

## 2019-12-06 DIAGNOSIS — S065X9A Traumatic subdural hemorrhage with loss of consciousness of unspecified duration, initial encounter: Secondary | ICD-10-CM

## 2019-12-06 DIAGNOSIS — R42 Dizziness and giddiness: Secondary | ICD-10-CM | POA: Diagnosis not present
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
## 2019-12-26 ENCOUNTER — Other Ambulatory Visit: Payer: Self-pay

## 2019-12-26 ENCOUNTER — Ambulatory Visit (INDEPENDENT_AMBULATORY_CARE_PROVIDER_SITE_OTHER): Payer: Medicare Other | Admitting: Family Medicine

## 2019-12-26 ENCOUNTER — Encounter: Payer: Self-pay | Admitting: Family Medicine

## 2019-12-26 DIAGNOSIS — R42 Dizziness and giddiness: Secondary | ICD-10-CM | POA: Diagnosis not present

## 2019-12-26 NOTE — Assessment & Plan Note (Signed)
Patient is having some dizziness.  In addition to still having some mild difficulty with concentration.  Has been making progress though.  Very difficult patient to treat for possible concussion syndrome secondary to patient's other comorbidities.  Patient is going to be referred again to vestibular neuro training which I think will be beneficial for the dizziness and hopefully will improve.  Would like to follow-up with him again virtually in 3 weeks.  Patient will continue the vitamin supplementation.  Follow-up at that time

## 2019-12-26 NOTE — Progress Notes (Signed)
Virtual Visit via Video Note  I connected with [name] on 12/26/19 at  4:15 PM EST by a video enabled telemedicine application and verified that I am speaking with the correct person using two identifiers.  Location: Patient: Patient is in home setting alone Provider: In office setting   I discussed the limitations of evaluation and management by telemedicine and the availability of in person appointments. The patient expressed understanding and agreed to proceed.  History of Present Illness: Patient is a 69 year old gentleman who did have a subdural hematoma that did resolve after a repeat CT scan but continued to have dizziness as well as headaches.  He was making progress but states that it is slow.  Patient states that is approximately 20 to 25% better lately.    Observations/Objective: Alert and oriented, sitting comfortably in his chair.   Assessment and Plan: 69 year old gentleman who did have a head injury with multiple comorbidities who continues to have dizziness.  Encouraged him to follow-up with vestibular neuro training.  In addition to this discussed the home exercises that could be helpful.  See problem list.   Follow Up Instructions: 3 weeks virtual    I discussed the assessment and treatment plan with the patient. The patient was provided an opportunity to ask questions and all were answered. The patient agreed with the plan and demonstrated an understanding of the instructions.   The patient was advised to call back or seek an in-person evaluation if the symptoms worsen or if the condition fails to improve as anticipated.  I provided 26 minutes of face-to-face time during this encounter.   Lyndal Pulley, DO

## 2020-01-09 ENCOUNTER — Ambulatory Visit: Payer: Medicare Other | Admitting: Internal Medicine

## 2020-01-15 ENCOUNTER — Ambulatory Visit: Payer: Medicare Other | Admitting: Internal Medicine

## 2020-01-16 ENCOUNTER — Ambulatory Visit (INDEPENDENT_AMBULATORY_CARE_PROVIDER_SITE_OTHER): Payer: Medicare Other | Admitting: Family Medicine

## 2020-01-16 ENCOUNTER — Encounter: Payer: Self-pay | Admitting: Family Medicine

## 2020-01-16 VITALS — Ht 72.0 in

## 2020-01-16 DIAGNOSIS — R42 Dizziness and giddiness: Secondary | ICD-10-CM | POA: Diagnosis not present
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
## 2020-01-21 ENCOUNTER — Other Ambulatory Visit: Payer: Self-pay

## 2020-01-21 ENCOUNTER — Encounter: Payer: Self-pay | Admitting: Internal Medicine

## 2020-01-21 ENCOUNTER — Ambulatory Visit (INDEPENDENT_AMBULATORY_CARE_PROVIDER_SITE_OTHER): Payer: Medicare Other | Admitting: Internal Medicine

## 2020-01-21 VITALS — BP 110/78 | HR 44 | Temp 97.2°F | Ht 72.0 in | Wt 177.4 lb

## 2020-01-21 DIAGNOSIS — S065X9S Traumatic subdural hemorrhage with loss of consciousness of unspecified duration, sequela: Secondary | ICD-10-CM

## 2020-01-21 DIAGNOSIS — R079 Chest pain, unspecified: Secondary | ICD-10-CM | POA: Diagnosis not present

## 2020-01-21 DIAGNOSIS — B20 Human immunodeficiency virus [HIV] disease: Secondary | ICD-10-CM

## 2020-01-21 DIAGNOSIS — S065XAA Traumatic subdural hemorrhage with loss of consciousness status unknown, initial encounter: Secondary | ICD-10-CM

## 2020-01-21 DIAGNOSIS — E118 Type 2 diabetes mellitus with unspecified complications: Secondary | ICD-10-CM

## 2020-01-21 DIAGNOSIS — S065X9A Traumatic subdural hemorrhage with loss of consciousness of unspecified duration, initial encounter: Secondary | ICD-10-CM

## 2020-01-21 DIAGNOSIS — I10 Essential (primary) hypertension: Secondary | ICD-10-CM | POA: Diagnosis not present

## 2020-01-21 DIAGNOSIS — I25110 Atherosclerotic heart disease of native coronary artery with unstable angina pectoris: Secondary | ICD-10-CM | POA: Diagnosis not present

## 2020-01-21 DIAGNOSIS — E785 Hyperlipidemia, unspecified: Secondary | ICD-10-CM

## 2020-01-21 NOTE — Progress Notes (Signed)
Cardiology Office Note:    Date:  01/21/2020   ID:  [name], Belone [birth year], MRN [id number]  PCP:  [name], [name], MD  Cardiologist:  Elouise Munroe, MD  Electrophysiologist:  None   Referring MD: [name], [name], MD   Chief Complaint: Chest pain  History of Present Illness:    Brian Malone is a 69 y.o. male with a hx of diabetes, hypertension, HIV, hyperlipidemia, chronic kidney disease stage III.  Recent CT coronary angiography demonstrated moderate CAD with indeterminate FFR values.  He presents today for review of chest pain.  At our last visit we uptitrated his Imdur.  He tells me that he feels much better today.  He only experiences chest discomfort at heavy exertion, and overall is feeling far better than our last appointment.  He continues on a regimen of Imdur 60 mg daily and propranolol 80 mg daily.  I have offered him up titration of his Imdur for the benefit of continued antianginal therapy.  He says that he is doing quite well on the current dose and does not feel strongly to increase the dose at this time.  He is not currently on aspirin for secondary prevention of CAD given that he feels aspirin precipitates his episodes of gout.  In addition he suffered a traumatic subdural hematoma in November.  Subsequent head CT demonstrated near resolution.  Given his hesitance to take aspirin due to concern for gout, as well as this history of subdural hematoma, we have participated in shared decision making and decided to defer aspirin therapy at this time.  He should follow with neurology or neurosurgery for clearance to receive aspirin.  Once that clearance is provided to [place], we can discuss aspirin as secondary prevention.  He prefers propranolol as his beta-blocker given affordability.  Not currently on a statin, could consider with an eye towards his therapy for HIV. Past Medical History:  Diagnosis Date  . Anxiety   . Diabetes mellitus without  complication ([place])   . HIV (human immunodeficiency virus infection) ([place])   . Hypertension   . Renal disorder     Past Surgical History:  Procedure Laterality Date  . ANKLE ARTHROSCOPY    . APPENDECTOMY    . BACK SURGERY     FUSION  . THORACOTOMY Right 2008  ?  . TONSILLECTOMY      Current Medications: Current Meds  Medication Sig  . acetaminophen (TYLENOL) 500 MG tablet Take 1,000 mg by mouth 3 (three) times daily.   . Ascorbic Acid (VITAMIN C) 1000 MG tablet Take 1,000 mg by mouth every other day.  . busPIRone (BUSPAR) 10 MG tablet Take 10 mg by mouth 3 (three) times daily.   . cetirizine (ZYRTEC) 10 MG tablet Take 10 mg by mouth daily.  . clonazePAM (KLONOPIN) 0.5 MG tablet Take 0.5 mg by mouth 3 (three) times daily.  . colchicine 0.6 MG tablet Take 0.6 mg by mouth daily as needed (as directed for gout flares).  . Cyanocobalamin (VITAMIN B-12) 5000 MCG TBDP Take 5,000 mcg by mouth daily.   Eduard Roux (AIMOVIG) 70 MG/ML SOAJ Inject 70 mg into the skin every 30 (thirty) days.  Marland Kitchen ezetimibe (ZETIA) 10 MG tablet Take 10 mg by mouth daily.  Marland Kitchen gabapentin (NEURONTIN) 300 MG capsule Take 2 capsules (600 mg total) by mouth 3 (three) times daily.  . isosorbide mononitrate (IMDUR) 60 MG 24 hr tablet Take 1 tablet (60 mg total) by mouth daily.  Marland Kitchen  OZEMPIC, 0.25 OR 0.5 MG/DOSE, 2 MG/1.5ML SOPN Inject 0.5 mg into the skin once a week. Taking on Friday  . pantoprazole (PROTONIX) 40 MG tablet Take 40 mg by mouth daily before breakfast.   . propranolol ER (INDERAL LA) 80 MG 24 hr capsule Take 80 mg by mouth daily.   . rizatriptan (MAXALT) 10 MG tablet Take 10 mg by mouth as needed for migraine.   . tamsulosin (FLOMAX) 0.4 MG CAPS capsule Take 0.4 mg by mouth at bedtime.   Marland Kitchen tiZANidine (ZANAFLEX) 4 MG tablet Take 4 mg by mouth at bedtime.   . topiramate (TOPAMAX) 50 MG tablet Take 50 mg by mouth 2 (two) times daily.   . TRIUMEQ 600-50-300 MG tablet Take 1 tablet by mouth daily.   .  valACYclovir (VALTREX) 1000 MG tablet Take 1,000 mg by mouth 3 (three) times daily as needed (as directed for trigeminal neuralgia flares). Trig neuralgia flare  . Vitamin D, Ergocalciferol, (DRISDOL) 1.25 MG (50000 UT) CAPS capsule Take 50,000 Units by mouth every Thursday.   . [DISCONTINUED] hydroxypropyl methylcellulose / hypromellose (ISOPTO TEARS / GONIOVISC) 2.5 % ophthalmic solution Place 1-2 drops into both eyes as needed for dry eyes.  . [DISCONTINUED] sitaGLIPtin (JANUVIA) 100 MG tablet Take 100 mg by mouth daily.     Allergies:   Adhesive [tape], Cyclobenzaprine, Cymbalta [duloxetine hcl], and Morphine   Social History   Socioeconomic History  . Marital status: Divorced    Spouse name: Not on file  . Number of children: 2  . Years of education: Not on file  . Highest education level: Bachelor's degree (e.g., BA, AB, BS)  Occupational History  . Not on file  Tobacco Use  . Smoking status: Never Smoker  . Smokeless tobacco: Never Used  Substance and Sexual Activity  . Alcohol use: Not Currently  . Drug use: Never  . Sexual activity: Not on file  Other Topics Concern  . Not on file  Social History Narrative   Pt is divorced   2 children   Right handed   Drinks soda, no coffee, no tea   Social Determinants of Health   Financial Resource Strain:   . Difficulty of Paying Living Expenses: Not on file  Food Insecurity:   . Worried About Charity fundraiser in the Last Year: Not on file  . Ran Out of Food in the Last Year: Not on file  Transportation Needs:   . Lack of Transportation (Medical): Not on file  . Lack of Transportation (Non-Medical): Not on file  Physical Activity:   . Days of Exercise per Week: Not on file  . Minutes of Exercise per Session: Not on file  Stress:   . Feeling of Stress : Not on file  Social Connections:   . Frequency of Communication with Friends and Family: Not on file  . Frequency of Social Gatherings with Friends and Family: Not on  file  . Attends Religious Services: Not on file  . Active Member of Clubs or Organizations: Not on file  . Attends Archivist Meetings: Not on file  . Marital Status: Not on file     Family History: The patient's family history includes CAD in his brother, father, and mother; Lung cancer in his father and mother.  ROS:   Please see the history of present illness.    All other systems reviewed and are negative.  EKGs/Labs/Other Studies Reviewed:    The following studies were reviewed today:  EKG:  Not performed today  Recent Labs: 04/16/2019: TSH 5.191 05/15/2019: Magnesium 2.0 11/22/2019: ALT 15 12/05/2019: BUN 21; Creatinine, Ser 1.33; Hemoglobin 12.1; Platelets 196; Potassium 4.2; Sodium 138  Recent Lipid Panel    Component Value Date/Time   CHOL 149 11/22/2019 2113   TRIG 387 (H) 11/22/2019 2113   HDL 25 (L) 11/22/2019 2113   CHOLHDL 6.0 11/22/2019 2113   VLDL 77 (H) 11/22/2019 2113   LDLCALC 47 11/22/2019 2113    Physical Exam:    VS:  BP 110/78   Pulse (!) 44   Temp (!) 97.2 F (36.2 C)   Ht 6' (1.829 m)   Wt 177 lb 6.4 oz (80.5 kg)   SpO2 92%   BMI 24.06 kg/m     Wt Readings from Last 5 Encounters:  01/21/20 177 lb 6.4 oz (80.5 kg)  12/05/19 188 lb (85.3 kg)  11/29/19 185 lb 6.4 oz (84.1 kg)  11/24/19 183 lb 13.8 oz (83.4 kg)  11/22/19 190 lb (86.2 kg)     Constitutional: No acute distress Eyes: sclera non-icteric, normal conjunctiva and lids ENMT: normal dentition, moist mucous membranes Cardiovascular: regular rhythm, normal rate, no murmurs. S1 and S2 normal. Radial pulses normal bilaterally. No jugular venous distention.  Respiratory: clear to auscultation bilaterally GI : normal bowel sounds, soft and nontender. No distention.   MSK: extremities warm, well perfused. No edema.  NEURO: grossly nonfocal exam, moves all extremities. PSYCH: alert and oriented x 3, normal mood and affect.   ASSESSMENT:    1. Coronary artery disease  involving native coronary artery of native heart with unstable angina pectoris (HCC)   2. Chest pain, unspecified type   3. Essential hypertension   4. HIV infection, unspecified symptom status ([place])   5. Hyperlipidemia, unspecified hyperlipidemia type   6. Type 2 diabetes mellitus with complication, without long-term current use of insulin (HCC)   7. Subdural hematoma (HCC)    PLAN:    Chest pain/moderate CAD-denies of ischemia on prior CT, however values were indeterminate.  Medical therapy going forward to avoid intervention if possible given patient's preference to not take aspirin.  He is however amenable to coronary angiography and PCI if indicated.  Would recommend repeat imaging of his head prior to any full strength anticoagulation or antiplatelet therapy to ensure resolution of the subdural hematoma.  Continue propranolol, continue Imdur 60 mg daily.  Imdur at 60 mg daily is the patient's preferred dose given symptom management.  Hypertension-continue propranolol, blood pressure below goal.  Hyperlipidemia-LDL is optimized on statin therapy. He however has elevated triglycerides. I will discuss lipid lowering therapy with our lipid clinic and provide recommendations to the patient. We can then discuss medication therapy. Continue to optimize lifestyle and avoid refined carbohydrates and alcohol as these can increase triglycerides.   Total time of encounter: 30 minutes total time of encounter, including 20 minutes spent in face-to-face patient care. This time includes coordination of care and counseling regarding above issues. Remainder of non-face-to-face time involved reviewing chart documents/testing relevant to the patient encounter and documentation in the medical record.  Cherlynn Kaiser, MD [place]  [facility]   Medication Adjustments/Labs and Tests Ordered: Current medicines are reviewed at length with the patient today.  Concerns regarding medicines are outlined  above.  No orders of the defined types were placed in this encounter.  No orders of the defined types were placed in this encounter.   There are no Patient Instructions on file for this visit.

## 2020-01-21 NOTE — Patient Instructions (Signed)
Medication Instructions:  NO CHANGES *If you need a refill on your cardiac medications before your next appointment, please call your pharmacy*  Lab Work: NOT NEEDED   Testing/Procedures: NOT NEEDED  Follow-Up: At [facility], you and your health needs are our priority.  As part of our continuing mission to provide you with exceptional heart care, we have created designated Provider Care Teams.  These Care Teams include your primary Cardiologist (physician) and Advanced Practice Providers (APPs -  Physician Assistants and Nurse Practitioners) who all work together to provide you with the care you need, when you need it.  Your next appointment:   6 month(s)  The format for your next appointment:   In Person  Provider:   Jory Sims, DNP, ANP  Other Instructions N/A

## 2020-02-01 ENCOUNTER — Ambulatory Visit: Payer: Medicare Other

## 2020-02-11 NOTE — Progress Notes (Signed)
I don't think the generic Vascepa is available quite yet.  Would recommend to start with generic Lovaza, as that is usually not cost prohibitive to Medicare patients

## 2020-02-12 ENCOUNTER — Emergency Department (HOSPITAL_BASED_OUTPATIENT_CLINIC_OR_DEPARTMENT_OTHER): Payer: Medicare Other

## 2020-02-12 ENCOUNTER — Other Ambulatory Visit: Payer: Self-pay

## 2020-02-12 ENCOUNTER — Emergency Department (HOSPITAL_BASED_OUTPATIENT_CLINIC_OR_DEPARTMENT_OTHER)
Admission: EM | Admit: 2020-02-12 | Discharge: 2020-02-12 | Disposition: A | Discharge status: Home / Self Care | Payer: Medicare Other | Attending: Emergency Medicine | Admitting: Emergency Medicine

## 2020-02-12 ENCOUNTER — Encounter (HOSPITAL_BASED_OUTPATIENT_CLINIC_OR_DEPARTMENT_OTHER): Payer: Self-pay | Admitting: *Deleted

## 2020-02-12 DIAGNOSIS — U071 COVID-19: Secondary | ICD-10-CM | POA: Insufficient documentation

## 2020-02-12 DIAGNOSIS — R0602 Shortness of breath: Secondary | ICD-10-CM | POA: Insufficient documentation

## 2020-02-12 DIAGNOSIS — E1142 Type 2 diabetes mellitus with diabetic polyneuropathy: Secondary | ICD-10-CM | POA: Diagnosis not present

## 2020-02-12 DIAGNOSIS — Z79899 Other long term (current) drug therapy: Secondary | ICD-10-CM | POA: Insufficient documentation

## 2020-02-12 DIAGNOSIS — R0789 Other chest pain: Secondary | ICD-10-CM | POA: Insufficient documentation

## 2020-02-12 DIAGNOSIS — Z21 Asymptomatic human immunodeficiency virus [HIV] infection status: Secondary | ICD-10-CM | POA: Insufficient documentation

## 2020-02-12 DIAGNOSIS — I10 Essential (primary) hypertension: Secondary | ICD-10-CM | POA: Diagnosis not present

## 2020-02-12 DIAGNOSIS — Z7984 Long term (current) use of oral hypoglycemic drugs: Secondary | ICD-10-CM | POA: Diagnosis not present

## 2020-02-12 DIAGNOSIS — R05 Cough: Secondary | ICD-10-CM | POA: Diagnosis present

## 2020-02-12 LAB — COMPREHENSIVE METABOLIC PANEL
ALT: 13 U/L (ref 0–44)
AST: 14 U/L — ABNORMAL LOW (ref 15–41)
Albumin: 3.7 g/dL (ref 3.5–5.0)
Alkaline Phosphatase: 91 U/L (ref 38–126)
Anion gap: 11 (ref 5–15)
BUN: 18 mg/dL (ref 8–23)
CO2: 20 mmol/L — ABNORMAL LOW (ref 22–32)
Calcium: 8.9 mg/dL (ref 8.9–10.3)
Chloride: 105 mmol/L (ref 98–111)
Creatinine, Ser: 1.35 mg/dL — ABNORMAL HIGH (ref 0.61–1.24)
GFR calc Af Amer: >60 mL/min (ref >60)
GFR calc non Af Amer: 54 mL/min — ABNORMAL LOW (ref >60)
Glucose, Bld: 106 mg/dL — ABNORMAL HIGH (ref 70–99)
Potassium: 3.9 mmol/L (ref 3.5–5.1)
Sodium: 136 mmol/L (ref 135–145)
Total Bilirubin: 0.7 mg/dL (ref 0.3–1.2)
Total Protein: 7.4 g/dL (ref 6.5–8.1)

## 2020-02-12 LAB — CBC WITH DIFFERENTIAL/PLATELET
Abs Immature Granulocytes: 0.03 10*3/uL (ref 0.00–0.07)
Basophils Absolute: 0 10*3/uL (ref 0.0–0.1)
Basophils Relative: 0 %
Eosinophils Absolute: 0.1 10*3/uL (ref 0.0–0.5)
Eosinophils Relative: 1 %
HCT: 39.6 % (ref 39.0–52.0)
Hemoglobin: 13.3 g/dL (ref 13.0–17.0)
Immature Granulocytes: 1 %
Lymphocytes Relative: 14 %
Lymphs Abs: 0.9 10*3/uL (ref 0.7–4.0)
MCH: 32.5 pg (ref 26.0–34.0)
MCHC: 33.6 g/dL (ref 30.0–36.0)
MCV: 96.8 fL (ref 80.0–100.0)
Monocytes Absolute: 1 10*3/uL (ref 0.1–1.0)
Monocytes Relative: 15 %
Neutro Abs: 4.6 10*3/uL (ref 1.7–7.7)
Neutrophils Relative %: 69 %
Platelets: 148 10*3/uL — ABNORMAL LOW (ref 150–400)
RBC: 4.09 MIL/uL — ABNORMAL LOW (ref 4.22–5.81)
RDW: 13.3 % (ref 11.5–15.5)
WBC: 6.6 10*3/uL (ref 4.0–10.5)
nRBC: 0 % (ref 0.0–0.2)

## 2020-02-12 LAB — LACTIC ACID, PLASMA: Lactic Acid, Venous: 1.2 mmol/L (ref 0.5–1.9)

## 2020-02-12 LAB — C-REACTIVE PROTEIN: CRP: 17.9 mg/dL — ABNORMAL HIGH (ref <1.0)

## 2020-02-12 LAB — D-DIMER, QUANTITATIVE: D-Dimer, Quant: 0.57 ug/mL-FEU — ABNORMAL HIGH (ref 0.00–0.50)

## 2020-02-12 LAB — FERRITIN: Ferritin: 284 ng/mL (ref 24–336)

## 2020-02-12 LAB — FIBRINOGEN: Fibrinogen: 661 mg/dL — ABNORMAL HIGH (ref 210–475)

## 2020-02-12 LAB — TRIGLYCERIDES: Triglycerides: 133 mg/dL (ref <150)

## 2020-02-12 LAB — LACTATE DEHYDROGENASE: LDH: 173 U/L (ref 98–192)

## 2020-02-12 LAB — PROCALCITONIN: Procalcitonin: 0.11 ng/mL

## 2020-02-12 LAB — SARS CORONAVIRUS 2 AG (30 MIN TAT): SARS Coronavirus 2 Ag: POSITIVE — AB

## 2020-02-12 MED ORDER — SODIUM CHLORIDE 0.9 % IV SOLN: INTRAVENOUS | Status: DC

## 2020-02-12 MED ORDER — LOPERAMIDE HCL 2 MG PO TABS: 2.0000 mg | ORAL_TABLET | Freq: Four times a day (QID) | ORAL | 0 refills | Status: DC | PRN

## 2020-02-12 NOTE — Discharge Instructions (Signed)
Self isolate at home.  Continue to take your current medications.  Continue over-the-counter cough medicines.  Have provided a prescription for Imodium to take for the diarrhea.  But that is available over-the-counter as well.  Notify your infectious disease specialist and your primary care doctor about your positive Covid test.  No evidence of pneumonia here today.  And oxygen levels have been fine.

## 2020-02-12 NOTE — ED Triage Notes (Signed)
Cough, sob and chest tightness for a week. Covid test was negative a month ago.

## 2020-02-12 NOTE — ED Provider Notes (Signed)
[facility] EMERGENCY DEPARTMENT Provider Note   CSN: [id number] Arrival date & time: 02/12/20  1731     History Chief Complaint  Patient presents with  . Cough  . Shortness of Breath    Brian Malone is a 69 y.o. male.  Patient with complaint of diarrhea quite frequent no blood.  No abdominal pain other than some mild cramping..  Cough shortness of breath chest tightness for a week.  Patient with a negative Covid test a month ago.  Patient has been followed by infectious disease at [facility] for HIV is taking antivirals and has been taking them.  Take an over-the-counter medicine for cough.  Past medical history significant for diabetes and hypertension.  Patient has not had any known COVID-19 contacts.  No fevers with it.  No loss of taste or smell.  Oxygen saturation is 98% on room air.  Afebrile here.        Past Medical History:  Diagnosis Date  . Anxiety   . Diabetes mellitus without complication ([place])   . HIV (human immunodeficiency virus infection) ([place])   . Hypertension   . Renal disorder     Patient Active Problem List   Diagnosis Date Noted  . Dizziness 12/26/2019  . Chest pain 11/22/2019  . Acute subdural hematoma ([place]) 10/28/2019  . Subdural hematoma ([place]) 10/26/2019  . Dysphagia 06/20/2019  . AKI (acute kidney injury) ([place]) 06/20/2019  . HCAP (healthcare-associated pneumonia) 06/20/2019  . Normal anion gap metabolic acidosis [id number]  . ARF (acute renal failure) ([place]) 06/19/2019  . Cervical spinal stenosis 05/18/2019  . Lumbar spondylosis 05/18/2019  . Sacroiliitis ([place]) 05/15/2019  . Right foot ulcer ([place]) 05/13/2019  . HIV (human immunodeficiency virus infection) ([place]) 05/13/2019  . Tachycardia 04/16/2019  . Diabetes ([place]) 04/16/2019  . Cellulitis 04/16/2019  . Wound infection after surgery 04/15/2019  . Benign prostatic hyperplasia (BPH) with straining on urination 02/22/2019  . Essential hypertension 02/22/2019  . Post-traumatic  osteoarthritis of both ankles 02/22/2019  . Trigeminal neuralgia 02/22/2019  . Uncontrolled type 2 diabetes mellitus with hyperglycemia ([place]) 02/22/2019  . Chronic bilateral low back pain with bilateral sciatica 01/19/2019  . Diabetic autonomic neuropathy associated with type 2 diabetes mellitus ([place]) 01/19/2019  . Migraine with aura and without status migrainosus, not intractable 01/19/2019  . Neck pain 01/19/2019  . Neuropathy of both feet 01/19/2019  . Nocturia more than twice per night 01/07/2015  . Anxiety 10/23/2014  . Candida, oral 10/23/2014  . Depression 10/23/2014  . Insomnia 10/23/2014  . Rash of entire body 10/23/2014    Past Surgical History:  Procedure Laterality Date  . ANKLE ARTHROSCOPY    . APPENDECTOMY    . BACK SURGERY     FUSION  . THORACOTOMY Right 2008  ?  . TONSILLECTOMY         Family History  Problem Relation Age of Onset  . CAD Mother   . Lung cancer Mother   . CAD Father   . Lung cancer Father   . CAD Brother     Social History   Tobacco Use  . Smoking status: Never Smoker  . Smokeless tobacco: Never Used  Substance Use Topics  . Alcohol use: Not Currently  . Drug use: Never    Home Medications Prior to Admission medications   Medication Sig Start Date End Date Taking? Authorizing Provider  acetaminophen (TYLENOL) 500 MG tablet Take 1,000 mg by mouth 3 (three) times daily.     [provider]  Ascorbic Acid (VITAMIN C) 1000 MG tablet Take 1,000 mg by mouth every other day.    [provider]  busPIRone (BUSPAR) 10 MG tablet Take 10 mg by mouth 3 (three) times daily.  01/22/19   [provider]  cetirizine (ZYRTEC) 10 MG tablet Take 10 mg by mouth daily.    [provider]  clonazePAM (KLONOPIN) 0.5 MG tablet Take 0.5 mg by mouth 3 (three) times daily. 10/01/19   [provider]  colchicine 0.6 MG tablet Take 0.6 mg by mouth daily as needed (as directed for gout flares).    [provider]  Cyanocobalamin (VITAMIN B-12) 5000 MCG TBDP Take 5,000 mcg by mouth daily.     [provider]  Erenumab-aooe (AIMOVIG) 70 MG/ML SOAJ Inject 70 mg into the skin every 30 (thirty) days. 11/22/19   Pieter Partridge, DO  ezetimibe (ZETIA) 10 MG tablet Take 10 mg by mouth daily.    [provider]  gabapentin (NEURONTIN) 300 MG capsule Take 2 capsules (600 mg total) by mouth 3 (three) times daily. 11/24/19   Geradine Girt, DO  isosorbide mononitrate (IMDUR) 60 MG 24 hr tablet Take 1 tablet (60 mg total) by mouth daily. 11/29/19 02/27/20  Elouise Munroe, MD  loperamide (IMODIUM A-D) 2 MG tablet Take 1 tablet (2 mg total) by mouth 4 (four) times daily as needed for diarrhea or loose stools. 02/12/20   Fredia Sorrow, MD  OZEMPIC, 0.25 OR 0.5 MG/DOSE, 2 MG/1.5ML SOPN Inject 0.5 mg into the skin once a week. Taking on Friday 11/14/19   [provider]  pantoprazole (PROTONIX) 40 MG tablet Take 40 mg by mouth daily before breakfast.  02/12/19   [provider]  propranolol ER (INDERAL LA) 80 MG 24 hr capsule Take 80 mg by mouth daily.  02/12/19   [provider]  rizatriptan (MAXALT) 10 MG tablet Take 10 mg by mouth as needed for migraine.  03/21/19   [provider]  tamsulosin (FLOMAX) 0.4 MG CAPS capsule Take 0.4 mg by mouth at bedtime.  02/12/19   [provider]  tiZANidine (ZANAFLEX) 4 MG tablet Take 4 mg by mouth at bedtime.  03/08/19   [provider]  topiramate (TOPAMAX) 50 MG tablet Take 50 mg by mouth 2 (two) times daily.  03/21/19   [provider]  TRIUMEQ 600-50-300 MG tablet Take 1 tablet by mouth daily.  02/12/19   [provider]  valACYclovir (VALTREX) 1000 MG tablet Take 1,000 mg by mouth 3 (three) times daily as needed (as directed for trigeminal neuralgia flares). Trig neuralgia flare 10/16/19   [provider]  Vitamin D, Ergocalciferol, (DRISDOL) 1.25 MG (50000 UT) CAPS capsule  Take 50,000 Units by mouth every Thursday.  03/21/19   [provider]    Allergies    Adhesive [tape], Cyclobenzaprine, Cymbalta [duloxetine hcl], and Morphine  Review of Systems   Review of Systems  Constitutional: Negative for chills and fever.  HENT: Negative for congestion, rhinorrhea and sore throat.   Eyes: Negative for visual disturbance.  Respiratory: Positive for shortness of breath. Negative for cough.   Cardiovascular: Negative for chest pain and leg swelling.  Gastrointestinal: Positive for diarrhea. Negative for abdominal pain, nausea and vomiting.  Genitourinary: Negative for dysuria.  Musculoskeletal: Negative for back pain and neck pain.  Skin: Negative for rash.  Neurological: Negative for dizziness, light-headedness and headaches.  Hematological: Does not bruise/bleed easily.  Psychiatric/Behavioral: Negative for  confusion.    Physical Exam Updated Vital Signs BP 136/83   Pulse 70   Temp 98.3 F (36.8 C) (Oral)   Resp 16   Ht 1.829 m (6')   Wt 81.6 kg   SpO2 95%   BMI 24.41 kg/m   Physical Exam Vitals and nursing note reviewed.  Constitutional:      General: He is not in acute distress.    Appearance: Normal appearance. He is well-developed.  HENT:     Head: Normocephalic and atraumatic.  Eyes:     Extraocular Movements: Extraocular movements intact.     Conjunctiva/sclera: Conjunctivae normal.     Pupils: Pupils are equal, round, and reactive to light.  Cardiovascular:     Rate and Rhythm: Normal rate and regular rhythm.     Heart sounds: No murmur.  Pulmonary:     Effort: Pulmonary effort is normal. No respiratory distress.     Breath sounds: Normal breath sounds.  Abdominal:     Palpations: Abdomen is soft.     Tenderness: There is no abdominal tenderness.  Musculoskeletal:        General: Normal range of motion.     Cervical back: Neck supple.  Skin:    General: Skin is warm and dry.  Neurological:     General: No focal  deficit present.     Mental Status: He is alert and oriented to person, place, and time.     ED Results / Procedures / Treatments   Labs (all labs ordered are listed, but only abnormal results are displayed) Labs Reviewed  SARS CORONAVIRUS 2 AG (30 MIN TAT) - Abnormal; Notable for the following components:      Result Value   SARS Coronavirus 2 Ag POSITIVE (*)    All other components within normal limits  CBC WITH DIFFERENTIAL/PLATELET - Abnormal; Notable for the following components:   RBC 4.09 (*)    Platelets 148 (*)    All other components within normal limits  COMPREHENSIVE METABOLIC PANEL - Abnormal; Notable for the following components:   CO2 20 (*)    Glucose, Bld 106 (*)    Creatinine, Ser 1.35 (*)    AST 14 (*)    GFR calc non Af Amer 54 (*)    All other components within normal limits  D-DIMER, QUANTITATIVE (NOT AT [facility]) - Abnormal; Notable for the following components:   D-Dimer, Quant 0.57 (*)    All other components within normal limits  FIBRINOGEN - Abnormal; Notable for the following components:   Fibrinogen 661 (*)    All other components within normal limits  C-REACTIVE PROTEIN - Abnormal; Notable for the following components:   CRP 17.9 (*)    All other components within normal limits  CULTURE, BLOOD (ROUTINE X 2)  CULTURE, BLOOD (ROUTINE X 2)  LACTIC ACID, PLASMA  PROCALCITONIN  LACTATE DEHYDROGENASE  FERRITIN  TRIGLYCERIDES    EKG EKG Interpretation  Date/Time:  Tuesday February 12 2020 17:49:49 EST Ventricular Rate:  97 PR Interval:    QRS Duration: 93 QT Interval:  347 QTC Calculation: 441 R Axis:   37 Text Interpretation: Sinus rhythm Minimal ST depression, anterolateral leads Confirmed by Fredia Sorrow [phone]) on 02/12/2020 6:07:46 PM   Radiology DG Chest Port 1 View  Result Date: 02/12/2020 CLINICAL DATA:  Cough and shortness of breath EXAM: PORTABLE CHEST 1 VIEW COMPARISON:  November 22, 2019 FINDINGS: The heart size and  mediastinal contours are within normal limits. Both lungs are clear.  The visualized skeletal structures are unremarkable. Cervical fixation hardware seen. IMPRESSION: No active disease. Electronically Signed   By: Prudencio Pair M.D.   On: 02/12/2020 18:51    Procedures Procedures (including critical care time)  Medications Ordered in ED Medications  0.9 %  sodium chloride infusion ( Intravenous New Bag/Given 02/12/20 1933)    ED Course  I have reviewed the triage vital signs and the nursing notes.  Pertinent labs & imaging results that were available during my care of the patient were reviewed by me and considered in my medical decision making (see chart for details).    MDM Rules/Calculators/A&P                      Covid test positive here.  Oxygen saturations been good chest x-ray shows no evidence of pneumonia at this point in time.  Which is reassuring.  Would recommend over-the-counter Imodium or prescription Imodium for the diarrhea.  Continue over-the-counter medicines for cough.   Patient will contact his infectious disease specialist over his diagnosis.  As well as primary care doctor.  Patient in no acute distress here tonight.  But will need to be followed carefully.  He will return for any new or worse symptoms.     Final Clinical Impression(s) / ED Diagnoses Final diagnoses:  COVID-19 virus infection    Rx / DC Orders ED Discharge Orders         Ordered    loperamide (IMODIUM A-D) 2 MG tablet  4 times daily PRN     02/12/20 2313           Fredia Sorrow, MD 02/12/20 2320

## 2020-02-13 ENCOUNTER — Other Ambulatory Visit: Payer: Self-pay | Admitting: Unknown Physician Specialty

## 2020-02-13 ENCOUNTER — Telehealth: Payer: Self-pay | Admitting: Unknown Physician Specialty

## 2020-02-13 DIAGNOSIS — Z21 Asymptomatic human immunodeficiency virus [HIV] infection status: Secondary | ICD-10-CM

## 2020-02-13 DIAGNOSIS — U071 COVID-19: Secondary | ICD-10-CM

## 2020-02-13 DIAGNOSIS — E1122 Type 2 diabetes mellitus with diabetic chronic kidney disease: Secondary | ICD-10-CM

## 2020-02-13 NOTE — Telephone Encounter (Signed)
  I connected by phone with Brian Malone on 02/13/2020 at 8:03 AM to discuss the potential use of an new treatment for mild to moderate COVID-19 viral infection in non-hospitalized patients.  This patient is a 69 y.o. male that meets the FDA criteria for Emergency Use Authorization of bamlanivimab or casirivimab\imdevimab.  Has a (+) direct SARS-CoV-2 viral test result  Has mild or moderate COVID-19   Is ? 67 years of age and weighs ? 40 kg  Is NOT hospitalized due to COVID-19  Is NOT requiring oxygen therapy or requiring an increase in baseline oxygen flow rate due to COVID-19  Is within 10 days of symptom onset  Has at least one of the high risk factor(s) for progression to severe COVID-19 and/or hospitalization as defined in EUA.  Specific high risk criteria : >/= 67 yo with multiple co morbid conditions   I have spoken and communicated the following to the patient or parent/caregiver:  1. FDA has authorized the emergency use of bamlanivimab and casirivimab\imdevimab for the treatment of mild to moderate COVID-19 in adults and pediatric patients with positive results of direct SARS-CoV-2 viral testing who are 41 years of age and older weighing at least 40 kg, and who are at high risk for progressing to severe COVID-19 and/or hospitalization.  2. The significant known and potential risks and benefits of bamlanivimab and casirivimab\imdevimab, and the extent to which such potential risks and benefits are unknown.  3. Information on available alternative treatments and the risks and benefits of those alternatives, including clinical trials.  4. Patients treated with bamlanivimab and casirivimab\imdevimab should continue to self-isolate and use infection control measures (e.g., wear mask, isolate, social distance, avoid sharing personal items, clean and disinfect "high touch" surfaces, and frequent handwashing) according to CDC guidelines.   5. The patient or parent/caregiver has  the option to accept or refuse bamlanivimab or casirivimab\imdevimab .  After reviewing this information with the patient, The patient agreed to proceed with receiving the bamlanimivab infusion and will be provided a copy of the Fact sheet prior to receiving the infusion.Kathrine Haddock 02/13/2020 8:03 AM  Day 8 or 9 today

## 2020-02-14 ENCOUNTER — Ambulatory Visit (HOSPITAL_COMMUNITY)
Admission: RE | Admit: 2020-02-14 | Discharge: 2020-02-14 | Disposition: A | Discharge status: Home / Self Care | Payer: Medicare Other | Source: Ambulatory Visit | Attending: Pulmonary Disease | Admitting: Pulmonary Disease

## 2020-02-14 DIAGNOSIS — Z23 Encounter for immunization: Secondary | ICD-10-CM | POA: Insufficient documentation

## 2020-02-14 DIAGNOSIS — U071 COVID-19: Secondary | ICD-10-CM | POA: Diagnosis present

## 2020-02-14 DIAGNOSIS — E1122 Type 2 diabetes mellitus with diabetic chronic kidney disease: Secondary | ICD-10-CM

## 2020-02-14 DIAGNOSIS — Z21 Asymptomatic human immunodeficiency virus [HIV] infection status: Secondary | ICD-10-CM | POA: Insufficient documentation

## 2020-02-14 DIAGNOSIS — N1832 Chronic kidney disease, stage 3b: Secondary | ICD-10-CM | POA: Diagnosis present

## 2020-02-14 DIAGNOSIS — E1121 Type 2 diabetes mellitus with diabetic nephropathy: Secondary | ICD-10-CM | POA: Insufficient documentation

## 2020-02-14 MED ORDER — ALBUTEROL SULFATE HFA 108 (90 BASE) MCG/ACT IN AERS: 2.0000 | INHALATION_SPRAY | Freq: Once | RESPIRATORY_TRACT | Status: DC | PRN

## 2020-02-14 MED ORDER — METHYLPREDNISOLONE SODIUM SUCC 125 MG IJ SOLR: 125.0000 mg | Freq: Once | INTRAMUSCULAR | Status: DC | PRN

## 2020-02-14 MED ORDER — SODIUM CHLORIDE 0.9 % IV SOLN
700.0000 mg | Freq: Once | INTRAVENOUS | Status: AC
  Administered 2020-02-14: 700 mg via INTRAVENOUS
  Filled 2020-02-14: qty 20

## 2020-02-14 MED ORDER — EPINEPHRINE 0.3 MG/0.3ML IJ SOAJ: 0.3000 mg | Freq: Once | INTRAMUSCULAR | Status: DC | PRN

## 2020-02-14 MED ORDER — DIPHENHYDRAMINE HCL 50 MG/ML IJ SOLN: 50.0000 mg | Freq: Once | INTRAMUSCULAR | Status: DC | PRN

## 2020-02-14 MED ORDER — SODIUM CHLORIDE 0.9 % IV SOLN
INTRAVENOUS | Status: DC | PRN
  Administered 2020-02-14: 250 mL via INTRAVENOUS

## 2020-02-14 MED ORDER — FAMOTIDINE IN NACL 20-0.9 MG/50ML-% IV SOLN: 20.0000 mg | Freq: Once | INTRAVENOUS | Status: DC | PRN

## 2020-02-14 NOTE — Discharge Instructions (Signed)

## 2020-02-14 NOTE — Progress Notes (Signed)
  Diagnosis: COVID-19  Physician: Dr. Asencion Noble  Procedure: [facility] Infusion [facility] Med: bamlanivimab infusion - Provided patient with bamlanimivab fact sheet for patients, parents and caregivers prior to infusion.  Complications: No immediate complications noted.  Discharge: Discharged home   Gaye Alken 02/14/2020

## 2020-02-17 LAB — CULTURE, BLOOD (ROUTINE X 2): Culture: NO GROWTH

## 2020-03-28 ENCOUNTER — Ambulatory Visit: Payer: Medicare Other | Admitting: Neurology

## 2020-04-19 HISTORY — PX: PENILE PROSTHESIS PLACEMENT: SHX739

## 2020-05-12 ENCOUNTER — Other Ambulatory Visit: Payer: Self-pay | Admitting: Internal Medicine

## 2020-05-12 DIAGNOSIS — L03031 Cellulitis of right toe: Secondary | ICD-10-CM

## 2020-05-20 HISTORY — PX: PENILE PROSTHESIS  REMOVAL: SHX2202

## 2020-05-26 ENCOUNTER — Telehealth: Payer: Medicare Other | Admitting: Adult Health

## 2020-05-26 ENCOUNTER — Encounter: Payer: Self-pay | Admitting: Cardiology

## 2020-05-26 ENCOUNTER — Telehealth (INDEPENDENT_AMBULATORY_CARE_PROVIDER_SITE_OTHER): Payer: Medicare Other | Admitting: Cardiology

## 2020-05-26 VITALS — Ht 72.0 in

## 2020-05-26 DIAGNOSIS — I25110 Atherosclerotic heart disease of native coronary artery with unstable angina pectoris: Secondary | ICD-10-CM | POA: Diagnosis not present

## 2020-05-26 DIAGNOSIS — N183 Chronic kidney disease, stage 3 unspecified: Secondary | ICD-10-CM

## 2020-05-26 DIAGNOSIS — I2510 Atherosclerotic heart disease of native coronary artery without angina pectoris: Secondary | ICD-10-CM

## 2020-05-26 DIAGNOSIS — R000 Tachycardia, unspecified: Secondary | ICD-10-CM | POA: Diagnosis not present

## 2020-05-26 DIAGNOSIS — Z8616 Personal history of COVID-19: Secondary | ICD-10-CM

## 2020-05-26 DIAGNOSIS — I959 Hypotension, unspecified: Secondary | ICD-10-CM

## 2020-05-26 HISTORY — DX: Personal history of COVID-19: Z86.16

## 2020-05-26 NOTE — Patient Instructions (Signed)
Medication Instructions:  Your physician recommends that you continue on your current medications as directed. Please refer to the Current Medication list given to you today.  *If you need a refill on your cardiac medications before your next appointment, please call your pharmacy*   Follow-Up: At [facility], you and your health needs are our priority.  As part of our continuing mission to provide you with exceptional heart care, we have created designated Provider Care Teams.  These Care Teams include your primary Cardiologist (physician) and Advanced Practice Providers (APPs -  Physician Assistants and Nurse Practitioners) who all work together to provide you with the care you need, when you need it.  We recommend signing up for the patient portal called "MyChart".  Sign up information is provided on this After Visit Summary.  MyChart is used to connect with patients for Virtual Visits (Telemedicine).  Patients are able to view lab/test results, encounter notes, upcoming appointments, etc.  Non-urgent messages can be sent to your provider as well.   To learn more about what you can do with MyChart, go to [email or web address].    Your next appointment:   Friday, 05/30/20 at 2:20 PM  The format for your next appointment:   In Person  Provider:   Cherlynn Kaiser, MD

## 2020-05-26 NOTE — Progress Notes (Signed)
Virtual Visit via Telephone Note   This visit type was conducted due to national recommendations for restrictions regarding the COVID-19 Pandemic (e.g. social distancing) in an effort to limit this patient's exposure and mitigate transmission in our community.  Due to his co-morbid illnesses, this patient is at least at moderate risk for complications without adequate follow up.  This format is felt to be most appropriate for this patient at this time.  The patient did not have access to video technology/had technical difficulties with video requiring transitioning to audio format only (telephone).  All issues noted in this document were discussed and addressed.  No physical exam could be performed with this format.  Please refer to the patient's chart for his  consent to telehealth for [facility].   The patient was identified using 2 identifiers.  Date:  05/26/2020   ID:  Brian Malone, DOB 22-Jul-1951, MRN [id number]  Patient Location: Home Provider Location: Home  PCP:  [name], [name], MD  Cardiologist:  Elouise Munroe, MD  Electrophysiologist:  None   Evaluation Performed:  Follow-Up Visit  Chief Complaint:  Weakness, tachycardia  History of Present Illness:    Brian Malone is a 69 y.o. male with a history of moderate coronary disease by CTA, treated medically, diabetes, HIV, hypertension, chronic renal insufficiency stage III, dyslipidemia on Zetia secondary to HIV medications, and a history of subdural hematoma November 2020, he is not on aspirin because of that.  The patient was contacted today for routine follow-up.  He was seen in the office 02/10/2020.  He was doing well at that time on Imdur.  He then presented to the emergency room 2 days later with Covid.  He was treated as an outpatient.  He says since this he has had fatigue and dyspnea on exertion although it is gradually getting better.  He says he is lost 20 pounds since he had Covid.  He also had a  penile implant done in May at [facility].  This apparently got infected with MRSA any was treated with outpatient antibiotics.  He was seen by his PCP recently and told that his blood pressure was low and he was taken off propanolol.  He says his resting heart rate is gone up, he is unable to calculate his pulse but he feels like it is greater than 100.  He is not had syncope.  He continues to deny having chest pain, he does have shortness of breath with exertion.  The patient does not have symptoms concerning for COVID-19 infection (fever, chills, cough, or new shortness of breath).    Past Medical History:  Diagnosis Date  . Anxiety   . Diabetes mellitus without complication ([place])   . HIV (human immunodeficiency virus infection) ([place])   . Hypertension   . Renal disorder    Past Surgical History:  Procedure Laterality Date  . ANKLE ARTHROSCOPY    . APPENDECTOMY    . BACK SURGERY     FUSION  . THORACOTOMY Right 2008  ?  . TONSILLECTOMY       Current Meds  Medication Sig  . acetaminophen (TYLENOL) 500 MG tablet Take 1,000 mg by mouth 3 (three) times daily.   . busPIRone (BUSPAR) 10 MG tablet Take 10 mg by mouth 3 (three) times daily.   . cetirizine (ZYRTEC) 10 MG tablet Take 10 mg by mouth daily.  . clonazePAM (KLONOPIN) 0.5 MG tablet Take 0.5 mg by mouth 3 (three) times daily.  Marland Kitchen  colchicine 0.6 MG tablet Take 0.6 mg by mouth daily as needed (as directed for gout flares).  . Cyanocobalamin (VITAMIN B-12) 5000 MCG TBDP Take 5,000 mcg by mouth daily.   Eduard Roux (AIMOVIG) 70 MG/ML SOAJ Inject 70 mg into the skin every 30 (thirty) days.  Marland Kitchen ezetimibe (ZETIA) 10 MG tablet Take 10 mg by mouth daily.  Marland Kitchen gabapentin (NEURONTIN) 300 MG capsule Take 2 capsules (600 mg total) by mouth 3 (three) times daily.  Marland Kitchen loperamide (IMODIUM A-D) 2 MG tablet Take 1 tablet (2 mg total) by mouth 4 (four) times daily as needed for diarrhea or loose stools.  Marland Kitchen OZEMPIC, 0.25 OR 0.5 MG/DOSE, 2 MG/1.5ML  SOPN Inject 0.5 mg into the skin once a week. Taking on Friday  . pantoprazole (PROTONIX) 40 MG tablet Take 40 mg by mouth daily before breakfast.   . propranolol ER (INDERAL LA) 80 MG 24 hr capsule Take 80 mg by mouth daily.   . rizatriptan (MAXALT) 10 MG tablet Take 10 mg by mouth as needed for migraine.   . sulfamethoxazole-trimethoprim (BACTRIM DS) 800-160 MG tablet Take 1 tablet by mouth daily. For 14 days  . tamsulosin (FLOMAX) 0.4 MG CAPS capsule Take 0.4 mg by mouth at bedtime.   Marland Kitchen tiZANidine (ZANAFLEX) 4 MG tablet Take 4 mg by mouth at bedtime.   . topiramate (TOPAMAX) 50 MG tablet Take 50 mg by mouth 2 (two) times daily.   . TRIUMEQ 600-50-300 MG tablet Take 1 tablet by mouth daily.   . valACYclovir (VALTREX) 1000 MG tablet Take 1,000 mg by mouth 3 (three) times daily as needed (as directed for trigeminal neuralgia flares). Trig neuralgia flare  . Vitamin D, Ergocalciferol, (DRISDOL) 1.25 MG (50000 UT) CAPS capsule Take 50,000 Units by mouth every Thursday.      Allergies:   Adhesive [tape], Cyclobenzaprine, Cymbalta [duloxetine hcl], and Morphine   Social History   Tobacco Use  . Smoking status: Never Smoker  . Smokeless tobacco: Never Used  Substance Use Topics  . Alcohol use: Not Currently  . Drug use: Never     Family Hx: The patient's family history includes CAD in his brother, father, and mother; Lung cancer in his father and mother.  ROS:   Please see the history of present illness.    All other systems reviewed and are negative.   Prior CV studies:   The following studies were reviewed today: Echo 11/23/2019- IMPRESSIONS    1. Left ventricular ejection fraction, by visual estimation, is 50 to  55%. The left ventricle has normal function. There is no left ventricular  hypertrophy.  2. Global right ventricle has normal systolic function.The right  ventricular size is normal. No increase in right ventricular wall  thickness.  3. Left atrial size was  normal.  4. Right atrial size was mildly dilated.  5. The mitral valve is normal in structure. No evidence of mitral valve  regurgitation.  6. The tricuspid valve is normal in structure. Tricuspid valve  regurgitation is not demonstrated.  7. The aortic valve is grossly normal. Aortic valve regurgitation is  mild.  8. The pulmonic valve was normal in structure. Pulmonic valve  regurgitation is not visualized.  9. The atrial septum is grossly normal.    Labs/Other Tests and Data Reviewed:    EKG:  An ECG dated 02/14/2020 was personally reviewed today and demonstrated:  NSR-HR 97, NSST changes  Recent Labs: 02/12/2020: ALT 13; BUN 18; Creatinine, Ser 1.35; Hemoglobin 13.3; Platelets 148;  Potassium 3.9; Sodium 136   Recent Lipid Panel Lab Results  Component Value Date/Time   CHOL 149 11/22/2019 09:13 PM   TRIG 133 02/12/2020 07:28 PM   HDL 25 (L) 11/22/2019 09:13 PM   CHOLHDL 6.0 11/22/2019 09:13 PM   LDLCALC 47 11/22/2019 09:13 PM    Wt Readings from Last 3 Encounters:  02/12/20 180 lb (81.6 kg)  01/21/20 177 lb 6.4 oz (80.5 kg)  12/05/19 188 lb (85.3 kg)     Objective:    Vital Signs:  Ht 6' (1.829 m)   BMI 24.41 kg/m    VITAL SIGNS:  reviewed  ASSESSMENT & PLAN:    Resting tachycardia- Suspect this is from recent COVID infection in Feb, psot op infection from his surgery in May, and beta blocker withdrawal.  I don't have access to his PCP records and I feel he should be seen in the office for B/P check and to consider resuming his beta blocker, possibly at a reduced dose.  CAD- Documented by CTA with indeterminate FFR- he is dong well on nitrate Rx.   COVID- Presented 2 days after his OV with Dr Alroy Dust in Feb.  Some of his DOE may be from this.  Consider echo if his symptoms don't improve or worsen.  H/O SDH- Nov 2020- he is not on aspirin secondary to this.  HIV- On treatment- immunosuppressed.   CRI-3 GFR 52  Plan: OV this week for further  evaluation.  COVID-19 Education: The signs and symptoms of COVID-19 were discussed with the patient and how to seek care for testing (follow up with PCP or arrange E-visit).  The importance of social distancing was discussed today.  Time:   Today, I have spent 20 minutes with the patient with telehealth technology discussing the above problems.     Medication Adjustments/Labs and Tests Ordered: Current medicines are reviewed at length with the patient today.  Concerns regarding medicines are outlined above.   Tests Ordered: No orders of the defined types were placed in this encounter.   Medication Changes: No orders of the defined types were placed in this encounter.   Follow Up:  In Person with Dr Geradine Girt or an APP  Signed, Kerin Ransom, PA-C  05/26/2020 10:01 AM    [place]

## 2020-05-30 ENCOUNTER — Other Ambulatory Visit: Payer: Self-pay

## 2020-05-30 ENCOUNTER — Ambulatory Visit (INDEPENDENT_AMBULATORY_CARE_PROVIDER_SITE_OTHER): Payer: Medicare Other | Admitting: Internal Medicine

## 2020-05-30 VITALS — BP 130/84 | HR 96 | Ht 72.0 in | Wt 160.8 lb

## 2020-05-30 DIAGNOSIS — Z8616 Personal history of COVID-19: Secondary | ICD-10-CM

## 2020-05-30 DIAGNOSIS — I2510 Atherosclerotic heart disease of native coronary artery without angina pectoris: Secondary | ICD-10-CM

## 2020-05-30 NOTE — Patient Instructions (Signed)
Medication Instructions:  No changes *If you need a refill on your cardiac medications before your next appointment, please call your pharmacy*   Lab Work: None If you have labs (blood work) drawn today and your tests are completely normal, you will receive your results only by: Marland Kitchen MyChart Message (if you have MyChart) OR . A paper copy in the mail If you have any lab test that is abnormal or we need to change your treatment, we will call you to review the results.   Testing/Procedures: None  Follow-Up: At [facility], you and your health needs are our priority.  As part of our continuing mission to provide you with exceptional heart care, we have created designated Provider Care Teams.  These Care Teams include your primary Cardiologist (physician) and Advanced Practice Providers (APPs -  Physician Assistants and Nurse Practitioners) who all work together to provide you with the care you need, when you need it.  Your next appointment:   6 month(s)  The format for your next appointment:   In Person  Provider:   You may see Elouise Munroe, MD or one of the following Advanced Practice Providers on your designated Care Team:    Rosaria Ferries, PA-C  Jory Sims, DNP, ANP  Cadence Kathlen Mody, NP

## 2020-05-30 NOTE — Progress Notes (Signed)
Cardiology Office Note:    Date:  05/30/2020   ID:  Brian Malone, DOB 1951-06-01, MRN [id number]  PCP:  [name], [name], MD  Cardiologist:  Elouise Munroe, MD  Electrophysiologist:  None   Referring MD: [name], [name], MD   Chief Complaint: CAD, hypotension and tachycardia  History of Present Illness:    Brian Malone is a 69 y.o. male with a history of diabetes, hypertension, HIV, hyperlipidemia, chronic kidney disease stage III.  Recent CT coronary angiography demonstrated moderate CAD with indeterminate FFR values.   He was diagnosed with COVID 19 just two days after our last office appointment. He also underwent penile implant at [facility] in May. His main concerns at our visit today are about persistent MRSA infection and discomfort at the site of the reservoir.   Mild DOE but main issues are with abdominal and pelvic discomfort. He is very concerned, and we discussed that if he is worried about infection he should consider presentation to the ED given immunodeficiency.   Past Medical History:  Diagnosis Date  . Anxiety   . Diabetes mellitus without complication ([place])   . HIV (human immunodeficiency virus infection) ([place])   . Hypertension   . Renal disorder     Past Surgical History:  Procedure Laterality Date  . ANKLE ARTHROSCOPY    . APPENDECTOMY    . BACK SURGERY     FUSION  . THORACOTOMY Right 2008  ?  . TONSILLECTOMY      Current Medications: Current Meds  Medication Sig  . acetaminophen (TYLENOL) 500 MG tablet Take 1,000 mg by mouth 3 (three) times daily.   . busPIRone (BUSPAR) 10 MG tablet Take 10 mg by mouth 3 (three) times daily.   . cetirizine (ZYRTEC) 10 MG tablet Take 10 mg by mouth daily.  . clonazePAM (KLONOPIN) 0.5 MG tablet Take 0.5 mg by mouth 3 (three) times daily.  . colchicine 0.6 MG tablet Take 0.6 mg by mouth daily as needed (as directed for gout flares).  . Cyanocobalamin (VITAMIN B-12) 5000 MCG TBDP Take 5,000 mcg by  mouth daily.   Eduard Roux (AIMOVIG) 70 MG/ML SOAJ Inject 70 mg into the skin every 30 (thirty) days.  Marland Kitchen ezetimibe (ZETIA) 10 MG tablet Take 10 mg by mouth daily.  Marland Kitchen gabapentin (NEURONTIN) 300 MG capsule Take 2 capsules (600 mg total) by mouth 3 (three) times daily.  Marland Kitchen loperamide (IMODIUM A-D) 2 MG tablet Take 1 tablet (2 mg total) by mouth 4 (four) times daily as needed for diarrhea or loose stools.  Marland Kitchen OZEMPIC, 0.25 OR 0.5 MG/DOSE, 2 MG/1.5ML SOPN Inject 0.5 mg into the skin once a week. Taking on Friday  . pantoprazole (PROTONIX) 40 MG tablet Take 40 mg by mouth daily before breakfast.   . rizatriptan (MAXALT) 10 MG tablet Take 10 mg by mouth as needed for migraine.   . tamsulosin (FLOMAX) 0.4 MG CAPS capsule Take 0.4 mg by mouth at bedtime.   Marland Kitchen tiZANidine (ZANAFLEX) 4 MG tablet Take 4 mg by mouth at bedtime.   . topiramate (TOPAMAX) 50 MG tablet Take 50 mg by mouth 2 (two) times daily.   . TRIUMEQ 600-50-300 MG tablet Take 1 tablet by mouth daily.   . valACYclovir (VALTREX) 1000 MG tablet Take 1,000 mg by mouth 3 (three) times daily as needed (as directed for trigeminal neuralgia flares). Trig neuralgia flare  . Vitamin D, Ergocalciferol, (DRISDOL) 1.25 MG (50000 UT) CAPS capsule Take 50,000 Units by  mouth every Thursday.      Allergies:   Adhesive [tape], Cyclobenzaprine, Cymbalta [duloxetine hcl], and Morphine   Social History   Socioeconomic History  . Marital status: Divorced    Spouse name: Not on file  . Number of children: 2  . Years of education: Not on file  . Highest education level: Bachelor's degree (e.g., BA, AB, BS)  Occupational History  . Not on file  Tobacco Use  . Smoking status: Never Smoker  . Smokeless tobacco: Never Used  Vaping Use  . Vaping Use: Never used  Substance and Sexual Activity  . Alcohol use: Not Currently  . Drug use: Never  . Sexual activity: Not on file  Other Topics Concern  . Not on file  Social History Narrative   Pt is divorced    2 children   Right handed   Drinks soda, no coffee, no tea   Social Determinants of Health   Financial Resource Strain:   . Difficulty of Paying Living Expenses:   Food Insecurity:   . Worried About Charity fundraiser in the Last Year:   . Arboriculturist in the Last Year:   Transportation Needs:   . Film/video editor (Medical):   Marland Kitchen Lack of Transportation (Non-Medical):   Physical Activity:   . Days of Exercise per Week:   . Minutes of Exercise per Session:   Stress:   . Feeling of Stress :   Social Connections:   . Frequency of Communication with Friends and Family:   . Frequency of Social Gatherings with Friends and Family:   . Attends Religious Services:   . Active Member of Clubs or Organizations:   . Attends Archivist Meetings:   Marland Kitchen Marital Status:      Family History: The patient's family history includes CAD in his brother, father, and mother; Lung cancer in his father and mother.  ROS:   Please see the history of present illness.    All other systems reviewed and are negative.  EKGs/Labs/Other Studies Reviewed:    The following studies were reviewed today:  EKG:  NSR  Recent Labs: 02/12/2020: ALT 13; BUN 18; Creatinine, Ser 1.35; Hemoglobin 13.3; Platelets 148; Potassium 3.9; Sodium 136  Recent Lipid Panel    Component Value Date/Time   CHOL 149 11/22/2019 2113   TRIG 133 02/12/2020 1928   HDL 25 (L) 11/22/2019 2113   CHOLHDL 6.0 11/22/2019 2113   VLDL 77 (H) 11/22/2019 2113   LDLCALC 47 11/22/2019 2113    Physical Exam:    VS:  BP 130/84   Pulse 96   Ht 6' (1.829 m)   Wt 160 lb 12.8 oz (72.9 kg)   BMI 21.81 kg/m     Wt Readings from Last 5 Encounters:  05/30/20 160 lb 12.8 oz (72.9 kg)  02/12/20 180 lb (81.6 kg)  01/21/20 177 lb 6.4 oz (80.5 kg)  12/05/19 188 lb (85.3 kg)  11/29/19 185 lb 6.4 oz (84.1 kg)     Constitutional: No acute distress Cardiovascular: regular rhythm, normal rate, no murmurs. No jugular venous  distention.  Respiratory: clear to auscultation bilaterally  MSK: extremities warm, well perfused. No edema.  NEURO: grossly nonfocal exam, moves all extremities. PSYCH: alert and oriented x 3, normal mood and affect.   ASSESSMENT:    1. Coronary artery disease involving native coronary artery of native heart without angina pectoris   2. History of COVID-19    PLAN:  Tachycardia and hypotension - resolved, though he is quite concerned about unresolved infection. This is likely the source. Discussed ED presentation to further evaluate or contact PCP.   CAD- stable on nitrate. Recommend resuming propranolol as VS are stable.   covid 19 - may be contributing to DOE as this has not been a symptom of his CAD previously. Will observe for now since primary concern is infection    Cherlynn Kaiser, MD [place]  [facility]    Medication Adjustments/Labs and Tests Ordered: Current medicines are reviewed at length with the patient today.  Concerns regarding medicines are outlined above.  No orders of the defined types were placed in this encounter.  No orders of the defined types were placed in this encounter.   There are no Patient Instructions on file for this visit.

## 2020-06-02 ENCOUNTER — Other Ambulatory Visit: Payer: Self-pay

## 2020-06-02 ENCOUNTER — Encounter (HOSPITAL_BASED_OUTPATIENT_CLINIC_OR_DEPARTMENT_OTHER): Payer: Self-pay

## 2020-06-02 ENCOUNTER — Emergency Department (HOSPITAL_BASED_OUTPATIENT_CLINIC_OR_DEPARTMENT_OTHER)
Admission: EM | Admit: 2020-06-02 | Discharge: 2020-06-03 | Disposition: A | Discharge status: Home / Self Care | Payer: Medicare Other | Attending: Emergency Medicine | Admitting: Emergency Medicine

## 2020-06-02 DIAGNOSIS — Y929 Unspecified place or not applicable: Secondary | ICD-10-CM | POA: Diagnosis not present

## 2020-06-02 DIAGNOSIS — E119 Type 2 diabetes mellitus without complications: Secondary | ICD-10-CM | POA: Insufficient documentation

## 2020-06-02 DIAGNOSIS — R0602 Shortness of breath: Secondary | ICD-10-CM | POA: Diagnosis not present

## 2020-06-02 DIAGNOSIS — Z888 Allergy status to other drugs, medicaments and biological substances status: Secondary | ICD-10-CM | POA: Diagnosis not present

## 2020-06-02 DIAGNOSIS — Z886 Allergy status to analgesic agent status: Secondary | ICD-10-CM | POA: Diagnosis not present

## 2020-06-02 DIAGNOSIS — Y792 Prosthetic and other implants, materials and accessory orthopedic devices associated with adverse incidents: Secondary | ICD-10-CM | POA: Diagnosis not present

## 2020-06-02 DIAGNOSIS — E860 Dehydration: Secondary | ICD-10-CM | POA: Diagnosis not present

## 2020-06-02 DIAGNOSIS — Z9889 Other specified postprocedural states: Secondary | ICD-10-CM | POA: Diagnosis not present

## 2020-06-02 DIAGNOSIS — R197 Diarrhea, unspecified: Secondary | ICD-10-CM | POA: Insufficient documentation

## 2020-06-02 DIAGNOSIS — Z79899 Other long term (current) drug therapy: Secondary | ICD-10-CM | POA: Diagnosis not present

## 2020-06-02 DIAGNOSIS — T8361XA Infection and inflammatory reaction due to implanted penile prosthesis, initial encounter: Secondary | ICD-10-CM | POA: Diagnosis present

## 2020-06-02 DIAGNOSIS — S31103A Unspecified open wound of abdominal wall, right lower quadrant without penetration into peritoneal cavity, initial encounter: Secondary | ICD-10-CM | POA: Insufficient documentation

## 2020-06-02 DIAGNOSIS — R531 Weakness: Secondary | ICD-10-CM | POA: Diagnosis not present

## 2020-06-02 DIAGNOSIS — Y9301 Activity, walking, marching and hiking: Secondary | ICD-10-CM | POA: Insufficient documentation

## 2020-06-02 DIAGNOSIS — B20 Human immunodeficiency virus [HIV] disease: Secondary | ICD-10-CM | POA: Diagnosis not present

## 2020-06-02 DIAGNOSIS — Y999 Unspecified external cause status: Secondary | ICD-10-CM | POA: Insufficient documentation

## 2020-06-02 MED ORDER — SODIUM CHLORIDE 0.9 % IV BOLUS
1000.0000 mL | Freq: Once | INTRAVENOUS | Status: AC
  Administered 2020-06-02: 1000 mL via INTRAVENOUS

## 2020-06-02 MED ORDER — ONDANSETRON HCL 4 MG/2ML IJ SOLN
4.0000 mg | Freq: Once | INTRAMUSCULAR | Status: AC
  Administered 2020-06-02: 4 mg via INTRAVENOUS
  Filled 2020-06-02: qty 2

## 2020-06-02 NOTE — ED Notes (Signed)
This RN accompanied Dr. Florina Ou for penile/scrotal exam at present time.

## 2020-06-02 NOTE — ED Provider Notes (Addendum)
[facility] Provider Note: Brian Spurling, MD, FACEP  CSN: [id number] MRN: [id number] ARRIVAL: 06/02/20 at 2239 ROOM: [place]  Post-op Problem   HISTORY OF PRESENT ILLNESS  06/02/20 11:54 PM Brian Malone is a 69 y.o. male who had COVID-19 in February of this year. He states he has never fully regained his strength following that infection and still has a chronic cough.   He had a penile prosthetic implanted 03/31/2020 by Dr. Amalia Hailey at [facility]. Since that surgery he has had chronic right groin pain at the site of the reservoir.  He was seen in the ED at [facility] on 05/12/2020 for multiple skin abscesses which were found to contain MRSA, and he was subsequently found to have a MRSA infection of his implant. The implant was removed surgically on 05/13/2020 a drain was placed in his perineum up into his inguinal region which has subsequently been been removed. He had been on Bactrim until 2 days ago. He states he has never felt back to normal since his second surgery but has felt worse since stopping the antibiotic. Today while walking about a mile from a bus stop he became very weak and had a near syncopal episode.  He has also had diarrhea.  He states his heart has been beating rapidly and he has been short of breath with exertion. He continues to have pain at the reservoir site which he rates as a 5 out of 10, tolerable when lying in bed but worse when ambulating. He has a wound at the edge of his right upper scrotum which has not fully healed. He denies a fever.  The patient has HIV and is on HAART.  His last CD4 count was 0.9 on 05/13/2020.  Past Medical History:  Diagnosis Date  . Anxiety   . Diabetes mellitus without complication ([place])   . HIV (human immunodeficiency virus infection) ([place])   . Hypertension   . Renal disorder     Past Surgical History:  Procedure Laterality Date  . ANKLE ARTHROSCOPY    . APPENDECTOMY    . BACK  SURGERY     FUSION  . THORACOTOMY Right 2008  ?  . TONSILLECTOMY      Family History  Problem Relation Age of Onset  . CAD Mother   . Lung cancer Mother   . CAD Father   . Lung cancer Father   . CAD Brother     Social History   Tobacco Use  . Smoking status: Never Smoker  . Smokeless tobacco: Never Used  Vaping Use  . Vaping Use: Never used  Substance Use Topics  . Alcohol use: Not Currently  . Drug use: Never    Prior to Admission medications   Medication Sig Start Date End Date Taking? Authorizing Provider  acetaminophen (TYLENOL) 500 MG tablet Take 1,000 mg by mouth 3 (three) times daily.     [provider]  busPIRone (BUSPAR) 10 MG tablet Take 10 mg by mouth 3 (three) times daily.  01/22/19   [provider]  cetirizine (ZYRTEC) 10 MG tablet Take 10 mg by mouth daily.    [provider]  clonazePAM (KLONOPIN) 0.5 MG tablet Take 0.5 mg by mouth 3 (three) times daily. 10/01/19   [provider]  colchicine 0.6 MG tablet Take 0.6 mg by mouth daily as needed (as directed for gout flares).    [provider]  Cyanocobalamin (VITAMIN B-12) 5000 MCG TBDP Take  5,000 mcg by mouth daily.     [provider]  Erenumab-aooe (AIMOVIG) 70 MG/ML SOAJ Inject 70 mg into the skin every 30 (thirty) days. 11/22/19   Pieter Partridge, DO  ezetimibe (ZETIA) 10 MG tablet Take 10 mg by mouth daily.    [provider]  gabapentin (NEURONTIN) 300 MG capsule Take 2 capsules (600 mg total) by mouth 3 (three) times daily. 11/24/19   Geradine Girt, DO  isosorbide mononitrate (IMDUR) 60 MG 24 hr tablet Take 1 tablet (60 mg total) by mouth daily. 11/29/19 02/27/20  Elouise Munroe, MD  loperamide (IMODIUM A-D) 2 MG tablet Take 1 tablet (2 mg total) by mouth 4 (four) times daily as needed for diarrhea or loose stools. 02/12/20   Fredia Sorrow, MD  OZEMPIC, 0.25 OR 0.5 MG/DOSE, 2 MG/1.5ML SOPN Inject 0.5 mg into the skin once a week. Taking on  Friday 11/14/19   [provider]  pantoprazole (PROTONIX) 40 MG tablet Take 40 mg by mouth daily before breakfast.  02/12/19   [provider]  rizatriptan (MAXALT) 10 MG tablet Take 10 mg by mouth as needed for migraine.  03/21/19   [provider]  tamsulosin (FLOMAX) 0.4 MG CAPS capsule Take 0.4 mg by mouth at bedtime.  02/12/19   [provider]  tiZANidine (ZANAFLEX) 4 MG tablet Take 4 mg by mouth at bedtime.  03/08/19   [provider]  topiramate (TOPAMAX) 50 MG tablet Take 50 mg by mouth 2 (two) times daily.  03/21/19   [provider]  TRIUMEQ 600-50-300 MG tablet Take 1 tablet by mouth daily.  02/12/19   [provider]  valACYclovir (VALTREX) 1000 MG tablet Take 1,000 mg by mouth 3 (three) times daily as needed (as directed for trigeminal neuralgia flares). Trig neuralgia flare 10/16/19   [provider]  Vitamin D, Ergocalciferol, (DRISDOL) 1.25 MG (50000 UT) CAPS capsule Take 50,000 Units by mouth every Thursday.  03/21/19   [provider]    Allergies Adhesive [tape], Cyclobenzaprine, Cymbalta [duloxetine hcl], and Morphine   REVIEW OF SYSTEMS  Negative except as noted here or in the History of Present Illness.   PHYSICAL EXAMINATION  Initial Vital Signs Blood pressure (!) 114/91, pulse (!) 116, temperature 98.7 F (37.1 C), temperature source Oral, resp. rate 16, height 6' (1.829 m), weight 72.6 kg, SpO2 100 %.  Examination General: Well-developed, well-nourished male in no acute distress; appearance consistent with age of record HENT: normocephalic; atraumatic Eyes: pupils equal, round and reactive to light; extraocular muscles intact Neck: supple Heart: regular rate and rhythm Lungs: clear to auscultation bilaterally Abdomen: soft; nondistended; nontender; bowel sounds present GU: Tanner V male, circumcised; no urethral discharge; edema and tenderness of right scrotum and inguinal region with  incompletely healed surgical wound of the right proximal scrotal edge without erythema or purulent discharge:    Extremities: No deformity; full range of motion; pulses normal Neurologic: Awake, alert and oriented; motor function intact in all extremities and symmetric; no facial droop Skin: Warm and dry Psychiatric: Normal mood and affect   RESULTS  Summary of this visit's results, reviewed and interpreted by myself:   EKG Interpretation  Date/Time:    Ventricular Rate:    PR Interval:    QRS Duration:   QT Interval:    QTC Calculation:   R Axis:     Text Interpretation:        Laboratory Studies: Results for orders placed or performed during  the hospital encounter of 06/02/20 (from the past 24 hour(s))  CBC with Differential/Platelet     Status: Abnormal   Collection Time: 06/02/20 10:57 PM  Result Value Ref Range   WBC 2.7 (L) 4.0 - 10.5 K/uL   RBC 3.46 (L) 4.22 - 5.81 MIL/uL   Hemoglobin 11.6 (L) 13.0 - 17.0 g/dL   HCT 35.1 (L) 39 - 52 %   MCV 101.4 (H) 80.0 - 100.0 fL   MCH 33.5 26.0 - 34.0 pg   MCHC 33.0 30.0 - 36.0 g/dL   RDW 15.4 11.5 - 15.5 %   Platelets 148 (L) 150 - 400 K/uL   nRBC 0.0 0.0 - 0.2 %   Neutrophils Relative % 48 %   Neutro Abs 1.3 (L) 1.7 - 7.7 K/uL   Lymphocytes Relative 30 %   Lymphs Abs 0.8 0.7 - 4.0 K/uL   Monocytes Relative 17 %   Monocytes Absolute 0.5 0 - 1 K/uL   Eosinophils Relative 3 %   Eosinophils Absolute 0.1 0 - 0 K/uL   Basophils Relative 1 %   Basophils Absolute 0.0 0 - 0 K/uL   Immature Granulocytes 1 %   Abs Immature Granulocytes 0.02 0.00 - 0.07 K/uL  Basic metabolic panel     Status: Abnormal   Collection Time: 06/02/20 10:57 PM  Result Value Ref Range   Sodium 144 135 - 145 mmol/L   Potassium 3.4 (L) 3.5 - 5.1 mmol/L   Chloride 109 98 - 111 mmol/L   CO2 25 22 - 32 mmol/L   Glucose, Bld 114 (H) 70 - 99 mg/dL   BUN 23 8 - 23 mg/dL   Creatinine, Ser 1.30 (H) 0.61 - 1.24 mg/dL   Calcium 8.6 (L) 8.9 - 10.3 mg/dL    GFR calc non Af Amer 57 (L) >60 mL/min   GFR calc Af Amer >60 >60 mL/min   Anion gap 10 5 - 15  Urinalysis, Routine w reflex microscopic     Status: Abnormal   Collection Time: 06/03/20 12:41 AM  Result Value Ref Range   Color, Urine YELLOW YELLOW   APPearance CLEAR CLEAR   Specific Gravity, Urine >1.030 (H) 1.005 - 1.030   pH 6.0 5.0 - 8.0   Glucose, UA NEGATIVE NEGATIVE mg/dL   Hgb urine dipstick NEGATIVE NEGATIVE   Bilirubin Urine NEGATIVE NEGATIVE   Ketones, ur NEGATIVE NEGATIVE mg/dL   Protein, ur 30 (A) NEGATIVE mg/dL   Nitrite NEGATIVE NEGATIVE   Leukocytes,Ua NEGATIVE NEGATIVE  Urinalysis, Microscopic (reflex)     Status: Abnormal   Collection Time: 06/03/20 12:41 AM  Result Value Ref Range   RBC / HPF 6-10 0 - 5 RBC/hpf   WBC, UA 0-5 0 - 5 WBC/hpf   Bacteria, UA FEW (A) NONE SEEN   Squamous Epithelial / LPF 0-5 0 - 5   Imaging Studies: CT PELVIS W CONTRAST  Result Date: 06/03/2020 CLINICAL DATA:  Removal of penile implant 2 weeks ago with infection. Groin pain. EXAM: CT PELVIS WITH CONTRAST TECHNIQUE: Multidetector CT imaging of the pelvis was performed using the standard protocol following the bolus administration of intravenous contrast. CONTRAST:  183mL OMNIPAQUE IOHEXOL 300 MG/ML  SOLN COMPARISON:  None. FINDINGS: Urinary Tract:  No abnormality visualized. Bowel:  Unremarkable visualized pelvic bowel loops. Vascular/Lymphatic: Aortic and iliac calcifications. No aneurysm or adenopathy. Reproductive: Small locules of gas and stranding noted in the right inguinal region/groin. No measurable focal fluid collection. Other:  No free fluid or free air.  Musculoskeletal: No acute bony abnormality. IMPRESSION: Soft tissue stranding with a few locules of gas in the right inguinal canal and right groin. This could be related to postoperative changes. No measurable focal fluid collection. Electronically Signed   By: Rolm Baptise M.D.   On: 06/03/2020 02:13    ED COURSE and MDM    Nursing notes, initial and subsequent vitals signs, including pulse oximetry, reviewed and interpreted by myself.  Vitals:   06/02/20 2244 06/03/20 0121  BP: (!) 114/91 129/82  Pulse: (!) 116 88  Resp: 16 14  Temp: 98.7 F (37.1 C)   TempSrc: Oral   SpO2: 100% 100%  Weight: 72.6 kg   Height: 6' (1.829 m)    Medications  sodium chloride 0.9 % bolus 1,000 mL (0 mLs Intravenous Stopped 06/03/20 0035)  ondansetron (ZOFRAN) injection 4 mg (4 mg Intravenous Given 06/02/20 2315)  iohexol (OMNIPAQUE) 300 MG/ML solution 100 mL (100 mLs Intravenous Contrast Given 06/03/20 0144)  sodium chloride 0.9 % bolus 1,000 mL (1,000 mLs Intravenous New Bag/Given 06/03/20 0233)   12:11 AM IV fluid bolus initiated. Patient declines pain medication at this time. He states his pain is not significant when lying supine and he took Tylenol prior to arrival.  3:19 AM Patient advised of reassuring CT scan.  His wound, although's healing slowly, does not appear clinically infected and there is no obvious infection on CT scan.  His weakness may be due to in part to dehydration and he was given 2 L of normal saline in the emergency department.  His tachycardia has improved.  He was advised that other causes of his continued fatigue are best addressed with his primary care physician.  Patient unable to provide a stool sample for C. difficile testing.    PROCEDURES  Procedures   ED DIAGNOSES     ICD-10-CM   1. Non-healing open wound of right groin, initial encounter  S31.103A   2. Dehydration  E86.0   3. Generalized weakness  R53.1        Irais Mottram, MD 06/03/20 0321    Shanon Rosser, MD 06/03/20 [phone]

## 2020-06-02 NOTE — ED Notes (Signed)
Pt is aware he needs urine sample

## 2020-06-02 NOTE — ED Notes (Signed)
ED Provider at bedside. 

## 2020-06-02 NOTE — ED Triage Notes (Signed)
Pt had a penile prosthetic placed in May. 2 weeks ago he had to have it removed due to infection and was placed on abx. Pt reports pain to groin, weakness, diarrhea, and near syncope. Pt states "I just don't feel good."

## 2020-06-03 ENCOUNTER — Emergency Department (HOSPITAL_BASED_OUTPATIENT_CLINIC_OR_DEPARTMENT_OTHER): Payer: Medicare Other

## 2020-06-03 DIAGNOSIS — S31103A Unspecified open wound of abdominal wall, right lower quadrant without penetration into peritoneal cavity, initial encounter: Secondary | ICD-10-CM | POA: Diagnosis not present

## 2020-06-03 LAB — CBC WITH DIFFERENTIAL/PLATELET
Abs Immature Granulocytes: 0.02 10*3/uL (ref 0.00–0.07)
Basophils Absolute: 0 10*3/uL (ref 0.0–0.1)
Basophils Relative: 1 %
Eosinophils Absolute: 0.1 10*3/uL (ref 0.0–0.5)
Eosinophils Relative: 3 %
HCT: 35.1 % — ABNORMAL LOW (ref 39.0–52.0)
Hemoglobin: 11.6 g/dL — ABNORMAL LOW (ref 13.0–17.0)
Immature Granulocytes: 1 %
Lymphocytes Relative: 30 %
Lymphs Abs: 0.8 10*3/uL (ref 0.7–4.0)
MCH: 33.5 pg (ref 26.0–34.0)
MCHC: 33 g/dL (ref 30.0–36.0)
MCV: 101.4 fL — ABNORMAL HIGH (ref 80.0–100.0)
Monocytes Absolute: 0.5 10*3/uL (ref 0.1–1.0)
Monocytes Relative: 17 %
Neutro Abs: 1.3 10*3/uL — ABNORMAL LOW (ref 1.7–7.7)
Neutrophils Relative %: 48 %
Platelets: 148 10*3/uL — ABNORMAL LOW (ref 150–400)
RBC: 3.46 MIL/uL — ABNORMAL LOW (ref 4.22–5.81)
RDW: 15.4 % (ref 11.5–15.5)
WBC: 2.7 10*3/uL — ABNORMAL LOW (ref 4.0–10.5)
nRBC: 0 % (ref 0.0–0.2)

## 2020-06-03 LAB — URINALYSIS, MICROSCOPIC (REFLEX)

## 2020-06-03 LAB — BASIC METABOLIC PANEL
Anion gap: 10 (ref 5–15)
BUN: 23 mg/dL (ref 8–23)
CO2: 25 mmol/L (ref 22–32)
Calcium: 8.6 mg/dL — ABNORMAL LOW (ref 8.9–10.3)
Chloride: 109 mmol/L (ref 98–111)
Creatinine, Ser: 1.3 mg/dL — ABNORMAL HIGH (ref 0.61–1.24)
GFR calc Af Amer: >60 mL/min (ref >60)
GFR calc non Af Amer: 57 mL/min — ABNORMAL LOW (ref >60)
Glucose, Bld: 114 mg/dL — ABNORMAL HIGH (ref 70–99)
Potassium: 3.4 mmol/L — ABNORMAL LOW (ref 3.5–5.1)
Sodium: 144 mmol/L (ref 135–145)

## 2020-06-03 LAB — URINALYSIS, ROUTINE W REFLEX MICROSCOPIC
Bilirubin Urine: NEGATIVE
Glucose, UA: NEGATIVE mg/dL
Hgb urine dipstick: NEGATIVE
Ketones, ur: NEGATIVE mg/dL
Leukocytes,Ua: NEGATIVE
Nitrite: NEGATIVE
Protein, ur: 30 mg/dL — AB
Specific Gravity, Urine: >1.03 — ABNORMAL HIGH (ref 1.005–1.030)
pH: 6 (ref 5.0–8.0)

## 2020-06-03 MED ORDER — SODIUM CHLORIDE 0.9 % IV BOLUS
1000.0000 mL | Freq: Once | INTRAVENOUS | Status: AC
  Administered 2020-06-03: 1000 mL via INTRAVENOUS

## 2020-06-03 MED ORDER — IOHEXOL 300 MG/ML  SOLN
100.0000 mL | Freq: Once | INTRAMUSCULAR | Status: AC | PRN
  Administered 2020-06-03: 100 mL via INTRAVENOUS

## 2020-06-18 ENCOUNTER — Emergency Department (HOSPITAL_BASED_OUTPATIENT_CLINIC_OR_DEPARTMENT_OTHER)
Admission: EM | Admit: 2020-06-18 | Discharge: 2020-06-18 | Disposition: A | Discharge status: Home / Self Care | Payer: Medicare Other | Attending: Emergency Medicine | Admitting: Emergency Medicine

## 2020-06-18 ENCOUNTER — Encounter (HOSPITAL_BASED_OUTPATIENT_CLINIC_OR_DEPARTMENT_OTHER): Payer: Self-pay | Admitting: *Deleted

## 2020-06-18 ENCOUNTER — Other Ambulatory Visit: Payer: Self-pay

## 2020-06-18 DIAGNOSIS — Z79899 Other long term (current) drug therapy: Secondary | ICD-10-CM | POA: Insufficient documentation

## 2020-06-18 DIAGNOSIS — E1140 Type 2 diabetes mellitus with diabetic neuropathy, unspecified: Secondary | ICD-10-CM | POA: Insufficient documentation

## 2020-06-18 DIAGNOSIS — Z21 Asymptomatic human immunodeficiency virus [HIV] infection status: Secondary | ICD-10-CM | POA: Diagnosis not present

## 2020-06-18 DIAGNOSIS — I2510 Atherosclerotic heart disease of native coronary artery without angina pectoris: Secondary | ICD-10-CM | POA: Insufficient documentation

## 2020-06-18 DIAGNOSIS — L03115 Cellulitis of right lower limb: Secondary | ICD-10-CM | POA: Diagnosis present

## 2020-06-18 DIAGNOSIS — I10 Essential (primary) hypertension: Secondary | ICD-10-CM | POA: Diagnosis not present

## 2020-06-18 DIAGNOSIS — N4822 Cellulitis of corpus cavernosum and penis: Secondary | ICD-10-CM

## 2020-06-18 DIAGNOSIS — M7989 Other specified soft tissue disorders: Secondary | ICD-10-CM | POA: Insufficient documentation

## 2020-06-18 DIAGNOSIS — E1165 Type 2 diabetes mellitus with hyperglycemia: Secondary | ICD-10-CM | POA: Insufficient documentation

## 2020-06-18 LAB — COMPREHENSIVE METABOLIC PANEL
ALT: 43 U/L (ref 0–44)
AST: 40 U/L (ref 15–41)
Albumin: 4.1 g/dL (ref 3.5–5.0)
Alkaline Phosphatase: 78 U/L (ref 38–126)
Anion gap: 9 (ref 5–15)
BUN: 16 mg/dL (ref 8–23)
CO2: 21 mmol/L — ABNORMAL LOW (ref 22–32)
Calcium: 8.8 mg/dL — ABNORMAL LOW (ref 8.9–10.3)
Chloride: 109 mmol/L (ref 98–111)
Creatinine, Ser: 1.45 mg/dL — ABNORMAL HIGH (ref 0.61–1.24)
GFR calc Af Amer: 58 mL/min — ABNORMAL LOW (ref >60)
GFR calc non Af Amer: 50 mL/min — ABNORMAL LOW (ref >60)
Glucose, Bld: 102 mg/dL — ABNORMAL HIGH (ref 70–99)
Potassium: 4.2 mmol/L (ref 3.5–5.1)
Sodium: 139 mmol/L (ref 135–145)
Total Bilirubin: 0.5 mg/dL (ref 0.3–1.2)
Total Protein: 7.9 g/dL (ref 6.5–8.1)

## 2020-06-18 LAB — CBC WITH DIFFERENTIAL/PLATELET
Abs Immature Granulocytes: 0 10*3/uL (ref 0.00–0.07)
Basophils Absolute: 0 10*3/uL (ref 0.0–0.1)
Basophils Relative: 0 %
Eosinophils Absolute: 0.1 10*3/uL (ref 0.0–0.5)
Eosinophils Relative: 2 %
HCT: 37.7 % — ABNORMAL LOW (ref 39.0–52.0)
Hemoglobin: 12.1 g/dL — ABNORMAL LOW (ref 13.0–17.0)
Immature Granulocytes: 0 %
Lymphocytes Relative: 34 %
Lymphs Abs: 0.9 10*3/uL (ref 0.7–4.0)
MCH: 32.8 pg (ref 26.0–34.0)
MCHC: 32.1 g/dL (ref 30.0–36.0)
MCV: 102.2 fL — ABNORMAL HIGH (ref 80.0–100.0)
Monocytes Absolute: 0.3 10*3/uL (ref 0.1–1.0)
Monocytes Relative: 12 %
Neutro Abs: 1.4 10*3/uL — ABNORMAL LOW (ref 1.7–7.7)
Neutrophils Relative %: 52 %
Platelets: 166 10*3/uL (ref 150–400)
RBC: 3.69 MIL/uL — ABNORMAL LOW (ref 4.22–5.81)
RDW: 14.3 % (ref 11.5–15.5)
WBC: 2.7 10*3/uL — ABNORMAL LOW (ref 4.0–10.5)
nRBC: 0 % (ref 0.0–0.2)

## 2020-06-18 MED ORDER — CLINDAMYCIN HCL 300 MG PO CAPS: 300.0000 mg | ORAL_CAPSULE | Freq: Three times a day (TID) | ORAL | 0 refills | Status: DC

## 2020-06-18 MED ORDER — FENTANYL CITRATE (PF) 100 MCG/2ML IJ SOLN
50.0000 ug | Freq: Once | INTRAMUSCULAR | Status: AC
  Administered 2020-06-18: 50 ug via INTRAVENOUS
  Filled 2020-06-18: qty 2

## 2020-06-18 MED ORDER — VANCOMYCIN HCL IN DEXTROSE 1-5 GM/200ML-% IV SOLN
1000.0000 mg | Freq: Once | INTRAVENOUS | Status: AC
  Administered 2020-06-18: 1000 mg via INTRAVENOUS
  Filled 2020-06-18: qty 200

## 2020-06-18 MED ORDER — SODIUM CHLORIDE 0.9 % IV SOLN
INTRAVENOUS | Status: DC | PRN
  Administered 2020-06-18: 250 mL via INTRAVENOUS

## 2020-06-18 MED ORDER — HYDROCODONE-ACETAMINOPHEN 5-325 MG PO TABS: 1.0000 | ORAL_TABLET | Freq: Four times a day (QID) | ORAL | 0 refills | Status: DC | PRN

## 2020-06-18 NOTE — Discharge Instructions (Addendum)
Stop bactrim. Take clindamycin instead   Take tylenol for pain   Take norco for severe pain   See your urologist for follow up   See your primary care doctor   Return to ER if you have worse leg pain, increased redness in the knee, knee swelling, fever

## 2020-06-18 NOTE — ED Notes (Signed)
ED Provider at bedside with US 

## 2020-06-18 NOTE — ED Provider Notes (Signed)
[name] Provider Note   CSN: [id number] Arrival date & time: 06/18/20  1856     History Chief Complaint  Patient presents with  . Abscess    Brian Malone is a 69 y.o. male hx of DM, HIV (CD 4 normal a month ago), here presenting with right knee cellulitis.  Patient states that he had a penile implant that was infected previously.  Patient states that he has been having recurrent MRSA infections.  He states that he has a wound in his penis area and into the ED 2 weeks ago and was given Bactrim.  He states that his penile wound is still draining but for the last 3 to 4 days, he noticed a bump in the right knee area.  He states is progressively more painful and has some drainage as well.  He states that he is able to move his leg and denies any fevers.  The history is provided by the patient.       Past Medical History:  Diagnosis Date  . Anxiety   . Diabetes mellitus without complication ([place])   . HIV (human immunodeficiency virus infection) ([place])   . Hypertension   . Renal disorder     Patient Active Problem List   Diagnosis Date Noted  . Hypotension 05/26/2020  . CRI (chronic renal insufficiency), stage 3 (moderate) 05/26/2020  . CAD (coronary artery disease) 05/26/2020  . History of COVID-19 05/26/2020  . Dizziness 12/26/2019  . Chest pain 11/22/2019  . Acute subdural hematoma ([place]) 10/28/2019  . Subdural hematoma ([place]) 10/26/2019  . Dysphagia 06/20/2019  . AKI (acute kidney injury) ([place]) 06/20/2019  . HCAP (healthcare-associated pneumonia) 06/20/2019  . Normal anion gap metabolic acidosis 40/98/1191  . ARF (acute renal failure) ([place]) 06/19/2019  . Cervical spinal stenosis 05/18/2019  . Lumbar spondylosis 05/18/2019  . Sacroiliitis ([place]) 05/15/2019  . Right foot ulcer ([place]) 05/13/2019  . HIV (human immunodeficiency virus infection) ([place]) 05/13/2019  . Tachycardia 04/16/2019  . Diabetes ([place]) 04/16/2019  . Cellulitis  04/16/2019  . Wound infection after surgery 04/15/2019  . Benign prostatic hyperplasia (BPH) with straining on urination 02/22/2019  . Essential hypertension 02/22/2019  . Post-traumatic osteoarthritis of both ankles 02/22/2019  . Trigeminal neuralgia 02/22/2019  . Uncontrolled type 2 diabetes mellitus with hyperglycemia ([place]) 02/22/2019  . Chronic bilateral low back pain with bilateral sciatica 01/19/2019  . Diabetic autonomic neuropathy associated with type 2 diabetes mellitus ([place]) 01/19/2019  . Migraine with aura and without status migrainosus, not intractable 01/19/2019  . Neck pain 01/19/2019  . Neuropathy of both feet 01/19/2019  . Nocturia more than twice per night 01/07/2015  . Anxiety 10/23/2014  . Candida, oral 10/23/2014  . Depression 10/23/2014  . Insomnia 10/23/2014  . Rash of entire body 10/23/2014    Past Surgical History:  Procedure Laterality Date  . ANKLE ARTHROSCOPY    . APPENDECTOMY    . BACK SURGERY     FUSION  . THORACOTOMY Right 2008  ?  . TONSILLECTOMY         Family History  Problem Relation Age of Onset  . CAD Mother   . Lung cancer Mother   . CAD Father   . Lung cancer Father   . CAD Brother     Social History   Tobacco Use  . Smoking status: Never Smoker  . Smokeless tobacco: Never Used  Vaping Use  . Vaping Use: Never used  Substance Use Topics  . Alcohol  use: Not Currently  . Drug use: Never    Home Medications Prior to Admission medications   Medication Sig Start Date End Date Taking? Authorizing Provider  acetaminophen (TYLENOL) 500 MG tablet Take 1,000 mg by mouth 3 (three) times daily.     [provider]  busPIRone (BUSPAR) 10 MG tablet Take 10 mg by mouth 3 (three) times daily.  01/22/19   [provider]  cetirizine (ZYRTEC) 10 MG tablet Take 10 mg by mouth daily.    [provider]  clonazePAM (KLONOPIN) 0.5 MG tablet Take 0.5 mg by mouth 3 (three) times daily. 10/01/19   [provider]  colchicine 0.6 MG tablet Take 0.6 mg by mouth daily as needed (as directed for gout flares).    [provider]  Cyanocobalamin (VITAMIN B-12) 5000 MCG TBDP Take 5,000 mcg by mouth daily.     [provider]  Erenumab-aooe (AIMOVIG) 70 MG/ML SOAJ Inject 70 mg into the skin every 30 (thirty) days. 11/22/19   Pieter Partridge, DO  ezetimibe (ZETIA) 10 MG tablet Take 10 mg by mouth daily.    [provider]  gabapentin (NEURONTIN) 300 MG capsule Take 2 capsules (600 mg total) by mouth 3 (three) times daily. 11/24/19   Geradine Girt, DO  isosorbide mononitrate (IMDUR) 60 MG 24 hr tablet Take 1 tablet (60 mg total) by mouth daily. 11/29/19 02/27/20  Elouise Munroe, MD  loperamide (IMODIUM A-D) 2 MG tablet Take 1 tablet (2 mg total) by mouth 4 (four) times daily as needed for diarrhea or loose stools. 02/12/20   Fredia Sorrow, MD  OZEMPIC, 0.25 OR 0.5 MG/DOSE, 2 MG/1.5ML SOPN Inject 0.5 mg into the skin once a week. Taking on Friday 11/14/19   [provider]  pantoprazole (PROTONIX) 40 MG tablet Take 40 mg by mouth daily before breakfast.  02/12/19   [provider]  rizatriptan (MAXALT) 10 MG tablet Take 10 mg by mouth as needed for migraine.  03/21/19   [provider]  tamsulosin (FLOMAX) 0.4 MG CAPS capsule Take 0.4 mg by mouth at bedtime.  02/12/19   [provider]  tiZANidine (ZANAFLEX) 4 MG tablet Take 4 mg by mouth at bedtime.  03/08/19   [provider]  topiramate (TOPAMAX) 50 MG tablet Take 50 mg by mouth 2 (two) times daily.  03/21/19   [provider]  TRIUMEQ 600-50-300 MG tablet Take 1 tablet by mouth daily.  02/12/19   [provider]  valACYclovir (VALTREX) 1000 MG tablet Take 1,000 mg by mouth 3 (three) times daily as needed (as directed for trigeminal neuralgia flares). Trig neuralgia flare 10/16/19   [provider]  Vitamin D, Ergocalciferol, (DRISDOL) 1.25 MG (50000 UT) CAPS capsule Take  50,000 Units by mouth every Thursday.  03/21/19   [provider]    Allergies    Adhesive [tape], Cyclobenzaprine, Cymbalta [duloxetine hcl], and Morphine  Review of Systems   Review of Systems  Skin: Positive for rash.  All other systems reviewed and are negative.   Physical Exam Updated Vital Signs BP (!) 145/92 (BP Location: Right Arm)   Pulse 88   Temp 98.9 F (37.2 C) (Oral)   Resp 19   SpO2 100%   Physical Exam Vitals and nursing note reviewed.  HENT:     Head: Normocephalic.     Nose: Nose normal.     Mouth/Throat:     Mouth: Mucous membranes are moist.  Eyes:  Extraocular Movements: Extraocular movements intact.     Pupils: Pupils are equal, round, and reactive to light.  Cardiovascular:     Rate and Rhythm: Normal rate.     Pulses: Normal pulses.     Heart sounds: Normal heart sounds.  Pulmonary:     Effort: Pulmonary effort is normal.     Breath sounds: Normal breath sounds.  Abdominal:     General: Abdomen is flat.     Palpations: Abdomen is soft.  Genitourinary:    Comments: Base of penis with some erythema and mild drainage but no fluctuance. Musculoskeletal:     Cervical back: Normal range of motion.     Comments: On the superior aspect of the right knee, there is an area of cellulitis in the central area with some mild serosanguineous drainage.  There is no fluctuance and patient is able to range the knee is no obvious knee effusion on my exam.  Skin:    General: Skin is warm.     Capillary Refill: Capillary refill takes less than 2 seconds.     Findings: Erythema present.  Neurological:     General: No focal deficit present.     Mental Status: He is alert and oriented to person, place, and time.  Psychiatric:        Mood and Affect: Mood normal.        Behavior: Behavior normal.     ED Results / Procedures / Treatments   Labs (all labs ordered are listed, but only abnormal results are displayed) Labs Reviewed  CBC WITH  DIFFERENTIAL/PLATELET - Abnormal; Notable for the following components:      Result Value   WBC 2.7 (*)    RBC 3.69 (*)    Hemoglobin 12.1 (*)    HCT 37.7 (*)    MCV 102.2 (*)    Neutro Abs 1.4 (*)    All other components within normal limits  COMPREHENSIVE METABOLIC PANEL - Abnormal; Notable for the following components:   CO2 21 (*)    Glucose, Bld 102 (*)    Creatinine, Ser 1.45 (*)    Calcium 8.8 (*)    GFR calc non Af Amer 50 (*)    GFR calc Af Amer 58 (*)    All other components within normal limits    EKG None  Radiology No results found.  Procedures Procedures (including critical care time)  EMERGENCY DEPARTMENT US SOFT TISSUE INTERPRETATION "Study: Limited Soft Tissue Ultrasound"  INDICATIONS: Soft tissue infection Multiple views of the body part were obtained in real-time with a multi-frequency linear probe  PERFORMED BY: Myself IMAGES ARCHIVED?: Yes SIDE:Right  BODY PART:Lower extremity INTERPRETATION:  No abcess noted and Cellulitis present     Medications Ordered in ED Medications  vancomycin (VANCOCIN) IVPB 1000 mg/200 mL premix (1,000 mg Intravenous New Bag/Given 06/18/20 2101)  0.9 %  sodium chloride infusion (250 mLs Intravenous New Bag/Given 06/18/20 2048)  fentaNYL (SUBLIMAZE) injection 50 mcg (50 mcg Intravenous Given 06/18/20 2048)    ED Course  I have reviewed the triage vital signs and the nursing notes.  Pertinent labs & imaging results that were available during my care of the patient were reviewed by me and considered in my medical decision making (see chart for details).    MDM Rules/Calculators/A&P                          NAFTOLI PENNY is a 69 y.o. male history  of HIV that is well controlled, here presenting with right knee cellulitis.  Bedside ultrasound performed and there is no joint effusion and there is no deep abscess.  Patient has a history of MRSA and is still on Bactrim right now.  At this point, I do not think he has  any evidence of septic joint.  He is not febrile and his CBC showed WBC of 2.7.  I do not think patient needs to be admitted for IV antibiotics.  Patient was given a dose of IV vancomycin.  Will discharge home with clindamycin.  Gave strict return precautions.  Final Clinical Impression(s) / ED Diagnoses Final diagnoses:  None    Rx / DC Orders ED Discharge Orders    None       Drenda Freeze, MD 06/18/20 2144

## 2020-06-18 NOTE — ED Triage Notes (Addendum)
Pt c/o abscess to right knee, swelling/ redness  x 5 days

## 2020-06-18 NOTE — ED Notes (Signed)
IV medication completed and IV removed for discharge
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
## 2020-07-22 ENCOUNTER — Ambulatory Visit (INDEPENDENT_AMBULATORY_CARE_PROVIDER_SITE_OTHER): Payer: Medicare Other | Admitting: Neurology

## 2020-07-22 ENCOUNTER — Encounter: Payer: Self-pay | Admitting: Neurology

## 2020-07-22 ENCOUNTER — Other Ambulatory Visit: Payer: Self-pay

## 2020-07-22 VITALS — BP 145/87 | HR 109 | Ht 72.0 in | Wt 164.0 lb

## 2020-07-22 DIAGNOSIS — I25110 Atherosclerotic heart disease of native coronary artery with unstable angina pectoris: Secondary | ICD-10-CM | POA: Diagnosis not present

## 2020-07-22 DIAGNOSIS — G43109 Migraine with aura, not intractable, without status migrainosus: Secondary | ICD-10-CM | POA: Diagnosis not present

## 2020-07-22 MED ORDER — AIMOVIG 140 MG/ML ~~LOC~~ SOAJ: 140.0000 mg | SUBCUTANEOUS | 11 refills | Status: DC

## 2020-07-22 MED ORDER — SUMATRIPTAN SUCCINATE 6 MG/0.5ML ~~LOC~~ SOLN: SUBCUTANEOUS | 5 refills | Status: DC

## 2020-07-22 MED ORDER — ONDANSETRON 8 MG PO TBDP: 8.0000 mg | ORAL_TABLET | Freq: Three times a day (TID) | ORAL | 5 refills | Status: DC | PRN

## 2020-07-22 NOTE — Patient Instructions (Signed)
1.  Increase Aimovig to 140mg  every 28 days 2.  Still use rizatriptan when you can treat at earliest onset of migraine 3.  If you wake up with migraine, use sumatriptan 6mg  injection.  May repeat in one hour (maximum 2 injections in 24 hours).  Sumatriptan and rizatriptan should not be taken within 24 hours of each other 4.  Use ondansetron dissolvable tablet for nausea 5.  Limit use of pain relievers to no more than 2 days out of week to prevent risk of rebound or medication-overuse headache. 6.  Keep headache diary 7.  Follow up in 6 months.

## 2020-09-01 ENCOUNTER — Encounter (HOSPITAL_BASED_OUTPATIENT_CLINIC_OR_DEPARTMENT_OTHER): Payer: Self-pay

## 2020-09-01 ENCOUNTER — Emergency Department (HOSPITAL_BASED_OUTPATIENT_CLINIC_OR_DEPARTMENT_OTHER)
Admission: EM | Admit: 2020-09-01 | Discharge: 2020-09-02 | Disposition: A | Discharge status: Other Acute Inpatient Hospital | Payer: Medicare Other | Attending: Emergency Medicine | Admitting: Emergency Medicine

## 2020-09-01 ENCOUNTER — Other Ambulatory Visit: Payer: Self-pay

## 2020-09-01 DIAGNOSIS — Z79899 Other long term (current) drug therapy: Secondary | ICD-10-CM | POA: Insufficient documentation

## 2020-09-01 DIAGNOSIS — I129 Hypertensive chronic kidney disease with stage 1 through stage 4 chronic kidney disease, or unspecified chronic kidney disease: Secondary | ICD-10-CM | POA: Diagnosis not present

## 2020-09-01 DIAGNOSIS — N183 Chronic kidney disease, stage 3 unspecified: Secondary | ICD-10-CM | POA: Diagnosis not present

## 2020-09-01 DIAGNOSIS — E1140 Type 2 diabetes mellitus with diabetic neuropathy, unspecified: Secondary | ICD-10-CM | POA: Insufficient documentation

## 2020-09-01 DIAGNOSIS — E1165 Type 2 diabetes mellitus with hyperglycemia: Secondary | ICD-10-CM | POA: Insufficient documentation

## 2020-09-01 DIAGNOSIS — F32A Depression, unspecified: Secondary | ICD-10-CM

## 2020-09-01 DIAGNOSIS — F419 Anxiety disorder, unspecified: Secondary | ICD-10-CM | POA: Diagnosis not present

## 2020-09-01 DIAGNOSIS — F329 Major depressive disorder, single episode, unspecified: Secondary | ICD-10-CM | POA: Diagnosis not present

## 2020-09-01 DIAGNOSIS — E1122 Type 2 diabetes mellitus with diabetic chronic kidney disease: Secondary | ICD-10-CM | POA: Insufficient documentation

## 2020-09-01 DIAGNOSIS — I2510 Atherosclerotic heart disease of native coronary artery without angina pectoris: Secondary | ICD-10-CM | POA: Diagnosis not present

## 2020-09-01 DIAGNOSIS — Z20822 Contact with and (suspected) exposure to covid-19: Secondary | ICD-10-CM | POA: Insufficient documentation

## 2020-09-01 LAB — CBC WITH DIFFERENTIAL/PLATELET
Abs Immature Granulocytes: 0.03 10*3/uL (ref 0.00–0.07)
Basophils Absolute: 0 10*3/uL (ref 0.0–0.1)
Basophils Relative: 0 %
Eosinophils Absolute: 0.1 10*3/uL (ref 0.0–0.5)
Eosinophils Relative: 1 %
HCT: 37.6 % — ABNORMAL LOW (ref 39.0–52.0)
Hemoglobin: 12.1 g/dL — ABNORMAL LOW (ref 13.0–17.0)
Immature Granulocytes: 1 %
Lymphocytes Relative: 19 %
Lymphs Abs: 0.9 10*3/uL (ref 0.7–4.0)
MCH: 32.1 pg (ref 26.0–34.0)
MCHC: 32.2 g/dL (ref 30.0–36.0)
MCV: 99.7 fL (ref 80.0–100.0)
Monocytes Absolute: 0.6 10*3/uL (ref 0.1–1.0)
Monocytes Relative: 12 %
Neutro Abs: 3.2 10*3/uL (ref 1.7–7.7)
Neutrophils Relative %: 67 %
Platelets: 204 10*3/uL (ref 150–400)
RBC: 3.77 MIL/uL — ABNORMAL LOW (ref 4.22–5.81)
RDW: 13.1 % (ref 11.5–15.5)
WBC: 4.7 10*3/uL (ref 4.0–10.5)
nRBC: 0 % (ref 0.0–0.2)

## 2020-09-01 LAB — COMPREHENSIVE METABOLIC PANEL
ALT: 30 U/L (ref 0–44)
AST: 47 U/L — ABNORMAL HIGH (ref 15–41)
Albumin: 3.2 g/dL — ABNORMAL LOW (ref 3.5–5.0)
Alkaline Phosphatase: 107 U/L (ref 38–126)
Anion gap: 9 (ref 5–15)
BUN: 14 mg/dL (ref 8–23)
CO2: 29 mmol/L (ref 22–32)
Calcium: 8.4 mg/dL — ABNORMAL LOW (ref 8.9–10.3)
Chloride: 101 mmol/L (ref 98–111)
Creatinine, Ser: 1.15 mg/dL (ref 0.61–1.24)
GFR calc Af Amer: >60 mL/min (ref >60)
GFR calc non Af Amer: >60 mL/min (ref >60)
Glucose, Bld: 190 mg/dL — ABNORMAL HIGH (ref 70–99)
Potassium: 4.3 mmol/L (ref 3.5–5.1)
Sodium: 139 mmol/L (ref 135–145)
Total Bilirubin: 0.3 mg/dL (ref 0.3–1.2)
Total Protein: 6.9 g/dL (ref 6.5–8.1)

## 2020-09-01 LAB — RAPID URINE DRUG SCREEN, HOSP PERFORMED
Amphetamines: NOT DETECTED
Barbiturates: NOT DETECTED
Benzodiazepines: POSITIVE — AB
Cocaine: NOT DETECTED
Opiates: NOT DETECTED
Tetrahydrocannabinol: NOT DETECTED

## 2020-09-01 LAB — ETHANOL: Alcohol, Ethyl (B): <10 mg/dL (ref <10)

## 2020-09-01 LAB — SARS CORONAVIRUS 2 BY RT PCR (HOSPITAL ORDER, PERFORMED IN ~~LOC~~ HOSPITAL LAB): SARS Coronavirus 2: NEGATIVE

## 2020-09-01 NOTE — ED Notes (Signed)
Patient roomed to room 13 and given warm blanket.

## 2020-09-01 NOTE — ED Triage Notes (Signed)
Pt c/o increase in anxiety since last week and chronic back pain-states he was seen by PCP last week-was advised via phone today by psychiatrist to come to ED-pt denies SI/HI-NAD-steady gait
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
## 2020-09-02 ENCOUNTER — Ambulatory Visit (HOSPITAL_COMMUNITY)
Admission: EM | Admit: 2020-09-02 | Discharge: 2020-09-03 | Disposition: A | Discharge status: Home / Self Care | Payer: Medicare Other | Source: Intra-hospital | Attending: Psychiatric/Mental Health | Admitting: Psychiatric/Mental Health

## 2020-09-02 ENCOUNTER — Encounter (HOSPITAL_BASED_OUTPATIENT_CLINIC_OR_DEPARTMENT_OTHER): Payer: Self-pay | Admitting: Registered Nurse

## 2020-09-02 ENCOUNTER — Other Ambulatory Visit: Payer: Self-pay

## 2020-09-02 DIAGNOSIS — N289 Disorder of kidney and ureter, unspecified: Secondary | ICD-10-CM | POA: Diagnosis not present

## 2020-09-02 DIAGNOSIS — F411 Generalized anxiety disorder: Secondary | ICD-10-CM | POA: Insufficient documentation

## 2020-09-02 DIAGNOSIS — F329 Major depressive disorder, single episode, unspecified: Secondary | ICD-10-CM

## 2020-09-02 DIAGNOSIS — R269 Unspecified abnormalities of gait and mobility: Secondary | ICD-10-CM | POA: Diagnosis not present

## 2020-09-02 DIAGNOSIS — Z21 Asymptomatic human immunodeficiency virus [HIV] infection status: Secondary | ICD-10-CM | POA: Insufficient documentation

## 2020-09-02 DIAGNOSIS — E119 Type 2 diabetes mellitus without complications: Secondary | ICD-10-CM | POA: Diagnosis not present

## 2020-09-02 DIAGNOSIS — F332 Major depressive disorder, recurrent severe without psychotic features: Secondary | ICD-10-CM | POA: Diagnosis present

## 2020-09-02 DIAGNOSIS — R42 Dizziness and giddiness: Secondary | ICD-10-CM | POA: Diagnosis not present

## 2020-09-02 DIAGNOSIS — Z79899 Other long term (current) drug therapy: Secondary | ICD-10-CM | POA: Diagnosis not present

## 2020-09-02 DIAGNOSIS — I10 Essential (primary) hypertension: Secondary | ICD-10-CM | POA: Diagnosis not present

## 2020-09-02 DIAGNOSIS — Z7901 Long term (current) use of anticoagulants: Secondary | ICD-10-CM | POA: Diagnosis not present

## 2020-09-02 DIAGNOSIS — F419 Anxiety disorder, unspecified: Secondary | ICD-10-CM | POA: Diagnosis not present

## 2020-09-02 MED ORDER — ABACAVIR-DOLUTEGRAVIR-LAMIVUD 600-50-300 MG PO TABS
1.0000 | ORAL_TABLET | Freq: Every day | ORAL | Status: DC
  Administered 2020-09-02: 1 via ORAL
  Filled 2020-09-02 (×3): qty 1

## 2020-09-02 MED ORDER — CLONAZEPAM 0.5 MG PO TABS
0.5000 mg | ORAL_TABLET | Freq: Three times a day (TID) | ORAL | Status: DC
  Administered 2020-09-02 – 2020-09-03 (×3): 0.5 mg via ORAL
  Filled 2020-09-02 (×3): qty 1

## 2020-09-02 MED ORDER — GABAPENTIN 300 MG PO CAPS
600.0000 mg | ORAL_CAPSULE | Freq: Three times a day (TID) | ORAL | Status: DC
  Administered 2020-09-02 – 2020-09-03 (×3): 600 mg via ORAL
  Filled 2020-09-02 (×3): qty 2

## 2020-09-02 MED ORDER — BUSPIRONE HCL 10 MG PO TABS
10.0000 mg | ORAL_TABLET | Freq: Three times a day (TID) | ORAL | Status: DC
  Filled 2020-09-02: qty 1

## 2020-09-02 MED ORDER — EZETIMIBE 10 MG PO TABS
10.0000 mg | ORAL_TABLET | Freq: Every day | ORAL | Status: DC
  Administered 2020-09-02 – 2020-09-03 (×2): 10 mg via ORAL
  Filled 2020-09-02 (×2): qty 1

## 2020-09-02 MED ORDER — ACETAMINOPHEN 325 MG PO TABS: 650.0000 mg | ORAL_TABLET | Freq: Four times a day (QID) | ORAL | Status: DC | PRN

## 2020-09-02 MED ORDER — TAMSULOSIN HCL 0.4 MG PO CAPS
0.4000 mg | ORAL_CAPSULE | Freq: Every day | ORAL | Status: DC
  Filled 2020-09-02 (×2): qty 1

## 2020-09-02 MED ORDER — ALUM & MAG HYDROXIDE-SIMETH 200-200-20 MG/5ML PO SUSP: 30.0000 mL | ORAL | Status: DC | PRN

## 2020-09-02 MED ORDER — TIZANIDINE HCL 2 MG PO TABS
2.0000 mg | ORAL_TABLET | Freq: Every day | ORAL | Status: DC
  Administered 2020-09-02: 2 mg via ORAL
  Filled 2020-09-02 (×2): qty 1

## 2020-09-02 MED ORDER — PANTOPRAZOLE SODIUM 40 MG PO TBEC
40.0000 mg | DELAYED_RELEASE_TABLET | Freq: Every day | ORAL | Status: DC
  Administered 2020-09-02 – 2020-09-03 (×2): 40 mg via ORAL
  Filled 2020-09-02 (×2): qty 1

## 2020-09-02 MED ORDER — PROPRANOLOL HCL 10 MG PO TABS
10.0000 mg | ORAL_TABLET | Freq: Three times a day (TID) | ORAL | Status: DC
  Administered 2020-09-02 – 2020-09-03 (×3): 10 mg via ORAL
  Filled 2020-09-02 (×3): qty 1

## 2020-09-02 MED ORDER — TRAZODONE HCL 50 MG PO TABS: 50.0000 mg | ORAL_TABLET | Freq: Every evening | ORAL | Status: DC | PRN

## 2020-09-02 MED ORDER — ISOSORBIDE MONONITRATE ER 30 MG PO TB24
60.0000 mg | ORAL_TABLET | Freq: Every day | ORAL | Status: DC
  Administered 2020-09-02 – 2020-09-03 (×2): 60 mg via ORAL
  Filled 2020-09-02 (×2): qty 2

## 2020-09-02 MED ORDER — HYDROXYZINE HCL 25 MG PO TABS
25.0000 mg | ORAL_TABLET | Freq: Three times a day (TID) | ORAL | Status: DC | PRN
  Administered 2020-09-02: 25 mg via ORAL
  Filled 2020-09-02: qty 1

## 2020-09-02 MED ORDER — NAPROXEN 250 MG PO TABS: 250.0000 mg | ORAL_TABLET | Freq: Two times a day (BID) | ORAL | Status: DC | PRN

## 2020-09-02 MED ORDER — BUSPIRONE HCL 15 MG PO TABS
15.0000 mg | ORAL_TABLET | Freq: Three times a day (TID) | ORAL | Status: DC
  Administered 2020-09-02 – 2020-09-03 (×3): 15 mg via ORAL
  Filled 2020-09-02 (×3): qty 1

## 2020-09-02 MED ORDER — MAGNESIUM HYDROXIDE 400 MG/5ML PO SUSP: 30.0000 mL | Freq: Every day | ORAL | Status: DC | PRN

## 2020-09-02 MED ORDER — LORATADINE 10 MG PO TABS
10.0000 mg | ORAL_TABLET | Freq: Every day | ORAL | Status: DC
  Administered 2020-09-02: 10 mg via ORAL
  Filled 2020-09-02: qty 1

## 2020-09-02 MED ORDER — CLONAZEPAM 0.5 MG PO TABS
0.5000 mg | ORAL_TABLET | Freq: Three times a day (TID) | ORAL | Status: DC | PRN
  Filled 2020-09-02: qty 1

## 2020-09-02 NOTE — ED Notes (Signed)
TTS in process 

## 2020-09-02 NOTE — ED Notes (Signed)
Meal given to patient. We do not have meds ordered in pyxis and cant override. Ordering medications

## 2020-09-02 NOTE — Progress Notes (Signed)
Estus skin assessment was completed with Ragine, MHT in the search room. No noted scars, etc.

## 2020-09-02 NOTE — ED Notes (Signed)
Pt ambulated to restroom and back  Tolerated well  No acute distress noted

## 2020-09-02 NOTE — ED Notes (Signed)
Pt presented to behavioral health urgent care with c/o of anxiety.  Pt  Is anxious @this  time Prn medications were given. Food was offered. Pt is resting and watching television. Will continue to monitor for increasing anxiery

## 2020-09-02 NOTE — Progress Notes (Signed)
Per Earleen Newport, NP the patient is recommended to be transferred to the [facility] for continuous observation.   Number for nurse to nurse report is [phone].   Lupe Carney, RN notified.    Radonna Ricker, MSW, LCSW Clinical Social Worker [place]

## 2020-09-02 NOTE — ED Notes (Signed)
Pt ambulatory with steady gait to restroom 

## 2020-09-02 NOTE — ED Notes (Signed)
Called [facility] regarding bed arrangements for patient. We do not have medications ordered and inquiring on whether to have them delivered from [facility]. Unable to leave a message due to the mailbox being full. Patient cooperative and good with plan. He ate his lunch
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
## 2020-09-02 NOTE — Consult Note (Signed)
Telepsych Consultation   Reason for Consult:  Anxiety Referring Physician:  Maudie Flakes, MD Location of Patient: [facility] Location of Provider: Other: [facility]  Patient Identification: Brian Malone MRN:  [id number] Principal Diagnosis: Anxiety Diagnosis:  Principal Problem:   Anxiety   Total Time spent with patient: 30 minutes  Subjective:   Brian Malone is a 68 y.o. male patient presented to [facility] with complaints of worsening anxiety for last several days and was unable to see his primary psychiatrist who told him to go to the hospital.  HPI:  Brian Malone, 69 y.o., male patient seen via tele psych by this provider, Dr. Dwyane Malone; and chart reviewed on 09/02/20.  On evaluation Brian Malone reports he came to the hospital because "I kinda lost control.  I got really anxious and was feeling anxious and  Hopeless.  My next door neighbor who is my friend moved and I my niece diad last week and both really bothered me.  It was just a couple of things happened and I just fell apart and I'm not feeling much better today.  I called Dr. Erling Malone and he said he wanted to see me one day this week but did not have any openings so he told me to go to the hospital."  Patient states that he lives alone and current doesn't have any one close by for support.  States outpatient psychiatric services with Dr. Erling Malone but "they don't have a therapist on staff."   States that he has had therapy in the past and it help with coping skills "but I haven't seen nobody since I lost my last therapy.  I have peer support that comes out to the house and they help me do things but don't really help with coping skills."  When patient asked about his safety and the safety of others (suicidal/homicidal) patient stated "I don't want to do anything to hurt or kill myself." and patient also denied homicidal ideation; but when started discussing resources for patient he stated "I don't want to be a lone.  I need to be in a safe  place."  Patient asked what he ment by safe place and if he needed safety from himself or someone else; patient responded "I don't like being by myself.  If I leave I won't have someone around.  I don't want to hurt myself but can't say it not an aspect."  Patient would give no further explanation.  Patient states that he has been involved in day programs before to be around people but states he doesn't at this time and that it helped some but not much."  During evaluation Brian Malone is alert/oriented x 4; calm/cooperative; and mood is congruent with affect.  He does not appear to be responding to internal/external stimuli or delusional thoughts.  Patient denies harm/homicidal ideation, psychosis, and paranoia; but vague suicidal ideation with no plan or intent; just stating he doesn't want to be alone and feels safe when around people.  Patient reported he was compliant with medication regimen and with psychiatric follow up appointments.  Patient accepted to [facility] for continuous assessment since unable to contract for safety; will observe overnight and reassess tomorrow morning.     Past Psychiatric History: Depression, anxiety  Risk to Self:  Denies vaguely  Risk to Others:  Denies Prior Inpatient Therapy:  Yes Prior Outpatient Therapy:  Yes  Past Medical History:  Past Medical History:  Diagnosis Date  . Anxiety   .  Diabetes mellitus without complication ([place])   . HIV (human immunodeficiency virus infection) ([place])   . Hypertension   . Renal disorder     Past Surgical History:  Procedure Laterality Date  . ANKLE ARTHROSCOPY    . APPENDECTOMY    . BACK SURGERY     FUSION  . PENILE PROSTHESIS  REMOVAL  05/2020   Removal due to abscess  . PENILE PROSTHESIS PLACEMENT  04/2020  . THORACOTOMY Right 2008  ?  . TONSILLECTOMY     Family History:  Family History  Problem Relation Age of Onset  . CAD Mother   . Lung cancer Mother   . CAD Father   . Lung cancer Father   . CAD  Brother    Family Psychiatric  History: None reported Social History:  Social History   Substance and Sexual Activity  Alcohol Use Not Currently     Social History   Substance and Sexual Activity  Drug Use Never    Social History   Socioeconomic History  . Marital status: Divorced    Spouse name: Not on file  . Number of children: 2  . Years of education: Not on file  . Highest education level: Bachelor's degree (e.g., BA, AB, BS)  Occupational History  . Not on file  Tobacco Use  . Smoking status: Never Smoker  . Smokeless tobacco: Never Used  Vaping Use  . Vaping Use: Never used  Substance and Sexual Activity  . Alcohol use: Not Currently  . Drug use: Never  . Sexual activity: Not on file  Other Topics Concern  . Not on file  Social History Narrative   Pt is divorced   2 children   Right handed   Drinks soda, no coffee, no tea    Lives on the third floor of an apartment bldg. One story studio apartment   Social Determinants of Health   Financial Resource Strain:   . Difficulty of Paying Living Expenses: Not on file  Food Insecurity:   . Worried About Charity fundraiser in the Last Year: Not on file  . Ran Out of Food in the Last Year: Not on file  Transportation Needs:   . Lack of Transportation (Medical): Not on file  . Lack of Transportation (Non-Medical): Not on file  Physical Activity:   . Days of Exercise per Week: Not on file  . Minutes of Exercise per Session: Not on file  Stress:   . Feeling of Stress : Not on file  Social Connections:   . Frequency of Communication with Friends and Family: Not on file  . Frequency of Social Gatherings with Friends and Family: Not on file  . Attends Religious Services: Not on file  . Active Member of Clubs or Organizations: Not on file  . Attends Archivist Meetings: Not on file  . Marital Status: Not on file   Additional Social History:    Allergies:   Allergies  Allergen Reactions  .  Adhesive [Tape] Other (See Comments)    "Tape will take off my skin, as will Band-Aids"  . Cyclobenzaprine Other (See Comments)    Hallucinations  . Duloxetine Hcl Other (See Comments)    Makes PTSD- induced nightmares more vivid Makes PTSD- induced nightmares more vivid Makes PTSD- induced nightmares more vivid  . Morphine Itching and Other (See Comments)    Hallucinations, aggression, and makes patient altered also      Labs:  Results for orders placed or  performed during the hospital encounter of 09/01/20 (from the past 48 hour(s))  SARS Coronavirus 2 by RT PCR (hospital order, performed in [facility] hospital lab) Nasopharyngeal Nasopharyngeal Swab     Status: None   Collection Time: 09/01/20  8:00 PM   Specimen: Nasopharyngeal Swab  Result Value Ref Range   SARS Coronavirus 2 NEGATIVE NEGATIVE    Comment: (NOTE) SARS-CoV-2 target nucleic acids are NOT DETECTED.  The SARS-CoV-2 RNA is generally detectable in upper and lower respiratory specimens during the acute phase of infection. The lowest concentration of SARS-CoV-2 viral copies this assay can detect is 250 copies / mL. A negative result does not preclude SARS-CoV-2 infection and should not be used as the sole basis for treatment or other patient management decisions.  A negative result may occur with improper specimen collection / handling, submission of specimen other than nasopharyngeal swab, presence of viral mutation(s) within the areas targeted by this assay, and inadequate number of viral copies (<250 copies / mL). A negative result must be combined with clinical observations, patient history, and epidemiological information.  Fact Sheet for Patients:   StrictlyIdeas.no  Fact Sheet for Healthcare Providers: BankingDealers.co.za  This test is not yet approved or  cleared by the [place] FDA and has been authorized for detection and/or diagnosis of SARS-CoV-2  by FDA under an Emergency Use Authorization (EUA).  This EUA will remain in effect (meaning this test can be used) for the duration of the COVID-19 declaration under Section 564(b)(1) of the Act, 21 U.S.C. section 360bbb-3(b)(1), unless the authorization is terminated or revoked sooner.  Performed at [facility], [address] [place], [place] [address]   Comprehensive metabolic panel     Status: Abnormal   Collection Time: 09/01/20  8:00 PM  Result Value Ref Range   Sodium 139 135 - 145 mmol/L   Potassium 4.3 3.5 - 5.1 mmol/L   Chloride 101 98 - 111 mmol/L   CO2 29 22 - 32 mmol/L   Glucose, Bld 190 (H) 70 - 99 mg/dL    Comment: Glucose reference range applies only to samples taken after fasting for at least 8 hours.   BUN 14 8 - 23 mg/dL   Creatinine, Ser 1.15 0.61 - 1.24 mg/dL   Calcium 8.4 (L) 8.9 - 10.3 mg/dL   Total Protein 6.9 6.5 - 8.1 g/dL   Albumin 3.2 (L) 3.5 - 5.0 g/dL   AST 47 (H) 15 - 41 U/L   ALT 30 0 - 44 U/L   Alkaline Phosphatase 107 38 - 126 U/L   Total Bilirubin 0.3 0.3 - 1.2 mg/dL   GFR calc non Af Amer >60 >60 mL/min   GFR calc Af Amer >60 >60 mL/min   Anion gap 9 5 - 15    Comment: Performed at [facility], [address] [place], [place] [address]  Ethanol     Status: None   Collection Time: 09/01/20  8:00 PM  Result Value Ref Range   Alcohol, Ethyl (B) <10 <10 mg/dL    Comment: (NOTE) Lowest detectable limit for serum alcohol is 10 mg/dL.  For medical purposes only. Performed at [facility], [address] [place], [place] [address]   CBC with Diff     Status: Abnormal   Collection Time: 09/01/20  8:00 PM  Result Value Ref Range   WBC 4.7 4.0 - 10.5 K/uL   RBC 3.77 (L) 4.22 - 5.81 MIL/uL  Hemoglobin 12.1 (L) 13.0 - 17.0 g/dL   HCT 37.6 (L) 39 - 52 %   MCV 99.7 80.0 - 100.0 fL   MCH 32.1 26.0 - 34.0 pg   MCHC 32.2 30.0 - 36.0 g/dL   RDW 13.1 11.5 - 15.5 %   Platelets 204 150 - 400 K/uL   nRBC 0.0  0.0 - 0.2 %   Neutrophils Relative % 67 %   Neutro Abs 3.2 1.7 - 7.7 K/uL   Lymphocytes Relative 19 %   Lymphs Abs 0.9 0.7 - 4.0 K/uL   Monocytes Relative 12 %   Monocytes Absolute 0.6 0 - 1 K/uL   Eosinophils Relative 1 %   Eosinophils Absolute 0.1 0 - 0 K/uL   Basophils Relative 0 %   Basophils Absolute 0.0 0 - 0 K/uL   Immature Granulocytes 1 %   Abs Immature Granulocytes 0.03 0.00 - 0.07 K/uL    Comment: Performed at [facility], [address] [place], [place] [address]  Urine rapid drug screen (hosp performed)     Status: Abnormal   Collection Time: 09/01/20  8:01 PM  Result Value Ref Range   Opiates NONE DETECTED NONE DETECTED   Cocaine NONE DETECTED NONE DETECTED   Benzodiazepines POSITIVE (A) NONE DETECTED   Amphetamines NONE DETECTED NONE DETECTED   Tetrahydrocannabinol NONE DETECTED NONE DETECTED   Barbiturates NONE DETECTED NONE DETECTED    Comment: (NOTE) DRUG SCREEN FOR MEDICAL PURPOSES ONLY.  IF CONFIRMATION IS NEEDED FOR ANY PURPOSE, NOTIFY LAB WITHIN 5 DAYS.  LOWEST DETECTABLE LIMITS FOR URINE DRUG SCREEN Drug Class                     Cutoff (ng/mL) Amphetamine and metabolites    1000 Barbiturate and metabolites    200 Benzodiazepine                 902 Tricyclics and metabolites     300 Opiates and metabolites        300 Cocaine and metabolites        300 THC                            50 Performed at [facility], [address] [place], [place] [address]     Medications:  Current Facility-Administered Medications  Medication Dose Route Frequency Provider Last Rate Last Admin  . busPIRone (BUSPAR) tablet 10 mg  10 mg Oral TID Breck Coons, MD      . clonazePAM Bobbye [place]) tablet 0.5 mg  0.5 mg Oral TID PRN Breck Coons, MD       Current Outpatient Medications  Medication Sig Dispense Refill  . acetaminophen (TYLENOL) 500 MG tablet Take 1,000 mg by mouth 3 (three) times daily.     . busPIRone (BUSPAR) 10 MG tablet Take 10  mg by mouth 3 (three) times daily.     . cetirizine (ZYRTEC) 10 MG tablet Take 10 mg by mouth daily.    . clindamycin (CLEOCIN) 300 MG capsule Take 1 capsule (300 mg total) by mouth 3 (three) times daily. (Patient not taking: Reported on 07/22/2020) 21 capsule 0  . clonazePAM (KLONOPIN) 0.5 MG tablet Take 0.5 mg by mouth 3 (three) times daily.    . colchicine 0.6 MG tablet Take 0.6 mg by mouth daily as needed (as directed for gout flares).    . Cyanocobalamin (VITAMIN B-12)  5000 MCG TBDP Take 5,000 mcg by mouth daily.  (Patient not taking: Reported on 07/22/2020)    . Erenumab-aooe (AIMOVIG) 140 MG/ML SOAJ Inject 140 mg into the skin every 28 (twenty-eight) days. 1 pen 11  . ezetimibe (ZETIA) 10 MG tablet Take 10 mg by mouth daily.    Marland Kitchen gabapentin (NEURONTIN) 300 MG capsule Take 2 capsules (600 mg total) by mouth 3 (three) times daily.    Marland Kitchen HYDROcodone-acetaminophen (NORCO/VICODIN) 5-325 MG tablet Take 1 tablet by mouth every 6 (six) hours as needed. (Patient not taking: Reported on 07/22/2020) 6 tablet 0  . isosorbide mononitrate (IMDUR) 60 MG 24 hr tablet Take 1 tablet (60 mg total) by mouth daily. 90 tablet 3  . loperamide (IMODIUM A-D) 2 MG tablet Take 1 tablet (2 mg total) by mouth 4 (four) times daily as needed for diarrhea or loose stools. (Patient not taking: Reported on 07/22/2020) 30 tablet 0  . ondansetron (ZOFRAN ODT) 8 MG disintegrating tablet Take 1 tablet (8 mg total) by mouth every 8 (eight) hours as needed for nausea or vomiting. 20 tablet 5  . OZEMPIC, 0.25 OR 0.5 MG/DOSE, 2 MG/1.5ML SOPN Inject 0.5 mg into the skin once a week. Taking on Friday    . pantoprazole (PROTONIX) 40 MG tablet Take 40 mg by mouth daily before breakfast.     . rizatriptan (MAXALT) 10 MG tablet Take 10 mg by mouth as needed for migraine.     . SUMAtriptan (IMITREX) 6 MG/0.5ML SOLN injection 1 injection into skin.  May repeat in 1 hour if headache persists or recurs.  Maximum 2 injections in 24 hours 15 mL 5  .  tamsulosin (FLOMAX) 0.4 MG CAPS capsule Take 0.4 mg by mouth at bedtime.     Marland Kitchen tiZANidine (ZANAFLEX) 4 MG tablet Take 4 mg by mouth at bedtime.     . topiramate (TOPAMAX) 50 MG tablet Take 50 mg by mouth 2 (two) times daily.     . TRIUMEQ 600-50-300 MG tablet Take 1 tablet by mouth daily.     . valACYclovir (VALTREX) 1000 MG tablet Take 1,000 mg by mouth 3 (three) times daily as needed (as directed for trigeminal neuralgia flares). Trig neuralgia flare    . Vitamin D, Ergocalciferol, (DRISDOL) 1.25 MG (50000 UT) CAPS capsule Take 50,000 Units by mouth every Thursday.  (Patient not taking: Reported on 07/22/2020)      Musculoskeletal: Strength & Muscle Tone: within normal limits Gait & Station: normal Patient leans: N/A  Psychiatric Specialty Exam: Physical Exam Constitutional:      Appearance: Normal appearance.  Pulmonary:     Effort: Pulmonary effort is normal.  Musculoskeletal:        General: Normal range of motion.  Neurological:     Mental Status: He is alert.  Psychiatric:        Attention and Perception: Attention and perception normal.        Mood and Affect: Affect normal. Mood is depressed.        Speech: Speech normal.        Behavior: Behavior normal. Behavior is cooperative.        Thought Content: Thought content normal. Thought content is not paranoid or delusional. Thought content does not include homicidal or suicidal ideation.        Cognition and Memory: Cognition and memory normal.        Judgment: Judgment normal.     Review of Systems  Psychiatric/Behavioral: Negative for agitation, self-injury, sleep disturbance  and suicidal ideas. Hyperactive: Stable.        Patient denies suicidal/self-harm/homicidal ideation, psychosis, and paranoia. But the states he needs to be in a safe place   All other systems reviewed and are negative.   Blood pressure (!) 169/107, pulse 69, temperature 99 F (37.2 C), temperature source Oral, resp. rate 16, height 6' (1.829 m),  weight 75.8 kg, SpO2 98 %.Body mass index is 22.65 kg/m.  General Appearance: Casual  Eye Contact:  Good  Speech:  Clear and Coherent and Normal Rate  Volume:  Normal  Mood:  Anxious and Depressed  Affect:  Congruent  Thought Process:  Coherent, Goal Directed and Descriptions of Associations: Intact  Orientation:  Full (Time, Place, and Person)  Thought Content:  WDL  Suicidal Thoughts:  Vaguely denies suicidal ideation but unwilling to contract for safety  Homicidal Thoughts:  No  Memory:  Immediate;   Good Recent;   Good  Judgement:  Fair  Insight:  Present  Psychomotor Activity:  Normal  Concentration:  Concentration: Fair and Attention Span: Fair  Recall:  Good  Fund of Knowledge:  Good  Language:  Good  Akathisia:  No  Handed:  Right  AIMS (if indicated):     Assets:  Communication Skills Desire for Improvement Housing  ADL's:  Intact  Cognition:  WNL  Sleep:        Treatment Plan Summary: Plan Transfer to [place] for admission to continuous assessment unit  Disposition: No evidence of imminent risk to self or others at present.   Supportive therapy provided about ongoing stressors. Discussed crisis plan, support from social network, calling 911, coming to the Emergency Department, and calling Suicide Hotline.  This service was provided via telemedicine using a 2-way, interactive audio and video technology.  Names of all persons participating in this telemedicine service and their role in this encounter. Name: Earleen Newport Role: NP  Name: Dr. Dwyane Malone Role: Psychiatrist  Name: [name] Role: Patient  Name:  Role:     Earleen Newport, NP 09/02/2020 11:56 AM

## 2020-09-02 NOTE — ED Notes (Signed)
TTS in progress 

## 2020-09-02 NOTE — ED Notes (Signed)
Patient belongings in locker # 5.

## 2020-09-02 NOTE — ED Notes (Signed)
Pt sleeping @this  time. breathing even and unlabored.

## 2020-09-02 NOTE — ED Provider Notes (Signed)
Behavioral Health Admission H&P [facility] & OBS)  Date: 09/02/20 Patient Name: Brian Malone MRN: [id number] Chief Complaint:  Chief Complaint  Patient presents with  . urgent emergent eval      Diagnoses:  Final diagnoses:  Severe episode of recurrent major depressive disorder, without psychotic features ([place])  Generalized anxiety disorder    HPI: Per psychiatric evaluation this morning: On evaluation Brian Malone reports he came to the hospital because "I kinda lost control.  I got really anxious and was feeling anxious and  Hopeless.  My next door neighbor who is my friend moved and I my niece diad last week and both really bothered me.  It was just a couple of things happened and I just fell apart and I'm not feeling much better today.  I called Dr. Erling Cruz and he said he wanted to see me one day this week but did not have any openings so he told me to go to the hospital."  Patient states that he lives alone and current doesn't have any one close by for support.  States outpatient psychiatric services with Dr. Erling Cruz but "they don't have a therapist on staff."   States that he has had therapy in the past and it help with coping skills "but I haven't seen nobody since I lost my last therapy.  I have peer support that comes out to the house and they help me do things but don't really help with coping skills."  When patient asked about his safety and the safety of others (suicidal/homicidal) patient stated "I don't want to do anything to hurt or kill myself." and patient also denied homicidal ideation; but when started discussing resources for patient he stated "I don't want to be a lone.  I need to be in a safe place."  Patient asked what he ment by safe place and if he needed safety from himself or someone else; patient responded "I don't like being by myself.  If I leave I won't have someone around.  I don't want to hurt myself but can't say it not an aspect."  Patient would give no further  explanation.  Patient states that he has been involved in day programs before to be around people but states he doesn't at this time and that it helped some but not much."  On admission to [facility], patient appears disheveled and reports anxious and depressed mood related to stressors identified above. He denies any SI, states "I just don't feel like living" but denies any thoughts of wanting to kill himself. He denies HI/AVH. He shows no signs of responding to internal stimuli. He feels anxiety is triggering his depression. He reports compliance with gabapentin 600 mg TID and Buspar 10 mg TID. He states he has had "bad reactions" to antidepressants in the past, such as hallucinations. He states propranolol has been helpful for his anxiety in the past. Patient did not receive any of his home medications in the ED today and has elevated blood pressure and heart rate at this time. I have reviewed home medications with patient and re-ordered home medications to be started now.  PHQ 2-9:     Total Time spent with patient: 30 minutes  Musculoskeletal  Strength & Muscle Tone: within normal limits Gait & Station: normal Patient leans: N/A  Psychiatric Specialty Exam  Presentation General Appearance: Disheveled  Eye Contact:Minimal  Speech:Clear and Coherent;Normal Rate  Speech Volume:Normal  Handedness:No data recorded  Mood and Affect  Mood:Anxious;Depressed  Affect:Congruent  Thought Process  Thought Processes:Coherent;Goal Directed  Descriptions of Associations:Intact  Orientation:Full (Time, Place and Person)  Thought Content:Logical  Hallucinations:Hallucinations: None  Ideas of Reference:None  Suicidal Thoughts:Suicidal Thoughts: No  Homicidal Thoughts:Homicidal Thoughts: No   Sensorium  Memory:Immediate Fair;Recent Fair;Remote Fair  Judgment:Fair  Insight:Fair   Executive Functions  Concentration:Fair  Attention Span:Fair  [place]   Psychomotor Activity  Psychomotor Activity:Psychomotor Activity: Decreased   Assets  Assets:Communication Skills;Desire for Improvement;Housing   Sleep  Sleep:Sleep: Fair   Physical Exam Vitals and nursing note reviewed.  Constitutional:      Appearance: He is well-developed.  Pulmonary:     Effort: Pulmonary effort is normal.  Musculoskeletal:        General: Normal range of motion.  Neurological:     Mental Status: He is alert and oriented to person, place, and time.    Review of Systems  Constitutional: Negative.   Respiratory: Negative for cough and shortness of breath.   Cardiovascular: Negative for chest pain.  Neurological: Positive for dizziness. Negative for tremors and headaches.  Psychiatric/Behavioral: Positive for depression. Negative for hallucinations, memory loss, substance abuse and suicidal ideas. The patient is nervous/anxious. The patient does not have insomnia.     Blood pressure (!) 173/118, pulse (!) 118, temperature 97.8 F (36.6 C), temperature source Tympanic, height 6' (1.829 m), weight 74.8 kg, SpO2 100 %. Body mass index is 22.38 kg/m.  Past Psychiatric History: Anxiety and depression  Is the patient at risk to self? No  Has the patient been a risk to self in the past 6 months? No .    Has the patient been a risk to self within the distant past? No   Is the patient a risk to others? No   Has the patient been a risk to others in the past 6 months? No   Has the patient been a risk to others within the distant past? No   Past Medical History:  Past Medical History:  Diagnosis Date  . Anxiety   . Diabetes mellitus without complication ([place])   . HIV (human immunodeficiency virus infection) ([place])   . Hypertension   . Renal disorder     Past Surgical History:  Procedure Laterality Date  . ANKLE ARTHROSCOPY    . APPENDECTOMY    . BACK SURGERY     FUSION  . PENILE PROSTHESIS  REMOVAL  05/2020    Removal due to abscess  . PENILE PROSTHESIS PLACEMENT  04/2020  . THORACOTOMY Right 2008  ?  . TONSILLECTOMY      Family History:  Family History  Problem Relation Age of Onset  . CAD Mother   . Lung cancer Mother   . CAD Father   . Lung cancer Father   . CAD Brother     Social History:  Social History   Socioeconomic History  . Marital status: Divorced    Spouse name: Not on file  . Number of children: 2  . Years of education: Not on file  . Highest education level: Bachelor's degree (e.g., BA, AB, BS)  Occupational History  . Not on file  Tobacco Use  . Smoking status: Never Smoker  . Smokeless tobacco: Never Used  Vaping Use  . Vaping Use: Never used  Substance and Sexual Activity  . Alcohol use: Not Currently  . Drug use: Never  . Sexual activity: Not on file  Other Topics Concern  . Not on file  Social  History Narrative   Pt is divorced   2 children   Right handed   Drinks soda, no coffee, no tea    Lives on the third floor of an apartment bldg. One story studio apartment   Social Determinants of Health   Financial Resource Strain:   . Difficulty of Paying Living Expenses: Not on file  Food Insecurity:   . Worried About Charity fundraiser in the Last Year: Not on file  . Ran Out of Food in the Last Year: Not on file  Transportation Needs:   . Lack of Transportation (Medical): Not on file  . Lack of Transportation (Non-Medical): Not on file  Physical Activity:   . Days of Exercise per Week: Not on file  . Minutes of Exercise per Session: Not on file  Stress:   . Feeling of Stress : Not on file  Social Connections:   . Frequency of Communication with Friends and Family: Not on file  . Frequency of Social Gatherings with Friends and Family: Not on file  . Attends Religious Services: Not on file  . Active Member of Clubs or Organizations: Not on file  . Attends Archivist Meetings: Not on file  . Marital Status: Not on file  Intimate  Partner Violence:   . Fear of Current or Ex-Partner: Not on file  . Emotionally Abused: Not on file  . Physically Abused: Not on file  . Sexually Abused: Not on file    SDOH:  SDOH Screenings   Alcohol Screen:   . Last Alcohol Screening Score (AUDIT): Not on file  Depression (PHQ2-9):   . PHQ-2 Score: Not on file  Financial Resource Strain:   . Difficulty of Paying Living Expenses: Not on file  Food Insecurity:   . Worried About Charity fundraiser in the Last Year: Not on file  . Ran Out of Food in the Last Year: Not on file  Housing:   . Last Housing Risk Score: Not on file  Physical Activity:   . Days of Exercise per Week: Not on file  . Minutes of Exercise per Session: Not on file  Social Connections:   . Frequency of Communication with Friends and Family: Not on file  . Frequency of Social Gatherings with Friends and Family: Not on file  . Attends Religious Services: Not on file  . Active Member of Clubs or Organizations: Not on file  . Attends Archivist Meetings: Not on file  . Marital Status: Not on file  Stress:   . Feeling of Stress : Not on file  Tobacco Use: Low Risk   . Smoking Tobacco Use: Never Smoker  . Smokeless Tobacco Use: Never Used  Transportation Needs:   . Film/video editor (Medical): Not on file  . Lack of Transportation (Non-Medical): Not on file    Last Labs:  Admission on 09/01/2020, Discharged on 09/02/2020  Component Date Value Ref Range Status  . SARS Coronavirus 2 09/01/2020 NEGATIVE  NEGATIVE Final   Comment: (NOTE) SARS-CoV-2 target nucleic acids are NOT DETECTED.  The SARS-CoV-2 RNA is generally detectable in upper and lower respiratory specimens during the acute phase of infection. The lowest concentration of SARS-CoV-2 viral copies this assay can detect is 250 copies / mL. A negative result does not preclude SARS-CoV-2 infection and should not be used as the sole basis for treatment or other patient management  decisions.  A negative result may occur with improper specimen collection / handling,  submission of specimen other than nasopharyngeal swab, presence of viral mutation(s) within the areas targeted by this assay, and inadequate number of viral copies (<250 copies / mL). A negative result must be combined with clinical observations, patient history, and epidemiological information.  Fact Sheet for Patients:   StrictlyIdeas.no  Fact Sheet for Healthcare Providers: BankingDealers.co.za  This test is not yet approved or                           cleared by the [place] FDA and has been authorized for detection and/or diagnosis of SARS-CoV-2 by FDA under an Emergency Use Authorization (EUA).  This EUA will remain in effect (meaning this test can be used) for the duration of the COVID-19 declaration under Section 564(b)(1) of the Act, 21 U.S.C. section 360bbb-3(b)(1), unless the authorization is terminated or revoked sooner.  Performed at [facility], [address] [place], [name] [address]   . Sodium 09/01/2020 139  135 - 145 mmol/L Final  . Potassium 09/01/2020 4.3  3.5 - 5.1 mmol/L Final  . Chloride 09/01/2020 101  98 - 111 mmol/L Final  . CO2 09/01/2020 29  22 - 32 mmol/L Final  . Glucose, Bld 09/01/2020 190* 70 - 99 mg/dL Final   Glucose reference range applies only to samples taken after fasting for at least 8 hours.  . BUN 09/01/2020 14  8 - 23 mg/dL Final  . Creatinine, Ser 09/01/2020 1.15  0.61 - 1.24 mg/dL Final  . Calcium 09/01/2020 8.4* 8.9 - 10.3 mg/dL Final  . Total Protein 09/01/2020 6.9  6.5 - 8.1 g/dL Final  . Albumin 09/01/2020 3.2* 3.5 - 5.0 g/dL Final  . AST 09/01/2020 47* 15 - 41 U/L Final  . ALT 09/01/2020 30  0 - 44 U/L Final  . Alkaline Phosphatase 09/01/2020 107  38 - 126 U/L Final  . Total Bilirubin 09/01/2020 0.3  0.3 - 1.2 mg/dL Final  . GFR calc non Af Amer 09/01/2020 >60  >60 mL/min Final   . GFR calc Af Amer 09/01/2020 >60  >60 mL/min Final  . Anion gap 09/01/2020 9  5 - 15 Final   Performed at [facility], [address] [place], [place] [address]  . Alcohol, Ethyl (B) 09/01/2020 <10  <10 mg/dL Final   Comment: (NOTE) Lowest detectable limit for serum alcohol is 10 mg/dL.  For medical purposes only. Performed at [facility], [address] [place], [place] [address]   . Opiates 09/01/2020 NONE DETECTED  NONE DETECTED Final  . Cocaine 09/01/2020 NONE DETECTED  NONE DETECTED Final  . Benzodiazepines 09/01/2020 POSITIVE* NONE DETECTED Final  . Amphetamines 09/01/2020 NONE DETECTED  NONE DETECTED Final  . Tetrahydrocannabinol 09/01/2020 NONE DETECTED  NONE DETECTED Final  . Barbiturates 09/01/2020 NONE DETECTED  NONE DETECTED Final   Comment: (NOTE) DRUG SCREEN FOR MEDICAL PURPOSES ONLY.  IF CONFIRMATION IS NEEDED FOR ANY PURPOSE, NOTIFY LAB WITHIN 5 DAYS.  LOWEST DETECTABLE LIMITS FOR URINE DRUG SCREEN Drug Class                     Cutoff (ng/mL) Amphetamine and metabolites    1000 Barbiturate and metabolites    200 Benzodiazepine                 361 Tricyclics and metabolites     300 Opiates and metabolites  300 Cocaine and metabolites        300 THC                            50 Performed at [facility], [address] [place], [place] [address]   . WBC 09/01/2020 4.7  4.0 - 10.5 K/uL Final  . RBC 09/01/2020 3.77* 4.22 - 5.81 MIL/uL Final  . Hemoglobin 09/01/2020 12.1* 13.0 - 17.0 g/dL Final  . HCT 09/01/2020 37.6* 39 - 52 % Final  . MCV 09/01/2020 99.7  80.0 - 100.0 fL Final  . MCH 09/01/2020 32.1  26.0 - 34.0 pg Final  . MCHC 09/01/2020 32.2  30.0 - 36.0 g/dL Final  . RDW 09/01/2020 13.1  11.5 - 15.5 % Final  . Platelets 09/01/2020 204  150 - 400 K/uL Final  . nRBC 09/01/2020 0.0  0.0 - 0.2 % Final  . Neutrophils Relative % 09/01/2020 67  % Final  . Neutro Abs 09/01/2020 3.2  1.7 - 7.7 K/uL Final  .  Lymphocytes Relative 09/01/2020 19  % Final  . Lymphs Abs 09/01/2020 0.9  0.7 - 4.0 K/uL Final  . Monocytes Relative 09/01/2020 12  % Final  . Monocytes Absolute 09/01/2020 0.6  0 - 1 K/uL Final  . Eosinophils Relative 09/01/2020 1  % Final  . Eosinophils Absolute 09/01/2020 0.1  0 - 0 K/uL Final  . Basophils Relative 09/01/2020 0  % Final  . Basophils Absolute 09/01/2020 0.0  0 - 0 K/uL Final  . Immature Granulocytes 09/01/2020 1  % Final  . Abs Immature Granulocytes 09/01/2020 0.03  0.00 - 0.07 K/uL Final   Performed at [facility], [address] [place], [place] [address]  Admission on 06/18/2020, Discharged on 06/18/2020  Component Date Value Ref Range Status  . WBC 06/18/2020 2.7* 4.0 - 10.5 K/uL Final  . RBC 06/18/2020 3.69* 4.22 - 5.81 MIL/uL Final  . Hemoglobin 06/18/2020 12.1* 13.0 - 17.0 g/dL Final  . HCT 06/18/2020 37.7* 39 - 52 % Final  . MCV 06/18/2020 102.2* 80.0 - 100.0 fL Final  . MCH 06/18/2020 32.8  26.0 - 34.0 pg Final  . MCHC 06/18/2020 32.1  30.0 - 36.0 g/dL Final  . RDW 06/18/2020 14.3  11.5 - 15.5 % Final  . Platelets 06/18/2020 166  150 - 400 K/uL Final  . nRBC 06/18/2020 0.0  0.0 - 0.2 % Final  . Neutrophils Relative % 06/18/2020 52  % Final  . Neutro Abs 06/18/2020 1.4* 1.7 - 7.7 K/uL Final  . Lymphocytes Relative 06/18/2020 34  % Final  . Lymphs Abs 06/18/2020 0.9  0.7 - 4.0 K/uL Final  . Monocytes Relative 06/18/2020 12  % Final  . Monocytes Absolute 06/18/2020 0.3  0 - 1 K/uL Final  . Eosinophils Relative 06/18/2020 2  % Final  . Eosinophils Absolute 06/18/2020 0.1  0 - 0 K/uL Final  . Basophils Relative 06/18/2020 0  % Final  . Basophils Absolute 06/18/2020 0.0  0 - 0 K/uL Final  . Immature Granulocytes 06/18/2020 0  % Final  . Abs Immature Granulocytes 06/18/2020 0.00  0.00 - 0.07 K/uL Final   Performed at [facility], [address] [place], [place] [address]  . Sodium 06/18/2020 139  135 - 145 mmol/L Final  . Potassium  06/18/2020 4.2  3.5 - 5.1 mmol/L Final  . Chloride 06/18/2020 109  98 -  111 mmol/L Final  . CO2 06/18/2020 21* 22 - 32 mmol/L Final  . Glucose, Bld 06/18/2020 102* 70 - 99 mg/dL Final   Glucose reference range applies only to samples taken after fasting for at least 8 hours.  . BUN 06/18/2020 16  8 - 23 mg/dL Final  . Creatinine, Ser 06/18/2020 1.45* 0.61 - 1.24 mg/dL Final  . Calcium 06/18/2020 8.8* 8.9 - 10.3 mg/dL Final  . Total Protein 06/18/2020 7.9  6.5 - 8.1 g/dL Final  . Albumin 06/18/2020 4.1  3.5 - 5.0 g/dL Final  . AST 06/18/2020 40  15 - 41 U/L Final  . ALT 06/18/2020 43  0 - 44 U/L Final  . Alkaline Phosphatase 06/18/2020 78  38 - 126 U/L Final  . Total Bilirubin 06/18/2020 0.5  0.3 - 1.2 mg/dL Final  . GFR calc non Af Amer 06/18/2020 50* >60 mL/min Final  . GFR calc Af Amer 06/18/2020 58* >60 mL/min Final  . Anion gap 06/18/2020 9  5 - 15 Final   Performed at [facility], [address], [place], [place] [address]  Admission on 06/02/2020, Discharged on 06/03/2020  Component Date Value Ref Range Status  . Color, Urine 06/03/2020 YELLOW  YELLOW Final  . APPearance 06/03/2020 CLEAR  CLEAR Final  . Specific Gravity, Urine 06/03/2020 >1.030* 1.005 - 1.030 Final  . pH 06/03/2020 6.0  5.0 - 8.0 Final  . Glucose, UA 06/03/2020 NEGATIVE  NEGATIVE mg/dL Final  . Hgb urine dipstick 06/03/2020 NEGATIVE  NEGATIVE Final  . Bilirubin Urine 06/03/2020 NEGATIVE  NEGATIVE Final  . Ketones, ur 06/03/2020 NEGATIVE  NEGATIVE mg/dL Final  . Protein, ur 06/03/2020 30* NEGATIVE mg/dL Final  . Nitrite 06/03/2020 NEGATIVE  NEGATIVE Final  . Chalmers Guest 06/03/2020 NEGATIVE  NEGATIVE Final   Performed at [facility], [address] [place], [place] [address]  . WBC 06/02/2020 2.7* 4.0 - 10.5 K/uL Final  . RBC 06/02/2020 3.46* 4.22 - 5.81 MIL/uL Final  . Hemoglobin 06/02/2020 11.6* 13.0 - 17.0 g/dL Final  . HCT 06/02/2020 35.1* 39 - 52 % Final  . MCV 06/02/2020 101.4*  80.0 - 100.0 fL Final  . MCH 06/02/2020 33.5  26.0 - 34.0 pg Final  . MCHC 06/02/2020 33.0  30.0 - 36.0 g/dL Final  . RDW 06/02/2020 15.4  11.5 - 15.5 % Final  . Platelets 06/02/2020 148* 150 - 400 K/uL Final  . nRBC 06/02/2020 0.0  0.0 - 0.2 % Final  . Neutrophils Relative % 06/02/2020 48  % Final  . Neutro Abs 06/02/2020 1.3* 1.7 - 7.7 K/uL Final  . Lymphocytes Relative 06/02/2020 30  % Final  . Lymphs Abs 06/02/2020 0.8  0.7 - 4.0 K/uL Final  . Monocytes Relative 06/02/2020 17  % Final  . Monocytes Absolute 06/02/2020 0.5  0 - 1 K/uL Final  . Eosinophils Relative 06/02/2020 3  % Final  . Eosinophils Absolute 06/02/2020 0.1  0 - 0 K/uL Final  . Basophils Relative 06/02/2020 1  % Final  . Basophils Absolute 06/02/2020 0.0  0 - 0 K/uL Final  . Immature Granulocytes 06/02/2020 1  % Final  . Abs Immature Granulocytes 06/02/2020 0.02  0.00 - 0.07 K/uL Final   Performed at [facility], [address] [place],  [address]  . Sodium 06/02/2020 144  135 - 145 mmol/L Final   LIPEMIC SPECIMEN  . Potassium 06/02/2020 3.4* 3.5 - 5.1 mmol/L Final   LIPEMIC  SPECIMEN  . Chloride 06/02/2020 109  98 - 111 mmol/L Final   LIPEMIC SPECIMEN  . CO2 06/02/2020 25  22 - 32 mmol/L Final   LIPEMIC SPECIMEN  . Glucose, Bld 06/02/2020 114* 70 - 99 mg/dL Final   Comment: Glucose reference range applies only to samples taken after fasting for at least 8 hours. LIPEMIC SPECIMEN   . BUN 06/02/2020 23  8 - 23 mg/dL Final  . Creatinine, Ser 06/02/2020 1.30* 0.61 - 1.24 mg/dL Final   LIPEMIC SPECIMEN  . Calcium 06/02/2020 8.6* 8.9 - 10.3 mg/dL Final   LIPEMIC SPECIMEN  . GFR calc non Af Amer 06/02/2020 57* >60 mL/min Final  . GFR calc Af Amer 06/02/2020 >60  >60 mL/min Final  . Anion gap 06/02/2020 10  5 - 15 Final   Performed at [facility], [address] [place], [place] [address]  . RBC / HPF 06/03/2020 6-10  0 - 5 RBC/hpf Final  . WBC, UA 06/03/2020 0-5  0 - 5 WBC/hpf  Final  . Bacteria, UA 06/03/2020 FEW* NONE SEEN Final  . Squamous Epithelial / LPF 06/03/2020 0-5  0 - 5 Final   Performed at [facility], [address] [place], [place] [address]    Allergies: Adhesive [tape], Cyclobenzaprine, Duloxetine hcl, and Morphine  PTA Medications: (Not in a hospital admission)   [place] home medications. Increase Buspar to 15 mg PO TID for anxiety and start propranolol 10 mg TID for anxiety.    Recommendations  Based on my evaluation the patient does not appear to have an emergency medical condition. Patient admitted to continuous observation.  Connye Burkitt, NP 09/02/20  4:56 PM

## 2020-09-02 NOTE — ED Notes (Signed)
Pt belongings given to transport service and knife retrieved from security. Pt anxious about transfer and given warm blanket.

## 2020-09-02 NOTE — ED Notes (Signed)
TTS complete  Called and reported that psych will reevaluate later today

## 2020-09-02 NOTE — ED Notes (Signed)
Report given to Mechele Claude RN at [facility] to transfer

## 2020-09-03 DIAGNOSIS — F332 Major depressive disorder, recurrent severe without psychotic features: Secondary | ICD-10-CM | POA: Diagnosis not present

## 2020-09-03 MED ORDER — PROPRANOLOL HCL 10 MG PO TABS
10.0000 mg | ORAL_TABLET | Freq: Three times a day (TID) | ORAL | 0 refills | Status: DC
Start: 2020-09-03 — End: 2020-10-03

## 2020-09-03 MED ORDER — BUSPIRONE HCL 15 MG PO TABS
15.0000 mg | ORAL_TABLET | Freq: Three times a day (TID) | ORAL | 0 refills | Status: DC
Start: 2020-09-03 — End: 2021-04-10

## 2020-09-03 NOTE — ED Notes (Signed)
Pt has a ankle bractlet on. staff going to charge  battery due to being low

## 2020-09-03 NOTE — ED Provider Notes (Signed)
FBC/OBS ASAP Discharge Summary  Date and Time: 09/03/2020 10:53 AM  Name: Brian Malone  MRN:  [id number]   Discharge Diagnoses:  Final diagnoses:  Severe episode of recurrent major depressive disorder, without psychotic features [facility])  Generalized anxiety disorder    Stay Summary: Per psychiatric evaluation 09/02/20: On evaluation Brian Crabbe Robertsreports he came to the hospital because "I kinda lost control. I got really anxious and was feeling anxious and Hopeless. My next door neighbor who is my friend moved and I my niece diad last week and both really bothered me. It was just a couple of things happened and I just fell apart and I'm not feeling much better today. I called Dr. Erling Cruz and he said he wanted to see me one day this week but did not have any openings so he told me to go to the hospital." Patient states that he lives alone and current doesn't have any one close by for support. States outpatient psychiatric services with Dr. Erling Cruz but "they don't have a therapist on staff." States that he has had therapy in the past and it help with coping skills "but I haven't seen nobody since I lost my last therapy. I have peer support that comes out to the house and they help me do things but don't really help with coping skills." When patient asked about his safety and the safety of others (suicidal/homicidal) patient stated "I don't want to do anything to hurt or kill myself." and patient also denied homicidal ideation; but when started discussing resources for patient he stated "I don't want to be a lone. I need to be in a safe place." Patient asked what he ment by safe place and if he needed safety from himself or someone else; patient responded "I don't like being by myself. If I leave I won't have someone around. I don't want to hurt myself but can't say it not an aspect." Patient would give no further explanation. Patient states that he has been involved in day programs before to  be around people but states he doesn't at this time and that it helped some but not much."  From admission H&P 09/02/2020: On admission to [facility], patient appears disheveled and reports anxious and depressed mood related to stressors identified above. He denies any SI, states "I just don't feel like living" but denies any thoughts of wanting to kill himself. He denies HI/AVH. He shows no signs of responding to internal stimuli. He feels anxiety is triggering his depression. He reports compliance with gabapentin 600 mg TID and Buspar 10 mg TID. He states he has had "bad reactions" to antidepressants in the past, such as hallucinations. He states propranolol has been helpful for his anxiety in the past. Patient did not receive any of his home medications in the ED today and has elevated blood pressure and heart rate at this time. I have reviewed home medications with patient and re-ordered home medications to be started now.  Brian Malone was admitted to [facility] for overnight observation based on evaluation above. On evaluation today, 09/03/20, Brian Malone reports improved mood, with improved sleep overnight as well. Buspar was increased to 15 mg TID, and propranolol 10 mg TID was started for anxiety. He is more future-oriented and continues to strongly deny any SI/HI/AVH and contracts for safety. He has been scheduled for outpatient appointment with his established psychiatrist Dr. Erling Cruz next week.    Total Time spent with patient: 20 minutes  Past Psychiatric History: History of  anxiety and depression Past Medical History:  Past Medical History:  Diagnosis Date  . Anxiety   . Diabetes mellitus without complication ([place])   . HIV (human immunodeficiency virus infection) ([place])   . Hypertension   . Renal disorder     Past Surgical History:  Procedure Laterality Date  . ANKLE ARTHROSCOPY    . APPENDECTOMY    . BACK SURGERY     FUSION  . PENILE PROSTHESIS  REMOVAL  05/2020   Removal due to abscess  .  PENILE PROSTHESIS PLACEMENT  04/2020  . THORACOTOMY Right 2008  ?  . TONSILLECTOMY     Family History:  Family History  Problem Relation Age of Onset  . CAD Mother   . Lung cancer Mother   . CAD Father   . Lung cancer Father   . CAD Brother    Family Psychiatric History: Denies Social History:  Social History   Substance and Sexual Activity  Alcohol Use Not Currently     Social History   Substance and Sexual Activity  Drug Use Never    Social History   Socioeconomic History  . Marital status: Divorced    Spouse name: Not on file  . Number of children: 2  . Years of education: Not on file  . Highest education level: Bachelor's degree (e.g., BA, AB, BS)  Occupational History  . Not on file  Tobacco Use  . Smoking status: Never Smoker  . Smokeless tobacco: Never Used  Vaping Use  . Vaping Use: Never used  Substance and Sexual Activity  . Alcohol use: Not Currently  . Drug use: Never  . Sexual activity: Not on file  Other Topics Concern  . Not on file  Social History Narrative   Pt is divorced   2 children   Right handed   Drinks soda, no coffee, no tea    Lives on the third floor of an apartment bldg. One story studio apartment   Social Determinants of Health   Financial Resource Strain:   . Difficulty of Paying Living Expenses: Not on file  Food Insecurity:   . Worried About Charity fundraiser in the Last Year: Not on file  . Ran Out of Food in the Last Year: Not on file  Transportation Needs:   . Lack of Transportation (Medical): Not on file  . Lack of Transportation (Non-Medical): Not on file  Physical Activity:   . Days of Exercise per Week: Not on file  . Minutes of Exercise per Session: Not on file  Stress:   . Feeling of Stress : Not on file  Social Connections:   . Frequency of Communication with Friends and Family: Not on file  . Frequency of Social Gatherings with Friends and Family: Not on file  . Attends Religious Services: Not on file   . Active Member of Clubs or Organizations: Not on file  . Attends Archivist Meetings: Not on file  . Marital Status: Not on file   SDOH:  SDOH Screenings   Alcohol Screen:   . Last Alcohol Screening Score (AUDIT): Not on file  Depression (PHQ2-9):   . PHQ-2 Score: Not on file  Financial Resource Strain:   . Difficulty of Paying Living Expenses: Not on file  Food Insecurity:   . Worried About Charity fundraiser in the Last Year: Not on file  . Ran Out of Food in the Last Year: Not on file  Housing:   . Last  Housing Risk Score: Not on file  Physical Activity:   . Days of Exercise per Week: Not on file  . Minutes of Exercise per Session: Not on file  Social Connections:   . Frequency of Communication with Friends and Family: Not on file  . Frequency of Social Gatherings with Friends and Family: Not on file  . Attends Religious Services: Not on file  . Active Member of Clubs or Organizations: Not on file  . Attends Archivist Meetings: Not on file  . Marital Status: Not on file  Stress:   . Feeling of Stress : Not on file  Tobacco Use: Low Risk   . Smoking Tobacco Use: Never Smoker  . Smokeless Tobacco Use: Never Used  Transportation Needs:   . Film/video editor (Medical): Not on file  . Lack of Transportation (Non-Medical): Not on file    Has this patient used any form of tobacco in the last 30 days? (Cigarettes, Smokeless Tobacco, Cigars, and/or Pipes) No  Current Medications:  Current Facility-Administered Medications  Medication Dose Route Frequency Provider Last Rate Last Admin  . abacavir-dolutegravir-lamiVUDine (TRIUMEQ) [id number] MG per tablet 1 tablet  1 tablet Oral QHS Connye Burkitt, NP   1 tablet at 09/02/20 2347  . acetaminophen (TYLENOL) tablet 650 mg  650 mg Oral Q6H PRN Connye Burkitt, NP      . alum & mag hydroxide-simeth (MAALOX/MYLANTA) 200-200-20 MG/5ML suspension 30 mL  30 mL Oral Q4H PRN Connye Burkitt, NP      . busPIRone  (BUSPAR) tablet 15 mg  15 mg Oral TID Connye Burkitt, NP   15 mg at 09/03/20 1044  . clonazePAM (KLONOPIN) tablet 0.5 mg  0.5 mg Oral TID Connye Burkitt, NP   0.5 mg at 09/03/20 1043  . ezetimibe (ZETIA) tablet 10 mg  10 mg Oral Daily Connye Burkitt, NP   10 mg at 09/03/20 1042  . gabapentin (NEURONTIN) capsule 600 mg  600 mg Oral TID Connye Burkitt, NP   600 mg at 09/03/20 1042  . hydrOXYzine (ATARAX/VISTARIL) tablet 25 mg  25 mg Oral TID PRN Connye Burkitt, NP   25 mg at 09/02/20 1950  . isosorbide mononitrate (IMDUR) 24 hr tablet 60 mg  60 mg Oral Daily Connye Burkitt, NP   60 mg at 09/03/20 1040  . loratadine (CLARITIN) tablet 10 mg  10 mg Oral QHS Connye Burkitt, NP   10 mg at 09/02/20 2206  . magnesium hydroxide (MILK OF MAGNESIA) suspension 30 mL  30 mL Oral Daily PRN Connye Burkitt, NP      . naproxen (NAPROSYN) tablet 250 mg  250 mg Oral BID PRN Connye Burkitt, NP      . pantoprazole (PROTONIX) EC tablet 40 mg  40 mg Oral Daily Connye Burkitt, NP   40 mg at 09/03/20 1040  . propranolol (INDERAL) tablet 10 mg  10 mg Oral TID Connye Burkitt, NP   10 mg at 09/03/20 1041  . tamsulosin (FLOMAX) capsule 0.4 mg  0.4 mg Oral QPC supper Connye Burkitt, NP      . tiZANidine (ZANAFLEX) tablet 2 mg  2 mg Oral QHS Connye Burkitt, NP   2 mg at 09/02/20 2206  . traZODone (DESYREL) tablet 50 mg  50 mg Oral QHS PRN Connye Burkitt, NP       Current Outpatient Medications  Medication Sig Dispense Refill  . acetaminophen (TYLENOL) 500  MG tablet Take 1,000 mg by mouth 3 (three) times daily.     . busPIRone (BUSPAR) 10 MG tablet Take 10 mg by mouth 3 (three) times daily.     . cetirizine (ZYRTEC) 10 MG tablet Take 10 mg by mouth daily.    . clindamycin (CLEOCIN) 300 MG capsule Take 1 capsule (300 mg total) by mouth 3 (three) times daily. (Patient not taking: Reported on 07/22/2020) 21 capsule 0  . clonazePAM (KLONOPIN) 0.5 MG tablet Take 0.5 mg by mouth 3 (three) times daily.    . colchicine 0.6 MG tablet  Take 0.6 mg by mouth daily as needed (as directed for gout flares).    . Cyanocobalamin (VITAMIN B-12) 5000 MCG TBDP Take 5,000 mcg by mouth daily.  (Patient not taking: Reported on 07/22/2020)    . Erenumab-aooe (AIMOVIG) 140 MG/ML SOAJ Inject 140 mg into the skin every 28 (twenty-eight) days. 1 pen 11  . ezetimibe (ZETIA) 10 MG tablet Take 10 mg by mouth daily.    Marland Kitchen gabapentin (NEURONTIN) 300 MG capsule Take 2 capsules (600 mg total) by mouth 3 (three) times daily.    Marland Kitchen HYDROcodone-acetaminophen (NORCO/VICODIN) 5-325 MG tablet Take 1 tablet by mouth every 6 (six) hours as needed. (Patient not taking: Reported on 07/22/2020) 6 tablet 0  . isosorbide mononitrate (IMDUR) 60 MG 24 hr tablet Take 1 tablet (60 mg total) by mouth daily. 90 tablet 3  . loperamide (IMODIUM A-D) 2 MG tablet Take 1 tablet (2 mg total) by mouth 4 (four) times daily as needed for diarrhea or loose stools. (Patient not taking: Reported on 07/22/2020) 30 tablet 0  . ondansetron (ZOFRAN ODT) 8 MG disintegrating tablet Take 1 tablet (8 mg total) by mouth every 8 (eight) hours as needed for nausea or vomiting. 20 tablet 5  . OZEMPIC, 0.25 OR 0.5 MG/DOSE, 2 MG/1.5ML SOPN Inject 0.5 mg into the skin once a week. Taking on Friday    . pantoprazole (PROTONIX) 40 MG tablet Take 40 mg by mouth daily before breakfast.     . rizatriptan (MAXALT) 10 MG tablet Take 10 mg by mouth as needed for migraine.     . SUMAtriptan (IMITREX) 6 MG/0.5ML SOLN injection 1 injection into skin.  May repeat in 1 hour if headache persists or recurs.  Maximum 2 injections in 24 hours 15 mL 5  . tamsulosin (FLOMAX) 0.4 MG CAPS capsule Take 0.4 mg by mouth at bedtime.     Marland Kitchen tiZANidine (ZANAFLEX) 4 MG tablet Take 4 mg by mouth at bedtime.     . topiramate (TOPAMAX) 50 MG tablet Take 50 mg by mouth 2 (two) times daily.     . TRIUMEQ 600-50-300 MG tablet Take 1 tablet by mouth daily.     . valACYclovir (VALTREX) 1000 MG tablet Take 1,000 mg by mouth 3 (three) times daily  as needed (as directed for trigeminal neuralgia flares). Trig neuralgia flare    . Vitamin D, Ergocalciferol, (DRISDOL) 1.25 MG (50000 UT) CAPS capsule Take 50,000 Units by mouth every Thursday.  (Patient not taking: Reported on 07/22/2020)      PTA Medications: (Not in a hospital admission)   Musculoskeletal  Strength & Muscle Tone: within normal limits Gait & Station: normal Patient leans: N/A  Psychiatric Specialty Exam  Presentation  General Appearance: Casual  Eye Contact:Good  Speech:Clear and Coherent;Normal Rate  Speech Volume:Normal  Handedness:No data recorded  Mood and Affect  Mood:Euthymic  Affect:Appropriate;Congruent   Thought Process  Thought Processes:Coherent;Goal Directed  Descriptions of Associations:Intact  Orientation:Full (Time, Place and Person)  Thought Content:Logical  Hallucinations:Hallucinations: None  Ideas of Reference:None  Suicidal Thoughts:Suicidal Thoughts: No  Homicidal Thoughts:Homicidal Thoughts: No   Sensorium  Memory:Immediate Fair;Recent Fair;Remote Fair  Judgment:Fair  Insight:Fair   Executive Functions  Concentration:Good  Attention Span:Good  [place]  Language:Good   Psychomotor Activity  Psychomotor Activity:Psychomotor Activity: Normal   Assets  Assets:Communication Skills;Desire for Improvement;Housing   Sleep  Sleep:Sleep: Good   Physical Exam  Physical Exam Vitals and nursing note reviewed.  Constitutional:      Appearance: He is well-developed.  Cardiovascular:     Rate and Rhythm: Normal rate.  Pulmonary:     Effort: Pulmonary effort is normal.  Neurological:     Mental Status: He is alert and oriented to person, place, and time.    Review of Systems  Constitutional: Negative.   Psychiatric/Behavioral: Positive for depression (chronic). Negative for hallucinations, memory loss, substance abuse and suicidal ideas. The patient is not nervous/anxious  and does not have insomnia.    Blood pressure (!) 154/100, pulse 75, temperature 98.1 F (36.7 C), temperature source Oral, resp. rate 18, height 6' (1.829 m), weight 74.8 kg, SpO2 100 %. Body mass index is 22.38 kg/m.  Demographic Factors:  Male, Age 50 or older and Living alone  Loss Factors: NA  Historical Factors: NA  Risk Reduction Factors:   Positive social support, Positive therapeutic relationship and Positive coping skills or problem solving skills  Continued Clinical Symptoms:  More than one psychiatric diagnosis  Cognitive Features That Contribute To Risk:  None    Suicide Risk:  Mild:  Suicidal ideation of limited frequency, intensity, duration, and specificity.  There are no identifiable plans, no associated intent, mild dysphoria and related symptoms, good self-control (both objective and subjective assessment), few other risk factors, and identifiable protective factors, including available and accessible social support.  Plan Of Care/Follow-up recommendations: Activity as tolerated. Diet as recommended by primary care physician. Keep all scheduled follow-up appointments as recommended.   Disposition: Patient shows no evidence of acute risk of harm to self or others and is psych cleared for discharge, to follow up with established outpatient provider.  Connye Burkitt, NP 09/03/2020, 10:53 AM

## 2020-09-03 NOTE — ED Notes (Signed)
Pt is sleeping @this  time. Battery was switched out on pt ankle braclet

## 2020-09-03 NOTE — ED Notes (Signed)
D: Pt alert and oriented on the unit.   A: Education, support, and encouragement provided. Discharge summary, medications and follow up appointments reviewed with pt. Suicide prevention resources provided. Pt's belongings in locker returned and belongings sheet signed.  R: Pt denies SI/HI, A/VH, pain, or any concerns at this time. Pt ambulatory on and off unit. Pt discharged to lobby to be transported home via [employer].

## 2020-09-03 NOTE — ED Notes (Signed)
Patient discharged.
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
## 2020-09-03 NOTE — Discharge Instructions (Addendum)
Activity as tolerated. Diet as recommended by primary care physician. Keep all scheduled follow-up appointments as recommended.

## 2020-09-03 NOTE — ED Notes (Signed)
Pt asleep with even and unlabored respirations. No distress or discomfort noted. Pt remains safe on the unit. Will continue to monitor. 

## 2020-09-18 NOTE — Progress Notes (Deleted)
{Choose 1 Note Type (Telehealth Visit or Telephone Visit):[phone]}   Date:  09/18/2020   ID:  Brian Malone, DOB 1951-01-21, MRN [id number]  {Patient Location:[phone]::"Home"} {Provider Location:[phone]::"Home Office"}  PCP:  [name], [name], MD  Cardiologist:  Elouise Munroe, MD  Electrophysiologist:  None   Evaluation Performed:  {Choose Visit IRWE:[id number]::"PPJKDT-OI Visit"}  Chief Complaint:    History of Present Illness:    Brian Malone is a 69 y.o. male with we are following for ongoing assessment and management of moderate CAD per recent CTA with indeterminate FFR values, hypertension, hyperlipidemia, with other history to include diabetes, HIV, and chronic kidney disease stage III.  The patient was also diagnosed with COVID-19 and underwent penile implant at [facility] in May 2021.  When last seen by Dr. Margaretann Loveless.  He was concerned about chronic MRSA infection and discomfort at the site of his reservoir.  Due to his complaints and immunocompromise status, the patient was referred to ED for further assessment and management.  The patient {does/does not:200015} have symptoms concerning for COVID-19 infection (fever, chills, cough, or new shortness of breath).    Past Medical History:  Diagnosis Date   Anxiety    Diabetes mellitus without complication ([place])    HIV (human immunodeficiency virus infection) ([place])    Hypertension    Renal disorder    Past Surgical History:  Procedure Laterality Date   ANKLE ARTHROSCOPY     APPENDECTOMY     BACK SURGERY     FUSION   PENILE PROSTHESIS  REMOVAL  05/2020   Removal due to abscess   PENILE PROSTHESIS PLACEMENT  04/2020   THORACOTOMY Right 2008  ?   TONSILLECTOMY       No outpatient medications have been marked as taking for the 09/19/20 encounter (Appointment) with Lendon Colonel, NP.     Allergies:   Adhesive [tape], Cyclobenzaprine, Duloxetine hcl, and Morphine    Social History   Tobacco Use   Smoking status: Never Smoker   Smokeless tobacco: Never Used  Vaping Use   Vaping Use: Never used  Substance Use Topics   Alcohol use: Not Currently   Drug use: Never     Family Hx: The patient's family history includes CAD in his brother, father, and mother; Lung cancer in his father and mother.  ROS:   Please see the history of present illness.    *** All other systems reviewed and are negative.   Prior CV studies:   The following studies were reviewed today:  ***  Labs/Other Tests and Data Reviewed:    EKG:  {EKG/Telemetry Strips [phone]}  Recent Labs: 09/01/2020: ALT 30; BUN 14; Creatinine, Ser 1.15; Hemoglobin 12.1; Platelets 204; Potassium 4.3; Sodium 139   Recent Lipid Panel Lab Results  Component Value Date/Time   CHOL 149 11/22/2019 09:13 PM   TRIG 133 02/12/2020 07:28 PM   HDL 25 (L) 11/22/2019 09:13 PM   CHOLHDL 6.0 11/22/2019 09:13 PM   LDLCALC 47 11/22/2019 09:13 PM    Wt Readings from Last 3 Encounters:  09/01/20 167 lb (75.8 kg)  07/22/20 164 lb (74.4 kg)  06/02/20 160 lb (72.6 kg)     Objective:    Vital Signs:  There were no vitals taken for this visit.   {HeartCare Virtual Exam (Optional):[phone]::"VITAL SIGNS:  reviewed"}  ASSESSMENT & PLAN:    1. ***  COVID-19 Education: The signs and symptoms of COVID-19 were discussed with the patient and how to seek care  for testing (follow up with PCP or arrange E-visit).  ***The importance of social distancing was discussed today.  Time:   Today, I have spent *** minutes with the patient with telehealth technology discussing the above problems.     Medication Adjustments/Labs and Tests Ordered: Current medicines are reviewed at length with the patient today.  Concerns regarding medicines are outlined above.   Tests Ordered: No orders of the defined types were placed in this encounter.   Medication Changes: No orders of the defined  types were placed in this encounter.   Disposition:  Follow up {follow up:15908}  Signed, Phill Myron. West Pugh, ANP, AACC  09/18/2020 4:42 PM    [facility]

## 2020-09-19 ENCOUNTER — Telehealth: Payer: Medicare Other | Admitting: Adult Health

## 2020-09-24 ENCOUNTER — Encounter: Payer: Self-pay | Admitting: Neurology

## 2020-09-24 NOTE — Progress Notes (Signed)
Brian Malone (Key: [id number]) Rx #: [id number] Aimovig 140MG /ML auto-injectors   Form [employer] Electronic PA Form Created 15 hours ago Sent to Plan 4 minutes ago Plan Response 4 minutes ago Submit Clinical Questions 1 minute ago Determination Favorable 1 minute ago Message from Plan PA Case: [id number], Status: Approved, Coverage Starts on: 09/24/2020 12:00:00 AM, Coverage Ends on: 12/23/2020 12:00:00 AM. Questions? Contact [phone].

## 2020-10-01 NOTE — Progress Notes (Signed)
Virtual Visit via Telephone Note   This visit type was conducted due to national recommendations for restrictions regarding the COVID-19 Pandemic (e.g. social distancing) in an effort to limit this patient's exposure and mitigate transmission in our community.  Due to his co-morbid illnesses, this patient is at least at moderate risk for complications without adequate follow up.  This format is felt to be most appropriate for this patient at this time.  The patient did not have access to video technology/had technical difficulties with video requiring transitioning to audio format only (telephone).  All issues noted in this document were discussed and addressed.  No physical exam could be performed with this format.  Please refer to the patient's chart for his  consent to telehealth for [facility].   Date:  10/03/2020   ID:  Brian Malone, DOB 20-Mar-1951, MRN [id number]  Patient Location: Home Provider Location: Home Office  PCP:  [name], [name], MD  Cardiologist:  Elouise Munroe, MD  Electrophysiologist:  None   Evaluation Performed:  Follow-Up Visit  Chief Complaint:  Follow Up  History of Present Illness:    Brian Malone is a 69 y.o. male we are following for ongoing assessment and management of abnormal CT coronary angiography demonstrating moderate CAD with indeterminate FFR values.  Patient also has a history of diabetes, hypertension, HIV, hyperlipidemia, and chronic kidney disease stage III.  No medications were changed at the last office visit  He was also diagnosed with COVID-19 and February 2021.He also underwent penile implant at [facility] in May. His main concerns are about persistent MRSA infection and discomfort at the site of the reservoir.  He was advised to present to ED for further evaluation as he was immunocompromised.  He was seen on 09/03/2020 in the ED for psychiatric evaluation.  The patient became very anxious and felt very hopeless.  He  denied any suicidal ideations.  But he was significantly depressed "I do not like being by myself.  If I leave I will have someone around.  I do not want to hurt myself but cannot say it is not an aspect."  Psychotropic medications were adjusted and he was to follow-up with psychiatry.  He jogs occasionally, intermittently, at least a mile. No chest pain with exercise. He is medically compliant. He says that the jogging helps his anxiety.   The patient does not have symptoms concerning for COVID-19 infection (fever, chills, cough, or new shortness of breath).    Past Medical History:  Diagnosis Date  . Anxiety   . Diabetes mellitus without complication ([place])   . HIV (human immunodeficiency virus infection) ([place])   . Hypertension   . Renal disorder    Past Surgical History:  Procedure Laterality Date  . ANKLE ARTHROSCOPY    . APPENDECTOMY    . BACK SURGERY     FUSION  . PENILE PROSTHESIS  REMOVAL  05/2020   Removal due to abscess  . PENILE PROSTHESIS PLACEMENT  04/2020  . THORACOTOMY Right 2008  ?  . TONSILLECTOMY       Current Meds  Medication Sig  . AIMOVIG 140 MG/ML SOAJ   . busPIRone (BUSPAR) 15 MG tablet Take 1 tablet (15 mg total) by mouth 3 (three) times daily.  . clonazePAM (KLONOPIN) 0.5 MG tablet Take 0.5 mg by mouth 3 (three) times daily.  Marland Kitchen ezetimibe (ZETIA) 10 MG tablet Take 10 mg by mouth daily.  Marland Kitchen gabapentin (NEURONTIN) 300 MG capsule Take 2  capsules (600 mg total) by mouth 3 (three) times daily.  . iron polysaccharides (NIFEREX) 150 MG capsule Take by mouth.  . loratadine (CLARITIN) 10 MG tablet Take by mouth.  . pantoprazole (PROTONIX) 40 MG tablet Take 40 mg by mouth daily before breakfast.   . propranolol ER (INDERAL LA) 60 MG 24 hr capsule Take by mouth.  . rizatriptan (MAXALT) 10 MG tablet Take by mouth.  . Semaglutide,0.25 or 0.5MG /DOS, 2 MG/1.5ML SOPN Inject into the skin.  . SUMAtriptan 6 MG/0.5ML SOAJ   . tamsulosin (FLOMAX) 0.4 MG CAPS capsule Take  0.4 mg by mouth at bedtime.   Marland Kitchen tiZANidine (ZANAFLEX) 4 MG tablet Take 4 mg by mouth at bedtime.   . traMADol (ULTRAM) 50 MG tablet Take by mouth.  . TRIUMEQ 600-50-300 MG tablet Take 1 tablet by mouth daily.   . valACYclovir (VALTREX) 1000 MG tablet Take 1,000 mg by mouth 3 (three) times daily as needed (as directed for trigeminal neuralgia flares). Trig neuralgia flare  . Vitamin D, Ergocalciferol, (DRISDOL) 1.25 MG (50000 UT) CAPS capsule Take 50,000 Units by mouth every Thursday.      Allergies:   Adhesive [tape], Cyclobenzaprine, Duloxetine hcl, and Morphine   Social History   Tobacco Use  . Smoking status: Never Smoker  . Smokeless tobacco: Never Used  Vaping Use  . Vaping Use: Never used  Substance Use Topics  . Alcohol use: Not Currently  . Drug use: Never     Family Hx: The patient's family history includes CAD in his brother, father, and mother; Lung cancer in his father and mother.  ROS:   Please see the history of present illness.    All other systems reviewed and are negative.   Prior CV studies:   The following studies were reviewed today: FINDINGS: FFRct analysis was performed on the original cardiac CT angiogram dataset. Diagrammatic representation of the FFRct analysis is provided in a separate PDF document in PACS. This dictation was created using the PDF document and an interactive 3D model of the results. 3D model is not available in the EMR/PACS. Normal FFR range is >0.80.  1. Left Main: 1.0.  2. LAD: Proximal: 0.97, mid: 0.96, distal: 0.76. 3. LCX: 0.92. 4. OM1: 0.84. 5. OM2: 0.62. 6. RCA: 0.93. 7. Acute marginal branch: 0.75.  IMPRESSION: 1. CT FFR analysis showed severe stenosis in the mid to distal LAD, ostial portion of OM2 and ostial portion of the acute marginal branch. These vessels are too small for intervention. Aggressive risk factor modification, anginal therapy and abstinence from drugs are recommended.   Labs/Other Tests  and Data Reviewed:    EKG:  No ECG reviewed.  Recent Labs: 09/01/2020: ALT 30; BUN 14; Creatinine, Ser 1.15; Hemoglobin 12.1; Platelets 204; Potassium 4.3; Sodium 139   Recent Lipid Panel Lab Results  Component Value Date/Time   CHOL 149 11/22/2019 09:13 PM   TRIG 133 02/12/2020 07:28 PM   HDL 25 (L) 11/22/2019 09:13 PM   CHOLHDL 6.0 11/22/2019 09:13 PM   LDLCALC 47 11/22/2019 09:13 PM    Wt Readings from Last 3 Encounters:  10/03/20 163 lb (73.9 kg)  09/01/20 167 lb (75.8 kg)  07/22/20 164 lb (74.4 kg)      Objective:    Vital Signs:  Ht 6' (1.829 m)   Wt 163 lb (73.9 kg)   BMI 22.11 kg/m    Limited due to telephone virtual visit.  VITAL SIGNS:  reviewed GEN:  no acute distress RESPIRATORY:  normal respiratory effort, symmetric expansion PSYCH:  normal affect  ASSESSMENT & PLAN:    1. CAD: Abnormal CTA wth indeterminate FFR values. He is completely asymptomatic for angina. He jogs intermittently to help with his anxiety and is without exertional chest pain. He is medically compliant. Continue nitrates. No changes in his regimen for now. He will follow up in 6 months with labs.  2.   Hypertension; BP not taken consistently at home. However, when last seen by        provider within the month, BP systolic ranged from 299 mmHg to 120 mmHg.   3.Tachycardia related to anxiety: During recent [place] admission, he had        HR up to 180 bpm during anxiety attack. Took propanolol as directed and HR                 normalized. He reports that the propanolol keeps his anxiety under control as well,        and he does not miss any doses.   4. Hyperlipidemia: Only on Zetia. Goal of LDL < 70. Most  recent labs 11/22/2019                  document LDL of 47, TC 149. TG 387. Follow up labs prior to next appointment.  COVID-19 Education: The signs and symptoms of COVID-19 were discussed with the patient and how to seek care for testing (follow up with PCP or arrange E-visit).   The importance of social distancing was discussed today.  Time:   Today, I have spent 20 minutes with the patient with telehealth technology discussing the above problems.     Medication Adjustments/Labs and Tests Ordered: Current medicines are reviewed at length with the patient today.  Concerns regarding medicines are outlined above.   Tests Ordered: No orders of the defined types were placed in this encounter.   Medication Changes: No orders of the defined types were placed in this encounter.   Disposition:  Follow up 6 months   Signed, Phill Myron. West Pugh, ANP, AACC  10/03/2020 11:10 AM    [facility]

## 2020-10-03 ENCOUNTER — Telehealth (INDEPENDENT_AMBULATORY_CARE_PROVIDER_SITE_OTHER): Payer: Medicare Other | Admitting: Adult Health

## 2020-10-03 VITALS — Ht 72.0 in | Wt 163.0 lb

## 2020-10-03 DIAGNOSIS — I25110 Atherosclerotic heart disease of native coronary artery with unstable angina pectoris: Secondary | ICD-10-CM

## 2020-10-03 DIAGNOSIS — I10 Essential (primary) hypertension: Secondary | ICD-10-CM

## 2020-10-03 DIAGNOSIS — E785 Hyperlipidemia, unspecified: Secondary | ICD-10-CM

## 2020-10-03 DIAGNOSIS — I2510 Atherosclerotic heart disease of native coronary artery without angina pectoris: Secondary | ICD-10-CM

## 2020-10-03 NOTE — Patient Instructions (Signed)
Medication Instructions:  No Changes *If you need a refill on your cardiac medications before your next appointment, please call your pharmacy*   Lab Work: CMP, Lipid ( Fasting 2 weeks prior to F/U appointment) If you have labs (blood work) drawn today and your tests are completely normal, you will receive your results only by:  [place] (if you have MyChart) OR  A paper copy in the mail If you have any lab test that is abnormal or we need to change your treatment, we will call you to review the results.   Testing/Procedures: None   Follow-Up: At [facility], you and your health needs are our priority.  As part of our continuing mission to provide you with exceptional heart care, we have created designated Provider Care Teams.  These Care Teams include your primary Cardiologist (physician) and Advanced Practice Providers (APPs -  Physician Assistants and Nurse Practitioners) who all work together to provide you with the care you need, when you need it.  Your next appointment:   6 month(s)  The format for your next appointment:   In Person  Provider:   Cherlynn Kaiser, MD

## 2020-10-08 ENCOUNTER — Other Ambulatory Visit: Payer: Self-pay

## 2020-10-08 ENCOUNTER — Encounter (HOSPITAL_BASED_OUTPATIENT_CLINIC_OR_DEPARTMENT_OTHER): Payer: Self-pay | Admitting: *Deleted

## 2020-10-08 ENCOUNTER — Emergency Department (HOSPITAL_BASED_OUTPATIENT_CLINIC_OR_DEPARTMENT_OTHER)
Admission: EM | Admit: 2020-10-08 | Discharge: 2020-10-08 | Disposition: A | Discharge status: Home / Self Care | Payer: Medicare Other | Attending: Emergency Medicine | Admitting: Emergency Medicine

## 2020-10-08 DIAGNOSIS — F419 Anxiety disorder, unspecified: Secondary | ICD-10-CM | POA: Insufficient documentation

## 2020-10-08 DIAGNOSIS — E1140 Type 2 diabetes mellitus with diabetic neuropathy, unspecified: Secondary | ICD-10-CM | POA: Diagnosis not present

## 2020-10-08 DIAGNOSIS — Z79899 Other long term (current) drug therapy: Secondary | ICD-10-CM | POA: Insufficient documentation

## 2020-10-08 DIAGNOSIS — I129 Hypertensive chronic kidney disease with stage 1 through stage 4 chronic kidney disease, or unspecified chronic kidney disease: Secondary | ICD-10-CM | POA: Diagnosis not present

## 2020-10-08 DIAGNOSIS — N183 Chronic kidney disease, stage 3 unspecified: Secondary | ICD-10-CM | POA: Diagnosis not present

## 2020-10-08 DIAGNOSIS — E1165 Type 2 diabetes mellitus with hyperglycemia: Secondary | ICD-10-CM | POA: Diagnosis not present

## 2020-10-08 DIAGNOSIS — I2510 Atherosclerotic heart disease of native coronary artery without angina pectoris: Secondary | ICD-10-CM | POA: Diagnosis not present

## 2020-10-08 DIAGNOSIS — Z8616 Personal history of COVID-19: Secondary | ICD-10-CM | POA: Insufficient documentation

## 2020-10-08 LAB — RAPID URINE DRUG SCREEN, HOSP PERFORMED
Amphetamines: POSITIVE — AB
Barbiturates: NOT DETECTED
Benzodiazepines: NOT DETECTED
Cocaine: NOT DETECTED
Opiates: NOT DETECTED
Tetrahydrocannabinol: NOT DETECTED

## 2020-10-08 NOTE — Discharge Instructions (Signed)
You may check a urine drug screen results on MyChart.

## 2020-10-08 NOTE — ED Triage Notes (Signed)
Pt reports anxious after failing drug test by patrol officer.

## 2020-10-08 NOTE — ED Provider Notes (Signed)
[name] EMERGENCY DEPARTMENT Provider Note   CSN: [id number] Arrival date & time: 10/08/20  1616     History Chief Complaint  Patient presents with  . Anxiety    Brian Malone is a 69 y.o. male.  HPI 69 year old male with history of anxiety, DM type II, HIV, hypertension presents to the ER with complaints of anxiety and requesting a UDS after failing a UDS with his parole officer.  Patient states he tested positive for marijuana with his parole officer earlier today, but adamantly denies use.  States he has been having lots of anxiety as his will future rides on this urine drug screen.  He is demanding testing or "proof" that he does not use marijuana.  He states he called his PCPs office and he was directed here.  He denies any SI/HI, no visual or auditory hallucinations.    Past Medical History:  Diagnosis Date  . Anxiety   . Diabetes mellitus without complication ([place])   . HIV (human immunodeficiency virus infection) ([place])   . Hypertension   . Renal disorder     Patient Active Problem List   Diagnosis Date Noted  . Hypotension 05/26/2020  . CRI (chronic renal insufficiency), stage 3 (moderate) ([place]) 05/26/2020  . CAD (coronary artery disease) 05/26/2020  . History of COVID-19 05/26/2020  . Dizziness 12/26/2019  . Chest pain 11/22/2019  . Acute subdural hematoma ([place]) 10/28/2019  . Subdural hematoma ([place]) 10/26/2019  . Dysphagia 06/20/2019  . AKI (acute kidney injury) ([place]) 06/20/2019  . HCAP (healthcare-associated pneumonia) 06/20/2019  . Normal anion gap metabolic acidosis 40/98/1191  . ARF (acute renal failure) ([place]) 06/19/2019  . Cervical spinal stenosis 05/18/2019  . Lumbar spondylosis 05/18/2019  . Sacroiliitis ([place]) 05/15/2019  . Right foot ulcer ([place]) 05/13/2019  . HIV (human immunodeficiency virus infection) ([place]) 05/13/2019  . Tachycardia 04/16/2019  . Diabetes ([place]) 04/16/2019  . Cellulitis 04/16/2019  . Wound infection after surgery  04/15/2019  . Benign prostatic hyperplasia (BPH) with straining on urination 02/22/2019  . Essential hypertension 02/22/2019  . Post-traumatic osteoarthritis of both ankles 02/22/2019  . Trigeminal neuralgia 02/22/2019  . Uncontrolled type 2 diabetes mellitus with hyperglycemia ([place]) 02/22/2019  . Chronic bilateral low back pain with bilateral sciatica 01/19/2019  . Diabetic autonomic neuropathy associated with type 2 diabetes mellitus ([name]) 01/19/2019  . Migraine with aura and without status migrainosus, not intractable 01/19/2019  . Neck pain 01/19/2019  . Neuropathy of both feet 01/19/2019  . Nocturia more than twice per night 01/07/2015  . Anxiety 10/23/2014  . Candida, oral 10/23/2014  . Depression 10/23/2014  . Insomnia 10/23/2014  . Rash of entire body 10/23/2014    Past Surgical History:  Procedure Laterality Date  . ANKLE ARTHROSCOPY    . APPENDECTOMY    . BACK SURGERY     FUSION  . PENILE PROSTHESIS  REMOVAL  05/2020   Removal due to abscess  . PENILE PROSTHESIS PLACEMENT  04/2020  . THORACOTOMY Right 2008  ?  . TONSILLECTOMY         Family History  Problem Relation Age of Onset  . CAD Mother   . Lung cancer Mother   . CAD Father   . Lung cancer Father   . CAD Brother     Social History   Tobacco Use  . Smoking status: Never Smoker  . Smokeless tobacco: Never Used  Vaping Use  . Vaping Use: Never used  Substance Use Topics  . Alcohol use:  Not Currently  . Drug use: Never    Home Medications Prior to Admission medications   Medication Sig Start Date End Date Taking? Authorizing Provider  AIMOVIG 140 MG/ML SOAJ  09/24/20   [provider]  busPIRone (BUSPAR) 15 MG tablet Take 1 tablet (15 mg total) by mouth 3 (three) times daily. 09/03/20   Connye Burkitt, NP  clonazePAM (KLONOPIN) 0.5 MG tablet Take 0.5 mg by mouth 3 (three) times daily. 10/01/19   [provider]  ezetimibe (ZETIA) 10 MG tablet Take 10 mg by mouth daily.     [provider]  gabapentin (NEURONTIN) 300 MG capsule Take 2 capsules (600 mg total) by mouth 3 (three) times daily. 11/24/19   Geradine Girt, DO  iron polysaccharides (NIFEREX) 150 MG capsule Take by mouth. 08/29/20   [provider]  isosorbide mononitrate (IMDUR) 60 MG 24 hr tablet Take 1 tablet (60 mg total) by mouth daily. 11/29/19 07/22/20  Elouise Munroe, MD  loratadine (CLARITIN) 10 MG tablet Take by mouth.    [provider]  pantoprazole (PROTONIX) 40 MG tablet Take 40 mg by mouth daily before breakfast.  02/12/19   [provider]  propranolol ER (INDERAL LA) 60 MG 24 hr capsule Take by mouth. 08/28/20   [provider]  rizatriptan (MAXALT) 10 MG tablet Take by mouth. 03/21/19   [provider]  Semaglutide,0.25 or 0.5MG /DOS, 2 MG/1.5ML SOPN Inject into the skin. 10/08/19   [provider]  SUMAtriptan 6 MG/0.5ML SOAJ  09/24/20   [provider]  tamsulosin (FLOMAX) 0.4 MG CAPS capsule Take 0.4 mg by mouth at bedtime.  02/12/19   [provider]  tiZANidine (ZANAFLEX) 4 MG tablet Take 4 mg by mouth at bedtime.  03/08/19   [provider]  traMADol Veatrice Bourbon) 50 MG tablet Take by mouth. 09/28/20 10/28/20  [provider]  TRIUMEQ 600-50-300 MG tablet Take 1 tablet by mouth daily.  02/12/19   [provider]  valACYclovir (VALTREX) 1000 MG tablet Take 1,000 mg by mouth 3 (three) times daily as needed (as directed for trigeminal neuralgia flares). Trig neuralgia flare 10/16/19   [provider]  Vitamin D, Ergocalciferol, (DRISDOL) 1.25 MG (50000 UT) CAPS capsule Take 50,000 Units by mouth every Thursday.  03/21/19   [provider]    Allergies    Adhesive [tape], Cyclobenzaprine, Duloxetine hcl, and Morphine  Review of Systems   Review of Systems  Psychiatric/Behavioral: Negative for agitation, confusion, dysphoric mood, hallucinations, self-injury and suicidal ideas.  The patient is nervous/anxious.     Physical Exam Updated Vital Signs BP (!) 168/93   Pulse 92   Temp 98.7 F (37.1 C) (Oral)   Resp 18   Ht 6' (1.829 m)   Wt 74.8 kg   SpO2 99%   BMI 22.38 kg/m   Physical Exam Vitals reviewed.  Constitutional:      Appearance: Normal appearance.  HENT:     Head: Normocephalic and atraumatic.  Eyes:     General:        Right eye: No discharge.        Left eye: No discharge.     Extraocular Movements: Extraocular movements intact.     Conjunctiva/sclera: Conjunctivae normal.  Musculoskeletal:        General: No swelling. Normal range of motion.  Neurological:     General: No focal deficit present.     Mental Status: He is alert and oriented to person,  place, and time.  Psychiatric:        Mood and Affect: Mood normal.        Behavior: Behavior normal.     Comments: Anxious appearing     ED Results / Procedures / Treatments   Labs (all labs ordered are listed, but only abnormal results are displayed) Labs Reviewed  RAPID URINE DRUG SCREEN, HOSP PERFORMED    EKG None  Radiology No results found.  Procedures Procedures (including critical care time)  Medications Ordered in ED Medications - No data to display  ED Course  I have reviewed the triage vital signs and the nursing notes.  Pertinent labs & imaging results that were available during my care of the patient were reviewed by me and considered in my medical decision making (see chart for details).    MDM Rules/Calculators/A&P                          Patient presents requesting UDS and proof that he does not use marijuana.  I explained to the patient that we do not regularly do this here in the ER, however he remained adamant that he needs a UDS.  He denies any SI/HI, do not think he needs further psychiatric evaluation.  UDS ordered, patient instructed to follow-up via MyChart.  Able for discharge at this time. Final Clinical Impression(s) / ED Diagnoses Final  diagnoses:  Anxiety    Rx / DC Orders ED Discharge Orders    None       Garald Balding, PA-C 10/08/20 1715    Maudie Flakes, MD 10/08/20 2247

## 2020-10-10 ENCOUNTER — Other Ambulatory Visit: Payer: Self-pay

## 2020-10-10 ENCOUNTER — Encounter (HOSPITAL_COMMUNITY): Payer: Self-pay | Admitting: *Deleted

## 2020-10-10 ENCOUNTER — Emergency Department (HOSPITAL_COMMUNITY)
Admission: EM | Admit: 2020-10-10 | Discharge: 2020-10-10 | Disposition: A | Discharge status: Home / Self Care | Payer: Medicare Other | Attending: Emergency Medicine | Admitting: Emergency Medicine

## 2020-10-10 DIAGNOSIS — Z7984 Long term (current) use of oral hypoglycemic drugs: Secondary | ICD-10-CM | POA: Diagnosis not present

## 2020-10-10 DIAGNOSIS — I2510 Atherosclerotic heart disease of native coronary artery without angina pectoris: Secondary | ICD-10-CM | POA: Diagnosis not present

## 2020-10-10 DIAGNOSIS — E1165 Type 2 diabetes mellitus with hyperglycemia: Secondary | ICD-10-CM | POA: Diagnosis not present

## 2020-10-10 DIAGNOSIS — N183 Chronic kidney disease, stage 3 unspecified: Secondary | ICD-10-CM | POA: Insufficient documentation

## 2020-10-10 DIAGNOSIS — Z79899 Other long term (current) drug therapy: Secondary | ICD-10-CM | POA: Diagnosis not present

## 2020-10-10 DIAGNOSIS — I129 Hypertensive chronic kidney disease with stage 1 through stage 4 chronic kidney disease, or unspecified chronic kidney disease: Secondary | ICD-10-CM | POA: Diagnosis not present

## 2020-10-10 DIAGNOSIS — E1143 Type 2 diabetes mellitus with diabetic autonomic (poly)neuropathy: Secondary | ICD-10-CM | POA: Insufficient documentation

## 2020-10-10 DIAGNOSIS — Z8616 Personal history of COVID-19: Secondary | ICD-10-CM | POA: Diagnosis not present

## 2020-10-10 DIAGNOSIS — F419 Anxiety disorder, unspecified: Secondary | ICD-10-CM | POA: Diagnosis not present

## 2020-10-10 NOTE — ED Triage Notes (Signed)
BIB EMS seen 2 days ago for same. Anxiety due to failing a drug test by officer. Not eating or sleeping.

## 2020-10-10 NOTE — Discharge Instructions (Addendum)
Increase your Klonopin to 1 mg 3 times a day.  Keep your appointment with your psychiatrist next week

## 2020-10-10 NOTE — Progress Notes (Signed)
CSW received a call from pt's RN stating pt is stating he can't take a bus.    CSW met with pt who stated he has taken the bus numerous times ad but that crowds on busses are a stressor for the the pt.  CSW stated pt is ambulatory and that a bus is his only option and pt voiced understanding accepted a bus pass and accepted CSW's offer to seek a security escort for safety to the bus stop across the street from the hospital.  Security stated they would walk the pt over and did so and then called the CSW to confirm this was done and pt was at the bus stop and was safe.  Please reconsult if future social work needs arise.  CSW signing off, as social work intervention is no longer needed.  Alphonse Guild. Nelia Rogoff  MSW, LCSW, LCAS, CCS Transitions of Care Clinical Social Worker Care Coordination Department Ph: [phone]

## 2020-10-10 NOTE — ED Provider Notes (Signed)
[facility] DEPT Provider Note   CSN: [id number] Arrival date & time: 10/10/20  1752     History Chief Complaint  Patient presents with  . Anxiety    Brian Malone is a 69 y.o. male.  69 year old male who presents with increased anxiety.  Patient takes Klonopin 0.5 mg 3 times daily.  Has had increased stress in his life recently.  Denies any SI or HI.  No other complaints of at this time.  Seen here recently for same.  Patient symptoms do improve after he takes Klonopin.  Denies any illicit drug use        Past Medical History:  Diagnosis Date  . Anxiety   . Diabetes mellitus without complication ([place])   . HIV (human immunodeficiency virus infection) ([place])   . Hypertension   . Renal disorder     Patient Active Problem List   Diagnosis Date Noted  . Hypotension 05/26/2020  . CRI (chronic renal insufficiency), stage 3 (moderate) ([place]) 05/26/2020  . CAD (coronary artery disease) 05/26/2020  . History of COVID-19 05/26/2020  . Dizziness 12/26/2019  . Chest pain 11/22/2019  . Acute subdural hematoma ([place]) 10/28/2019  . Subdural hematoma ([place]) 10/26/2019  . Dysphagia 06/20/2019  . AKI (acute kidney injury) ([place]) 06/20/2019  . HCAP (healthcare-associated pneumonia) 06/20/2019  . Normal anion gap metabolic acidosis 02/58/5277  . ARF (acute renal failure) ([place]) 06/19/2019  . Cervical spinal stenosis 05/18/2019  . Lumbar spondylosis 05/18/2019  . Sacroiliitis ([place]) 05/15/2019  . Right foot ulcer ([place]) 05/13/2019  . HIV (human immunodeficiency virus infection) ([place]) 05/13/2019  . Tachycardia 04/16/2019  . Diabetes ([place]) 04/16/2019  . Cellulitis 04/16/2019  . Wound infection after surgery 04/15/2019  . Benign prostatic hyperplasia (BPH) with straining on urination 02/22/2019  . Essential hypertension 02/22/2019  . Post-traumatic osteoarthritis of both ankles 02/22/2019  . Trigeminal neuralgia 02/22/2019  . Uncontrolled type 2 diabetes  mellitus with hyperglycemia ([place]) 02/22/2019  . Chronic bilateral low back pain with bilateral sciatica 01/19/2019  . Diabetic autonomic neuropathy associated with type 2 diabetes mellitus ([place]) 01/19/2019  . Migraine with aura and without status migrainosus, not intractable 01/19/2019  . Neck pain 01/19/2019  . Neuropathy of both feet 01/19/2019  . Nocturia more than twice per night 01/07/2015  . Anxiety 10/23/2014  . Candida, oral 10/23/2014  . Depression 10/23/2014  . Insomnia 10/23/2014  . Rash of entire body 10/23/2014    Past Surgical History:  Procedure Laterality Date  . ANKLE ARTHROSCOPY    . APPENDECTOMY    . BACK SURGERY     FUSION  . PENILE PROSTHESIS  REMOVAL  05/2020   Removal due to abscess  . PENILE PROSTHESIS PLACEMENT  04/2020  . THORACOTOMY Right 2008  ?  . TONSILLECTOMY         Family History  Problem Relation Age of Onset  . CAD Mother   . Lung cancer Mother   . CAD Father   . Lung cancer Father   . CAD Brother     Social History   Tobacco Use  . Smoking status: Never Smoker  . Smokeless tobacco: Never Used  Vaping Use  . Vaping Use: Never used  Substance Use Topics  . Alcohol use: Not Currently  . Drug use: Never    Home Medications Prior to Admission medications   Medication Sig Start Date End Date Taking? Authorizing Provider  AIMOVIG 140 MG/ML SOAJ  09/24/20   [provider]  busPIRone (  BUSPAR) 15 MG tablet Take 1 tablet (15 mg total) by mouth 3 (three) times daily. 09/03/20   Connye Burkitt, NP  clonazePAM (KLONOPIN) 0.5 MG tablet Take 0.5 mg by mouth 3 (three) times daily. 10/01/19   [provider]  ezetimibe (ZETIA) 10 MG tablet Take 10 mg by mouth daily.    [provider]  gabapentin (NEURONTIN) 300 MG capsule Take 2 capsules (600 mg total) by mouth 3 (three) times daily. 11/24/19   Geradine Girt, DO  iron polysaccharides (NIFEREX) 150 MG capsule Take by mouth. 08/29/20   [provider]    isosorbide mononitrate (IMDUR) 60 MG 24 hr tablet Take 1 tablet (60 mg total) by mouth daily. 11/29/19 07/22/20  Elouise Munroe, MD  loratadine (CLARITIN) 10 MG tablet Take by mouth.    [provider]  pantoprazole (PROTONIX) 40 MG tablet Take 40 mg by mouth daily before breakfast.  02/12/19   [provider]  propranolol ER (INDERAL LA) 60 MG 24 hr capsule Take by mouth. 08/28/20   [provider]  rizatriptan (MAXALT) 10 MG tablet Take by mouth. 03/21/19   [provider]  Semaglutide,0.25 or 0.5MG /DOS, 2 MG/1.5ML SOPN Inject into the skin. 10/08/19   [provider]  SUMAtriptan 6 MG/0.5ML SOAJ  09/24/20   [provider]  tamsulosin (FLOMAX) 0.4 MG CAPS capsule Take 0.4 mg by mouth at bedtime.  02/12/19   [provider]  tiZANidine (ZANAFLEX) 4 MG tablet Take 4 mg by mouth at bedtime.  03/08/19   [provider]  traMADol Veatrice Bourbon) 50 MG tablet Take by mouth. 09/28/20 10/28/20  [provider]  TRIUMEQ 600-50-300 MG tablet Take 1 tablet by mouth daily.  02/12/19   [provider]  valACYclovir (VALTREX) 1000 MG tablet Take 1,000 mg by mouth 3 (three) times daily as needed (as directed for trigeminal neuralgia flares). Trig neuralgia flare 10/16/19   [provider]  Vitamin D, Ergocalciferol, (DRISDOL) 1.25 MG (50000 UT) CAPS capsule Take 50,000 Units by mouth every Thursday.  03/21/19   [provider]    Allergies    Adhesive [tape], Cyclobenzaprine, Duloxetine hcl, and Morphine  Review of Systems   Review of Systems  All other systems reviewed and are negative.   Physical Exam Updated Vital Signs BP 110/68 (BP Location: Left Arm)   Pulse 65   Temp 98.2 F (36.8 C) (Oral)   Resp 16   Wt 74.8 kg   SpO2 97%   BMI 22.38 kg/m   Physical Exam Vitals and nursing note reviewed.  Constitutional:      General: He is not in acute distress.    Appearance: Normal appearance. He is  well-developed. He is not toxic-appearing.  HENT:     Head: Normocephalic and atraumatic.  Eyes:     General: Lids are normal.     Conjunctiva/sclera: Conjunctivae normal.     Pupils: Pupils are equal, round, and reactive to light.  Neck:     Thyroid: No thyroid mass.     Trachea: No tracheal deviation.  Cardiovascular:     Rate and Rhythm: Normal rate and regular rhythm.     Heart sounds: Normal heart sounds. No murmur heard.  No gallop.   Pulmonary:     Effort: Pulmonary effort is normal. No respiratory distress.     Breath sounds: Normal breath sounds. No stridor. No decreased breath sounds, wheezing, rhonchi or rales.  Abdominal:     General: Bowel  sounds are normal. There is no distension.     Palpations: Abdomen is soft.     Tenderness: There is no abdominal tenderness. There is no rebound.  Musculoskeletal:        General: No tenderness. Normal range of motion.     Cervical back: Normal range of motion and neck supple.  Skin:    General: Skin is warm and dry.     Findings: No abrasion or rash.  Neurological:     Mental Status: He is alert and oriented to person, place, and time.     GCS: GCS eye subscore is 4. GCS verbal subscore is 5. GCS motor subscore is 6.     Cranial Nerves: No cranial nerve deficit.     Sensory: No sensory deficit.  Psychiatric:        Attention and Perception: Attention normal.        Mood and Affect: Mood is anxious.        Speech: Speech normal.        Behavior: Behavior normal.     ED Results / Procedures / Treatments   Labs (all labs ordered are listed, but only abnormal results are displayed) Labs Reviewed - No data to display  EKG None  Radiology No results found.  Procedures Procedures (including critical care time)  Medications Ordered in ED Medications - No data to display  ED Course  I have reviewed the triage vital signs and the nursing notes.  Pertinent labs & imaging results that were available during my care of  the patient were reviewed by me and considered in my medical decision making (see chart for details).    MDM Rules/Calculators/A&P                          Patient instructed to increase his Klonopin to 0.5 mg 3 times a day.  Patient has an appointment to see a psychiatrist next week.  He also takes BuSpar as well 2.  Patient comfortable with this plan. Final Clinical Impression(s) / ED Diagnoses Final diagnoses:  None    Rx / DC Orders ED Discharge Orders    None       Lacretia Leigh, MD 10/10/20 1919

## 2020-10-22 ENCOUNTER — Emergency Department (HOSPITAL_COMMUNITY)
Admission: EM | Admit: 2020-10-22 | Discharge: 2020-10-22 | Disposition: A | Discharge status: Home / Self Care | Payer: Medicare Other | Attending: Emergency Medicine | Admitting: Emergency Medicine

## 2020-10-22 ENCOUNTER — Encounter (HOSPITAL_COMMUNITY): Payer: Self-pay | Admitting: Emergency Medicine

## 2020-10-22 ENCOUNTER — Other Ambulatory Visit: Payer: Self-pay

## 2020-10-22 DIAGNOSIS — R200 Anesthesia of skin: Secondary | ICD-10-CM | POA: Insufficient documentation

## 2020-10-22 DIAGNOSIS — R531 Weakness: Secondary | ICD-10-CM | POA: Diagnosis not present

## 2020-10-22 DIAGNOSIS — Z5321 Procedure and treatment not carried out due to patient leaving prior to being seen by health care provider: Secondary | ICD-10-CM | POA: Diagnosis not present

## 2020-10-22 LAB — CBC
HCT: 40.7 % (ref 39.0–52.0)
Hemoglobin: 12.5 g/dL — ABNORMAL LOW (ref 13.0–17.0)
MCH: 32.2 pg (ref 26.0–34.0)
MCHC: 30.7 g/dL (ref 30.0–36.0)
MCV: 104.9 fL — ABNORMAL HIGH (ref 80.0–100.0)
Platelets: 184 10*3/uL (ref 150–400)
RBC: 3.88 MIL/uL — ABNORMAL LOW (ref 4.22–5.81)
RDW: 13.8 % (ref 11.5–15.5)
WBC: 5.8 10*3/uL (ref 4.0–10.5)
nRBC: 0 % (ref 0.0–0.2)

## 2020-10-22 LAB — BASIC METABOLIC PANEL
Anion gap: 8 (ref 5–15)
BUN: 17 mg/dL (ref 8–23)
CO2: 25 mmol/L (ref 22–32)
Calcium: 9.1 mg/dL (ref 8.9–10.3)
Chloride: 103 mmol/L (ref 98–111)
Creatinine, Ser: 1.23 mg/dL (ref 0.61–1.24)
GFR, Estimated: >60 mL/min (ref >60)
Glucose, Bld: 172 mg/dL — ABNORMAL HIGH (ref 70–99)
Potassium: 4.6 mmol/L (ref 3.5–5.1)
Sodium: 136 mmol/L (ref 135–145)

## 2020-10-22 NOTE — ED Notes (Signed)
Pt wants to leave. Complains of the wait and anxiety. Offered recliner and accommodations. The pt refuses. Triage RN [facility] notified. IV removed and pt seen leaving lobby.

## 2020-10-22 NOTE — ED Triage Notes (Signed)
Patient arrives to ED with complaints of numbness and weakness to right hand/wrist. Patient woke up with this, LKN at 0430. Pt able to move R arm up and down at elbow and shoulder. Forearm to wrist/hand is numb to the right. No other weaknesses noted. Denies vision and speech abnormalities. No neglect. VSS. Hx HIV.

## 2020-11-03 ENCOUNTER — Inpatient Hospital Stay (HOSPITAL_BASED_OUTPATIENT_CLINIC_OR_DEPARTMENT_OTHER)
Admission: EM | Admit: 2020-11-03 | Discharge: 2020-11-05 | DRG: 603 | Disposition: A | Discharge status: Home Health | Payer: Medicare Other | Attending: Family Medicine | Admitting: Family Medicine

## 2020-11-03 ENCOUNTER — Encounter (HOSPITAL_BASED_OUTPATIENT_CLINIC_OR_DEPARTMENT_OTHER): Payer: Self-pay

## 2020-11-03 ENCOUNTER — Emergency Department (HOSPITAL_BASED_OUTPATIENT_CLINIC_OR_DEPARTMENT_OTHER): Payer: Medicare Other

## 2020-11-03 ENCOUNTER — Emergency Department (HOSPITAL_COMMUNITY): Payer: Medicare Other

## 2020-11-03 ENCOUNTER — Other Ambulatory Visit: Payer: Self-pay

## 2020-11-03 DIAGNOSIS — Z801 Family history of malignant neoplasm of trachea, bronchus and lung: Secondary | ICD-10-CM

## 2020-11-03 DIAGNOSIS — Z6822 Body mass index (BMI) 22.0-22.9, adult: Secondary | ICD-10-CM | POA: Diagnosis not present

## 2020-11-03 DIAGNOSIS — D696 Thrombocytopenia, unspecified: Secondary | ICD-10-CM | POA: Diagnosis not present

## 2020-11-03 DIAGNOSIS — L02413 Cutaneous abscess of right upper limb: Secondary | ICD-10-CM | POA: Diagnosis present

## 2020-11-03 DIAGNOSIS — Z79899 Other long term (current) drug therapy: Secondary | ICD-10-CM

## 2020-11-03 DIAGNOSIS — D649 Anemia, unspecified: Secondary | ICD-10-CM | POA: Diagnosis not present

## 2020-11-03 DIAGNOSIS — E440 Moderate protein-calorie malnutrition: Secondary | ICD-10-CM | POA: Diagnosis not present

## 2020-11-03 DIAGNOSIS — L03113 Cellulitis of right upper limb: Secondary | ICD-10-CM | POA: Diagnosis not present

## 2020-11-03 DIAGNOSIS — Z91048 Other nonmedicinal substance allergy status: Secondary | ICD-10-CM

## 2020-11-03 DIAGNOSIS — E1165 Type 2 diabetes mellitus with hyperglycemia: Secondary | ICD-10-CM | POA: Diagnosis not present

## 2020-11-03 DIAGNOSIS — G629 Polyneuropathy, unspecified: Secondary | ICD-10-CM | POA: Diagnosis present

## 2020-11-03 DIAGNOSIS — Z8249 Family history of ischemic heart disease and other diseases of the circulatory system: Secondary | ICD-10-CM | POA: Diagnosis not present

## 2020-11-03 DIAGNOSIS — I2510 Atherosclerotic heart disease of native coronary artery without angina pectoris: Secondary | ICD-10-CM | POA: Diagnosis not present

## 2020-11-03 DIAGNOSIS — Z885 Allergy status to narcotic agent status: Secondary | ICD-10-CM

## 2020-11-03 DIAGNOSIS — L0390 Cellulitis, unspecified: Secondary | ICD-10-CM | POA: Diagnosis not present

## 2020-11-03 DIAGNOSIS — R29898 Other symptoms and signs involving the musculoskeletal system: Secondary | ICD-10-CM | POA: Diagnosis present

## 2020-11-03 DIAGNOSIS — Z21 Asymptomatic human immunodeficiency virus [HIV] infection status: Secondary | ICD-10-CM | POA: Diagnosis present

## 2020-11-03 DIAGNOSIS — I10 Essential (primary) hypertension: Secondary | ICD-10-CM | POA: Diagnosis present

## 2020-11-03 DIAGNOSIS — M4802 Spinal stenosis, cervical region: Secondary | ICD-10-CM | POA: Diagnosis not present

## 2020-11-03 DIAGNOSIS — R531 Weakness: Secondary | ICD-10-CM

## 2020-11-03 DIAGNOSIS — E785 Hyperlipidemia, unspecified: Secondary | ICD-10-CM | POA: Diagnosis present

## 2020-11-03 DIAGNOSIS — F419 Anxiety disorder, unspecified: Secondary | ICD-10-CM | POA: Diagnosis present

## 2020-11-03 DIAGNOSIS — B20 Human immunodeficiency virus [HIV] disease: Secondary | ICD-10-CM | POA: Diagnosis present

## 2020-11-03 DIAGNOSIS — Z888 Allergy status to other drugs, medicaments and biological substances status: Secondary | ICD-10-CM | POA: Diagnosis not present

## 2020-11-03 DIAGNOSIS — Z981 Arthrodesis status: Secondary | ICD-10-CM

## 2020-11-03 DIAGNOSIS — Z8616 Personal history of COVID-19: Secondary | ICD-10-CM

## 2020-11-03 DIAGNOSIS — L0291 Cutaneous abscess, unspecified: Secondary | ICD-10-CM | POA: Diagnosis present

## 2020-11-03 DIAGNOSIS — Z20822 Contact with and (suspected) exposure to covid-19: Secondary | ICD-10-CM | POA: Diagnosis present

## 2020-11-03 LAB — COMPREHENSIVE METABOLIC PANEL
ALT: 45 U/L — ABNORMAL HIGH (ref 0–44)
AST: 40 U/L (ref 15–41)
Albumin: 3.4 g/dL — ABNORMAL LOW (ref 3.5–5.0)
Alkaline Phosphatase: 104 U/L (ref 38–126)
Anion gap: 10 (ref 5–15)
BUN: 14 mg/dL (ref 8–23)
CO2: 29 mmol/L (ref 22–32)
Calcium: 8.8 mg/dL — ABNORMAL LOW (ref 8.9–10.3)
Chloride: 96 mmol/L — ABNORMAL LOW (ref 98–111)
Creatinine, Ser: 1.01 mg/dL (ref 0.61–1.24)
GFR, Estimated: >60 mL/min (ref >60)
Glucose, Bld: 148 mg/dL — ABNORMAL HIGH (ref 70–99)
Potassium: 3.6 mmol/L (ref 3.5–5.1)
Sodium: 135 mmol/L (ref 135–145)
Total Bilirubin: 0.9 mg/dL (ref 0.3–1.2)
Total Protein: 7.2 g/dL (ref 6.5–8.1)

## 2020-11-03 LAB — URINALYSIS, MICROSCOPIC (REFLEX)

## 2020-11-03 LAB — CBC WITH DIFFERENTIAL/PLATELET
Abs Immature Granulocytes: 0.08 10*3/uL — ABNORMAL HIGH (ref 0.00–0.07)
Basophils Absolute: 0 10*3/uL (ref 0.0–0.1)
Basophils Relative: 0 %
Eosinophils Absolute: 0.1 10*3/uL (ref 0.0–0.5)
Eosinophils Relative: 1 %
HCT: 39.7 % (ref 39.0–52.0)
Hemoglobin: 12.9 g/dL — ABNORMAL LOW (ref 13.0–17.0)
Immature Granulocytes: 1 %
Lymphocytes Relative: 13 %
Lymphs Abs: 1.1 10*3/uL (ref 0.7–4.0)
MCH: 31.7 pg (ref 26.0–34.0)
MCHC: 32.5 g/dL (ref 30.0–36.0)
MCV: 97.5 fL (ref 80.0–100.0)
Monocytes Absolute: 0.9 10*3/uL (ref 0.1–1.0)
Monocytes Relative: 11 %
Neutro Abs: 6 10*3/uL (ref 1.7–7.7)
Neutrophils Relative %: 74 %
Platelets: 146 10*3/uL — ABNORMAL LOW (ref 150–400)
RBC: 4.07 MIL/uL — ABNORMAL LOW (ref 4.22–5.81)
RDW: 14.3 % (ref 11.5–15.5)
WBC: 8.1 10*3/uL (ref 4.0–10.5)
nRBC: 0 % (ref 0.0–0.2)

## 2020-11-03 LAB — LACTIC ACID, PLASMA: Lactic Acid, Venous: 1.5 mmol/L (ref 0.5–1.9)

## 2020-11-03 LAB — URINALYSIS, ROUTINE W REFLEX MICROSCOPIC
Bilirubin Urine: NEGATIVE
Glucose, UA: NEGATIVE mg/dL
Ketones, ur: NEGATIVE mg/dL
Leukocytes,Ua: NEGATIVE
Nitrite: NEGATIVE
Protein, ur: 100 mg/dL — AB
Specific Gravity, Urine: 1.02 (ref 1.005–1.030)
pH: 7 (ref 5.0–8.0)

## 2020-11-03 LAB — RAPID URINE DRUG SCREEN, HOSP PERFORMED
Amphetamines: NOT DETECTED
Barbiturates: NOT DETECTED
Benzodiazepines: POSITIVE — AB
Cocaine: NOT DETECTED
Opiates: NOT DETECTED
Tetrahydrocannabinol: NOT DETECTED

## 2020-11-03 LAB — RESPIRATORY PANEL BY RT PCR (FLU A&B, COVID)
Influenza A by PCR: NEGATIVE
Influenza B by PCR: NEGATIVE
SARS Coronavirus 2 by RT PCR: NEGATIVE

## 2020-11-03 LAB — CK: Total CK: 40 U/L — ABNORMAL LOW (ref 49–397)

## 2020-11-03 LAB — PROTIME-INR
INR: 1 (ref 0.8–1.2)
Prothrombin Time: 13 seconds (ref 11.4–15.2)

## 2020-11-03 MED ORDER — LORAZEPAM 2 MG/ML IJ SOLN
1.0000 mg | INTRAMUSCULAR | Status: DC | PRN
  Administered 2020-11-03 (×2): 1 mg via INTRAVENOUS
  Filled 2020-11-03 (×2): qty 1

## 2020-11-03 MED ORDER — CLINDAMYCIN PHOSPHATE 900 MG/50ML IV SOLN
900.0000 mg | Freq: Once | INTRAVENOUS | Status: AC
  Administered 2020-11-03: 900 mg via INTRAVENOUS
  Filled 2020-11-03: qty 50

## 2020-11-03 MED ORDER — ACETAMINOPHEN 500 MG PO TABS
1000.0000 mg | ORAL_TABLET | Freq: Once | ORAL | Status: AC
  Administered 2020-11-03: 1000 mg via ORAL
  Filled 2020-11-03: qty 2

## 2020-11-03 MED ORDER — CLONAZEPAM 0.25 MG PO TBDP
0.5000 mg | ORAL_TABLET | Freq: Three times a day (TID) | ORAL | Status: DC
  Administered 2020-11-03 – 2020-11-05 (×6): 0.5 mg via ORAL
  Filled 2020-11-03 (×6): qty 2

## 2020-11-03 MED ORDER — LIDOCAINE-EPINEPHRINE 1 %-1:100000 IJ SOLN
10.0000 mL | Freq: Once | INTRAMUSCULAR | Status: AC
  Administered 2020-11-03: 10 mL via INTRADERMAL
  Filled 2020-11-03: qty 1

## 2020-11-03 NOTE — ED Provider Notes (Signed)
[facility] EMERGENCY DEPARTMENT Provider Note   CSN: [id number] Arrival date & time: 11/03/20  1118     History Chief Complaint  Patient presents with  . Weakness    Brian Malone is a 69 y.o. male.  HPI Patient reports he awakened with numbness of his right upper extremity 5 days ago.  He reports initially he thought maybe the extremity had fallen asleep but it did not recover normal sensation or function.  Reports it continued to be weak in the weakness advanced over the past 2 days to the point that he can no longer elevate his wrist or move his hand.  He reports he could elevate his arm more and now that is weaker as well.  He also notes that he in the past 2 days he feels that his right toe is dropping and catching.  He reports he stumbled multiple times but has not actually fallen to the ground.  Just starting yesterday he noted a red patch on his right forearm.  He reports that that developed after he had hit the arm against a coffee table about 2 days ago.  He reports it sore now but initially did not seem very painful.  He denies he has had any fevers or chills.  No headache.  No chest pain or shortness of breath.  Patient reports he does have history of IV drug injection but has not used any for the past 2 months.    Past Medical History:  Diagnosis Date  . Anxiety   . Diabetes mellitus without complication ([place])   . HIV (human immunodeficiency virus infection) ([place])   . Hypertension   . Renal disorder     Patient Active Problem List   Diagnosis Date Noted  . Hypotension 05/26/2020  . CRI (chronic renal insufficiency), stage 3 (moderate) ([place]) 05/26/2020  . CAD (coronary artery disease) 05/26/2020  . History of COVID-19 05/26/2020  . Dizziness 12/26/2019  . Chest pain 11/22/2019  . Acute subdural hematoma ([place]) 10/28/2019  . Subdural hematoma ([place]) 10/26/2019  . Dysphagia 06/20/2019  . AKI (acute kidney injury) ([place]) 06/20/2019  . HCAP  (healthcare-associated pneumonia) 06/20/2019  . Normal anion gap metabolic acidosis 17/79/3903  . ARF (acute renal failure) ([place]) 06/19/2019  . Cervical spinal stenosis 05/18/2019  . Lumbar spondylosis 05/18/2019  . Sacroiliitis ([place]) 05/15/2019  . Right foot ulcer ([place]) 05/13/2019  . HIV (human immunodeficiency virus infection) ([place]) 05/13/2019  . Tachycardia 04/16/2019  . Diabetes ([place]) 04/16/2019  . Cellulitis 04/16/2019  . Wound infection after surgery 04/15/2019  . Benign prostatic hyperplasia (BPH) with straining on urination 02/22/2019  . Essential hypertension 02/22/2019  . Post-traumatic osteoarthritis of both ankles 02/22/2019  . Trigeminal neuralgia 02/22/2019  . Uncontrolled type 2 diabetes mellitus with hyperglycemia ([place]) 02/22/2019  . Chronic bilateral low back pain with bilateral sciatica 01/19/2019  . Diabetic autonomic neuropathy associated with type 2 diabetes mellitus ([place]) 01/19/2019  . Migraine with aura and without status migrainosus, not intractable 01/19/2019  . Neck pain 01/19/2019  . Neuropathy of both feet 01/19/2019  . Nocturia more than twice per night 01/07/2015  . Anxiety 10/23/2014  . Candida, oral 10/23/2014  . Depression 10/23/2014  . Insomnia 10/23/2014  . Rash of entire body 10/23/2014    Past Surgical History:  Procedure Laterality Date  . ANKLE ARTHROSCOPY    . APPENDECTOMY    . BACK SURGERY     FUSION  . PENILE PROSTHESIS  REMOVAL  05/2020  Removal due to abscess  . PENILE PROSTHESIS PLACEMENT  04/2020  . THORACOTOMY Right 2008  ?  . TONSILLECTOMY         Family History  Problem Relation Age of Onset  . CAD Mother   . Lung cancer Mother   . CAD Father   . Lung cancer Father   . CAD Brother     Social History   Tobacco Use  . Smoking status: Never Smoker  . Smokeless tobacco: Never Used  Vaping Use  . Vaping Use: Never used  Substance Use Topics  . Alcohol use: Not Currently  . Drug use: Never    Home  Medications Prior to Admission medications   Medication Sig Start Date End Date Taking? Authorizing Provider  AIMOVIG 140 MG/ML SOAJ  09/24/20   [provider]  busPIRone (BUSPAR) 15 MG tablet Take 1 tablet (15 mg total) by mouth 3 (three) times daily. 09/03/20   Connye Burkitt, NP  clonazePAM (KLONOPIN) 0.5 MG tablet Take 0.5 mg by mouth 3 (three) times daily. 10/01/19   [provider]  ezetimibe (ZETIA) 10 MG tablet Take 10 mg by mouth daily.    [provider]  gabapentin (NEURONTIN) 300 MG capsule Take 2 capsules (600 mg total) by mouth 3 (three) times daily. 11/24/19   Geradine Girt, DO  iron polysaccharides (NIFEREX) 150 MG capsule Take by mouth. 08/29/20   [provider]  isosorbide mononitrate (IMDUR) 60 MG 24 hr tablet Take 1 tablet (60 mg total) by mouth daily. 11/29/19 07/22/20  Elouise Munroe, MD  loratadine (CLARITIN) 10 MG tablet Take by mouth.    [provider]  pantoprazole (PROTONIX) 40 MG tablet Take 40 mg by mouth daily before breakfast.  02/12/19   [provider]  propranolol ER (INDERAL LA) 60 MG 24 hr capsule Take by mouth. 08/28/20   [provider]  rizatriptan (MAXALT) 10 MG tablet Take by mouth. 03/21/19   [provider]  Semaglutide,0.25 or 0.5MG /DOS, 2 MG/1.5ML SOPN Inject into the skin. 10/08/19   [provider]  SUMAtriptan 6 MG/0.5ML SOAJ  09/24/20   [provider]  tamsulosin (FLOMAX) 0.4 MG CAPS capsule Take 0.4 mg by mouth at bedtime.  02/12/19   [provider]  tiZANidine (ZANAFLEX) 4 MG tablet Take 4 mg by mouth at bedtime.  03/08/19   [provider]  TRIUMEQ 600-50-300 MG tablet Take 1 tablet by mouth daily.  02/12/19   [provider]  valACYclovir (VALTREX) 1000 MG tablet Take 1,000 mg by mouth 3 (three) times daily as needed (as directed for trigeminal neuralgia flares). Trig neuralgia flare 10/16/19   [provider]  Vitamin D,  Ergocalciferol, (DRISDOL) 1.25 MG (50000 UT) CAPS capsule Take 50,000 Units by mouth every Thursday.  03/21/19   [provider]    Allergies    Adhesive [tape], Cyclobenzaprine, Duloxetine hcl, and Morphine  Review of Systems   Review of Systems 10 systems reviewed and negative except as per HPI Physical Exam Updated Vital Signs BP 134/87 (BP Location: Left Arm)   Pulse (!) 59   Temp 97.9 F (36.6 C) (Oral)   Resp 18   Ht 6' (1.829 m)   Wt 74.8 kg   SpO2 100%   BMI 22.38 kg/m   Physical Exam Constitutional:      Comments: Alert with clear mental status.  Nontoxic.  No respiratory distress.  HENT:     Head: Normocephalic and atraumatic.  Mouth/Throat:     Mouth: Mucous membranes are moist.     Pharynx: Oropharynx is clear.  Eyes:     Extraocular Movements: Extraocular movements intact.     Pupils: Pupils are equal, round, and reactive to light.  Cardiovascular:     Rate and Rhythm: Normal rate and regular rhythm.  Pulmonary:     Effort: Pulmonary effort is normal.     Breath sounds: Normal breath sounds.  Abdominal:     General: There is no distension.     Palpations: Abdomen is soft.     Tenderness: There is no abdominal tenderness. There is no guarding.  Musculoskeletal:     Cervical back: Neck supple.     Comments: Erythematous patch on the dorsal surface of the right forearm approximately 12 x 10 cm.  There is a healing eschar just distal to this.  No fluctuance.  No swelling at the joints.  No joint effusion.  Skin:    General: Skin is warm and dry.  Neurological:     Comments: No confusion.  Normal speech.  Cranial nerves II through XII intact.  Patient has weakness of the right grip strength and right wrist flexion.  Patient can elevate the right upper extremity to approximately 90 degrees.  Normal strength testing left upper extremity.  Right lower extremity patient can elevate and hold slightly against resistance but not consistent with the left.   Patient can easily elevate left lower extremity and hold against resistance.  Psychiatric:        Mood and Affect: Mood normal.     ED Results / Procedures / Treatments   Labs (all labs ordered are listed, but only abnormal results are displayed) Labs Reviewed  COMPREHENSIVE METABOLIC PANEL - Abnormal; Notable for the following components:      Result Value   Chloride 96 (*)    Glucose, Bld 148 (*)    Calcium 8.8 (*)    Albumin 3.4 (*)    ALT 45 (*)    All other components within normal limits  CBC WITH DIFFERENTIAL/PLATELET - Abnormal; Notable for the following components:   RBC 4.07 (*)    Hemoglobin 12.9 (*)    Platelets 146 (*)    Abs Immature Granulocytes 0.08 (*)    All other components within normal limits  URINALYSIS, ROUTINE W REFLEX MICROSCOPIC - Abnormal; Notable for the following components:   Hgb urine dipstick TRACE (*)    Protein, ur 100 (*)    All other components within normal limits  RAPID URINE DRUG SCREEN, HOSP PERFORMED - Abnormal; Notable for the following components:   Benzodiazepines POSITIVE (*)    All other components within normal limits  CK - Abnormal; Notable for the following components:   Total CK 40 (*)    All other components within normal limits  URINALYSIS, MICROSCOPIC (REFLEX) - Abnormal; Notable for the following components:   Bacteria, UA RARE (*)    All other components within normal limits  RESPIRATORY PANEL BY RT PCR (FLU A&B, COVID)  PROTIME-INR  LACTIC ACID, PLASMA  LACTIC ACID, PLASMA    EKG None  Radiology CT Head Wo Contrast  Result Date: 11/03/2020 CLINICAL DATA:  Acute neuro deficit.  Right arm weakness. EXAM: CT HEAD WITHOUT CONTRAST TECHNIQUE: Contiguous axial images were obtained from the base of the skull through the vertex without intravenous contrast. COMPARISON:  CT head 10/24/2020 FINDINGS: Brain: No evidence of acute infarction, hemorrhage, hydrocephalus, extra-axial collection or mass lesion/mass effect.  Vascular:  Negative for hyperdense vessel Skull: Negative Sinuses/Orbits: Paranasal sinuses clear. Bilateral cataract extraction. Other: None IMPRESSION: Negative CT head Electronically Signed   By: Franchot Gallo M.D.   On: 11/03/2020 14:22    Procedures Procedures (including critical care time)  Medications Ordered in ED Medications  LORazepam (ATIVAN) injection 1 mg (1 mg Intravenous Given 11/03/20 1523)    ED Course  I have reviewed the triage vital signs and the nursing notes.  Pertinent labs & imaging results that were available during my care of the patient were reviewed by me and considered in my medical decision making (see chart for details).    MDM Rules/Calculators/A&P                         Consult: Reviewed with Dr. Cheral Marker.  Advises to obtain MRI and then subsequently call neurology based on MRI findings. Consult: Reviewed Dr. Tomi Bamberger for ED to ED transfer to [facility] for MRI.  Patient had onset of acute upper right arm weakness with a wrist drop and increased weakness to abduction at the shoulder.  The symptoms have progressed with increased weakness of the right hand and also now since yesterday reported toe drop on the right.  She had been seen by his PCP 4 days ago and sent to the emergency department for MRI.  This had not gotten done due to complications of patient wearing an ankle bracelet has to be monitored and removed by enforcement.  The day, long portion has come and remove the patient's ankle bracelet.  Patient reports symptoms have advanced since first onset.  We will need to proceed with MRI and further evaluation if indicated.  Due to the symptoms somewhat gradual progression will also contain MRI of the cervical spine.  Patient does have history of IV drug abuse but at this time is not febrile, does not have leukocytosis or signs acutely of epidural abscess.  He does not have headache or neck pain.  Patient does have a patch of erythema on the right forearm  however this does not appear to be the cause of his symptoms.  This seems to have occurred subsequent to the onset of symptoms.  There is no fluctuance.  I would not at this time say there is a tenosynovitis.  I can move the hand and wrist without severe pain to the patient.  Patient may incidentally benefit from treatment for cellulitis, but at this time I cannot attribute all symptoms neuro distinctly to this finding. Final Clinical Impression(s) / ED Diagnoses Final diagnoses:  Right sided weakness    Rx / DC Orders ED Discharge Orders    None       Charlesetta Shanks, MD 11/03/20 1537

## 2020-11-03 NOTE — ED Triage Notes (Signed)
Per EMS pt has need for MRI which he has not been able to get r/t left ankle bractlet.

## 2020-11-03 NOTE — ED Provider Notes (Signed)
[facility] EMERGENCY DEPARTMENT Provider Note   CSN: [id number] Arrival date & time: 11/03/20  1118     History Chief Complaint  Patient presents with  . Weakness    Brian Malone is a 69 y.o. male presents as transfer from OSH for MRI due to RUE and RLE weakness. Patient states he has had waxing/waning but worsening RUE numbness and distal weakness, then developed RLE weakness to point that he has difficulty with ambulation. Seen at OSH and transferred for MRI. No previous similar symptoms.   Also complaining of right forearm pain and concern for abscess.  The history is provided by the patient and medical records.  Neurologic Problem This is a new problem. The current episode started more than 1 week ago. The problem has been gradually worsening. Pertinent negatives include no chest pain, no abdominal pain, no headaches and no shortness of breath. Nothing aggravates the symptoms. Nothing relieves the symptoms.       Past Medical History:  Diagnosis Date  . Anxiety   . Diabetes mellitus without complication ([place])   . HIV (human immunodeficiency virus infection) ([place])   . Hypertension   . Renal disorder     Patient Active Problem List   Diagnosis Date Noted  . Cellulitis of right upper extremity 11/04/2020  . Weakness of right upper extremity 11/04/2020  . Hypotension 05/26/2020  . CRI (chronic renal insufficiency), stage 3 (moderate) ([place]) 05/26/2020  . CAD (coronary artery disease) 05/26/2020  . History of COVID-19 05/26/2020  . Dizziness 12/26/2019  . Chest pain 11/22/2019  . Acute subdural hematoma ([place]) 10/28/2019  . Subdural hematoma ([place]) 10/26/2019  . Dysphagia 06/20/2019  . AKI (acute kidney injury) ([place]) 06/20/2019  . HCAP (healthcare-associated pneumonia) 06/20/2019  . Normal anion gap metabolic acidosis 62/22/9798  . ARF (acute renal failure) ([place]) 06/19/2019  . Cervical spinal stenosis 05/18/2019  . Lumbar spondylosis 05/18/2019   . Sacroiliitis ([place]) 05/15/2019  . Right foot ulcer ([place]) 05/13/2019  . HIV (human immunodeficiency virus infection) ([place]) 05/13/2019  . Tachycardia 04/16/2019  . Diabetes ([place]) 04/16/2019  . Cellulitis 04/16/2019  . Wound infection after surgery 04/15/2019  . Benign prostatic hyperplasia (BPH) with straining on urination 02/22/2019  . Essential hypertension 02/22/2019  . Post-traumatic osteoarthritis of both ankles 02/22/2019  . Trigeminal neuralgia 02/22/2019  . Uncontrolled type 2 diabetes mellitus with hyperglycemia ([place]) 02/22/2019  . Chronic bilateral low back pain with bilateral sciatica 01/19/2019  . Diabetic autonomic neuropathy associated with type 2 diabetes mellitus ([place]) 01/19/2019  . Migraine with aura and without status migrainosus, not intractable 01/19/2019  . Neck pain 01/19/2019  . Neuropathy of both feet 01/19/2019  . Nocturia more than twice per night 01/07/2015  . Anxiety 10/23/2014  . Candida, oral 10/23/2014  . Depression 10/23/2014  . Insomnia 10/23/2014  . Rash of entire body 10/23/2014    Past Surgical History:  Procedure Laterality Date  . ANKLE ARTHROSCOPY    . APPENDECTOMY    . BACK SURGERY     FUSION  . PENILE PROSTHESIS  REMOVAL  05/2020   Removal due to abscess  . PENILE PROSTHESIS PLACEMENT  04/2020  . THORACOTOMY Right 2008  ?  . TONSILLECTOMY         Family History  Problem Relation Age of Onset  . CAD Mother   . Lung cancer Mother   . CAD Father   . Lung cancer Father   . CAD Brother     Social History  Tobacco Use  . Smoking status: Never Smoker  . Smokeless tobacco: Never Used  Vaping Use  . Vaping Use: Never used  Substance Use Topics  . Alcohol use: Not Currently  . Drug use: Never    Home Medications Prior to Admission medications   Medication Sig Start Date End Date Taking? Authorizing Provider  AIMOVIG 140 MG/ML SOAJ  09/24/20   [provider]  busPIRone (BUSPAR) 15 MG tablet Take 1 tablet (15 mg  total) by mouth 3 (three) times daily. 09/03/20   Connye Burkitt, NP  clonazePAM (KLONOPIN) 0.5 MG tablet Take 0.5 mg by mouth 3 (three) times daily. 10/01/19   [provider]  ezetimibe (ZETIA) 10 MG tablet Take 10 mg by mouth daily.    [provider]  gabapentin (NEURONTIN) 300 MG capsule Take 2 capsules (600 mg total) by mouth 3 (three) times daily. 11/24/19   Geradine Girt, DO  iron polysaccharides (NIFEREX) 150 MG capsule Take by mouth. 08/29/20   [provider]  isosorbide mononitrate (IMDUR) 60 MG 24 hr tablet Take 1 tablet (60 mg total) by mouth daily. 11/29/19 07/22/20  Elouise Munroe, MD  loratadine (CLARITIN) 10 MG tablet Take by mouth.    [provider]  pantoprazole (PROTONIX) 40 MG tablet Take 40 mg by mouth daily before breakfast.  02/12/19   [provider]  propranolol ER (INDERAL LA) 60 MG 24 hr capsule Take by mouth. 08/28/20   [provider]  rizatriptan (MAXALT) 10 MG tablet Take by mouth. 03/21/19   [provider]  Semaglutide,0.25 or 0.5MG /DOS, 2 MG/1.5ML SOPN Inject into the skin. 10/08/19   [provider]  SUMAtriptan 6 MG/0.5ML SOAJ  09/24/20   [provider]  tamsulosin (FLOMAX) 0.4 MG CAPS capsule Take 0.4 mg by mouth at bedtime.  02/12/19   [provider]  tiZANidine (ZANAFLEX) 4 MG tablet Take 4 mg by mouth at bedtime.  03/08/19   [provider]  TRIUMEQ 600-50-300 MG tablet Take 1 tablet by mouth daily.  02/12/19   [provider]  valACYclovir (VALTREX) 1000 MG tablet Take 1,000 mg by mouth 3 (three) times daily as needed (as directed for trigeminal neuralgia flares). Trig neuralgia flare 10/16/19   [provider]  Vitamin D, Ergocalciferol, (DRISDOL) 1.25 MG (50000 UT) CAPS capsule Take 50,000 Units by mouth every Thursday.  03/21/19   [provider]    Allergies    Adhesive [tape], Cyclobenzaprine, Duloxetine hcl, and Morphine  Review  of Systems   Review of Systems  Constitutional: Positive for chills. Negative for fatigue and fever.  HENT: Negative for ear pain and sore throat.   Eyes: Negative for pain and visual disturbance.  Respiratory: Negative for cough and shortness of breath.   Cardiovascular: Negative for chest pain and palpitations.  Gastrointestinal: Negative for abdominal pain and vomiting.  Genitourinary: Negative for dysuria and hematuria.  Musculoskeletal: Negative for arthralgias and back pain.  Skin: Positive for wound. Negative for color change and rash.  Neurological: Positive for weakness and numbness. Negative for seizures, syncope and headaches.  All other systems reviewed and are negative.   Physical Exam Updated Vital Signs BP (!) 144/83   Pulse 89   Temp 97.9 F (36.6 C) (Oral)   Resp 18   Ht 6' (1.829 m)   Wt 74.8 kg   SpO2 98%   BMI 22.38 kg/m   Physical Exam Vitals and nursing note reviewed.  Constitutional:  Appearance: He is well-developed.  HENT:     Head: Normocephalic and atraumatic.  Eyes:     Conjunctiva/sclera: Conjunctivae normal.  Cardiovascular:     Rate and Rhythm: Normal rate and regular rhythm.     Heart sounds: No murmur heard.   Pulmonary:     Effort: Pulmonary effort is normal. No respiratory distress.     Breath sounds: Normal breath sounds.  Abdominal:     Palpations: Abdomen is soft.     Tenderness: There is no abdominal tenderness.  Musculoskeletal:        General: Swelling (area of swelling over right lateral forearm with surrounding erythema) present.     Cervical back: Neck supple.  Skin:    General: Skin is warm and dry.  Neurological:     Mental Status: He is alert and oriented to person, place, and time.     Cranial Nerves: No cranial nerve deficit.     Sensory: Sensory deficit (RUE) present.     Motor: Weakness (1/5 in distal RUE, 4/5 in RLE. 5/5 in LUE and LLE) present.     Coordination: Coordination abnormal (unable to move RUE  to participate with testing).     ED Results / Procedures / Treatments   Labs (all labs ordered are listed, but only abnormal results are displayed) Labs Reviewed  COMPREHENSIVE METABOLIC PANEL - Abnormal; Notable for the following components:      Result Value   Chloride 96 (*)    Glucose, Bld 148 (*)    Calcium 8.8 (*)    Albumin 3.4 (*)    ALT 45 (*)    All other components within normal limits  CBC WITH DIFFERENTIAL/PLATELET - Abnormal; Notable for the following components:   RBC 4.07 (*)    Hemoglobin 12.9 (*)    Platelets 146 (*)    Abs Immature Granulocytes 0.08 (*)    All other components within normal limits  URINALYSIS, ROUTINE W REFLEX MICROSCOPIC - Abnormal; Notable for the following components:   Hgb urine dipstick TRACE (*)    Protein, ur 100 (*)    All other components within normal limits  RAPID URINE DRUG SCREEN, HOSP PERFORMED - Abnormal; Notable for the following components:   Benzodiazepines POSITIVE (*)    All other components within normal limits  CK - Abnormal; Notable for the following components:   Total CK 40 (*)    All other components within normal limits  URINALYSIS, MICROSCOPIC (REFLEX) - Abnormal; Notable for the following components:   Bacteria, UA RARE (*)    All other components within normal limits  RESPIRATORY PANEL BY RT PCR (FLU A&B, COVID)  PROTIME-INR  LACTIC ACID, PLASMA  CBC  BASIC METABOLIC PANEL    EKG EKG Interpretation  Date/Time:  Monday November 03 2020 15:56:05 EST Ventricular Rate:  65 PR Interval:    QRS Duration: 97 QT Interval:  426 QTC Calculation: 443 R Axis:   33 Text Interpretation: Sinus rhythm Borderline short PR interval Borderline ST depression, lateral leads Baseline wander in lead(s) V1 V4 When compared with ECG of 09/01/2020, No significant change was found Confirmed by Delora Fuel ([id number]) on 11/04/2020 3:10:08 AM   Radiology CT Head Wo Contrast  Result Date: 11/03/2020 CLINICAL DATA:  Acute  neuro deficit.  Right arm weakness. EXAM: CT HEAD WITHOUT CONTRAST TECHNIQUE: Contiguous axial images were obtained from the base of the skull through the vertex without intravenous contrast. COMPARISON:  CT head 10/24/2020 FINDINGS: Brain: No evidence of acute  infarction, hemorrhage, hydrocephalus, extra-axial collection or mass lesion/mass effect. Vascular: Negative for hyperdense vessel Skull: Negative Sinuses/Orbits: Paranasal sinuses clear. Bilateral cataract extraction. Other: None IMPRESSION: Negative CT head Electronically Signed   By: Franchot Gallo M.D.   On: 11/03/2020 14:22   MR BRAIN WO CONTRAST  Result Date: 11/03/2020 CLINICAL DATA:  Right upper extremity numbness EXAM: MRI HEAD WITHOUT CONTRAST MRI CERVICAL SPINE WITHOUT CONTRAST TECHNIQUE: Multiplanar, multiecho pulse sequences of the brain and surrounding structures, and cervical spine, to include the craniocervical junction and cervicothoracic junction, were obtained without intravenous contrast. COMPARISON:  None. FINDINGS: MRI HEAD FINDINGS Brain: No acute infarct, acute hemorrhage or extra-axial collection. Multifocal hyperintense T2-weighted signal within the white matter. Normal volume of CSF spaces. No chronic microhemorrhage. Normal midline structures. Vascular: Normal flow voids. Skull and upper cervical spine: Normal marrow signal. Sinuses/Orbits: Negative. Other: None. MRI CERVICAL SPINE FINDINGS Alignment: Physiologic. Vertebrae: C3-5 ACDF.  No acute abnormality. Cord: Normal signal and morphology. Posterior Fossa, vertebral arteries, paraspinal tissues: Negative. Disc levels: C1-2: Unremarkable. C2-3: Small disc bulge. There is no spinal canal stenosis. No neural foraminal stenosis. C3-4: ACDF. There is no spinal canal stenosis. Unchanged moderate right and severe left neural foraminal stenosis. C4-5: ACDF. Improved, but still mild spinal canal stenosis. Unchanged severe bilateral neural foraminal stenosis. C5-6: Left subarticular  disc bulge. Worsened moderate spinal canal stenosis. Unchanged mild right and severe left neural foraminal stenosis. C6-7: Small disc bulge. Progression to moderate spinal canal stenosis. Mild right and moderate left neural foraminal stenosis. C7-T1: Normal disc space and facet joints. There is no spinal canal stenosis. No neural foraminal stenosis. IMPRESSION: 1. No acute intracranial abnormality. 2. Multifocal hyperintense T2-weighted signal within the white matter is most commonly due to chronic small vessel ischemia. 3. Worsened moderate C5-6 and C6-7 spinal canal stenosis. 4. Unchanged multilevel moderate-to-severe neural foraminal stenosis. 5. Improved patency of the spinal canal at the C4-5 level following ACDF. Electronically Signed   By: Ulyses Jarred M.D.   On: 11/03/2020 19:53   MR Cervical Spine Wo Contrast  Result Date: 11/03/2020 CLINICAL DATA:  Right upper extremity numbness EXAM: MRI HEAD WITHOUT CONTRAST MRI CERVICAL SPINE WITHOUT CONTRAST TECHNIQUE: Multiplanar, multiecho pulse sequences of the brain and surrounding structures, and cervical spine, to include the craniocervical junction and cervicothoracic junction, were obtained without intravenous contrast. COMPARISON:  None. FINDINGS: MRI HEAD FINDINGS Brain: No acute infarct, acute hemorrhage or extra-axial collection. Multifocal hyperintense T2-weighted signal within the white matter. Normal volume of CSF spaces. No chronic microhemorrhage. Normal midline structures. Vascular: Normal flow voids. Skull and upper cervical spine: Normal marrow signal. Sinuses/Orbits: Negative. Other: None. MRI CERVICAL SPINE FINDINGS Alignment: Physiologic. Vertebrae: C3-5 ACDF.  No acute abnormality. Cord: Normal signal and morphology. Posterior Fossa, vertebral arteries, paraspinal tissues: Negative. Disc levels: C1-2: Unremarkable. C2-3: Small disc bulge. There is no spinal canal stenosis. No neural foraminal stenosis. C3-4: ACDF. There is no spinal canal  stenosis. Unchanged moderate right and severe left neural foraminal stenosis. C4-5: ACDF. Improved, but still mild spinal canal stenosis. Unchanged severe bilateral neural foraminal stenosis. C5-6: Left subarticular disc bulge. Worsened moderate spinal canal stenosis. Unchanged mild right and severe left neural foraminal stenosis. C6-7: Small disc bulge. Progression to moderate spinal canal stenosis. Mild right and moderate left neural foraminal stenosis. C7-T1: Normal disc space and facet joints. There is no spinal canal stenosis. No neural foraminal stenosis. IMPRESSION: 1. No acute intracranial abnormality. 2. Multifocal hyperintense T2-weighted signal within the white matter is most commonly due  to chronic small vessel ischemia. 3. Worsened moderate C5-6 and C6-7 spinal canal stenosis. 4. Unchanged multilevel moderate-to-severe neural foraminal stenosis. 5. Improved patency of the spinal canal at the C4-5 level following ACDF. Electronically Signed   By: Ulyses Jarred M.D.   On: 11/03/2020 19:53    Procedures  Ultrasound ED Soft Tissue  Date/Time: 11/03/2020 10:00 PM Performed by: Darrick Huntsman, MD Authorized by: Elnora Morrison, MD   Procedure details:    Indications: localization of abscess     Transverse view:  Visualized   Longitudinal view:  Visualized   Images: archived   Location:    Location: upper extremity     Side:  Right Findings:     abscess present    cellulitis present .Marland KitchenIncision and Drainage  Date/Time: 11/04/2020 4:02 AM Performed by: Darrick Huntsman, MD Authorized by: Elnora Morrison, MD   Consent:    Consent obtained:  Verbal   Consent given by:  Patient Location:    Type:  Abscess   Location:  Upper extremity   Upper extremity location:  Arm   Arm location:  R lower arm Pre-procedure details:    Skin preparation:  Chloraprep Procedure type:    Complexity:  Simple Procedure details:    Needle aspiration: yes     Needle size:  18 G   Incision types:   Stab incision   Incision depth:  Subcutaneous   Drainage:  Purulent   Drainage amount:  Scant Post-procedure details:    Patient tolerance of procedure:  Tolerated well, no immediate complications   (including critical care time)   Medications Ordered in ED Medications  clonazePAM (KLONOPIN) disintegrating tablet 0.5 mg (0.5 mg Oral Given 11/03/20 2210)  abacavir-dolutegravir-lamiVUDine (TRIUMEQ) 600-50-300 MG per tablet 1 tablet (has no administration in time range)  ezetimibe (ZETIA) tablet 10 mg (has no administration in time range)  isosorbide mononitrate (IMDUR) 24 hr tablet 60 mg (has no administration in time range)  propranolol ER (INDERAL LA) 24 hr capsule 60 mg (has no administration in time range)  busPIRone (BUSPAR) tablet 15 mg (has no administration in time range)  pantoprazole (PROTONIX) EC tablet 40 mg (has no administration in time range)  tamsulosin (FLOMAX) capsule 0.4 mg (has no administration in time range)  gabapentin (NEURONTIN) capsule 600 mg (has no administration in time range)  tiZANidine (ZANAFLEX) tablet 4 mg (has no administration in time range)  insulin aspart (novoLOG) injection 0-9 Units (has no administration in time range)  enoxaparin (LOVENOX) injection 40 mg (has no administration in time range)  vancomycin (VANCOREADY) IVPB 1500 mg/300 mL (1,500 mg Intravenous New Bag/Given 11/04/20 0407)  vancomycin (VANCOREADY) IVPB 750 mg/150 mL (has no administration in time range)  LORazepam (ATIVAN) injection 0.25 mg (has no administration in time range)  lidocaine-EPINEPHrine (XYLOCAINE W/EPI) 1 %-1:100000 (with pres) injection 10 mL (10 mLs Intradermal Given 11/03/20 2312)  clindamycin (CLEOCIN) IVPB 900 mg (0 mg Intravenous Stopped 11/03/20 2333)  acetaminophen (TYLENOL) tablet 1,000 mg (1,000 mg Oral Given 11/03/20 2341)    ED Course  I have reviewed the triage vital signs and the nursing notes.  Pertinent labs & imaging results that were available  during my care of the patient were reviewed by me and considered in my medical decision making (see chart for details).    MDM Rules/Calculators/A&P                          MDM Bejamin Hackbart is a 69  y.o. male who presents with RHB weakness as per above. I have reviewed the nursing documentation for past medical history, family history, and social history. Pertinent previous records reviewed. He is awake, alert. HDS. Afebrile. Physical exam is most notable for RUE and RLE weakness. Abscess noted to right forearm.  ER provider interpretation of Imaging / Radiology: No acute or subacute stroke or cause of weakness on CT or MR Head/C-spine ER provider interpretation of EKG: n/a ER provider interpretation of Labs: CBC, CMP unremarkable. UDS with benzos (given ativan at OSH)  Differentials considered: stroke, mass lesion, spinal cord compression, peripheral neuropathy, epidural abscess  MDM: Stable 89 yoM presents as transfer from OSH due to right-sided weakness over past week. No obvious cause identified on MR brain/C-spine. Neuro consulted and stated this was likely peripheral and would recommend outpatient workup due to unremarkable MR brain/CSpine. Patient does have right forearm cellulitis that has expanded and worsened since being in the ED (over a couple hours). Needle aspirated pus at bedside. However, due to concern for rapidly worsening cellulitis in this high risk patient with HIV, feel that he requires admission for IV antibiotics. Given initial dose clinda IV in ED. Tylenol for pain. Home dose clonopin ordered. Admitted to hospitalist, handoff given. Admitted in stable condition.  The plan for this patient was discussed with Dr. Reather Converse, who voiced agreement and who oversaw evaluation and treatment of this patient.   Final Clinical Impression(s) / ED Diagnoses Final diagnoses:  Right sided weakness    Rx / DC Orders ED Discharge Orders    None       Darrick Huntsman,  MD 11/04/20 [id number]    Elnora Morrison, MD 11/04/20 [phone]

## 2020-11-03 NOTE — ED Triage Notes (Signed)
Pt arrives with c/o right arm weakness X1 week via EMS. Pt has redness and swelling to right arm. EMS denies drift, drop, and denies slurred speech. Increasing lethargy per EMS pt denies drug use. CBG 188. 142/94 BP 99% RA HR 80 NSR

## 2020-11-03 NOTE — ED Notes (Signed)
Report provided to [facility] for transport to [facility] ED

## 2020-11-03 NOTE — ED Notes (Signed)
Pt on monitor and vitals cycling 

## 2020-11-03 NOTE — ED Notes (Signed)
Patient transported to MRI 

## 2020-11-03 NOTE — ED Notes (Signed)
[facility] here to  Take pt to [facility] for MRI

## 2020-11-04 ENCOUNTER — Encounter (HOSPITAL_COMMUNITY): Payer: Self-pay | Admitting: Internal Medicine

## 2020-11-04 ENCOUNTER — Observation Stay (HOSPITAL_COMMUNITY): Payer: Medicare Other

## 2020-11-04 DIAGNOSIS — Z801 Family history of malignant neoplasm of trachea, bronchus and lung: Secondary | ICD-10-CM | POA: Diagnosis not present

## 2020-11-04 DIAGNOSIS — Z21 Asymptomatic human immunodeficiency virus [HIV] infection status: Secondary | ICD-10-CM | POA: Diagnosis present

## 2020-11-04 DIAGNOSIS — D649 Anemia, unspecified: Secondary | ICD-10-CM | POA: Diagnosis present

## 2020-11-04 DIAGNOSIS — L03113 Cellulitis of right upper limb: Principal | ICD-10-CM

## 2020-11-04 DIAGNOSIS — D696 Thrombocytopenia, unspecified: Secondary | ICD-10-CM | POA: Diagnosis present

## 2020-11-04 DIAGNOSIS — L02413 Cutaneous abscess of right upper limb: Secondary | ICD-10-CM | POA: Diagnosis present

## 2020-11-04 DIAGNOSIS — Z91048 Other nonmedicinal substance allergy status: Secondary | ICD-10-CM | POA: Diagnosis not present

## 2020-11-04 DIAGNOSIS — E785 Hyperlipidemia, unspecified: Secondary | ICD-10-CM | POA: Diagnosis present

## 2020-11-04 DIAGNOSIS — Z6822 Body mass index (BMI) 22.0-22.9, adult: Secondary | ICD-10-CM | POA: Diagnosis not present

## 2020-11-04 DIAGNOSIS — E1165 Type 2 diabetes mellitus with hyperglycemia: Secondary | ICD-10-CM | POA: Diagnosis present

## 2020-11-04 DIAGNOSIS — Z981 Arthrodesis status: Secondary | ICD-10-CM | POA: Diagnosis not present

## 2020-11-04 DIAGNOSIS — I2510 Atherosclerotic heart disease of native coronary artery without angina pectoris: Secondary | ICD-10-CM | POA: Diagnosis present

## 2020-11-04 DIAGNOSIS — I10 Essential (primary) hypertension: Secondary | ICD-10-CM

## 2020-11-04 DIAGNOSIS — G629 Polyneuropathy, unspecified: Secondary | ICD-10-CM | POA: Diagnosis present

## 2020-11-04 DIAGNOSIS — L0390 Cellulitis, unspecified: Secondary | ICD-10-CM | POA: Diagnosis present

## 2020-11-04 DIAGNOSIS — R29898 Other symptoms and signs involving the musculoskeletal system: Secondary | ICD-10-CM | POA: Diagnosis present

## 2020-11-04 DIAGNOSIS — L0291 Cutaneous abscess, unspecified: Secondary | ICD-10-CM | POA: Diagnosis present

## 2020-11-04 DIAGNOSIS — F419 Anxiety disorder, unspecified: Secondary | ICD-10-CM | POA: Diagnosis present

## 2020-11-04 DIAGNOSIS — M4802 Spinal stenosis, cervical region: Secondary | ICD-10-CM | POA: Diagnosis present

## 2020-11-04 DIAGNOSIS — Z885 Allergy status to narcotic agent status: Secondary | ICD-10-CM | POA: Diagnosis not present

## 2020-11-04 DIAGNOSIS — Z79899 Other long term (current) drug therapy: Secondary | ICD-10-CM | POA: Diagnosis not present

## 2020-11-04 DIAGNOSIS — Z8616 Personal history of COVID-19: Secondary | ICD-10-CM | POA: Diagnosis not present

## 2020-11-04 DIAGNOSIS — Z20822 Contact with and (suspected) exposure to covid-19: Secondary | ICD-10-CM | POA: Diagnosis present

## 2020-11-04 DIAGNOSIS — E440 Moderate protein-calorie malnutrition: Secondary | ICD-10-CM | POA: Diagnosis present

## 2020-11-04 DIAGNOSIS — Z8249 Family history of ischemic heart disease and other diseases of the circulatory system: Secondary | ICD-10-CM | POA: Diagnosis not present

## 2020-11-04 DIAGNOSIS — Z888 Allergy status to other drugs, medicaments and biological substances status: Secondary | ICD-10-CM | POA: Diagnosis not present

## 2020-11-04 HISTORY — DX: Cellulitis of right upper limb: L03.113

## 2020-11-04 LAB — CBC
HCT: 41.9 % (ref 39.0–52.0)
Hemoglobin: 13.8 g/dL (ref 13.0–17.0)
MCH: 31.9 pg (ref 26.0–34.0)
MCHC: 32.9 g/dL (ref 30.0–36.0)
MCV: 97 fL (ref 80.0–100.0)
Platelets: 147 10*3/uL — ABNORMAL LOW (ref 150–400)
RBC: 4.32 MIL/uL (ref 4.22–5.81)
RDW: 14.1 % (ref 11.5–15.5)
WBC: 7.4 10*3/uL (ref 4.0–10.5)
nRBC: 0 % (ref 0.0–0.2)

## 2020-11-04 LAB — BASIC METABOLIC PANEL
Anion gap: 11 (ref 5–15)
BUN: 13 mg/dL (ref 8–23)
CO2: 27 mmol/L (ref 22–32)
Calcium: 8.8 mg/dL — ABNORMAL LOW (ref 8.9–10.3)
Chloride: 98 mmol/L (ref 98–111)
Creatinine, Ser: 1.11 mg/dL (ref 0.61–1.24)
GFR, Estimated: >60 mL/min (ref >60)
Glucose, Bld: 147 mg/dL — ABNORMAL HIGH (ref 70–99)
Potassium: 3.5 mmol/L (ref 3.5–5.1)
Sodium: 136 mmol/L (ref 135–145)

## 2020-11-04 LAB — CBG MONITORING, ED
Glucose-Capillary: 150 mg/dL — ABNORMAL HIGH (ref 70–99)
Glucose-Capillary: 174 mg/dL — ABNORMAL HIGH (ref 70–99)

## 2020-11-04 LAB — GLUCOSE, CAPILLARY
Glucose-Capillary: 239 mg/dL — ABNORMAL HIGH (ref 70–99)
Glucose-Capillary: 146 mg/dL — ABNORMAL HIGH (ref 70–99)

## 2020-11-04 MED ORDER — EZETIMIBE 10 MG PO TABS
10.0000 mg | ORAL_TABLET | Freq: Every day | ORAL | Status: DC
  Administered 2020-11-04 – 2020-11-05 (×2): 10 mg via ORAL
  Filled 2020-11-04 (×2): qty 1

## 2020-11-04 MED ORDER — VANCOMYCIN HCL 1500 MG/300ML IV SOLN
1500.0000 mg | Freq: Once | INTRAVENOUS | Status: AC
  Administered 2020-11-04: 1500 mg via INTRAVENOUS
  Filled 2020-11-04: qty 300

## 2020-11-04 MED ORDER — LORAZEPAM 2 MG/ML IJ SOLN
0.2500 mg | Freq: Once | INTRAMUSCULAR | Status: AC
  Administered 2020-11-04: 0.25 mg via INTRAVENOUS
  Filled 2020-11-04: qty 1

## 2020-11-04 MED ORDER — GABAPENTIN 300 MG PO CAPS
600.0000 mg | ORAL_CAPSULE | Freq: Three times a day (TID) | ORAL | Status: DC
  Administered 2020-11-04 – 2020-11-05 (×5): 600 mg via ORAL
  Filled 2020-11-04 (×5): qty 2

## 2020-11-04 MED ORDER — PANTOPRAZOLE SODIUM 40 MG PO TBEC
40.0000 mg | DELAYED_RELEASE_TABLET | Freq: Every day | ORAL | Status: DC
  Administered 2020-11-04 – 2020-11-05 (×2): 40 mg via ORAL
  Filled 2020-11-04 (×2): qty 1

## 2020-11-04 MED ORDER — ACETAMINOPHEN 325 MG PO TABS
650.0000 mg | ORAL_TABLET | Freq: Four times a day (QID) | ORAL | Status: DC | PRN
  Administered 2020-11-04: 650 mg via ORAL
  Filled 2020-11-04: qty 2

## 2020-11-04 MED ORDER — BUSPIRONE HCL 15 MG PO TABS
15.0000 mg | ORAL_TABLET | Freq: Three times a day (TID) | ORAL | Status: DC
  Administered 2020-11-04 – 2020-11-05 (×5): 15 mg via ORAL
  Filled 2020-11-04 (×3): qty 1
  Filled 2020-11-04: qty 2
  Filled 2020-11-04 (×2): qty 1

## 2020-11-04 MED ORDER — TAMSULOSIN HCL 0.4 MG PO CAPS
0.4000 mg | ORAL_CAPSULE | Freq: Every day | ORAL | Status: DC
  Administered 2020-11-04: 0.4 mg via ORAL
  Filled 2020-11-04: qty 1

## 2020-11-04 MED ORDER — ISOSORBIDE MONONITRATE ER 60 MG PO TB24
60.0000 mg | ORAL_TABLET | Freq: Every day | ORAL | Status: DC
  Administered 2020-11-04 – 2020-11-05 (×2): 60 mg via ORAL
  Filled 2020-11-04: qty 1
  Filled 2020-11-04: qty 2

## 2020-11-04 MED ORDER — ABACAVIR-DOLUTEGRAVIR-LAMIVUD 600-50-300 MG PO TABS
1.0000 | ORAL_TABLET | Freq: Every day | ORAL | Status: DC
  Administered 2020-11-04 – 2020-11-05 (×2): 1 via ORAL
  Filled 2020-11-04 (×2): qty 1

## 2020-11-04 MED ORDER — TIZANIDINE HCL 4 MG PO TABS
4.0000 mg | ORAL_TABLET | Freq: Every day | ORAL | Status: DC
  Administered 2020-11-04: 2 mg via ORAL
  Filled 2020-11-04 (×2): qty 1

## 2020-11-04 MED ORDER — VANCOMYCIN HCL 750 MG/150ML IV SOLN
750.0000 mg | Freq: Two times a day (BID) | INTRAVENOUS | Status: DC
  Administered 2020-11-04 – 2020-11-05 (×2): 750 mg via INTRAVENOUS
  Filled 2020-11-04 (×4): qty 150

## 2020-11-04 MED ORDER — VANCOMYCIN HCL IN DEXTROSE 1-5 GM/200ML-% IV SOLN: 1000.0000 mg | Freq: Once | INTRAVENOUS | Status: DC

## 2020-11-04 MED ORDER — PROPRANOLOL HCL ER 60 MG PO CP24
60.0000 mg | ORAL_CAPSULE | Freq: Every day | ORAL | Status: DC
  Administered 2020-11-04 – 2020-11-05 (×2): 60 mg via ORAL
  Filled 2020-11-04 (×2): qty 1

## 2020-11-04 MED ORDER — ENOXAPARIN SODIUM 40 MG/0.4ML ~~LOC~~ SOLN
40.0000 mg | SUBCUTANEOUS | Status: DC
  Administered 2020-11-04 – 2020-11-05 (×2): 40 mg via SUBCUTANEOUS
  Filled 2020-11-04 (×2): qty 0.4

## 2020-11-04 MED ORDER — INSULIN ASPART 100 UNIT/ML ~~LOC~~ SOLN
0.0000 [IU] | Freq: Three times a day (TID) | SUBCUTANEOUS | Status: DC
  Administered 2020-11-04: 1 [IU] via SUBCUTANEOUS
  Administered 2020-11-04: 3 [IU] via SUBCUTANEOUS
  Administered 2020-11-04 – 2020-11-05 (×4): 2 [IU] via SUBCUTANEOUS

## 2020-11-04 MED ORDER — MELATONIN 5 MG PO TABS
5.0000 mg | ORAL_TABLET | Freq: Every evening | ORAL | Status: DC | PRN
  Administered 2020-11-04: 5 mg via ORAL
  Filled 2020-11-04: qty 1

## 2020-11-04 NOTE — Care Management Obs Status (Signed)
[place] NOTIFICATION   Patient Details  Name: Brian Malone MRN: [id number] Date of Birth: May 25, 1951   Medicare Observation Status Notification Given:  Yes    [place], [place] 11/04/2020, 3:20 PM

## 2020-11-04 NOTE — H&P (Signed)
History and Physical    FIN HUPP JSE:[id number] DOB: February 03, 1951 DOA: 11/03/2020  PCP: Doreatha Lew, MD  Patient coming from: Home.  Chief Complaint: Right upper extremity weakness.  HPI: Brian Malone is a 69 y.o. male with history of CAD, hypertension, hyperlipidemia, diabetes mellitus type 2, HIV follows with [facility] presents to the ER because of ongoing weakness of the right upper extremity for the last 2 weeks.  Denies any weakness of the lower extremities denies any headache visual symptoms of fall.  Patient is finds it difficult to raise his overall right upper extremity against gravity and dorsiflex his wrist.  Patient also noted increasing redness and swelling in the extensor aspect of his right forearm for last few days.  Denies any insect bite.  ED Course: In the ER patient MRI brain and MRI C-spine were unremarkable.  On-call neurology recommended EMG as outpatient and may get an MRI of the brachial plexus if admitted.  ER physician did a incision and drainage of a small abscess to right forearm and given that patient has immunocompromise status including HIV and diabetes mellitus with patient's boundaries slightly enlarging admitted for further observation for the cellulitis.  Labs show blood glucose of 148 lactic acid 1.5 CK 40 hemoglobin 12.9 platelets 146 EKG shows sinus rhythm.  Review of Systems: As per HPI, rest all negative.   Past Medical History:  Diagnosis Date  . Anxiety   . Diabetes mellitus without complication ([place])   . HIV (human immunodeficiency virus infection) ([place])   . Hypertension   . Renal disorder     Past Surgical History:  Procedure Laterality Date  . ANKLE ARTHROSCOPY    . APPENDECTOMY    . BACK SURGERY     FUSION  . PENILE PROSTHESIS  REMOVAL  05/2020   Removal due to abscess  . PENILE PROSTHESIS PLACEMENT  04/2020  . THORACOTOMY Right 2008  ?  . TONSILLECTOMY       reports that he has never smoked. He has never  used smokeless tobacco. He reports previous alcohol use. He reports that he does not use drugs.  Allergies  Allergen Reactions  . Adhesive [Tape] Other (See Comments)    "Tape will take off my skin, as will Band-Aids"  . Cyclobenzaprine Other (See Comments)    Hallucinations  . Duloxetine Hcl Other (See Comments)    Makes PTSD- induced nightmares more vivid Makes PTSD- induced nightmares more vivid Makes PTSD- induced nightmares more vivid  . Morphine Itching and Other (See Comments)    Hallucinations, aggression, and makes patient altered also      Family History  Problem Relation Age of Onset  . CAD Mother   . Lung cancer Mother   . CAD Father   . Lung cancer Father   . CAD Brother     Prior to Admission medications   Medication Sig Start Date End Date Taking? Authorizing Provider  AIMOVIG 140 MG/ML SOAJ  09/24/20   [provider]  busPIRone (BUSPAR) 15 MG tablet Take 1 tablet (15 mg total) by mouth 3 (three) times daily. 09/03/20   Connye Burkitt, NP  clonazePAM (KLONOPIN) 0.5 MG tablet Take 0.5 mg by mouth 3 (three) times daily. 10/01/19   [provider]  ezetimibe (ZETIA) 10 MG tablet Take 10 mg by mouth daily.    [provider]  gabapentin (NEURONTIN) 300 MG capsule Take 2 capsules (600 mg total) by mouth 3 (three) times daily. 11/24/19  Geradine Girt, DO  iron polysaccharides (NIFEREX) 150 MG capsule Take by mouth. 08/29/20   [provider]  isosorbide mononitrate (IMDUR) 60 MG 24 hr tablet Take 1 tablet (60 mg total) by mouth daily. 11/29/19 07/22/20  Elouise Munroe, MD  loratadine (CLARITIN) 10 MG tablet Take by mouth.    [provider]  pantoprazole (PROTONIX) 40 MG tablet Take 40 mg by mouth daily before breakfast.  02/12/19   [provider]  propranolol ER (INDERAL LA) 60 MG 24 hr capsule Take by mouth. 08/28/20   [provider]  rizatriptan (MAXALT) 10 MG tablet Take by mouth. 03/21/19   [provider]  Semaglutide,0.25 or 0.5MG /DOS, 2 MG/1.5ML SOPN Inject into the skin. 10/08/19   [provider]  SUMAtriptan 6 MG/0.5ML SOAJ  09/24/20   [provider]  tamsulosin (FLOMAX) 0.4 MG CAPS capsule Take 0.4 mg by mouth at bedtime.  02/12/19   [provider]  tiZANidine (ZANAFLEX) 4 MG tablet Take 4 mg by mouth at bedtime.  03/08/19   [provider]  TRIUMEQ 600-50-300 MG tablet Take 1 tablet by mouth daily.  02/12/19   [provider]  valACYclovir (VALTREX) 1000 MG tablet Take 1,000 mg by mouth 3 (three) times daily as needed (as directed for trigeminal neuralgia flares). Trig neuralgia flare 10/16/19   [provider]  Vitamin D, Ergocalciferol, (DRISDOL) 1.25 MG (50000 UT) CAPS capsule Take 50,000 Units by mouth every Thursday.  03/21/19   [provider]    Physical Exam: Constitutional: Moderately built and nourished. Vitals:   11/03/20 1300 11/03/20 1409 11/03/20 1500 11/03/20 1610  BP: (!) 170/88 (!) 167/91 134/87 (!) 149/99  Pulse: (!) 55 (!) 50 (!) 59 62  Resp: 13 20 18 20   Temp:      TempSrc:      SpO2: 100% 100% 100% 98%  Weight:      Height:       Eyes: Anicteric no pallor. ENMT: No discharge from the ears eyes nose or mouth. Neck: No mass felt.  No neck rigidity. Respiratory: No rhonchi or crepitations. Cardiovascular: S1-S2 heard. Abdomen: Soft nontender bowel sounds present. Musculoskeletal: Mild redness of the right extensor aspect of the forearm. Skin: Mild redness of the extensor at the right forearm. Neurologic: Alert awake oriented to time place and person.  Right upper extremity is 3 x 5 in strength.  Unable to dorsiflex his right wrist.  Rest of extremities are 5 x 5. Psychiatric: Appears normal.  Normal affect.   Labs on Admission: I have personally reviewed following labs and imaging studies  CBC: Recent Labs  Lab 11/03/20 1355  WBC 8.1  NEUTROABS 6.0  HGB 12.9*  HCT 39.7  MCV  97.5  PLT 132*   Basic Metabolic Panel: Recent Labs  Lab 11/03/20 1355  NA 135  K 3.6  CL 96*  CO2 29  GLUCOSE 148*  BUN 14  CREATININE 1.01  CALCIUM 8.8*   GFR: Estimated Creatinine Clearance: 76.1 mL/min (by C-G formula based on SCr of 1.01 mg/dL). Liver Function Tests: Recent Labs  Lab 11/03/20 1355  AST 40  ALT 45*  ALKPHOS 104  BILITOT 0.9  PROT 7.2  ALBUMIN 3.4*   No results for input(s): LIPASE, AMYLASE in the last 168 hours. No results for input(s): AMMONIA in the last 168 hours. Coagulation Profile: Recent Labs  Lab 11/03/20 1355  INR 1.0   Cardiac Enzymes: Recent Labs  Lab 11/03/20 1355  CKTOTAL 40*   BNP (last 3 results) No results for input(s): PROBNP in the last 8760 hours. HbA1C: No results for input(s): HGBA1C in the last 72 hours. CBG: No results for input(s): GLUCAP in the last 168 hours. Lipid Profile: No results for input(s): CHOL, HDL, LDLCALC, TRIG, CHOLHDL, LDLDIRECT in the last 72 hours. Thyroid Function Tests: No results for input(s): TSH, T4TOTAL, FREET4, T3FREE, THYROIDAB in the last 72 hours. Anemia Panel: No results for input(s): VITAMINB12, FOLATE, FERRITIN, TIBC, IRON, RETICCTPCT in the last 72 hours. Urine analysis:    Component Value Date/Time   COLORURINE YELLOW 11/03/2020 [place] 11/03/2020 1418   LABSPEC 1.020 11/03/2020 1418   PHURINE 7.0 11/03/2020 1418   GLUCOSEU NEGATIVE 11/03/2020 1418   HGBUR TRACE (A) 11/03/2020 1418   BILIRUBINUR NEGATIVE 11/03/2020 1418   KETONESUR NEGATIVE 11/03/2020 1418   PROTEINUR 100 (A) 11/03/2020 1418   NITRITE NEGATIVE 11/03/2020 1418   LEUKOCYTESUR NEGATIVE 11/03/2020 1418   Sepsis Labs: @LABRCNTIP (procalcitonin:4,lacticidven:4) ) Recent Results (from the past 240 hour(s))  Respiratory Panel by RT PCR (Flu A&B, Covid) - Urine, Clean Catch     Status: None   Collection Time: 11/03/20  2:18 PM   Specimen: Urine, Clean Catch; Nasopharyngeal  Result Value  Ref Range Status   SARS Coronavirus 2 by RT PCR NEGATIVE NEGATIVE Final    Comment: (NOTE) SARS-CoV-2 target nucleic acids are NOT DETECTED.  The SARS-CoV-2 RNA is generally detectable in upper respiratoy specimens during the acute phase of infection. The lowest concentration of SARS-CoV-2 viral copies this assay can detect is 131 copies/mL. A negative result does not preclude SARS-Cov-2 infection and should not be used as the sole basis for treatment or other patient management decisions. A negative result may occur with  improper specimen collection/handling, submission of specimen other than nasopharyngeal swab, presence of viral mutation(s) within the areas targeted by this assay, and inadequate number of viral copies (<131 copies/mL). A negative result must be combined with clinical observations, patient history, and epidemiological information. The expected result is Negative.  Fact Sheet for Patients:  PinkCheek.be  Fact Sheet for Healthcare Providers:  GravelBags.it  This test is no t yet approved or cleared by the [place] FDA and  has been authorized for detection and/or diagnosis of SARS-CoV-2 by FDA under an Emergency Use Authorization (EUA). This EUA will remain  in effect (meaning this test can be used) for the duration of the COVID-19 declaration under Section 564(b)(1) of the Act, 21 U.S.C. section 360bbb-3(b)(1), unless the authorization is terminated or revoked sooner.     Influenza A by PCR NEGATIVE NEGATIVE Final   Influenza B by PCR NEGATIVE NEGATIVE Final    Comment: (NOTE) The Xpert Xpress SARS-CoV-2/FLU/RSV assay is intended as an aid in  the diagnosis of influenza from Nasopharyngeal swab specimens and  should not be used as a sole basis for treatment. Nasal washings and  aspirates are unacceptable for Xpert Xpress SARS-CoV-2/FLU/RSV  testing.  Fact Sheet for  Patients: PinkCheek.be  Fact Sheet for Healthcare Providers: GravelBags.it  This test is not yet approved or cleared by the [place] FDA and  has been authorized for detection and/or diagnosis of SARS-CoV-2 by  FDA under an Emergency Use Authorization (EUA). This EUA will remain  in effect (meaning this test can be used) for the duration of the  Covid-19 declaration under Section 564(b)(1) of the Act, 21  U.S.C. section 360bbb-3(b)(1), unless the authorization is  terminated or revoked. Performed  at [facility], [address] [place], [place] [address]      Radiological Exams on Admission: CT Head Wo Contrast  Result Date: 11/03/2020 CLINICAL DATA:  Acute neuro deficit.  Right arm weakness. EXAM: CT HEAD WITHOUT CONTRAST TECHNIQUE: Contiguous axial images were obtained from the base of the skull through the vertex without intravenous contrast. COMPARISON:  CT head 10/24/2020 FINDINGS: Brain: No evidence of acute infarction, hemorrhage, hydrocephalus, extra-axial collection or mass lesion/mass effect. Vascular: Negative for hyperdense vessel Skull: Negative Sinuses/Orbits: Paranasal sinuses clear. Bilateral cataract extraction. Other: None IMPRESSION: Negative CT head Electronically Signed   By: Franchot Gallo M.D.   On: 11/03/2020 14:22   MR BRAIN WO CONTRAST  Result Date: 11/03/2020 CLINICAL DATA:  Right upper extremity numbness EXAM: MRI HEAD WITHOUT CONTRAST MRI CERVICAL SPINE WITHOUT CONTRAST TECHNIQUE: Multiplanar, multiecho pulse sequences of the brain and surrounding structures, and cervical spine, to include the craniocervical junction and cervicothoracic junction, were obtained without intravenous contrast. COMPARISON:  None. FINDINGS: MRI HEAD FINDINGS Brain: No acute infarct, acute hemorrhage or extra-axial collection. Multifocal hyperintense T2-weighted signal within the white matter. Normal volume of  CSF spaces. No chronic microhemorrhage. Normal midline structures. Vascular: Normal flow voids. Skull and upper cervical spine: Normal marrow signal. Sinuses/Orbits: Negative. Other: None. MRI CERVICAL SPINE FINDINGS Alignment: Physiologic. Vertebrae: C3-5 ACDF.  No acute abnormality. Cord: Normal signal and morphology. Posterior Fossa, vertebral arteries, paraspinal tissues: Negative. Disc levels: C1-2: Unremarkable. C2-3: Small disc bulge. There is no spinal canal stenosis. No neural foraminal stenosis. C3-4: ACDF. There is no spinal canal stenosis. Unchanged moderate right and severe left neural foraminal stenosis. C4-5: ACDF. Improved, but still mild spinal canal stenosis. Unchanged severe bilateral neural foraminal stenosis. C5-6: Left subarticular disc bulge. Worsened moderate spinal canal stenosis. Unchanged mild right and severe left neural foraminal stenosis. C6-7: Small disc bulge. Progression to moderate spinal canal stenosis. Mild right and moderate left neural foraminal stenosis. C7-T1: Normal disc space and facet joints. There is no spinal canal stenosis. No neural foraminal stenosis. IMPRESSION: 1. No acute intracranial abnormality. 2. Multifocal hyperintense T2-weighted signal within the white matter is most commonly due to chronic small vessel ischemia. 3. Worsened moderate C5-6 and C6-7 spinal canal stenosis. 4. Unchanged multilevel moderate-to-severe neural foraminal stenosis. 5. Improved patency of the spinal canal at the C4-5 level following ACDF. Electronically Signed   By: Ulyses Jarred M.D.   On: 11/03/2020 19:53   MR Cervical Spine Wo Contrast  Result Date: 11/03/2020 CLINICAL DATA:  Right upper extremity numbness EXAM: MRI HEAD WITHOUT CONTRAST MRI CERVICAL SPINE WITHOUT CONTRAST TECHNIQUE: Multiplanar, multiecho pulse sequences of the brain and surrounding structures, and cervical spine, to include the craniocervical junction and cervicothoracic junction, were obtained without  intravenous contrast. COMPARISON:  None. FINDINGS: MRI HEAD FINDINGS Brain: No acute infarct, acute hemorrhage or extra-axial collection. Multifocal hyperintense T2-weighted signal within the white matter. Normal volume of CSF spaces. No chronic microhemorrhage. Normal midline structures. Vascular: Normal flow voids. Skull and upper cervical spine: Normal marrow signal. Sinuses/Orbits: Negative. Other: None. MRI CERVICAL SPINE FINDINGS Alignment: Physiologic. Vertebrae: C3-5 ACDF.  No acute abnormality. Cord: Normal signal and morphology. Posterior Fossa, vertebral arteries, paraspinal tissues: Negative. Disc levels: C1-2: Unremarkable. C2-3: Small disc bulge. There is no spinal canal stenosis. No neural foraminal stenosis. C3-4: ACDF. There is no spinal canal stenosis. Unchanged moderate right and severe left neural foraminal stenosis. C4-5: ACDF. Improved, but still mild spinal canal stenosis. Unchanged severe bilateral neural foraminal  stenosis. C5-6: Left subarticular disc bulge. Worsened moderate spinal canal stenosis. Unchanged mild right and severe left neural foraminal stenosis. C6-7: Small disc bulge. Progression to moderate spinal canal stenosis. Mild right and moderate left neural foraminal stenosis. C7-T1: Normal disc space and facet joints. There is no spinal canal stenosis. No neural foraminal stenosis. IMPRESSION: 1. No acute intracranial abnormality. 2. Multifocal hyperintense T2-weighted signal within the white matter is most commonly due to chronic small vessel ischemia. 3. Worsened moderate C5-6 and C6-7 spinal canal stenosis. 4. Unchanged multilevel moderate-to-severe neural foraminal stenosis. 5. Improved patency of the spinal canal at the C4-5 level following ACDF. Electronically Signed   By: Ulyses Jarred M.D.   On: 11/03/2020 19:53    EKG: Independently reviewed.  Normal sinus rhythm.  Assessment/Plan Principal Problem:   Cellulitis of right upper extremity Active Problems:    Cellulitis   HIV (human immunodeficiency virus infection) ([place])   Essential hypertension   Uncontrolled type 2 diabetes mellitus with hyperglycemia ([facility])   CAD (coronary artery disease)   Weakness of right upper extremity    1. Right upper extremity weakness I discussed with on-call neurosurgeon Dr. Demetra Shiner and at this time advised to get a EMG as outpatient given that patient symptoms have been ongoing for more than 2 weeks with MRI brain and C-spine unremarkable.  Neurologist advised it reasonable to get brachial plexus MRI which I ordered and pending. 2. Cellulitis of right lower upper extremity status post incision and drainage of abscess by the ER physician.  Presently on empiric antibiotics.  Per the note from caregiver patient was recently treated for cellulitis of the extremities last month. 3. HIV last viral count was undetectable this month in [facility] Everywhere.  Continue antiretroviral treatment. 4. History of CAD and hypertension on Imdur propranolol and Zetia. 5. Anemia and thrombocytopenia follow CBC. 6. History of anxiety on Klonopin.   DVT prophylaxis: Lovenox. Code Status: Full code. Family Communication: Discussed with patient. Disposition Plan: Home. Consults called: Discussed with neurologist. Admission status: Observation.   Rise Patience MD [facility] Pager [phone].  If 7PM-7AM, please contact night-coverage [email or web address] Password [email or web address]  11/04/2020, 3:03 AM

## 2020-11-04 NOTE — ED Notes (Signed)
Patient CBG was 174.

## 2020-11-04 NOTE — Progress Notes (Signed)
PROGRESS NOTE    Brian Malone  WER:[id number] DOB: 1951-12-15 DOA: 11/03/2020 PCP: [name], [name], MD (Confirm with patient/family/NH records and if not entered, this HAS to be entered at [facility] point of entry. "No PCP" if truly none.)   Chief Complaint  Patient presents with  . Weakness    Brief Narrative:  69 year old male with PMH of drug use and HIV and Diabetes Mellitus who presents to the ED on 10/15 with a right arm abscess s/p I&D.   Assessment & Plan:   Principal Problem:   Cellulitis of right upper extremity Active Problems:   Cellulitis   HIV (human immunodeficiency virus infection) ([place])   Essential hypertension   Uncontrolled type 2 diabetes mellitus with hyperglycemia (HCC)   CAD (coronary artery disease)   Weakness of right upper extremity   Abscess   Mild Purulent Cellulitis of Right Upper Extremity, improved - Continue IV Vancomycin for another day. - We were unable to obtain wound cultures. - Discharge tomorrow on Bactrim or Doxycycline to complete 7 day course.  Acute Neuropathy / RUE weakness: MRI of brain normal.  MRI of C spine shows moderate canal stenosis at C5-C6 and C6-C7.  MRI of brachial plexus unremarkable. - Continue home Gabapentin 600 mg TID. - Follow up with Neurology outpatient.  HIV - continue home HAART therapy - Triumeq.  Diabetes Mellitus Type 2 - Lispro SSi with POC glucose q6hrs  Coronary Artery Disease - Aspirin and Statin  Anxiety Disorder - can given home Klonopin PRN.   DVT prophylaxis: Lovenox Code Status: Full Family Communication: discussed with patient Disposition:   Status is: Inpatient  Remains inpatient appropriate because:Inpatient level of care appropriate due to severity of illness   Dispo: The patient is from: Home              Anticipated d/c is to: Home              Anticipated d/c date is: 1 day              Patient currently is not medically stable to d/c.    Consultants:    None  Procedures:   10/15 I&D performed in ED  Antimicrobials:   10/15 - current: Vancomycin    Subjective: Swelling and erythema have significantly improved.  No purulent drainage.  Full range of motion.  Denies any fevers, chills, chest pain, or shortness of breath.  Objective: Vitals:   11/04/20 0759 11/04/20 1028 11/04/20 1421 11/04/20 1439  BP: (!) 149/89 119/83 113/72 108/73  Pulse: 75 89 67 64  Resp: 18 16 18 18   Temp: 98 F (36.7 C) 98.6 F (37 C) 98.7 F (37.1 C) (!) 97.5 F (36.4 C)  TempSrc: Oral Oral Oral Oral  SpO2: 99% 99% 99% 100%  Weight:      Height:        Intake/Output Summary (Last 24 hours) at 11/04/2020 1612 Last data filed at 11/04/2020 1500 Gross per 24 hour  Intake 140 ml  Output 300 ml  Net -160 ml   Filed Weights   11/03/20 1122  Weight: 74.8 kg    Examination:  General exam: ill appearing male lying in bed, right arm pain, fatigued Respiratory system: Clear to auscultation. Respiratory effort normal. Cardiovascular system: RRR.  Did not appreciate a murmur.  No JVD. No pedal edema. Gastrointestinal system: Abdomen is soft and nondistended.  Normal bowel sounds heard. Central nervous system: Alert and oriented. No focal neurological deficits. Extremities:  Symmetric 5 x 5 power. Skin: right arm swelling and erythema reduced since yesterday.  Margins marked on arm. Psychiatry: appears anxious.    Data Reviewed: I have personally reviewed following labs and imaging studies  CBC: Recent Labs  Lab 11/03/20 1355 11/04/20 0303  WBC 8.1 7.4  NEUTROABS 6.0  --   HGB 12.9* 13.8  HCT 39.7 41.9  MCV 97.5 97.0  PLT 146* 147*    Basic Metabolic Panel: Recent Labs  Lab 11/03/20 1355 11/04/20 0303  NA 135 136  K 3.6 3.5  CL 96* 98  CO2 29 27  GLUCOSE 148* 147*  BUN 14 13  CREATININE 1.01 1.11  CALCIUM 8.8* 8.8*    GFR: Estimated Creatinine Clearance: 69.3 mL/min (by C-G formula based on SCr of 1.11 mg/dL).  Liver  Function Tests: Recent Labs  Lab 11/03/20 1355  AST 40  ALT 45*  ALKPHOS 104  BILITOT 0.9  PROT 7.2  ALBUMIN 3.4*    CBG: Recent Labs  Lab 11/04/20 0756 11/04/20 1129  GLUCAP 150* 174*     Recent Results (from the past 240 hour(s))  Respiratory Panel by RT PCR (Flu A&B, Covid) - Urine, Clean Catch     Status: None   Collection Time: 11/03/20  2:18 PM   Specimen: Urine, Clean Catch; Nasopharyngeal  Result Value Ref Range Status   SARS Coronavirus 2 by RT PCR NEGATIVE NEGATIVE Final    Comment: (NOTE) SARS-CoV-2 target nucleic acids are NOT DETECTED.  The SARS-CoV-2 RNA is generally detectable in upper respiratoy specimens during the acute phase of infection. The lowest concentration of SARS-CoV-2 viral copies this assay can detect is 131 copies/mL. A negative result does not preclude SARS-Cov-2 infection and should not be used as the sole basis for treatment or other patient management decisions. A negative result may occur with  improper specimen collection/handling, submission of specimen other than nasopharyngeal swab, presence of viral mutation(s) within the areas targeted by this assay, and inadequate number of viral copies (<131 copies/mL). A negative result must be combined with clinical observations, patient history, and epidemiological information. The expected result is Negative.  Fact Sheet for Patients:  PinkCheek.be  Fact Sheet for Healthcare Providers:  GravelBags.it  This test is no t yet approved or cleared by the [place] FDA and  has been authorized for detection and/or diagnosis of SARS-CoV-2 by FDA under an Emergency Use Authorization (EUA). This EUA will remain  in effect (meaning this test can be used) for the duration of the COVID-19 declaration under Section 564(b)(1) of the Act, 21 U.S.C. section 360bbb-3(b)(1), unless the authorization is terminated or revoked sooner.      Influenza A by PCR NEGATIVE NEGATIVE Final   Influenza B by PCR NEGATIVE NEGATIVE Final    Comment: (NOTE) The Xpert Xpress SARS-CoV-2/FLU/RSV assay is intended as an aid in  the diagnosis of influenza from Nasopharyngeal swab specimens and  should not be used as a sole basis for treatment. Nasal washings and  aspirates are unacceptable for Xpert Xpress SARS-CoV-2/FLU/RSV  testing.  Fact Sheet for Patients: PinkCheek.be  Fact Sheet for Healthcare Providers: GravelBags.it  This test is not yet approved or cleared by the [place] FDA and  has been authorized for detection and/or diagnosis of SARS-CoV-2 by  FDA under an Emergency Use Authorization (EUA). This EUA will remain  in effect (meaning this test can be used) for the duration of the  Covid-19 declaration under Section 564(b)(1) of the Act, 21  U.S.C.  section 360bbb-3(b)(1), unless the authorization is  terminated or revoked. Performed at [facility], [address] [place], [place] [address]       Radiology Studies: CT Head Wo Contrast  Result Date: 11/03/2020 CLINICAL DATA:  Acute neuro deficit.  Right arm weakness. EXAM: CT HEAD WITHOUT CONTRAST TECHNIQUE: Contiguous axial images were obtained from the base of the skull through the vertex without intravenous contrast. COMPARISON:  CT head 10/24/2020 FINDINGS: Brain: No evidence of acute infarction, hemorrhage, hydrocephalus, extra-axial collection or mass lesion/mass effect. Vascular: Negative for hyperdense vessel Skull: Negative Sinuses/Orbits: Paranasal sinuses clear. Bilateral cataract extraction. Other: None IMPRESSION: Negative CT head Electronically Signed   By: Franchot Gallo M.D.   On: 11/03/2020 14:22   MR BRAIN WO CONTRAST  Result Date: 11/03/2020 CLINICAL DATA:  Right upper extremity numbness EXAM: MRI HEAD WITHOUT CONTRAST MRI CERVICAL SPINE WITHOUT CONTRAST TECHNIQUE: Multiplanar,  multiecho pulse sequences of the brain and surrounding structures, and cervical spine, to include the craniocervical junction and cervicothoracic junction, were obtained without intravenous contrast. COMPARISON:  None. FINDINGS: MRI HEAD FINDINGS Brain: No acute infarct, acute hemorrhage or extra-axial collection. Multifocal hyperintense T2-weighted signal within the white matter. Normal volume of CSF spaces. No chronic microhemorrhage. Normal midline structures. Vascular: Normal flow voids. Skull and upper cervical spine: Normal marrow signal. Sinuses/Orbits: Negative. Other: None. MRI CERVICAL SPINE FINDINGS Alignment: Physiologic. Vertebrae: C3-5 ACDF.  No acute abnormality. Cord: Normal signal and morphology. Posterior Fossa, vertebral arteries, paraspinal tissues: Negative. Disc levels: C1-2: Unremarkable. C2-3: Small disc bulge. There is no spinal canal stenosis. No neural foraminal stenosis. C3-4: ACDF. There is no spinal canal stenosis. Unchanged moderate right and severe left neural foraminal stenosis. C4-5: ACDF. Improved, but still mild spinal canal stenosis. Unchanged severe bilateral neural foraminal stenosis. C5-6: Left subarticular disc bulge. Worsened moderate spinal canal stenosis. Unchanged mild right and severe left neural foraminal stenosis. C6-7: Small disc bulge. Progression to moderate spinal canal stenosis. Mild right and moderate left neural foraminal stenosis. C7-T1: Normal disc space and facet joints. There is no spinal canal stenosis. No neural foraminal stenosis. IMPRESSION: 1. No acute intracranial abnormality. 2. Multifocal hyperintense T2-weighted signal within the white matter is most commonly due to chronic small vessel ischemia. 3. Worsened moderate C5-6 and C6-7 spinal canal stenosis. 4. Unchanged multilevel moderate-to-severe neural foraminal stenosis. 5. Improved patency of the spinal canal at the C4-5 level following ACDF. Electronically Signed   By: Ulyses Jarred M.D.   On:  11/03/2020 19:53   MR CHEST WO CONTRAST  Result Date: 11/04/2020 CLINICAL DATA:  Right upper extremity weakness EXAM: MRI BRACHIAL PLEXUS WITHOUT CONTRAST TECHNIQUE: Multiplanar, multiecho pulse sequences of the neck and surrounding structures were obtained without intravenous contrast. The field of view was focused on the rightbrachial plexus from the neural foramina to the axilla. COMPARISON:  Cervical spine MRI 11/03/2020.  CT chest 11/22/2019 FINDINGS: Technical note: Examination is significantly degraded by patient motion artifact. Spinal cord: Moderate canal stenosis is present at the C5-6 and C6-7 levels, better characterized on dedicated MRI cervical spine exam 11/03/2020. No obvious intrinsic cord signal abnormality on large field-of-view imaging. Brachial plexus: Scalene triangle, costoclavicular space, and pectoralis minor space are within normal limits. No evidence of fibrous band or mass lesion. Roots: Unremarkable. Trunks: Unremarkable. Divisions: Unremarkable. Cords: Unremarkable. Distal brachial plexus/branches: Unremarkable. Muscles and tendons Musculature of the shoulder girdle and visualized chest wall appear normal without edema, atrophy, or fatty infiltration to suggest denervation changes. Rotator cuff tendons  appear grossly intact. Bones No cervical rib. The right first rib is normal in appearance. Visualized marrow structures are unremarkable. No fracture or marrow replacing lesion. Joints Moderate AC joint arthropathy. Sternoclavicular and glenohumeral joints appear within normal limits. No joint effusion. Other findings Feathery soft tissue interspersed with fat located deep to the bilateral scapulae overlying the posterior chest wall, which is larger on the right (series 2 and series 7, image 38). Area of abnormality on the right measures 4.7 x 1.3 x 2.2 cm (series 6, image 19). IMPRESSION: 1. Motion degraded examination. 2. No appreciable abnormality of the right brachial plexus. 3.  Moderate canal stenosis at the C5-6 and C6-7 levels, better characterized on dedicated MRI cervical spine exam 11/03/2020. 4. Incidental note of small bilateral elastofibromas, larger on the right measuring up to 4.7 cm. Electronically Signed   By: Davina Poke D.O.   On: 11/04/2020 08:19   MR Cervical Spine Wo Contrast  Result Date: 11/03/2020 CLINICAL DATA:  Right upper extremity numbness EXAM: MRI HEAD WITHOUT CONTRAST MRI CERVICAL SPINE WITHOUT CONTRAST TECHNIQUE: Multiplanar, multiecho pulse sequences of the brain and surrounding structures, and cervical spine, to include the craniocervical junction and cervicothoracic junction, were obtained without intravenous contrast. COMPARISON:  None. FINDINGS: MRI HEAD FINDINGS Brain: No acute infarct, acute hemorrhage or extra-axial collection. Multifocal hyperintense T2-weighted signal within the white matter. Normal volume of CSF spaces. No chronic microhemorrhage. Normal midline structures. Vascular: Normal flow voids. Skull and upper cervical spine: Normal marrow signal. Sinuses/Orbits: Negative. Other: None. MRI CERVICAL SPINE FINDINGS Alignment: Physiologic. Vertebrae: C3-5 ACDF.  No acute abnormality. Cord: Normal signal and morphology. Posterior Fossa, vertebral arteries, paraspinal tissues: Negative. Disc levels: C1-2: Unremarkable. C2-3: Small disc bulge. There is no spinal canal stenosis. No neural foraminal stenosis. C3-4: ACDF. There is no spinal canal stenosis. Unchanged moderate right and severe left neural foraminal stenosis. C4-5: ACDF. Improved, but still mild spinal canal stenosis. Unchanged severe bilateral neural foraminal stenosis. C5-6: Left subarticular disc bulge. Worsened moderate spinal canal stenosis. Unchanged mild right and severe left neural foraminal stenosis. C6-7: Small disc bulge. Progression to moderate spinal canal stenosis. Mild right and moderate left neural foraminal stenosis. C7-T1: Normal disc space and facet joints.  There is no spinal canal stenosis. No neural foraminal stenosis. IMPRESSION: 1. No acute intracranial abnormality. 2. Multifocal hyperintense T2-weighted signal within the white matter is most commonly due to chronic small vessel ischemia. 3. Worsened moderate C5-6 and C6-7 spinal canal stenosis. 4. Unchanged multilevel moderate-to-severe neural foraminal stenosis. 5. Improved patency of the spinal canal at the C4-5 level following ACDF. Electronically Signed   By: Ulyses Jarred M.D.   On: 11/03/2020 19:53        Scheduled Meds: . abacavir-dolutegravir-lamiVUDine  1 tablet Oral Daily  . busPIRone  15 mg Oral TID  . clonazepam  0.5 mg Oral TID  . enoxaparin (LOVENOX) injection  40 mg Subcutaneous Q24H  . ezetimibe  10 mg Oral Daily  . gabapentin  600 mg Oral TID  . insulin aspart  0-9 Units Subcutaneous TID WC  . isosorbide mononitrate  60 mg Oral Daily  . pantoprazole  40 mg Oral QAC breakfast  . propranolol ER  60 mg Oral Daily  . tamsulosin  0.4 mg Oral QHS  . tiZANidine  4 mg Oral QHS   Continuous Infusions: . vancomycin       LOS: 0 days    Time spent: 30 minutes    George Hugh, MD [facility]  To contact the attending provider between 7A-7P or the covering provider during after hours 7P-7A, please log into the web site [email or web address] and access using universal [place] password for that web site. If you do not have the password, please call the hospital operator.  11/04/2020, 4:12 PM

## 2020-11-04 NOTE — Progress Notes (Signed)
Pharmacy Antibiotic Note  Brian Malone is a 70 y.o. male admitted on 11/03/2020 with cellulitis.  Pharmacy has been consulted for vancomycin dosing.  Plan: Vancomycin 1500mg  x1 then 750mg  IV every 12 hours.  Goal trough 10-15 mcg/mL.  Height: 6' (182.9 cm) Weight: 74.8 kg (165 lb) IBW/kg (Calculated) : 77.6  Temp (24hrs), Avg:97.9 F (36.6 C), Min:97.9 F (36.6 C), Max:97.9 F (36.6 C)  Recent Labs  Lab 11/03/20 1355  WBC 8.1  CREATININE 1.01  LATICACIDVEN 1.5    Estimated Creatinine Clearance: 76.1 mL/min (by C-G formula based on SCr of 1.01 mg/dL).    Allergies  Allergen Reactions  . Adhesive [Tape] Other (See Comments)    "Tape will take off my skin, as will Band-Aids"  . Cyclobenzaprine Other (See Comments)    Hallucinations  . Duloxetine Hcl Other (See Comments)    Makes PTSD- induced nightmares more vivid Makes PTSD- induced nightmares more vivid Makes PTSD- induced nightmares more vivid  . Morphine Itching and Other (See Comments)    Hallucinations, aggression, and makes patient altered also       Thank you for allowing pharmacy to be a part of this patient's care.  Wynona Neat, PharmD, BCPS  11/04/2020 3:05 AM

## 2020-11-05 DIAGNOSIS — E440 Moderate protein-calorie malnutrition: Secondary | ICD-10-CM | POA: Insufficient documentation

## 2020-11-05 LAB — BASIC METABOLIC PANEL
Anion gap: 10 (ref 5–15)
BUN: 19 mg/dL (ref 8–23)
CO2: 25 mmol/L (ref 22–32)
Calcium: 8.9 mg/dL (ref 8.9–10.3)
Chloride: 100 mmol/L (ref 98–111)
Creatinine, Ser: 1.14 mg/dL (ref 0.61–1.24)
GFR, Estimated: >60 mL/min (ref >60)
Glucose, Bld: 185 mg/dL — ABNORMAL HIGH (ref 70–99)
Potassium: 4.2 mmol/L (ref 3.5–5.1)
Sodium: 135 mmol/L (ref 135–145)

## 2020-11-05 LAB — CBC
HCT: 38.8 % — ABNORMAL LOW (ref 39.0–52.0)
Hemoglobin: 13 g/dL (ref 13.0–17.0)
MCH: 31.9 pg (ref 26.0–34.0)
MCHC: 33.5 g/dL (ref 30.0–36.0)
MCV: 95.1 fL (ref 80.0–100.0)
Platelets: 142 10*3/uL — ABNORMAL LOW (ref 150–400)
RBC: 4.08 MIL/uL — ABNORMAL LOW (ref 4.22–5.81)
RDW: 14.3 % (ref 11.5–15.5)
WBC: 6.9 10*3/uL (ref 4.0–10.5)
nRBC: 0 % (ref 0.0–0.2)

## 2020-11-05 LAB — GLUCOSE, CAPILLARY
Glucose-Capillary: 160 mg/dL — ABNORMAL HIGH (ref 70–99)
Glucose-Capillary: 193 mg/dL — ABNORMAL HIGH (ref 70–99)
Glucose-Capillary: 167 mg/dL — ABNORMAL HIGH (ref 70–99)

## 2020-11-05 MED ORDER — DOXYCYCLINE HYCLATE 100 MG PO TBEC: 100.0000 mg | DELAYED_RELEASE_TABLET | Freq: Two times a day (BID) | ORAL | 0 refills | Status: DC

## 2020-11-05 MED ORDER — SULFAMETHOXAZOLE-TRIMETHOPRIM 800-160 MG PO TABS: 1.0000 | ORAL_TABLET | Freq: Two times a day (BID) | ORAL | 0 refills | Status: AC

## 2020-11-05 MED ORDER — GLUCERNA SHAKE PO LIQD: 237.0000 mL | Freq: Three times a day (TID) | ORAL | Status: DC
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
## 2020-11-05 NOTE — Progress Notes (Signed)
Patient voices he falls frequently at home when getting OOB and with walking. Pt requires assistance with ambulation to bathroom this AM with staffs. RN voice concerns to attending MD for possible PT/OT consult.

## 2020-11-05 NOTE — Progress Notes (Signed)
HOSPITAL MEDICINE OVERNIGHT EVENT NOTE    Notified by nursing that patient eloped from his room without waiting for medical transport as recommended by daytime attending provider.  Patient was hospitalized for cellulitis of the right upper extremity and has already been discharged by the daytime provider.  Both daytime attending provider and nursing informed patient multiple times the patient is unsafe to leave on his own hence why medical transport was arranged for him.  Unfortunately he has eloped and sought other means of transport against medical advice.    Vernelle Emerald  MD [facility]

## 2020-11-05 NOTE — Progress Notes (Signed)
Initial Nutrition Assessment  DOCUMENTATION CODES:   Non-severe (moderate) malnutrition in context of chronic illness  INTERVENTION:  Provide Glucerna Shake po TID, each supplement provides 220 kcal and 10 grams of protein.  Encourage adequate PO intake.   NUTRITION DIAGNOSIS:   Moderate Malnutrition related to chronic illness (HIV) as evidenced by moderate fat depletion, moderate muscle depletion.  GOAL:   Patient will meet greater than or equal to 90% of their needs  MONITOR:   PO intake, Supplement acceptance, Skin, Weight trends, Labs, I & O's  REASON FOR ASSESSMENT:   Malnutrition Screening Tool    ASSESSMENT:   69 year old male with PMH of drug use and HIV and Diabetes Mellitus who presents with right arm abscess s/p I&D.  Meal completion has been 100%. Pt reports having a decreased appetite PTA with usual consumption of only 1-2 meals a day. Pt reports he used to consume Glucerna shakes at home at least 1-2 daily, however has not been able to purchase them within the past couple of months due to financial status. Pt plans to speak with social work for for aid. Pt reports weight loss since March 2021. Per weight records pt with a 8% weight loss in 9 months. RD to order nutritional supplements to aid in caloric and protein needs. Pt encouraged to eat his food at meals and to drink his supplements.   NUTRITION - FOCUSED PHYSICAL EXAM:    Most Recent Value  Orbital Region Moderate depletion  Upper Arm Region Moderate depletion  Thoracic and Lumbar Region Moderate depletion  Buccal Region Moderate depletion  Temple Region Moderate depletion  Clavicle Bone Region Severe depletion  Clavicle and Acromion Bone Region Moderate depletion  Scapular Bone Region Unable to assess  Dorsal Hand Unable to assess  Patellar Region Mild depletion  Anterior Thigh Region Mild depletion  Posterior Calf Region Mild depletion  Edema (RD Assessment) None  Hair Reviewed  Eyes Reviewed   Mouth Reviewed  Skin Reviewed  Nails Reviewed     Labs and medications reviewed.   Diet Order:   Diet Order            Diet - low sodium heart healthy           Diet - low sodium heart healthy           Diet Carb Modified           Diet heart healthy/carb modified Room service appropriate? Yes; Fluid consistency: Thin  Diet effective now                 EDUCATION NEEDS:   Education needs have been addressed  Skin:  Skin Assessment: Reviewed RN Assessment  Last BM:  11/15  Height:   Ht Readings from Last 1 Encounters:  11/03/20 6' (1.829 m)    Weight:   Wt Readings from Last 1 Encounters:  11/03/20 74.8 kg    BMI:  Body mass index is 22.38 kg/m.  Estimated Nutritional Needs:   Kcal:  2050-2250  Protein:  100-115 grams  Fluid:  >/= 2 L/day  Corrin Parker, MS, RD, LDN RD pager number/after hours weekend pager number on Amion.

## 2020-11-05 NOTE — Progress Notes (Addendum)
Pt with order of discharge via PTAR. Pt is  anxious to leave was told to wait for PTAR. Security called noted pt was not in the room anymore. Pt left without discharge instruction packet. Camera operator and Conservation officer, historic buildings informed. Dr Cyd Silence also informed.

## 2020-11-05 NOTE — TOC Initial Note (Addendum)
Transition of Care [facility]) - Initial/Assessment Note    Patient Details  Name: Brian Malone MRN: [id number] Date of Birth: [birth year]  Transition of Care [facility]) CM/SW Contact:    Marilu Favre, RN Phone Number: 11/05/2020, 2:51 PM  Clinical Narrative:                 Patient from home alone. Discussed OT and PT recommendations. Patient does not want a 3 in 1 , agreeable to walker. Ordered walker with Freda Munro with [facility] .   Patient lives with roommate , does not want to go to SNF, he wants home health services with no preference in agency.   [facility] left voicemail . [facility] accepted.   Asked NCM to assist him in applying for Medicaid. NCM explained process. Patient states he has applied for medicaid but makes too money to qualify.   Patient requesting taxi voucher home , due to vertigo , NCM offered ambulance transport home. Will discuss with MD. MD currently in room with patient   PTAR paperwork on shadow chart. PTAR called for 5pm, several ahead of him.  Expected Discharge Plan: [place]     Patient Goals and CMS Choice Patient states their goals for this hospitalization and ongoing recovery are:: to return to home CMS Medicare.gov Compare Post Acute Care list provided to:: Patient Choice offered to / list presented to : Patient  Expected Discharge Plan and Services Expected Discharge Plan: [place] Choice: [place] arrangements for the past 2 months: Apartment                 DME Arranged: Walker rolling DME Agency: [employer] Date DME Agency Contacted: 11/05/20 Time DME Agency Contacted: [phone] Representative spoke with at DME Agency: [place] Arranged: PT, OT          Prior Living Arrangements/Services Living arrangements for the past 2 months: [place] with:: Self Patient language and need for interpreter reviewed:: Yes            Current home  services: DME Criminal Activity/Legal Involvement Pertinent to Current Situation/Hospitalization: No - Comment as needed  Activities of Daily Living Home Assistive Devices/Equipment: None ADL Screening (condition at time of admission) Patient's cognitive ability adequate to safely complete daily activities?: Yes Is the patient deaf or have difficulty hearing?: Yes Does the patient have difficulty seeing, even when wearing glasses/contacts?: No Does the patient have difficulty concentrating, remembering, or making decisions?: No Patient able to express need for assistance with ADLs?: Yes Does the patient have difficulty dressing or bathing?: Yes Independently performs ADLs?: Yes (appropriate for developmental age) Does the patient have difficulty walking or climbing stairs?: No Weakness of Legs: Right Weakness of Arms/Hands: Right  Permission Sought/Granted   Permission granted to share information with : No              Emotional Assessment Appearance:: Appears stated age Attitude/Demeanor/Rapport: Engaged Affect (typically observed): Accepting Orientation: : Oriented to Self, Oriented to Place, Oriented to  Time, Oriented to Situation Alcohol / Substance Use: Not Applicable Psych Involvement: No (comment)  Admission diagnosis:  Cellulitis [L03.90] Right sided weakness [R53.1] Abscess [L02.91] Patient Active Problem List   Diagnosis Date Noted  . Cellulitis of right upper extremity 11/04/2020  . Weakness of right upper extremity 11/04/2020  . Abscess 11/04/2020  . Hypotension 05/26/2020  . CRI (chronic renal insufficiency), stage 3 (  moderate) ([place]) 05/26/2020  . CAD (coronary artery disease) 05/26/2020  . History of COVID-19 05/26/2020  . Dizziness 12/26/2019  . Chest pain 11/22/2019  . Acute subdural hematoma ([place]) 10/28/2019  . Subdural hematoma ([place]) 10/26/2019  . Dysphagia 06/20/2019  . AKI (acute kidney injury) ([place]) 06/20/2019  . HCAP (healthcare-associated  pneumonia) 06/20/2019  . Normal anion gap metabolic acidosis 77/10/6578  . ARF (acute renal failure) ([place]) 06/19/2019  . Cervical spinal stenosis 05/18/2019  . Lumbar spondylosis 05/18/2019  . Sacroiliitis ([place]) 05/15/2019  . Right foot ulcer ([place]) 05/13/2019  . HIV (human immunodeficiency virus infection) ([place]) 05/13/2019  . Tachycardia 04/16/2019  . Diabetes ([place]) 04/16/2019  . Cellulitis 04/16/2019  . Wound infection after surgery 04/15/2019  . Benign prostatic hyperplasia (BPH) with straining on urination 02/22/2019  . Essential hypertension 02/22/2019  . Post-traumatic osteoarthritis of both ankles 02/22/2019  . Trigeminal neuralgia 02/22/2019  . Uncontrolled type 2 diabetes mellitus with hyperglycemia ([place]) 02/22/2019  . Chronic bilateral low back pain with bilateral sciatica 01/19/2019  . Diabetic autonomic neuropathy associated with type 2 diabetes mellitus ([place]) 01/19/2019  . Migraine with aura and without status migrainosus, not intractable 01/19/2019  . Neck pain 01/19/2019  . Neuropathy of both feet 01/19/2019  . Nocturia more than twice per night 01/07/2015  . Anxiety 10/23/2014  . Candida, oral 10/23/2014  . Depression 10/23/2014  . Insomnia 10/23/2014  . Rash of entire body 10/23/2014   PCP:  Doreatha Lew, MD Pharmacy:   [facility] [phone] - [place], [address] AT [facility] [address] [place] [address] Phone: [phone] Fax: [place], [place] - [place] [address] [place] [address] Phone: [phone] Fax: [facility] #[id number] - [place], [place] - [address] AT [place] [address] [place] [address] Phone: [phone] Fax: [phone]  [place], [place]. [place] [place] [address] Phone:  [phone] Fax: [place], [place] [address] Phone: [phone] Fax: [phone]     Social Determinants of Health (SDOH) Interventions    Readmission Risk Interventions No flowsheet data found.

## 2020-11-05 NOTE — Evaluation (Signed)
Occupational Therapy Evaluation Patient Details Name: Brian Malone MRN: [id number] DOB: Mar 31, 1951 Today's Date: 11/05/2020    History of Present Illness Pt is a 22 y/p male with PMH of drug use, HIV, DM, CAD, HTN, presenting to ED with R arm weakess x 2 weeks.  Abscess found R UE s/p I&D by ER physician. 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    Clinical Impression   PTA patient reports independent with ADLs, mobility. Admitted for above and limited by problem list below, including R dominant UE decreased functional use (decreased sensation, coordination, weakness (wrist drop)), impaired balance, decreased safety awareness.  He is dizzy upon sitting EOB, but reports 1 year history of this, and it clears within several minutes of sitting EOB. He currently requires overall min assist for ADLs, min guard for transfers and in room mobility using RW (progressing to no AD per pt preference, but mild unsteadiness).  Education initiated on compensatory techniques and safety due to decreased functional use of R UE.  Will follow acutely and monitor for appropriate splinting needs for R UE. At this time recommend [place] services.      Follow Up Recommendations  Home health OT;Supervision - Intermittent    Equipment Recommendations  3 in 1 bedside commode;Other (comment) (RW)    Recommendations for Other Services       Precautions / Restrictions Precautions Precautions: Fall Restrictions Weight Bearing Restrictions: No      Mobility Bed Mobility Overal bed mobility: Needs Assistance Bed Mobility: Supine to Sit     Supine to sit: Supervision     General bed mobility comments: increased time but no assist required    Transfers Overall transfer level: Needs assistance Equipment used: Rolling walker (2 wheeled) Transfers: Sit to/from Stand Sit to Stand: Min guard         General transfer comment: cueing for  hand placement and safety, min guard for balance     Balance Overall balance assessment: Needs assistance Sitting-balance support: No upper extremity supported;Feet supported Sitting balance-Leahy Scale: Good Sitting balance - Comments: dynamically during ADls with supervision    Standing balance support: No upper extremity supported;Bilateral upper extremity supported;During functional activity Standing balance-Leahy Scale: Fair Standing balance comment: able to take step and groom without UE support, but requires min guard for mild unsteadiness                            ADL either performed or assessed with clinical judgement   ADL Overall ADL's : Needs assistance/impaired Eating/Feeding: Set up;Sitting   Grooming: Minimal assistance;Standing   Upper Body Bathing: Minimal assistance;Sitting   Lower Body Bathing: Minimal assistance;Sit to/from stand   Upper Body Dressing : Minimal assistance;Sitting   Lower Body Dressing: Minimal assistance;Sit to/from stand Lower Body Dressing Details (indicate cue type and reason): able to don socks with increased time, min assist in standing  Toilet Transfer: Ambulation;Min guard;RW           Functional mobility during ADLs: Min guard;Rolling walker;Cueing for sequencing;Cueing for safety General ADL Comments: pt limited by decreased functional use of R dominant UE, impaired balance     Vision Baseline Vision/History: Wears glasses Wears Glasses: Reading only Patient Visual Report: No change from baseline (recently had cataract surgery with improved vision) Vision Assessment?: No apparent visual deficits Additional Comments: nystagmus with inital sitting at EOB, but given a few minutes cleared and  no other deficits noted     Perception     Praxis      Pertinent Vitals/Pain Pain Assessment: No/denies pain     Hand Dominance Right   Extremity/Trunk Assessment Upper Extremity Assessment Upper Extremity Assessment:  RUE deficits/detail RUE Deficits / Details: decreased functional use distally, 4/5 MMT shoulder, 3+/5 MMT elbow, no active wrist extension, limited grasp (3-/5 MMT) and [place]; gross coordination WFL with finger to nose but wrist drop throughout; PROM WFL  RUE Sensation: decreased light touch;decreased proprioception RUE Coordination: decreased fine motor   Lower Extremity Assessment Lower Extremity Assessment: Defer to PT evaluation   Cervical / Trunk Assessment Cervical / Trunk Assessment: Other exceptions Cervical / Trunk Exceptions: forward head   Communication Communication Communication: No difficulties   Cognition Arousal/Alertness: Awake/alert Behavior During Therapy: WFL for tasks assessed/performed Overall Cognitive Status: Impaired/Different from baseline Area of Impairment: Safety/judgement                         Safety/Judgement: Decreased awareness of safety     General Comments: some decreased safety awareness noted   General Comments  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, with transitional vertigo since; dizziness from supine to EOB but after several minutes cleared with no other symptoms noted     Exercises     Shoulder Instructions      Home Living Family/patient expects to be discharged to:: Private residence Living Arrangements: Alone   Type of Home: Apartment (studio ) Home Access: Stairs to enter;Elevator Management consultant as well ) Entrance Stairs-Number of Steps: 3 flights  Entrance Stairs-Rails: Right;Left Home Layout: One level     Bathroom Shower/Tub: Occupational psychologist: Standard     Home Equipment: Cane - single point;Shower seat - built in;Grab bars - tub/shower          Prior Functioning/Environment Level of Independence: Independent        Comments: independent ADLs, IADls; doesn't drive but walks to the store or takes public transportation         OT Problem List: Decreased range of motion;Decreased  strength;Decreased activity tolerance;Impaired balance (sitting and/or standing);Decreased coordination;Decreased safety awareness;Decreased knowledge of use of DME or AE;Decreased knowledge of precautions;Impaired UE functional use;Impaired sensation      OT Treatment/Interventions: Self-care/ADL training;Therapeutic exercise;DME and/or AE instruction;Therapeutic activities;Patient/family education;Balance training    OT Goals(Current goals can be found in the care plan section) Acute Rehab OT Goals Patient Stated Goal: to use my right hand and to go home  OT Goal Formulation: With patient Time For Goal Achievement: 11/19/20 Potential to Achieve Goals: Good  OT Frequency: Min 2X/week   Barriers to D/C:            Co-evaluation              AM-PAC OT "6 Clicks" Daily Activity     Outcome Measure Help from another person eating meals?: A Little Help from another person taking care of personal grooming?: A Little Help from another person toileting, which includes using toliet, bedpan, or urinal?: A Little Help from another person bathing (including washing, rinsing, drying)?: A Little Help from another person to put on and taking off regular upper body clothing?: A Little Help from another person to put on and taking off regular lower body clothing?: A Little 6 Click Score: 18   End of Session Equipment Utilized During Treatment: Gait belt;Rolling walker Nurse Communication: Mobility status;Other (comment) (position)  Activity Tolerance: Patient tolerated treatment well Patient left: with call bell/phone within reach;Other (comment);with bed alarm set (seated EOB )  OT Visit Diagnosis: Other abnormalities of gait and mobility (R26.89);Muscle weakness (generalized) (M62.81);Other symptoms and signs involving the nervous system (R29.898);History of falling (Z91.81)                Time: 1103-1140 OT Time Calculation (min): 37 min Charges:  OT General Charges $OT Visit: 1  Visit OT Evaluation $OT Eval Moderate Complexity: 1 Mod OT Treatments $Self Care/Home Management : 8-22 mins  Jolaine Artist, OT Acute Rehabilitation Services Pager [phone] Office [phone]   Delight Stare 11/05/2020, 12:17 PM

## 2020-11-05 NOTE — Discharge Instructions (Signed)
Advised to follow-up with primary care physician in 1 week Advised to take doxycycline twice a day for 7 more days for cellulitis. Advised to follow-up with neurology as an outpatient to follow-up on MRI repo

## 2020-11-05 NOTE — Discharge Summary (Signed)
Physician Discharge Summary  JACQUEL MCCAMISH TDV:[id number] DOB: 16-Jul-1951 DOA: 11/03/2020  PCP: Doreatha Lew, MD  Admit date: 11/03/2020   Discharge date: 11/05/2020  Admitted From:  Home Disposition: Home with home services (PT and OT)  Recommendations for Outpatient Follow-up:  1. Follow up with PCP in 1-2 weeks 2. Please obtain BMP/CBC in one week 3. Advised to take  Bactrim DS twice a day for 7 more days for cellulitis. 4. Advised to follow-up with neurology as an outpatient to follow-up on MRI repo  Home Health: Home PT/ OT Equipment/Devices:  None  Discharge Condition: Stable CODE STATUS:Full code Diet recommendation: Heart Healthy   Brief Summary / Hospital course: This 69 yrs old Male with medical history significant of CAD, hypertension, hyperlipidemia, diabetes mellitus type 2, HIV follows with [facility] presented to the ED because of ongoing weakness of the right upper extremity for the last 2 weeks.  Patient denies any weakness of the lower extremities,  denies any headache visual symptoms  Or fall.  Patient  finds it difficult to raise his overall right upper extremity against gravity and dorsiflex his wrist.  Patient also noted increasing redness and swelling in the extensor aspect of his right forearm for last few days.  Denies any insect bite. In the ER MRI brain and MRI C-spine were unremarkable.  On-call neurology recommended EMG as outpatient and may get an MRI of the brachial plexus if admitted.  ER physician did  incision and drainage of small abscess to right forearm and given that patient has immunocompromise status including HIV and diabetes mellitus with patient's comorbidities ,  He was admitted for observation for the cellulitis.    MRI brachial plexus was unremarkable.  Patient was started on vancomycin.  Patient has received 3 doses so far,  Patient is being discharged home on Bactrim twice a day for 7 days.  Patient will follow up with outpatient  neurology for further evaluation.  PT recommended skilled nursing facility but Patient has refused.  Home with home PT and OT has been arranged.  Patient is being discharged home.   He was managed for below problems   Discharge Diagnoses:  Principal Problem:   Cellulitis of right upper extremity Active Problems:   Cellulitis   HIV (human immunodeficiency virus infection) ([place])   Essential hypertension   Uncontrolled type 2 diabetes mellitus with hyperglycemia (HCC)   CAD (coronary artery disease)   Weakness of right upper extremity   Abscess   Malnutrition of moderate degree  Mild Purulent Cellulitis of Right Upper Extremity>>> improved - started on IV Vancomycin. - We were unable to obtain wound cultures. -Cellulitis is significantly improved.  patient is being discharged on doxycycline twice a day for 7 days.  Acute Neuropathy / RUE weakness:  MRI of brain normal.  MRI of C spine shows moderate canal stenosis at C5-C6 and C6-C7.  MRI of brachial plexus unremarkable. - Continue home Gabapentin 600 mg TID. - Follow up with Neurology outpatient.  HIV - continue home HAART therapy - Triumeq.  Diabetes Mellitus Type 2 - Lispro SSi with POC glucose q6hrs  Coronary Artery Disease - Aspirin and Statin  Anxiety Disorder - can given home Klonopin PRN.  Discharge Instructions  Discharge Instructions    Call MD for:  difficulty breathing, headache or visual disturbances   Complete by: As directed    Call MD for:  persistant dizziness or light-headedness   Complete by: As directed    Call MD for:  persistant nausea and vomiting   Complete by: As directed    Call MD for:  severe uncontrolled pain   Complete by: As directed    Call MD for:  temperature >100.4   Complete by: As directed    Diet - low sodium heart healthy   Complete by: As directed    Diet - low sodium heart healthy   Complete by: As directed    Diet Carb Modified   Complete by: As directed     Discharge instructions   Complete by: As directed    Advised to follow-up with primary care physician in 1 week Advised to take doxycycline twice a day for 7 more days for cellulitis. Advised to follow-up with neurology as an outpatient to follow-up on MRI report.   Increase activity slowly   Complete by: As directed    Increase activity slowly   Complete by: As directed      Allergies as of 11/05/2020      Reactions   Adhesive [tape] Other (See Comments)   "Tape will take off my skin, as will Band-Aids"   Cyclobenzaprine Other (See Comments)   Hallucinations   Duloxetine Hcl Other (See Comments)   Makes PTSD- induced nightmares more vivid   Morphine Itching, Other (See Comments)   Hallucinations, aggression, and makes patient altered also      Medication List    TAKE these medications   Aimovig 140 MG/ML Soaj Generic drug: Erenumab-aooe Inject 140 mg into the skin every 30 (thirty) days.   busPIRone 15 MG tablet Commonly known as: BUSPAR Take 1 tablet (15 mg total) by mouth 3 (three) times daily. What changed:   how much to take  when to take this   clonazePAM 0.5 MG tablet Commonly known as: KLONOPIN Take 0.5 mg by mouth 3 (three) times daily.   ezetimibe 10 MG tablet Commonly known as: ZETIA Take 10 mg by mouth daily.   gabapentin 300 MG capsule Commonly known as: Neurontin Take 2 capsules (600 mg total) by mouth 3 (three) times daily.   iron polysaccharides 150 MG capsule Commonly known as: NIFEREX Take 150 mg by mouth daily.   isosorbide mononitrate 60 MG 24 hr tablet Commonly known as: IMDUR Take 1 tablet (60 mg total) by mouth daily.   loratadine 10 MG tablet Commonly known as: CLARITIN Take 10 mg by mouth daily.   pantoprazole 40 MG tablet Commonly known as: PROTONIX Take 40 mg by mouth daily before breakfast.   propranolol ER 60 MG 24 hr capsule Commonly known as: INDERAL LA Take 60 mg by mouth daily.   Semaglutide(0.25 or 0.5MG /DOS) 2  MG/1.5ML Sopn Inject 0.5 mg into the skin every Friday.   sulfamethoxazole-trimethoprim 800-160 MG tablet Commonly known as: BACTRIM DS Take 1 tablet by mouth 2 (two) times daily for 7 days.   tamsulosin 0.4 MG Caps capsule Commonly known as: FLOMAX Take 0.4 mg by mouth at bedtime.   tiZANidine 4 MG tablet Commonly known as: ZANAFLEX Take 4 mg by mouth at bedtime.   traMADol 50 MG tablet Commonly known as: ULTRAM Take 50 mg by mouth 2 (two) times daily.   Triumeq 600-50-300 MG tablet Generic drug: abacavir-dolutegravir-lamiVUDine Take 1 tablet by mouth daily.   valACYclovir 1000 MG tablet Commonly known as: VALTREX Take 1,000 mg by mouth 3 (three) times daily as needed (as directed for trigeminal neuralgia flares). Trig neuralgia flare   Vitamin D (Ergocalciferol) 1.25 MG (50000 UNIT) Caps capsule Commonly known as:  DRISDOL Take 50,000 Units by mouth every Thursday.            Durable Medical Equipment  (From admission, onward)         Start     Ordered   11/05/20 1447  For home use only DME Walker rolling  Once       Question Answer Comment  Walker: With 5 Inch Wheels   Patient needs a walker to treat with the following condition Weakness      11/05/20 1447          Follow-up Information    [name], [name], MD Follow up in 1 week(s).   Specialty: Family Medicine Contact information: [place] [address] [phone]        Elouise Munroe, MD .   Specialties: Cardiology, Radiology Contact information: [address] [place] [address] [place] [address] [phone]        [place] Follow up in 1 week(s).   Contact information: [place] [place] [facility] up.   Contact information: [address] [address] [phone]              Allergies  Allergen Reactions  . Adhesive [Tape]  Other (See Comments)    "Tape will take off my skin, as will Band-Aids"  . Cyclobenzaprine Other (See Comments)    Hallucinations  . Duloxetine Hcl Other (See Comments)    Makes PTSD- induced nightmares more vivid   . Morphine Itching and Other (See Comments)    Hallucinations, aggression, and makes patient altered also      Consultations:  None   Procedures/Studies: CT Head Wo Contrast  Result Date: 11/03/2020 CLINICAL DATA:  Acute neuro deficit.  Right arm weakness. EXAM: CT HEAD WITHOUT CONTRAST TECHNIQUE: Contiguous axial images were obtained from the base of the skull through the vertex without intravenous contrast. COMPARISON:  CT head 10/24/2020 FINDINGS: Brain: No evidence of acute infarction, hemorrhage, hydrocephalus, extra-axial collection or mass lesion/mass effect. Vascular: Negative for hyperdense vessel Skull: Negative Sinuses/Orbits: Paranasal sinuses clear. Bilateral cataract extraction. Other: None IMPRESSION: Negative CT head Electronically Signed   By: Franchot Gallo M.D.   On: 11/03/2020 14:22   MR BRAIN WO CONTRAST  Result Date: 11/03/2020 CLINICAL DATA:  Right upper extremity numbness EXAM: MRI HEAD WITHOUT CONTRAST MRI CERVICAL SPINE WITHOUT CONTRAST TECHNIQUE: Multiplanar, multiecho pulse sequences of the brain and surrounding structures, and cervical spine, to include the craniocervical junction and cervicothoracic junction, were obtained without intravenous contrast. COMPARISON:  None. FINDINGS: MRI HEAD FINDINGS Brain: No acute infarct, acute hemorrhage or extra-axial collection. Multifocal hyperintense T2-weighted signal within the white matter. Normal volume of CSF spaces. No chronic microhemorrhage. Normal midline structures. Vascular: Normal flow voids. Skull and upper cervical spine: Normal marrow signal. Sinuses/Orbits: Negative. Other: None. MRI CERVICAL SPINE FINDINGS Alignment: Physiologic. Vertebrae: C3-5 ACDF.  No acute abnormality. Cord: Normal  signal and morphology. Posterior Fossa, vertebral arteries, paraspinal tissues: Negative. Disc levels: C1-2: Unremarkable. C2-3: Small disc bulge. There is no spinal canal stenosis. No neural foraminal stenosis. C3-4: ACDF. There is no spinal canal stenosis. Unchanged moderate right and severe left neural foraminal stenosis. C4-5: ACDF. Improved, but still mild spinal canal stenosis. Unchanged severe bilateral neural foraminal stenosis. C5-6: Left subarticular disc bulge. Worsened moderate spinal canal stenosis. Unchanged mild right and severe left neural foraminal stenosis. C6-7:  Small disc bulge. Progression to moderate spinal canal stenosis. Mild right and moderate left neural foraminal stenosis. C7-T1: Normal disc space and facet joints. There is no spinal canal stenosis. No neural foraminal stenosis. IMPRESSION: 1. No acute intracranial abnormality. 2. Multifocal hyperintense T2-weighted signal within the white matter is most commonly due to chronic small vessel ischemia. 3. Worsened moderate C5-6 and C6-7 spinal canal stenosis. 4. Unchanged multilevel moderate-to-severe neural foraminal stenosis. 5. Improved patency of the spinal canal at the C4-5 level following ACDF. Electronically Signed   By: Ulyses Jarred M.D.   On: 11/03/2020 19:53   MR CHEST WO CONTRAST  Result Date: 11/04/2020 CLINICAL DATA:  Right upper extremity weakness EXAM: MRI BRACHIAL PLEXUS WITHOUT CONTRAST TECHNIQUE: Multiplanar, multiecho pulse sequences of the neck and surrounding structures were obtained without intravenous contrast. The field of view was focused on the rightbrachial plexus from the neural foramina to the axilla. COMPARISON:  Cervical spine MRI 11/03/2020.  CT chest 11/22/2019 FINDINGS: Technical note: Examination is significantly degraded by patient motion artifact. Spinal cord: Moderate canal stenosis is present at the C5-6 and C6-7 levels, better characterized on dedicated MRI cervical spine exam 11/03/2020. No  obvious intrinsic cord signal abnormality on large field-of-view imaging. Brachial plexus: Scalene triangle, costoclavicular space, and pectoralis minor space are within normal limits. No evidence of fibrous band or mass lesion. Roots: Unremarkable. Trunks: Unremarkable. Divisions: Unremarkable. Cords: Unremarkable. Distal brachial plexus/branches: Unremarkable. Muscles and tendons Musculature of the shoulder girdle and visualized chest wall appear normal without edema, atrophy, or fatty infiltration to suggest denervation changes. Rotator cuff tendons appear grossly intact. Bones No cervical rib. The right first rib is normal in appearance. Visualized marrow structures are unremarkable. No fracture or marrow replacing lesion. Joints Moderate AC joint arthropathy. Sternoclavicular and glenohumeral joints appear within normal limits. No joint effusion. Other findings Feathery soft tissue interspersed with fat located deep to the bilateral scapulae overlying the posterior chest wall, which is larger on the right (series 2 and series 7, image 38). Area of abnormality on the right measures 4.7 x 1.3 x 2.2 cm (series 6, image 19). IMPRESSION: 1. Motion degraded examination. 2. No appreciable abnormality of the right brachial plexus. 3. Moderate canal stenosis at the C5-6 and C6-7 levels, better characterized on dedicated MRI cervical spine exam 11/03/2020. 4. Incidental note of small bilateral elastofibromas, larger on the right measuring up to 4.7 cm. Electronically Signed   By: Davina Poke D.O.   On: 11/04/2020 08:19   MR Cervical Spine Wo Contrast  Result Date: 11/03/2020 CLINICAL DATA:  Right upper extremity numbness EXAM: MRI HEAD WITHOUT CONTRAST MRI CERVICAL SPINE WITHOUT CONTRAST TECHNIQUE: Multiplanar, multiecho pulse sequences of the brain and surrounding structures, and cervical spine, to include the craniocervical junction and cervicothoracic junction, were obtained without intravenous contrast.  COMPARISON:  None. FINDINGS: MRI HEAD FINDINGS Brain: No acute infarct, acute hemorrhage or extra-axial collection. Multifocal hyperintense T2-weighted signal within the white matter. Normal volume of CSF spaces. No chronic microhemorrhage. Normal midline structures. Vascular: Normal flow voids. Skull and upper cervical spine: Normal marrow signal. Sinuses/Orbits: Negative. Other: None. MRI CERVICAL SPINE FINDINGS Alignment: Physiologic. Vertebrae: C3-5 ACDF.  No acute abnormality. Cord: Normal signal and morphology. Posterior Fossa, vertebral arteries, paraspinal tissues: Negative. Disc levels: C1-2: Unremarkable. C2-3: Small disc bulge. There is no spinal canal stenosis. No neural foraminal stenosis. C3-4: ACDF. There is no spinal canal stenosis. Unchanged moderate right and severe left neural foraminal stenosis. C4-5: ACDF. Improved, but still mild spinal  canal stenosis. Unchanged severe bilateral neural foraminal stenosis. C5-6: Left subarticular disc bulge. Worsened moderate spinal canal stenosis. Unchanged mild right and severe left neural foraminal stenosis. C6-7: Small disc bulge. Progression to moderate spinal canal stenosis. Mild right and moderate left neural foraminal stenosis. C7-T1: Normal disc space and facet joints. There is no spinal canal stenosis. No neural foraminal stenosis. IMPRESSION: 1. No acute intracranial abnormality. 2. Multifocal hyperintense T2-weighted signal within the white matter is most commonly due to chronic small vessel ischemia. 3. Worsened moderate C5-6 and C6-7 spinal canal stenosis. 4. Unchanged multilevel moderate-to-severe neural foraminal stenosis. 5. Improved patency of the spinal canal at the C4-5 level following ACDF. Electronically Signed   By: Ulyses Jarred M.D.   On: 11/03/2020 19:53      Subjective: Patient was seen and examined at bedside.  Overnight events noted.  Patient reports feeling much better and wants to be discharged home.  Home health services  been arranged.  Patient reports swelling and redness has resolved.  Discharge Exam: Vitals:   11/05/20 0543 11/05/20 1206  BP: 127/78 110/69  Pulse: 62 81  Resp: 18 16  Temp: 98 F (36.7 C) 98.4 F (36.9 C)  SpO2: 98% 97%   Vitals:   11/04/20 1643 11/05/20 0000 11/05/20 0543 11/05/20 1206  BP: 109/74 106/78 127/78 110/69  Pulse: 67 67 62 81  Resp: 18 18 18 16   Temp: 98.6 F (37 C) 98.4 F (36.9 C) 98 F (36.7 C) 98.4 F (36.9 C)  TempSrc: Oral Oral Oral Oral  SpO2: 94% 99% 98% 97%  Weight:      Height:        General: Pt is alert, awake, not in acute distress Cardiovascular: RRR, S1/S2 +, no rubs, no gallops Respiratory: CTA bilaterally, no wheezing, no rhonchi Abdominal: Soft, NT, ND, bowel sounds + Extremities: no edema, no cyanosis.  Cellulitis improved    The results of significant diagnostics from this hospitalization (including imaging, microbiology, ancillary and laboratory) are listed below for reference.     Microbiology: Recent Results (from the past 240 hour(s))  Respiratory Panel by RT PCR (Flu A&B, Covid) - Urine, Clean Catch     Status: None   Collection Time: 11/03/20  2:18 PM   Specimen: Urine, Clean Catch; Nasopharyngeal  Result Value Ref Range Status   SARS Coronavirus 2 by RT PCR NEGATIVE NEGATIVE Final    Comment: (NOTE) SARS-CoV-2 target nucleic acids are NOT DETECTED.  The SARS-CoV-2 RNA is generally detectable in upper respiratoy specimens during the acute phase of infection. The lowest concentration of SARS-CoV-2 viral copies this assay can detect is 131 copies/mL. A negative result does not preclude SARS-Cov-2 infection and should not be used as the sole basis for treatment or other patient management decisions. A negative result may occur with  improper specimen collection/handling, submission of specimen other than nasopharyngeal swab, presence of viral mutation(s) within the areas targeted by this assay, and inadequate number of  viral copies (<131 copies/mL). A negative result must be combined with clinical observations, patient history, and epidemiological information. The expected result is Negative.  Fact Sheet for Patients:  PinkCheek.be  Fact Sheet for Healthcare Providers:  GravelBags.it  This test is no t yet approved or cleared by the [place] FDA and  has been authorized for detection and/or diagnosis of SARS-CoV-2 by FDA under an Emergency Use Authorization (EUA). This EUA will remain  in effect (meaning this test can be used) for the duration of the COVID-19  declaration under Section 564(b)(1) of the Act, 21 U.S.C. section 360bbb-3(b)(1), unless the authorization is terminated or revoked sooner.     Influenza A by PCR NEGATIVE NEGATIVE Final   Influenza B by PCR NEGATIVE NEGATIVE Final    Comment: (NOTE) The Xpert Xpress SARS-CoV-2/FLU/RSV assay is intended as an aid in  the diagnosis of influenza from Nasopharyngeal swab specimens and  should not be used as a sole basis for treatment. Nasal washings and  aspirates are unacceptable for Xpert Xpress SARS-CoV-2/FLU/RSV  testing.  Fact Sheet for Patients: PinkCheek.be  Fact Sheet for Healthcare Providers: GravelBags.it  This test is not yet approved or cleared by the [place] FDA and  has been authorized for detection and/or diagnosis of SARS-CoV-2 by  FDA under an Emergency Use Authorization (EUA). This EUA will remain  in effect (meaning this test can be used) for the duration of the  Covid-19 declaration under Section 564(b)(1) of the Act, 21  U.S.C. section 360bbb-3(b)(1), unless the authorization is  terminated or revoked. Performed at [facility], [address] [place], [place] [address]      Labs: BNP (last 3 results) No results for input(s): BNP in the last 8760 hours. Basic Metabolic  Panel: Recent Labs  Lab 11/03/20 1355 11/04/20 0303 11/05/20 0319  NA 135 136 135  K 3.6 3.5 4.2  CL 96* 98 100  CO2 29 27 25   GLUCOSE 148* 147* 185*  BUN 14 13 19   CREATININE 1.01 1.11 1.14  CALCIUM 8.8* 8.8* 8.9   Liver Function Tests: Recent Labs  Lab 11/03/20 1355  AST 40  ALT 45*  ALKPHOS 104  BILITOT 0.9  PROT 7.2  ALBUMIN 3.4*   No results for input(s): LIPASE, AMYLASE in the last 168 hours. No results for input(s): AMMONIA in the last 168 hours. CBC: Recent Labs  Lab 11/03/20 1355 11/04/20 0303 11/05/20 0319  WBC 8.1 7.4 6.9  NEUTROABS 6.0  --   --   HGB 12.9* 13.8 13.0  HCT 39.7 41.9 38.8*  MCV 97.5 97.0 95.1  PLT 146* 147* 142*   Cardiac Enzymes: Recent Labs  Lab 11/03/20 1355  CKTOTAL 40*   BNP: Invalid input(s): POCBNP CBG: Recent Labs  Lab 11/04/20 1129 11/04/20 1607 11/04/20 2125 11/05/20 0549 11/05/20 1102  GLUCAP 174* 239* 146* 160* 193*   D-Dimer No results for input(s): DDIMER in the last 72 hours. Hgb A1c No results for input(s): HGBA1C in the last 72 hours. Lipid Profile No results for input(s): CHOL, HDL, LDLCALC, TRIG, CHOLHDL, LDLDIRECT in the last 72 hours. Thyroid function studies No results for input(s): TSH, T4TOTAL, T3FREE, THYROIDAB in the last 72 hours.  Invalid input(s): FREET3 Anemia work up No results for input(s): VITAMINB12, FOLATE, FERRITIN, TIBC, IRON, RETICCTPCT in the last 72 hours. Urinalysis    Component Value Date/Time   COLORURINE YELLOW 11/03/2020 [place] 11/03/2020 1418   LABSPEC 1.020 11/03/2020 1418   PHURINE 7.0 11/03/2020 1418   GLUCOSEU NEGATIVE 11/03/2020 1418   HGBUR TRACE (A) 11/03/2020 1418   BILIRUBINUR NEGATIVE 11/03/2020 1418   [place] 11/03/2020 1418   PROTEINUR 100 (A) 11/03/2020 1418   NITRITE NEGATIVE 11/03/2020 1418   LEUKOCYTESUR NEGATIVE 11/03/2020 1418   Sepsis Labs Invalid input(s): PROCALCITONIN,  WBC,  LACTICIDVEN Microbiology Recent  Results (from the past 240 hour(s))  Respiratory Panel by RT PCR (Flu A&B, Covid) - Urine, Clean Catch     Status: None   Collection Time:  11/03/20  2:18 PM   Specimen: Urine, Clean Catch; Nasopharyngeal  Result Value Ref Range Status   SARS Coronavirus 2 by RT PCR NEGATIVE NEGATIVE Final    Comment: (NOTE) SARS-CoV-2 target nucleic acids are NOT DETECTED.  The SARS-CoV-2 RNA is generally detectable in upper respiratoy specimens during the acute phase of infection. The lowest concentration of SARS-CoV-2 viral copies this assay can detect is 131 copies/mL. A negative result does not preclude SARS-Cov-2 infection and should not be used as the sole basis for treatment or other patient management decisions. A negative result may occur with  improper specimen collection/handling, submission of specimen other than nasopharyngeal swab, presence of viral mutation(s) within the areas targeted by this assay, and inadequate number of viral copies (<131 copies/mL). A negative result must be combined with clinical observations, patient history, and epidemiological information. The expected result is Negative.  Fact Sheet for Patients:  PinkCheek.be  Fact Sheet for Healthcare Providers:  GravelBags.it  This test is no t yet approved or cleared by the [place] FDA and  has been authorized for detection and/or diagnosis of SARS-CoV-2 by FDA under an Emergency Use Authorization (EUA). This EUA will remain  in effect (meaning this test can be used) for the duration of the COVID-19 declaration under Section 564(b)(1) of the Act, 21 U.S.C. section 360bbb-3(b)(1), unless the authorization is terminated or revoked sooner.     Influenza A by PCR NEGATIVE NEGATIVE Final   Influenza B by PCR NEGATIVE NEGATIVE Final    Comment: (NOTE) The Xpert Xpress SARS-CoV-2/FLU/RSV assay is intended as an aid in  the diagnosis of influenza from  Nasopharyngeal swab specimens and  should not be used as a sole basis for treatment. Nasal washings and  aspirates are unacceptable for Xpert Xpress SARS-CoV-2/FLU/RSV  testing.  Fact Sheet for Patients: PinkCheek.be  Fact Sheet for Healthcare Providers: GravelBags.it  This test is not yet approved or cleared by the [place] FDA and  has been authorized for detection and/or diagnosis of SARS-CoV-2 by  FDA under an Emergency Use Authorization (EUA). This EUA will remain  in effect (meaning this test can be used) for the duration of the  Covid-19 declaration under Section 564(b)(1) of the Act, 21  U.S.C. section 360bbb-3(b)(1), unless the authorization is  terminated or revoked. Performed at [facility], [address] [place], [place] [address]      Time coordinating discharge: Over 30 minutes  SIGNED:   Shawna Clamp, MD  [facility] 11/05/2020, 4:00 PM Pager   If 7PM-7AM, please contact night-coverage [email or web address]

## 2020-11-06 ENCOUNTER — Telehealth: Payer: Self-pay | Admitting: Neurology

## 2020-11-06 NOTE — Telephone Encounter (Signed)
Patient called and said he needs to schedule follow up from a visit to the ED yesterday for "right sided weakness." Routing to clinical staff for review and to advise on scheduling.

## 2020-11-07 NOTE — Telephone Encounter (Signed)
Pt states the weakness started in his arm and now the leg of the right side.    Per pt he his told by his PCP that something is wrong with his brain. Per pt  DR. Dwyane Dee advised pt he shouldn't be walking around with his right sided weakness.   Advised pt that the CT of the head  was negative.  Pt advised he was worked in for next month and not in May 2022. We will try and place a hold on a EMG slot with Dr. Posey Pronto.  Will send Care navigators phone number to see if they could help with through a my chart message.  [facility] desk staff held a slot for the EMG for 12/23/20.     Pt states the weakness his right side is getting better, but he is still dizzy.  He was suppose to get a referral for vestibular rehab to help. But that hasn't happened. He is unable to move his wrist, but he can move his shoulder.

## 2020-11-07 NOTE — Telephone Encounter (Signed)
4 weeks is fine.  He will need a nerve conduction study and his cellulitis will need to be cleared before we can perform that.

## 2020-11-07 NOTE — Telephone Encounter (Signed)
Patient is very frustrated that he is not getting a phone call back letting him know what is going on with him and that he has to wait until 12/10/20 to see or talk to Dr Tomi Likens. Patient is aware that he is being worked in with Dr Tomi Likens, but felt like that was too far out. Added appointment to waitlist. He expressed that he wasn't given any information and would like to know what is going on, if he will get better, and what to expect. Please call.

## 2020-11-07 NOTE — Telephone Encounter (Signed)
Called patient and left a message with an appointment with Dr. Tomi Likens date and time of: 12/10/20 at 11:10 AM. Sent a reminder in the mail as well.

## 2020-11-19 ENCOUNTER — Telehealth: Payer: Self-pay | Admitting: Neurology

## 2020-11-19 NOTE — Telephone Encounter (Signed)
Pt called no answer unable to leave a voice mail. Will try to call again later

## 2020-11-19 NOTE — Telephone Encounter (Signed)
Patient is requesting a call back. He'd like to know whether Dr. Tomi Likens wants him to have an EMG prior to his follow up appointment on 12/10/20.  Patient expressed, "I'm home now but still having problems with dizziness. I've also accepted that I need more help taking care of myself. I think I may need some in-patient rehab. I'd like Dr. Tomi Likens to help coordinate with Dr. Tonette Lederer, my PCP from [facility]. I'm not doing too good right now and I need help with this."

## 2020-11-28 NOTE — Telephone Encounter (Signed)
Has this been completed?  Sending to clinical staff for review: Okay to sign/close encounter or is further follow up needed? 

## 2020-12-02 ENCOUNTER — Other Ambulatory Visit: Payer: Self-pay | Admitting: Internal Medicine

## 2020-12-04 IMAGING — MR MR CHEST MEDIASTINUM W/O CM
16 series · 16 of 16 positions shown · non-contrast
Comparison: Cervical spine MRI 11/03/2020.  CT chest 11/22/2019

CLINICAL DATA: Right upper extremity weakness

EXAM:
MRI BRACHIAL PLEXUS WITHOUT CONTRAST
TECHNIQUE: Multiplanar, multiecho pulse sequences of the neck and surrounding
structures were obtained without intravenous contrast. The field of
view was focused on the rightbrachial plexus from the neural
foramina to the axilla.

[Series 2: T1 · axial · 3.0mm · 0.66mm/px · 1 of 53 slices shown (1 of 3)]
[im 1/53]
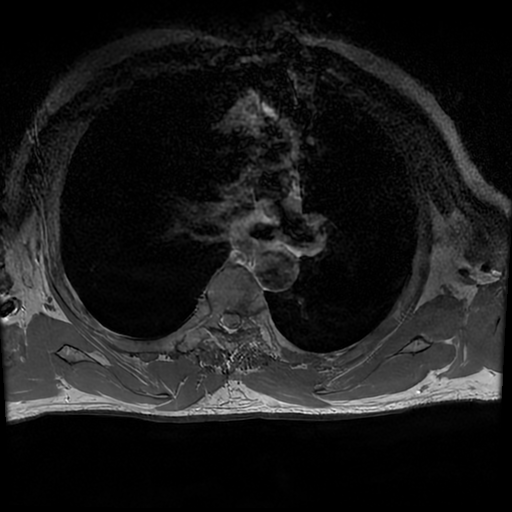

[Series 3: T2 · axial · 3.0mm · 0.66mm/px · 1 of 53 slices shown (1 of 12)]
[im 1/53]
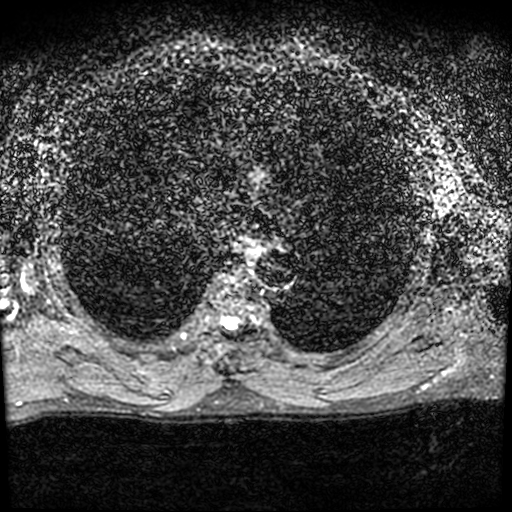

[Series 5: STIR · coronal · 3.0mm · 0.43mm/px · 1 of 38 slices shown]
[im 1/38]
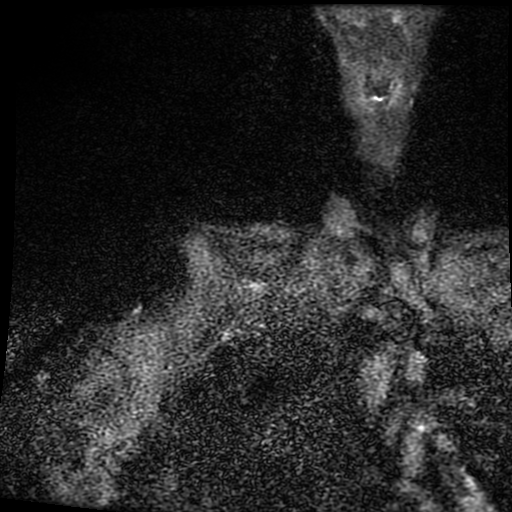

[Series 6: T2 · sagittal · 4.0mm · 0.43mm/px · 1 of 42 slices shown (2 of 12)]
[im 1/42]
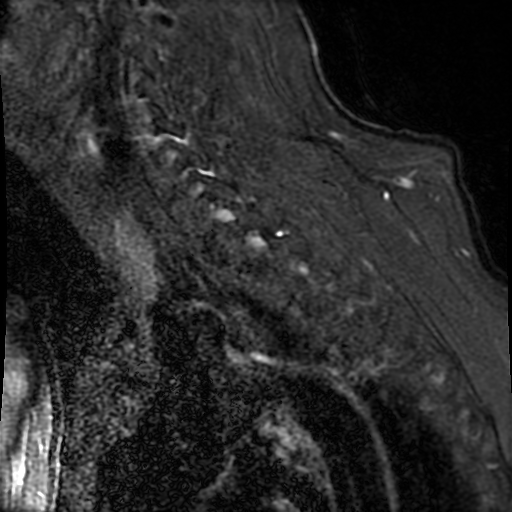

[Series 7: T2 · axial · 3.0mm · 0.66mm/px · 1 of 53 slices shown (3 of 12)]
[im 1/53]
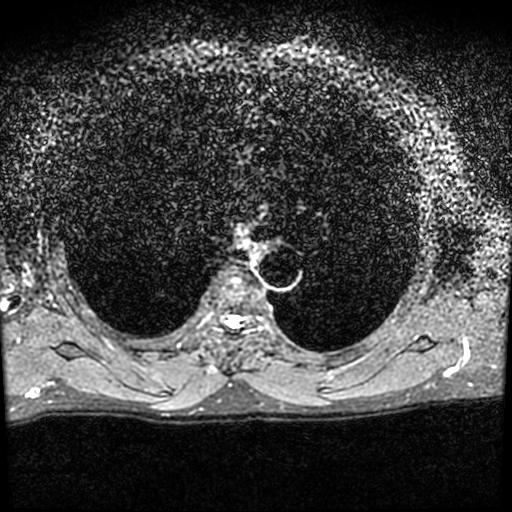

[Series 8: T1 · coronal · 3.0mm · 0.43mm/px · 1 of 38 slices shown (2 of 3)]
[im 1/38]
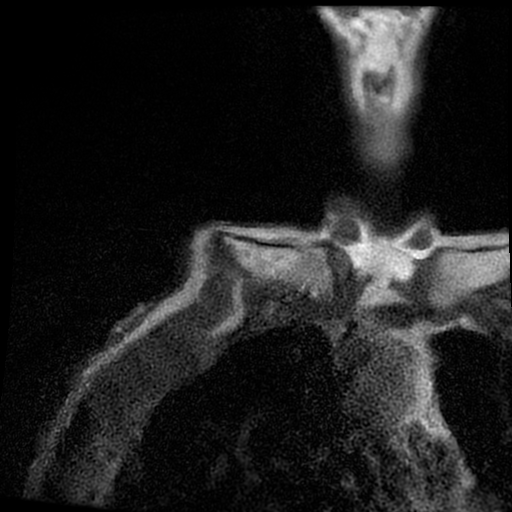

[Series 9: T1 · sagittal · 4.0mm · 0.43mm/px · 1 of 42 slices shown (3 of 3)]
[im 1/42]
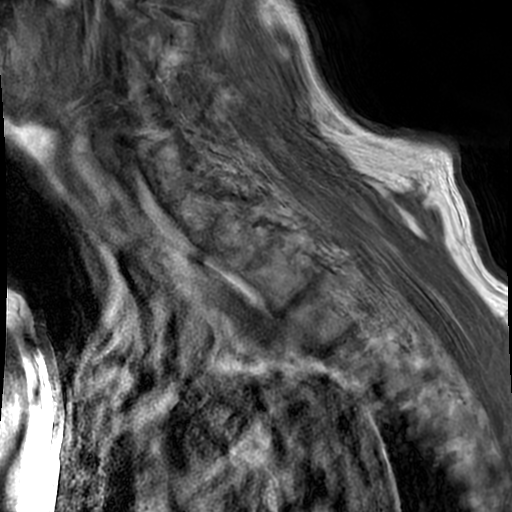

[Series 300: T2 · axial · 3.0mm · 0.66mm/px · 1 of 53 slices shown (4 of 12)]
[im 1/53]
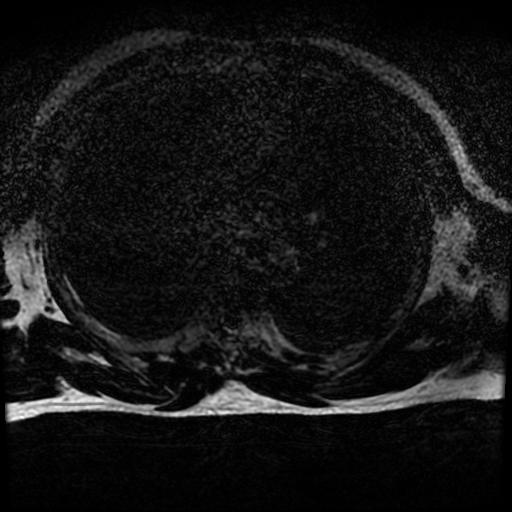

[Series 301: T2 · axial · 3.0mm · 0.66mm/px · 1 of 53 slices shown (5 of 12)]
[im 1/53]
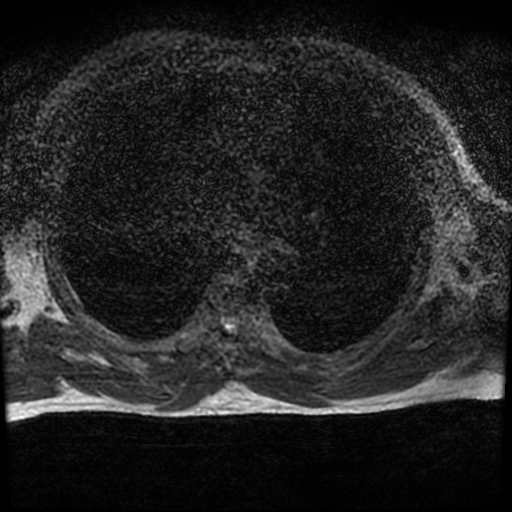

[Series 302: T2 · axial · 3.0mm · 0.66mm/px · 1 of 53 slices shown (6 of 12)]
[im 1/53]
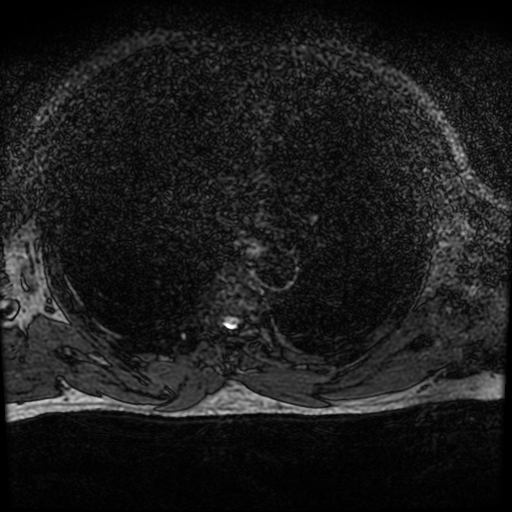

[Series 600: T2 · sagittal · 4.0mm · 0.43mm/px · 1 of 41 slices shown (7 of 12)]
[im 1/41]
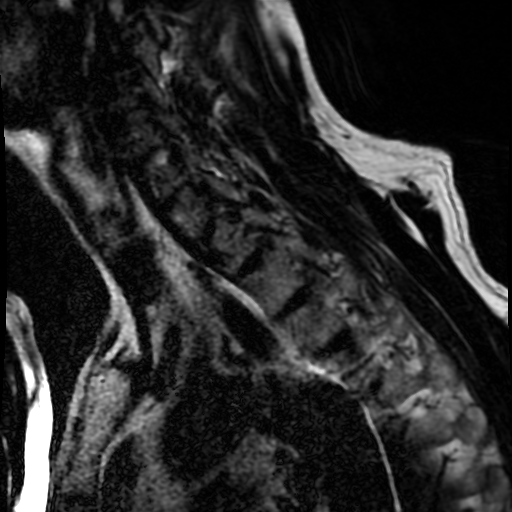

[Series 601: T2 · sagittal · 4.0mm · 0.43mm/px · 1 of 42 slices shown (8 of 12)]
[im 1/42]
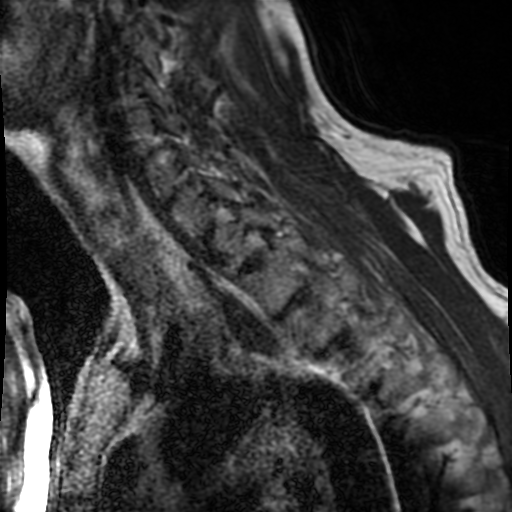

[Series 602: T2 · sagittal · 4.0mm · 0.43mm/px · 1 of 42 slices shown (9 of 12)]
[im 1/42]
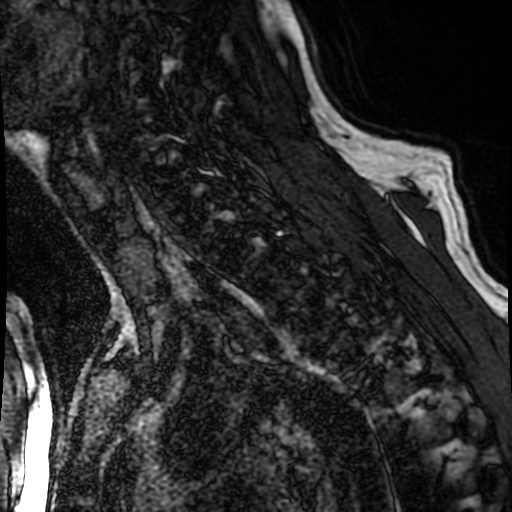

[Series 700: T2 · axial · 3.0mm · 0.66mm/px · 1 of 53 slices shown (10 of 12)]
[im 1/53]
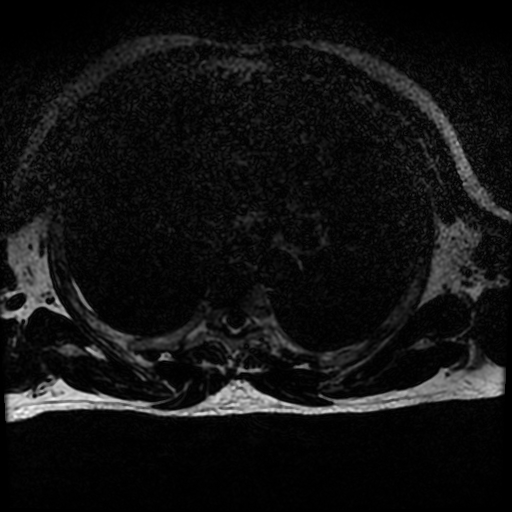

[Series 701: T2 · axial · 3.0mm · 0.66mm/px · 1 of 53 slices shown (11 of 12)]
[im 1/53]
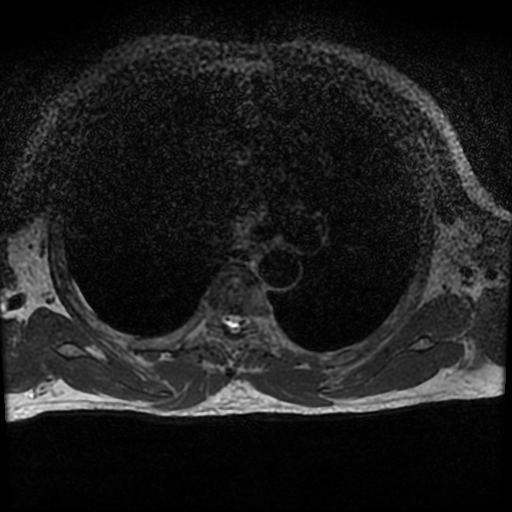

[Series 702: T2 · axial · 3.0mm · 0.66mm/px · 1 of 53 slices shown (12 of 12)]
[im 1/53]
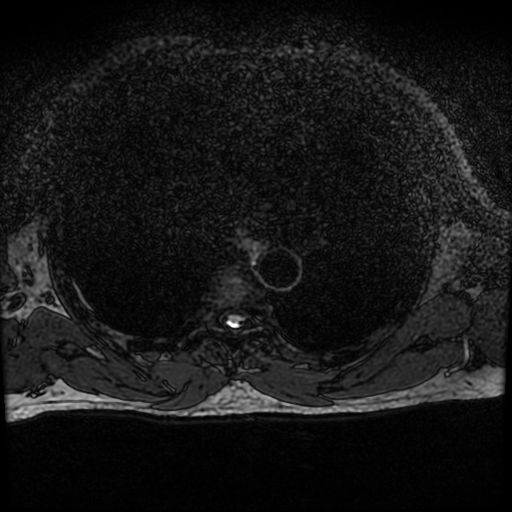

[16 of 16 positions shown; findings below may reference images not displayed]

FINDINGS: Technical note: Examination is significantly degraded by patient
motion artifact.

Spinal cord:

Moderate canal stenosis is present at the C5-6 and C6-7 levels,
better characterized on dedicated MRI cervical spine exam
11/03/2020. No obvious intrinsic cord signal abnormality on large
field-of-view imaging.

Brachial plexus:

Scalene triangle, costoclavicular space, and pectoralis minor space
are within normal limits. No evidence of fibrous band or mass
lesion.

Roots: Unremarkable.

Trunks: Unremarkable.

Divisions: Unremarkable.

Cords: Unremarkable.

Distal brachial plexus/branches: Unremarkable.

Muscles and tendons

Musculature of the shoulder girdle and visualized chest wall appear
normal without edema, atrophy, or fatty infiltration to suggest
denervation changes. Rotator cuff tendons appear grossly intact.

Bones

No cervical rib. The right first rib is normal in appearance.
Visualized marrow structures are unremarkable. No fracture or marrow
replacing lesion.

Joints

Moderate AC joint arthropathy. Sternoclavicular and glenohumeral
joints appear within normal limits. No joint effusion.

Other findings

Feathery soft tissue interspersed with fat located deep to the
bilateral scapulae overlying the posterior chest wall, which is
larger on the right (series 2 and series 7, image 38). Area of
abnormality on the right measures 4.7 x 1.3 x 2.2 cm (series 6,
image 19).
IMPRESSION: 1. Motion degraded examination.
2. No appreciable abnormality of the right brachial plexus.
3. Moderate canal stenosis at the C5-6 and C6-7 levels, better
characterized on dedicated MRI cervical spine exam 11/03/2020.
[DATE]. Incidental note of small bilateral elastofibromas, larger on the
right measuring up to 4.7 cm.

## 2020-12-09 ENCOUNTER — Telehealth: Payer: Self-pay | Admitting: Cardiology

## 2020-12-09 NOTE — Telephone Encounter (Signed)
Error: Dr Margaretann Loveless patient

## 2020-12-09 NOTE — Telephone Encounter (Signed)
New Message   *STAT* If patient is at the pharmacy, call can be transferred to refill team.   1. Which medications need to be refilled? (please list name of each medication and dose if known) isosorbide mononitrate (IMDUR) 60 MG 24 hr tablet   2. Which pharmacy/location (including street and city if local pharmacy) is medication to be sent to? [place], [place]  3. Do they need a 30 day or 90 day supply? [place]
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
## 2020-12-10 ENCOUNTER — Other Ambulatory Visit: Payer: Self-pay

## 2020-12-10 ENCOUNTER — Encounter: Payer: Self-pay | Admitting: Neurology

## 2020-12-10 ENCOUNTER — Telehealth (INDEPENDENT_AMBULATORY_CARE_PROVIDER_SITE_OTHER): Payer: Medicare Other | Admitting: Neurology

## 2020-12-10 VITALS — Ht 72.0 in | Wt 160.0 lb

## 2020-12-10 DIAGNOSIS — I25110 Atherosclerotic heart disease of native coronary artery with unstable angina pectoris: Secondary | ICD-10-CM

## 2020-12-10 DIAGNOSIS — R29898 Other symptoms and signs involving the musculoskeletal system: Secondary | ICD-10-CM

## 2020-12-17 ENCOUNTER — Other Ambulatory Visit: Payer: Self-pay

## 2020-12-17 DIAGNOSIS — R202 Paresthesia of skin: Secondary | ICD-10-CM

## 2020-12-22 ENCOUNTER — Telehealth: Payer: Self-pay | Admitting: Neurology

## 2020-12-22 NOTE — Telephone Encounter (Signed)
If the left arm symptoms just started last week, we need to wait 3 weeks to perform testing for more accurate results.
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
## 2020-12-23 ENCOUNTER — Ambulatory Visit (INDEPENDENT_AMBULATORY_CARE_PROVIDER_SITE_OTHER): Payer: Medicare Other | Admitting: Neurology

## 2020-12-23 ENCOUNTER — Other Ambulatory Visit: Payer: Self-pay

## 2020-12-23 DIAGNOSIS — R202 Paresthesia of skin: Secondary | ICD-10-CM | POA: Diagnosis not present

## 2020-12-23 DIAGNOSIS — G5631 Lesion of radial nerve, right upper limb: Secondary | ICD-10-CM

## 2020-12-23 DIAGNOSIS — G5793 Unspecified mononeuropathy of bilateral lower limbs: Secondary | ICD-10-CM

## 2020-12-23 NOTE — Procedures (Signed)
[facility]  [address] [place], [address]  [place], [place] [address] Tel: [phone] Fax:  [phone] Test Date:  12/23/2020  Patient: Brian Malone DOB: [birth year] Physician: Nita Sickle, DO  Sex: Male Height: 6' " Ref Phys: Shon Millet, D.O.  ID#: [id number]   Technician:    Patient Complaints: This is a 70 year old man referred for evaluation of right wrist drop and weakness.  NCV & EMG Findings: Extensive electrodiagnostic testing of the right upper and lower extremities shows: 1. Right median sensory responses within normal limits.  Right ulnar sensory response shows prolonged latency (3.8 ms).  Right radial sensory response shows reduced amplitude (R9.7 V), as compared with the left.  Right sural and superficial peroneal sensory responses are absent. 2. Right median motor response is within normal limits.  Right ulnar motor response shows slowed conduction velocity across the elbow (A Elbow-B Elbow, 43 m/s).  Right radial motor response shows markedly reduced amplitude (1.1 mV), without conduction block or temporal dispersion.  In the lower extremity, the peroneal motor response is absent at the extensor digitorum brevis, and normal at the tibialis anterior.  The right tibial motor response also shows reduced amplitude (3.4 mV). 3. Right tibial H reflex study is within normal limits. 4. In the right upper extremity, severe active motor axonal loss changes are seen involving the extensor indicis proprius, extensor digitorum communis, and brachioradialis muscles, where there is no motor unit recruitment in the latter muscles. 5. In the right lower extremity, chronic motor axonal loss changes are seen affecting the muscles below the knee, without accompanying active denervation.  Impression: 1. 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 2. Right ulnar neuropathy with slowing across the elbow, purely demyelinating, mild. 3. 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   ___________________________ Nita Sickle, DO    Nerve Conduction Studies Anti Sensory Summary Table   Stim Site NR Peak (ms) Norm Peak (ms) P-T Amp (V) Norm P-T Amp  Right Median Anti Sensory (2nd Digit)  32C  Wrist    3.3 <3.8 15.1 >10  Left Radial Anti Sensory (Base 1st Digit)  32C  Wrist    2.3 <2.8 23.6 >10  Right Radial Anti Sensory (Base 1st Digit)  32C  Wrist    2.7 <2.8 9.7 >10  Right Sup Peroneal Anti Sensory (Ant Lat Mall)  32C  12 cm NR  <4.6  >3  Right Sural Anti Sensory (Lat Mall)  32C  Calf NR  <4.6  >3  Right Ulnar Anti Sensory (5th Digit)  32C  Wrist    3.8 <3.2 12.4 >5   Motor Summary Table   Stim Site NR Onset (ms) Norm Onset (ms) O-P Amp (mV) Norm O-P Amp Site1 Site2 Delta-0 (ms) Dist (cm) Vel (m/s) Norm Vel (m/s)  Right Median Motor (Abd Poll Brev)  32C  Wrist    3.8 <4.0 7.8 >5 Elbow Wrist 4.8 28.0 58 >50  Elbow    8.6  7.5         Right Peroneal Motor (Ext Dig Brev)  32C  Ankle NR  <6.0  >2.5 B Fib Ankle  0.0  >40  B Fib NR     Poplt B Fib  0.0  >40  Poplt NR            Right Peroneal TA Motor (Tib Ant)  32C  Fib Head    3.1 <4.5 4.3 >3 Poplit Fib Head 1.7 8.0  47 >40  Poplit    4.8  4.3         Right Radial Motor (Ext Ind Prop)  32C  7cm    2.5 <3.1 1.1 >5 Up Arm 7cm 2.0 11.0 55 >50  Up Arm    4.5  1.0  B-Spiral Groove Up Arm 2.3 11.0 48   B-Spiral Groove    6.8  1.0  A-Spiral Groove B-Spiral Groove 0.4 2.0 50   A-Spiral Groove    7.2  0.8         Right Tibial Motor (Abd Hall Brev)  32C  Ankle    4.8 <6.0 3.4 >4 Knee Ankle 10.7 45.0 42 >40  Knee    15.5  2.6         Right Ulnar Motor (Abd Dig Minimi)  32C  Wrist    2.7 <3.1 7.3 >7 B Elbow Wrist 4.1 22.0 54 >50  B Elbow    6.8  6.0  A Elbow B Elbow 2.3 10.0 43 >50  A Elbow    9.1  5.6          H Reflex Studies   NR H-Lat (ms) Lat Norm (ms) L-R H-Lat (ms)  Right Tibial (Gastroc)  32C     34.97 <35     EMG   Side Muscle Ins Act Fibs Psw Fasc Number Recrt Dur Dur. Amp Amp. Poly Poly. Comment  Right AntTibialis Nml Nml Nml Nml 1- Rapid Few 1+ Few 1+ Nml Nml N/A  Right Gastroc Nml Nml Nml Nml 1- Rapid Few 1+ Few 1+ Nml Nml N/A  Right FlexPolLong Nml Nml Nml Nml Nml Nml Nml Nml Nml Nml Nml Nml N/A  Right Abd Poll Brev Nml Nml Nml Nml Nml Nml Nml Nml Nml Nml Nml Nml N/A  Right PronatorTeres Nml Nml Nml Nml Nml Nml Nml Nml Nml Nml Nml Nml N/A  Right Biceps Nml Nml Nml Nml Nml Nml Nml Nml Nml Nml Nml Nml N/A  Right Triceps Nml Nml Nml Nml Nml Nml Nml Nml Nml Nml Nml Nml N/A  Right Deltoid Nml Nml Nml Nml Nml Nml Nml Nml Nml Nml Nml Nml N/A  Right BicepsFemS Nml Nml Nml Nml Nml Nml Nml Nml Nml Nml Nml Nml N/A  Right Anconeus Nml Nml Nml Nml Nml Nml Nml Nml Nml Nml Nml Nml N/A  Right Flex Dig Long Nml Nml Nml Nml 1- Rapid Few 1+ Few 1+ Nml Nml N/A  Right RectFemoris Nml Nml Nml Nml Nml Nml Nml Nml Nml Nml Nml Nml N/A  Right GluteusMed Nml Nml Nml Nml Nml Nml Nml Nml Nml Nml Nml Nml N/A  Right 1stDorInt Nml Nml Nml Nml 1- Rapid Few 1+ Few 1+ Nml Nml N/A  Right Ext Indicis Nml 2+ Nml Nml SMU Rapid All 1+ All 1+ All 1+ N/A  Right BrachioRad Nml 1+ Nml Nml NE None - - - - - - N/A  Right Ext Digitorum Nml 2+ Nml Nml NE None - - - - - - N/A      Waveforms:

## 2020-12-24 NOTE — Progress Notes (Signed)
Pt advised of his EMG results

## 2020-12-24 NOTE — Telephone Encounter (Signed)
VM left on 12/22/20 for pt to call back.

## 2020-12-29 ENCOUNTER — Encounter: Payer: Self-pay | Admitting: Neurology

## 2020-12-29 NOTE — Progress Notes (Signed)
Brian Malone (Key: Carolynn Comment) Aimovig 70MG /ML auto-injectors   Form [employer] Form Created 10 hours ago Sent to Plan 9 minutes ago Plan Response 9 minutes ago Submit Clinical Questions 8 minutes ago Determination Favorable 7 minutes ago Message from Plan PA Case: [id number], Status: Approved, Coverage Starts on: 12/20/2020 12:00:00 AM, Coverage Ends on: 12/19/2021 12:00:00 AM. Questions? Contact [phone].
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
## 2021-01-22 ENCOUNTER — Ambulatory Visit (INDEPENDENT_AMBULATORY_CARE_PROVIDER_SITE_OTHER): Payer: Medicare Other | Admitting: Neurology

## 2021-01-22 ENCOUNTER — Encounter: Payer: Self-pay | Admitting: Neurology

## 2021-01-22 ENCOUNTER — Other Ambulatory Visit: Payer: Self-pay

## 2021-01-22 VITALS — BP 114/71 | HR 97 | Ht 72.0 in | Wt 160.0 lb

## 2021-01-22 DIAGNOSIS — G5631 Lesion of radial nerve, right upper limb: Secondary | ICD-10-CM | POA: Diagnosis not present

## 2021-01-22 DIAGNOSIS — G43109 Migraine with aura, not intractable, without status migrainosus: Secondary | ICD-10-CM | POA: Diagnosis not present

## 2021-01-22 NOTE — Patient Instructions (Signed)
1.  Continue Aimovig and rizatriptan 2.  Follow up with hand specialist.  Continue physical therapy 3.  Follow up 6 months

## 2021-03-09 ENCOUNTER — Encounter (HOSPITAL_BASED_OUTPATIENT_CLINIC_OR_DEPARTMENT_OTHER): Payer: Self-pay

## 2021-03-09 ENCOUNTER — Emergency Department (HOSPITAL_BASED_OUTPATIENT_CLINIC_OR_DEPARTMENT_OTHER): Payer: Medicare Other

## 2021-03-09 ENCOUNTER — Other Ambulatory Visit (HOSPITAL_COMMUNITY): Payer: Self-pay | Admitting: Physician Assistant

## 2021-03-09 ENCOUNTER — Other Ambulatory Visit: Payer: Self-pay

## 2021-03-09 ENCOUNTER — Emergency Department (HOSPITAL_BASED_OUTPATIENT_CLINIC_OR_DEPARTMENT_OTHER)
Admission: EM | Admit: 2021-03-09 | Discharge: 2021-03-09 | Disposition: A | Discharge status: Home / Self Care | Payer: Medicare Other | Attending: Emergency Medicine | Admitting: Emergency Medicine

## 2021-03-09 DIAGNOSIS — Z8616 Personal history of COVID-19: Secondary | ICD-10-CM | POA: Diagnosis not present

## 2021-03-09 DIAGNOSIS — I129 Hypertensive chronic kidney disease with stage 1 through stage 4 chronic kidney disease, or unspecified chronic kidney disease: Secondary | ICD-10-CM | POA: Insufficient documentation

## 2021-03-09 DIAGNOSIS — E1122 Type 2 diabetes mellitus with diabetic chronic kidney disease: Secondary | ICD-10-CM | POA: Diagnosis not present

## 2021-03-09 DIAGNOSIS — S20211A Contusion of right front wall of thorax, initial encounter: Secondary | ICD-10-CM | POA: Insufficient documentation

## 2021-03-09 DIAGNOSIS — S299XXA Unspecified injury of thorax, initial encounter: Secondary | ICD-10-CM | POA: Diagnosis present

## 2021-03-09 DIAGNOSIS — S2241XA Multiple fractures of ribs, right side, initial encounter for closed fracture: Secondary | ICD-10-CM | POA: Diagnosis not present

## 2021-03-09 DIAGNOSIS — E1140 Type 2 diabetes mellitus with diabetic neuropathy, unspecified: Secondary | ICD-10-CM | POA: Insufficient documentation

## 2021-03-09 DIAGNOSIS — W1839XA Other fall on same level, initial encounter: Secondary | ICD-10-CM | POA: Insufficient documentation

## 2021-03-09 DIAGNOSIS — Z79899 Other long term (current) drug therapy: Secondary | ICD-10-CM | POA: Diagnosis not present

## 2021-03-09 DIAGNOSIS — N183 Chronic kidney disease, stage 3 unspecified: Secondary | ICD-10-CM | POA: Insufficient documentation

## 2021-03-09 DIAGNOSIS — Z21 Asymptomatic human immunodeficiency virus [HIV] infection status: Secondary | ICD-10-CM | POA: Insufficient documentation

## 2021-03-09 DIAGNOSIS — I2510 Atherosclerotic heart disease of native coronary artery without angina pectoris: Secondary | ICD-10-CM | POA: Insufficient documentation

## 2021-03-09 LAB — COMPREHENSIVE METABOLIC PANEL
ALT: 8 U/L (ref 0–44)
AST: 11 U/L — ABNORMAL LOW (ref 15–41)
Albumin: 2.9 g/dL — ABNORMAL LOW (ref 3.5–5.0)
Alkaline Phosphatase: 95 U/L (ref 38–126)
Anion gap: 9 (ref 5–15)
BUN: 18 mg/dL (ref 8–23)
CO2: 28 mmol/L (ref 22–32)
Calcium: 8.4 mg/dL — ABNORMAL LOW (ref 8.9–10.3)
Chloride: 93 mmol/L — ABNORMAL LOW (ref 98–111)
Creatinine, Ser: 1.03 mg/dL (ref 0.61–1.24)
GFR, Estimated: >60 mL/min (ref >60)
Glucose, Bld: 283 mg/dL — ABNORMAL HIGH (ref 70–99)
Potassium: 3.7 mmol/L (ref 3.5–5.1)
Sodium: 130 mmol/L — ABNORMAL LOW (ref 135–145)
Total Bilirubin: 0.4 mg/dL (ref 0.3–1.2)
Total Protein: 6.7 g/dL (ref 6.5–8.1)

## 2021-03-09 LAB — CBC
HCT: 35.2 % — ABNORMAL LOW (ref 39.0–52.0)
Hemoglobin: 11.7 g/dL — ABNORMAL LOW (ref 13.0–17.0)
MCH: 32.7 pg (ref 26.0–34.0)
MCHC: 33.2 g/dL (ref 30.0–36.0)
MCV: 98.3 fL (ref 80.0–100.0)
Platelets: 231 10*3/uL (ref 150–400)
RBC: 3.58 MIL/uL — ABNORMAL LOW (ref 4.22–5.81)
RDW: 12.2 % (ref 11.5–15.5)
WBC: 13.1 10*3/uL — ABNORMAL HIGH (ref 4.0–10.5)
nRBC: 0 % (ref 0.0–0.2)

## 2021-03-09 LAB — TROPONIN I (HIGH SENSITIVITY): Troponin I (High Sensitivity): 10 ng/L (ref <18)

## 2021-03-09 MED ORDER — SODIUM CHLORIDE 0.9 % IV BOLUS
1000.0000 mL | Freq: Once | INTRAVENOUS | Status: AC
  Administered 2021-03-09: 1000 mL via INTRAVENOUS

## 2021-03-09 MED ORDER — FENTANYL CITRATE (PF) 100 MCG/2ML IJ SOLN
50.0000 ug | Freq: Once | INTRAMUSCULAR | Status: AC
  Administered 2021-03-09: 50 ug via INTRAVENOUS
  Filled 2021-03-09: qty 2

## 2021-03-09 MED ORDER — OXYCODONE-ACETAMINOPHEN 5-325 MG PO TABS: 1.0000 | ORAL_TABLET | ORAL | 0 refills | Status: DC | PRN

## 2021-03-09 MED ORDER — HYDROMORPHONE HCL 1 MG/ML IJ SOLN
1.0000 mg | Freq: Once | INTRAMUSCULAR | Status: AC
  Administered 2021-03-09: 1 mg via INTRAVENOUS
  Filled 2021-03-09: qty 1

## 2021-03-09 MED ORDER — IOHEXOL 300 MG/ML  SOLN
100.0000 mL | Freq: Once | INTRAMUSCULAR | Status: AC | PRN
  Administered 2021-03-09: 80 mL via INTRAVENOUS

## 2021-03-09 MED ORDER — OXYCODONE HCL 5 MG PO TABS
5.0000 mg | ORAL_TABLET | ORAL | 0 refills | Status: AC | PRN
Start: 2021-03-09 — End: 2021-03-12

## 2021-03-09 MED FILL — oxyCODONE HCL 5 MG TABS: 5 | 3 days supply | Qty: 18 | Fill #0
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
## 2021-03-09 NOTE — ED Provider Notes (Signed)
[name] Provider Note   CSN: [id number] Arrival date & time: 03/09/21  1037     History Chief Complaint  Patient presents with  . Chest Pain    Brian Malone is a 70 y.o. male.  HPI 70 year old male with a history of DM type II, HIV, hypertension presents to the ER with complaints of right-sided rib pain.  Patient states that he fell approximately 3 weeks ago, was seen at [facility].  He was told that he has 3 rib fractures, and is currently seeing pain medicine for pain management.  He states he started to have some swelling in his right rib cage, and has had a large hematoma over his right pec which has gotten smaller in size.  He states that he was put on some hydrocodone and [place] by his pain doctor, but states that the [facility] will take several weeks to kick in.  He notes that he has been having difficulty managing the pain.  He has some pain with deep inspiration and movement.  No other falls or reinjury is known.  He also complains of some sternal chest wall pain as well.    Past Medical History:  Diagnosis Date  . Anxiety   . Diabetes mellitus without complication ([place])   . HIV (human immunodeficiency virus infection) ([place])   . Hypertension   . Renal disorder     Patient Active Problem List   Diagnosis Date Noted  . Malnutrition of moderate degree 11/05/2020  . Cellulitis of right upper extremity 11/04/2020  . Weakness of right upper extremity 11/04/2020  . Abscess 11/04/2020  . Hypotension 05/26/2020  . CRI (chronic renal insufficiency), stage 3 (moderate) ([place]) 05/26/2020  . CAD (coronary artery disease) 05/26/2020  . History of COVID-19 05/26/2020  . Dizziness 12/26/2019  . Chest pain 11/22/2019  . Acute subdural hematoma ([place]) 10/28/2019  . Subdural hematoma ([place]) 10/26/2019  . Dysphagia 06/20/2019  . AKI (acute kidney injury) ([place]) 06/20/2019  . HCAP (healthcare-associated pneumonia) 06/20/2019  .  Normal anion gap metabolic acidosis 40/98/1191  . ARF (acute renal failure) ([place]) 06/19/2019  . Cervical spinal stenosis 05/18/2019  . Lumbar spondylosis 05/18/2019  . Sacroiliitis ([place]) 05/15/2019  . Right foot ulcer ([place]) 05/13/2019  . HIV (human immunodeficiency virus infection) ([place]) 05/13/2019  . Tachycardia 04/16/2019  . Diabetes ([place]) 04/16/2019  . Cellulitis 04/16/2019  . Wound infection after surgery 04/15/2019  . Benign prostatic hyperplasia (BPH) with straining on urination 02/22/2019  . Essential hypertension 02/22/2019  . Post-traumatic osteoarthritis of both ankles 02/22/2019  . Trigeminal neuralgia 02/22/2019  . Uncontrolled type 2 diabetes mellitus with hyperglycemia ([place]) 02/22/2019  . Chronic bilateral low back pain with bilateral sciatica 01/19/2019  . Diabetic autonomic neuropathy associated with type 2 diabetes mellitus ([place]) 01/19/2019  . Migraine with aura and without status migrainosus, not intractable 01/19/2019  . Neck pain 01/19/2019  . Neuropathy of both feet 01/19/2019  . Nocturia more than twice per night 01/07/2015  . Anxiety 10/23/2014  . Candida, oral 10/23/2014  . Depression 10/23/2014  . Insomnia 10/23/2014  . Rash of entire body 10/23/2014    Past Surgical History:  Procedure Laterality Date  . ANKLE ARTHROSCOPY    . APPENDECTOMY    . BACK SURGERY     FUSION  . PENILE PROSTHESIS  REMOVAL  05/2020   Removal due to abscess  . PENILE PROSTHESIS PLACEMENT  04/2020  . THORACOTOMY Right 2008  ?  . TONSILLECTOMY  Family History  Problem Relation Age of Onset  . CAD Mother   . Lung cancer Mother   . CAD Father   . Lung cancer Father   . CAD Brother     Social History   Tobacco Use  . Smoking status: Never Smoker  . Smokeless tobacco: Never Used  Vaping Use  . Vaping Use: Never used  Substance Use Topics  . Alcohol use: Not Currently  . Drug use: Never    Home Medications Prior to Admission medications   Medication  Sig Start Date End Date Taking? Authorizing Provider  oxyCODONE-acetaminophen (PERCOCET/ROXICET) 5-325 MG tablet Take 1 tablet by mouth every 4 (four) hours as needed for up to 3 days for severe pain. 03/09/21 03/12/21 Yes Garald Balding, PA-C  AIMOVIG 140 MG/ML SOAJ Inject 140 mg into the skin every 30 (thirty) days.  09/24/20   [provider]  busPIRone (BUSPAR) 15 MG tablet Take 1 tablet (15 mg total) by mouth 3 (three) times daily. Patient taking differently: Take 30 mg by mouth 3 (three) times daily. 09/03/20   Connye Burkitt, NP  clonazePAM (KLONOPIN) 0.5 MG tablet Take 0.5 mg by mouth 3 (three) times daily. 10/01/19   [provider]  ezetimibe (ZETIA) 10 MG tablet Take 10 mg by mouth daily.    [provider]  isosorbide mononitrate (IMDUR) 60 MG 24 hr tablet TAKE 1 TABLET(60 MG) BY MOUTH DAILY 12/09/20   Elouise Munroe, MD  loratadine (CLARITIN) 10 MG tablet Take 10 mg by mouth daily.     [provider]  pantoprazole (PROTONIX) 40 MG tablet Take 40 mg by mouth daily before breakfast.  02/12/19   [provider]  propranolol ER (INDERAL LA) 60 MG 24 hr capsule Take 60 mg by mouth daily.  08/28/20   [provider]  Semaglutide,0.25 or 0.5MG /DOS, 2 MG/1.5ML SOPN Inject 0.5 mg into the skin every Friday.  10/08/19   [provider]  tamsulosin (FLOMAX) 0.4 MG CAPS capsule Take 0.4 mg by mouth at bedtime.  02/12/19   [provider]  tiZANidine (ZANAFLEX) 4 MG tablet Take 4 mg by mouth at bedtime.  03/08/19   [provider]  traMADol (ULTRAM) 50 MG tablet Take 50 mg by mouth 2 (two) times daily. Patient not taking: Reported on 01/22/2021    [provider]  TRIUMEQ 600-50-300 MG tablet Take 1 tablet by mouth daily.  02/12/19   [provider]  valACYclovir (VALTREX) 1000 MG tablet Take 1,000 mg by mouth 3 (three) times daily as needed (as directed for trigeminal neuralgia flares). Trig neuralgia flare  10/16/19   [provider]    Allergies    Adhesive [tape], Cyclobenzaprine, Duloxetine hcl, and Morphine  Review of Systems   Review of Systems  Constitutional: Negative for chills and fever.  HENT: Negative for ear pain and sore throat.   Eyes: Negative for pain and visual disturbance.  Respiratory: Negative for cough and shortness of breath.   Cardiovascular: Positive for chest pain. Negative for palpitations.  Gastrointestinal: Negative for abdominal pain and vomiting.  Genitourinary: Negative for dysuria and hematuria.  Musculoskeletal: Negative for arthralgias and back pain.  Skin: Negative for color change and rash.  Neurological: Negative for seizures and syncope.  All other systems reviewed and are negative.   Physical Exam Updated Vital Signs BP (!) 176/108 (BP Location: Right Arm)   Pulse 70   Temp 98.3 F (36.8 C) (Oral)   Resp  13   Ht 6' (1.829 m)   Wt 77.1 kg   SpO2 98%   BMI 23.06 kg/m   Physical Exam Vitals and nursing note reviewed.  Constitutional:      Appearance: He is well-developed.  HENT:     Head: Normocephalic and atraumatic.  Eyes:     Conjunctiva/sclera: Conjunctivae normal.  Cardiovascular:     Rate and Rhythm: Normal rate and regular rhythm.     Heart sounds: No murmur heard.   Pulmonary:     Effort: Pulmonary effort is normal. No respiratory distress.     Breath sounds: Normal breath sounds. No decreased breath sounds, wheezing, rhonchi or rales.  Chest:     Chest wall: Mass and tenderness present.     Comments: Right pec muscle with evidence of large 3 to 4 cm hematoma.  No overlying skin changes, no erythema, no warmth.  He has some point tenderness to the right lower rib cage as well.  No evidence of tachypnea, speaking full sentences without increased work of breathing. Abdominal:     Palpations: Abdomen is soft.     Tenderness: There is no abdominal tenderness.  Musculoskeletal:     Cervical back: Neck supple.   Skin:    General: Skin is warm and dry.     Findings: No erythema.  Neurological:     Mental Status: He is alert.     ED Results / Procedures / Treatments   Labs (all labs ordered are listed, but only abnormal results are displayed) Labs Reviewed  CBC - Abnormal; Notable for the following components:      Result Value   WBC 13.1 (*)    RBC 3.58 (*)    Hemoglobin 11.7 (*)    HCT 35.2 (*)    All other components within normal limits  COMPREHENSIVE METABOLIC PANEL - Abnormal; Notable for the following components:   Sodium 130 (*)    Chloride 93 (*)    Glucose, Bld 283 (*)    Calcium 8.4 (*)    Albumin 2.9 (*)    AST 11 (*)    All other components within normal limits  TROPONIN I (HIGH SENSITIVITY)    EKG EKG Interpretation  Date/Time:  Monday March 09 2021 10:47:24 EDT Ventricular Rate:  83 PR Interval:  120 QRS Duration: 82 QT Interval:  392 QTC Calculation: 460 R Axis:   22 Text Interpretation: Normal sinus rhythm Normal ECG No STEMI Confirmed by Octaviano Glow [id number]) on 03/09/2021 11:00:40 AM   Radiology CT Chest W Contrast  Result Date: 03/09/2021 CLINICAL DATA:  Fall 3 weeks prior with right rib fractures and persistent chest pain. EXAM: CT CHEST WITH CONTRAST TECHNIQUE: Multidetector CT imaging of the chest was performed during intravenous contrast administration. CONTRAST:  45mL OMNIPAQUE IOHEXOL 300 MG/ML  SOLN COMPARISON:  Chest radiograph from earlier today. 11/22/2019 chest CT angiogram. FINDINGS: Cardiovascular: Normal heart size. No significant pericardial effusion/thickening. Three-vessel coronary atherosclerosis. Atherosclerotic nonaneurysmal thoracic aorta. Top-normal caliber main pulmonary artery (3.0 cm diameter). No central pulmonary emboli. Mediastinum/Nodes: No discrete thyroid nodules. Unremarkable esophagus. No pathologically enlarged axillary, mediastinal or hilar lymph nodes. Lungs/Pleura: No pneumothorax. No pleural effusion. No acute  consolidative airspace disease, lung masses or significant pulmonary nodules. Mild platelike atelectasis in the anterior basilar left lower lobe. Mild patchy ground-glass opacity in peripheral posterior right mid lung. Upper abdomen: No acute abnormality. Musculoskeletal: No aggressive appearing focal osseous lesions. Nondisplaced acute anterior right sixth, seventh and eighth rib fractures with surrounding  chest wall hematoma measuring up to 8.1 x 4.9 cm in maximum axial dimensions at the level of the anterior right sixth rib fracture (series 2/image 114). Moderate thoracic spondylosis. IMPRESSION: 1. Nondisplaced acute anterior right sixth, seventh and eighth rib fractures with surrounding chest wall hematoma. No pneumothorax or hemothorax. No discrete bone lesions. Clinical follow-up advised to ensure resolution of the right chest wall hematoma. Any need for follow-up imaging should be based on clinical assessment. 2. Mild patchy ground-glass opacity in the peripheral posterior right mid lung, nonspecific, favor mild pulmonary contusion. 3. Mild platelike atelectasis at the left lung base. 4. Three-vessel coronary atherosclerosis. 5. Aortic Atherosclerosis (ICD10-I70.0). Electronically Signed   By: Ilona Sorrel M.D.   On: 03/09/2021 13:03   DG Chest Portable 1 View  Result Date: 03/09/2021 CLINICAL DATA:  Chest pain. Chest [place] hurting late yesterday. Complains of right-sided sternal pain and chest pain. EXAM: PORTABLE CHEST 1 VIEW COMPARISON:  None. FINDINGS: The heart size and mediastinal contours are within normal limits. Both lungs are clear. Blunting of the left costophrenic angle is noted, new from prior studies. The visualized skeletal structures are unremarkable. IMPRESSION: New blunting of left costophrenic angle may reflect small effusion. The lungs are otherwise clear. No signs of interstitial edema. Electronically Signed   By: Kerby Moors M.D.   On: 03/09/2021 11:26     Procedures Procedures   Medications Ordered in ED Medications  fentaNYL (SUBLIMAZE) injection 50 mcg (50 mcg Intravenous Given 03/09/21 1237)  iohexol (OMNIPAQUE) 300 MG/ML solution 100 mL (80 mLs Intravenous Contrast Given 03/09/21 1217)  sodium chloride 0.9 % bolus 1,000 mL (0 mLs Intravenous Stopped 03/09/21 1452)  HYDROmorphone (DILAUDID) injection 1 mg (1 mg Intravenous Given 03/09/21 1450)    ED Course  I have reviewed the triage vital signs and the nursing notes.  Pertinent labs & imaging results that were available during my care of the patient were reviewed by me and considered in my medical decision making (see chart for details).    MDM Rules/Calculators/A&P                         70 year old male presents with right-sided chest pain after a fall 3 weeks ago.  He has known rib fractures in the sixth, seventh and eighth ribs.  He reports that the pain is worse today.  Overall, he is slightly hypertensive with a blood pressure of 170/107, however no signs of hypertensive urgency/emergency.  Suspect this is secondary to pain.  He does have a visible hematoma near the right pec, however no overlying skin changes, no evidence of infection.  His CBC with a leukocytosis of 13.1, however patient is afebrile, low suspicion for infection at this time.  CMP with hyponatremia of 130, LFTs are normal.  Chest x-ray with left-sided blunting, which may represent small pleural effusion.  CT of the chest which confirms rib fractures, hematoma, mild pulmonary contusions on the right.  Patient was given 1 L of fluids here for hyponatremia.  He was given fentanyl and Dilaudid for pain.  He was also seen and evaluated with Dr. Langston Masker, overall workup is reasurring.  He was informed that it is to touch base with his pain doctor, however will give a short course of Percocet until then.  Referral to general surgery provided for possible evacuation of the hematoma.  Return precautions discussed.  He was  understanding is agreeable.  Stable for discharge  This was a shared visit  with my supervising physician Dr. Langston Masker who independently saw and evaluated the patient & provided guidance in evaluation/management/disposition ,in agreement with care  Final Clinical Impression(s) / ED Diagnoses Final diagnoses:  Closed fracture of multiple ribs of right side, initial encounter  Hematoma of right chest wall, initial encounter    Rx / DC Orders ED Discharge Orders         Ordered    oxyCODONE-acetaminophen (PERCOCET/ROXICET) 5-325 MG tablet  Every 4 hours PRN        03/09/21 1448           Lyndel Safe 03/09/21 1512    Wyvonnia Dusky, MD 03/09/21 1806

## 2021-03-09 NOTE — ED Notes (Signed)
patient reports taking pain medication prior to arrival

## 2021-03-13 ENCOUNTER — Other Ambulatory Visit: Payer: Self-pay

## 2021-03-13 ENCOUNTER — Inpatient Hospital Stay (HOSPITAL_BASED_OUTPATIENT_CLINIC_OR_DEPARTMENT_OTHER)
Admission: EM | Admit: 2021-03-13 | Discharge: 2021-04-10 | DRG: 853 | Disposition: A | Discharge status: Home Health | Payer: Medicare Other | Source: Other Acute Inpatient Hospital | Attending: Internal Medicine | Admitting: Internal Medicine

## 2021-03-13 ENCOUNTER — Emergency Department (HOSPITAL_BASED_OUTPATIENT_CLINIC_OR_DEPARTMENT_OTHER): Payer: Medicare Other

## 2021-03-13 ENCOUNTER — Encounter (HOSPITAL_COMMUNITY): Payer: Self-pay | Admitting: Internal Medicine

## 2021-03-13 DIAGNOSIS — N400 Enlarged prostate without lower urinary tract symptoms: Secondary | ICD-10-CM | POA: Diagnosis not present

## 2021-03-13 DIAGNOSIS — E1122 Type 2 diabetes mellitus with diabetic chronic kidney disease: Secondary | ICD-10-CM | POA: Diagnosis present

## 2021-03-13 DIAGNOSIS — B20 Human immunodeficiency virus [HIV] disease: Secondary | ICD-10-CM | POA: Diagnosis not present

## 2021-03-13 DIAGNOSIS — L02213 Cutaneous abscess of chest wall: Secondary | ICD-10-CM | POA: Diagnosis not present

## 2021-03-13 DIAGNOSIS — K5903 Drug induced constipation: Secondary | ICD-10-CM | POA: Diagnosis not present

## 2021-03-13 DIAGNOSIS — N179 Acute kidney failure, unspecified: Secondary | ICD-10-CM | POA: Diagnosis not present

## 2021-03-13 DIAGNOSIS — K219 Gastro-esophageal reflux disease without esophagitis: Secondary | ICD-10-CM | POA: Diagnosis not present

## 2021-03-13 DIAGNOSIS — D631 Anemia in chronic kidney disease: Secondary | ICD-10-CM | POA: Diagnosis present

## 2021-03-13 DIAGNOSIS — N170 Acute kidney failure with tubular necrosis: Secondary | ICD-10-CM | POA: Diagnosis present

## 2021-03-13 DIAGNOSIS — Z79899 Other long term (current) drug therapy: Secondary | ICD-10-CM

## 2021-03-13 DIAGNOSIS — E43 Unspecified severe protein-calorie malnutrition: Secondary | ICD-10-CM | POA: Diagnosis not present

## 2021-03-13 DIAGNOSIS — Z888 Allergy status to other drugs, medicaments and biological substances status: Secondary | ICD-10-CM

## 2021-03-13 DIAGNOSIS — Z765 Malingerer [conscious simulation]: Secondary | ICD-10-CM | POA: Diagnosis not present

## 2021-03-13 DIAGNOSIS — E872 Acidosis, unspecified: Secondary | ICD-10-CM

## 2021-03-13 DIAGNOSIS — Z21 Asymptomatic human immunodeficiency virus [HIV] infection status: Secondary | ICD-10-CM | POA: Diagnosis present

## 2021-03-13 DIAGNOSIS — S2241XA Multiple fractures of ribs, right side, initial encounter for closed fracture: Secondary | ICD-10-CM | POA: Diagnosis not present

## 2021-03-13 DIAGNOSIS — E869 Volume depletion, unspecified: Secondary | ICD-10-CM | POA: Diagnosis present

## 2021-03-13 DIAGNOSIS — I129 Hypertensive chronic kidney disease with stage 1 through stage 4 chronic kidney disease, or unspecified chronic kidney disease: Secondary | ICD-10-CM | POA: Diagnosis not present

## 2021-03-13 DIAGNOSIS — W1789XA Other fall from one level to another, initial encounter: Secondary | ICD-10-CM | POA: Diagnosis present

## 2021-03-13 DIAGNOSIS — I2510 Atherosclerotic heart disease of native coronary artery without angina pectoris: Secondary | ICD-10-CM | POA: Diagnosis present

## 2021-03-13 DIAGNOSIS — Z6823 Body mass index (BMI) 23.0-23.9, adult: Secondary | ICD-10-CM

## 2021-03-13 DIAGNOSIS — E1143 Type 2 diabetes mellitus with diabetic autonomic (poly)neuropathy: Secondary | ICD-10-CM | POA: Diagnosis present

## 2021-03-13 DIAGNOSIS — M869 Osteomyelitis, unspecified: Secondary | ICD-10-CM

## 2021-03-13 DIAGNOSIS — L03313 Cellulitis of chest wall: Secondary | ICD-10-CM | POA: Diagnosis not present

## 2021-03-13 DIAGNOSIS — R6520 Severe sepsis without septic shock: Secondary | ICD-10-CM

## 2021-03-13 DIAGNOSIS — F419 Anxiety disorder, unspecified: Secondary | ICD-10-CM | POA: Diagnosis present

## 2021-03-13 DIAGNOSIS — Z91048 Other nonmedicinal substance allergy status: Secondary | ICD-10-CM

## 2021-03-13 DIAGNOSIS — E1169 Type 2 diabetes mellitus with other specified complication: Secondary | ICD-10-CM | POA: Diagnosis present

## 2021-03-13 DIAGNOSIS — Y92230 Patient room in hospital as the place of occurrence of the external cause: Secondary | ICD-10-CM | POA: Diagnosis not present

## 2021-03-13 DIAGNOSIS — N182 Chronic kidney disease, stage 2 (mild): Secondary | ICD-10-CM | POA: Diagnosis present

## 2021-03-13 DIAGNOSIS — B182 Chronic viral hepatitis C: Secondary | ICD-10-CM | POA: Diagnosis present

## 2021-03-13 DIAGNOSIS — J869 Pyothorax without fistula: Secondary | ICD-10-CM | POA: Diagnosis present

## 2021-03-13 DIAGNOSIS — G894 Chronic pain syndrome: Secondary | ICD-10-CM | POA: Diagnosis present

## 2021-03-13 DIAGNOSIS — Z981 Arthrodesis status: Secondary | ICD-10-CM

## 2021-03-13 DIAGNOSIS — A419 Sepsis, unspecified organism: Secondary | ICD-10-CM | POA: Diagnosis present

## 2021-03-13 DIAGNOSIS — A4102 Sepsis due to Methicillin resistant Staphylococcus aureus: Secondary | ICD-10-CM | POA: Diagnosis not present

## 2021-03-13 DIAGNOSIS — M8610 Other acute osteomyelitis, unspecified site: Secondary | ICD-10-CM | POA: Diagnosis not present

## 2021-03-13 DIAGNOSIS — Z8249 Family history of ischemic heart disease and other diseases of the circulatory system: Secondary | ICD-10-CM

## 2021-03-13 DIAGNOSIS — E782 Mixed hyperlipidemia: Secondary | ICD-10-CM

## 2021-03-13 DIAGNOSIS — E785 Hyperlipidemia, unspecified: Secondary | ICD-10-CM | POA: Diagnosis present

## 2021-03-13 DIAGNOSIS — Z20822 Contact with and (suspected) exposure to covid-19: Secondary | ICD-10-CM | POA: Diagnosis not present

## 2021-03-13 DIAGNOSIS — Z7984 Long term (current) use of oral hypoglycemic drugs: Secondary | ICD-10-CM

## 2021-03-13 DIAGNOSIS — E876 Hypokalemia: Secondary | ICD-10-CM | POA: Diagnosis present

## 2021-03-13 DIAGNOSIS — R58 Hemorrhage, not elsewhere classified: Secondary | ICD-10-CM | POA: Diagnosis not present

## 2021-03-13 DIAGNOSIS — F329 Major depressive disorder, single episode, unspecified: Secondary | ICD-10-CM | POA: Diagnosis present

## 2021-03-13 DIAGNOSIS — Z8616 Personal history of COVID-19: Secondary | ICD-10-CM

## 2021-03-13 DIAGNOSIS — Z09 Encounter for follow-up examination after completed treatment for conditions other than malignant neoplasm: Secondary | ICD-10-CM

## 2021-03-13 DIAGNOSIS — T402X5A Adverse effect of other opioids, initial encounter: Secondary | ICD-10-CM | POA: Diagnosis not present

## 2021-03-13 DIAGNOSIS — S20211A Contusion of right front wall of thorax, initial encounter: Secondary | ICD-10-CM | POA: Diagnosis present

## 2021-03-13 DIAGNOSIS — B9562 Methicillin resistant Staphylococcus aureus infection as the cause of diseases classified elsewhere: Secondary | ICD-10-CM | POA: Diagnosis not present

## 2021-03-13 DIAGNOSIS — L0291 Cutaneous abscess, unspecified: Secondary | ICD-10-CM

## 2021-03-13 DIAGNOSIS — I10 Essential (primary) hypertension: Secondary | ICD-10-CM | POA: Diagnosis not present

## 2021-03-13 DIAGNOSIS — Z885 Allergy status to narcotic agent status: Secondary | ICD-10-CM

## 2021-03-13 LAB — CBC WITH DIFFERENTIAL/PLATELET
Abs Immature Granulocytes: 0.18 10*3/uL — ABNORMAL HIGH (ref 0.00–0.07)
Basophils Absolute: 0 10*3/uL (ref 0.0–0.1)
Basophils Relative: 0 %
Eosinophils Absolute: 0.1 10*3/uL (ref 0.0–0.5)
Eosinophils Relative: 0 %
HCT: 31.8 % — ABNORMAL LOW (ref 39.0–52.0)
Hemoglobin: 10.5 g/dL — ABNORMAL LOW (ref 13.0–17.0)
Immature Granulocytes: 1 %
Lymphocytes Relative: 3 %
Lymphs Abs: 0.5 10*3/uL — ABNORMAL LOW (ref 0.7–4.0)
MCH: 32.7 pg (ref 26.0–34.0)
MCHC: 33 g/dL (ref 30.0–36.0)
MCV: 99.1 fL (ref 80.0–100.0)
Monocytes Absolute: 1 10*3/uL (ref 0.1–1.0)
Monocytes Relative: 6 %
Neutro Abs: 16.4 10*3/uL — ABNORMAL HIGH (ref 1.7–7.7)
Neutrophils Relative %: 90 %
Platelets: 262 10*3/uL (ref 150–400)
RBC: 3.21 MIL/uL — ABNORMAL LOW (ref 4.22–5.81)
RDW: 12.9 % (ref 11.5–15.5)
WBC: 18.1 10*3/uL — ABNORMAL HIGH (ref 4.0–10.5)
nRBC: 0 % (ref 0.0–0.2)

## 2021-03-13 LAB — URINALYSIS, ROUTINE W REFLEX MICROSCOPIC
Bacteria, UA: NONE SEEN
Bilirubin Urine: NEGATIVE
Glucose, UA: 50 mg/dL — AB
Ketones, ur: NEGATIVE mg/dL
Leukocytes,Ua: NEGATIVE
Nitrite: NEGATIVE
Protein, ur: 30 mg/dL — AB
Specific Gravity, Urine: 1.044 — ABNORMAL HIGH (ref 1.005–1.030)
pH: 5 (ref 5.0–8.0)

## 2021-03-13 LAB — COMPREHENSIVE METABOLIC PANEL
ALT: 8 U/L (ref 0–44)
AST: 10 U/L — ABNORMAL LOW (ref 15–41)
Albumin: 2.2 g/dL — ABNORMAL LOW (ref 3.5–5.0)
Alkaline Phosphatase: 91 U/L (ref 38–126)
Anion gap: 11 (ref 5–15)
BUN: 32 mg/dL — ABNORMAL HIGH (ref 8–23)
CO2: 25 mmol/L (ref 22–32)
Calcium: 7.9 mg/dL — ABNORMAL LOW (ref 8.9–10.3)
Chloride: 95 mmol/L — ABNORMAL LOW (ref 98–111)
Creatinine, Ser: 1.56 mg/dL — ABNORMAL HIGH (ref 0.61–1.24)
GFR, Estimated: 48 mL/min — ABNORMAL LOW (ref >60)
Glucose, Bld: 327 mg/dL — ABNORMAL HIGH (ref 70–99)
Potassium: 3.5 mmol/L (ref 3.5–5.1)
Sodium: 131 mmol/L — ABNORMAL LOW (ref 135–145)
Total Bilirubin: 0.7 mg/dL (ref 0.3–1.2)
Total Protein: 6 g/dL — ABNORMAL LOW (ref 6.5–8.1)

## 2021-03-13 LAB — PROTIME-INR
INR: 1.3 — ABNORMAL HIGH (ref 0.8–1.2)
Prothrombin Time: 15.3 seconds — ABNORMAL HIGH (ref 11.4–15.2)

## 2021-03-13 LAB — RESP PANEL BY RT-PCR (FLU A&B, COVID) ARPGX2
Influenza A by PCR: NEGATIVE
Influenza B by PCR: NEGATIVE
SARS Coronavirus 2 by RT PCR: NEGATIVE

## 2021-03-13 LAB — APTT: aPTT: 37 seconds — ABNORMAL HIGH (ref 24–36)

## 2021-03-13 LAB — CBG MONITORING, ED: Glucose-Capillary: 328 mg/dL — ABNORMAL HIGH (ref 70–99)

## 2021-03-13 LAB — LACTIC ACID, PLASMA
Lactic Acid, Venous: 1.9 mmol/L (ref 0.5–1.9)
Lactic Acid, Venous: 2.4 mmol/L (ref 0.5–1.9)

## 2021-03-13 MED ORDER — PANTOPRAZOLE SODIUM 40 MG PO TBEC
40.0000 mg | DELAYED_RELEASE_TABLET | Freq: Every day | ORAL | Status: DC
  Administered 2021-03-14 – 2021-04-10 (×27): 40 mg via ORAL
  Filled 2021-03-13 (×28): qty 1

## 2021-03-13 MED ORDER — HYDROMORPHONE HCL 1 MG/ML IJ SOLN
0.5000 mg | INTRAMUSCULAR | Status: DC | PRN
  Administered 2021-03-14 (×2): 0.5 mg via INTRAVENOUS
  Filled 2021-03-13 (×2): qty 0.5

## 2021-03-13 MED ORDER — FENTANYL CITRATE (PF) 100 MCG/2ML IJ SOLN
50.0000 ug | Freq: Once | INTRAMUSCULAR | Status: AC
Start: 2021-03-13 — End: 2021-03-13
  Administered 2021-03-13: 50 ug via INTRAVENOUS
  Filled 2021-03-13: qty 2

## 2021-03-13 MED ORDER — PIPERACILLIN-TAZOBACTAM 3.375 G IVPB
3.3750 g | Freq: Three times a day (TID) | INTRAVENOUS | Status: DC
  Administered 2021-03-13: 3.375 g via INTRAVENOUS
  Filled 2021-03-13 (×2): qty 50

## 2021-03-13 MED ORDER — ONDANSETRON HCL 4 MG PO TABS: 4.0000 mg | ORAL_TABLET | Freq: Four times a day (QID) | ORAL | Status: DC | PRN

## 2021-03-13 MED ORDER — SODIUM CHLORIDE 0.9 % IV BOLUS
1000.0000 mL | Freq: Once | INTRAVENOUS | Status: AC
  Administered 2021-03-13: 1000 mL via INTRAVENOUS

## 2021-03-13 MED ORDER — POLYETHYLENE GLYCOL 3350 17 G PO PACK
17.0000 g | PACK | Freq: Every day | ORAL | Status: DC | PRN
  Administered 2021-04-01: 17 g via ORAL
  Filled 2021-03-13: qty 1

## 2021-03-13 MED ORDER — LACTATED RINGERS IV BOLUS
500.0000 mL | Freq: Once | INTRAVENOUS | Status: AC
  Administered 2021-03-13: 500 mL via INTRAVENOUS

## 2021-03-13 MED ORDER — LACTATED RINGERS IV SOLN: INTRAVENOUS | Status: DC

## 2021-03-13 MED ORDER — ISOSORBIDE MONONITRATE ER 60 MG PO TB24
60.0000 mg | ORAL_TABLET | Freq: Every day | ORAL | Status: DC
  Filled 2021-03-13: qty 1

## 2021-03-13 MED ORDER — ABACAVIR-DOLUTEGRAVIR-LAMIVUD 600-50-300 MG PO TABS
1.0000 | ORAL_TABLET | Freq: Every day | ORAL | Status: DC
  Administered 2021-03-14 – 2021-04-10 (×27): 1 via ORAL
  Filled 2021-03-13 (×28): qty 1

## 2021-03-13 MED ORDER — CLINDAMYCIN PHOSPHATE 900 MG/50ML IV SOLN
900.0000 mg | Freq: Once | INTRAVENOUS | Status: AC
  Administered 2021-03-13: 900 mg via INTRAVENOUS
  Filled 2021-03-13: qty 50

## 2021-03-13 MED ORDER — ACETAMINOPHEN 325 MG PO TABS
650.0000 mg | ORAL_TABLET | Freq: Four times a day (QID) | ORAL | Status: DC | PRN
  Administered 2021-03-15: 650 mg via ORAL
  Filled 2021-03-13: qty 2

## 2021-03-13 MED ORDER — SODIUM CHLORIDE 0.9 % IV SOLN: 2.0000 g | Freq: Two times a day (BID) | INTRAVENOUS | Status: DC

## 2021-03-13 MED ORDER — EZETIMIBE 10 MG PO TABS
10.0000 mg | ORAL_TABLET | Freq: Every day | ORAL | Status: DC
  Administered 2021-03-14 – 2021-04-10 (×27): 10 mg via ORAL
  Filled 2021-03-13 (×27): qty 1

## 2021-03-13 MED ORDER — SODIUM CHLORIDE 0.9 % IV SOLN
1.0000 g | Freq: Three times a day (TID) | INTRAVENOUS | Status: DC
  Administered 2021-03-14 – 2021-03-16 (×7): 1 g via INTRAVENOUS
  Filled 2021-03-13 (×9): qty 1

## 2021-03-13 MED ORDER — SODIUM CHLORIDE 0.9 % IV SOLN: INTRAVENOUS | Status: DC | PRN

## 2021-03-13 MED ORDER — HYDROMORPHONE HCL 1 MG/ML IJ SOLN
1.0000 mg | INTRAMUSCULAR | Status: DC | PRN
  Administered 2021-03-13 – 2021-03-14 (×2): 1 mg via INTRAVENOUS
  Filled 2021-03-13 (×2): qty 1

## 2021-03-13 MED ORDER — PROPRANOLOL HCL ER 60 MG PO CP24
60.0000 mg | ORAL_CAPSULE | Freq: Every day | ORAL | Status: DC
  Filled 2021-03-13: qty 1

## 2021-03-13 MED ORDER — VANCOMYCIN HCL IN DEXTROSE 1-5 GM/200ML-% IV SOLN
1000.0000 mg | Freq: Once | INTRAVENOUS | Status: AC
  Administered 2021-03-13: 1000 mg via INTRAVENOUS
  Filled 2021-03-13: qty 200

## 2021-03-13 MED ORDER — VANCOMYCIN HCL 1250 MG/250ML IV SOLN
1250.0000 mg | INTRAVENOUS | Status: DC
  Administered 2021-03-14: 1250 mg via INTRAVENOUS
  Filled 2021-03-13 (×3): qty 250

## 2021-03-13 MED ORDER — TIZANIDINE HCL 4 MG PO TABS
4.0000 mg | ORAL_TABLET | Freq: Every day | ORAL | Status: DC
  Administered 2021-03-13: 4 mg via ORAL
  Filled 2021-03-13: qty 1

## 2021-03-13 MED ORDER — LACTATED RINGERS IV BOLUS (SEPSIS)
1000.0000 mL | Freq: Once | INTRAVENOUS | Status: AC
  Administered 2021-03-13: 1000 mL via INTRAVENOUS

## 2021-03-13 MED ORDER — TAMSULOSIN HCL 0.4 MG PO CAPS
0.4000 mg | ORAL_CAPSULE | Freq: Every day | ORAL | Status: DC
  Administered 2021-03-13 – 2021-04-09 (×28): 0.4 mg via ORAL
  Filled 2021-03-13 (×28): qty 1

## 2021-03-13 MED ORDER — ACETAMINOPHEN 650 MG RE SUPP: 650.0000 mg | Freq: Four times a day (QID) | RECTAL | Status: DC | PRN

## 2021-03-13 MED ORDER — CLONAZEPAM 0.5 MG PO TABS
0.5000 mg | ORAL_TABLET | Freq: Three times a day (TID) | ORAL | Status: DC
  Administered 2021-03-13 – 2021-03-14 (×2): 0.5 mg via ORAL
  Filled 2021-03-13 (×2): qty 1

## 2021-03-13 MED ORDER — LACTATED RINGERS IV BOLUS (SEPSIS)
500.0000 mL | Freq: Once | INTRAVENOUS | Status: AC
  Administered 2021-03-13: 500 mL via INTRAVENOUS

## 2021-03-13 MED ORDER — BUSPIRONE HCL 5 MG PO TABS
30.0000 mg | ORAL_TABLET | Freq: Two times a day (BID) | ORAL | Status: DC
  Administered 2021-03-13 – 2021-04-10 (×55): 30 mg via ORAL
  Filled 2021-03-13 (×55): qty 6

## 2021-03-13 MED ORDER — ONDANSETRON HCL 4 MG/2ML IJ SOLN
4.0000 mg | Freq: Four times a day (QID) | INTRAMUSCULAR | Status: DC | PRN
  Administered 2021-03-15 – 2021-03-27 (×5): 4 mg via INTRAVENOUS
  Filled 2021-03-13 (×5): qty 2

## 2021-03-13 MED ORDER — IOHEXOL 300 MG/ML  SOLN
100.0000 mL | Freq: Once | INTRAMUSCULAR | Status: AC
  Administered 2021-03-13: 100 mL via INTRAVENOUS

## 2021-03-13 MED ORDER — FENTANYL CITRATE (PF) 100 MCG/2ML IJ SOLN
50.0000 ug | INTRAMUSCULAR | Status: AC | PRN
  Administered 2021-03-13 (×3): 50 ug via INTRAVENOUS
  Filled 2021-03-13 (×3): qty 2

## 2021-03-13 MED ORDER — INSULIN ASPART 100 UNIT/ML ~~LOC~~ SOLN
0.0000 [IU] | Freq: Three times a day (TID) | SUBCUTANEOUS | Status: DC
  Administered 2021-03-14: 2 [IU] via SUBCUTANEOUS
  Administered 2021-03-15: 3 [IU] via SUBCUTANEOUS
  Administered 2021-03-15 – 2021-03-19 (×9): 2 [IU] via SUBCUTANEOUS
  Administered 2021-03-19: 3 [IU] via SUBCUTANEOUS
  Administered 2021-03-19: 2 [IU] via SUBCUTANEOUS
  Administered 2021-03-20 (×2): 5 [IU] via SUBCUTANEOUS
  Administered 2021-03-20: 2 [IU] via SUBCUTANEOUS
  Administered 2021-03-20: 3 [IU] via SUBCUTANEOUS
  Administered 2021-03-21 (×2): 2 [IU] via SUBCUTANEOUS
  Administered 2021-03-21: 3 [IU] via SUBCUTANEOUS
  Administered 2021-03-21 – 2021-03-22 (×2): 2 [IU] via SUBCUTANEOUS
  Administered 2021-03-22: 3 [IU] via SUBCUTANEOUS
  Administered 2021-03-22: 2 [IU] via SUBCUTANEOUS
  Administered 2021-03-22 – 2021-03-23 (×3): 3 [IU] via SUBCUTANEOUS
  Administered 2021-03-23 – 2021-03-25 (×6): 2 [IU] via SUBCUTANEOUS
  Administered 2021-03-25 – 2021-03-26 (×3): 3 [IU] via SUBCUTANEOUS
  Administered 2021-03-26 – 2021-03-27 (×2): 2 [IU] via SUBCUTANEOUS
  Administered 2021-03-27: 5 [IU] via SUBCUTANEOUS
  Administered 2021-03-27 – 2021-03-28 (×2): 3 [IU] via SUBCUTANEOUS
  Administered 2021-03-28 (×3): 2 [IU] via SUBCUTANEOUS
  Administered 2021-03-29: 3 [IU] via SUBCUTANEOUS
  Administered 2021-03-29: 2 [IU] via SUBCUTANEOUS
  Administered 2021-03-29 (×2): 3 [IU] via SUBCUTANEOUS
  Administered 2021-03-30: 2 [IU] via SUBCUTANEOUS
  Administered 2021-03-30 (×2): 3 [IU] via SUBCUTANEOUS
  Administered 2021-03-31: 2 [IU] via SUBCUTANEOUS
  Administered 2021-03-31 (×3): 3 [IU] via SUBCUTANEOUS
  Administered 2021-04-01 (×3): 2 [IU] via SUBCUTANEOUS
  Administered 2021-04-01 – 2021-04-02 (×2): 3 [IU] via SUBCUTANEOUS
  Administered 2021-04-02 (×2): 2 [IU] via SUBCUTANEOUS
  Administered 2021-04-02: 3 [IU] via SUBCUTANEOUS
  Administered 2021-04-03: 2 [IU] via SUBCUTANEOUS
  Administered 2021-04-03 – 2021-04-04 (×6): 3 [IU] via SUBCUTANEOUS
  Administered 2021-04-05 (×2): 2 [IU] via SUBCUTANEOUS
  Administered 2021-04-05: 3 [IU] via SUBCUTANEOUS
  Administered 2021-04-06: 2 [IU] via SUBCUTANEOUS
  Administered 2021-04-06 – 2021-04-07 (×3): 3 [IU] via SUBCUTANEOUS
  Administered 2021-04-07: 2 [IU] via SUBCUTANEOUS
  Administered 2021-04-08 (×2): 3 [IU] via SUBCUTANEOUS
  Administered 2021-04-08 – 2021-04-09 (×5): 2 [IU] via SUBCUTANEOUS
  Administered 2021-04-10 (×2): 3 [IU] via SUBCUTANEOUS

## 2021-03-13 MED ORDER — PIPERACILLIN-TAZOBACTAM 3.375 G IVPB 30 MIN
3.3750 g | Freq: Once | INTRAVENOUS | Status: AC
  Administered 2021-03-13: 3.375 g via INTRAVENOUS
  Filled 2021-03-13: qty 50

## 2021-03-13 MED ORDER — LORATADINE 10 MG PO TABS
10.0000 mg | ORAL_TABLET | Freq: Every day | ORAL | Status: DC
  Filled 2021-03-13: qty 1

## 2021-03-13 NOTE — ED Notes (Signed)
Patient transported to CT 

## 2021-03-13 NOTE — ED Triage Notes (Signed)
Pt states rib fx right side, seen for same on Monday. Attempted to follow up with surgery Dr Donne Hazel, told not able to do procedure,  Pain continues, swelling worsened.

## 2021-03-13 NOTE — Sepsis Progress Note (Signed)
eLink is monitoring this Code Sepsis. °

## 2021-03-13 NOTE — Progress Notes (Signed)
Pharmacy Antibiotic Note  Brian Malone is a 70 y.o. male admitted on 03/13/2021 with sepsis and cellulitis.  Pharmacy has been consulted for vancomycin dosing. Pt is afebrile and WBC is elevated at 18.1. Scr is elevated above baseline at 1.56. Lactic acid is 1.9. Pt is hypotensive.   Plan: Vancomycin 1gm IV x 1 then 1250mg  IV Q24H F/u renal fxn, C&S, clinical status and peak/trough at SS  Height: 6' (182.9 cm) Weight: 77.1 kg (170 lb) IBW/kg (Calculated) : 77.6  Temp (24hrs), Avg:98 F (36.7 C), Min:98 F (36.7 C), Max:98 F (36.7 C)  Recent Labs  Lab 03/09/21 1110 03/13/21 1320  WBC 13.1* 18.1*  CREATININE 1.03 1.56*  LATICACIDVEN  --  1.9    Estimated Creatinine Clearance: 50.1 mL/min (A) (by C-G formula based on SCr of 1.56 mg/dL (H)).    Allergies  Allergen Reactions  . Adhesive [Tape] Other (See Comments)    "Tape will take off my skin, as will Band-Aids"  . Cyclobenzaprine Other (See Comments)    Hallucinations  . Duloxetine Hcl Other (See Comments)    Makes PTSD- induced nightmares more vivid   . Morphine Itching and Other (See Comments)    Hallucinations, aggression, and makes patient altered also      Antimicrobials this admission: Vanc 3/25>> Zosyn 3/25>>  Dose adjustments this admission: N/A  Microbiology results: Pending  Thank you for allowing pharmacy to be a part of this patient's care.  Rumbarger, Rande Lawman 03/13/2021 12:34 PM

## 2021-03-13 NOTE — ED Notes (Signed)
Attempted to call report, nurse not availble. Charge nurse states nurse will call back.

## 2021-03-13 NOTE — H&P (Signed)
History and Physical    Brian Malone KAJ:[id number] DOB: 10-11-1951 DOA: 03/13/2021  PCP: Doreatha Lew, MD  Patient coming from: [facility]   Chief Complaint:  Chief Complaint  Patient presents with  . Rib Injury     HPI:    70 year old male with past medical history of hepatitis C, hypertension, hyperlipidemia, diabetes mellitus type 2, chronic pain syndrome, intravenous drug use (abstinent since 2019), major depressive disorder and HIV who presents to [facility] as a transfer from [facility] for right chest wall pain and shortness of breath found to have right chest wall abscess formation and cellulitis with concerns for sepsis.  On March 6, patient explains that he was in his front yard when he stepped into a ditch and fell, landing directly on his right chest.  Patient experienced severe pain in the right chest over the following 48 hours, worsened with deep inspiration associated with shortness of breath.  Patient presented to see his primary care provider 2 days later on 3/8 surprisingly revealed no evidence of rib fractures.\  In the days that followed, patient continued to experience severe right chest wall pain which was quite debilitating for him.  Patient eventually presented to a local urgent care clinic on 3/11 for repeat evaluation where x-ray was repeated of the ribs and this time revealed fractures of ribs 6, 7 and 8.  Patient was prescribed some analgesics and discharged home at that time.  Despite conservative management patient's right chest wall pain continued to worsen.  Associated shortness of breath additionally continue to worsen.  Patient explains that his chest discomfort has been relentless occurring all day and all night leading to him being unable to sleep and even making it difficult for him to tolerate oral intake.  Patient's pain is now severe in intensity, sharp in quality, located in the right lateral chest wall, nonradiating, worse  with deep inspiration and worse with movement.  Patient's pain continued to persist until he eventually presented to [place] emergency department on 3/21 where CT imaging of the chest was performed in addition to revealing the known fractures of ribs 6 7 and 8 there is identification of a chest wall hematoma and pulmonary contusion.  Patient again was discharged home with additional home-going opiate-based analgesics.  This time, shortly after the patient went home, the patient began to develop redness and swelling of the right lateral chest wall.  Patient continued to experience severe right chest pain described as above.  Patient denies fevers but states that he began to develop chills and night sweats.  Associated weakness and poor appetite continue to persist.    Unfortunately due to these persisting symptoms the patient once again presented to [place] emergency department on 3/25 were repeat CT imaging of the chest revealed developing chest wall collections with air bubbles surrounding the broken ribs concerning for abscess formations.  Additionally a concerning pattern was developing concerning for a regular destruction of the ribs.  I am discussed the patient's condition and imaging with Dr. Kipp Brood with cardiothoracic surgery.  Dr. Kipp Brood recommended transfer to [facility] for hospital admission, intravenous antibiotics and interventional radiology consultation for drain placement.  The hospitalist group has accepted the patient as a transfer to [facility] for further medical care.  Review of Systems:   Review of Systems  Constitutional: Positive for chills, diaphoresis, malaise/fatigue and weight loss.  Respiratory: Positive for shortness of breath.   Cardiovascular:  Positive for chest pain.  Musculoskeletal: Positive for myalgias.  Neurological: Positive for weakness.  All other systems reviewed and are negative.   Past Medical History:   Diagnosis Date  . Acute subdural hematoma ([place]) 10/28/2019  . Anxiety   . Cellulitis of right upper extremity 11/04/2020  . Diabetes mellitus without complication ([place])   . History of COVID-19 05/26/2020  . HIV (human immunodeficiency virus infection) ([place])   . Hypertension   . Renal disorder     Past Surgical History:  Procedure Laterality Date  . ANKLE ARTHROSCOPY    . APPENDECTOMY    . BACK SURGERY     FUSION  . PENILE PROSTHESIS  REMOVAL  05/2020   Removal due to abscess  . PENILE PROSTHESIS PLACEMENT  04/2020  . THORACOTOMY Right 2008  ?  . TONSILLECTOMY       reports that he has never smoked. He has never used smokeless tobacco. He reports previous alcohol use. He reports that he does not use drugs.  Allergies  Allergen Reactions  . Adhesive [Tape] Other (See Comments)    "Tape will take off my skin, as will Band-Aids"  . Cyclobenzaprine Other (See Comments)    Hallucinations  . Duloxetine Hcl Other (See Comments)    Makes PTSD- induced nightmares more vivid   . Morphine Itching and Other (See Comments)    Hallucinations, aggression, and makes patient altered also      Family History  Problem Relation Age of Onset  . CAD Mother   . Lung cancer Mother   . CAD Father   . Lung cancer Father   . CAD Brother      Prior to Admission medications   Medication Sig Start Date End Date Taking? Authorizing Provider  AIMOVIG 140 MG/ML SOAJ Inject 140 mg into the skin every 30 (thirty) days.  09/24/20   [provider]  busPIRone (BUSPAR) 15 MG tablet Take 1 tablet (15 mg total) by mouth 3 (three) times daily. Patient taking differently: Take 30 mg by mouth 3 (three) times daily. 09/03/20   Connye Burkitt, NP  clonazePAM (KLONOPIN) 0.5 MG tablet Take 0.5 mg by mouth 3 (three) times daily. 10/01/19   [provider]  ezetimibe (ZETIA) 10 MG tablet Take 10 mg by mouth daily.    [provider]  isosorbide mononitrate (IMDUR) 60 MG 24 hr tablet  TAKE 1 TABLET(60 MG) BY MOUTH DAILY 12/09/20   Elouise Munroe, MD  loratadine (CLARITIN) 10 MG tablet Take 10 mg by mouth daily.     [provider]  pantoprazole (PROTONIX) 40 MG tablet Take 40 mg by mouth daily before breakfast.  02/12/19   [provider]  propranolol ER (INDERAL LA) 60 MG 24 hr capsule Take 60 mg by mouth daily.  08/28/20   [provider]  Semaglutide,0.25 or 0.5MG /DOS, 2 MG/1.5ML SOPN Inject 0.5 mg into the skin every Friday.  10/08/19   [provider]  tamsulosin (FLOMAX) 0.4 MG CAPS capsule Take 0.4 mg by mouth at bedtime.  02/12/19   [provider]  tiZANidine (ZANAFLEX) 4 MG tablet Take 4 mg by mouth at bedtime.  03/08/19   [provider]  traMADol (ULTRAM) 50 MG tablet Take 50 mg by mouth 2 (two) times daily. Patient not taking: Reported on 01/22/2021    [provider]  TRIUMEQ 600-50-300 MG tablet Take 1 tablet by mouth daily.  02/12/19   [provider]  valACYclovir (VALTREX) 1000 MG  tablet Take 1,000 mg by mouth 3 (three) times daily as needed (as directed for trigeminal neuralgia flares). Trig neuralgia flare 10/16/19   [provider]    Physical Exam: Vitals:   03/13/21 1900 03/13/21 1929 03/13/21 2000 03/13/21 2123  BP: 112/73 123/79 114/81 117/73  Pulse: 92  92 (!) 102  Resp: 14 18 19 18   Temp:    97.6 F (36.4 C)  TempSrc:    Oral  SpO2: 100% 100% 100% 98%  Weight:    80.7 kg  Height:    6' (1.829 m)    Constitutional: Awake alert and oriented x3, patient is in distress due to chest pain. Skin: Significant hyperemia and induration noted of the right lateral chest wall with associated tenderness warmth.  No other rashes or lesions noted.  Poor skin turgor noted.   Eyes: Pupils are equally reactive to light.  No evidence of scleral icterus or conjunctival pallor.  ENMT: Dry mucous membranes noted.  Posterior pharynx clear of any exudate or lesions.   Neck: normal,  supple, no masses, no thyromegaly.  No evidence of jugular venous distension.   Respiratory: Diminished breath sounds at the bases with faint bibasilar rales.  No evidence of associated wheezing. Normal respiratory effort. No accessory muscle use.  Cardiovascular: Rate with regular rhythm, no murmurs / rubs / gallops. No extremity edema. 2+ pedal pulses. No carotid bruits.  Chest:   Nontender without crepitus or deformity.   Back:   Nontender without crepitus or deformity. Abdomen: Abdomen is soft and nontender.  No evidence of intra-abdominal masses.  Positive bowel sounds noted in all quadrants.   Musculoskeletal: No joint deformity upper and lower extremities. Good ROM, no contractures. Normal muscle tone.  Neurologic: CN 2-12 grossly intact. Sensation intact.  Patient moving all 4 extremities spontaneously.  Patient is following all commands.  Patient is responsive to verbal stimuli.   Psychiatric: Patient exhibits anxious mood with labile affect.  Patient seems to possess insight as to their current situation.     Labs on Admission: I have personally reviewed following labs and imaging studies -   CBC: Recent Labs  Lab 03/09/21 1110 03/13/21 1320  WBC 13.1* 18.1*  NEUTROABS  --  16.4*  HGB 11.7* 10.5*  HCT 35.2* 31.8*  MCV 98.3 99.1  PLT 231 852   Basic Metabolic Panel: Recent Labs  Lab 03/09/21 1110 03/13/21 1320  NA 130* 131*  K 3.7 3.5  CL 93* 95*  CO2 28 25  GLUCOSE 283* 327*  BUN 18 32*  CREATININE 1.03 1.56*  CALCIUM 8.4* 7.9*   GFR: Estimated Creatinine Clearance: 50.4 mL/min (A) (by C-G formula based on SCr of 1.56 mg/dL (H)). Liver Function Tests: Recent Labs  Lab 03/09/21 1110 03/13/21 1320  AST 11* 10*  ALT 8 8  ALKPHOS 95 91  BILITOT 0.4 0.7  PROT 6.7 6.0*  ALBUMIN 2.9* 2.2*   No results for input(s): LIPASE, AMYLASE in the last 168 hours. No results for input(s): AMMONIA in the last 168 hours. Coagulation Profile: Recent Labs  Lab  03/13/21 1320  INR 1.3*   Cardiac Enzymes: No results for input(s): CKTOTAL, CKMB, CKMBINDEX, TROPONINI in the last 168 hours. BNP (last 3 results) No results for input(s): PROBNP in the last 8760 hours. HbA1C: No results for input(s): HGBA1C in the last 72 hours. CBG: Recent Labs  Lab 03/13/21 1541  GLUCAP 328*   Lipid Profile: No results for input(s): CHOL, HDL, LDLCALC, TRIG, CHOLHDL, LDLDIRECT in  the last 72 hours. Thyroid Function Tests: No results for input(s): TSH, T4TOTAL, FREET4, T3FREE, THYROIDAB in the last 72 hours. Anemia Panel: No results for input(s): VITAMINB12, FOLATE, FERRITIN, TIBC, IRON, RETICCTPCT in the last 72 hours. Urine analysis:    Component Value Date/Time   COLORURINE YELLOW 11/03/2020 [place] 11/03/2020 1418   LABSPEC 1.020 11/03/2020 1418   PHURINE 7.0 11/03/2020 1418   GLUCOSEU NEGATIVE 11/03/2020 1418   HGBUR TRACE (A) 11/03/2020 1418   BILIRUBINUR NEGATIVE 11/03/2020 1418   [place] 11/03/2020 1418   PROTEINUR 100 (A) 11/03/2020 1418   NITRITE NEGATIVE 11/03/2020 1418   LEUKOCYTESUR NEGATIVE 11/03/2020 1418    Radiological Exams on Admission - Personally Reviewed: CT Chest W Contrast  Result Date: 03/13/2021 CLINICAL DATA:  Follow-up right chest wall hematoma and fractures of the right sixth through eighth ribs. Diabetes. HIV. EXAM: CT CHEST WITH CONTRAST TECHNIQUE: Multidetector CT imaging of the chest was performed during intravenous contrast administration. CONTRAST:  1107mL OMNIPAQUE IOHEXOL 300 MG/ML  SOLN COMPARISON:  03/09/2021 FINDINGS: Cardiovascular: Heart size remains normal. No pericardial fluid. Coronary artery calcification as seen previously. Aortic atherosclerotic calcification as seen previously. Mediastinum/Nodes: Normal Lungs/Pleura: Scarring/atelectasis at the lung bases left more than right unchanged since the previous study. Small areas of patchy density in the superior segment of the right  lower lobe unchanged from the study of 4 days ago. No new or progressive pulmonary finding. No pneumothorax. Upper Abdomen: No upper abdominal injury or acute finding. Musculoskeletal: There is increasing size of the right chest wall collections, primarily centered around the right sixth and eighth anterolateral ribs. Those ribs show a pattern of irregular destruction that look more like pathologic fractures due to infection rather than traumatic fractures. Air bubbles are now present within the collections. There is mild extrapleural involvement in side of the ribcage, without frank breakthrough into the pleural space. Most of the collections manifest external to the ribcage. No new areas of involvement are seen. IMPRESSION: Increasing size of the right chest wall collections, primarily centered around the right sixth and eighth anterolateral ribs, with development of air bubbles. Those ribs show a pattern of irregular destruction that have progressed and look more like pathologic fractures due to infection rather than traumatic fractures. Air bubbles are now present within the collections and they probably represent abscesses. There is mild extrapleural involvement on the inner side of the ribcage, without frank breakthrough into the pleural space. The majority of the collections are on the outer side of the ribcage. No new areas of involvement are seen. Case discussed with Dr. Regenia Skeeter at 1419 hours. Aortic Atherosclerosis (ICD10-I70.0). Electronically Signed   By: Nelson Chimes M.D.   On: 03/13/2021 14:19   DG Chest Port 1 View  Result Date: 03/13/2021 CLINICAL DATA:  Sepsis, right rib pain. EXAM: PORTABLE CHEST 1 VIEW COMPARISON:  March 09, 2021. FINDINGS: The heart size and mediastinal contours are within normal limits. Both lungs are clear. No pneumothorax or pleural effusion is noted. The visualized skeletal structures are unremarkable. IMPRESSION: No active disease. Electronically Signed   By: Marijo Conception M.D.   On: 03/13/2021 14:11    EKG: Personally reviewed.  Rhythm is normal sinus rhythm with heart rate of 94 bpm.  No dynamic ST segment changes appreciated.  Assessment/Plan Principal Problem:   Sepsis [facility])   Patient presenting with a several week history of progressively worsening right lateral chest wall pain secondary to concern for chest wall abscess  formation that has developed secondary to recent development of a chest wall hematoma from chest trauma and rib fractures.    Patient is exhibiting multiple SIRS criteria including tachycardia and leukocytosis in the setting of concurrent lactic acidosis and acute kidney injury all suggestive of developing sepsis  Patient is at particular high risk due to underlying diagnosis of HIV.  Furthermore, patient was hospitalized at [facility] in May 2021 for intra-abdominal abscess extending into the thigh urine for infected penile prosthesis.  Penile prosthesis was removed and intraoperative culture grew out MRSA.  Therefore patient is extremely high risk of recurrent MRSA infection at this time.  Images have already been reviewed between the emergency department provider at [facility] and Dr. Kipp Brood with cardiothoracic surgery who is recommending admission to the medicine service for broad-spectrum intravenous antibiotics and IR consultation for drain placement.  Placing patient on intravenous vancomycin and meropenem for broad-spectrum coverage especially considering the patient has a history of MRSA infection in the past  Hydrating patient aggressively with intravenous isotonic fluids  MRSA PCR screening has been ordered  Blood cultures have been obtained  IR consultation has been placed for the morning for evaluation and possible placement of drain  Patient may also benefit from infectious disease consultation in the morning considering history of HIV  Based on clinical course and response to IR  intervention formal consultation with CT surgery may be warranted as well  Active Problems:   AKI (acute kidney injury) ([place])   Patient is suffering from concurrent acute kidney injury secondary to underlying infection and volume depletion  Hydrating patient with intravenous isotonic fluids  Strict input and output monitoring  Monitoring renal function and electrolytes with serial chemistries  Attempting to minimize nephrotoxic agents as much as possible    Lactic acidosis   Lactic acidosis secondary to long depletion and sepsis  Treating patient with broad-spectrum intravenous antibiotic therapy as well as intravenous moderate sedation  Performing serial lactic acid levels to ensure downtrending and resolution    Abscess of right chest wall with adjacent fractures of ribs 6 7 and 8   CT imaging performed at [facility] revealing evidence of chest wall collections with air bubbles in a pattern of irregular destruction of the adjacent ribs  Concern for abscess formation and possibly even adjacent osteomyelitis of the chest wall  Providing patient with intravenous opiate-based analgesics for substantial associated pain.  Providing patient with incentive spirometry.  Please see remainder of assessment and plan above    HIV (human immunodeficiency virus infection) ([place])   Per review of outpatient infectious disease notes at [facility], patient historically has well-controlled HIV with last CD4 count of greater than 500  Continue home regimen of HAART therapy  Diabetes mellitus type 2 without complication not on long-term insulin  . Patient been placed on Accu-Cheks before every meal and nightly with sliding scale insulin . Hemoglobin A1C ordered . Diabetic Diet    Anxiety disorder   Patient is on a rather high dose regimen of benzodiazepines with clonazepam 0.5 mg 4 times daily as confirmed via the controlled substance database.  This  medication has been continued.  Home regimen of BuSpar has additionally been continued.    Mixed diabetic hyperlipidemia associated with type 2 diabetes mellitus ([place])   Continue home regimen of lipid lowering therapy    GERD without esophagitis   Continue home regimen of PPI    BPH (benign prostatic hyperplasia)  Continue home regimen of Flomax  Code Status:  Full code Family Communication: deferred   Status is: Inpatient  Remains inpatient appropriate because:Ongoing diagnostic testing needed not appropriate for outpatient work up, IV treatments appropriate due to intensity of illness or inability to take PO and Inpatient level of care appropriate due to severity of illness   Dispo: The patient is from: Home              Anticipated d/c is to: Home              Patient currently is not medically stable to d/c.   Difficult to place patient No        Vernelle Emerald MD [facility] Pager [phone]  If 7PM-7AM, please contact night-coverage [email or web address] Use universal [place] password for that web site. If you do not have the password, please call the hospital operator.  03/13/2021, 11:20 PM

## 2021-03-13 NOTE — ED Triage Notes (Signed)
Pt states taking ibuprofen or alleve, has oxycodone but little relief

## 2021-03-13 NOTE — ED Notes (Signed)
ED Provider at bedside.  NS IV bolus initiated per verbal request Dr Regenia Skeeter.

## 2021-03-13 NOTE — ED Provider Notes (Signed)
[facility] EMERGENCY DEPARTMENT Provider Note   CSN: [id number] Arrival date & time: 03/13/21  1152     History Chief Complaint  Patient presents with  . Rib Injury    Brian Malone is a 70 y.o. male.  HPI 70 year old male presents with right chest pain.  He injured his ribs and broke them about 4 weeks ago.  He was seen here 4 days ago.  At that point he was given some pain medicine.  Since then his pain has continued to be severe despite the narcotics given.  He continues to have splinting with pain and has to take shallow breaths.  Has had a cough ever since the original injury that is not worse.  No fevers.  However the area of swelling/hematoma has gone inferiorly from his right breast to lower in his chest and now is more swollen and red.  He feels weak and his mouth is dry.  He has not been eating and drinking very well because of nausea/pain.  Past Medical History:  Diagnosis Date  . Anxiety   . Diabetes mellitus without complication ([place])   . HIV (human immunodeficiency virus infection) ([place])   . Hypertension   . Renal disorder     Patient Active Problem List   Diagnosis Date Noted  . Malnutrition of moderate degree 11/05/2020  . Cellulitis of right upper extremity 11/04/2020  . Weakness of right upper extremity 11/04/2020  . Abscess 11/04/2020  . Hypotension 05/26/2020  . CRI (chronic renal insufficiency), stage 3 (moderate) ([place]) 05/26/2020  . CAD (coronary artery disease) 05/26/2020  . History of COVID-19 05/26/2020  . Dizziness 12/26/2019  . Chest pain 11/22/2019  . Acute subdural hematoma ([place]) 10/28/2019  . Subdural hematoma ([place]) 10/26/2019  . Dysphagia 06/20/2019  . AKI (acute kidney injury) ([place]) 06/20/2019  . HCAP (healthcare-associated pneumonia) 06/20/2019  . Normal anion gap metabolic acidosis 92/33/0076  . ARF (acute renal failure) ([place]) 06/19/2019  . Cervical spinal stenosis 05/18/2019  . Lumbar spondylosis 05/18/2019  .  Sacroiliitis ([place]) 05/15/2019  . Right foot ulcer ([place]) 05/13/2019  . HIV (human immunodeficiency virus infection) ([place]) 05/13/2019  . Tachycardia 04/16/2019  . Diabetes ([place]) 04/16/2019  . Cellulitis 04/16/2019  . Wound infection after surgery 04/15/2019  . Benign prostatic hyperplasia (BPH) with straining on urination 02/22/2019  . Essential hypertension 02/22/2019  . Post-traumatic osteoarthritis of both ankles 02/22/2019  . Trigeminal neuralgia 02/22/2019  . Uncontrolled type 2 diabetes mellitus with hyperglycemia ([place]) 02/22/2019  . Chronic bilateral low back pain with bilateral sciatica 01/19/2019  . Diabetic autonomic neuropathy associated with type 2 diabetes mellitus ([place]) 01/19/2019  . Migraine with aura and without status migrainosus, not intractable 01/19/2019  . Neck pain 01/19/2019  . Neuropathy of both feet 01/19/2019  . Nocturia more than twice per night 01/07/2015  . Anxiety 10/23/2014  . Candida, oral 10/23/2014  . Depression 10/23/2014  . Insomnia 10/23/2014  . Rash of entire body 10/23/2014    Past Surgical History:  Procedure Laterality Date  . ANKLE ARTHROSCOPY    . APPENDECTOMY    . BACK SURGERY     FUSION  . PENILE PROSTHESIS  REMOVAL  05/2020   Removal due to abscess  . PENILE PROSTHESIS PLACEMENT  04/2020  . THORACOTOMY Right 2008  ?  . TONSILLECTOMY         Family History  Problem Relation Age of Onset  . CAD Mother   . Lung cancer Mother   . CAD  Father   . Lung cancer Father   . CAD Brother     Social History   Tobacco Use  . Smoking status: Never Smoker  . Smokeless tobacco: Never Used  Vaping Use  . Vaping Use: Never used  Substance Use Topics  . Alcohol use: Not Currently  . Drug use: Never    Home Medications Prior to Admission medications   Medication Sig Start Date End Date Taking? Authorizing Provider  AIMOVIG 140 MG/ML SOAJ Inject 140 mg into the skin every 30 (thirty) days.  09/24/20   [provider]   busPIRone (BUSPAR) 15 MG tablet Take 1 tablet (15 mg total) by mouth 3 (three) times daily. Patient taking differently: Take 30 mg by mouth 3 (three) times daily. 09/03/20   Connye Burkitt, NP  clonazePAM (KLONOPIN) 0.5 MG tablet Take 0.5 mg by mouth 3 (three) times daily. 10/01/19   [provider]  ezetimibe (ZETIA) 10 MG tablet Take 10 mg by mouth daily.    [provider]  isosorbide mononitrate (IMDUR) 60 MG 24 hr tablet TAKE 1 TABLET(60 MG) BY MOUTH DAILY 12/09/20   Elouise Munroe, MD  loratadine (CLARITIN) 10 MG tablet Take 10 mg by mouth daily.     [provider]  pantoprazole (PROTONIX) 40 MG tablet Take 40 mg by mouth daily before breakfast.  02/12/19   [provider]  propranolol ER (INDERAL LA) 60 MG 24 hr capsule Take 60 mg by mouth daily.  08/28/20   [provider]  Semaglutide,0.25 or 0.5MG /DOS, 2 MG/1.5ML SOPN Inject 0.5 mg into the skin every Friday.  10/08/19   [provider]  tamsulosin (FLOMAX) 0.4 MG CAPS capsule Take 0.4 mg by mouth at bedtime.  02/12/19   [provider]  tiZANidine (ZANAFLEX) 4 MG tablet Take 4 mg by mouth at bedtime.  03/08/19   [provider]  traMADol (ULTRAM) 50 MG tablet Take 50 mg by mouth 2 (two) times daily. Patient not taking: Reported on 01/22/2021    [provider]  TRIUMEQ 600-50-300 MG tablet Take 1 tablet by mouth daily.  02/12/19   [provider]  valACYclovir (VALTREX) 1000 MG tablet Take 1,000 mg by mouth 3 (three) times daily as needed (as directed for trigeminal neuralgia flares). Trig neuralgia flare 10/16/19   [provider]    Allergies    Adhesive [tape], Cyclobenzaprine, Duloxetine hcl, and Morphine  Review of Systems   Review of Systems  Constitutional: Negative for fever.  Respiratory: Positive for cough. Negative for shortness of breath.   Cardiovascular: Positive for chest pain.  Gastrointestinal: Negative for abdominal  pain.  Skin: Positive for color change.  All other systems reviewed and are negative.   Physical Exam Updated Vital Signs BP (!) 81/62 (BP Location: Left Arm)   Pulse 74   Temp 98 F (36.7 C) (Oral)   Resp 12   Ht 6' (1.829 m)   Wt 77.1 kg   SpO2 98%   BMI 23.06 kg/m   Physical Exam Vitals and nursing note reviewed.  Constitutional:      Appearance: He is well-developed. He is ill-appearing. He is not diaphoretic.  HENT:     Head: Normocephalic and atraumatic.     Right Ear: External ear normal.     Left Ear: External ear normal.     Nose: Nose normal.  Eyes:     General:        Right eye: No discharge.  Left eye: No discharge.  Cardiovascular:     Rate and Rhythm: Normal rate and regular rhythm.     Heart sounds: Normal heart sounds.  Pulmonary:     Effort: Pulmonary effort is normal.     Breath sounds: Normal breath sounds.  Chest:     Comments: See picture. Large area of erythema that is warm and consistent with cellulitis. Firm and diffusely tender Abdominal:     Palpations: Abdomen is soft.     Tenderness: There is no abdominal tenderness.  Musculoskeletal:     Cervical back: Neck supple.  Skin:    General: Skin is warm and dry.  Neurological:     Mental Status: He is alert.  Psychiatric:        Mood and Affect: Mood is not anxious.       ED Results / Procedures / Treatments   Labs (all labs ordered are listed, but only abnormal results are displayed) Labs Reviewed  COMPREHENSIVE METABOLIC PANEL - Abnormal; Notable for the following components:      Result Value   Sodium 131 (*)    Chloride 95 (*)    Glucose, Bld 327 (*)    BUN 32 (*)    Creatinine, Ser 1.56 (*)    Calcium 7.9 (*)    Total Protein 6.0 (*)    Albumin 2.2 (*)    AST 10 (*)    GFR, Estimated 48 (*)    All other components within normal limits  CBC WITH DIFFERENTIAL/PLATELET - Abnormal; Notable for the following components:   WBC 18.1 (*)    RBC 3.21 (*)    Hemoglobin  10.5 (*)    HCT 31.8 (*)    Neutro Abs 16.4 (*)    Lymphs Abs 0.5 (*)    Abs Immature Granulocytes 0.18 (*)    All other components within normal limits  PROTIME-INR - Abnormal; Notable for the following components:   Prothrombin Time 15.3 (*)    INR 1.3 (*)    All other components within normal limits  APTT - Abnormal; Notable for the following components:   aPTT 37 (*)    All other components within normal limits  RESP PANEL BY RT-PCR (FLU A&B, COVID) ARPGX2  CULTURE, BLOOD (ROUTINE X 2)  CULTURE, BLOOD (ROUTINE X 2)  URINE CULTURE  LACTIC ACID, PLASMA  LACTIC ACID, PLASMA  URINALYSIS, ROUTINE W REFLEX MICROSCOPIC  CBG MONITORING, ED    EKG EKG Interpretation  Date/Time:  Friday March 13 2021 12:44:20 EDT Ventricular Rate:  94 PR Interval:    QRS Duration: 91 QT Interval:  371 QTC Calculation: 464 R Axis:   -18 Text Interpretation: Sinus rhythm Atrial premature complexes Borderline left axis deviation Confirmed by Sherwood Gambler [phone]) on 03/13/2021 1:05:36 PM   Radiology CT Chest W Contrast  Result Date: 03/13/2021 CLINICAL DATA:  Follow-up right chest wall hematoma and fractures of the right sixth through eighth ribs. Diabetes. HIV. EXAM: CT CHEST WITH CONTRAST TECHNIQUE: Multidetector CT imaging of the chest was performed during intravenous contrast administration. CONTRAST:  148mL OMNIPAQUE IOHEXOL 300 MG/ML  SOLN COMPARISON:  03/09/2021 FINDINGS: Cardiovascular: Heart size remains normal. No pericardial fluid. Coronary artery calcification as seen previously. Aortic atherosclerotic calcification as seen previously. Mediastinum/Nodes: Normal Lungs/Pleura: Scarring/atelectasis at the lung bases left more than right unchanged since the previous study. Small areas of patchy density in the superior segment of the right lower lobe unchanged from the study of 4 days ago. No new or progressive  pulmonary finding. No pneumothorax. Upper Abdomen: No upper abdominal injury or acute  finding. Musculoskeletal: There is increasing size of the right chest wall collections, primarily centered around the right sixth and eighth anterolateral ribs. Those ribs show a pattern of irregular destruction that look more like pathologic fractures due to infection rather than traumatic fractures. Air bubbles are now present within the collections. There is mild extrapleural involvement in side of the ribcage, without frank breakthrough into the pleural space. Most of the collections manifest external to the ribcage. No new areas of involvement are seen. IMPRESSION: Increasing size of the right chest wall collections, primarily centered around the right sixth and eighth anterolateral ribs, with development of air bubbles. Those ribs show a pattern of irregular destruction that have progressed and look more like pathologic fractures due to infection rather than traumatic fractures. Air bubbles are now present within the collections and they probably represent abscesses. There is mild extrapleural involvement on the inner side of the ribcage, without frank breakthrough into the pleural space. The majority of the collections are on the outer side of the ribcage. No new areas of involvement are seen. Case discussed with Dr. Regenia Skeeter at 1419 hours. Aortic Atherosclerosis (ICD10-I70.0). Electronically Signed   By: Nelson Chimes M.D.   On: 03/13/2021 14:19   DG Chest Port 1 View  Result Date: 03/13/2021 CLINICAL DATA:  Sepsis, right rib pain. EXAM: PORTABLE CHEST 1 VIEW COMPARISON:  March 09, 2021. FINDINGS: The heart size and mediastinal contours are within normal limits. Both lungs are clear. No pneumothorax or pleural effusion is noted. The visualized skeletal structures are unremarkable. IMPRESSION: No active disease. Electronically Signed   By: Marijo Conception M.D.   On: 03/13/2021 14:11    Procedures .Critical Care Performed by: Sherwood Gambler, MD Authorized by: Sherwood Gambler, MD   Critical care  provider statement:    Critical care time (minutes):  40   Critical care time was exclusive of:  Separately billable procedures and treating other patients   Critical care was necessary to treat or prevent imminent or life-threatening deterioration of the following conditions:  Sepsis   Critical care was time spent personally by me on the following activities:  Discussions with consultants, evaluation of patient's response to treatment, examination of patient, ordering and performing treatments and interventions, ordering and review of laboratory studies, ordering and review of radiographic studies, pulse oximetry, re-evaluation of patient's condition, obtaining history from patient or surrogate and review of old charts     Medications Ordered in ED Medications  lactated ringers infusion (has no administration in time range)  lactated ringers bolus 1,000 mL (has no administration in time range)    And  lactated ringers bolus 500 mL (has no administration in time range)  sodium chloride 0.9 % bolus 1,000 mL (has no administration in time range)  vancomycin (VANCOCIN) IVPB 1000 mg/200 mL premix (has no administration in time range)  piperacillin-tazobactam (ZOSYN) IVPB 3.375 g (has no administration in time range)  fentaNYL (SUBLIMAZE) injection 50 mcg (has no administration in time range)    ED Course  I have reviewed the triage vital signs and the nursing notes.  Pertinent labs & imaging results that were available during my care of the patient were reviewed by me and considered in my medical decision making (see chart for details).    MDM Rules/Calculators/A&P  Patient presents with chest wall abscess. This is probably what was seen on CT 4 days ago and previously diagnosed as hematoma. While he has good control of HIV and Hep C, he also has diabetes and is thus immunocompromised. Given his initial multiple BPs in the 70s/80s, he meets criteria for septic shock.  Given broad antibiotics, as well as clindamycin on top to cover for necrotizing/gas-forming deep space infection.  Fortunately his vitals have stabilized.  I discussed with CT surgery, Dr. Kipp Brood, who recommends tube drainage via IR and medical admission. Consult him again when he gets to [facility]. D/w Dr. Roosevelt Locks who accepts for admission.  Final Clinical Impression(s) / ED Diagnoses Final diagnoses:  Septic shock ([place])  Abscess of chest ([place])  Acute kidney injury [facility])    Rx / DC Orders ED Discharge Orders    None       Sherwood Gambler, MD 03/13/21 1539

## 2021-03-14 ENCOUNTER — Inpatient Hospital Stay (HOSPITAL_COMMUNITY): Payer: Medicare Other

## 2021-03-14 ENCOUNTER — Encounter (HOSPITAL_COMMUNITY): Payer: Self-pay | Admitting: Internal Medicine

## 2021-03-14 DIAGNOSIS — A419 Sepsis, unspecified organism: Secondary | ICD-10-CM | POA: Diagnosis not present

## 2021-03-14 DIAGNOSIS — N179 Acute kidney failure, unspecified: Secondary | ICD-10-CM | POA: Diagnosis not present

## 2021-03-14 DIAGNOSIS — R6520 Severe sepsis without septic shock: Secondary | ICD-10-CM | POA: Diagnosis not present

## 2021-03-14 HISTORY — PX: IR US GUIDE BX ASP/DRAIN: IMG2392

## 2021-03-14 LAB — COMPREHENSIVE METABOLIC PANEL
ALT: 8 U/L (ref 0–44)
AST: 10 U/L — ABNORMAL LOW (ref 15–41)
Albumin: 1.9 g/dL — ABNORMAL LOW (ref 3.5–5.0)
Alkaline Phosphatase: 82 U/L (ref 38–126)
Anion gap: 7 (ref 5–15)
BUN: 24 mg/dL — ABNORMAL HIGH (ref 8–23)
CO2: 28 mmol/L (ref 22–32)
Calcium: 8.1 mg/dL — ABNORMAL LOW (ref 8.9–10.3)
Chloride: 99 mmol/L (ref 98–111)
Creatinine, Ser: 1.21 mg/dL (ref 0.61–1.24)
GFR, Estimated: >60 mL/min (ref >60)
Glucose, Bld: 157 mg/dL — ABNORMAL HIGH (ref 70–99)
Potassium: 3.5 mmol/L (ref 3.5–5.1)
Sodium: 134 mmol/L — ABNORMAL LOW (ref 135–145)
Total Bilirubin: 1.1 mg/dL (ref 0.3–1.2)
Total Protein: 5.5 g/dL — ABNORMAL LOW (ref 6.5–8.1)

## 2021-03-14 LAB — CBC WITH DIFFERENTIAL/PLATELET
Abs Immature Granulocytes: 0.17 10*3/uL — ABNORMAL HIGH (ref 0.00–0.07)
Basophils Absolute: 0 10*3/uL (ref 0.0–0.1)
Basophils Relative: 0 %
Eosinophils Absolute: 0.1 10*3/uL (ref 0.0–0.5)
Eosinophils Relative: 0 %
HCT: 29.8 % — ABNORMAL LOW (ref 39.0–52.0)
Hemoglobin: 10 g/dL — ABNORMAL LOW (ref 13.0–17.0)
Immature Granulocytes: 1 %
Lymphocytes Relative: 2 %
Lymphs Abs: 0.4 10*3/uL — ABNORMAL LOW (ref 0.7–4.0)
MCH: 32.9 pg (ref 26.0–34.0)
MCHC: 33.6 g/dL (ref 30.0–36.0)
MCV: 98 fL (ref 80.0–100.0)
Monocytes Absolute: 0.7 10*3/uL (ref 0.1–1.0)
Monocytes Relative: 4 %
Neutro Abs: 14.5 10*3/uL — ABNORMAL HIGH (ref 1.7–7.7)
Neutrophils Relative %: 93 %
Platelets: 219 10*3/uL (ref 150–400)
RBC: 3.04 MIL/uL — ABNORMAL LOW (ref 4.22–5.81)
RDW: 12.8 % (ref 11.5–15.5)
WBC: 15.9 10*3/uL — ABNORMAL HIGH (ref 4.0–10.5)
nRBC: 0 % (ref 0.0–0.2)

## 2021-03-14 LAB — HEMOGLOBIN A1C
Hgb A1c MFr Bld: 8.3 % — ABNORMAL HIGH (ref 4.8–5.6)
Mean Plasma Glucose: 191.51 mg/dL

## 2021-03-14 LAB — PROTIME-INR
INR: 1.3 — ABNORMAL HIGH (ref 0.8–1.2)
Prothrombin Time: 15.9 seconds — ABNORMAL HIGH (ref 11.4–15.2)

## 2021-03-14 LAB — LACTIC ACID, PLASMA: Lactic Acid, Venous: 1.8 mmol/L (ref 0.5–1.9)

## 2021-03-14 LAB — GLUCOSE, CAPILLARY
Glucose-Capillary: 161 mg/dL — ABNORMAL HIGH (ref 70–99)
Glucose-Capillary: 108 mg/dL — ABNORMAL HIGH (ref 70–99)
Glucose-Capillary: 107 mg/dL — ABNORMAL HIGH (ref 70–99)
Glucose-Capillary: 130 mg/dL — ABNORMAL HIGH (ref 70–99)
Glucose-Capillary: 94 mg/dL (ref 70–99)

## 2021-03-14 LAB — MAGNESIUM: Magnesium: 1.4 mg/dL — ABNORMAL LOW (ref 1.7–2.4)

## 2021-03-14 LAB — CORTISOL-AM, BLOOD: Cortisol - AM: 12.1 ug/dL (ref 6.7–22.6)

## 2021-03-14 LAB — PROCALCITONIN: Procalcitonin: 0.56 ng/mL

## 2021-03-14 LAB — MRSA PCR SCREENING: MRSA by PCR: POSITIVE — AB

## 2021-03-14 MED ORDER — CLONAZEPAM 0.25 MG PO TBDP
0.2500 mg | ORAL_TABLET | Freq: Two times a day (BID) | ORAL | Status: DC
  Administered 2021-03-14 – 2021-03-16 (×4): 0.25 mg via ORAL
  Filled 2021-03-14 (×5): qty 1

## 2021-03-14 MED ORDER — FENTANYL CITRATE (PF) 100 MCG/2ML IJ SOLN
INTRAMUSCULAR | Status: AC
  Filled 2021-03-14: qty 2

## 2021-03-14 MED ORDER — FENTANYL CITRATE (PF) 100 MCG/2ML IJ SOLN
INTRAMUSCULAR | Status: AC | PRN
  Administered 2021-03-14 (×4): 25 ug via INTRAVENOUS

## 2021-03-14 MED ORDER — NALOXONE HCL 0.4 MG/ML IJ SOLN: 0.4000 mg | INTRAMUSCULAR | Status: DC | PRN

## 2021-03-14 MED ORDER — TRAMADOL HCL 50 MG PO TABS
50.0000 mg | ORAL_TABLET | Freq: Four times a day (QID) | ORAL | Status: DC | PRN
  Administered 2021-03-15: 50 mg via ORAL
  Filled 2021-03-14 (×3): qty 1

## 2021-03-14 MED ORDER — TIZANIDINE HCL 4 MG PO TABS
2.0000 mg | ORAL_TABLET | Freq: Every day | ORAL | Status: DC
  Administered 2021-03-14 – 2021-04-09 (×27): 2 mg via ORAL
  Filled 2021-03-14 (×27): qty 1

## 2021-03-14 MED ORDER — VALACYCLOVIR HCL 500 MG PO TABS: 1000.0000 mg | ORAL_TABLET | Freq: Three times a day (TID) | ORAL | Status: DC | PRN

## 2021-03-14 MED ORDER — LIDOCAINE HCL 1 % IJ SOLN
INTRAMUSCULAR | Status: AC | PRN
Start: 2021-03-14 — End: 2021-03-14
  Administered 2021-03-14: 5 mL

## 2021-03-14 MED ORDER — ISOSORBIDE MONONITRATE ER 30 MG PO TB24
30.0000 mg | ORAL_TABLET | Freq: Every day | ORAL | Status: DC
  Administered 2021-03-14 – 2021-03-19 (×6): 30 mg via ORAL
  Filled 2021-03-14 (×5): qty 1

## 2021-03-14 MED ORDER — CHLORHEXIDINE GLUCONATE CLOTH 2 % EX PADS
6.0000 | MEDICATED_PAD | Freq: Every day | CUTANEOUS | Status: AC
  Administered 2021-03-14 – 2021-03-18 (×5): 6 via TOPICAL

## 2021-03-14 MED ORDER — POTASSIUM CHLORIDE CRYS ER 20 MEQ PO TBCR
40.0000 meq | EXTENDED_RELEASE_TABLET | Freq: Once | ORAL | Status: AC
  Administered 2021-03-14: 40 meq via ORAL
  Filled 2021-03-14: qty 2

## 2021-03-14 MED ORDER — MIDAZOLAM HCL 2 MG/2ML IJ SOLN
INTRAMUSCULAR | Status: AC | PRN
  Administered 2021-03-14 (×4): 0.5 mg via INTRAVENOUS

## 2021-03-14 MED ORDER — GLUCERNA SHAKE PO LIQD
237.0000 mL | Freq: Three times a day (TID) | ORAL | Status: DC
  Administered 2021-03-15 – 2021-03-18 (×9): 237 mL via ORAL

## 2021-03-14 MED ORDER — MIDAZOLAM HCL 2 MG/2ML IJ SOLN
INTRAMUSCULAR | Status: AC
  Filled 2021-03-14: qty 4

## 2021-03-14 MED ORDER — MAGNESIUM SULFATE 2 GM/50ML IV SOLN
2.0000 g | Freq: Once | INTRAVENOUS | Status: AC
  Administered 2021-03-14: 2 g via INTRAVENOUS
  Filled 2021-03-14: qty 50

## 2021-03-14 MED ORDER — ADULT MULTIVITAMIN W/MINERALS CH
1.0000 | ORAL_TABLET | Freq: Every day | ORAL | Status: DC
  Administered 2021-03-14 – 2021-04-09 (×27): 1 via ORAL
  Filled 2021-03-14 (×28): qty 1

## 2021-03-14 MED ORDER — HEPARIN SODIUM (PORCINE) 5000 UNIT/ML IJ SOLN
5000.0000 [IU] | Freq: Three times a day (TID) | INTRAMUSCULAR | Status: DC
  Filled 2021-03-14 (×6): qty 1

## 2021-03-14 MED ORDER — MUPIROCIN 2 % EX OINT
1.0000 "application " | TOPICAL_OINTMENT | Freq: Two times a day (BID) | CUTANEOUS | Status: AC
  Administered 2021-03-14 – 2021-03-18 (×10): 1 via NASAL
  Filled 2021-03-14: qty 22

## 2021-03-14 MED ORDER — HYDROMORPHONE HCL 1 MG/ML IJ SOLN
0.5000 mg | INTRAMUSCULAR | Status: DC | PRN
  Administered 2021-03-14 – 2021-03-18 (×16): 0.5 mg via INTRAVENOUS
  Filled 2021-03-14 (×16): qty 0.5

## 2021-03-14 MED ORDER — LACTATED RINGERS IV SOLN: INTRAVENOUS | Status: AC

## 2021-03-14 MED ORDER — TRAMADOL HCL 50 MG PO TABS: 50.0000 mg | ORAL_TABLET | Freq: Two times a day (BID) | ORAL | Status: DC

## 2021-03-14 MED ORDER — CLONAZEPAM 0.5 MG PO TABS: 0.2500 mg | ORAL_TABLET | Freq: Two times a day (BID) | ORAL | Status: DC

## 2021-03-14 MED ORDER — LIDOCAINE HCL 1 % IJ SOLN
INTRAMUSCULAR | Status: AC
  Filled 2021-03-14: qty 20

## 2021-03-14 NOTE — H&P (Signed)
Chief Complaint: Chest wall fluid collection  Referring Physician(s): Thurnell Lose, MD  Supervising Physician: Markus Daft  Patient Status: [facility] - In-pt  History of Present Illness: Brian Malone is a 70 y.o. male with medical issues including hepatitis C, hypertension, hyperlipidemia, diabetes mellitus type 2, chronic pain syndrome, intravenous drug use (abstinent since 2019), major depressive disorder and HIV.  On March 6 he suffered a fall and landed directly on his right chest.    He was seen by his primary care provider 2 days later on 3/8 but imaging surprisingly revealed no evidence of rib fractures.  Due to persistent debilitating pain, he presented to a local urgent care clinic on 3/11 for repeat evaluation where x-ray was repeated of the ribs and this time revealed fractures of ribs 6, 7 and 8.  He was prescribed some analgesics and discharged home at that time.  His pain continued to worsen so he presented to [place] emergency department on 3/21 where CT imaging of the chest was performed in addition to revealing the known fractures of ribs 6 7 and 8 there is identification of a chest wall hematoma and pulmonary contusion.  Patient again was discharged home with additional home-going opiate-based analgesics.  On 3/25 he again presented to [place] emergency department,  CT showed developing chest wall collections with air bubbles surrounding the broken ribs concerning for abscess formations.   He is now in [facility] as a transfer from [facility] for continued right chest wall pain.  We are asked to evaluate for image guided aspiration with possible drain placement.  He is NPO.       Past Medical History:  Diagnosis Date  . Acute subdural hematoma ([place]) 10/28/2019  . Anxiety   . Cellulitis of right upper extremity 11/04/2020  . Diabetes mellitus without complication ([place])   . History of COVID-19  05/26/2020  . HIV (human immunodeficiency virus infection) ([place])   . Hypertension   . Renal disorder     Past Surgical History:  Procedure Laterality Date  . ANKLE ARTHROSCOPY    . APPENDECTOMY    . BACK SURGERY     FUSION  . PENILE PROSTHESIS  REMOVAL  05/2020   Removal due to abscess  . PENILE PROSTHESIS PLACEMENT  04/2020  . THORACOTOMY Right 2008  ?  . TONSILLECTOMY      Allergies: Adhesive [tape], Cyclobenzaprine, Duloxetine hcl, and Morphine  Medications: Prior to Admission medications   Medication Sig Start Date End Date Taking? Authorizing Provider  AIMOVIG 140 MG/ML SOAJ Inject 140 mg into the skin every 30 (thirty) days.  09/24/20   [provider]  busPIRone (BUSPAR) 15 MG tablet Take 1 tablet (15 mg total) by mouth 3 (three) times daily. Patient taking differently: Take 30 mg by mouth 3 (three) times daily. 09/03/20   Connye Burkitt, NP  clonazePAM (KLONOPIN) 0.5 MG tablet Take 0.5 mg by mouth 3 (three) times daily. 10/01/19   [provider]  ezetimibe (ZETIA) 10 MG tablet Take 10 mg by mouth daily.    [provider]  isosorbide mononitrate (IMDUR) 60 MG 24 hr tablet TAKE 1 TABLET(60 MG) BY MOUTH DAILY 12/09/20   Elouise Munroe, MD  loratadine (CLARITIN) 10 MG tablet Take 10 mg by mouth daily.     [provider]  pantoprazole (PROTONIX) 40 MG tablet Take 40 mg by mouth daily before breakfast.  02/12/19  [provider]  propranolol ER (INDERAL LA) 60 MG 24 hr capsule Take 60 mg by mouth daily.  08/28/20   [provider]  Semaglutide,0.25 or 0.5MG /DOS, 2 MG/1.5ML SOPN Inject 0.5 mg into the skin every Friday.  10/08/19   [provider]  tamsulosin (FLOMAX) 0.4 MG CAPS capsule Take 0.4 mg by mouth at bedtime.  02/12/19   [provider]  tiZANidine (ZANAFLEX) 4 MG tablet Take 4 mg by mouth at bedtime.  03/08/19   [provider]  traMADol (ULTRAM) 50 MG tablet Take 50 mg by mouth 2 (two)  times daily. Patient not taking: Reported on 01/22/2021    [provider]  TRIUMEQ 600-50-300 MG tablet Take 1 tablet by mouth daily.  02/12/19   [provider]  valACYclovir (VALTREX) 1000 MG tablet Take 1,000 mg by mouth 3 (three) times daily as needed (as directed for trigeminal neuralgia flares). Trig neuralgia flare 10/16/19   [provider]     Family History  Problem Relation Age of Onset  . CAD Mother   . Lung cancer Mother   . CAD Father   . Lung cancer Father   . CAD Brother     Social History   Socioeconomic History  . Marital status: Divorced    Spouse name: Not on file  . Number of children: 2  . Years of education: Not on file  . Highest education level: Bachelor's degree (e.g., BA, AB, BS)  Occupational History  . Not on file  Tobacco Use  . Smoking status: Never Smoker  . Smokeless tobacco: Never Used  Vaping Use  . Vaping Use: Never used  Substance and Sexual Activity  . Alcohol use: Not Currently  . Drug use: Never  . Sexual activity: Not on file  Other Topics Concern  . Not on file  Social History Narrative   Pt is divorced   2 children   Right handed   Drinks soda, no coffee, no tea    Lives on the third floor of an apartment bldg. One story studio apartment   Social Determinants of Health   Financial Resource Strain: Not on file  Food Insecurity: Not on file  Transportation Needs: Not on file  Physical Activity: Not on file  Stress: Not on file  Social Connections: Not on file     Review of Systems: A 12 point ROS discussed and pertinent positives are indicated in the HPI above.  All other systems are negative.  Review of Systems  Vital Signs: BP 96/65 (BP Location: Left Arm)   Pulse (!) 107   Temp 98.3 F (36.8 C) (Oral)   Resp 12   Ht 6' (1.829 m)   Wt 80.7 kg   SpO2 97%   BMI 24.13 kg/m   Physical Exam Vitals reviewed.  Constitutional:      Appearance: Normal appearance.  HENT:     Head:  Normocephalic and atraumatic.  Eyes:     Extraocular Movements: Extraocular movements intact.  Cardiovascular:     Rate and Rhythm: Regular rhythm. Tachycardia present.  Pulmonary:     Effort: Pulmonary effort is normal. No respiratory distress.  Abdominal:     General: There is no distension.     Palpations: Abdomen is soft.     Tenderness: There is no abdominal tenderness.       Comments: Large fluid collection right chest wall. Very tender to palpation.  Musculoskeletal:        General: Normal  range of motion.     Cervical back: Normal range of motion.  Skin:    General: Skin is warm and dry.  Neurological:     General: No focal deficit present.     Mental Status: He is alert and oriented to person, place, and time.  Psychiatric:        Mood and Affect: Mood normal.        Behavior: Behavior normal.        Thought Content: Thought content normal.        Judgment: Judgment normal.     Imaging: CT Chest W Contrast  Result Date: 03/13/2021 CLINICAL DATA:  Follow-up right chest wall hematoma and fractures of the right sixth through eighth ribs. Diabetes. HIV. EXAM: CT CHEST WITH CONTRAST TECHNIQUE: Multidetector CT imaging of the chest was performed during intravenous contrast administration. CONTRAST:  122mL OMNIPAQUE IOHEXOL 300 MG/ML  SOLN COMPARISON:  03/09/2021 FINDINGS: Cardiovascular: Heart size remains normal. No pericardial fluid. Coronary artery calcification as seen previously. Aortic atherosclerotic calcification as seen previously. Mediastinum/Nodes: Normal Lungs/Pleura: Scarring/atelectasis at the lung bases left more than right unchanged since the previous study. Small areas of patchy density in the superior segment of the right lower lobe unchanged from the study of 4 days ago. No new or progressive pulmonary finding. No pneumothorax. Upper Abdomen: No upper abdominal injury or acute finding. Musculoskeletal: There is increasing size of the right chest wall  collections, primarily centered around the right sixth and eighth anterolateral ribs. Those ribs show a pattern of irregular destruction that look more like pathologic fractures due to infection rather than traumatic fractures. Air bubbles are now present within the collections. There is mild extrapleural involvement in side of the ribcage, without frank breakthrough into the pleural space. Most of the collections manifest external to the ribcage. No new areas of involvement are seen. IMPRESSION: Increasing size of the right chest wall collections, primarily centered around the right sixth and eighth anterolateral ribs, with development of air bubbles. Those ribs show a pattern of irregular destruction that have progressed and look more like pathologic fractures due to infection rather than traumatic fractures. Air bubbles are now present within the collections and they probably represent abscesses. There is mild extrapleural involvement on the inner side of the ribcage, without frank breakthrough into the pleural space. The majority of the collections are on the outer side of the ribcage. No new areas of involvement are seen. Case discussed with Dr. Regenia Skeeter at 1419 hours. Aortic Atherosclerosis (ICD10-I70.0). Electronically Signed   By: Nelson Chimes M.D.   On: 03/13/2021 14:19   CT Chest W Contrast  Result Date: 03/09/2021 CLINICAL DATA:  Fall 3 weeks prior with right rib fractures and persistent chest pain. EXAM: CT CHEST WITH CONTRAST TECHNIQUE: Multidetector CT imaging of the chest was performed during intravenous contrast administration. CONTRAST:  67mL OMNIPAQUE IOHEXOL 300 MG/ML  SOLN COMPARISON:  Chest radiograph from earlier today. 11/22/2019 chest CT angiogram. FINDINGS: Cardiovascular: Normal heart size. No significant pericardial effusion/thickening. Three-vessel coronary atherosclerosis. Atherosclerotic nonaneurysmal thoracic aorta. Top-normal caliber main pulmonary artery (3.0 cm diameter). No  central pulmonary emboli. Mediastinum/Nodes: No discrete thyroid nodules. Unremarkable esophagus. No pathologically enlarged axillary, mediastinal or hilar lymph nodes. Lungs/Pleura: No pneumothorax. No pleural effusion. No acute consolidative airspace disease, lung masses or significant pulmonary nodules. Mild platelike atelectasis in the anterior basilar left lower lobe. Mild patchy ground-glass opacity in peripheral posterior right mid lung. Upper abdomen: No acute abnormality. Musculoskeletal: No aggressive appearing focal  osseous lesions. Nondisplaced acute anterior right sixth, seventh and eighth rib fractures with surrounding chest wall hematoma measuring up to 8.1 x 4.9 cm in maximum axial dimensions at the level of the anterior right sixth rib fracture (series 2/image 114). Moderate thoracic spondylosis. IMPRESSION: 1. Nondisplaced acute anterior right sixth, seventh and eighth rib fractures with surrounding chest wall hematoma. No pneumothorax or hemothorax. No discrete bone lesions. Clinical follow-up advised to ensure resolution of the right chest wall hematoma. Any need for follow-up imaging should be based on clinical assessment. 2. Mild patchy ground-glass opacity in the peripheral posterior right mid lung, nonspecific, favor mild pulmonary contusion. 3. Mild platelike atelectasis at the left lung base. 4. Three-vessel coronary atherosclerosis. 5. Aortic Atherosclerosis (ICD10-I70.0). Electronically Signed   By: Ilona Sorrel M.D.   On: 03/09/2021 13:03   DG Chest Port 1 View  Result Date: 03/13/2021 CLINICAL DATA:  Sepsis, right rib pain. EXAM: PORTABLE CHEST 1 VIEW COMPARISON:  March 09, 2021. FINDINGS: The heart size and mediastinal contours are within normal limits. Both lungs are clear. No pneumothorax or pleural effusion is noted. The visualized skeletal structures are unremarkable. IMPRESSION: No active disease. Electronically Signed   By: Marijo Conception M.D.   On: 03/13/2021 14:11   DG  Chest Portable 1 View  Result Date: 03/09/2021 CLINICAL DATA:  Chest pain. Chest Arctic hurting late yesterday. Complains of right-sided sternal pain and chest pain. EXAM: PORTABLE CHEST 1 VIEW COMPARISON:  None. FINDINGS: The heart size and mediastinal contours are within normal limits. Both lungs are clear. Blunting of the left costophrenic angle is noted, new from prior studies. The visualized skeletal structures are unremarkable. IMPRESSION: New blunting of left costophrenic angle may reflect small effusion. The lungs are otherwise clear. No signs of interstitial edema. Electronically Signed   By: Kerby Moors M.D.   On: 03/09/2021 11:26    Labs:  CBC: Recent Labs    11/05/20 0319 03/09/21 1110 03/13/21 1320 03/14/21 0038  WBC 6.9 13.1* 18.1* 15.9*  HGB 13.0 11.7* 10.5* 10.0*  HCT 38.8* 35.2* 31.8* 29.8*  PLT 142* 231 262 219    COAGS: Recent Labs    11/03/20 1355 03/13/21 1320 03/14/21 0038  INR 1.0 1.3* 1.3*  APTT  --  37*  --     BMP: Recent Labs    06/02/20 2257 06/18/20 1918 09/01/20 2000 10/22/20 1629 11/05/20 0319 03/09/21 1110 03/13/21 1320 03/14/21 0038  NA 144 139 139   < > 135 130* 131* 134*  K 3.4* 4.2 4.3   < > 4.2 3.7 3.5 3.5  CL 109 109 101   < > 100 93* 95* 99  CO2 25 21* 29   < > 25 28 25 28   GLUCOSE 114* 102* 190*   < > 185* 283* 327* 157*  BUN 23 16 14    < > 19 18 32* 24*  CALCIUM 8.6* 8.8* 8.4*   < > 8.9 8.4* 7.9* 8.1*  CREATININE 1.30* 1.45* 1.15   < > 1.14 1.03 1.56* 1.21  GFRNONAA 57* 50* >60   < > >60 >60 48* >60  GFRAA >60 58* >60  --   --   --   --   --    < > = values in this interval not displayed.    LIVER FUNCTION TESTS: Recent Labs    11/03/20 1355 03/09/21 1110 03/13/21 1320 03/14/21 0038  BILITOT 0.9 0.4 0.7 1.1  AST 40 11* 10* 10*  ALT 45*  8 8 8   ALKPHOS 104 95 91 82  PROT 7.2 6.7 6.0* 5.5*  ALBUMIN 3.4* 2.9* 2.2* 1.9*    TUMOR MARKERS: No results for input(s): AFPTM, CEA, CA199, CHROMGRNA in the last 8760  hours.  Assessment and Plan:  Right chest wall fluid collection concerning for infected hematoma/abscess formation.  Images reviewed by Dr. Anselm [name].  Will proceed with image guided aspiration, possible drain placement by Dr. Anselm [name] today.  Risks and benefits discussed with the patient including bleeding, infection, damage to adjacent structures, and sepsis.  All of the patient's questions were answered, patient is agreeable to proceed. Consent signed and in chart.  Thank you for this interesting consult.  I greatly enjoyed meeting MEHMET SCALLY and look forward to participating in their care.  A copy of this report was sent to the requesting provider on this date.  Electronically Signed: Murrell Redden, PA-C   03/14/2021, 9:25 AM      I spent a total of 20 Minutes in face to face in clinical consultation, greater than 50% of which was counseling/coordinating care for aspiration/drain chest wall fluid collection.

## 2021-03-14 NOTE — Procedures (Signed)
Interventional Radiology Procedure:   Indications: Infected hematomas in right anterior chest.  Right rib fractures.  Procedure: US guided drain placement X 2   Findings: Complex hematomas in right anterior chest and right upper abdomen.  Purulent fluid was aspirated from 2 separate areas and two 10 Fr drains placed.  Complications: None     EBL: minimal, less than 10 ml  Plan: Send fluid sample for culture.  Follow output.  Not sure the collections will adequate decompress with only drainage catheters and may need surgical evacuation at some point.     Brian Monarch R. Anselm [name], MD  Pager: [phone]
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
## 2021-03-14 NOTE — Plan of Care (Signed)
°  Problem: Education: °Goal: Knowledge of General Education information will improve °Description: Including pain rating scale, medication(s)/side effects and non-pharmacologic comfort measures °Outcome: Progressing °  °Problem: Health Behavior/Discharge Planning: °Goal: Ability to manage health-related needs will improve °Outcome: Progressing °  °Problem: Clinical Measurements: °Goal: Ability to maintain clinical measurements within normal limits will improve °Outcome: Progressing °Goal: Respiratory complications will improve °Outcome: Progressing °  °Problem: Activity: °Goal: Risk for activity intolerance will decrease °Outcome: Progressing °  °Problem: Pain Managment: °Goal: General experience of comfort will improve °Outcome: Progressing °  °

## 2021-03-14 NOTE — Progress Notes (Deleted)
Pt in IR 

## 2021-03-14 NOTE — Progress Notes (Signed)
PROGRESS NOTE                                                                                                                                                                                                             Patient Demographics:    Brian Malone, is a 70 y.o. male, DOB - April 12, 1951, GQB:[id number]  Outpatient Primary MD for the patient is Brian Malone, [name], MD    LOS - 1  Admit date - 03/13/2021    Chief Complaint  Patient presents with  . Rib Injury       Brief Narrative (HPI from H&P)  70 year old male with past medical history of hepatitis C, hypertension, hyperlipidemia, diabetes mellitus type 2, chronic pain syndrome, intravenous drug use (abstinent since 2019), major depressive disorder and HIV who had a trip and fall in his yard on 02/22/2021 after which he hurt his right-sided chest wall, at that time he was diagnosed with right-sided rib cage fracture and injury, he was seen in the ER a few times since then and was treated conservatively, his pain and discomfort continued to get worse and he presented again to [facility] on  03/13/2021 CT scan suggested that he had right chest wall abscess formation and cellulitis with concerns for sepsis.   Subjective:    Brian Malone today has, No headache, No shortness of breath, continues to have right-sided rib cage pain and discomfort, no abdominal pain no nausea   Assessment  & Plan :     1. Sepsis due to right-sided chest wall abscess formation after mechanical fall and multiple right rib fractures sustained on 02/22/2021 - he being treated with empiric IV antibiotics along with IV fluids, currently sepsis pathophysiology has improved, Dr. Kipp Brood cardiothoracic surgery was consulted in the ER who suggested ultrasound-guided drainage of the abscess by IR, this will be done on 03/14/2021, continue to monitor closely.  Avoid overuse of  narcotics  Encouraged the patient to sit up in chair in the daytime use I-S and flutter valve for pulmonary toiletry.  Will advance activity and titrate down oxygen as possible.  SpO2: 95 %   2.  Chronic pain and narcotic use.  Supportive care as needed for acute discomfort, avoid overuse, as needed Narcan added.  Patient counseled.  3.  Continue home regimen follows with [facility] ID  department.  4.  Anxiety and depression.  Home medications continued.  Currently not homicidal or suicidal.  5.  GERD.  On PPI.  6.  BPH.  On Flomax.  7.  AKI.  Improved after IV fluids likely due to ATN from sepsis.  8.  Hypertension.  Blood pressure soft, skipped today's blood pressure medications will monitor closely.   9.  DM type II.  On sliding scale monitor and adjust  Lab Results  Component Value Date   HGBA1C 8.3 (H) 03/14/2021   CBG (last 3)  Recent Labs    03/13/21 1541 03/13/21 2206 03/14/21 0824  GLUCAP 328* 161* 108*          Condition - Extremely Guarded  Family Communication  : None present bedside  Code Status :  Full  Consults  :  IR  PUD Prophylaxis : PPI   Procedures  :     CT -  Increasing size of the right chest wall collections, primarily centered around the right sixth and eighth anterolateral ribs, with development of air bubbles. Those ribs show a pattern of irregular destruction that have progressed and look more like pathologic fractures due to infection rather than traumatic fractures. Air bubbles are now present within the collections and they probably represent abscesses. There is mild extrapleural involvement on the inner side of the ribcage, without frank breakthrough into the pleural space. The majority of the collections are on the outer side of the ribcage. No new areas of involvement are seen.      Disposition Plan  :    Status is: Inpatient  Remains inpatient appropriate because:IV treatments appropriate due to intensity of illness or  inability to take PO   Dispo: The patient is from: Home              Anticipated d/c is to: Home              Patient currently is not medically stable to d/c.   Difficult to place patient No  DVT Prophylaxis  :   Heparin   Lab Results  Component Value Date   PLT 219 03/14/2021    Diet :  Diet Order            DIET SOFT Room service appropriate? Yes; Fluid consistency: Thin  Diet effective now                  Inpatient Medications  Scheduled Meds: . abacavir-dolutegravir-lamiVUDine  1 tablet Oral Daily  . busPIRone  30 mg Oral BID  . Chlorhexidine Gluconate Cloth  6 each Topical Q0600  . clonazepam  0.25 mg Oral BID  . ezetimibe  10 mg Oral Daily  . insulin aspart  0-15 Units Subcutaneous TID AC & HS  . isosorbide mononitrate  30 mg Oral Daily  . mupirocin ointment  1 application Nasal BID  . pantoprazole  40 mg Oral QAC breakfast  . tamsulosin  0.4 mg Oral QHS  . tiZANidine  2 mg Oral QHS   Continuous Infusions: . sodium chloride Stopped (03/13/21 2106)  . lactated ringers 75 mL/hr at 03/14/21 0902  . meropenem (MERREM) IV 200 mL/hr at 03/14/21 0314  . vancomycin     PRN Meds:.sodium chloride, acetaminophen **OR** [DISCONTINUED] acetaminophen, HYDROmorphone (DILAUDID) injection **OR** [DISCONTINUED]  HYDROmorphone (DILAUDID) injection, naLOXone (NARCAN)  injection, [DISCONTINUED] ondansetron **OR** ondansetron (ZOFRAN) IV, polyethylene glycol, traMADol, valACYclovir  Antibiotics  :    Anti-infectives (From admission, onward)   Start  Dose/Rate Route Frequency Ordered Stop   03/14/21 1200  vancomycin (VANCOREADY) IVPB 1250 mg/250 mL        1,250 mg 166.7 mL/hr over 90 Minutes Intravenous Every 24 hours 03/13/21 1359     03/14/21 1023  valACYclovir (VALTREX) tablet 1,000 mg       Note to Pharmacy: Trig neuralgia flare     1,000 mg Oral 3 times daily PRN 03/14/21 1023     03/14/21 1000  abacavir-dolutegravir-lamiVUDine (TRIUMEQ) 600-50-300 MG per tablet 1  tablet        1 tablet Oral Daily 03/13/21 2320     03/14/21 0200  meropenem (MERREM) 1 g in sodium chloride 0.9 % 100 mL IVPB        1 g 200 mL/hr over 30 Minutes Intravenous Every 8 hours 03/13/21 2337     03/14/21 0100  ceFEPIme (MAXIPIME) 2 g in sodium chloride 0.9 % 100 mL IVPB  Status:  Discontinued       Note to Pharmacy: Cefepime 2 g IV q12h for CrCl < 60 mL/min   2 g 200 mL/hr over 30 Minutes Intravenous Every 12 hours 03/13/21 2326 03/13/21 2331   03/13/21 2000  piperacillin-tazobactam (ZOSYN) IVPB 3.375 g  Status:  Discontinued        3.375 g 12.5 mL/hr over 240 Minutes Intravenous Every 8 hours 03/13/21 1402 03/13/21 2320   03/13/21 1430  clindamycin (CLEOCIN) IVPB 900 mg        900 mg 100 mL/hr over 30 Minutes Intravenous  Once 03/13/21 1418 03/13/21 1548   03/13/21 1230  vancomycin (VANCOCIN) IVPB 1000 mg/200 mL premix        1,000 mg 200 mL/hr over 60 Minutes Intravenous  Once 03/13/21 1229 03/13/21 1506   03/13/21 1230  piperacillin-tazobactam (ZOSYN) IVPB 3.375 g        3.375 g 100 mL/hr over 30 Minutes Intravenous  Once 03/13/21 1229 03/13/21 1400       Time Spent in minutes  30   Lala Lund M.D on 03/14/2021 at 10:31 AM  To page go to [email or web address]   [facility] -  Office  [phone]    See all Orders from today for further details    Objective:   Vitals:   03/13/21 2348 03/14/21 0337 03/14/21 0848 03/14/21 0936  BP: 108/79 (!) 93/58 96/65 90/60   Pulse: 98 100 (!) 107 (!) 101  Resp: 16 16 12 15   Temp: 98 F (36.7 C) 98.1 F (36.7 C) 98.3 F (36.8 C) 97.9 F (36.6 C)  TempSrc: Oral Oral Oral Oral  SpO2: 96% 95% 97% 95%  Weight:      Height:        Wt Readings from Last 3 Encounters:  03/13/21 80.7 kg  03/09/21 77.1 kg  01/22/21 72.6 kg     Intake/Output Summary (Last 24 hours) at 03/14/2021 1031 Last data filed at 03/14/2021 0826 Gross per 24 hour  Intake 4538.96 ml  Output 860 ml  Net 3678.96 ml   Physical Exam  Awake  Alert, No new F.N deficits, Normal affect [place].AT,PERRAL Supple Neck,No JVD, No cervical lymphadenopathy appriciated.  Symmetrical Chest wall movement, Good air movement bilaterally, CTAB RRR,No Gallops,Rubs or new Murmurs, No Parasternal Heave +ve B.Sounds, Abd Soft, No tenderness, No organomegaly appriciated, No rebound - guarding or rigidity. L chest wall below on 03/14/20         Data Review:    CBC Recent Labs  Lab 03/09/21 1110 03/13/21 1320 03/14/21 0038  WBC 13.1* 18.1* 15.9*  HGB 11.7* 10.5* 10.0*  HCT 35.2* 31.8* 29.8*  PLT 231 262 219  MCV 98.3 99.1 98.0  MCH 32.7 32.7 32.9  MCHC 33.2 33.0 33.6  RDW 12.2 12.9 12.8  LYMPHSABS  --  0.5* 0.4*  MONOABS  --  1.0 0.7  EOSABS  --  0.1 0.1  BASOSABS  --  0.0 0.0    Recent Labs  Lab 03/09/21 1110 03/13/21 1320 03/13/21 1615 03/14/21 0038  NA 130* 131*  --  134*  K 3.7 3.5  --  3.5  CL 93* 95*  --  99  CO2 28 25  --  28  GLUCOSE 283* 327*  --  157*  BUN 18 32*  --  24*  CREATININE 1.03 1.56*  --  1.21  CALCIUM 8.4* 7.9*  --  8.1*  AST 11* 10*  --  10*  ALT 8 8  --  8  ALKPHOS 95 91  --  82  BILITOT 0.4 0.7  --  1.1  ALBUMIN 2.9* 2.2*  --  1.9*  MG  --   --   --  1.4*  PROCALCITON  --   --   --  0.56  LATICACIDVEN  --  1.9 2.4* 1.8  INR  --  1.3*  --  1.3*  HGBA1C  --   --   --  8.3*    ------------------------------------------------------------------------------------------------------------------ No results for input(s): CHOL, HDL, LDLCALC, TRIG, CHOLHDL, LDLDIRECT in the last 72 hours.  Lab Results  Component Value Date   HGBA1C 8.3 (H) 03/14/2021   ------------------------------------------------------------------------------------------------------------------ No results for input(s): TSH, T4TOTAL, T3FREE, THYROIDAB in the last 72 hours.  Invalid input(s): FREET3  Cardiac Enzymes No results for input(s): CKMB, TROPONINI, MYOGLOBIN in the last 168 hours.  Invalid input(s):  CK ------------------------------------------------------------------------------------------------------------------ No results found for: BNP  Micro Results Recent Results (from the past 240 hour(s))  Resp Panel by RT-PCR (Flu A&B, Covid) Nasopharyngeal Swab     Status: None   Collection Time: 03/13/21  2:01 PM   Specimen: Nasopharyngeal Swab; Nasopharyngeal(NP) swabs in vial transport medium  Result Value Ref Range Status   SARS Coronavirus 2 by RT PCR NEGATIVE NEGATIVE Final    Comment: (NOTE) SARS-CoV-2 target nucleic acids are NOT DETECTED.  The SARS-CoV-2 RNA is generally detectable in upper respiratory specimens during the acute phase of infection. The lowest concentration of SARS-CoV-2 viral copies this assay can detect is 138 copies/mL. A negative result does not preclude SARS-Cov-2 infection and should not be used as the sole basis for treatment or other patient management decisions. A negative result may occur with  improper specimen collection/handling, submission of specimen other than nasopharyngeal swab, presence of viral mutation(s) within the areas targeted by this assay, and inadequate number of viral copies(<138 copies/mL). A negative result must be combined with clinical observations, patient history, and epidemiological information. The expected result is Negative.  Fact Sheet for Patients:  EntrepreneurPulse.com.au  Fact Sheet for Healthcare Providers:  IncredibleEmployment.be  This test is no t yet approved or cleared by the [place] FDA and  has been authorized for detection and/or diagnosis of SARS-CoV-2 by FDA under an Emergency Use Authorization (EUA). This EUA will remain  in effect (meaning this test can be used) for the duration of the COVID-19 declaration under Section 564(b)(1) of the Act, 21 U.S.C.section 360bbb-3(b)(1), unless the authorization is terminated  or revoked sooner.       Influenza A by  PCR NEGATIVE  NEGATIVE Final   Influenza B by PCR NEGATIVE NEGATIVE Final    Comment: (NOTE) The Xpert Xpress SARS-CoV-2/FLU/RSV plus assay is intended as an aid in the diagnosis of influenza from Nasopharyngeal swab specimens and should not be used as a sole basis for treatment. Nasal washings and aspirates are unacceptable for Xpert Xpress SARS-CoV-2/FLU/RSV testing.  Fact Sheet for Patients: EntrepreneurPulse.com.au  Fact Sheet for Healthcare Providers: IncredibleEmployment.be  This test is not yet approved or cleared by the [place] FDA and has been authorized for detection and/or diagnosis of SARS-CoV-2 by FDA under an Emergency Use Authorization (EUA). This EUA will remain in effect (meaning this test can be used) for the duration of the COVID-19 declaration under Section 564(b)(1) of the Act, 21 U.S.C. section 360bbb-3(b)(1), unless the authorization is terminated or revoked.  Performed at [facility], [address] [place], [place] [address]   MRSA PCR Screening     Status: Abnormal   Collection Time: 03/13/21 11:51 PM   Specimen: Nasal Mucosa; Nasopharyngeal  Result Value Ref Range Status   MRSA by PCR POSITIVE (A) NEGATIVE Final    Comment:        The GeneXpert MRSA Assay (FDA approved for NASAL specimens only), is one component of a comprehensive MRSA colonization surveillance program. It is not intended to diagnose MRSA infection nor to guide or monitor treatment for MRSA infections. RESULT CALLED TO, READ BACK BY AND VERIFIED WITHDorna Bloom RN 03/14/21 0432 JDW Performed at [place] [address] [place],  [address]     Radiology Reports CT Chest W Contrast  Result Date: 03/13/2021 CLINICAL DATA:  Follow-up right chest wall hematoma and fractures of the right sixth through eighth ribs. Diabetes. HIV. EXAM: CT CHEST WITH CONTRAST TECHNIQUE: Multidetector CT imaging of the chest was  performed during intravenous contrast administration. CONTRAST:  148mL OMNIPAQUE IOHEXOL 300 MG/ML  SOLN COMPARISON:  03/09/2021 FINDINGS: Cardiovascular: Heart size remains normal. No pericardial fluid. Coronary artery calcification as seen previously. Aortic atherosclerotic calcification as seen previously. Mediastinum/Nodes: Normal Lungs/Pleura: Scarring/atelectasis at the lung bases left more than right unchanged since the previous study. Small areas of patchy density in the superior segment of the right lower lobe unchanged from the study of 4 days ago. No new or progressive pulmonary finding. No pneumothorax. Upper Abdomen: No upper abdominal injury or acute finding. Musculoskeletal: There is increasing size of the right chest wall collections, primarily centered around the right sixth and eighth anterolateral ribs. Those ribs show a pattern of irregular destruction that look more like pathologic fractures due to infection rather than traumatic fractures. Air bubbles are now present within the collections. There is mild extrapleural involvement in side of the ribcage, without frank breakthrough into the pleural space. Most of the collections manifest external to the ribcage. No new areas of involvement are seen. IMPRESSION: Increasing size of the right chest wall collections, primarily centered around the right sixth and eighth anterolateral ribs, with development of air bubbles. Those ribs show a pattern of irregular destruction that have progressed and look more like pathologic fractures due to infection rather than traumatic fractures. Air bubbles are now present within the collections and they probably represent abscesses. There is mild extrapleural involvement on the inner side of the ribcage, without frank breakthrough into the pleural space. The majority of the collections are on the outer side of the ribcage. No new areas of involvement are seen. Case discussed with Dr. Regenia Skeeter at [phone]  hours. Aortic  Atherosclerosis (ICD10-I70.0). Electronically Signed   By: Nelson Chimes M.D.   On: 03/13/2021 14:19   CT Chest W Contrast  Result Date: 03/09/2021 CLINICAL DATA:  Fall 3 weeks prior with right rib fractures and persistent chest pain. EXAM: CT CHEST WITH CONTRAST TECHNIQUE: Multidetector CT imaging of the chest was performed during intravenous contrast administration. CONTRAST:  46mL OMNIPAQUE IOHEXOL 300 MG/ML  SOLN COMPARISON:  Chest radiograph from earlier today. 11/22/2019 chest CT angiogram. FINDINGS: Cardiovascular: Normal heart size. No significant pericardial effusion/thickening. Three-vessel coronary atherosclerosis. Atherosclerotic nonaneurysmal thoracic aorta. Top-normal caliber main pulmonary artery (3.0 cm diameter). No central pulmonary emboli. Mediastinum/Nodes: No discrete thyroid nodules. Unremarkable esophagus. No pathologically enlarged axillary, mediastinal or hilar lymph nodes. Lungs/Pleura: No pneumothorax. No pleural effusion. No acute consolidative airspace disease, lung masses or significant pulmonary nodules. Mild platelike atelectasis in the anterior basilar left lower lobe. Mild patchy ground-glass opacity in peripheral posterior right mid lung. Upper abdomen: No acute abnormality. Musculoskeletal: No aggressive appearing focal osseous lesions. Nondisplaced acute anterior right sixth, seventh and eighth rib fractures with surrounding chest wall hematoma measuring up to 8.1 x 4.9 cm in maximum axial dimensions at the level of the anterior right sixth rib fracture (series 2/image 114). Moderate thoracic spondylosis. IMPRESSION: 1. Nondisplaced acute anterior right sixth, seventh and eighth rib fractures with surrounding chest wall hematoma. No pneumothorax or hemothorax. No discrete bone lesions. Clinical follow-up advised to ensure resolution of the right chest wall hematoma. Any need for follow-up imaging should be based on clinical assessment. 2. Mild patchy ground-glass opacity in  the peripheral posterior right mid lung, nonspecific, favor mild pulmonary contusion. 3. Mild platelike atelectasis at the left lung base. 4. Three-vessel coronary atherosclerosis. 5. Aortic Atherosclerosis (ICD10-I70.0). Electronically Signed   By: Ilona Sorrel M.D.   On: 03/09/2021 13:03   DG Chest Port 1 View  Result Date: 03/13/2021 CLINICAL DATA:  Sepsis, right rib pain. EXAM: PORTABLE CHEST 1 VIEW COMPARISON:  March 09, 2021. FINDINGS: The heart size and mediastinal contours are within normal limits. Both lungs are clear. No pneumothorax or pleural effusion is noted. The visualized skeletal structures are unremarkable. IMPRESSION: No active disease. Electronically Signed   By: Marijo Conception M.D.   On: 03/13/2021 14:11   DG Chest Portable 1 View  Result Date: 03/09/2021 CLINICAL DATA:  Chest pain. Chest [place] hurting late yesterday. Complains of right-sided sternal pain and chest pain. EXAM: PORTABLE CHEST 1 VIEW COMPARISON:  None. FINDINGS: The heart size and mediastinal contours are within normal limits. Both lungs are clear. Blunting of the left costophrenic angle is noted, new from prior studies. The visualized skeletal structures are unremarkable. IMPRESSION: New blunting of left costophrenic angle may reflect small effusion. The lungs are otherwise clear. No signs of interstitial edema. Electronically Signed   By: Kerby Moors M.D.   On: 03/09/2021 11:26

## 2021-03-14 NOTE — Progress Notes (Signed)
Initial Nutrition Assessment  DOCUMENTATION CODES:   Not applicable  INTERVENTION:    Glucerna Shake po TID, each supplement provides 220 kcal and 10 grams of protein  MVI with minerals daily  NUTRITION DIAGNOSIS:   Inadequate oral intake related to acute illness (pain from rib fracture) as evidenced by per patient/family report.  GOAL:   Patient will meet greater than or equal to 90% of their needs  MONITOR:   PO intake,Supplement acceptance,Labs  REASON FOR ASSESSMENT:   Malnutrition Screening Tool    ASSESSMENT:   70 yo male admitted with chest wall abscess formation and cellulitis. Recent R side rib cage fracture S/P fall 02/22/21. PMH includes hepatitis C, HTN, HLD, DM-2, chronic pain syndrome, IVDU (abstinent since 2019), MDD, HIV.   Nasal swab for MRSA was positive.   Rib fracture has been evaluated in the ED a few times over the past few weeks and treatment has been conservative. Pain has progressively worsened.  Patient reports that he has been eating poorly for the past 2 weeks due to pain. He thinks he has lost a few pounds, but unsure exact amount. Prior to his fall, he was eating well. His appetite is currently very poor. He drinks Glucerna shakes at home. He has a case that was just delivered to his house.   Usual weights reviewed. Patient weighed 72.6 kg on 12/10/20 (~3 months ago). He is currently 80.7 kg. No significant weight loss noted.  Labs reviewed. Na 134, Mag 1.4, A1C 8.3 CBG: 108-107  Medications reviewed and include Novolog SSI, Flomax.  NUTRITION - FOCUSED PHYSICAL EXAM:  unable to complete  Diet Order:   Diet Order            DIET SOFT Room service appropriate? Yes; Fluid consistency: Thin  Diet effective now                 EDUCATION NEEDS:   Not appropriate for education at this time  Skin:  Skin Assessment: Reviewed RN Assessment  Last BM:  no BM documented  Height:   Ht Readings from Last 1 Encounters:  03/13/21 6'  (1.829 m)    Weight:   Wt Readings from Last 1 Encounters:  03/13/21 80.7 kg    Ideal Body Weight:  80.9 kg  BMI:  Body mass index is 24.13 kg/m.  Estimated Nutritional Needs:   Kcal:  2050-2250  Protein:  110-125 gm  Fluid:  >/= 2 L    Lucas Mallow, RD, LDN, CNSC Please refer to Amion for contact information.

## 2021-03-15 DIAGNOSIS — N179 Acute kidney failure, unspecified: Secondary | ICD-10-CM | POA: Diagnosis not present

## 2021-03-15 DIAGNOSIS — R6520 Severe sepsis without septic shock: Secondary | ICD-10-CM | POA: Diagnosis not present

## 2021-03-15 DIAGNOSIS — A419 Sepsis, unspecified organism: Secondary | ICD-10-CM | POA: Diagnosis not present

## 2021-03-15 LAB — CBC WITH DIFFERENTIAL/PLATELET
Abs Immature Granulocytes: 0.11 10*3/uL — ABNORMAL HIGH (ref 0.00–0.07)
Basophils Absolute: 0 10*3/uL (ref 0.0–0.1)
Basophils Relative: 0 %
Eosinophils Absolute: 0.1 10*3/uL (ref 0.0–0.5)
Eosinophils Relative: 1 %
HCT: 29.8 % — ABNORMAL LOW (ref 39.0–52.0)
Hemoglobin: 9.7 g/dL — ABNORMAL LOW (ref 13.0–17.0)
Immature Granulocytes: 1 %
Lymphocytes Relative: 4 %
Lymphs Abs: 0.5 10*3/uL — ABNORMAL LOW (ref 0.7–4.0)
MCH: 32 pg (ref 26.0–34.0)
MCHC: 32.6 g/dL (ref 30.0–36.0)
MCV: 98.3 fL (ref 80.0–100.0)
Monocytes Absolute: 0.9 10*3/uL (ref 0.1–1.0)
Monocytes Relative: 7 %
Neutro Abs: 10.7 10*3/uL — ABNORMAL HIGH (ref 1.7–7.7)
Neutrophils Relative %: 87 %
Platelets: 209 10*3/uL (ref 150–400)
RBC: 3.03 MIL/uL — ABNORMAL LOW (ref 4.22–5.81)
RDW: 13.2 % (ref 11.5–15.5)
WBC: 12.2 10*3/uL — ABNORMAL HIGH (ref 4.0–10.5)
nRBC: 0 % (ref 0.0–0.2)

## 2021-03-15 LAB — URINE CULTURE: Culture: NO GROWTH

## 2021-03-15 LAB — COMPREHENSIVE METABOLIC PANEL
ALT: 7 U/L (ref 0–44)
AST: 9 U/L — ABNORMAL LOW (ref 15–41)
Albumin: 1.7 g/dL — ABNORMAL LOW (ref 3.5–5.0)
Alkaline Phosphatase: 98 U/L (ref 38–126)
Anion gap: 8 (ref 5–15)
BUN: 17 mg/dL (ref 8–23)
CO2: 26 mmol/L (ref 22–32)
Calcium: 8 mg/dL — ABNORMAL LOW (ref 8.9–10.3)
Chloride: 100 mmol/L (ref 98–111)
Creatinine, Ser: 1.24 mg/dL (ref 0.61–1.24)
GFR, Estimated: >60 mL/min (ref >60)
Glucose, Bld: 101 mg/dL — ABNORMAL HIGH (ref 70–99)
Potassium: 3.7 mmol/L (ref 3.5–5.1)
Sodium: 134 mmol/L — ABNORMAL LOW (ref 135–145)
Total Bilirubin: 1 mg/dL (ref 0.3–1.2)
Total Protein: 5.3 g/dL — ABNORMAL LOW (ref 6.5–8.1)

## 2021-03-15 LAB — GLUCOSE, CAPILLARY
Glucose-Capillary: 87 mg/dL (ref 70–99)
Glucose-Capillary: 124 mg/dL — ABNORMAL HIGH (ref 70–99)
Glucose-Capillary: 144 mg/dL — ABNORMAL HIGH (ref 70–99)
Glucose-Capillary: 167 mg/dL — ABNORMAL HIGH (ref 70–99)

## 2021-03-15 LAB — MAGNESIUM: Magnesium: 1.8 mg/dL (ref 1.7–2.4)

## 2021-03-15 LAB — PROCALCITONIN: Procalcitonin: 1 ng/mL

## 2021-03-15 MED ORDER — VANCOMYCIN HCL 1500 MG/300ML IV SOLN
1500.0000 mg | INTRAVENOUS | Status: DC
  Administered 2021-03-15 – 2021-03-21 (×7): 1500 mg via INTRAVENOUS
  Filled 2021-03-15 (×7): qty 300

## 2021-03-15 MED ORDER — LACTATED RINGERS IV SOLN: INTRAVENOUS | Status: AC

## 2021-03-15 NOTE — Progress Notes (Signed)
PROGRESS NOTE                                                                                                                                                                                                             Patient Demographics:    Brian Malone, is a 70 y.o. male, DOB - Mar 17, 1951, YOV:[id number]  Outpatient Primary MD for the patient is Brian Malone, [name], MD    LOS - 2  Admit date - 03/13/2021    Chief Complaint  Patient presents with  . Rib Injury       Brief Narrative (HPI from H&P)  70 year old male with past medical history of hepatitis C, hypertension, hyperlipidemia, diabetes mellitus type 2, chronic pain syndrome, intravenous drug use (abstinent since 2019), major depressive disorder and HIV who had a trip and fall in his yard on 02/22/2021 after which he hurt his right-sided chest wall, at that time he was diagnosed with right-sided rib cage fracture and injury, he was seen in the ER a few times since then and was treated conservatively, his pain and discomfort continued to get worse and he presented again to [place] on  03/13/2021 CT scan suggested that he had right chest wall abscess formation and cellulitis with concerns for sepsis.   Subjective:    Brian Malone today has, No headache, No shortness of breath, continues to have right-sided rib cage pain and discomfort, no abdominal pain no nausea   Assessment  & Plan :     1. Sepsis due to right-sided chest wall abscess formation after mechanical fall and multiple right rib fractures sustained on 02/22/2021 - he being treated with empiric IV antibiotics along with IV fluids, currently sepsis pathophysiology has improved, Dr. Kipp Brood cardiothoracic surgery was consulted in the ER who suggested ultrasound-guided drainage of the abscess by IR, this will be done on 03/14/2021, continue to monitor closely.  Avoid overuse of  narcotics  Encouraged the patient to sit up in chair in the daytime use I-S and flutter valve for pulmonary toiletry.  Will advance activity and titrate down oxygen as possible.  SpO2: 100 % O2 Flow Rate (L/min): 2 L/min   2.  Chronic pain and narcotic use.  Supportive care as needed for acute discomfort, avoid overuse, as needed Narcan added.  Patient counseled.  3.  Continue home  regimen follows with [facility] ID department.  4.  Anxiety and depression.  Home medications continued.  Currently not homicidal or suicidal.  5.  GERD.  On PPI.  6.  BPH.  On Flomax.  7.  AKI.  Improved after IV fluids likely due to ATN from sepsis.  8.  Hypertension.  Blood pressure soft, skipped today's blood pressure medications will monitor closely.   9.  DM type II.  On sliding scale monitor and adjust  Lab Results  Component Value Date   HGBA1C 8.3 (H) 03/14/2021   CBG (last 3)  Recent Labs    03/14/21 1737 03/14/21 2136 03/15/21 0747  GLUCAP 130* 94 87          Condition - Extremely Guarded  Family Communication  : None present bedside  Code Status :  Full  Consults  :  IR  PUD Prophylaxis : PPI   Procedures  :     CT -  Increasing size of the right chest wall collections, primarily centered around the right sixth and eighth anterolateral ribs, with development of air bubbles. Those ribs show a pattern of irregular destruction that have progressed and look more like pathologic fractures due to infection rather than traumatic fractures. Air bubbles are now present within the collections and they probably represent abscesses. There is mild extrapleural involvement on the inner side of the ribcage, without frank breakthrough into the pleural space. The majority of the collections are on the outer side of the ribcage. No new areas of involvement are seen.      Disposition Plan  :    Status is: Inpatient  Remains inpatient appropriate because:IV treatments appropriate due to  intensity of illness or inability to take PO   Dispo: The patient is from: Home              Anticipated d/c is to: Home              Patient currently is not medically stable to d/c.   Difficult to place patient No  DVT Prophylaxis  :   Heparin   Lab Results  Component Value Date   PLT 209 03/15/2021    Diet :  Diet Order            DIET SOFT Room service appropriate? Yes; Fluid consistency: Thin  Diet effective now                  Inpatient Medications  Scheduled Meds: . abacavir-dolutegravir-lamiVUDine  1 tablet Oral Daily  . busPIRone  30 mg Oral BID  . Chlorhexidine Gluconate Cloth  6 each Topical Q0600  . clonazepam  0.25 mg Oral BID  . ezetimibe  10 mg Oral Daily  . feeding supplement (GLUCERNA SHAKE)  237 mL Oral TID BM  . heparin injection (subcutaneous)  5,000 Units Subcutaneous Q8H  . insulin aspart  0-15 Units Subcutaneous TID AC & HS  . isosorbide mononitrate  30 mg Oral Daily  . multivitamin with minerals  1 tablet Oral Daily  . mupirocin ointment  1 application Nasal BID  . pantoprazole  40 mg Oral QAC breakfast  . tamsulosin  0.4 mg Oral QHS  . tiZANidine  2 mg Oral QHS   Continuous Infusions: . sodium chloride Stopped (03/13/21 2106)  . lactated ringers    . meropenem (MERREM) IV 1 g (03/15/21 0844)  . vancomycin     PRN Meds:.sodium chloride, acetaminophen **OR** [DISCONTINUED] acetaminophen, HYDROmorphone (DILAUDID) injection **OR** [DISCONTINUED]  HYDROmorphone (DILAUDID) injection, naLOXone (NARCAN)  injection, [DISCONTINUED] ondansetron **OR** ondansetron (ZOFRAN) IV, polyethylene glycol, traMADol, valACYclovir  Antibiotics  :    Anti-infectives (From admission, onward)   Start     Dose/Rate Route Frequency Ordered Stop   03/15/21 1200  vancomycin (VANCOREADY) IVPB 1500 mg/300 mL        1,500 mg 150 mL/hr over 120 Minutes Intravenous Every 24 hours 03/15/21 0732     03/14/21 1200  vancomycin (VANCOREADY) IVPB 1250 mg/250 mL  Status:   Discontinued        1,250 mg 166.7 mL/hr over 90 Minutes Intravenous Every 24 hours 03/13/21 1359 03/15/21 0732   03/14/21 1023  valACYclovir (VALTREX) tablet 1,000 mg       Note to Pharmacy: Trig neuralgia flare     1,000 mg Oral 3 times daily PRN 03/14/21 1023     03/14/21 1000  abacavir-dolutegravir-lamiVUDine (TRIUMEQ) 600-50-300 MG per tablet 1 tablet        1 tablet Oral Daily 03/13/21 2320     03/14/21 0200  meropenem (MERREM) 1 g in sodium chloride 0.9 % 100 mL IVPB        1 g 200 mL/hr over 30 Minutes Intravenous Every 8 hours 03/13/21 2337     03/14/21 0100  ceFEPIme (MAXIPIME) 2 g in sodium chloride 0.9 % 100 mL IVPB  Status:  Discontinued       Note to Pharmacy: Cefepime 2 g IV q12h for CrCl < 60 mL/min   2 g 200 mL/hr over 30 Minutes Intravenous Every 12 hours 03/13/21 2326 03/13/21 2331   03/13/21 2000  piperacillin-tazobactam (ZOSYN) IVPB 3.375 g  Status:  Discontinued        3.375 g 12.5 mL/hr over 240 Minutes Intravenous Every 8 hours 03/13/21 1402 03/13/21 2320   03/13/21 1430  clindamycin (CLEOCIN) IVPB 900 mg        900 mg 100 mL/hr over 30 Minutes Intravenous  Once 03/13/21 1418 03/13/21 1548   03/13/21 1230  vancomycin (VANCOCIN) IVPB 1000 mg/200 mL premix        1,000 mg 200 mL/hr over 60 Minutes Intravenous  Once 03/13/21 1229 03/13/21 1506   03/13/21 1230  piperacillin-tazobactam (ZOSYN) IVPB 3.375 g        3.375 g 100 mL/hr over 30 Minutes Intravenous  Once 03/13/21 1229 03/13/21 1400       Time Spent in minutes  30   Lala Lund M.D on 03/15/2021 at 10:26 AM  To page go to [email or web address]   [facility] -  Office  [phone]    See all Orders from today for further details    Objective:   Vitals:   03/14/21 1320 03/14/21 1417 03/14/21 1956 03/15/21 0508  BP: 94/62 105/63 135/78 125/74  Pulse: 93 96 94 88  Resp: 14 12 14 20   Temp: 97.9 F (36.6 C) 98.2 F (36.8 C) 98.4 F (36.9 C) 98.3 F (36.8 C)  TempSrc: Oral Oral Oral Oral   SpO2: 100% 97% 97% 100%  Weight:      Height:        Wt Readings from Last 3 Encounters:  03/13/21 80.7 kg  03/09/21 77.1 kg  01/22/21 72.6 kg     Intake/Output Summary (Last 24 hours) at 03/15/2021 1026 Last data filed at 03/15/2021 0515 Gross per 24 hour  Intake 1892.65 ml  Output 400 ml  Net 1492.65 ml   Physical Exam  Awake Alert, No new F.N deficits, Normal affect [place].AT,PERRAL Supple Neck,No  JVD, No cervical lymphadenopathy appriciated.  Symmetrical Chest wall movement, Good air movement bilaterally, CTAB RRR,No Gallops,Rubs or new Murmurs, No Parasternal Heave +ve B.Sounds, Abd Soft, No tenderness, No organomegaly appriciated, No rebound - guarding or rigidity. L chest wall below on 03/14/20         Data Review:    CBC Recent Labs  Lab 03/09/21 1110 03/13/21 1320 03/14/21 0038 03/15/21 0220  WBC 13.1* 18.1* 15.9* 12.2*  HGB 11.7* 10.5* 10.0* 9.7*  HCT 35.2* 31.8* 29.8* 29.8*  PLT 231 262 219 209  MCV 98.3 99.1 98.0 98.3  MCH 32.7 32.7 32.9 32.0  MCHC 33.2 33.0 33.6 32.6  RDW 12.2 12.9 12.8 13.2  LYMPHSABS  --  0.5* 0.4* 0.5*  MONOABS  --  1.0 0.7 0.9  EOSABS  --  0.1 0.1 0.1  BASOSABS  --  0.0 0.0 0.0    Recent Labs  Lab 03/09/21 1110 03/13/21 1320 03/13/21 1615 03/14/21 0038 03/15/21 0220  NA 130* 131*  --  134* 134*  K 3.7 3.5  --  3.5 3.7  CL 93* 95*  --  99 100  CO2 28 25  --  28 26  GLUCOSE 283* 327*  --  157* 101*  BUN 18 32*  --  24* 17  CREATININE 1.03 1.56*  --  1.21 1.24  CALCIUM 8.4* 7.9*  --  8.1* 8.0*  AST 11* 10*  --  10* 9*  ALT 8 8  --  8 7  ALKPHOS 95 91  --  82 98  BILITOT 0.4 0.7  --  1.1 1.0  ALBUMIN 2.9* 2.2*  --  1.9* 1.7*  MG  --   --   --  1.4* 1.8  PROCALCITON  --   --   --  0.56 1.00  LATICACIDVEN  --  1.9 2.4* 1.8  --   INR  --  1.3*  --  1.3*  --   HGBA1C  --   --   --  8.3*  --     ------------------------------------------------------------------------------------------------------------------ No  results for input(s): CHOL, HDL, LDLCALC, TRIG, CHOLHDL, LDLDIRECT in the last 72 hours.  Lab Results  Component Value Date   HGBA1C 8.3 (H) 03/14/2021   ------------------------------------------------------------------------------------------------------------------ No results for input(s): TSH, T4TOTAL, T3FREE, THYROIDAB in the last 72 hours.  Invalid input(s): FREET3  Cardiac Enzymes No results for input(s): CKMB, TROPONINI, MYOGLOBIN in the last 168 hours.  Invalid input(s): CK ------------------------------------------------------------------------------------------------------------------ No results found for: BNP  Micro Results Recent Results (from the past 240 hour(s))  Urine culture     Status: None   Collection Time: 03/13/21 12:25 AM   Specimen: In/Out Cath Urine  Result Value Ref Range Status   Specimen Description IN/OUT CATH URINE  Final   Special Requests NONE  Final   Culture   Final    NO GROWTH Performed at [facility], [address], [place], [place] [address]    Report Status 03/15/2021 FINAL  Final  Blood Culture (routine x 2)     Status: None (Preliminary result)   Collection Time: 03/13/21 12:25 PM   Specimen: Right Antecubital; Blood  Result Value Ref Range Status   Specimen Description   Final    RIGHT ANTECUBITAL BLOOD Performed at [facility], [address] [place], [place] [address]    Special Requests   Final    BOTTLES DRAWN AEROBIC AND ANAEROBIC Blood Culture results may not be optimal due to an  inadequate volume of blood received in culture bottles Performed at [facility], [address] [place], [place] [address]    Culture   Final    NO GROWTH < 24 HOURS Performed at [facility], [address] [place], [place] [address]    Report Status PENDING  Incomplete  Blood Culture (routine x 2)     Status: None (Preliminary result)   Collection Time: 03/13/21  1:15 PM   Specimen: BLOOD RIGHT HAND   Result Value Ref Range Status   Specimen Description   Final    BLOOD RIGHT HAND BLOOD Performed at [facility], [address] [place], [place] [address]    Special Requests   Final    BOTTLES DRAWN AEROBIC AND ANAEROBIC Blood Culture results may not be optimal due to an inadequate volume of blood received in culture bottles Performed at [facility], [address] [place], [place] [address]    Culture   Final    NO GROWTH < 24 HOURS Performed at [facility], [address], [place], [place] [address]    Report Status PENDING  Incomplete  Resp Panel by RT-PCR (Flu A&B, Covid) Nasopharyngeal Swab     Status: None   Collection Time: 03/13/21  2:01 PM   Specimen: Nasopharyngeal Swab; Nasopharyngeal(NP) swabs in vial transport medium  Result Value Ref Range Status   SARS Coronavirus 2 by RT PCR NEGATIVE NEGATIVE Final    Comment: (NOTE) SARS-CoV-2 target nucleic acids are NOT DETECTED.  The SARS-CoV-2 RNA is generally detectable in upper respiratory specimens during the acute phase of infection. The lowest concentration of SARS-CoV-2 viral copies this assay can detect is 138 copies/mL. A negative result does not preclude SARS-Cov-2 infection and should not be used as the sole basis for treatment or other patient management decisions. A negative result may occur with  improper specimen collection/handling, submission of specimen other than nasopharyngeal swab, presence of viral mutation(s) within the areas targeted by this assay, and inadequate number of viral copies(<138 copies/mL). A negative result must be combined with clinical observations, patient history, and epidemiological information. The expected result is Negative.  Fact Sheet for Patients:  EntrepreneurPulse.com.au  Fact Sheet for Healthcare Providers:  IncredibleEmployment.be  This test is no t yet approved or cleared by the [place] FDA and   has been authorized for detection and/or diagnosis of SARS-CoV-2 by FDA under an Emergency Use Authorization (EUA). This EUA will remain  in effect (meaning this test can be used) for the duration of the COVID-19 declaration under Section 564(b)(1) of the Act, 21 U.S.C.section 360bbb-3(b)(1), unless the authorization is terminated  or revoked sooner.       Influenza A by PCR NEGATIVE NEGATIVE Final   Influenza B by PCR NEGATIVE NEGATIVE Final    Comment: (NOTE) The Xpert Xpress SARS-CoV-2/FLU/RSV plus assay is intended as an aid in the diagnosis of influenza from Nasopharyngeal swab specimens and should not be used as a sole basis for treatment. Nasal washings and aspirates are unacceptable for Xpert Xpress SARS-CoV-2/FLU/RSV testing.  Fact Sheet for Patients: EntrepreneurPulse.com.au  Fact Sheet for Healthcare Providers: IncredibleEmployment.be  This test is not yet approved or cleared by the [place] FDA and has been authorized for detection and/or diagnosis of SARS-CoV-2 by FDA under an Emergency Use Authorization (EUA). This EUA will remain in effect (meaning this test can be used) for the duration of the COVID-19 declaration under  Section 564(b)(1) of the Act, 21 U.S.C. section 360bbb-3(b)(1), unless the authorization is terminated or revoked.  Performed at [facility], [address] [place], [place] [address]   MRSA PCR Screening     Status: Abnormal   Collection Time: 03/13/21 11:51 PM   Specimen: Nasal Mucosa; Nasopharyngeal  Result Value Ref Range Status   MRSA by PCR POSITIVE (A) NEGATIVE Final    Comment:        The GeneXpert MRSA Assay (FDA approved for NASAL specimens only), is one component of a comprehensive MRSA colonization surveillance program. It is not intended to diagnose MRSA infection nor to guide or monitor treatment for MRSA infections. RESULT CALLED TO, READ BACK BY AND VERIFIED  WITHDorna Bloom RN 03/14/21 0432 JDW Performed at [facility] [address], [place], [place] [address]   Aerobic/Anaerobic Culture (surgical/deep wound)     Status: None (Preliminary result)   Collection Time: 03/14/21  1:23 PM   Specimen: Abscess  Result Value Ref Range Status   Specimen Description ABSCESS  Final   Special Requests NONE  Final   Gram Stain   Final    ABUNDANT WBC PRESENT, PREDOMINANTLY PMN ABUNDANT GRAM POSITIVE COCCI IN CLUSTERS    Culture   Final    ABUNDANT STAPHYLOCOCCUS AUREUS SUSCEPTIBILITIES TO FOLLOW Performed at [facility], [address], [place], [place] [address]    Report Status PENDING  Incomplete    Radiology Reports CT Chest W Contrast  Result Date: 03/13/2021 CLINICAL DATA:  Follow-up right chest wall hematoma and fractures of the right sixth through eighth ribs. Diabetes. HIV. EXAM: CT CHEST WITH CONTRAST TECHNIQUE: Multidetector CT imaging of the chest was performed during intravenous contrast administration. CONTRAST:  129mL OMNIPAQUE IOHEXOL 300 MG/ML  SOLN COMPARISON:  03/09/2021 FINDINGS: Cardiovascular: Heart size remains normal. No pericardial fluid. Coronary artery calcification as seen previously. Aortic atherosclerotic calcification as seen previously. Mediastinum/Nodes: Normal Lungs/Pleura: Scarring/atelectasis at the lung bases left more than right unchanged since the previous study. Small areas of patchy density in the superior segment of the right lower lobe unchanged from the study of 4 days ago. No new or progressive pulmonary finding. No pneumothorax. Upper Abdomen: No upper abdominal injury or acute finding. Musculoskeletal: There is increasing size of the right chest wall collections, primarily centered around the right sixth and eighth anterolateral ribs. Those ribs show a pattern of irregular destruction that look more like pathologic fractures due to infection rather than traumatic fractures. Air bubbles are now  present within the collections. There is mild extrapleural involvement in side of the ribcage, without frank breakthrough into the pleural space. Most of the collections manifest external to the ribcage. No new areas of involvement are seen. IMPRESSION: Increasing size of the right chest wall collections, primarily centered around the right sixth and eighth anterolateral ribs, with development of air bubbles. Those ribs show a pattern of irregular destruction that have progressed and look more like pathologic fractures due to infection rather than traumatic fractures. Air bubbles are now present within the collections and they probably represent abscesses. There is mild extrapleural involvement on the inner side of the ribcage, without frank breakthrough into the pleural space. The majority of the collections are on the outer side of the ribcage. No new areas of involvement are seen. Case discussed with Dr. Regenia Skeeter at 1419 hours. Aortic Atherosclerosis (ICD10-I70.0). Electronically Signed   By: Nelson Chimes M.D.   On: 03/13/2021 14:19  CT Chest W Contrast  Result Date: 03/09/2021 CLINICAL DATA:  Fall 3 weeks prior with right rib fractures and persistent chest pain. EXAM: CT CHEST WITH CONTRAST TECHNIQUE: Multidetector CT imaging of the chest was performed during intravenous contrast administration. CONTRAST:  37mL OMNIPAQUE IOHEXOL 300 MG/ML  SOLN COMPARISON:  Chest radiograph from earlier today. 11/22/2019 chest CT angiogram. FINDINGS: Cardiovascular: Normal heart size. No significant pericardial effusion/thickening. Three-vessel coronary atherosclerosis. Atherosclerotic nonaneurysmal thoracic aorta. Top-normal caliber main pulmonary artery (3.0 cm diameter). No central pulmonary emboli. Mediastinum/Nodes: No discrete thyroid nodules. Unremarkable esophagus. No pathologically enlarged axillary, mediastinal or hilar lymph nodes. Lungs/Pleura: No pneumothorax. No pleural effusion. No acute consolidative  airspace disease, lung masses or significant pulmonary nodules. Mild platelike atelectasis in the anterior basilar left lower lobe. Mild patchy ground-glass opacity in peripheral posterior right mid lung. Upper abdomen: No acute abnormality. Musculoskeletal: No aggressive appearing focal osseous lesions. Nondisplaced acute anterior right sixth, seventh and eighth rib fractures with surrounding chest wall hematoma measuring up to 8.1 x 4.9 cm in maximum axial dimensions at the level of the anterior right sixth rib fracture (series 2/image 114). Moderate thoracic spondylosis. IMPRESSION: 1. Nondisplaced acute anterior right sixth, seventh and eighth rib fractures with surrounding chest wall hematoma. No pneumothorax or hemothorax. No discrete bone lesions. Clinical follow-up advised to ensure resolution of the right chest wall hematoma. Any need for follow-up imaging should be based on clinical assessment. 2. Mild patchy ground-glass opacity in the peripheral posterior right mid lung, nonspecific, favor mild pulmonary contusion. 3. Mild platelike atelectasis at the left lung base. 4. Three-vessel coronary atherosclerosis. 5. Aortic Atherosclerosis (ICD10-I70.0). Electronically Signed   By: Ilona Sorrel M.D.   On: 03/09/2021 13:03   IR US Guide Bx Asp/Drain  Result Date: 03/14/2021 INDICATION: 70 year old with history of fall and right rib fractures. Patient developed right chest hematomas. Patient is complaining of pain and recent CT demonstrates gas within the hematomas. Findings are concerning for infected hematomas. EXAM: PLACEMENT OF SUPERFICIAL RIGHT ANTERIOR CHEST WALL DRAIN USING ULTRASOUND GUIDANCE x 2 MEDICATIONS: Moderate sedation ANESTHESIA/SEDATION: Fentanyl 100 mcg IV; Versed 2.0 mg IV Moderate Sedation Time:  29 minutes The patient was continuously monitored during the procedure by the interventional radiology nurse under my direct supervision. COMPLICATIONS: None immediate. PROCEDURE: Informed  written consent was obtained from the patient after a thorough discussion of the procedural risks, benefits and alternatives. All questions were addressed. Maximal Sterile Barrier Technique was utilized including caps, mask, sterile gowns, sterile gloves, sterile drape, hand hygiene and skin antiseptic. A timeout was performed prior to the initiation of the procedure. The right anterior chest wall hematomas were evaluated with ultrasound. Complex collections containing a small amount of fluid were obtained. The right anterior chest was prepped with chlorhexidine and sterile field was created. Attention was initially directed to the more inferior or caudal anterior chest wall collection. Skin was anesthetized with 1% lidocaine. Using ultrasound guidance, an 18 gauge trocar needle was directed into the collection and pink purulent fluid was aspirated. Superstiff Amplatz wire was advanced into the complex collection and the tract was dilated to accommodate a 10 [place] drain. Catheter was sutured to the skin and attached to a suction bulb. A sample of fluid was sent for culture. A second area more cephalad was targeted with ultrasound guidance. Skin was anesthetized with 1% lidocaine. Using ultrasound guidance, an 18 gauge trocar needle was directed into the complex collection. Again, purulent fluid was aspirated. Superstiff Amplatz wire was advanced into this  collection and a 10 [place] drain was placed. Additional purulent fluid was aspirated and the catheter was sutured to skin and attached to a suction bulb. Dressings were placed over both drains. FINDINGS: Patient has palpable hematomas in the right anterior chest and upper abdomen region. Ultrasound demonstrates heterogeneous collections in both areas containing a small amount of compressible fluid. There are echogenic areas within the collections compatible with known gas. 10 French drains were placed in both areas and purulent fluid was draining from both  collections. Both collections are very complex and compatible with infected hematomas. IMPRESSION: Ultrasound-guided placement of 2 percutaneous drains within the superficial right anterior chest wall collections. Findings are compatible with infected hematomas. Fluid sample was sent for culture. Electronically Signed   By: Markus Daft M.D.   On: 03/14/2021 18:37   IR US Guide Bx Asp/Drain  Result Date: 03/14/2021 INDICATION: 70 year old with history of fall and right rib fractures. Patient developed right chest hematomas. Patient is complaining of pain and recent CT demonstrates gas within the hematomas. Findings are concerning for infected hematomas. EXAM: PLACEMENT OF SUPERFICIAL RIGHT ANTERIOR CHEST WALL DRAIN USING ULTRASOUND GUIDANCE x 2 MEDICATIONS: Moderate sedation ANESTHESIA/SEDATION: Fentanyl 100 mcg IV; Versed 2.0 mg IV Moderate Sedation Time:  29 minutes The patient was continuously monitored during the procedure by the interventional radiology nurse under my direct supervision. COMPLICATIONS: None immediate. PROCEDURE: Informed written consent was obtained from the patient after a thorough discussion of the procedural risks, benefits and alternatives. All questions were addressed. Maximal Sterile Barrier Technique was utilized including caps, mask, sterile gowns, sterile gloves, sterile drape, hand hygiene and skin antiseptic. A timeout was performed prior to the initiation of the procedure. The right anterior chest wall hematomas were evaluated with ultrasound. Complex collections containing a small amount of fluid were obtained. The right anterior chest was prepped with chlorhexidine and sterile field was created. Attention was initially directed to the more inferior or caudal anterior chest wall collection. Skin was anesthetized with 1% lidocaine. Using ultrasound guidance, an 18 gauge trocar needle was directed into the collection and pink purulent fluid was aspirated. Superstiff Amplatz wire  was advanced into the complex collection and the tract was dilated to accommodate a 10 [place] drain. Catheter was sutured to the skin and attached to a suction bulb. A sample of fluid was sent for culture. A second area more cephalad was targeted with ultrasound guidance. Skin was anesthetized with 1% lidocaine. Using ultrasound guidance, an 18 gauge trocar needle was directed into the complex collection. Again, purulent fluid was aspirated. Superstiff Amplatz wire was advanced into this collection and a 10 French drain was placed. Additional purulent fluid was aspirated and the catheter was sutured to skin and attached to a suction bulb. Dressings were placed over both drains. FINDINGS: Patient has palpable hematomas in the right anterior chest and upper abdomen region. Ultrasound demonstrates heterogeneous collections in both areas containing a small amount of compressible fluid. There are echogenic areas within the collections compatible with known gas. 10 French drains were placed in both areas and purulent fluid was draining from both collections. Both collections are very complex and compatible with infected hematomas. IMPRESSION: Ultrasound-guided placement of 2 percutaneous drains within the superficial right anterior chest wall collections. Findings are compatible with infected hematomas. Fluid sample was sent for culture. Electronically Signed   By: Markus Daft M.D.   On: 03/14/2021 18:37   DG Chest Port 1 View  Result Date: 03/13/2021 CLINICAL DATA:  Sepsis, right  rib pain. EXAM: PORTABLE CHEST 1 VIEW COMPARISON:  March 09, 2021. FINDINGS: The heart size and mediastinal contours are within normal limits. Both lungs are clear. No pneumothorax or pleural effusion is noted. The visualized skeletal structures are unremarkable. IMPRESSION: No active disease. Electronically Signed   By: Marijo Conception M.D.   On: 03/13/2021 14:11   DG Chest Portable 1 View  Result Date: 03/09/2021 CLINICAL DATA:  Chest  pain. Chest Arctic hurting late yesterday. Complains of right-sided sternal pain and chest pain. EXAM: PORTABLE CHEST 1 VIEW COMPARISON:  None. FINDINGS: The heart size and mediastinal contours are within normal limits. Both lungs are clear. Blunting of the left costophrenic angle is noted, new from prior studies. The visualized skeletal structures are unremarkable. IMPRESSION: New blunting of left costophrenic angle may reflect small effusion. The lungs are otherwise clear. No signs of interstitial edema. Electronically Signed   By: Kerby Moors M.D.   On: 03/09/2021 11:26                                                                      PROGRESS NOTE                                                                                                                                                                                                             Patient Demographics:    Brian Malone, is a 70 y.o. male, DOB - [birth year], [id number]:[id number]  Outpatient Primary MD for the patient is Brian Malone, [name], MD    LOS - 2  Admit date - 03/13/2021    Chief Complaint  Patient presents with  . Rib Injury       Brief Narrative (HPI from H&P)  70 year old male with past medical history of hepatitis C, hypertension, hyperlipidemia, diabetes mellitus type 2, chronic pain syndrome, intravenous drug use (abstinent since 2019), major depressive disorder and HIV who had a trip and fall in his yard on 02/22/2021 after which he hurt his right-sided chest wall, at that time he was diagnosed with right-sided rib cage fracture and injury, he was seen in the ER a few times since then and was treated conservatively, his pain and discomfort continued to get worse and he presented again to [facility] on  03/13/2021  CT scan suggested that he had right chest wall abscess formation and cellulitis with concerns for sepsis.   Subjective:    Brian Malone today has, No headache, No shortness of breath,  continues to have right-sided rib cage pain and discomfort, no abdominal pain no nausea   Assessment  & Plan :     1. Sepsis due to right-sided chest wall abscess formation after mechanical fall and multiple right rib fractures sustained on 02/22/2021 - he being treated with empiric IV antibiotics along with IV fluids, currently sepsis pathophysiology has improved, Dr. Kipp Brood cardiothoracic surgery was consulted in the ER who suggested ultrasound-guided drainage of the abscess by IR, this was done by IR on 03/14/2021, infected hematoma/abscess which was drained, 2 JP drains in place on 03/15/2021, continue IV antibiotics for now, likely growing staph aureus-follow final cultures.  Avoid overuse of narcotics  Encouraged the patient to sit up in chair in the daytime use I-S and flutter valve for pulmonary toiletry.  Will advance activity and titrate down oxygen as possible.  SpO2: 100 % O2 Flow Rate (L/min): 2 L/min   2.  Chronic pain and narcotic use.  Supportive care as needed for acute discomfort, avoid overuse, as needed Narcan added.  Patient counseled.  3.  Continue home regimen follows with [facility] ID department.  4.  Anxiety and depression.  Home medications continued.  Currently not homicidal or suicidal.  5.  GERD.  On PPI.  6.  BPH.  On Flomax.  7.  AKI.  Improved after IV fluids likely due to ATN from sepsis.  8.  Hypertension.  Blood pressure soft, skipped today's blood pressure medications will monitor closely.   9.  DM type II.  On sliding scale monitor and adjust  Lab Results  Component Value Date   HGBA1C 8.3 (H) 03/14/2021   CBG (last 3)  Recent Labs    03/14/21 1737 03/14/21 2136 03/15/21 0747  GLUCAP 130* 94 87          Condition - Extremely Guarded  Family Communication  : None present bedside  Code Status :  Full  Consults  :  IR  PUD Prophylaxis : PPI   Procedures  :     US guided chest wall abscess drined by IR 03/14/21   CT -   Increasing size of the right chest wall collections, primarily centered around the right sixth and eighth anterolateral ribs, with development of air bubbles. Those ribs show a pattern of irregular destruction that have progressed and look more like pathologic fractures due to infection rather than traumatic fractures. Air bubbles are now present within the collections and they probably represent abscesses. There is mild extrapleural involvement on the inner side of the ribcage, without frank breakthrough into the pleural space. The majority of the collections are on the outer side of the ribcage. No new areas of involvement are seen.      Disposition Plan  :    Status is: Inpatient  Remains inpatient appropriate because:IV treatments appropriate due to intensity of illness or inability to take PO   Dispo: The patient is from: Home              Anticipated d/c is to: Home              Patient currently is not medically stable to d/c.   Difficult to place patient No  DVT Prophylaxis  :   Heparin   Lab Results  Component Value Date  PLT 209 03/15/2021    Diet :  Diet Order            DIET SOFT Room service appropriate? Yes; Fluid consistency: Thin  Diet effective now                  Inpatient Medications  Scheduled Meds: . abacavir-dolutegravir-lamiVUDine  1 tablet Oral Daily  . busPIRone  30 mg Oral BID  . Chlorhexidine Gluconate Cloth  6 each Topical Q0600  . clonazepam  0.25 mg Oral BID  . ezetimibe  10 mg Oral Daily  . feeding supplement (GLUCERNA SHAKE)  237 mL Oral TID BM  . heparin injection (subcutaneous)  5,000 Units Subcutaneous Q8H  . insulin aspart  0-15 Units Subcutaneous TID AC & HS  . isosorbide mononitrate  30 mg Oral Daily  . multivitamin with minerals  1 tablet Oral Daily  . mupirocin ointment  1 application Nasal BID  . pantoprazole  40 mg Oral QAC breakfast  . tamsulosin  0.4 mg Oral QHS  . tiZANidine  2 mg Oral QHS   Continuous Infusions: .  sodium chloride Stopped (03/13/21 2106)  . lactated ringers    . meropenem (MERREM) IV 1 g (03/15/21 0844)  . vancomycin     PRN Meds:.sodium chloride, acetaminophen **OR** [DISCONTINUED] acetaminophen, HYDROmorphone (DILAUDID) injection **OR** [DISCONTINUED]  HYDROmorphone (DILAUDID) injection, naLOXone (NARCAN)  injection, [DISCONTINUED] ondansetron **OR** ondansetron (ZOFRAN) IV, polyethylene glycol, traMADol, valACYclovir  Antibiotics  :    Anti-infectives (From admission, onward)   Start     Dose/Rate Route Frequency Ordered Stop   03/15/21 1200  vancomycin (VANCOREADY) IVPB 1500 mg/300 mL        1,500 mg 150 mL/hr over 120 Minutes Intravenous Every 24 hours 03/15/21 0732     03/14/21 1200  vancomycin (VANCOREADY) IVPB 1250 mg/250 mL  Status:  Discontinued        1,250 mg 166.7 mL/hr over 90 Minutes Intravenous Every 24 hours 03/13/21 1359 03/15/21 0732   03/14/21 1023  valACYclovir (VALTREX) tablet 1,000 mg       Note to Pharmacy: Trig neuralgia flare     1,000 mg Oral 3 times daily PRN 03/14/21 1023     03/14/21 1000  abacavir-dolutegravir-lamiVUDine (TRIUMEQ) 600-50-300 MG per tablet 1 tablet        1 tablet Oral Daily 03/13/21 2320     03/14/21 0200  meropenem (MERREM) 1 g in sodium chloride 0.9 % 100 mL IVPB        1 g 200 mL/hr over 30 Minutes Intravenous Every 8 hours 03/13/21 2337     03/14/21 0100  ceFEPIme (MAXIPIME) 2 g in sodium chloride 0.9 % 100 mL IVPB  Status:  Discontinued       Note to Pharmacy: Cefepime 2 g IV q12h for CrCl < 60 mL/min   2 g 200 mL/hr over 30 Minutes Intravenous Every 12 hours 03/13/21 2326 03/13/21 2331   03/13/21 2000  piperacillin-tazobactam (ZOSYN) IVPB 3.375 g  Status:  Discontinued        3.375 g 12.5 mL/hr over 240 Minutes Intravenous Every 8 hours 03/13/21 1402 03/13/21 2320   03/13/21 1430  clindamycin (CLEOCIN) IVPB 900 mg        900 mg 100 mL/hr over 30 Minutes Intravenous  Once 03/13/21 1418 03/13/21 1548   03/13/21 1230   vancomycin (VANCOCIN) IVPB 1000 mg/200 mL premix        1,000 mg 200 mL/hr over 60 Minutes Intravenous  Once 03/13/21  1229 03/13/21 1506   03/13/21 1230  piperacillin-tazobactam (ZOSYN) IVPB 3.375 g        3.375 g 100 mL/hr over 30 Minutes Intravenous  Once 03/13/21 1229 03/13/21 1400       Time Spent in minutes  30   Lala Lund M.D on 03/15/2021 at 10:26 AM  To page go to [email or web address]   [facility] -  Office  [phone]    See all Orders from today for further details    Objective:   Vitals:   03/14/21 1320 03/14/21 1417 03/14/21 1956 03/15/21 0508  BP: 94/62 105/63 135/78 125/74  Pulse: 93 96 94 88  Resp: 14 12 14 20   Temp: 97.9 F (36.6 C) 98.2 F (36.8 C) 98.4 F (36.9 C) 98.3 F (36.8 C)  TempSrc: Oral Oral Oral Oral  SpO2: 100% 97% 97% 100%  Weight:      Height:        Wt Readings from Last 3 Encounters:  03/13/21 80.7 kg  03/09/21 77.1 kg  01/22/21 72.6 kg     Intake/Output Summary (Last 24 hours) at 03/15/2021 1026 Last data filed at 03/15/2021 0515 Gross per 24 hour  Intake 1892.65 ml  Output 400 ml  Net 1492.65 ml   Physical Exam  Awake Alert, No new F.N deficits, Normal affect [place].AT,PERRAL Supple Neck,No JVD, No cervical lymphadenopathy appriciated.  Symmetrical Chest wall movement, Good air movement bilaterally, CTAB RRR,No Gallops, Rubs or new Murmurs, No Parasternal Heave +ve B.Sounds, Abd Soft, No tenderness, No organomegaly appriciated, No rebound - guarding or rigidity.  L chest wall below on 03/14/20( now 2 JP drains)         Data Review:    CBC Recent Labs  Lab 03/09/21 1110 03/13/21 1320 03/14/21 0038 03/15/21 0220  WBC 13.1* 18.1* 15.9* 12.2*  HGB 11.7* 10.5* 10.0* 9.7*  HCT 35.2* 31.8* 29.8* 29.8*  PLT 231 262 219 209  MCV 98.3 99.1 98.0 98.3  MCH 32.7 32.7 32.9 32.0  MCHC 33.2 33.0 33.6 32.6  RDW 12.2 12.9 12.8 13.2  LYMPHSABS  --  0.5* 0.4* 0.5*  MONOABS  --  1.0 0.7 0.9  EOSABS  --  0.1 0.1  0.1  BASOSABS  --  0.0 0.0 0.0    Recent Labs  Lab 03/09/21 1110 03/13/21 1320 03/13/21 1615 03/14/21 0038 03/15/21 0220  NA 130* 131*  --  134* 134*  K 3.7 3.5  --  3.5 3.7  CL 93* 95*  --  99 100  CO2 28 25  --  28 26  GLUCOSE 283* 327*  --  157* 101*  BUN 18 32*  --  24* 17  CREATININE 1.03 1.56*  --  1.21 1.24  CALCIUM 8.4* 7.9*  --  8.1* 8.0*  AST 11* 10*  --  10* 9*  ALT 8 8  --  8 7  ALKPHOS 95 91  --  82 98  BILITOT 0.4 0.7  --  1.1 1.0  ALBUMIN 2.9* 2.2*  --  1.9* 1.7*  MG  --   --   --  1.4* 1.8  PROCALCITON  --   --   --  0.56 1.00  LATICACIDVEN  --  1.9 2.4* 1.8  --   INR  --  1.3*  --  1.3*  --   HGBA1C  --   --   --  8.3*  --     ------------------------------------------------------------------------------------------------------------------ No results for input(s): CHOL, HDL, LDLCALC, TRIG, CHOLHDL, LDLDIRECT  in the last 72 hours.  Lab Results  Component Value Date   HGBA1C 8.3 (H) 03/14/2021   ------------------------------------------------------------------------------------------------------------------ No results for input(s): TSH, T4TOTAL, T3FREE, THYROIDAB in the last 72 hours.  Invalid input(s): FREET3  Cardiac Enzymes No results for input(s): CKMB, TROPONINI, MYOGLOBIN in the last 168 hours.  Invalid input(s): CK ------------------------------------------------------------------------------------------------------------------ No results found for: BNP  Micro Results Recent Results (from the past 240 hour(s))  Urine culture     Status: None   Collection Time: 03/13/21 12:25 AM   Specimen: In/Out Cath Urine  Result Value Ref Range Status   Specimen Description IN/OUT CATH URINE  Final   Special Requests NONE  Final   Culture   Final    NO GROWTH Performed at [facility], [address], [place], [place] [address]    Report Status 03/15/2021 FINAL  Final  Blood Culture (routine x 2)     Status: None (Preliminary result)    Collection Time: 03/13/21 12:25 PM   Specimen: Right Antecubital; Blood  Result Value Ref Range Status   Specimen Description   Final    RIGHT ANTECUBITAL BLOOD Performed at [facility], [address] [place], [place] [address]    Special Requests   Final    BOTTLES DRAWN AEROBIC AND ANAEROBIC Blood Culture results may not be optimal due to an inadequate volume of blood received in culture bottles Performed at [facility], [address] [place], [place] [address]    Culture   Final    NO GROWTH < 24 HOURS Performed at [facility], [place] [address], [place], [place] [address]    Report Status PENDING  Incomplete  Blood Culture (routine x 2)     Status: None (Preliminary result)   Collection Time: 03/13/21  1:15 PM   Specimen: BLOOD RIGHT HAND  Result Value Ref Range Status   Specimen Description   Final    BLOOD RIGHT HAND BLOOD Performed at [facility], [address] [place], [place] [address]    Special Requests   Final    BOTTLES DRAWN AEROBIC AND ANAEROBIC Blood Culture results may not be optimal due to an inadequate volume of blood received in culture bottles Performed at [facility], [address] [place], [place] [address]    Culture   Final    NO GROWTH < 24 HOURS Performed at [facility], [place] [address], [place], [place] [address]    Report Status PENDING  Incomplete  Resp Panel by RT-PCR (Flu A&B, Covid) Nasopharyngeal Swab     Status: None   Collection Time: 03/13/21  2:01 PM   Specimen: Nasopharyngeal Swab; Nasopharyngeal(NP) swabs in vial transport medium  Result Value Ref Range Status   SARS Coronavirus 2 by RT PCR NEGATIVE NEGATIVE Final    Comment: (NOTE) SARS-CoV-2 target nucleic acids are NOT DETECTED.  The SARS-CoV-2 RNA is generally detectable in upper respiratory specimens during the acute phase of infection. The lowest concentration of SARS-CoV-2 viral copies this assay can detect  is 138 copies/mL. A negative result does not preclude SARS-Cov-2 infection and should not be used as the sole basis for treatment or other patient management decisions. A negative result may occur with  improper specimen collection/handling, submission of specimen other than nasopharyngeal swab, presence of viral mutation(s) within the areas targeted by this assay, and inadequate number of viral copies(<138 copies/mL). A negative result must  be combined with clinical observations, patient history, and epidemiological information. The expected result is Negative.  Fact Sheet for Patients:  EntrepreneurPulse.com.au  Fact Sheet for Healthcare Providers:  IncredibleEmployment.be  This test is no t yet approved or cleared by the [place] FDA and  has been authorized for detection and/or diagnosis of SARS-CoV-2 by FDA under an Emergency Use Authorization (EUA). This EUA will remain  in effect (meaning this test can be used) for the duration of the COVID-19 declaration under Section 564(b)(1) of the Act, 21 U.S.C.section 360bbb-3(b)(1), unless the authorization is terminated  or revoked sooner.       Influenza A by PCR NEGATIVE NEGATIVE Final   Influenza B by PCR NEGATIVE NEGATIVE Final    Comment: (NOTE) The Xpert Xpress SARS-CoV-2/FLU/RSV plus assay is intended as an aid in the diagnosis of influenza from Nasopharyngeal swab specimens and should not be used as a sole basis for treatment. Nasal washings and aspirates are unacceptable for Xpert Xpress SARS-CoV-2/FLU/RSV testing.  Fact Sheet for Patients: EntrepreneurPulse.com.au  Fact Sheet for Healthcare Providers: IncredibleEmployment.be  This test is not yet approved or cleared by the [place] FDA and has been authorized for detection and/or diagnosis of SARS-CoV-2 by FDA under an Emergency Use Authorization (EUA). This EUA will remain in effect  (meaning this test can be used) for the duration of the COVID-19 declaration under Section 564(b)(1) of the Act, 21 U.S.C. section 360bbb-3(b)(1), unless the authorization is terminated or revoked.  Performed at [facility], [address] [place], [place] [address]   MRSA PCR Screening     Status: Abnormal   Collection Time: 03/13/21 11:51 PM   Specimen: Nasal Mucosa; Nasopharyngeal  Result Value Ref Range Status   MRSA by PCR POSITIVE (A) NEGATIVE Final    Comment:        The GeneXpert MRSA Assay (FDA approved for NASAL specimens only), is one component of a comprehensive MRSA colonization surveillance program. It is not intended to diagnose MRSA infection nor to guide or monitor treatment for MRSA infections. RESULT CALLED TO, READ BACK BY AND VERIFIED WITHDorna Bloom RN 03/14/21 0432 JDW Performed at [facility] [address], [place], [place] [address]   Aerobic/Anaerobic Culture (surgical/deep wound)     Status: None (Preliminary result)   Collection Time: 03/14/21  1:23 PM   Specimen: Abscess  Result Value Ref Range Status   Specimen Description ABSCESS  Final   Special Requests NONE  Final   Gram Stain   Final    ABUNDANT WBC PRESENT, PREDOMINANTLY PMN ABUNDANT GRAM POSITIVE COCCI IN CLUSTERS    Culture   Final    ABUNDANT STAPHYLOCOCCUS AUREUS SUSCEPTIBILITIES TO FOLLOW Performed at [facility], [address] [place], [place] [address]    Report Status PENDING  Incomplete    Radiology Reports CT Chest W Contrast  Result Date: 03/13/2021 CLINICAL DATA:  Follow-up right chest wall hematoma and fractures of the right sixth through eighth ribs. Diabetes. HIV. EXAM: CT CHEST WITH CONTRAST TECHNIQUE: Multidetector CT imaging of the chest was performed during intravenous contrast administration. CONTRAST:  164mL OMNIPAQUE IOHEXOL 300 MG/ML  SOLN COMPARISON:  03/09/2021 FINDINGS: Cardiovascular: Heart size remains normal. No pericardial  fluid. Coronary artery calcification as seen previously. Aortic atherosclerotic calcification as seen previously. Mediastinum/Nodes: Normal Lungs/Pleura: Scarring/atelectasis at the lung bases left more than right unchanged since the previous study. Small areas of patchy density in the superior segment of the right  lower lobe unchanged from the study of 4 days ago. No new or progressive pulmonary finding. No pneumothorax. Upper Abdomen: No upper abdominal injury or acute finding. Musculoskeletal: There is increasing size of the right chest wall collections, primarily centered around the right sixth and eighth anterolateral ribs. Those ribs show a pattern of irregular destruction that look more like pathologic fractures due to infection rather than traumatic fractures. Air bubbles are now present within the collections. There is mild extrapleural involvement in side of the ribcage, without frank breakthrough into the pleural space. Most of the collections manifest external to the ribcage. No new areas of involvement are seen. IMPRESSION: Increasing size of the right chest wall collections, primarily centered around the right sixth and eighth anterolateral ribs, with development of air bubbles. Those ribs show a pattern of irregular destruction that have progressed and look more like pathologic fractures due to infection rather than traumatic fractures. Air bubbles are now present within the collections and they probably represent abscesses. There is mild extrapleural involvement on the inner side of the ribcage, without frank breakthrough into the pleural space. The majority of the collections are on the outer side of the ribcage. No new areas of involvement are seen. Case discussed with Dr. Regenia Skeeter at 1419 hours. Aortic Atherosclerosis (ICD10-I70.0). Electronically Signed   By: Nelson Chimes M.D.   On: 03/13/2021 14:19   CT Chest W Contrast  Result Date: 03/09/2021 CLINICAL DATA:  Fall 3 weeks prior with right  rib fractures and persistent chest pain. EXAM: CT CHEST WITH CONTRAST TECHNIQUE: Multidetector CT imaging of the chest was performed during intravenous contrast administration. CONTRAST:  73mL OMNIPAQUE IOHEXOL 300 MG/ML  SOLN COMPARISON:  Chest radiograph from earlier today. 11/22/2019 chest CT angiogram. FINDINGS: Cardiovascular: Normal heart size. No significant pericardial effusion/thickening. Three-vessel coronary atherosclerosis. Atherosclerotic nonaneurysmal thoracic aorta. Top-normal caliber main pulmonary artery (3.0 cm diameter). No central pulmonary emboli. Mediastinum/Nodes: No discrete thyroid nodules. Unremarkable esophagus. No pathologically enlarged axillary, mediastinal or hilar lymph nodes. Lungs/Pleura: No pneumothorax. No pleural effusion. No acute consolidative airspace disease, lung masses or significant pulmonary nodules. Mild platelike atelectasis in the anterior basilar left lower lobe. Mild patchy ground-glass opacity in peripheral posterior right mid lung. Upper abdomen: No acute abnormality. Musculoskeletal: No aggressive appearing focal osseous lesions. Nondisplaced acute anterior right sixth, seventh and eighth rib fractures with surrounding chest wall hematoma measuring up to 8.1 x 4.9 cm in maximum axial dimensions at the level of the anterior right sixth rib fracture (series 2/image 114). Moderate thoracic spondylosis. IMPRESSION: 1. Nondisplaced acute anterior right sixth, seventh and eighth rib fractures with surrounding chest wall hematoma. No pneumothorax or hemothorax. No discrete bone lesions. Clinical follow-up advised to ensure resolution of the right chest wall hematoma. Any need for follow-up imaging should be based on clinical assessment. 2. Mild patchy ground-glass opacity in the peripheral posterior right mid lung, nonspecific, favor mild pulmonary contusion. 3. Mild platelike atelectasis at the left lung base. 4. Three-vessel coronary atherosclerosis. 5. Aortic  Atherosclerosis (ICD10-I70.0). Electronically Signed   By: Ilona Sorrel M.D.   On: 03/09/2021 13:03   IR US Guide Bx Asp/Drain  Result Date: 03/14/2021 INDICATION: 70 year old with history of fall and right rib fractures. Patient developed right chest hematomas. Patient is complaining of pain and recent CT demonstrates gas within the hematomas. Findings are concerning for infected hematomas. EXAM: PLACEMENT OF SUPERFICIAL RIGHT ANTERIOR CHEST WALL DRAIN USING ULTRASOUND GUIDANCE x 2 MEDICATIONS: Moderate sedation ANESTHESIA/SEDATION: Fentanyl 100 mcg IV; Versed 2.0  mg IV Moderate Sedation Time:  29 minutes The patient was continuously monitored during the procedure by the interventional radiology nurse under my direct supervision. COMPLICATIONS: None immediate. PROCEDURE: Informed written consent was obtained from the patient after a thorough discussion of the procedural risks, benefits and alternatives. All questions were addressed. Maximal Sterile Barrier Technique was utilized including caps, mask, sterile gowns, sterile gloves, sterile drape, hand hygiene and skin antiseptic. A timeout was performed prior to the initiation of the procedure. The right anterior chest wall hematomas were evaluated with ultrasound. Complex collections containing a small amount of fluid were obtained. The right anterior chest was prepped with chlorhexidine and sterile field was created. Attention was initially directed to the more inferior or caudal anterior chest wall collection. Skin was anesthetized with 1% lidocaine. Using ultrasound guidance, an 18 gauge trocar needle was directed into the collection and pink purulent fluid was aspirated. Superstiff Amplatz wire was advanced into the complex collection and the tract was dilated to accommodate a 10 [place] drain. Catheter was sutured to the skin and attached to a suction bulb. A sample of fluid was sent for culture. A second area more cephalad was targeted with ultrasound  guidance. Skin was anesthetized with 1% lidocaine. Using ultrasound guidance, an 18 gauge trocar needle was directed into the complex collection. Again, purulent fluid was aspirated. Superstiff Amplatz wire was advanced into this collection and a 10 French drain was placed. Additional purulent fluid was aspirated and the catheter was sutured to skin and attached to a suction bulb. Dressings were placed over both drains. FINDINGS: Patient has palpable hematomas in the right anterior chest and upper abdomen region. Ultrasound demonstrates heterogeneous collections in both areas containing a small amount of compressible fluid. There are echogenic areas within the collections compatible with known gas. 10 French drains were placed in both areas and purulent fluid was draining from both collections. Both collections are very complex and compatible with infected hematomas. IMPRESSION: Ultrasound-guided placement of 2 percutaneous drains within the superficial right anterior chest wall collections. Findings are compatible with infected hematomas. Fluid sample was sent for culture. Electronically Signed   By: Markus Daft M.D.   On: 03/14/2021 18:37   IR US Guide Bx Asp/Drain  Result Date: 03/14/2021 INDICATION: 70 year old with history of fall and right rib fractures. Patient developed right chest hematomas. Patient is complaining of pain and recent CT demonstrates gas within the hematomas. Findings are concerning for infected hematomas. EXAM: PLACEMENT OF SUPERFICIAL RIGHT ANTERIOR CHEST WALL DRAIN USING ULTRASOUND GUIDANCE x 2 MEDICATIONS: Moderate sedation ANESTHESIA/SEDATION: Fentanyl 100 mcg IV; Versed 2.0 mg IV Moderate Sedation Time:  29 minutes The patient was continuously monitored during the procedure by the interventional radiology nurse under my direct supervision. COMPLICATIONS: None immediate. PROCEDURE: Informed written consent was obtained from the patient after a thorough discussion of the procedural  risks, benefits and alternatives. All questions were addressed. Maximal Sterile Barrier Technique was utilized including caps, mask, sterile gowns, sterile gloves, sterile drape, hand hygiene and skin antiseptic. A timeout was performed prior to the initiation of the procedure. The right anterior chest wall hematomas were evaluated with ultrasound. Complex collections containing a small amount of fluid were obtained. The right anterior chest was prepped with chlorhexidine and sterile field was created. Attention was initially directed to the more inferior or caudal anterior chest wall collection. Skin was anesthetized with 1% lidocaine. Using ultrasound guidance, an 18 gauge trocar needle was directed into the collection and pink purulent fluid was  aspirated. Superstiff Amplatz wire was advanced into the complex collection and the tract was dilated to accommodate a 10 [place] drain. Catheter was sutured to the skin and attached to a suction bulb. A sample of fluid was sent for culture. A second area more cephalad was targeted with ultrasound guidance. Skin was anesthetized with 1% lidocaine. Using ultrasound guidance, an 18 gauge trocar needle was directed into the complex collection. Again, purulent fluid was aspirated. Superstiff Amplatz wire was advanced into this collection and a 10 French drain was placed. Additional purulent fluid was aspirated and the catheter was sutured to skin and attached to a suction bulb. Dressings were placed over both drains. FINDINGS: Patient has palpable hematomas in the right anterior chest and upper abdomen region. Ultrasound demonstrates heterogeneous collections in both areas containing a small amount of compressible fluid. There are echogenic areas within the collections compatible with known gas. 10 French drains were placed in both areas and purulent fluid was draining from both collections. Both collections are very complex and compatible with infected hematomas. IMPRESSION:  Ultrasound-guided placement of 2 percutaneous drains within the superficial right anterior chest wall collections. Findings are compatible with infected hematomas. Fluid sample was sent for culture. Electronically Signed   By: Markus Daft M.D.   On: 03/14/2021 18:37   DG Chest Port 1 View  Result Date: 03/13/2021 CLINICAL DATA:  Sepsis, right rib pain. EXAM: PORTABLE CHEST 1 VIEW COMPARISON:  March 09, 2021. FINDINGS: The heart size and mediastinal contours are within normal limits. Both lungs are clear. No pneumothorax or pleural effusion is noted. The visualized skeletal structures are unremarkable. IMPRESSION: No active disease. Electronically Signed   By: Marijo Conception M.D.   On: 03/13/2021 14:11   DG Chest Portable 1 View  Result Date: 03/09/2021 CLINICAL DATA:  Chest pain. Chest Arctic hurting late yesterday. Complains of right-sided sternal pain and chest pain. EXAM: PORTABLE CHEST 1 VIEW COMPARISON:  None. FINDINGS: The heart size and mediastinal contours are within normal limits. Both lungs are clear. Blunting of the left costophrenic angle is noted, new from prior studies. The visualized skeletal structures are unremarkable. IMPRESSION: New blunting of left costophrenic angle may reflect small effusion. The lungs are otherwise clear. No signs of interstitial edema. Electronically Signed   By: Kerby Moors M.D.   On: 03/09/2021 11:26

## 2021-03-15 NOTE — Progress Notes (Signed)
Referring Physician(s): Thurnell Lose, MD  Supervising Physician: Markus Daft  Patient Status:  [facility] - In-pt  Chief Complaint:  Chest wall hematoma  Brief History:  Brian Malone is a 70 y.o. male with medical issues including hepatitis C, hypertension, hyperlipidemia, diabetes mellitus type 2, chronic pain syndrome, intravenous drug use(abstinent since 2019),major depressive disorder and HIV.  On March 6 he suffered a fall and landed directly on his right chest.   He was seen by his primary care provider 2 days later on 3/8 but imaging surprisingly revealed no evidence of rib fractures.  Due to persistent debilitating pain, he presented to a local urgent care clinic on 3/11 for repeat evaluation where x-ray was repeated of the ribs and this time revealed fractures of ribs 6, 7 and 8. He was prescribed some analgesics and discharged home at that time.  His pain continued to worsen so he presented to [place] emergency department on 3/21 where CT imaging of the chest was performed in addition to revealing the known fractures of ribs 6 7 and 8 there is identification of a chest wall hematoma and pulmonary contusion. Patient again was discharged home with additional home-going opiate-based analgesics.  On 3/25 he again presented to [place] emergency department,  CT showed developing chest wall collections with air bubbles surrounding the broken ribs concerning for abscess formations.   He is now in [facility] as a transfer from [facility] for continued right chest wall pain.  He underwent image guided aspiration and drain placement x 2 yesterday by Dr. Anselm [name].   Subjective:  States he feels a little better today. Tells me he was awake through the procedure and it was extremely painful.  Allergies: Adhesive [tape], Cyclobenzaprine, Duloxetine hcl, and Morphine  Medications: Prior to Admission medications    Medication Sig Start Date End Date Taking? Authorizing Provider  AIMOVIG 140 MG/ML SOAJ Inject 140 mg into the skin every 30 (thirty) days.  09/24/20  Yes [provider]  BELBUCA 150 MCG FILM Place 1 Film inside cheek every 12 (twelve) hours. 03/06/21  Yes [provider]  busPIRone (BUSPAR) 15 MG tablet Take 1 tablet (15 mg total) by mouth 3 (three) times daily. Patient taking differently: Take 30 mg by mouth 2 (two) times daily. 09/03/20  Yes Connye Burkitt, NP  busPIRone (BUSPAR) 30 MG tablet Take 30 mg by mouth 2 (two) times daily. 03/04/21  Yes [provider]  clonazePAM (KLONOPIN) 0.5 MG tablet Take 0.5 mg by mouth 3 (three) times daily. 10/01/19  Yes [provider]  ezetimibe (ZETIA) 10 MG tablet Take 10 mg by mouth daily.   Yes [provider]  isosorbide mononitrate (IMDUR) 60 MG 24 hr tablet TAKE 1 TABLET(60 MG) BY MOUTH DAILY Patient taking differently: Take 60 mg by mouth daily. 12/09/20  Yes Elouise Munroe, MD  loratadine (CLARITIN) 10 MG tablet Take 10 mg by mouth daily.    Yes [provider]  MAVYRET 100-40 MG TABS Take 3 tablets by mouth daily. 03/04/21  Yes [provider]  pantoprazole (PROTONIX) 40 MG tablet Take 40 mg by mouth daily before breakfast.  02/12/19  Yes [provider]  propranolol ER (INDERAL LA) 60 MG 24 hr capsule Take 60 mg by mouth daily.  08/28/20  Yes [provider]  Semaglutide,0.25 or 0.5MG /DOS, 2 MG/1.5ML SOPN Inject 0.5 mg into the skin every Friday.  10/08/19  Yes  [provider]  tamsulosin (FLOMAX) 0.4 MG CAPS capsule Take 0.4 mg by mouth at bedtime.  02/12/19  Yes [provider]  tiZANidine (ZANAFLEX) 4 MG tablet Take 4 mg by mouth at bedtime.  03/08/19  Yes [provider]  TRIUMEQ 600-50-300 MG tablet Take 1 tablet by mouth daily.  02/12/19  Yes [provider]  valACYclovir (VALTREX) 1000 MG tablet Take 1,000 mg by mouth 3 (three)  times daily as needed (as directed for trigeminal neuralgia flares). Trig neuralgia flare 10/16/19  Yes [provider]     Vital Signs: BP 125/74 (BP Location: Left Arm)   Pulse 88   Temp 98.3 F (36.8 C) (Oral)   Resp 20   Ht 6' (1.829 m)   Wt 80.7 kg   SpO2 100%   BMI 24.13 kg/m   Physical Exam Constitutional:      Appearance: Normal appearance.  HENT:     Head: Normocephalic and atraumatic.  Cardiovascular:     Rate and Rhythm: Normal rate.  Pulmonary:     Effort: Pulmonary effort is normal. No respiratory distress.  Musculoskeletal:     Comments: Right chest with drain x 2. Drain #1 will not stay charged but there is a small amount of drainage in the bulb so it appears it is draining via gravity. ~ 40 mL output. Drain #2 with purulent tan drain in the bulb.~60 mL output.  Skin:    General: Skin is warm and dry.  Neurological:     General: No focal deficit present.     Mental Status: He is alert and oriented to person, place, and time.  Psychiatric:        Mood and Affect: Mood normal.        Behavior: Behavior normal.        Thought Content: Thought content normal.        Judgment: Judgment normal.     Imaging: CT Chest W Contrast  Result Date: 03/13/2021 CLINICAL DATA:  Follow-up right chest wall hematoma and fractures of the right sixth through eighth ribs. Diabetes. HIV. EXAM: CT CHEST WITH CONTRAST TECHNIQUE: Multidetector CT imaging of the chest was performed during intravenous contrast administration. CONTRAST:  132mL OMNIPAQUE IOHEXOL 300 MG/ML  SOLN COMPARISON:  03/09/2021 FINDINGS: Cardiovascular: Heart size remains normal. No pericardial fluid. Coronary artery calcification as seen previously. Aortic atherosclerotic calcification as seen previously. Mediastinum/Nodes: Normal Lungs/Pleura: Scarring/atelectasis at the lung bases left more than right unchanged since the previous study. Small areas of patchy density in the superior segment of the right  lower lobe unchanged from the study of 4 days ago. No new or progressive pulmonary finding. No pneumothorax. Upper Abdomen: No upper abdominal injury or acute finding. Musculoskeletal: There is increasing size of the right chest wall collections, primarily centered around the right sixth and eighth anterolateral ribs. Those ribs show a pattern of irregular destruction that look more like pathologic fractures due to infection rather than traumatic fractures. Air bubbles are now present within the collections. There is mild extrapleural involvement in side of the ribcage, without frank breakthrough into the pleural space. Most of the collections manifest external to the ribcage. No new areas of involvement are seen. IMPRESSION: Increasing size of the right chest wall collections, primarily centered around the right sixth and eighth anterolateral ribs, with development of air bubbles. Those ribs show a pattern of irregular destruction that have progressed and look more like pathologic fractures due to infection rather than traumatic fractures. Pharmacist, community  bubbles are now present within the collections and they probably represent abscesses. There is mild extrapleural involvement on the inner side of the ribcage, without frank breakthrough into the pleural space. The majority of the collections are on the outer side of the ribcage. No new areas of involvement are seen. Case discussed with Dr. Regenia Skeeter at 1419 hours. Aortic Atherosclerosis (ICD10-I70.0). Electronically Signed   By: Nelson Chimes M.D.   On: 03/13/2021 14:19   IR US Guide Bx Asp/Drain  Result Date: 03/14/2021 INDICATION: 70 year old with history of fall and right rib fractures. Patient developed right chest hematomas. Patient is complaining of pain and recent CT demonstrates gas within the hematomas. Findings are concerning for infected hematomas. EXAM: PLACEMENT OF SUPERFICIAL RIGHT ANTERIOR CHEST WALL DRAIN USING ULTRASOUND GUIDANCE x 2 MEDICATIONS: Moderate  sedation ANESTHESIA/SEDATION: Fentanyl 100 mcg IV; Versed 2.0 mg IV Moderate Sedation Time:  29 minutes The patient was continuously monitored during the procedure by the interventional radiology nurse under my direct supervision. COMPLICATIONS: None immediate. PROCEDURE: Informed written consent was obtained from the patient after a thorough discussion of the procedural risks, benefits and alternatives. All questions were addressed. Maximal Sterile Barrier Technique was utilized including caps, mask, sterile gowns, sterile gloves, sterile drape, hand hygiene and skin antiseptic. A timeout was performed prior to the initiation of the procedure. The right anterior chest wall hematomas were evaluated with ultrasound. Complex collections containing a small amount of fluid were obtained. The right anterior chest was prepped with chlorhexidine and sterile field was created. Attention was initially directed to the more inferior or caudal anterior chest wall collection. Skin was anesthetized with 1% lidocaine. Using ultrasound guidance, an 18 gauge trocar needle was directed into the collection and pink purulent fluid was aspirated. Superstiff Amplatz wire was advanced into the complex collection and the tract was dilated to accommodate a 10 [place] drain. Catheter was sutured to the skin and attached to a suction bulb. A sample of fluid was sent for culture. A second area more cephalad was targeted with ultrasound guidance. Skin was anesthetized with 1% lidocaine. Using ultrasound guidance, an 18 gauge trocar needle was directed into the complex collection. Again, purulent fluid was aspirated. Superstiff Amplatz wire was advanced into this collection and a 10 French drain was placed. Additional purulent fluid was aspirated and the catheter was sutured to skin and attached to a suction bulb. Dressings were placed over both drains. FINDINGS: Patient has palpable hematomas in the right anterior chest and upper abdomen region.  Ultrasound demonstrates heterogeneous collections in both areas containing a small amount of compressible fluid. There are echogenic areas within the collections compatible with known gas. 10 French drains were placed in both areas and purulent fluid was draining from both collections. Both collections are very complex and compatible with infected hematomas. IMPRESSION: Ultrasound-guided placement of 2 percutaneous drains within the superficial right anterior chest wall collections. Findings are compatible with infected hematomas. Fluid sample was sent for culture. Electronically Signed   By: Markus Daft M.D.   On: 03/14/2021 18:37   IR US Guide Bx Asp/Drain  Result Date: 03/14/2021 INDICATION: 70 year old with history of fall and right rib fractures. Patient developed right chest hematomas. Patient is complaining of pain and recent CT demonstrates gas within the hematomas. Findings are concerning for infected hematomas. EXAM: PLACEMENT OF SUPERFICIAL RIGHT ANTERIOR CHEST WALL DRAIN USING ULTRASOUND GUIDANCE x 2 MEDICATIONS: Moderate sedation ANESTHESIA/SEDATION: Fentanyl 100 mcg IV; Versed 2.0 mg IV Moderate Sedation Time:  29 minutes The  patient was continuously monitored during the procedure by the interventional radiology nurse under my direct supervision. COMPLICATIONS: None immediate. PROCEDURE: Informed written consent was obtained from the patient after a thorough discussion of the procedural risks, benefits and alternatives. All questions were addressed. Maximal Sterile Barrier Technique was utilized including caps, mask, sterile gowns, sterile gloves, sterile drape, hand hygiene and skin antiseptic. A timeout was performed prior to the initiation of the procedure. The right anterior chest wall hematomas were evaluated with ultrasound. Complex collections containing a small amount of fluid were obtained. The right anterior chest was prepped with chlorhexidine and sterile field was created. Attention was  initially directed to the more inferior or caudal anterior chest wall collection. Skin was anesthetized with 1% lidocaine. Using ultrasound guidance, an 18 gauge trocar needle was directed into the collection and pink purulent fluid was aspirated. Superstiff Amplatz wire was advanced into the complex collection and the tract was dilated to accommodate a 10 [place] drain. Catheter was sutured to the skin and attached to a suction bulb. A sample of fluid was sent for culture. A second area more cephalad was targeted with ultrasound guidance. Skin was anesthetized with 1% lidocaine. Using ultrasound guidance, an 18 gauge trocar needle was directed into the complex collection. Again, purulent fluid was aspirated. Superstiff Amplatz wire was advanced into this collection and a 10 French drain was placed. Additional purulent fluid was aspirated and the catheter was sutured to skin and attached to a suction bulb. Dressings were placed over both drains. FINDINGS: Patient has palpable hematomas in the right anterior chest and upper abdomen region. Ultrasound demonstrates heterogeneous collections in both areas containing a small amount of compressible fluid. There are echogenic areas within the collections compatible with known gas. 10 French drains were placed in both areas and purulent fluid was draining from both collections. Both collections are very complex and compatible with infected hematomas. IMPRESSION: Ultrasound-guided placement of 2 percutaneous drains within the superficial right anterior chest wall collections. Findings are compatible with infected hematomas. Fluid sample was sent for culture. Electronically Signed   By: Markus Daft M.D.   On: 03/14/2021 18:37   DG Chest Port 1 View  Result Date: 03/13/2021 CLINICAL DATA:  Sepsis, right rib pain. EXAM: PORTABLE CHEST 1 VIEW COMPARISON:  March 09, 2021. FINDINGS: The heart size and mediastinal contours are within normal limits. Both lungs are clear. No  pneumothorax or pleural effusion is noted. The visualized skeletal structures are unremarkable. IMPRESSION: No active disease. Electronically Signed   By: Marijo Conception M.D.   On: 03/13/2021 14:11    Labs:  CBC: Recent Labs    03/09/21 1110 03/13/21 1320 03/14/21 0038 03/15/21 0220  WBC 13.1* 18.1* 15.9* 12.2*  HGB 11.7* 10.5* 10.0* 9.7*  HCT 35.2* 31.8* 29.8* 29.8*  PLT 231 262 219 209    COAGS: Recent Labs    11/03/20 1355 03/13/21 1320 03/14/21 0038  INR 1.0 1.3* 1.3*  APTT  --  37*  --     BMP: Recent Labs    06/02/20 2257 06/18/20 1918 09/01/20 2000 10/22/20 1629 03/09/21 1110 03/13/21 1320 03/14/21 0038 03/15/21 0220  NA 144 139 139   < > 130* 131* 134* 134*  K 3.4* 4.2 4.3   < > 3.7 3.5 3.5 3.7  CL 109 109 101   < > 93* 95* 99 100  CO2 25 21* 29   < > 28 25 28 26   GLUCOSE 114* 102* 190*   < >  283* 327* 157* 101*  BUN 23 16 14    < > 18 32* 24* 17  CALCIUM 8.6* 8.8* 8.4*   < > 8.4* 7.9* 8.1* 8.0*  CREATININE 1.30* 1.45* 1.15   < > 1.03 1.56* 1.21 1.24  GFRNONAA 57* 50* >60   < > >60 48* >60 >60  GFRAA >60 58* >60  --   --   --   --   --    < > = values in this interval not displayed.    LIVER FUNCTION TESTS: Recent Labs    03/09/21 1110 03/13/21 1320 03/14/21 0038 03/15/21 0220  BILITOT 0.4 0.7 1.1 1.0  AST 11* 10* 10* 9*  ALT 8 8 8 7   ALKPHOS 95 91 82 98  PROT 6.7 6.0* 5.5* 5.3*  ALBUMIN 2.9* 2.2* 1.9* 1.7*    Assessment and Plan:  Infected hematoma right chest wall  S/P drains x 2 by Dr. Anselm [name].  Continue routine drain care. May need to switch #1 bulb to gravity bag.  Electronically Signed: Murrell Redden, PA-C 03/15/2021, 12:59 PM    I spent a total of 15 Minutes at the the patient's bedside AND on the patient's hospital floor or unit, greater than 50% of which was counseling/coordinating care for f/y chest wall drains.
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
## 2021-03-15 NOTE — Progress Notes (Signed)
Pharmacy Antibiotic Note  Brian Malone is a 70 y.o. male admitted on 03/13/2021 with infected hematomas of the right anterior chest / rib fractures.  Now s/p drain placement x 2. Scr 1.24, improved WBC WNL Cultures pending   Plan: Increase Vancomycin to 1500 mg iv  q 24 hours F/u renal fxn, C&S, clinical status and peak/trough at SS  Height: 6' (182.9 cm) Weight: 80.7 kg (177 lb 14.6 oz) IBW/kg (Calculated) : 77.6  Temp (24hrs), Avg:98.2 F (36.8 C), Min:97.9 F (36.6 C), Max:98.4 F (36.9 C)  Recent Labs  Lab 03/09/21 1110 03/13/21 1320 03/13/21 1615 03/14/21 0038 03/15/21 0220  WBC 13.1* 18.1*  --  15.9* 12.2*  CREATININE 1.03 1.56*  --  1.21 1.24  LATICACIDVEN  --  1.9 2.4* 1.8  --     Estimated Creatinine Clearance: 63.4 mL/min (by C-G formula based on SCr of 1.24 mg/dL).    Allergies  Allergen Reactions  . Adhesive [Tape] Other (See Comments)    "Tape will take off my skin, as will Band-Aids"  . Cyclobenzaprine Other (See Comments)    Hallucinations  . Duloxetine Hcl Other (See Comments)    Makes PTSD- induced nightmares more vivid   . Morphine Itching and Other (See Comments)    Hallucinations, aggression, and makes patient altered also       Thank you for allowing pharmacy to be a part of this patient's care. Anette Guarneri, PharmD 03/15/2021 7:32 AM

## 2021-03-16 DIAGNOSIS — R6520 Severe sepsis without septic shock: Secondary | ICD-10-CM | POA: Diagnosis not present

## 2021-03-16 DIAGNOSIS — N179 Acute kidney failure, unspecified: Secondary | ICD-10-CM | POA: Diagnosis not present

## 2021-03-16 DIAGNOSIS — A419 Sepsis, unspecified organism: Secondary | ICD-10-CM | POA: Diagnosis not present

## 2021-03-16 LAB — CBC WITH DIFFERENTIAL/PLATELET
Abs Immature Granulocytes: 0.11 10*3/uL — ABNORMAL HIGH (ref 0.00–0.07)
Basophils Absolute: 0 10*3/uL (ref 0.0–0.1)
Basophils Relative: 0 %
Eosinophils Absolute: 0.1 10*3/uL (ref 0.0–0.5)
Eosinophils Relative: 1 %
HCT: 30.9 % — ABNORMAL LOW (ref 39.0–52.0)
Hemoglobin: 10.2 g/dL — ABNORMAL LOW (ref 13.0–17.0)
Immature Granulocytes: 1 %
Lymphocytes Relative: 6 %
Lymphs Abs: 0.8 10*3/uL (ref 0.7–4.0)
MCH: 32.5 pg (ref 26.0–34.0)
MCHC: 33 g/dL (ref 30.0–36.0)
MCV: 98.4 fL (ref 80.0–100.0)
Monocytes Absolute: 1.2 10*3/uL — ABNORMAL HIGH (ref 0.1–1.0)
Monocytes Relative: 11 %
Neutro Abs: 9.5 10*3/uL — ABNORMAL HIGH (ref 1.7–7.7)
Neutrophils Relative %: 81 %
Platelets: 219 10*3/uL (ref 150–400)
RBC: 3.14 MIL/uL — ABNORMAL LOW (ref 4.22–5.81)
RDW: 13 % (ref 11.5–15.5)
WBC: 11.7 10*3/uL — ABNORMAL HIGH (ref 4.0–10.5)
nRBC: 0 % (ref 0.0–0.2)

## 2021-03-16 LAB — COMPREHENSIVE METABOLIC PANEL
ALT: 7 U/L (ref 0–44)
AST: 12 U/L — ABNORMAL LOW (ref 15–41)
Albumin: 1.6 g/dL — ABNORMAL LOW (ref 3.5–5.0)
Alkaline Phosphatase: 93 U/L (ref 38–126)
Anion gap: 6 (ref 5–15)
BUN: 17 mg/dL (ref 8–23)
CO2: 26 mmol/L (ref 22–32)
Calcium: 7.8 mg/dL — ABNORMAL LOW (ref 8.9–10.3)
Chloride: 102 mmol/L (ref 98–111)
Creatinine, Ser: 1.2 mg/dL (ref 0.61–1.24)
GFR, Estimated: >60 mL/min (ref >60)
Glucose, Bld: 130 mg/dL — ABNORMAL HIGH (ref 70–99)
Potassium: 3.2 mmol/L — ABNORMAL LOW (ref 3.5–5.1)
Sodium: 134 mmol/L — ABNORMAL LOW (ref 135–145)
Total Bilirubin: 0.8 mg/dL (ref 0.3–1.2)
Total Protein: 5.3 g/dL — ABNORMAL LOW (ref 6.5–8.1)

## 2021-03-16 LAB — GLUCOSE, CAPILLARY
Glucose-Capillary: 138 mg/dL — ABNORMAL HIGH (ref 70–99)
Glucose-Capillary: 132 mg/dL — ABNORMAL HIGH (ref 70–99)
Glucose-Capillary: 143 mg/dL — ABNORMAL HIGH (ref 70–99)

## 2021-03-16 LAB — PROCALCITONIN: Procalcitonin: 0.68 ng/mL

## 2021-03-16 LAB — MAGNESIUM: Magnesium: 1.7 mg/dL (ref 1.7–2.4)

## 2021-03-16 MED ORDER — BIOTENE DRY MOUTH MT LIQD: 15.0000 mL | OROMUCOSAL | Status: DC | PRN

## 2021-03-16 MED ORDER — MAGNESIUM SULFATE IN D5W 1-5 GM/100ML-% IV SOLN
1.0000 g | Freq: Once | INTRAVENOUS | Status: AC
  Administered 2021-03-16: 1 g via INTRAVENOUS
  Filled 2021-03-16: qty 100

## 2021-03-16 MED ORDER — POTASSIUM CHLORIDE CRYS ER 20 MEQ PO TBCR
40.0000 meq | EXTENDED_RELEASE_TABLET | Freq: Two times a day (BID) | ORAL | Status: AC
  Administered 2021-03-16 (×2): 40 meq via ORAL
  Filled 2021-03-16 (×2): qty 2

## 2021-03-16 MED ORDER — CLONAZEPAM 0.5 MG PO TBDP
0.5000 mg | ORAL_TABLET | Freq: Three times a day (TID) | ORAL | Status: DC
  Administered 2021-03-16 – 2021-03-31 (×43): 0.5 mg via ORAL
  Filled 2021-03-16 (×43): qty 1

## 2021-03-16 MED ORDER — TRAMADOL HCL 50 MG PO TABS: 100.0000 mg | ORAL_TABLET | Freq: Four times a day (QID) | ORAL | Status: DC | PRN

## 2021-03-16 MED ORDER — SODIUM CHLORIDE 0.9% FLUSH
5.0000 mL | Freq: Three times a day (TID) | INTRAVENOUS | Status: DC
  Administered 2021-03-16 – 2021-03-26 (×27): 5 mL

## 2021-03-16 MED ORDER — HYDROCODONE-ACETAMINOPHEN 5-325 MG PO TABS
1.0000 | ORAL_TABLET | Freq: Four times a day (QID) | ORAL | Status: DC | PRN
  Administered 2021-03-16 – 2021-03-17 (×3): 1 via ORAL
  Filled 2021-03-16 (×3): qty 1

## 2021-03-16 NOTE — Progress Notes (Signed)
Patient removed drain #1. Patient was pulling off the heart monitor cords and then pulled the drain out. Notified MD

## 2021-03-16 NOTE — Progress Notes (Signed)
Patient requesting his Klonopin and pain medication at once. Educated patient about need to wait an hour in between each medication due to the medication decreasing  respiratory drive. Patient stated he took all his pain medication and klonopin together at home. Again educated patient on taking all these medications safely.

## 2021-03-16 NOTE — Progress Notes (Signed)
PROGRESS NOTE                                                                                                                                                                                                             Patient Demographics:    Brian Malone, is a 70 y.o. male, DOB - 1951/01/21, UKG:[id number]  Outpatient Primary MD for the patient is [name], [name], MD    LOS - 3  Admit date - 03/13/2021    Chief Complaint  Patient presents with  . Rib Injury       Brief Narrative (HPI from H&P)  70 year old male with past medical history of hepatitis C, hypertension, hyperlipidemia, diabetes mellitus type 2, chronic pain syndrome, intravenous drug use (abstinent since 2019), major depressive disorder and HIV who had a trip and fall in his yard on 02/22/2021 after which he hurt his right-sided chest wall, at that time he was diagnosed with right-sided rib cage fracture and injury, he was seen in the ER a few times since then and was treated conservatively, his pain and discomfort continued to get worse and he presented again to [place] on  03/13/2021 CT scan suggested that he had right chest wall abscess formation and cellulitis with concerns for sepsis.   Subjective:    Brian Malone today has, No headache, No shortness of breath, continues to have right-sided rib cage pain and discomfort, no abdominal pain no nausea   Assessment  & Plan :     1. Sepsis due to right-sided chest wall abscess formation after mechanical fall and multiple right rib fractures sustained on 02/22/2021 - he being treated with empiric IV antibiotics along with IV fluids, currently sepsis pathophysiology has improved, Dr. Kipp Brood cardiothoracic surgery was consulted in the ER who suggested ultrasound-guided drainage of the abscess by IR, this will be done on 03/14/2021, continue to monitor closely.  Avoid overuse of  narcotics  Encouraged the patient to sit up in chair in the daytime use I-S and flutter valve for pulmonary toiletry.  Will advance activity and titrate down oxygen as possible.  SpO2: 98 % O2 Flow Rate (L/min): 2 L/min   2.  Chronic pain and narcotic use.  Supportive care as needed for acute discomfort, avoid overuse, as needed Narcan added.  Patient counseled.  3.  Continue home  regimen follows with [facility] ID department.  4.  Anxiety and depression.  Home medications continued.  Currently not homicidal or suicidal.  5.  GERD.  On PPI.  6.  BPH.  On Flomax.  7.  AKI.  Improved after IV fluids likely due to ATN from sepsis.  8.  Hypertension.  Blood pressure soft, skipped today's blood pressure medications will monitor closely.   9.  DM type II.  On sliding scale monitor and adjust  Lab Results  Component Value Date   HGBA1C 8.3 (H) 03/14/2021   CBG (last 3)  Recent Labs    03/15/21 1737 03/15/21 2104 03/16/21 0734  GLUCAP 144* 167* 138*          Condition - Extremely Guarded  Family Communication  : None present bedside  Code Status :  Full  Consults  :  IR  PUD Prophylaxis : PPI   Procedures  :     CT -  Increasing size of the right chest wall collections, primarily centered around the right sixth and eighth anterolateral ribs, with development of air bubbles. Those ribs show a pattern of irregular destruction that have progressed and look more like pathologic fractures due to infection rather than traumatic fractures. Air bubbles are now present within the collections and they probably represent abscesses. There is mild extrapleural involvement on the inner side of the ribcage, without frank breakthrough into the pleural space. The majority of the collections are on the outer side of the ribcage. No new areas of involvement are seen.      Disposition Plan  :    Status is: Inpatient  Remains inpatient appropriate because:IV treatments appropriate due  to intensity of illness or inability to take PO   Dispo: The patient is from: Home              Anticipated d/c is to: Home              Patient currently is not medically stable to d/c.   Difficult to place patient No  DVT Prophylaxis  :   Heparin   Lab Results  Component Value Date   PLT 219 03/16/2021    Diet :  Diet Order            DIET SOFT Room service appropriate? Yes with Assist; Fluid consistency: Thin  Diet effective now                  Inpatient Medications  Scheduled Meds: . abacavir-dolutegravir-lamiVUDine  1 tablet Oral Daily  . busPIRone  30 mg Oral BID  . Chlorhexidine Gluconate Cloth  6 each Topical Q0600  . clonazepam  0.25 mg Oral BID  . ezetimibe  10 mg Oral Daily  . feeding supplement (GLUCERNA SHAKE)  237 mL Oral TID BM  . heparin injection (subcutaneous)  5,000 Units Subcutaneous Q8H  . insulin aspart  0-15 Units Subcutaneous TID AC & HS  . isosorbide mononitrate  30 mg Oral Daily  . multivitamin with minerals  1 tablet Oral Daily  . mupirocin ointment  1 application Nasal BID  . pantoprazole  40 mg Oral QAC breakfast  . potassium chloride  40 mEq Oral BID  . tamsulosin  0.4 mg Oral QHS  . tiZANidine  2 mg Oral QHS   Continuous Infusions: . sodium chloride Stopped (03/13/21 2106)  . lactated ringers 50 mL/hr at 03/15/21 1212  . vancomycin Stopped (03/15/21 1416)   PRN Meds:.sodium chloride, acetaminophen **OR** [DISCONTINUED]  acetaminophen, HYDROmorphone (DILAUDID) injection **OR** [DISCONTINUED]  HYDROmorphone (DILAUDID) injection, naLOXone (NARCAN)  injection, [DISCONTINUED] ondansetron **OR** ondansetron (ZOFRAN) IV, polyethylene glycol, traMADol, valACYclovir  Antibiotics  :    Anti-infectives (From admission, onward)   Start     Dose/Rate Route Frequency Ordered Stop   03/15/21 1200  vancomycin (VANCOREADY) IVPB 1500 mg/300 mL        1,500 mg 150 mL/hr over 120 Minutes Intravenous Every 24 hours 03/15/21 0732     03/14/21 1200   vancomycin (VANCOREADY) IVPB 1250 mg/250 mL  Status:  Discontinued        1,250 mg 166.7 mL/hr over 90 Minutes Intravenous Every 24 hours 03/13/21 1359 03/15/21 0732   03/14/21 1023  valACYclovir (VALTREX) tablet 1,000 mg       Note to Pharmacy: Trig neuralgia flare     1,000 mg Oral 3 times daily PRN 03/14/21 1023     03/14/21 1000  abacavir-dolutegravir-lamiVUDine (TRIUMEQ) 600-50-300 MG per tablet 1 tablet        1 tablet Oral Daily 03/13/21 2320     03/14/21 0200  meropenem (MERREM) 1 g in sodium chloride 0.9 % 100 mL IVPB  Status:  Discontinued        1 g 200 mL/hr over 30 Minutes Intravenous Every 8 hours 03/13/21 2337 03/16/21 0719   03/14/21 0100  ceFEPIme (MAXIPIME) 2 g in sodium chloride 0.9 % 100 mL IVPB  Status:  Discontinued       Note to Pharmacy: Cefepime 2 g IV q12h for CrCl < 60 mL/min   2 g 200 mL/hr over 30 Minutes Intravenous Every 12 hours 03/13/21 2326 03/13/21 2331   03/13/21 2000  piperacillin-tazobactam (ZOSYN) IVPB 3.375 g  Status:  Discontinued        3.375 g 12.5 mL/hr over 240 Minutes Intravenous Every 8 hours 03/13/21 1402 03/13/21 2320   03/13/21 1430  clindamycin (CLEOCIN) IVPB 900 mg        900 mg 100 mL/hr over 30 Minutes Intravenous  Once 03/13/21 1418 03/13/21 1548   03/13/21 1230  vancomycin (VANCOCIN) IVPB 1000 mg/200 mL premix        1,000 mg 200 mL/hr over 60 Minutes Intravenous  Once 03/13/21 1229 03/13/21 1506   03/13/21 1230  piperacillin-tazobactam (ZOSYN) IVPB 3.375 g        3.375 g 100 mL/hr over 30 Minutes Intravenous  Once 03/13/21 1229 03/13/21 1400       Time Spent in minutes  30   Lala Lund M.D on 03/16/2021 at 9:44 AM  To page go to [email or web address]   [facility] -  Office  [phone]    See all Orders from today for further details    Objective:   Vitals:   03/15/21 2029 03/15/21 2200 03/16/21 0356 03/16/21 0357  BP: 139/85   114/69  Pulse: (!) 103  73 71  Resp: 19  17 14   Temp: 100.2 F (37.9 C) 99.5 F  (37.5 C)  97.6 F (36.4 C)  TempSrc: Oral   Oral  SpO2: 96%  98% 98%  Weight:   85.2 kg   Height:        Wt Readings from Last 3 Encounters:  03/16/21 85.2 kg  03/09/21 77.1 kg  01/22/21 72.6 kg     Intake/Output Summary (Last 24 hours) at 03/16/2021 0944 Last data filed at 03/16/2021 0734 Gross per 24 hour  Intake 1798.55 ml  Output 1209 ml  Net 589.55 ml   Physical Exam  Awake  Alert, No new F.N deficits, Normal affect [place].AT,PERRAL Supple Neck,No JVD, No cervical lymphadenopathy appriciated.  Symmetrical Chest wall movement, Good air movement bilaterally, CTAB RRR,No Gallops,Rubs or new Murmurs, No Parasternal Heave +ve B.Sounds, Abd Soft, No tenderness, No organomegaly appriciated, No rebound - guarding or rigidity. L chest wall below on 03/14/20         Data Review:    CBC Recent Labs  Lab 03/09/21 1110 03/13/21 1320 03/14/21 0038 03/15/21 0220 03/16/21 0226  WBC 13.1* 18.1* 15.9* 12.2* 11.7*  HGB 11.7* 10.5* 10.0* 9.7* 10.2*  HCT 35.2* 31.8* 29.8* 29.8* 30.9*  PLT 231 262 219 209 219  MCV 98.3 99.1 98.0 98.3 98.4  MCH 32.7 32.7 32.9 32.0 32.5  MCHC 33.2 33.0 33.6 32.6 33.0  RDW 12.2 12.9 12.8 13.2 13.0  LYMPHSABS  --  0.5* 0.4* 0.5* 0.8  MONOABS  --  1.0 0.7 0.9 1.2*  EOSABS  --  0.1 0.1 0.1 0.1  BASOSABS  --  0.0 0.0 0.0 0.0    Recent Labs  Lab 03/09/21 1110 03/13/21 1320 03/13/21 1615 03/14/21 0038 03/15/21 0220 03/16/21 0226  NA 130* 131*  --  134* 134* 134*  K 3.7 3.5  --  3.5 3.7 3.2*  CL 93* 95*  --  99 100 102  CO2 28 25  --  28 26 26   GLUCOSE 283* 327*  --  157* 101* 130*  BUN 18 32*  --  24* 17 17  CREATININE 1.03 1.56*  --  1.21 1.24 1.20  CALCIUM 8.4* 7.9*  --  8.1* 8.0* 7.8*  AST 11* 10*  --  10* 9* 12*  ALT 8 8  --  8 7 7   ALKPHOS 95 91  --  82 98 93  BILITOT 0.4 0.7  --  1.1 1.0 0.8  ALBUMIN 2.9* 2.2*  --  1.9* 1.7* 1.6*  MG  --   --   --  1.4* 1.8 1.7  PROCALCITON  --   --   --  0.56 1.00 0.68  LATICACIDVEN  --   1.9 2.4* 1.8  --   --   INR  --  1.3*  --  1.3*  --   --   HGBA1C  --   --   --  8.3*  --   --     ------------------------------------------------------------------------------------------------------------------ No results for input(s): CHOL, HDL, LDLCALC, TRIG, CHOLHDL, LDLDIRECT in the last 72 hours.  Lab Results  Component Value Date   HGBA1C 8.3 (H) 03/14/2021   ------------------------------------------------------------------------------------------------------------------ No results for input(s): TSH, T4TOTAL, T3FREE, THYROIDAB in the last 72 hours.  Invalid input(s): FREET3  Cardiac Enzymes No results for input(s): CKMB, TROPONINI, MYOGLOBIN in the last 168 hours.  Invalid input(s): CK ------------------------------------------------------------------------------------------------------------------ No results found for: BNP  Micro Results Recent Results (from the past 240 hour(s))  Urine culture     Status: None   Collection Time: 03/13/21 12:25 AM   Specimen: In/Out Cath Urine  Result Value Ref Range Status   Specimen Description IN/OUT CATH URINE  Final   Special Requests NONE  Final   Culture   Final    NO GROWTH Performed at [facility], [address] [place], [place] [address]    Report Status 03/15/2021 FINAL  Final  Blood Culture (routine x 2)     Status: None (Preliminary result)   Collection Time: 03/13/21 12:25 PM   Specimen: Right Antecubital; Blood  Result Value Ref Range Status   Specimen  Description   Final    RIGHT ANTECUBITAL BLOOD Performed at [facility], [place], [address] [place], [place] [address]    Special Requests   Final    BOTTLES DRAWN AEROBIC AND ANAEROBIC Blood Culture results may not be optimal due to an inadequate volume of blood received in culture bottles Performed at [facility], [address] [place], [place] [address]    Culture   Final    NO GROWTH 3 DAYS Performed at [place], [place] [address] [place], [place] [address]    Report Status PENDING  Incomplete  Blood Culture (routine x 2)     Status: None (Preliminary result)   Collection Time: 03/13/21  1:15 PM   Specimen: BLOOD RIGHT HAND  Result Value Ref Range Status   Specimen Description   Final    BLOOD RIGHT HAND BLOOD Performed at [facility], [address] [place], [place] [address]    Special Requests   Final    BOTTLES DRAWN AEROBIC AND ANAEROBIC Blood Culture results may not be optimal due to an inadequate volume of blood received in culture bottles Performed at [facility], [address] [place], [place] [address]    Culture   Final    NO GROWTH 3 DAYS Performed at [facility], [place] [address], [place], [place] [address]    Report Status PENDING  Incomplete  Resp Panel by RT-PCR (Flu A&B, Covid) Nasopharyngeal Swab     Status: None   Collection Time: 03/13/21  2:01 PM   Specimen: Nasopharyngeal Swab; Nasopharyngeal(NP) swabs in vial transport medium  Result Value Ref Range Status   SARS Coronavirus 2 by RT PCR NEGATIVE NEGATIVE Final    Comment: (NOTE) SARS-CoV-2 target nucleic acids are NOT DETECTED.  The SARS-CoV-2 RNA is generally detectable in upper respiratory specimens during the acute phase of infection. The lowest concentration of SARS-CoV-2 viral copies this assay can detect is 138 copies/mL. A negative result does not preclude SARS-Cov-2 infection and should not be used as the sole basis for treatment or other patient management decisions. A negative result may occur with  improper specimen collection/handling, submission of specimen other than nasopharyngeal swab, presence of viral mutation(s) within the areas targeted by this assay, and inadequate number of viral copies(<138 copies/mL). A negative result must be combined with clinical observations, patient history, and epidemiological information. The expected result is Negative.  Fact Sheet  for Patients:  EntrepreneurPulse.com.au  Fact Sheet for Healthcare Providers:  IncredibleEmployment.be  This test is no t yet approved or cleared by the [place] FDA and  has been authorized for detection and/or diagnosis of SARS-CoV-2 by FDA under an Emergency Use Authorization (EUA). This EUA will remain  in effect (meaning this test can be used) for the duration of the COVID-19 declaration under Section 564(b)(1) of the Act, 21 U.S.C.section 360bbb-3(b)(1), unless the authorization is terminated  or revoked sooner.       Influenza A by PCR NEGATIVE NEGATIVE Final   Influenza B by PCR NEGATIVE NEGATIVE Final    Comment: (NOTE) The Xpert Xpress SARS-CoV-2/FLU/RSV plus assay is intended as an aid in the diagnosis of influenza from Nasopharyngeal swab specimens and should not be used as a sole basis for treatment. Nasal washings and aspirates are unacceptable for Xpert Xpress SARS-CoV-2/FLU/RSV testing.  Fact Sheet for Patients: EntrepreneurPulse.com.au  Fact Sheet for Healthcare Providers: IncredibleEmployment.be  This test is  not yet approved or cleared by the [place] and has been authorized for detection and/or diagnosis of SARS-CoV-2 by FDA under an Emergency Use Authorization (EUA). This EUA will remain in effect (meaning this test can be used) for the duration of the COVID-19 declaration under Section 564(b)(1) of the Act, 21 U.S.C. section 360bbb-3(b)(1), unless the authorization is terminated or revoked.  Performed at [facility], [address] [place], [place] [address]   MRSA PCR Screening     Status: Abnormal   Collection Time: 03/13/21 11:51 PM   Specimen: Nasal Mucosa; Nasopharyngeal  Result Value Ref Range Status   MRSA by PCR POSITIVE (A) NEGATIVE Final    Comment:        The GeneXpert MRSA Assay (FDA approved for NASAL specimens only), is one component of  a comprehensive MRSA colonization surveillance program. It is not intended to diagnose MRSA infection nor to guide or monitor treatment for MRSA infections. RESULT CALLED TO, READ BACK BY AND VERIFIED WITHDorna Bloom RN 03/14/21 0432 JDW Performed at [facility] [address], [place], [place] [address]   Aerobic/Anaerobic Culture (surgical/deep wound)     Status: None (Preliminary result)   Collection Time: 03/14/21  1:23 PM   Specimen: Abscess  Result Value Ref Range Status   Specimen Description ABSCESS  Final   Special Requests NONE  Final   Gram Stain   Final    ABUNDANT WBC PRESENT, PREDOMINANTLY PMN ABUNDANT GRAM POSITIVE COCCI IN CLUSTERS    Culture   Final    ABUNDANT STAPHYLOCOCCUS AUREUS SUSCEPTIBILITIES TO FOLLOW Performed at [facility], [address], [place], [place] [address]    Report Status PENDING  Incomplete    Radiology Reports CT Chest W Contrast  Result Date: 03/13/2021 CLINICAL DATA:  Follow-up right chest wall hematoma and fractures of the right sixth through eighth ribs. Diabetes. HIV. EXAM: CT CHEST WITH CONTRAST TECHNIQUE: Multidetector CT imaging of the chest was performed during intravenous contrast administration. CONTRAST:  168mL OMNIPAQUE IOHEXOL 300 MG/ML  SOLN COMPARISON:  03/09/2021 FINDINGS: Cardiovascular: Heart size remains normal. No pericardial fluid. Coronary artery calcification as seen previously. Aortic atherosclerotic calcification as seen previously. Mediastinum/Nodes: Normal Lungs/Pleura: Scarring/atelectasis at the lung bases left more than right unchanged since the previous study. Small areas of patchy density in the superior segment of the right lower lobe unchanged from the study of 4 days ago. No new or progressive pulmonary finding. No pneumothorax. Upper Abdomen: No upper abdominal injury or acute finding. Musculoskeletal: There is increasing size of the right chest wall collections, primarily centered around the  right sixth and eighth anterolateral ribs. Those ribs show a pattern of irregular destruction that look more like pathologic fractures due to infection rather than traumatic fractures. Air bubbles are now present within the collections. There is mild extrapleural involvement in side of the ribcage, without frank breakthrough into the pleural space. Most of the collections manifest external to the ribcage. No new areas of involvement are seen. IMPRESSION: Increasing size of the right chest wall collections, primarily centered around the right sixth and eighth anterolateral ribs, with development of air bubbles. Those ribs show a pattern of irregular destruction that have progressed and look more like pathologic fractures due to infection rather than traumatic fractures. Air bubbles are now present within the collections and they probably represent abscesses. There is mild extrapleural involvement on the inner side of the ribcage, without frank breakthrough into the  pleural space. The majority of the collections are on the outer side of the ribcage. No new areas of involvement are seen. Case discussed with Dr. Regenia Skeeter at 1419 hours. Aortic Atherosclerosis (ICD10-I70.0). Electronically Signed   By: Nelson Chimes M.D.   On: 03/13/2021 14:19   CT Chest W Contrast  Result Date: 03/09/2021 CLINICAL DATA:  Fall 3 weeks prior with right rib fractures and persistent chest pain. EXAM: CT CHEST WITH CONTRAST TECHNIQUE: Multidetector CT imaging of the chest was performed during intravenous contrast administration. CONTRAST:  34mL OMNIPAQUE IOHEXOL 300 MG/ML  SOLN COMPARISON:  Chest radiograph from earlier today. 11/22/2019 chest CT angiogram. FINDINGS: Cardiovascular: Normal heart size. No significant pericardial effusion/thickening. Three-vessel coronary atherosclerosis. Atherosclerotic nonaneurysmal thoracic aorta. Top-normal caliber main pulmonary artery (3.0 cm diameter). No central pulmonary emboli. Mediastinum/Nodes:  No discrete thyroid nodules. Unremarkable esophagus. No pathologically enlarged axillary, mediastinal or hilar lymph nodes. Lungs/Pleura: No pneumothorax. No pleural effusion. No acute consolidative airspace disease, lung masses or significant pulmonary nodules. Mild platelike atelectasis in the anterior basilar left lower lobe. Mild patchy ground-glass opacity in peripheral posterior right mid lung. Upper abdomen: No acute abnormality. Musculoskeletal: No aggressive appearing focal osseous lesions. Nondisplaced acute anterior right sixth, seventh and eighth rib fractures with surrounding chest wall hematoma measuring up to 8.1 x 4.9 cm in maximum axial dimensions at the level of the anterior right sixth rib fracture (series 2/image 114). Moderate thoracic spondylosis. IMPRESSION: 1. Nondisplaced acute anterior right sixth, seventh and eighth rib fractures with surrounding chest wall hematoma. No pneumothorax or hemothorax. No discrete bone lesions. Clinical follow-up advised to ensure resolution of the right chest wall hematoma. Any need for follow-up imaging should be based on clinical assessment. 2. Mild patchy ground-glass opacity in the peripheral posterior right mid lung, nonspecific, favor mild pulmonary contusion. 3. Mild platelike atelectasis at the left lung base. 4. Three-vessel coronary atherosclerosis. 5. Aortic Atherosclerosis (ICD10-I70.0). Electronically Signed   By: Ilona Sorrel M.D.   On: 03/09/2021 13:03   IR US Guide Bx Asp/Drain  Result Date: 03/14/2021 INDICATION: 70 year old with history of fall and right rib fractures. Patient developed right chest hematomas. Patient is complaining of pain and recent CT demonstrates gas within the hematomas. Findings are concerning for infected hematomas. EXAM: PLACEMENT OF SUPERFICIAL RIGHT ANTERIOR CHEST WALL DRAIN USING ULTRASOUND GUIDANCE x 2 MEDICATIONS: Moderate sedation ANESTHESIA/SEDATION: Fentanyl 100 mcg IV; Versed 2.0 mg IV Moderate Sedation  Time:  29 minutes The patient was continuously monitored during the procedure by the interventional radiology nurse under my direct supervision. COMPLICATIONS: None immediate. PROCEDURE: Informed written consent was obtained from the patient after a thorough discussion of the procedural risks, benefits and alternatives. All questions were addressed. Maximal Sterile Barrier Technique was utilized including caps, mask, sterile gowns, sterile gloves, sterile drape, hand hygiene and skin antiseptic. A timeout was performed prior to the initiation of the procedure. The right anterior chest wall hematomas were evaluated with ultrasound. Complex collections containing a small amount of fluid were obtained. The right anterior chest was prepped with chlorhexidine and sterile field was created. Attention was initially directed to the more inferior or caudal anterior chest wall collection. Skin was anesthetized with 1% lidocaine. Using ultrasound guidance, an 18 gauge trocar needle was directed into the collection and pink purulent fluid was aspirated. Superstiff Amplatz wire was advanced into the complex collection and the tract was dilated to accommodate a 10 [place] drain. Catheter was sutured to the skin and attached to a suction bulb. A  sample of fluid was sent for culture. A second area more cephalad was targeted with ultrasound guidance. Skin was anesthetized with 1% lidocaine. Using ultrasound guidance, an 18 gauge trocar needle was directed into the complex collection. Again, purulent fluid was aspirated. Superstiff Amplatz wire was advanced into this collection and a 10 French drain was placed. Additional purulent fluid was aspirated and the catheter was sutured to skin and attached to a suction bulb. Dressings were placed over both drains. FINDINGS: Patient has palpable hematomas in the right anterior chest and upper abdomen region. Ultrasound demonstrates heterogeneous collections in both areas containing a small  amount of compressible fluid. There are echogenic areas within the collections compatible with known gas. 10 French drains were placed in both areas and purulent fluid was draining from both collections. Both collections are very complex and compatible with infected hematomas. IMPRESSION: Ultrasound-guided placement of 2 percutaneous drains within the superficial right anterior chest wall collections. Findings are compatible with infected hematomas. Fluid sample was sent for culture. Electronically Signed   By: Markus Daft M.D.   On: 03/14/2021 18:37   IR US Guide Bx Asp/Drain  Result Date: 03/14/2021 INDICATION: 70 year old with history of fall and right rib fractures. Patient developed right chest hematomas. Patient is complaining of pain and recent CT demonstrates gas within the hematomas. Findings are concerning for infected hematomas. EXAM: PLACEMENT OF SUPERFICIAL RIGHT ANTERIOR CHEST WALL DRAIN USING ULTRASOUND GUIDANCE x 2 MEDICATIONS: Moderate sedation ANESTHESIA/SEDATION: Fentanyl 100 mcg IV; Versed 2.0 mg IV Moderate Sedation Time:  29 minutes The patient was continuously monitored during the procedure by the interventional radiology nurse under my direct supervision. COMPLICATIONS: None immediate. PROCEDURE: Informed written consent was obtained from the patient after a thorough discussion of the procedural risks, benefits and alternatives. All questions were addressed. Maximal Sterile Barrier Technique was utilized including caps, mask, sterile gowns, sterile gloves, sterile drape, hand hygiene and skin antiseptic. A timeout was performed prior to the initiation of the procedure. The right anterior chest wall hematomas were evaluated with ultrasound. Complex collections containing a small amount of fluid were obtained. The right anterior chest was prepped with chlorhexidine and sterile field was created. Attention was initially directed to the more inferior or caudal anterior chest wall collection.  Skin was anesthetized with 1% lidocaine. Using ultrasound guidance, an 18 gauge trocar needle was directed into the collection and pink purulent fluid was aspirated. Superstiff Amplatz wire was advanced into the complex collection and the tract was dilated to accommodate a 10 [place] drain. Catheter was sutured to the skin and attached to a suction bulb. A sample of fluid was sent for culture. A second area more cephalad was targeted with ultrasound guidance. Skin was anesthetized with 1% lidocaine. Using ultrasound guidance, an 18 gauge trocar needle was directed into the complex collection. Again, purulent fluid was aspirated. Superstiff Amplatz wire was advanced into this collection and a 10 French drain was placed. Additional purulent fluid was aspirated and the catheter was sutured to skin and attached to a suction bulb. Dressings were placed over both drains. FINDINGS: Patient has palpable hematomas in the right anterior chest and upper abdomen region. Ultrasound demonstrates heterogeneous collections in both areas containing a small amount of compressible fluid. There are echogenic areas within the collections compatible with known gas. 10 French drains were placed in both areas and purulent fluid was draining from both collections. Both collections are very complex and compatible with infected hematomas. IMPRESSION: Ultrasound-guided placement of 2 percutaneous drains within  the superficial right anterior chest wall collections. Findings are compatible with infected hematomas. Fluid sample was sent for culture. Electronically Signed   By: Markus Daft M.D.   On: 03/14/2021 18:37   DG Chest Port 1 View  Result Date: 03/13/2021 CLINICAL DATA:  Sepsis, right rib pain. EXAM: PORTABLE CHEST 1 VIEW COMPARISON:  March 09, 2021. FINDINGS: The heart size and mediastinal contours are within normal limits. Both lungs are clear. No pneumothorax or pleural effusion is noted. The visualized skeletal structures are  unremarkable. IMPRESSION: No active disease. Electronically Signed   By: Marijo Conception M.D.   On: 03/13/2021 14:11   DG Chest Portable 1 View  Result Date: 03/09/2021 CLINICAL DATA:  Chest pain. Chest Arctic hurting late yesterday. Complains of right-sided sternal pain and chest pain. EXAM: PORTABLE CHEST 1 VIEW COMPARISON:  None. FINDINGS: The heart size and mediastinal contours are within normal limits. Both lungs are clear. Blunting of the left costophrenic angle is noted, new from prior studies. The visualized skeletal structures are unremarkable. IMPRESSION: New blunting of left costophrenic angle may reflect small effusion. The lungs are otherwise clear. No signs of interstitial edema. Electronically Signed   By: Kerby Moors M.D.   On: 03/09/2021 11:26                                                                      PROGRESS NOTE                                                                                                                                                                                                             Patient Demographics:    Brian Malone, is a 70 y.o. male, DOB - 01/19/1951, IWL:[id number]  Outpatient Primary MD for the patient is [name], [name], MD    LOS - 3  Admit date - 03/13/2021    Chief Complaint  Patient presents with  . Rib Injury       Brief Narrative (HPI from H&P)  70 year old male with past medical history of hepatitis C, hypertension, hyperlipidemia, diabetes mellitus type 2, chronic pain syndrome, intravenous drug use (abstinent since 2019), major depressive disorder and HIV who had a trip and fall in his yard on 02/22/2021 after which he hurt his right-sided chest  wall, at that time he was diagnosed with right-sided rib cage fracture and injury, he was seen in the ER a few times since then and was treated conservatively, his pain and discomfort continued to get worse and he presented again to [facility] on  03/13/2021 CT  scan suggested that he had right chest wall abscess formation and cellulitis with concerns for sepsis.   Subjective:   Patient in bed appears to be in no distress, denies any headache, some chest wall discomfort at the site of JP drains, no shortness of breath.   Assessment  & Plan :     1. Sepsis due to right-sided chest wall abscess formation after mechanical fall and multiple right rib fractures sustained on 02/22/2021 - he being treated with empiric IV antibiotics along with IV fluids, currently sepsis pathophysiology has improved, Dr. Kipp Brood cardiothoracic surgery was consulted in the ER who suggested ultrasound-guided drainage of the abscess by IR, this was done by IR on 03/14/2021, infected hematoma/abscess which was drained, 2 JP drains in place on 03/15/2021, continue IV antibiotics for now, likely growing staph aureus-follow final cultures.  Avoid overuse of narcotics, letting tramadol without any issues, he informs me that he was told at [facility] 1 year ago that he is allergic to tramadol, reviewed office notes from November 2021 no mention of tramadol as an allergy in his allergy list from the note on 10/24/2020 at [facility].  Encouraged the patient to sit up in chair in the daytime use I-S and flutter valve for pulmonary toiletry.  Will advance activity and titrate down oxygen as possible.  SpO2: 98 % O2 Flow Rate (L/min): 2 L/min   2.  Chronic pain and narcotic use.  Supportive care as needed for acute discomfort, avoid overuse, as needed Narcan added.  Patient counseled.  3.  Continue home regimen follows with [facility] ID department.  4.  Anxiety and depression.  Home medications continued.  Currently not homicidal or suicidal.  5.  GERD.  On PPI.  6.  BPH.  On Flomax.  7.  AKI.  Improved after IV fluids likely due to ATN from sepsis.  8.  Hypertension.  Blood pressure soft, skipped today's blood pressure medications will monitor closely.   9.  DM type II.   On sliding scale monitor and adjust  Lab Results  Component Value Date   HGBA1C 8.3 (H) 03/14/2021   CBG (last 3)  Recent Labs    03/15/21 1737 03/15/21 2104 03/16/21 0734  GLUCAP 144* 167* 138*          Condition - Extremely Guarded  Family Communication  : None present bedside  Code Status :  Full  Consults  :  IR  PUD Prophylaxis : PPI   Procedures  :     US guided chest wall abscess drined by IR 03/14/21   CT -  Increasing size of the right chest wall collections, primarily centered around the right sixth and eighth anterolateral ribs, with development of air bubbles. Those ribs show a pattern of irregular destruction that have progressed and look more like pathologic fractures due to infection rather than traumatic fractures. Air bubbles are now present within the collections and they probably represent abscesses. There is mild extrapleural involvement on the inner side of the ribcage, without frank breakthrough into the pleural space. The majority of the collections are on the outer side of the ribcage. No new areas of involvement are seen.  Disposition Plan  :    Status is: Inpatient  Remains inpatient appropriate because:IV treatments appropriate due to intensity of illness or inability to take PO   Dispo: The patient is from: Home              Anticipated d/c is to: Home              Patient currently is not medically stable to d/c.   Difficult to place patient No  DVT Prophylaxis  :   Heparin   Lab Results  Component Value Date   PLT 219 03/16/2021    Diet :  Diet Order            DIET SOFT Room service appropriate? Yes with Assist; Fluid consistency: Thin  Diet effective now                  Inpatient Medications  Scheduled Meds: . abacavir-dolutegravir-lamiVUDine  1 tablet Oral Daily  . busPIRone  30 mg Oral BID  . Chlorhexidine Gluconate Cloth  6 each Topical Q0600  . clonazepam  0.25 mg Oral BID  . ezetimibe  10 mg Oral Daily   . feeding supplement (GLUCERNA SHAKE)  237 mL Oral TID BM  . heparin injection (subcutaneous)  5,000 Units Subcutaneous Q8H  . insulin aspart  0-15 Units Subcutaneous TID AC & HS  . isosorbide mononitrate  30 mg Oral Daily  . multivitamin with minerals  1 tablet Oral Daily  . mupirocin ointment  1 application Nasal BID  . pantoprazole  40 mg Oral QAC breakfast  . potassium chloride  40 mEq Oral BID  . tamsulosin  0.4 mg Oral QHS  . tiZANidine  2 mg Oral QHS   Continuous Infusions: . sodium chloride Stopped (03/13/21 2106)  . lactated ringers 50 mL/hr at 03/15/21 1212  . vancomycin Stopped (03/15/21 1416)   PRN Meds:.sodium chloride, acetaminophen **OR** [DISCONTINUED] acetaminophen, HYDROmorphone (DILAUDID) injection **OR** [DISCONTINUED]  HYDROmorphone (DILAUDID) injection, naLOXone (NARCAN)  injection, [DISCONTINUED] ondansetron **OR** ondansetron (ZOFRAN) IV, polyethylene glycol, traMADol, valACYclovir  Antibiotics  :    Anti-infectives (From admission, onward)   Start     Dose/Rate Route Frequency Ordered Stop   03/15/21 1200  vancomycin (VANCOREADY) IVPB 1500 mg/300 mL        1,500 mg 150 mL/hr over 120 Minutes Intravenous Every 24 hours 03/15/21 0732     03/14/21 1200  vancomycin (VANCOREADY) IVPB 1250 mg/250 mL  Status:  Discontinued        1,250 mg 166.7 mL/hr over 90 Minutes Intravenous Every 24 hours 03/13/21 1359 03/15/21 0732   03/14/21 1023  valACYclovir (VALTREX) tablet 1,000 mg       Note to Pharmacy: Trig neuralgia flare     1,000 mg Oral 3 times daily PRN 03/14/21 1023     03/14/21 1000  abacavir-dolutegravir-lamiVUDine (TRIUMEQ) 600-50-300 MG per tablet 1 tablet        1 tablet Oral Daily 03/13/21 2320     03/14/21 0200  meropenem (MERREM) 1 g in sodium chloride 0.9 % 100 mL IVPB  Status:  Discontinued        1 g 200 mL/hr over 30 Minutes Intravenous Every 8 hours 03/13/21 2337 03/16/21 0719   03/14/21 0100  ceFEPIme (MAXIPIME) 2 g in sodium chloride 0.9 % 100  mL IVPB  Status:  Discontinued       Note to Pharmacy: Cefepime 2 g IV q12h for CrCl < 60 mL/min   2 g 200  mL/hr over 30 Minutes Intravenous Every 12 hours 03/13/21 2326 03/13/21 2331   03/13/21 2000  piperacillin-tazobactam (ZOSYN) IVPB 3.375 g  Status:  Discontinued        3.375 g 12.5 mL/hr over 240 Minutes Intravenous Every 8 hours 03/13/21 1402 03/13/21 2320   03/13/21 1430  clindamycin (CLEOCIN) IVPB 900 mg        900 mg 100 mL/hr over 30 Minutes Intravenous  Once 03/13/21 1418 03/13/21 1548   03/13/21 1230  vancomycin (VANCOCIN) IVPB 1000 mg/200 mL premix        1,000 mg 200 mL/hr over 60 Minutes Intravenous  Once 03/13/21 1229 03/13/21 1506   03/13/21 1230  piperacillin-tazobactam (ZOSYN) IVPB 3.375 g        3.375 g 100 mL/hr over 30 Minutes Intravenous  Once 03/13/21 1229 03/13/21 1400       Time Spent in minutes  30   Lala Lund M.D on 03/16/2021 at 9:43 AM  To page go to [email or web address]   [facility] -  Office  [phone]    See all Orders from today for further details    Objective:   Vitals:   03/15/21 2029 03/15/21 2200 03/16/21 0356 03/16/21 0357  BP: 139/85   114/69  Pulse: (!) 103  73 71  Resp: 19  17 14   Temp: 100.2 F (37.9 C) 99.5 F (37.5 C)  97.6 F (36.4 C)  TempSrc: Oral   Oral  SpO2: 96%  98% 98%  Weight:   85.2 kg   Height:        Wt Readings from Last 3 Encounters:  03/16/21 85.2 kg  03/09/21 77.1 kg  01/22/21 72.6 kg     Intake/Output Summary (Last 24 hours) at 03/16/2021 0943 Last data filed at 03/16/2021 0734 Gross per 24 hour  Intake 1798.55 ml  Output 1209 ml  Net 589.55 ml   Physical Exam  Awake Alert, No new F.N deficits, in no distress [place].AT,PERRAL Supple Neck,No JVD, No cervical lymphadenopathy appriciated.  Symmetrical Chest wall movement, Good air movement bilaterally, CTAB RRR,No Gallops, Rubs or new Murmurs, No Parasternal Heave +ve B.Sounds, Abd Soft, No tenderness, No organomegaly appriciated, No  rebound - guarding or rigidity. No Cyanosis, Clubbing or edema, No new Rash or bruise   L chest wall below on 03/14/20( now 2 JP drains)         Data Review:    CBC Recent Labs  Lab 03/09/21 1110 03/13/21 1320 03/14/21 0038 03/15/21 0220 03/16/21 0226  WBC 13.1* 18.1* 15.9* 12.2* 11.7*  HGB 11.7* 10.5* 10.0* 9.7* 10.2*  HCT 35.2* 31.8* 29.8* 29.8* 30.9*  PLT 231 262 219 209 219  MCV 98.3 99.1 98.0 98.3 98.4  MCH 32.7 32.7 32.9 32.0 32.5  MCHC 33.2 33.0 33.6 32.6 33.0  RDW 12.2 12.9 12.8 13.2 13.0  LYMPHSABS  --  0.5* 0.4* 0.5* 0.8  MONOABS  --  1.0 0.7 0.9 1.2*  EOSABS  --  0.1 0.1 0.1 0.1  BASOSABS  --  0.0 0.0 0.0 0.0    Recent Labs  Lab 03/09/21 1110 03/13/21 1320 03/13/21 1615 03/14/21 0038 03/15/21 0220 03/16/21 0226  NA 130* 131*  --  134* 134* 134*  K 3.7 3.5  --  3.5 3.7 3.2*  CL 93* 95*  --  99 100 102  CO2 28 25  --  28 26 26   GLUCOSE 283* 327*  --  157* 101* 130*  BUN 18 32*  --  24* 17 17  CREATININE 1.03 1.56*  --  1.21 1.24 1.20  CALCIUM 8.4* 7.9*  --  8.1* 8.0* 7.8*  AST 11* 10*  --  10* 9* 12*  ALT 8 8  --  8 7 7   ALKPHOS 95 91  --  82 98 93  BILITOT 0.4 0.7  --  1.1 1.0 0.8  ALBUMIN 2.9* 2.2*  --  1.9* 1.7* 1.6*  MG  --   --   --  1.4* 1.8 1.7  PROCALCITON  --   --   --  0.56 1.00 0.68  LATICACIDVEN  --  1.9 2.4* 1.8  --   --   INR  --  1.3*  --  1.3*  --   --   HGBA1C  --   --   --  8.3*  --   --     ------------------------------------------------------------------------------------------------------------------ No results for input(s): CHOL, HDL, LDLCALC, TRIG, CHOLHDL, LDLDIRECT in the last 72 hours.  Lab Results  Component Value Date   HGBA1C 8.3 (H) 03/14/2021   ------------------------------------------------------------------------------------------------------------------ No results for input(s): TSH, T4TOTAL, T3FREE, THYROIDAB in the last 72 hours.  Invalid input(s): FREET3  Cardiac Enzymes No results for  input(s): CKMB, TROPONINI, MYOGLOBIN in the last 168 hours.  Invalid input(s): CK ------------------------------------------------------------------------------------------------------------------ No results found for: BNP  Micro Results Recent Results (from the past 240 hour(s))  Urine culture     Status: None   Collection Time: 03/13/21 12:25 AM   Specimen: In/Out Cath Urine  Result Value Ref Range Status   Specimen Description IN/OUT CATH URINE  Final   Special Requests NONE  Final   Culture   Final    NO GROWTH Performed at [facility], [address], [place], [place] [address]    Report Status 03/15/2021 FINAL  Final  Blood Culture (routine x 2)     Status: None (Preliminary result)   Collection Time: 03/13/21 12:25 PM   Specimen: Right Antecubital; Blood  Result Value Ref Range Status   Specimen Description   Final    RIGHT ANTECUBITAL BLOOD Performed at [facility], [address] [place], [place] [address]    Special Requests   Final    BOTTLES DRAWN AEROBIC AND ANAEROBIC Blood Culture results may not be optimal due to an inadequate volume of blood received in culture bottles Performed at [facility], [address] [place], [place] [address]    Culture   Final    NO GROWTH 3 DAYS Performed at [facility], [place] [address] [place], [place] [address]    Report Status PENDING  Incomplete  Blood Culture (routine x 2)     Status: None (Preliminary result)   Collection Time: 03/13/21  1:15 PM   Specimen: BLOOD RIGHT HAND  Result Value Ref Range Status   Specimen Description   Final    BLOOD RIGHT HAND BLOOD Performed at [facility] [address] [place], [place] [address]    Special Requests   Final    BOTTLES DRAWN AEROBIC AND ANAEROBIC Blood Culture results may not be optimal due to an inadequate volume of blood received in culture bottles Performed at [facility], [address] [place],  [place] [address]    Culture   Final    NO GROWTH 3 DAYS Performed at [facility], [place] [address] [place], [place] [address]    Report Status PENDING  Incomplete  Resp Panel by RT-PCR (Flu A&B, Covid) Nasopharyngeal Swab     Status: None   Collection Time: 03/13/21  2:01 PM   Specimen: Nasopharyngeal Swab; Nasopharyngeal(NP) swabs in vial transport medium  Result Value Ref Range Status   SARS Coronavirus 2 by RT PCR NEGATIVE NEGATIVE Final    Comment: (NOTE) SARS-CoV-2 target nucleic acids are NOT DETECTED.  The SARS-CoV-2 RNA is generally detectable in upper respiratory specimens during the acute phase of infection. The lowest concentration of SARS-CoV-2 viral copies this assay can detect is 138 copies/mL. A negative result does not preclude SARS-Cov-2 infection and should not be used as the sole basis for treatment or other patient management decisions. A negative result may occur with  improper specimen collection/handling, submission of specimen other than nasopharyngeal swab, presence of viral mutation(s) within the areas targeted by this assay, and inadequate number of viral copies(<138 copies/mL). A negative result must be combined with clinical observations, patient history, and epidemiological information. The expected result is Negative.  Fact Sheet for Patients:  EntrepreneurPulse.com.au  Fact Sheet for Healthcare Providers:  IncredibleEmployment.be  This test is no t yet approved or cleared by the [place] FDA and  has been authorized for detection and/or diagnosis of SARS-CoV-2 by FDA under an Emergency Use Authorization (EUA). This EUA will remain  in effect (meaning this test can be used) for the duration of the COVID-19 declaration under Section 564(b)(1) of the Act, 21 U.S.C.section 360bbb-3(b)(1), unless the authorization is terminated  or revoked sooner.       Influenza A by PCR NEGATIVE NEGATIVE Final   Influenza  B by PCR NEGATIVE NEGATIVE Final    Comment: (NOTE) The Xpert Xpress SARS-CoV-2/FLU/RSV plus assay is intended as an aid in the diagnosis of influenza from Nasopharyngeal swab specimens and should not be used as a sole basis for treatment. Nasal washings and aspirates are unacceptable for Xpert Xpress SARS-CoV-2/FLU/RSV testing.  Fact Sheet for Patients: EntrepreneurPulse.com.au  Fact Sheet for Healthcare Providers: IncredibleEmployment.be  This test is not yet approved or cleared by the [place] FDA and has been authorized for detection and/or diagnosis of SARS-CoV-2 by FDA under an Emergency Use Authorization (EUA). This EUA will remain in effect (meaning this test can be used) for the duration of the COVID-19 declaration under Section 564(b)(1) of the Act, 21 U.S.C. section 360bbb-3(b)(1), unless the authorization is terminated or revoked.  Performed at [facility], [address] [place], [place] [address]   MRSA PCR Screening     Status: Abnormal   Collection Time: 03/13/21 11:51 PM   Specimen: Nasal Mucosa; Nasopharyngeal  Result Value Ref Range Status   MRSA by PCR POSITIVE (A) NEGATIVE Final    Comment:        The GeneXpert MRSA Assay (FDA approved for NASAL specimens only), is one component of a comprehensive MRSA colonization surveillance program. It is not intended to diagnose MRSA infection nor to guide or monitor treatment for MRSA infections. RESULT CALLED TO, READ BACK BY AND VERIFIED WITHDorna Bloom RN 03/14/21 0432 JDW Performed at [facility] [address], [place], [place] [address]   Aerobic/Anaerobic Culture (surgical/deep wound)     Status: None (Preliminary result)   Collection Time: 03/14/21  1:23 PM   Specimen: Abscess  Result Value Ref Range Status   Specimen Description ABSCESS  Final   Special Requests NONE  Final   Gram Stain   Final    ABUNDANT WBC PRESENT, PREDOMINANTLY  PMN ABUNDANT GRAM POSITIVE COCCI IN CLUSTERS    Culture   Final    ABUNDANT STAPHYLOCOCCUS AUREUS SUSCEPTIBILITIES TO FOLLOW Performed at [facility], [place] [address] [place],  [address]    Report Status PENDING  Incomplete    Radiology Reports CT Chest W Contrast  Result Date: 03/13/2021 CLINICAL DATA:  Follow-up right chest wall hematoma and fractures of the right sixth through eighth ribs. Diabetes. HIV. EXAM: CT CHEST WITH CONTRAST TECHNIQUE: Multidetector CT imaging of the chest was performed during intravenous contrast administration. CONTRAST:  172mL OMNIPAQUE IOHEXOL 300 MG/ML  SOLN COMPARISON:  03/09/2021 FINDINGS: Cardiovascular: Heart size remains normal. No pericardial fluid. Coronary artery calcification as seen previously. Aortic atherosclerotic calcification as seen previously. Mediastinum/Nodes: Normal Lungs/Pleura: Scarring/atelectasis at the lung bases left more than right unchanged since the previous study. Small areas of patchy density in the superior segment of the right lower lobe unchanged from the study of 4 days ago. No new or progressive pulmonary finding. No pneumothorax. Upper Abdomen: No upper abdominal injury or acute finding. Musculoskeletal: There is increasing size of the right chest wall collections, primarily centered around the right sixth and eighth anterolateral ribs. Those ribs show a pattern of irregular destruction that look more like pathologic fractures due to infection rather than traumatic fractures. Air bubbles are now present within the collections. There is mild extrapleural involvement in side of the ribcage, without frank breakthrough into the pleural space. Most of the collections manifest external to the ribcage. No new areas of involvement are seen. IMPRESSION: Increasing size of the right chest wall collections, primarily centered around the right sixth and eighth anterolateral ribs, with development of air bubbles. Those ribs show a  pattern of irregular destruction that have progressed and look more like pathologic fractures due to infection rather than traumatic fractures. Air bubbles are now present within the collections and they probably represent abscesses. There is mild extrapleural involvement on the inner side of the ribcage, without frank breakthrough into the pleural space. The majority of the collections are on the outer side of the ribcage. No new areas of involvement are seen. Case discussed with Dr. Regenia Skeeter at 1419 hours. Aortic Atherosclerosis (ICD10-I70.0). Electronically Signed   By: Nelson Chimes M.D.   On: 03/13/2021 14:19   CT Chest W Contrast  Result Date: 03/09/2021 CLINICAL DATA:  Fall 3 weeks prior with right rib fractures and persistent chest pain. EXAM: CT CHEST WITH CONTRAST TECHNIQUE: Multidetector CT imaging of the chest was performed during intravenous contrast administration. CONTRAST:  65mL OMNIPAQUE IOHEXOL 300 MG/ML  SOLN COMPARISON:  Chest radiograph from earlier today. 11/22/2019 chest CT angiogram. FINDINGS: Cardiovascular: Normal heart size. No significant pericardial effusion/thickening. Three-vessel coronary atherosclerosis. Atherosclerotic nonaneurysmal thoracic aorta. Top-normal caliber main pulmonary artery (3.0 cm diameter). No central pulmonary emboli. Mediastinum/Nodes: No discrete thyroid nodules. Unremarkable esophagus. No pathologically enlarged axillary, mediastinal or hilar lymph nodes. Lungs/Pleura: No pneumothorax. No pleural effusion. No acute consolidative airspace disease, lung masses or significant pulmonary nodules. Mild platelike atelectasis in the anterior basilar left lower lobe. Mild patchy ground-glass opacity in peripheral posterior right mid lung. Upper abdomen: No acute abnormality. Musculoskeletal: No aggressive appearing focal osseous lesions. Nondisplaced acute anterior right sixth, seventh and eighth rib fractures with surrounding chest wall hematoma measuring up to 8.1 x  4.9 cm in maximum axial dimensions at the level of the anterior right sixth rib fracture (series 2/image 114). Moderate thoracic spondylosis. IMPRESSION: 1. Nondisplaced acute anterior right sixth, seventh and  eighth rib fractures with surrounding chest wall hematoma. No pneumothorax or hemothorax. No discrete bone lesions. Clinical follow-up advised to ensure resolution of the right chest wall hematoma. Any need for follow-up imaging should be based on clinical assessment. 2. Mild patchy ground-glass opacity in the peripheral posterior right mid lung, nonspecific, favor mild pulmonary contusion. 3. Mild platelike atelectasis at the left lung base. 4. Three-vessel coronary atherosclerosis. 5. Aortic Atherosclerosis (ICD10-I70.0). Electronically Signed   By: Ilona Sorrel M.D.   On: 03/09/2021 13:03   IR US Guide Bx Asp/Drain  Result Date: 03/14/2021 INDICATION: 70 year old with history of fall and right rib fractures. Patient developed right chest hematomas. Patient is complaining of pain and recent CT demonstrates gas within the hematomas. Findings are concerning for infected hematomas. EXAM: PLACEMENT OF SUPERFICIAL RIGHT ANTERIOR CHEST WALL DRAIN USING ULTRASOUND GUIDANCE x 2 MEDICATIONS: Moderate sedation ANESTHESIA/SEDATION: Fentanyl 100 mcg IV; Versed 2.0 mg IV Moderate Sedation Time:  29 minutes The patient was continuously monitored during the procedure by the interventional radiology nurse under my direct supervision. COMPLICATIONS: None immediate. PROCEDURE: Informed written consent was obtained from the patient after a thorough discussion of the procedural risks, benefits and alternatives. All questions were addressed. Maximal Sterile Barrier Technique was utilized including caps, mask, sterile gowns, sterile gloves, sterile drape, hand hygiene and skin antiseptic. A timeout was performed prior to the initiation of the procedure. The right anterior chest wall hematomas were evaluated with ultrasound.  Complex collections containing a small amount of fluid were obtained. The right anterior chest was prepped with chlorhexidine and sterile field was created. Attention was initially directed to the more inferior or caudal anterior chest wall collection. Skin was anesthetized with 1% lidocaine. Using ultrasound guidance, an 18 gauge trocar needle was directed into the collection and pink purulent fluid was aspirated. Superstiff Amplatz wire was advanced into the complex collection and the tract was dilated to accommodate a 10 [place] drain. Catheter was sutured to the skin and attached to a suction bulb. A sample of fluid was sent for culture. A second area more cephalad was targeted with ultrasound guidance. Skin was anesthetized with 1% lidocaine. Using ultrasound guidance, an 18 gauge trocar needle was directed into the complex collection. Again, purulent fluid was aspirated. Superstiff Amplatz wire was advanced into this collection and a 10 French drain was placed. Additional purulent fluid was aspirated and the catheter was sutured to skin and attached to a suction bulb. Dressings were placed over both drains. FINDINGS: Patient has palpable hematomas in the right anterior chest and upper abdomen region. Ultrasound demonstrates heterogeneous collections in both areas containing a small amount of compressible fluid. There are echogenic areas within the collections compatible with known gas. 10 French drains were placed in both areas and purulent fluid was draining from both collections. Both collections are very complex and compatible with infected hematomas. IMPRESSION: Ultrasound-guided placement of 2 percutaneous drains within the superficial right anterior chest wall collections. Findings are compatible with infected hematomas. Fluid sample was sent for culture. Electronically Signed   By: Markus Daft M.D.   On: 03/14/2021 18:37   IR US Guide Bx Asp/Drain  Result Date: 03/14/2021 INDICATION: 70 year old with  history of fall and right rib fractures. Patient developed right chest hematomas. Patient is complaining of pain and recent CT demonstrates gas within the hematomas. Findings are concerning for infected hematomas. EXAM: PLACEMENT OF SUPERFICIAL RIGHT ANTERIOR CHEST WALL DRAIN USING ULTRASOUND GUIDANCE x 2 MEDICATIONS: Moderate sedation ANESTHESIA/SEDATION: Fentanyl 100 mcg IV;  Versed 2.0 mg IV Moderate Sedation Time:  29 minutes The patient was continuously monitored during the procedure by the interventional radiology nurse under my direct supervision. COMPLICATIONS: None immediate. PROCEDURE: Informed written consent was obtained from the patient after a thorough discussion of the procedural risks, benefits and alternatives. All questions were addressed. Maximal Sterile Barrier Technique was utilized including caps, mask, sterile gowns, sterile gloves, sterile drape, hand hygiene and skin antiseptic. A timeout was performed prior to the initiation of the procedure. The right anterior chest wall hematomas were evaluated with ultrasound. Complex collections containing a small amount of fluid were obtained. The right anterior chest was prepped with chlorhexidine and sterile field was created. Attention was initially directed to the more inferior or caudal anterior chest wall collection. Skin was anesthetized with 1% lidocaine. Using ultrasound guidance, an 18 gauge trocar needle was directed into the collection and pink purulent fluid was aspirated. Superstiff Amplatz wire was advanced into the complex collection and the tract was dilated to accommodate a 10 [place] drain. Catheter was sutured to the skin and attached to a suction bulb. A sample of fluid was sent for culture. A second area more cephalad was targeted with ultrasound guidance. Skin was anesthetized with 1% lidocaine. Using ultrasound guidance, an 18 gauge trocar needle was directed into the complex collection. Again, purulent fluid was aspirated.  Superstiff Amplatz wire was advanced into this collection and a 10 French drain was placed. Additional purulent fluid was aspirated and the catheter was sutured to skin and attached to a suction bulb. Dressings were placed over both drains. FINDINGS: Patient has palpable hematomas in the right anterior chest and upper abdomen region. Ultrasound demonstrates heterogeneous collections in both areas containing a small amount of compressible fluid. There are echogenic areas within the collections compatible with known gas. 10 French drains were placed in both areas and purulent fluid was draining from both collections. Both collections are very complex and compatible with infected hematomas. IMPRESSION: Ultrasound-guided placement of 2 percutaneous drains within the superficial right anterior chest wall collections. Findings are compatible with infected hematomas. Fluid sample was sent for culture. Electronically Signed   By: Markus Daft M.D.   On: 03/14/2021 18:37   DG Chest Port 1 View  Result Date: 03/13/2021 CLINICAL DATA:  Sepsis, right rib pain. EXAM: PORTABLE CHEST 1 VIEW COMPARISON:  March 09, 2021. FINDINGS: The heart size and mediastinal contours are within normal limits. Both lungs are clear. No pneumothorax or pleural effusion is noted. The visualized skeletal structures are unremarkable. IMPRESSION: No active disease. Electronically Signed   By: Marijo Conception M.D.   On: 03/13/2021 14:11   DG Chest Portable 1 View  Result Date: 03/09/2021 CLINICAL DATA:  Chest pain. Chest Arctic hurting late yesterday. Complains of right-sided sternal pain and chest pain. EXAM: PORTABLE CHEST 1 VIEW COMPARISON:  None. FINDINGS: The heart size and mediastinal contours are within normal limits. Both lungs are clear. Blunting of the left costophrenic angle is noted, new from prior studies. The visualized skeletal structures are unremarkable. IMPRESSION: New blunting of left costophrenic angle may reflect small  effusion. The lungs are otherwise clear. No signs of interstitial edema. Electronically Signed   By: Kerby Moors M.D.   On: 03/09/2021 11:26
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
## 2021-03-16 NOTE — Progress Notes (Signed)
Referring Physician(s): Dr Ronnie Derby  Supervising Physician: Mir, Sharen Heck  Patient Status:  [facility] - In-pt  Chief Complaint:  Infected hematomas- right anterior chest Procedure: US guided drain placement X 2 - 03/14/21  Subjective:  OP of #2 good~40 cc daily OP #1- minimal- JP wont stay charged Both are putting out bloody pus material  Pt is in pain Uncomfortable   Allergies: Adhesive [tape], Cyclobenzaprine, Duloxetine hcl, and Morphine  Medications: Prior to Admission medications   Medication Sig Start Date End Date Taking? Authorizing Provider  AIMOVIG 140 MG/ML SOAJ Inject 140 mg into the skin every 30 (thirty) days.  09/24/20  Yes [provider]  BELBUCA 150 MCG FILM Place 1 Film inside cheek every 12 (twelve) hours. 03/06/21  Yes [provider]  busPIRone (BUSPAR) 15 MG tablet Take 1 tablet (15 mg total) by mouth 3 (three) times daily. Patient taking differently: Take 30 mg by mouth 2 (two) times daily. 09/03/20  Yes Connye Burkitt, NP  busPIRone (BUSPAR) 30 MG tablet Take 30 mg by mouth 2 (two) times daily. 03/04/21  Yes [provider]  clonazePAM (KLONOPIN) 0.5 MG tablet Take 0.5 mg by mouth 3 (three) times daily. 10/01/19  Yes [provider]  ezetimibe (ZETIA) 10 MG tablet Take 10 mg by mouth daily.   Yes [provider]  isosorbide mononitrate (IMDUR) 60 MG 24 hr tablet TAKE 1 TABLET(60 MG) BY MOUTH DAILY Patient taking differently: Take 60 mg by mouth daily. 12/09/20  Yes Elouise Munroe, MD  loratadine (CLARITIN) 10 MG tablet Take 10 mg by mouth daily.    Yes [provider]  MAVYRET 100-40 MG TABS Take 3 tablets by mouth daily. 03/04/21  Yes [provider]  pantoprazole (PROTONIX) 40 MG tablet Take 40 mg by mouth daily before breakfast.  02/12/19  Yes [provider]  propranolol ER (INDERAL LA) 60 MG 24 hr capsule Take 60 mg by mouth daily.  08/28/20  Yes [provider]   Semaglutide,0.25 or 0.5MG /DOS, 2 MG/1.5ML SOPN Inject 0.5 mg into the skin every Friday.  10/08/19  Yes [provider]  tamsulosin (FLOMAX) 0.4 MG CAPS capsule Take 0.4 mg by mouth at bedtime.  02/12/19  Yes [provider]  tiZANidine (ZANAFLEX) 4 MG tablet Take 4 mg by mouth at bedtime.  03/08/19  Yes [provider]  TRIUMEQ 600-50-300 MG tablet Take 1 tablet by mouth daily.  02/12/19  Yes [provider]  valACYclovir (VALTREX) 1000 MG tablet Take 1,000 mg by mouth 3 (three) times daily as needed (as directed for trigeminal neuralgia flares). Trig neuralgia flare 10/16/19  Yes [provider]     Vital Signs: BP 114/69 (BP Location: Left Arm)   Pulse 71   Temp 97.6 F (36.4 C) (Oral)   Resp 14   Ht 6' (1.829 m)   Wt 187 lb 13.3 oz (85.2 kg)   SpO2 98%   BMI 25.47 kg/m   Physical Exam Vitals reviewed.  Skin:    General: Skin is warm.     Findings: Erythema present.     Comments: Sites of drain are clean and dry Large are of skin where drains are- is red; tender Weeping with some pus from skin area  Drains are intact Tender to touch #1 and #2 both flush well; but both do pull back some air when aspirate  Dressing replaced  Neurological:     Mental Status: He is oriented to person, place,  and time.     Imaging: CT Chest W Contrast  Result Date: 03/13/2021 CLINICAL DATA:  Follow-up right chest wall hematoma and fractures of the right sixth through eighth ribs. Diabetes. HIV. EXAM: CT CHEST WITH CONTRAST TECHNIQUE: Multidetector CT imaging of the chest was performed during intravenous contrast administration. CONTRAST:  12mL OMNIPAQUE IOHEXOL 300 MG/ML  SOLN COMPARISON:  03/09/2021 FINDINGS: Cardiovascular: Heart size remains normal. No pericardial fluid. Coronary artery calcification as seen previously. Aortic atherosclerotic calcification as seen previously. Mediastinum/Nodes: Normal Lungs/Pleura: Scarring/atelectasis at the  lung bases left more than right unchanged since the previous study. Small areas of patchy density in the superior segment of the right lower lobe unchanged from the study of 4 days ago. No new or progressive pulmonary finding. No pneumothorax. Upper Abdomen: No upper abdominal injury or acute finding. Musculoskeletal: There is increasing size of the right chest wall collections, primarily centered around the right sixth and eighth anterolateral ribs. Those ribs show a pattern of irregular destruction that look more like pathologic fractures due to infection rather than traumatic fractures. Air bubbles are now present within the collections. There is mild extrapleural involvement in side of the ribcage, without frank breakthrough into the pleural space. Most of the collections manifest external to the ribcage. No new areas of involvement are seen. IMPRESSION: Increasing size of the right chest wall collections, primarily centered around the right sixth and eighth anterolateral ribs, with development of air bubbles. Those ribs show a pattern of irregular destruction that have progressed and look more like pathologic fractures due to infection rather than traumatic fractures. Air bubbles are now present within the collections and they probably represent abscesses. There is mild extrapleural involvement on the inner side of the ribcage, without frank breakthrough into the pleural space. The majority of the collections are on the outer side of the ribcage. No new areas of involvement are seen. Case discussed with Dr. Regenia Skeeter at 1419 hours. Aortic Atherosclerosis (ICD10-I70.0). Electronically Signed   By: Nelson Chimes M.D.   On: 03/13/2021 14:19   IR US Guide Bx Asp/Drain  Result Date: 03/14/2021 INDICATION: 70 year old with history of fall and right rib fractures. Patient developed right chest hematomas. Patient is complaining of pain and recent CT demonstrates gas within the hematomas. Findings are concerning for  infected hematomas. EXAM: PLACEMENT OF SUPERFICIAL RIGHT ANTERIOR CHEST WALL DRAIN USING ULTRASOUND GUIDANCE x 2 MEDICATIONS: Moderate sedation ANESTHESIA/SEDATION: Fentanyl 100 mcg IV; Versed 2.0 mg IV Moderate Sedation Time:  29 minutes The patient was continuously monitored during the procedure by the interventional radiology nurse under my direct supervision. COMPLICATIONS: None immediate. PROCEDURE: Informed written consent was obtained from the patient after a thorough discussion of the procedural risks, benefits and alternatives. All questions were addressed. Maximal Sterile Barrier Technique was utilized including caps, mask, sterile gowns, sterile gloves, sterile drape, hand hygiene and skin antiseptic. A timeout was performed prior to the initiation of the procedure. The right anterior chest wall hematomas were evaluated with ultrasound. Complex collections containing a small amount of fluid were obtained. The right anterior chest was prepped with chlorhexidine and sterile field was created. Attention was initially directed to the more inferior or caudal anterior chest wall collection. Skin was anesthetized with 1% lidocaine. Using ultrasound guidance, an 18 gauge trocar needle was directed into the collection and pink purulent fluid was aspirated. Superstiff Amplatz wire was advanced into the complex collection and the tract was dilated to accommodate a 10 [place] drain. Catheter was sutured to the  skin and attached to a suction bulb. A sample of fluid was sent for culture. A second area more cephalad was targeted with ultrasound guidance. Skin was anesthetized with 1% lidocaine. Using ultrasound guidance, an 18 gauge trocar needle was directed into the complex collection. Again, purulent fluid was aspirated. Superstiff Amplatz wire was advanced into this collection and a 10 French drain was placed. Additional purulent fluid was aspirated and the catheter was sutured to skin and attached to a suction bulb.  Dressings were placed over both drains. FINDINGS: Patient has palpable hematomas in the right anterior chest and upper abdomen region. Ultrasound demonstrates heterogeneous collections in both areas containing a small amount of compressible fluid. There are echogenic areas within the collections compatible with known gas. 10 French drains were placed in both areas and purulent fluid was draining from both collections. Both collections are very complex and compatible with infected hematomas. IMPRESSION: Ultrasound-guided placement of 2 percutaneous drains within the superficial right anterior chest wall collections. Findings are compatible with infected hematomas. Fluid sample was sent for culture. Electronically Signed   By: Markus Daft M.D.   On: 03/14/2021 18:37   IR US Guide Bx Asp/Drain  Result Date: 03/14/2021 INDICATION: 70 year old with history of fall and right rib fractures. Patient developed right chest hematomas. Patient is complaining of pain and recent CT demonstrates gas within the hematomas. Findings are concerning for infected hematomas. EXAM: PLACEMENT OF SUPERFICIAL RIGHT ANTERIOR CHEST WALL DRAIN USING ULTRASOUND GUIDANCE x 2 MEDICATIONS: Moderate sedation ANESTHESIA/SEDATION: Fentanyl 100 mcg IV; Versed 2.0 mg IV Moderate Sedation Time:  29 minutes The patient was continuously monitored during the procedure by the interventional radiology nurse under my direct supervision. COMPLICATIONS: None immediate. PROCEDURE: Informed written consent was obtained from the patient after a thorough discussion of the procedural risks, benefits and alternatives. All questions were addressed. Maximal Sterile Barrier Technique was utilized including caps, mask, sterile gowns, sterile gloves, sterile drape, hand hygiene and skin antiseptic. A timeout was performed prior to the initiation of the procedure. The right anterior chest wall hematomas were evaluated with ultrasound. Complex collections containing a  small amount of fluid were obtained. The right anterior chest was prepped with chlorhexidine and sterile field was created. Attention was initially directed to the more inferior or caudal anterior chest wall collection. Skin was anesthetized with 1% lidocaine. Using ultrasound guidance, an 18 gauge trocar needle was directed into the collection and pink purulent fluid was aspirated. Superstiff Amplatz wire was advanced into the complex collection and the tract was dilated to accommodate a 10 [place] drain. Catheter was sutured to the skin and attached to a suction bulb. A sample of fluid was sent for culture. A second area more cephalad was targeted with ultrasound guidance. Skin was anesthetized with 1% lidocaine. Using ultrasound guidance, an 18 gauge trocar needle was directed into the complex collection. Again, purulent fluid was aspirated. Superstiff Amplatz wire was advanced into this collection and a 10 French drain was placed. Additional purulent fluid was aspirated and the catheter was sutured to skin and attached to a suction bulb. Dressings were placed over both drains. FINDINGS: Patient has palpable hematomas in the right anterior chest and upper abdomen region. Ultrasound demonstrates heterogeneous collections in both areas containing a small amount of compressible fluid. There are echogenic areas within the collections compatible with known gas. 10 French drains were placed in both areas and purulent fluid was draining from both collections. Both collections are very complex and compatible with infected hematomas.  IMPRESSION: Ultrasound-guided placement of 2 percutaneous drains within the superficial right anterior chest wall collections. Findings are compatible with infected hematomas. Fluid sample was sent for culture. Electronically Signed   By: Markus Daft M.D.   On: 03/14/2021 18:37   DG Chest Port 1 View  Result Date: 03/13/2021 CLINICAL DATA:  Sepsis, right rib pain. EXAM: PORTABLE CHEST 1  VIEW COMPARISON:  March 09, 2021. FINDINGS: The heart size and mediastinal contours are within normal limits. Both lungs are clear. No pneumothorax or pleural effusion is noted. The visualized skeletal structures are unremarkable. IMPRESSION: No active disease. Electronically Signed   By: Marijo Conception M.D.   On: 03/13/2021 14:11    Labs:  CBC: Recent Labs    03/13/21 1320 03/14/21 0038 03/15/21 0220 03/16/21 0226  WBC 18.1* 15.9* 12.2* 11.7*  HGB 10.5* 10.0* 9.7* 10.2*  HCT 31.8* 29.8* 29.8* 30.9*  PLT 262 219 209 219    COAGS: Recent Labs    11/03/20 1355 03/13/21 1320 03/14/21 0038  INR 1.0 1.3* 1.3*  APTT  --  37*  --     BMP: Recent Labs    06/02/20 2257 06/18/20 1918 09/01/20 2000 10/22/20 1629 03/13/21 1320 03/14/21 0038 03/15/21 0220 03/16/21 0226  NA 144 139 139   < > 131* 134* 134* 134*  K 3.4* 4.2 4.3   < > 3.5 3.5 3.7 3.2*  CL 109 109 101   < > 95* 99 100 102  CO2 25 21* 29   < > 25 28 26 26   GLUCOSE 114* 102* 190*   < > 327* 157* 101* 130*  BUN 23 16 14    < > 32* 24* 17 17  CALCIUM 8.6* 8.8* 8.4*   < > 7.9* 8.1* 8.0* 7.8*  CREATININE 1.30* 1.45* 1.15   < > 1.56* 1.21 1.24 1.20  GFRNONAA 57* 50* >60   < > 48* >60 >60 >60  GFRAA >60 58* >60  --   --   --   --   --    < > = values in this interval not displayed.    LIVER FUNCTION TESTS: Recent Labs    03/13/21 1320 03/14/21 0038 03/15/21 0220 03/16/21 0226  BILITOT 0.7 1.1 1.0 0.8  AST 10* 10* 9* 12*  ALT 8 8 7 7   ALKPHOS 91 82 98 93  PROT 6.0* 5.5* 5.3* 5.3*  ALBUMIN 2.2* 1.9* 1.7* 1.6*    Assessment and Plan:  Right chest wall hematomas- infection 2 drains placed in IR 3/26 Will discuss with Dr Dwaine Gale about aspiration  And JP not holding charge Flush orders discussed with RN  Electronically Signed: Lavonia Drafts, PA-C 03/16/2021, 10:10 AM   I spent a total of 15 Minutes at the the patient's bedside AND on the patient's hospital floor or unit, greater than 50% of which was  counseling/coordinating care for chest wall abscess drains

## 2021-03-16 NOTE — Progress Notes (Addendum)
Patient called out in 10 out of 10 pain. Went into patient's room, patient resting comfortably. Was going to let patient rest and patient was woken up by family to let them know I was there. Then patient stated he  was in horrible pain and this pain medication was not enough but he would take it if it is all we have for him." Offered Tramadol , patient states that tramadol causes him to have seizures.

## 2021-03-16 NOTE — Progress Notes (Cosign Needed)
   Discussed case with Dr Dwaine Gale  Please continue drains as they are. Air with aspiration- likely secondary drain hole in tubing at site.  Continue flushes of collections for now It is OK that #1 does not charge-- again likely secondary drain hole in tubing at site.  We will follow closely

## 2021-03-17 ENCOUNTER — Inpatient Hospital Stay (HOSPITAL_COMMUNITY): Payer: Medicare Other

## 2021-03-17 DIAGNOSIS — R6520 Severe sepsis without septic shock: Secondary | ICD-10-CM | POA: Diagnosis not present

## 2021-03-17 DIAGNOSIS — A419 Sepsis, unspecified organism: Secondary | ICD-10-CM | POA: Diagnosis not present

## 2021-03-17 DIAGNOSIS — N179 Acute kidney failure, unspecified: Secondary | ICD-10-CM | POA: Diagnosis not present

## 2021-03-17 LAB — COMPREHENSIVE METABOLIC PANEL
ALT: 6 U/L (ref 0–44)
AST: 12 U/L — ABNORMAL LOW (ref 15–41)
Albumin: 1.6 g/dL — ABNORMAL LOW (ref 3.5–5.0)
Alkaline Phosphatase: 98 U/L (ref 38–126)
Anion gap: 8 (ref 5–15)
BUN: 14 mg/dL (ref 8–23)
CO2: 24 mmol/L (ref 22–32)
Calcium: 7.7 mg/dL — ABNORMAL LOW (ref 8.9–10.3)
Chloride: 102 mmol/L (ref 98–111)
Creatinine, Ser: 1.15 mg/dL (ref 0.61–1.24)
GFR, Estimated: >60 mL/min (ref >60)
Glucose, Bld: 198 mg/dL — ABNORMAL HIGH (ref 70–99)
Potassium: 3.5 mmol/L (ref 3.5–5.1)
Sodium: 134 mmol/L — ABNORMAL LOW (ref 135–145)
Total Bilirubin: 0.6 mg/dL (ref 0.3–1.2)
Total Protein: 5.2 g/dL — ABNORMAL LOW (ref 6.5–8.1)

## 2021-03-17 LAB — CBC WITH DIFFERENTIAL/PLATELET
Abs Immature Granulocytes: 0.07 10*3/uL (ref 0.00–0.07)
Basophils Absolute: 0 10*3/uL (ref 0.0–0.1)
Basophils Relative: 0 %
Eosinophils Absolute: 0.1 10*3/uL (ref 0.0–0.5)
Eosinophils Relative: 1 %
HCT: 29.6 % — ABNORMAL LOW (ref 39.0–52.0)
Hemoglobin: 10 g/dL — ABNORMAL LOW (ref 13.0–17.0)
Immature Granulocytes: 1 %
Lymphocytes Relative: 10 %
Lymphs Abs: 0.9 10*3/uL (ref 0.7–4.0)
MCH: 32.6 pg (ref 26.0–34.0)
MCHC: 33.8 g/dL (ref 30.0–36.0)
MCV: 96.4 fL (ref 80.0–100.0)
Monocytes Absolute: 1.1 10*3/uL — ABNORMAL HIGH (ref 0.1–1.0)
Monocytes Relative: 12 %
Neutro Abs: 7 10*3/uL (ref 1.7–7.7)
Neutrophils Relative %: 76 %
Platelets: 232 10*3/uL (ref 150–400)
RBC: 3.07 MIL/uL — ABNORMAL LOW (ref 4.22–5.81)
RDW: 13 % (ref 11.5–15.5)
WBC: 9.2 10*3/uL (ref 4.0–10.5)
nRBC: 0 % (ref 0.0–0.2)

## 2021-03-17 LAB — GLUCOSE, CAPILLARY
Glucose-Capillary: 142 mg/dL — ABNORMAL HIGH (ref 70–99)
Glucose-Capillary: 127 mg/dL — ABNORMAL HIGH (ref 70–99)
Glucose-Capillary: 116 mg/dL — ABNORMAL HIGH (ref 70–99)
Glucose-Capillary: 123 mg/dL — ABNORMAL HIGH (ref 70–99)

## 2021-03-17 LAB — PROCALCITONIN: Procalcitonin: 0.35 ng/mL

## 2021-03-17 LAB — MAGNESIUM: Magnesium: 1.6 mg/dL — ABNORMAL LOW (ref 1.7–2.4)

## 2021-03-17 MED ORDER — HEPARIN SODIUM (PORCINE) 5000 UNIT/ML IJ SOLN
5000.0000 [IU] | Freq: Three times a day (TID) | INTRAMUSCULAR | Status: DC
  Administered 2021-03-26 – 2021-04-09 (×29): 5000 [IU] via SUBCUTANEOUS
  Filled 2021-03-17 (×40): qty 1

## 2021-03-17 MED ORDER — MAGNESIUM SULFATE 2 GM/50ML IV SOLN
2.0000 g | Freq: Once | INTRAVENOUS | Status: AC
  Administered 2021-03-17: 2 g via INTRAVENOUS
  Filled 2021-03-17: qty 50

## 2021-03-17 MED ORDER — POTASSIUM CHLORIDE CRYS ER 20 MEQ PO TBCR
40.0000 meq | EXTENDED_RELEASE_TABLET | Freq: Once | ORAL | Status: AC
  Administered 2021-03-17: 40 meq via ORAL
  Filled 2021-03-17: qty 2

## 2021-03-17 MED ORDER — IOHEXOL 350 MG/ML SOLN
75.0000 mL | Freq: Once | INTRAVENOUS | Status: AC | PRN
  Administered 2021-03-17: 75 mL via INTRAVENOUS

## 2021-03-17 MED ORDER — HYDROCODONE-ACETAMINOPHEN 5-325 MG PO TABS
1.0000 | ORAL_TABLET | Freq: Four times a day (QID) | ORAL | Status: DC | PRN
  Administered 2021-03-17 – 2021-03-24 (×18): 2 via ORAL
  Administered 2021-03-24: 1 via ORAL
  Administered 2021-03-24 – 2021-03-26 (×7): 2 via ORAL
  Filled 2021-03-17 (×26): qty 2

## 2021-03-17 NOTE — Progress Notes (Addendum)
Patient reporting that his heart rate was in the 160's last night and this morning due to pain. Per night nurse patient's heart rate was in the 70's when it was checked. Checked his heart rate at this time and his heart rate was 98.

## 2021-03-17 NOTE — Progress Notes (Addendum)
Referring Physician(s): Dr Ronnie Derby  Supervising Physician: Ruthann Cancer  Patient Status:  [facility] - In-pt  Chief Complaint:  Infected hematomas- right anterior chest Procedure:US guided drain placement X 2- 03/14/21  Subjective:  Pt pulled drain #1 out last pm per RN CT today reviewed with Dr Serafina Royals:  Drains both seem to be out of collection Collection remains virtually unchanged Plan is to replace drains 3/30 per Dr Serafina Royals  Pt is aware and agreeable  Allergies: Adhesive [tape], Cyclobenzaprine, Duloxetine hcl, and Morphine  Medications: Prior to Admission medications   Medication Sig Start Date End Date Taking? Authorizing Provider  AIMOVIG 140 MG/ML SOAJ Inject 140 mg into the skin every 30 (thirty) days.  09/24/20  Yes [provider]  BELBUCA 150 MCG FILM Place 1 Film inside cheek every 12 (twelve) hours. 03/06/21  Yes [provider]  busPIRone (BUSPAR) 15 MG tablet Take 1 tablet (15 mg total) by mouth 3 (three) times daily. Patient taking differently: Take 30 mg by mouth 2 (two) times daily. 09/03/20  Yes Connye Burkitt, NP  busPIRone (BUSPAR) 30 MG tablet Take 30 mg by mouth 2 (two) times daily. 03/04/21  Yes [provider]  clonazePAM (KLONOPIN) 0.5 MG tablet Take 0.5 mg by mouth 3 (three) times daily. 10/01/19  Yes [provider]  ezetimibe (ZETIA) 10 MG tablet Take 10 mg by mouth daily.   Yes [provider]  isosorbide mononitrate (IMDUR) 60 MG 24 hr tablet TAKE 1 TABLET(60 MG) BY MOUTH DAILY Patient taking differently: Take 60 mg by mouth daily. 12/09/20  Yes Elouise Munroe, MD  loratadine (CLARITIN) 10 MG tablet Take 10 mg by mouth daily.    Yes [provider]  MAVYRET 100-40 MG TABS Take 3 tablets by mouth daily. 03/04/21  Yes [provider]  pantoprazole (PROTONIX) 40 MG tablet Take 40 mg by mouth daily before breakfast.  02/12/19  Yes [provider]  propranolol ER (INDERAL LA) 60 MG  24 hr capsule Take 60 mg by mouth daily.  08/28/20  Yes [provider]  Semaglutide,0.25 or 0.5MG /DOS, 2 MG/1.5ML SOPN Inject 0.5 mg into the skin every Friday.  10/08/19  Yes [provider]  tamsulosin (FLOMAX) 0.4 MG CAPS capsule Take 0.4 mg by mouth at bedtime.  02/12/19  Yes [provider]  tiZANidine (ZANAFLEX) 4 MG tablet Take 4 mg by mouth at bedtime.  03/08/19  Yes [provider]  TRIUMEQ 600-50-300 MG tablet Take 1 tablet by mouth daily.  02/12/19  Yes [provider]  valACYclovir (VALTREX) 1000 MG tablet Take 1,000 mg by mouth 3 (three) times daily as needed (as directed for trigeminal neuralgia flares). Trig neuralgia flare 10/16/19  Yes [provider]     Vital Signs: BP 124/73 (BP Location: Left Arm)   Pulse 98   Temp 98.4 F (36.9 C) (Axillary)   Resp 17   Ht 6' (1.829 m)   Wt 187 lb 13.3 oz (85.2 kg)   SpO2 98%   BMI 25.47 kg/m   Physical Exam Skin:    General: Skin is warm.     Comments: Large area of right chest with very red skin VERY tender to touch Some skin is peeling from area-- bleeding Again; severely tender  New vaseline gauze dressing placed  Drain #2 is intact: 30 cc pus like material in JP Did not remove drain-- as pt will be in IR tomorrow and we can do so then  Imaging: CT Chest W Contrast  Result Date: 03/13/2021 CLINICAL DATA:  Follow-up right chest wall hematoma and fractures of the right sixth through eighth ribs. Diabetes. HIV. EXAM: CT CHEST WITH CONTRAST TECHNIQUE: Multidetector CT imaging of the chest was performed during intravenous contrast administration. CONTRAST:  154mL OMNIPAQUE IOHEXOL 300 MG/ML  SOLN COMPARISON:  03/09/2021 FINDINGS: Cardiovascular: Heart size remains normal. No pericardial fluid. Coronary artery calcification as seen previously. Aortic atherosclerotic calcification as seen previously. Mediastinum/Nodes: Normal Lungs/Pleura: Scarring/atelectasis at the lung  bases left more than right unchanged since the previous study. Small areas of patchy density in the superior segment of the right lower lobe unchanged from the study of 4 days ago. No new or progressive pulmonary finding. No pneumothorax. Upper Abdomen: No upper abdominal injury or acute finding. Musculoskeletal: There is increasing size of the right chest wall collections, primarily centered around the right sixth and eighth anterolateral ribs. Those ribs show a pattern of irregular destruction that look more like pathologic fractures due to infection rather than traumatic fractures. Air bubbles are now present within the collections. There is mild extrapleural involvement in side of the ribcage, without frank breakthrough into the pleural space. Most of the collections manifest external to the ribcage. No new areas of involvement are seen. IMPRESSION: Increasing size of the right chest wall collections, primarily centered around the right sixth and eighth anterolateral ribs, with development of air bubbles. Those ribs show a pattern of irregular destruction that have progressed and look more like pathologic fractures due to infection rather than traumatic fractures. Air bubbles are now present within the collections and they probably represent abscesses. There is mild extrapleural involvement on the inner side of the ribcage, without frank breakthrough into the pleural space. The majority of the collections are on the outer side of the ribcage. No new areas of involvement are seen. Case discussed with Dr. Regenia Skeeter at 1419 hours. Aortic Atherosclerosis (ICD10-I70.0). Electronically Signed   By: Nelson Chimes M.D.   On: 03/13/2021 14:19   IR US Guide Bx Asp/Drain  Result Date: 03/14/2021 INDICATION: 70 year old with history of fall and right rib fractures. Patient developed right chest hematomas. Patient is complaining of pain and recent CT demonstrates gas within the hematomas. Findings are concerning for  infected hematomas. EXAM: PLACEMENT OF SUPERFICIAL RIGHT ANTERIOR CHEST WALL DRAIN USING ULTRASOUND GUIDANCE x 2 MEDICATIONS: Moderate sedation ANESTHESIA/SEDATION: Fentanyl 100 mcg IV; Versed 2.0 mg IV Moderate Sedation Time:  29 minutes The patient was continuously monitored during the procedure by the interventional radiology nurse under my direct supervision. COMPLICATIONS: None immediate. PROCEDURE: Informed written consent was obtained from the patient after a thorough discussion of the procedural risks, benefits and alternatives. All questions were addressed. Maximal Sterile Barrier Technique was utilized including caps, mask, sterile gowns, sterile gloves, sterile drape, hand hygiene and skin antiseptic. A timeout was performed prior to the initiation of the procedure. The right anterior chest wall hematomas were evaluated with ultrasound. Complex collections containing a small amount of fluid were obtained. The right anterior chest was prepped with chlorhexidine and sterile field was created. Attention was initially directed to the more inferior or caudal anterior chest wall collection. Skin was anesthetized with 1% lidocaine. Using ultrasound guidance, an 18 gauge trocar needle was directed into the collection and pink purulent fluid was aspirated. Superstiff Amplatz wire was advanced into the complex collection and the tract was dilated to accommodate a 10 [place] drain. Catheter was sutured to the skin and attached to a suction  bulb. A sample of fluid was sent for culture. A second area more cephalad was targeted with ultrasound guidance. Skin was anesthetized with 1% lidocaine. Using ultrasound guidance, an 18 gauge trocar needle was directed into the complex collection. Again, purulent fluid was aspirated. Superstiff Amplatz wire was advanced into this collection and a 10 French drain was placed. Additional purulent fluid was aspirated and the catheter was sutured to skin and attached to a suction bulb.  Dressings were placed over both drains. FINDINGS: Patient has palpable hematomas in the right anterior chest and upper abdomen region. Ultrasound demonstrates heterogeneous collections in both areas containing a small amount of compressible fluid. There are echogenic areas within the collections compatible with known gas. 10 French drains were placed in both areas and purulent fluid was draining from both collections. Both collections are very complex and compatible with infected hematomas. IMPRESSION: Ultrasound-guided placement of 2 percutaneous drains within the superficial right anterior chest wall collections. Findings are compatible with infected hematomas. Fluid sample was sent for culture. Electronically Signed   By: Markus Daft M.D.   On: 03/14/2021 18:37   IR US Guide Bx Asp/Drain  Result Date: 03/14/2021 INDICATION: 70 year old with history of fall and right rib fractures. Patient developed right chest hematomas. Patient is complaining of pain and recent CT demonstrates gas within the hematomas. Findings are concerning for infected hematomas. EXAM: PLACEMENT OF SUPERFICIAL RIGHT ANTERIOR CHEST WALL DRAIN USING ULTRASOUND GUIDANCE x 2 MEDICATIONS: Moderate sedation ANESTHESIA/SEDATION: Fentanyl 100 mcg IV; Versed 2.0 mg IV Moderate Sedation Time:  29 minutes The patient was continuously monitored during the procedure by the interventional radiology nurse under my direct supervision. COMPLICATIONS: None immediate. PROCEDURE: Informed written consent was obtained from the patient after a thorough discussion of the procedural risks, benefits and alternatives. All questions were addressed. Maximal Sterile Barrier Technique was utilized including caps, mask, sterile gowns, sterile gloves, sterile drape, hand hygiene and skin antiseptic. A timeout was performed prior to the initiation of the procedure. The right anterior chest wall hematomas were evaluated with ultrasound. Complex collections containing a  small amount of fluid were obtained. The right anterior chest was prepped with chlorhexidine and sterile field was created. Attention was initially directed to the more inferior or caudal anterior chest wall collection. Skin was anesthetized with 1% lidocaine. Using ultrasound guidance, an 18 gauge trocar needle was directed into the collection and pink purulent fluid was aspirated. Superstiff Amplatz wire was advanced into the complex collection and the tract was dilated to accommodate a 10 [place] drain. Catheter was sutured to the skin and attached to a suction bulb. A sample of fluid was sent for culture. A second area more cephalad was targeted with ultrasound guidance. Skin was anesthetized with 1% lidocaine. Using ultrasound guidance, an 18 gauge trocar needle was directed into the complex collection. Again, purulent fluid was aspirated. Superstiff Amplatz wire was advanced into this collection and a 10 French drain was placed. Additional purulent fluid was aspirated and the catheter was sutured to skin and attached to a suction bulb. Dressings were placed over both drains. FINDINGS: Patient has palpable hematomas in the right anterior chest and upper abdomen region. Ultrasound demonstrates heterogeneous collections in both areas containing a small amount of compressible fluid. There are echogenic areas within the collections compatible with known gas. 10 French drains were placed in both areas and purulent fluid was draining from both collections. Both collections are very complex and compatible with infected hematomas. IMPRESSION: Ultrasound-guided placement of 2 percutaneous  drains within the superficial right anterior chest wall collections. Findings are compatible with infected hematomas. Fluid sample was sent for culture. Electronically Signed   By: Markus Daft M.D.   On: 03/14/2021 18:37   DG Chest Port 1 View  Result Date: 03/13/2021 CLINICAL DATA:  Sepsis, right rib pain. EXAM: PORTABLE CHEST 1  VIEW COMPARISON:  March 09, 2021. FINDINGS: The heart size and mediastinal contours are within normal limits. Both lungs are clear. No pneumothorax or pleural effusion is noted. The visualized skeletal structures are unremarkable. IMPRESSION: No active disease. Electronically Signed   By: Marijo Conception M.D.   On: 03/13/2021 14:11    Labs:  CBC: Recent Labs    03/14/21 0038 03/15/21 0220 03/16/21 0226 03/17/21 0116  WBC 15.9* 12.2* 11.7* 9.2  HGB 10.0* 9.7* 10.2* 10.0*  HCT 29.8* 29.8* 30.9* 29.6*  PLT 219 209 219 232    COAGS: Recent Labs    11/03/20 1355 03/13/21 1320 03/14/21 0038  INR 1.0 1.3* 1.3*  APTT  --  37*  --     BMP: Recent Labs    06/02/20 2257 06/18/20 1918 09/01/20 2000 10/22/20 1629 03/14/21 0038 03/15/21 0220 03/16/21 0226 03/17/21 0116  NA 144 139 139   < > 134* 134* 134* 134*  K 3.4* 4.2 4.3   < > 3.5 3.7 3.2* 3.5  CL 109 109 101   < > 99 100 102 102  CO2 25 21* 29   < > 28 26 26 24   GLUCOSE 114* 102* 190*   < > 157* 101* 130* 198*  BUN 23 16 14    < > 24* 17 17 14   CALCIUM 8.6* 8.8* 8.4*   < > 8.1* 8.0* 7.8* 7.7*  CREATININE 1.30* 1.45* 1.15   < > 1.21 1.24 1.20 1.15  GFRNONAA 57* 50* >60   < > >60 >60 >60 >60  GFRAA >60 58* >60  --   --   --   --   --    < > = values in this interval not displayed.    LIVER FUNCTION TESTS: Recent Labs    03/14/21 0038 03/15/21 0220 03/16/21 0226 03/17/21 0116  BILITOT 1.1 1.0 0.8 0.6  AST 10* 9* 12* 12*  ALT 8 7 7 6   ALKPHOS 82 98 93 98  PROT 5.5* 5.3* 5.3* 5.2*  ALBUMIN 1.9* 1.7* 1.6* 1.6*    Assessment and Plan:  Rt chest wall infected hematoma Chest wall drains placed in IR 3/26 #1 drain is completely out #2 drain is in skin-- but barely Plan is to replace drains in IR tomorrow per Dr Serafina Royals Pt is aware and agreeable Consent signed and in chart  Electronically Signed: Lavonia Drafts, PA-C 03/17/2021, 1:35 PM   I spent a total of 25 Minutes at the the patient's bedside AND on  the patient's hospital floor or unit, greater than 50% of which was counseling/coordinating care for right chest wall abscess drain

## 2021-03-17 NOTE — Progress Notes (Signed)
OT Cancellation Note  Patient Details Name: Brian Malone MRN: [id number] DOB: Aug 03, 1951   Cancelled Treatment:    Reason Eval/Treat Not Completed: Patient at procedure or test/ unavailable OT will check back as time allows.  Jylan Loeza H.,OTR/L  Simona Rocque Elane Katana Berthold 03/17/2021, 1:43 PM

## 2021-03-17 NOTE — Progress Notes (Signed)
Patient told not to get out of bed without help due to dressing and tubes. Patient stated he can move himself and he would get out of bed when he wants. Patient in room walking around. Patient stated that his pain medication did not touch his pain. Patient does not have any pain medication that could be administered at this time.

## 2021-03-17 NOTE — Progress Notes (Signed)
Patient called out stating his pain was 10 out of 10. Went into room to administer medication and patient was asleep and resting comfortably.

## 2021-03-17 NOTE — Progress Notes (Signed)
    Discussed case with Dr Serafina Royals  Ordered CT Chest with Contrast for today

## 2021-03-17 NOTE — Progress Notes (Signed)
PROGRESS NOTE                                                                                                                                                                                                             Patient Demographics:    Brian Malone, is a 70 y.o. male, DOB - [birth year], XBM:[id number]  Outpatient Primary MD for the patient is [name], [name], MD    LOS - 4  Admit date - 03/13/2021    Chief Complaint  Patient presents with  . Rib Injury       Brief Narrative (HPI from H&P)  70 year old male with past medical history of hepatitis C, hypertension, hyperlipidemia, diabetes mellitus type 2, chronic pain syndrome, intravenous drug use (abstinent since 2019), major depressive disorder and HIV who had a trip and fall in his yard on 02/22/2021 after which he hurt his right-sided chest wall, at that time he was diagnosed with right-sided rib cage fracture and injury, he was seen in the ER a few times since then and was treated conservatively, his pain and discomfort continued to get worse and he presented again to [place] on  03/13/2021 CT scan suggested that he had right chest wall abscess formation and cellulitis with concerns for sepsis.   Subjective:   Patient in bed appears to be in no distress however complains of 10 out of 10 pain and wants more narcotics to be given, no shortness of breath no focal weakness.   Assessment  & Plan :     1. Sepsis due to right-sided chest wall abscess formation after mechanical fall and multiple right rib fractures sustained on 02/22/2021 - he being treated with empiric IV antibiotics along with IV fluids, currently sepsis pathophysiology has improved, Dr. Kipp Brood cardiothoracic surgery was consulted in the ER who suggested ultrasound-guided drainage of the abscess by IR, this was done by IR on 03/14/2021, infected  hematoma/abscess which was drained, 2 JP drains in place on 03/15/2021, continue IV antibiotics for now, likely growing staph aureus-follow final cultures.    Note patient pulled out one of his 2 JP drains on 2021/03/25, he is exhibiting narcotic seeking behavior, has been warned multiple times as this can lead to accidental overdose and even death friend in the room on 03/25/21 also made aware of the concerns.  IR  following and doing another CT scan on 03/17/2021.  Kindly see nursing documentation as well.  Encouraged the patient to sit up in chair in the daytime use I-S and flutter valve for pulmonary toiletry.  Will advance activity and titrate down oxygen as possible.  SpO2: 98 % O2 Flow Rate (L/min): 2 L/min   2.  Chronic pain and narcotic use.  Supportive care as needed for acute discomfort, avoid overuse, as needed Narcan added.  Patient counseled see above.  3.  Continue home regimen follows with [facility] ID department.  4.  Anxiety and depression.  Home medications continued.  Currently not homicidal or suicidal.  5.  GERD.  On PPI.  6.  BPH.  On Flomax.  7.  AKI.  Improved after IV fluids likely due to ATN from sepsis.  8.  Hypertension.  Blood pressure soft, skipped today's blood pressure medications will monitor closely.   9.  DM type II.  On sliding scale monitor and adjust  Lab Results  Component Value Date   HGBA1C 8.3 (H) 03/14/2021   CBG (last 3)  Recent Labs    03/16/21 1134 03/16/21 1653 03/17/21 0744  GLUCAP 132* 143* 142*          Condition - Extremely Guarded  Family Communication  : None present bedside  Code Status :  Full  Consults  :  IR  PUD Prophylaxis : PPI   Procedures  :     US guided chest wall abscess drined by IR 03/14/21   CT -  Increasing size of the right chest wall collections, primarily centered around the right sixth and eighth anterolateral ribs, with development of air bubbles. Those ribs show a pattern of irregular  destruction that have progressed and look more like pathologic fractures due to infection rather than traumatic fractures. Air bubbles are now present within the collections and they probably represent abscesses. There is mild extrapleural involvement on the inner side of the ribcage, without frank breakthrough into the pleural space. The majority of the collections are on the outer side of the ribcage. No new areas of involvement are seen.      Disposition Plan  :    Status is: Inpatient  Remains inpatient appropriate because:IV treatments appropriate due to intensity of illness or inability to take PO   Dispo: The patient is from: Home              Anticipated d/c is to: Home              Patient currently is not medically stable to d/c.   Difficult to place patient No  DVT Prophylaxis  :   Heparin   Lab Results  Component Value Date   PLT 232 03/17/2021    Diet :  Diet Order            DIET SOFT Room service appropriate? Yes with Assist; Fluid consistency: Thin  Diet effective now                  Inpatient Medications  Scheduled Meds: . abacavir-dolutegravir-lamiVUDine  1 tablet Oral Daily  . busPIRone  30 mg Oral BID  . Chlorhexidine Gluconate Cloth  6 each Topical Q0600  . clonazepam  0.5 mg Oral TID  . ezetimibe  10 mg Oral Daily  . feeding supplement (GLUCERNA SHAKE)  237 mL Oral TID BM  . heparin injection (subcutaneous)  5,000 Units Subcutaneous Q8H  . insulin aspart  0-15 Units Subcutaneous  TID AC & HS  . isosorbide mononitrate  30 mg Oral Daily  . multivitamin with minerals  1 tablet Oral Daily  . mupirocin ointment  1 application Nasal BID  . pantoprazole  40 mg Oral QAC breakfast  . sodium chloride flush  5 mL Intracatheter Q8H  . tamsulosin  0.4 mg Oral QHS  . tiZANidine  2 mg Oral QHS   Continuous Infusions: . sodium chloride Stopped (03/13/21 2106)  . vancomycin Stopped (03/16/21 1417)   PRN Meds:.sodium chloride, acetaminophen **OR**  [DISCONTINUED] acetaminophen, antiseptic oral rinse, HYDROcodone-acetaminophen, HYDROmorphone (DILAUDID) injection **OR** [DISCONTINUED]  HYDROmorphone (DILAUDID) injection, naLOXone (NARCAN)  injection, [DISCONTINUED] ondansetron **OR** ondansetron (ZOFRAN) IV, polyethylene glycol, valACYclovir  Antibiotics  :    Anti-infectives (From admission, onward)   Start     Dose/Rate Route Frequency Ordered Stop   03/15/21 1200  vancomycin (VANCOREADY) IVPB 1500 mg/300 mL        1,500 mg 150 mL/hr over 120 Minutes Intravenous Every 24 hours 03/15/21 0732     03/14/21 1200  vancomycin (VANCOREADY) IVPB 1250 mg/250 mL  Status:  Discontinued        1,250 mg 166.7 mL/hr over 90 Minutes Intravenous Every 24 hours 03/13/21 1359 03/15/21 0732   03/14/21 1023  valACYclovir (VALTREX) tablet 1,000 mg       Note to Pharmacy: Trig neuralgia flare     1,000 mg Oral 3 times daily PRN 03/14/21 1023     03/14/21 1000  abacavir-dolutegravir-lamiVUDine (TRIUMEQ) 600-50-300 MG per tablet 1 tablet        1 tablet Oral Daily 03/13/21 2320     03/14/21 0200  meropenem (MERREM) 1 g in sodium chloride 0.9 % 100 mL IVPB  Status:  Discontinued        1 g 200 mL/hr over 30 Minutes Intravenous Every 8 hours 03/13/21 2337 03/16/21 0719   03/14/21 0100  ceFEPIme (MAXIPIME) 2 g in sodium chloride 0.9 % 100 mL IVPB  Status:  Discontinued       Note to Pharmacy: Cefepime 2 g IV q12h for CrCl < 60 mL/min   2 g 200 mL/hr over 30 Minutes Intravenous Every 12 hours 03/13/21 2326 03/13/21 2331   03/13/21 2000  piperacillin-tazobactam (ZOSYN) IVPB 3.375 g  Status:  Discontinued        3.375 g 12.5 mL/hr over 240 Minutes Intravenous Every 8 hours 03/13/21 1402 03/13/21 2320   03/13/21 1430  clindamycin (CLEOCIN) IVPB 900 mg        900 mg 100 mL/hr over 30 Minutes Intravenous  Once 03/13/21 1418 03/13/21 1548   03/13/21 1230  vancomycin (VANCOCIN) IVPB 1000 mg/200 mL premix        1,000 mg 200 mL/hr over 60 Minutes Intravenous  Once  03/13/21 1229 03/13/21 1506   03/13/21 1230  piperacillin-tazobactam (ZOSYN) IVPB 3.375 g        3.375 g 100 mL/hr over 30 Minutes Intravenous  Once 03/13/21 1229 03/13/21 1400       Time Spent in minutes  30   Lala Lund M.D on 03/17/2021 at 11:03 AM  To page go to [email or web address]   [facility] -  Office  [phone]    See all Orders from today for further details    Objective:   Vitals:   03/16/21 1138 03/16/21 1949 03/17/21 0031 03/17/21 0418  BP: 137/73  (!) 155/94 124/73  Pulse: 96 76 (!) 106 98  Resp: 14  16 17   Temp: (!) 97.4 F (36.3  C)  98.4 F (36.9 C) 98.4 F (36.9 C)  TempSrc: Oral  Oral Axillary  SpO2: 97% 99% 98% 98%  Weight:      Height:        Wt Readings from Last 3 Encounters:  03/16/21 85.2 kg  03/09/21 77.1 kg  01/22/21 72.6 kg     Intake/Output Summary (Last 24 hours) at 03/17/2021 1103 Last data filed at 03/17/2021 0815 Gross per 24 hour  Intake 610 ml  Output 1420 ml  Net -810 ml   Physical Exam  Awake Alert, No new F.N deficits,  [place].AT,PERRAL Supple Neck,No JVD, No cervical lymphadenopathy appriciated.  Symmetrical Chest wall movement, Good air movement bilaterally, CTAB RRR,No Gallops, Rubs or new Murmurs, No Parasternal Heave +ve B.Sounds, Abd Soft, No tenderness, No organomegaly appriciated, No rebound - guarding or rigidity. No Cyanosis, Clubbing or edema, No new Rash or bruise  L chest wall below on 03/14/20( now 2 JP drains)         Data Review:    CBC Recent Labs  Lab 03/13/21 1320 03/14/21 0038 03/15/21 0220 03/16/21 0226 03/17/21 0116  WBC 18.1* 15.9* 12.2* 11.7* 9.2  HGB 10.5* 10.0* 9.7* 10.2* 10.0*  HCT 31.8* 29.8* 29.8* 30.9* 29.6*  PLT 262 219 209 219 232  MCV 99.1 98.0 98.3 98.4 96.4  MCH 32.7 32.9 32.0 32.5 32.6  MCHC 33.0 33.6 32.6 33.0 33.8  RDW 12.9 12.8 13.2 13.0 13.0  LYMPHSABS 0.5* 0.4* 0.5* 0.8 0.9  MONOABS 1.0 0.7 0.9 1.2* 1.1*  EOSABS 0.1 0.1 0.1 0.1 0.1  BASOSABS 0.0 0.0  0.0 0.0 0.0    Recent Labs  Lab 03/13/21 1320 03/13/21 1615 03/14/21 0038 03/15/21 0220 03/16/21 0226 03/17/21 0116  NA 131*  --  134* 134* 134* 134*  K 3.5  --  3.5 3.7 3.2* 3.5  CL 95*  --  99 100 102 102  CO2 25  --  28 26 26 24   GLUCOSE 327*  --  157* 101* 130* 198*  BUN 32*  --  24* 17 17 14   CREATININE 1.56*  --  1.21 1.24 1.20 1.15  CALCIUM 7.9*  --  8.1* 8.0* 7.8* 7.7*  AST 10*  --  10* 9* 12* 12*  ALT 8  --  8 7 7 6   ALKPHOS 91  --  82 98 93 98  BILITOT 0.7  --  1.1 1.0 0.8 0.6  ALBUMIN 2.2*  --  1.9* 1.7* 1.6* 1.6*  MG  --   --  1.4* 1.8 1.7 1.6*  PROCALCITON  --   --  0.56 1.00 0.68 0.35  LATICACIDVEN 1.9 2.4* 1.8  --   --   --   INR 1.3*  --  1.3*  --   --   --   HGBA1C  --   --  8.3*  --   --   --     ------------------------------------------------------------------------------------------------------------------ No results for input(s): CHOL, HDL, LDLCALC, TRIG, CHOLHDL, LDLDIRECT in the last 72 hours.  Lab Results  Component Value Date   HGBA1C 8.3 (H) 03/14/2021   ------------------------------------------------------------------------------------------------------------------ No results for input(s): TSH, T4TOTAL, T3FREE, THYROIDAB in the last 72 hours.  Invalid input(s): FREET3  Cardiac Enzymes No results for input(s): CKMB, TROPONINI, MYOGLOBIN in the last 168 hours.  Invalid input(s): CK ------------------------------------------------------------------------------------------------------------------ No results found for: BNP  Micro Results Recent Results (from the past 240 hour(s))  Urine culture     Status: None   Collection Time: 03/13/21 12:25  AM   Specimen: In/Out Cath Urine  Result Value Ref Range Status   Specimen Description IN/OUT CATH URINE  Final   Special Requests NONE  Final   Culture   Final    NO GROWTH Performed at [facility], [address] [place], [place] [address]    Report Status 03/15/2021 FINAL  Final   Blood Culture (routine x 2)     Status: None (Preliminary result)   Collection Time: 03/13/21 12:25 PM   Specimen: Right Antecubital; Blood  Result Value Ref Range Status   Specimen Description   Final    RIGHT ANTECUBITAL BLOOD Performed at [facility], [address] [place], [place] [address]    Special Requests   Final    BOTTLES DRAWN AEROBIC AND ANAEROBIC Blood Culture results may not be optimal due to an inadequate volume of blood received in culture bottles Performed at [facility], [address] [place], [place] [address]    Culture   Final    NO GROWTH 4 DAYS Performed at [facility], [place] [address], [place], [place] [address]    Report Status PENDING  Incomplete  Blood Culture (routine x 2)     Status: None (Preliminary result)   Collection Time: 03/13/21  1:15 PM   Specimen: BLOOD RIGHT HAND  Result Value Ref Range Status   Specimen Description   Final    BLOOD RIGHT HAND BLOOD Performed at [facility], [address] [place], [place] [address]    Special Requests   Final    BOTTLES DRAWN AEROBIC AND ANAEROBIC Blood Culture results may not be optimal due to an inadequate volume of blood received in culture bottles Performed at [facility], [address] [place], [place] [address]    Culture   Final    NO GROWTH 4 DAYS Performed at [facility], [place] [address], [place], [place] [address]    Report Status PENDING  Incomplete  Resp Panel by RT-PCR (Flu A&B, Covid) Nasopharyngeal Swab     Status: None   Collection Time: 03/13/21  2:01 PM   Specimen: Nasopharyngeal Swab; Nasopharyngeal(NP) swabs in vial transport medium  Result Value Ref Range Status   SARS Coronavirus 2 by RT PCR NEGATIVE NEGATIVE Final    Comment: (NOTE) SARS-CoV-2 target nucleic acids are NOT DETECTED.  The SARS-CoV-2 RNA is generally detectable in upper respiratory specimens during the acute phase of infection. The  lowest concentration of SARS-CoV-2 viral copies this assay can detect is 138 copies/mL. A negative result does not preclude SARS-Cov-2 infection and should not be used as the sole basis for treatment or other patient management decisions. A negative result may occur with  improper specimen collection/handling, submission of specimen other than nasopharyngeal swab, presence of viral mutation(s) within the areas targeted by this assay, and inadequate number of viral copies(<138 copies/mL). A negative result must be combined with clinical observations, patient history, and epidemiological information. The expected result is Negative.  Fact Sheet for Patients:  EntrepreneurPulse.com.au  Fact Sheet for Healthcare Providers:  IncredibleEmployment.be  This test is no t yet approved or cleared by the [place] FDA and  has been authorized for detection and/or diagnosis of SARS-CoV-2 by FDA under an Emergency Use Authorization (EUA). This EUA will remain  in effect (meaning this test can be used) for the duration of the COVID-19 declaration under Section 564(b)(1) of the Act,  21 U.S.C.section 360bbb-3(b)(1), unless the authorization is terminated  or revoked sooner.       Influenza A by PCR NEGATIVE NEGATIVE Final   Influenza B by PCR NEGATIVE NEGATIVE Final    Comment: (NOTE) The Xpert Xpress SARS-CoV-2/FLU/RSV plus assay is intended as an aid in the diagnosis of influenza from Nasopharyngeal swab specimens and should not be used as a sole basis for treatment. Nasal washings and aspirates are unacceptable for Xpert Xpress SARS-CoV-2/FLU/RSV testing.  Fact Sheet for Patients: EntrepreneurPulse.com.au  Fact Sheet for Healthcare Providers: IncredibleEmployment.be  This test is not yet approved or cleared by the [place] FDA and has been authorized for detection and/or diagnosis of SARS-CoV-2 by FDA under  an Emergency Use Authorization (EUA). This EUA will remain in effect (meaning this test can be used) for the duration of the COVID-19 declaration under Section 564(b)(1) of the Act, 21 U.S.C. section 360bbb-3(b)(1), unless the authorization is terminated or revoked.  Performed at [facility], [address] [place], [place] [address]   MRSA PCR Screening     Status: Abnormal   Collection Time: 03/13/21 11:51 PM   Specimen: Nasal Mucosa; Nasopharyngeal  Result Value Ref Range Status   MRSA by PCR POSITIVE (A) NEGATIVE Final    Comment:        The GeneXpert MRSA Assay (FDA approved for NASAL specimens only), is one component of a comprehensive MRSA colonization surveillance program. It is not intended to diagnose MRSA infection nor to guide or monitor treatment for MRSA infections. RESULT CALLED TO, READ BACK BY AND VERIFIED WITHDorna Bloom RN 03/14/21 0432 JDW Performed at [place] [address], [place], [place] [address]   Aerobic/Anaerobic Culture (surgical/deep wound)     Status: None (Preliminary result)   Collection Time: 03/14/21  1:23 PM   Specimen: Abscess  Result Value Ref Range Status   Specimen Description ABSCESS  Final   Special Requests NONE  Final   Gram Stain   Final    ABUNDANT WBC PRESENT, PREDOMINANTLY PMN ABUNDANT GRAM POSITIVE COCCI IN CLUSTERS Performed at [facility], [place] [address], [place], [place] [address]    Culture   Final    ABUNDANT METHICILLIN RESISTANT STAPHYLOCOCCUS AUREUS NO ANAEROBES ISOLATED; CULTURE IN PROGRESS FOR 5 DAYS    Report Status PENDING  Incomplete   Organism ID, Bacteria METHICILLIN RESISTANT STAPHYLOCOCCUS AUREUS  Final      Susceptibility   Methicillin resistant staphylococcus aureus - MIC*    CIPROFLOXACIN >=8 RESISTANT Resistant     ERYTHROMYCIN >=8 RESISTANT Resistant     GENTAMICIN <=0.5 SENSITIVE Sensitive     OXACILLIN >=4 RESISTANT Resistant     TETRACYCLINE <=1 SENSITIVE  Sensitive     VANCOMYCIN <=0.5 SENSITIVE Sensitive     TRIMETH/SULFA >=320 RESISTANT Resistant     CLINDAMYCIN >=8 RESISTANT Resistant     RIFAMPIN <=0.5 SENSITIVE Sensitive     Inducible Clindamycin NEGATIVE Sensitive     * ABUNDANT METHICILLIN RESISTANT STAPHYLOCOCCUS AUREUS    Radiology Reports CT Chest W Contrast  Result Date: 03/13/2021 CLINICAL DATA:  Follow-up right chest wall hematoma and fractures of the right sixth through eighth ribs. Diabetes. HIV. EXAM: CT CHEST WITH CONTRAST TECHNIQUE: Multidetector CT imaging of the chest was performed during intravenous contrast administration. CONTRAST:  142mL OMNIPAQUE IOHEXOL 300 MG/ML  SOLN COMPARISON:  03/09/2021 FINDINGS: Cardiovascular: Heart size remains normal. No pericardial fluid. Coronary artery calcification as seen previously. Aortic atherosclerotic calcification  as seen previously. Mediastinum/Nodes: Normal Lungs/Pleura: Scarring/atelectasis at the lung bases left more than right unchanged since the previous study. Small areas of patchy density in the superior segment of the right lower lobe unchanged from the study of 4 days ago. No new or progressive pulmonary finding. No pneumothorax. Upper Abdomen: No upper abdominal injury or acute finding. Musculoskeletal: There is increasing size of the right chest wall collections, primarily centered around the right sixth and eighth anterolateral ribs. Those ribs show a pattern of irregular destruction that look more like pathologic fractures due to infection rather than traumatic fractures. Air bubbles are now present within the collections. There is mild extrapleural involvement in side of the ribcage, without frank breakthrough into the pleural space. Most of the collections manifest external to the ribcage. No new areas of involvement are seen. IMPRESSION: Increasing size of the right chest wall collections, primarily centered around the right sixth and eighth anterolateral ribs, with  development of air bubbles. Those ribs show a pattern of irregular destruction that have progressed and look more like pathologic fractures due to infection rather than traumatic fractures. Air bubbles are now present within the collections and they probably represent abscesses. There is mild extrapleural involvement on the inner side of the ribcage, without frank breakthrough into the pleural space. The majority of the collections are on the outer side of the ribcage. No new areas of involvement are seen. Case discussed with Dr. Regenia Skeeter at 1419 hours. Aortic Atherosclerosis (ICD10-I70.0). Electronically Signed   By: Nelson Chimes M.D.   On: 03/13/2021 14:19   CT Chest W Contrast  Result Date: 03/09/2021 CLINICAL DATA:  Fall 3 weeks prior with right rib fractures and persistent chest pain. EXAM: CT CHEST WITH CONTRAST TECHNIQUE: Multidetector CT imaging of the chest was performed during intravenous contrast administration. CONTRAST:  20mL OMNIPAQUE IOHEXOL 300 MG/ML  SOLN COMPARISON:  Chest radiograph from earlier today. 11/22/2019 chest CT angiogram. FINDINGS: Cardiovascular: Normal heart size. No significant pericardial effusion/thickening. Three-vessel coronary atherosclerosis. Atherosclerotic nonaneurysmal thoracic aorta. Top-normal caliber main pulmonary artery (3.0 cm diameter). No central pulmonary emboli. Mediastinum/Nodes: No discrete thyroid nodules. Unremarkable esophagus. No pathologically enlarged axillary, mediastinal or hilar lymph nodes. Lungs/Pleura: No pneumothorax. No pleural effusion. No acute consolidative airspace disease, lung masses or significant pulmonary nodules. Mild platelike atelectasis in the anterior basilar left lower lobe. Mild patchy ground-glass opacity in peripheral posterior right mid lung. Upper abdomen: No acute abnormality. Musculoskeletal: No aggressive appearing focal osseous lesions. Nondisplaced acute anterior right sixth, seventh and eighth rib fractures with  surrounding chest wall hematoma measuring up to 8.1 x 4.9 cm in maximum axial dimensions at the level of the anterior right sixth rib fracture (series 2/image 114). Moderate thoracic spondylosis. IMPRESSION: 1. Nondisplaced acute anterior right sixth, seventh and eighth rib fractures with surrounding chest wall hematoma. No pneumothorax or hemothorax. No discrete bone lesions. Clinical follow-up advised to ensure resolution of the right chest wall hematoma. Any need for follow-up imaging should be based on clinical assessment. 2. Mild patchy ground-glass opacity in the peripheral posterior right mid lung, nonspecific, favor mild pulmonary contusion. 3. Mild platelike atelectasis at the left lung base. 4. Three-vessel coronary atherosclerosis. 5. Aortic Atherosclerosis (ICD10-I70.0). Electronically Signed   By: Ilona Sorrel M.D.   On: 03/09/2021 13:03   IR US Guide Bx Asp/Drain  Result Date: 03/14/2021 INDICATION: 70 year old with history of fall and right rib fractures. Patient developed right chest hematomas. Patient is complaining of pain and recent CT demonstrates gas within  the hematomas. Findings are concerning for infected hematomas. EXAM: PLACEMENT OF SUPERFICIAL RIGHT ANTERIOR CHEST WALL DRAIN USING ULTRASOUND GUIDANCE x 2 MEDICATIONS: Moderate sedation ANESTHESIA/SEDATION: Fentanyl 100 mcg IV; Versed 2.0 mg IV Moderate Sedation Time:  29 minutes The patient was continuously monitored during the procedure by the interventional radiology nurse under my direct supervision. COMPLICATIONS: None immediate. PROCEDURE: Informed written consent was obtained from the patient after a thorough discussion of the procedural risks, benefits and alternatives. All questions were addressed. Maximal Sterile Barrier Technique was utilized including caps, mask, sterile gowns, sterile gloves, sterile drape, hand hygiene and skin antiseptic. A timeout was performed prior to the initiation of the procedure. The right anterior  chest wall hematomas were evaluated with ultrasound. Complex collections containing a small amount of fluid were obtained. The right anterior chest was prepped with chlorhexidine and sterile field was created. Attention was initially directed to the more inferior or caudal anterior chest wall collection. Skin was anesthetized with 1% lidocaine. Using ultrasound guidance, an 18 gauge trocar needle was directed into the collection and pink purulent fluid was aspirated. Superstiff Amplatz wire was advanced into the complex collection and the tract was dilated to accommodate a 10 [place] drain. Catheter was sutured to the skin and attached to a suction bulb. A sample of fluid was sent for culture. A second area more cephalad was targeted with ultrasound guidance. Skin was anesthetized with 1% lidocaine. Using ultrasound guidance, an 18 gauge trocar needle was directed into the complex collection. Again, purulent fluid was aspirated. Superstiff Amplatz wire was advanced into this collection and a 10 French drain was placed. Additional purulent fluid was aspirated and the catheter was sutured to skin and attached to a suction bulb. Dressings were placed over both drains. FINDINGS: Patient has palpable hematomas in the right anterior chest and upper abdomen region. Ultrasound demonstrates heterogeneous collections in both areas containing a small amount of compressible fluid. There are echogenic areas within the collections compatible with known gas. 10 French drains were placed in both areas and purulent fluid was draining from both collections. Both collections are very complex and compatible with infected hematomas. IMPRESSION: Ultrasound-guided placement of 2 percutaneous drains within the superficial right anterior chest wall collections. Findings are compatible with infected hematomas. Fluid sample was sent for culture. Electronically Signed   By: Markus Daft M.D.   On: 03/14/2021 18:37   IR US Guide Bx  Asp/Drain  Result Date: 03/14/2021 INDICATION: 70 year old with history of fall and right rib fractures. Patient developed right chest hematomas. Patient is complaining of pain and recent CT demonstrates gas within the hematomas. Findings are concerning for infected hematomas. EXAM: PLACEMENT OF SUPERFICIAL RIGHT ANTERIOR CHEST WALL DRAIN USING ULTRASOUND GUIDANCE x 2 MEDICATIONS: Moderate sedation ANESTHESIA/SEDATION: Fentanyl 100 mcg IV; Versed 2.0 mg IV Moderate Sedation Time:  29 minutes The patient was continuously monitored during the procedure by the interventional radiology nurse under my direct supervision. COMPLICATIONS: None immediate. PROCEDURE: Informed written consent was obtained from the patient after a thorough discussion of the procedural risks, benefits and alternatives. All questions were addressed. Maximal Sterile Barrier Technique was utilized including caps, mask, sterile gowns, sterile gloves, sterile drape, hand hygiene and skin antiseptic. A timeout was performed prior to the initiation of the procedure. The right anterior chest wall hematomas were evaluated with ultrasound. Complex collections containing a small amount of fluid were obtained. The right anterior chest was prepped with chlorhexidine and sterile field was created. Attention was initially directed to the  more inferior or caudal anterior chest wall collection. Skin was anesthetized with 1% lidocaine. Using ultrasound guidance, an 18 gauge trocar needle was directed into the collection and pink purulent fluid was aspirated. Superstiff Amplatz wire was advanced into the complex collection and the tract was dilated to accommodate a 10 [place] drain. Catheter was sutured to the skin and attached to a suction bulb. A sample of fluid was sent for culture. A second area more cephalad was targeted with ultrasound guidance. Skin was anesthetized with 1% lidocaine. Using ultrasound guidance, an 18 gauge trocar needle was directed into  the complex collection. Again, purulent fluid was aspirated. Superstiff Amplatz wire was advanced into this collection and a 10 French drain was placed. Additional purulent fluid was aspirated and the catheter was sutured to skin and attached to a suction bulb. Dressings were placed over both drains. FINDINGS: Patient has palpable hematomas in the right anterior chest and upper abdomen region. Ultrasound demonstrates heterogeneous collections in both areas containing a small amount of compressible fluid. There are echogenic areas within the collections compatible with known gas. 10 French drains were placed in both areas and purulent fluid was draining from both collections. Both collections are very complex and compatible with infected hematomas. IMPRESSION: Ultrasound-guided placement of 2 percutaneous drains within the superficial right anterior chest wall collections. Findings are compatible with infected hematomas. Fluid sample was sent for culture. Electronically Signed   By: Markus Daft M.D.   On: 03/14/2021 18:37   DG Chest Port 1 View  Result Date: 03/13/2021 CLINICAL DATA:  Sepsis, right rib pain. EXAM: PORTABLE CHEST 1 VIEW COMPARISON:  March 09, 2021. FINDINGS: The heart size and mediastinal contours are within normal limits. Both lungs are clear. No pneumothorax or pleural effusion is noted. The visualized skeletal structures are unremarkable. IMPRESSION: No active disease. Electronically Signed   By: Marijo Conception M.D.   On: 03/13/2021 14:11   DG Chest Portable 1 View  Result Date: 03/09/2021 CLINICAL DATA:  Chest pain. Chest [place] hurting late yesterday. Complains of right-sided sternal pain and chest pain. EXAM: PORTABLE CHEST 1 VIEW COMPARISON:  None. FINDINGS: The heart size and mediastinal contours are within normal limits. Both lungs are clear. Blunting of the left costophrenic angle is noted, new from prior studies. The visualized skeletal structures are unremarkable. IMPRESSION: New  blunting of left costophrenic angle may reflect small effusion. The lungs are otherwise clear. No signs of interstitial edema. Electronically Signed   By: Kerby Moors M.D.   On: 03/09/2021 11:26

## 2021-03-17 NOTE — Progress Notes (Signed)
   Informed by RN that pt pulled out drain #1 last night  Will discuss with IR Rad this am Will let RN know plan asap.

## 2021-03-17 NOTE — Progress Notes (Addendum)
Patient woken up by transport. Patient refusing to go to CT until he had his pain medication. Patient said he was in unbearable pain.Explained to patient that I came by earlier to give him his medication but he was asleep. He said "yes, he was asleep because he feel asleep meditating to make the pain go away". Administered medication.

## 2021-03-18 ENCOUNTER — Inpatient Hospital Stay (HOSPITAL_COMMUNITY): Payer: Medicare Other

## 2021-03-18 DIAGNOSIS — L02213 Cutaneous abscess of chest wall: Secondary | ICD-10-CM | POA: Diagnosis not present

## 2021-03-18 DIAGNOSIS — A419 Sepsis, unspecified organism: Secondary | ICD-10-CM | POA: Diagnosis not present

## 2021-03-18 DIAGNOSIS — R6520 Severe sepsis without septic shock: Secondary | ICD-10-CM | POA: Diagnosis not present

## 2021-03-18 DIAGNOSIS — B20 Human immunodeficiency virus [HIV] disease: Secondary | ICD-10-CM

## 2021-03-18 DIAGNOSIS — N179 Acute kidney failure, unspecified: Secondary | ICD-10-CM | POA: Diagnosis not present

## 2021-03-18 LAB — COMPREHENSIVE METABOLIC PANEL
ALT: 8 U/L (ref 0–44)
AST: 13 U/L — ABNORMAL LOW (ref 15–41)
Albumin: 1.7 g/dL — ABNORMAL LOW (ref 3.5–5.0)
Alkaline Phosphatase: 82 U/L (ref 38–126)
Anion gap: 7 (ref 5–15)
BUN: 10 mg/dL (ref 8–23)
CO2: 26 mmol/L (ref 22–32)
Calcium: 8.1 mg/dL — ABNORMAL LOW (ref 8.9–10.3)
Chloride: 100 mmol/L (ref 98–111)
Creatinine, Ser: 1.1 mg/dL (ref 0.61–1.24)
GFR, Estimated: >60 mL/min (ref >60)
Glucose, Bld: 104 mg/dL — ABNORMAL HIGH (ref 70–99)
Potassium: 3.8 mmol/L (ref 3.5–5.1)
Sodium: 133 mmol/L — ABNORMAL LOW (ref 135–145)
Total Bilirubin: 0.6 mg/dL (ref 0.3–1.2)
Total Protein: 5.5 g/dL — ABNORMAL LOW (ref 6.5–8.1)

## 2021-03-18 LAB — CULTURE, BLOOD (ROUTINE X 2)
Culture: NO GROWTH
Culture: NO GROWTH

## 2021-03-18 LAB — CBC WITH DIFFERENTIAL/PLATELET
Abs Immature Granulocytes: 0.07 10*3/uL (ref 0.00–0.07)
Basophils Absolute: 0 10*3/uL (ref 0.0–0.1)
Basophils Relative: 0 %
Eosinophils Absolute: 0.1 10*3/uL (ref 0.0–0.5)
Eosinophils Relative: 1 %
HCT: 29.8 % — ABNORMAL LOW (ref 39.0–52.0)
Hemoglobin: 10.2 g/dL — ABNORMAL LOW (ref 13.0–17.0)
Immature Granulocytes: 1 %
Lymphocytes Relative: 13 %
Lymphs Abs: 1.1 10*3/uL (ref 0.7–4.0)
MCH: 32.9 pg (ref 26.0–34.0)
MCHC: 34.2 g/dL (ref 30.0–36.0)
MCV: 96.1 fL (ref 80.0–100.0)
Monocytes Absolute: 1 10*3/uL (ref 0.1–1.0)
Monocytes Relative: 13 %
Neutro Abs: 5.8 10*3/uL (ref 1.7–7.7)
Neutrophils Relative %: 72 %
Platelets: 206 10*3/uL (ref 150–400)
RBC: 3.1 MIL/uL — ABNORMAL LOW (ref 4.22–5.81)
RDW: 12.9 % (ref 11.5–15.5)
WBC: 8 10*3/uL (ref 4.0–10.5)
nRBC: 0 % (ref 0.0–0.2)

## 2021-03-18 LAB — MAGNESIUM: Magnesium: 1.7 mg/dL (ref 1.7–2.4)

## 2021-03-18 LAB — GLUCOSE, CAPILLARY
Glucose-Capillary: 101 mg/dL — ABNORMAL HIGH (ref 70–99)
Glucose-Capillary: 109 mg/dL — ABNORMAL HIGH (ref 70–99)
Glucose-Capillary: 104 mg/dL — ABNORMAL HIGH (ref 70–99)
Glucose-Capillary: 117 mg/dL — ABNORMAL HIGH (ref 70–99)

## 2021-03-18 LAB — PROCALCITONIN: Procalcitonin: 0.26 ng/mL

## 2021-03-18 MED ORDER — HYDROMORPHONE HCL 1 MG/ML IJ SOLN
0.7500 mg | INTRAMUSCULAR | Status: DC | PRN
  Administered 2021-03-18 – 2021-03-28 (×57): 0.75 mg via INTRAVENOUS
  Filled 2021-03-18 (×61): qty 1

## 2021-03-18 MED ORDER — MIDAZOLAM HCL 2 MG/2ML IJ SOLN
INTRAMUSCULAR | Status: AC
  Filled 2021-03-18: qty 2

## 2021-03-18 MED ORDER — LIDOCAINE HCL (PF) 1 % IJ SOLN
INTRAMUSCULAR | Status: AC
  Filled 2021-03-18: qty 30

## 2021-03-18 MED ORDER — FENTANYL CITRATE (PF) 100 MCG/2ML IJ SOLN
INTRAMUSCULAR | Status: AC
  Filled 2021-03-18: qty 4

## 2021-03-18 MED ORDER — FENTANYL CITRATE (PF) 100 MCG/2ML IJ SOLN
INTRAMUSCULAR | Status: AC | PRN
  Administered 2021-03-18 (×2): 25 ug via INTRAVENOUS
  Administered 2021-03-18 (×3): 50 ug via INTRAVENOUS

## 2021-03-18 MED ORDER — MIDAZOLAM HCL 2 MG/2ML IJ SOLN
INTRAMUSCULAR | Status: AC | PRN
  Administered 2021-03-18 (×4): 1 mg via INTRAVENOUS

## 2021-03-18 NOTE — Consult Note (Signed)
[address]       [place] [address]             [phone]                    [name] [facility] Medical Record #[id number] Date of Birth: 10/12/1951  Referring: No ref. provider found Primary Care: [name], [name], MD Primary Cardiologist: Elouise Munroe, MD  Chief Complaint:    Chief Complaint  Patient presents with  . Rib Injury    History of Present Illness:    Brian Malone 70 y.o. male admitted with a large right chest wall abscess after sustsain a fall on 3/6 where he had multiple rib fractures.  It was originally treated with drains, but one was removed inadvertently by the patient, and he subsequently re-accumulated more fluid on CT.    Past Medical History:  Diagnosis Date  . Acute subdural hematoma ([place]) 10/28/2019  . Anxiety   . Cellulitis of right upper extremity 11/04/2020  . Diabetes mellitus without complication ([place])   . History of COVID-19 05/26/2020  . HIV (human immunodeficiency virus infection) ([place])   . Hypertension   . Renal disorder     Past Surgical History:  Procedure Laterality Date  . ANKLE ARTHROSCOPY    . APPENDECTOMY    . BACK SURGERY     FUSION  . IR US GUIDE BX ASP/DRAIN  03/14/2021  . IR US GUIDE BX ASP/DRAIN  03/14/2021  . PENILE PROSTHESIS  REMOVAL  05/2020   Removal due to abscess  . PENILE PROSTHESIS PLACEMENT  04/2020  . THORACOTOMY Right 2008  ?  . TONSILLECTOMY      Family History  Problem Relation Age of Onset  . CAD Mother   . Lung cancer Mother   . CAD Father   . Lung cancer Father   . CAD Brother      Social History   Tobacco Use  Smoking Status Never Smoker  Smokeless Tobacco Never Used    Social History   Substance and Sexual Activity  Alcohol Use Not Currently     Allergies  Allergen Reactions  . Adhesive [Tape] Other (See Comments)    "Tape will take off my skin, as will Band-Aids"  . Cyclobenzaprine Other (See Comments)    Hallucinations  .  Duloxetine Hcl Other (See Comments)    Makes PTSD- induced nightmares more vivid   . Morphine Itching and Other (See Comments)    Hallucinations, aggression, and makes patient altered also      Current Facility-Administered Medications  Medication Dose Route Frequency Provider Last Rate Last Admin  . 0.9 %  sodium chloride infusion   Intravenous PRN Shalhoub, Sherryll Burger, MD   Stopped at 03/13/21 2106  . abacavir-dolutegravir-lamiVUDine (TRIUMEQ) 600-50-300 MG per tablet 1 tablet  1 tablet Oral Daily Shalhoub, Sherryll Burger, MD   1 tablet at 03/18/21 0857  . acetaminophen (TYLENOL) tablet 650 mg  650 mg Oral Q6H PRN Vernelle Emerald, MD   650 mg at 03/15/21 2028  . antiseptic oral rinse (BIOTENE) solution 15 mL  15 mL Mouth Rinse PRN Thurnell Lose, MD      . busPIRone (BUSPAR) tablet 30 mg  30 mg Oral BID Vernelle Emerald, MD   30 mg at 03/18/21 0848  . clonazePAM (KLONOPIN) disintegrating tablet 0.5 mg  0.5 mg Oral TID Thurnell Lose, MD   0.5 mg at  03/18/21 0848  . ezetimibe (ZETIA) tablet 10 mg  10 mg Oral Daily Shalhoub, Sherryll Burger, MD   10 mg at 03/18/21 0848  . feeding supplement (GLUCERNA SHAKE) (GLUCERNA SHAKE) liquid 237 mL  237 mL Oral TID BM Thurnell Lose, MD   237 mL at 03/17/21 1952  . fentaNYL (SUBLIMAZE) 100 MCG/2ML injection           . fentaNYL (SUBLIMAZE) injection   Intravenous PRN Sandi Mariscal, MD   25 mcg at 03/18/21 [phone]  . [START ON 03/19/2021] heparin injection 5,000 Units  5,000 Units Subcutaneous Q8H Monia Sabal, PA-C      . HYDROcodone-acetaminophen (NORCO/VICODIN) 5-325 MG per tablet 1-2 tablet  1-2 tablet Oral Q6H PRN Thurnell Lose, MD   2 tablet at 03/18/21 0631  . HYDROmorphone (DILAUDID) injection 0.5 mg  0.5 mg Intravenous Q4H PRN Thurnell Lose, MD   0.5 mg at 03/18/21 0210  . insulin aspart (novoLOG) injection 0-15 Units  0-15 Units Subcutaneous TID AC & HS Shalhoub, Sherryll Burger, MD   2 Units at 03/17/21 2158  . isosorbide mononitrate (IMDUR) 24  hr tablet 30 mg  30 mg Oral Daily Thurnell Lose, MD   30 mg at 03/18/21 0848  . lidocaine (PF) (XYLOCAINE) 1 % injection           . midazolam (VERSED) 2 MG/2ML injection           . midazolam (VERSED) injection   Intravenous PRN Sandi Mariscal, MD   1 mg at 03/18/21 1021  . multivitamin with minerals tablet 1 tablet  1 tablet Oral Daily Thurnell Lose, MD   1 tablet at 03/18/21 0848  . mupirocin ointment (BACTROBAN) 2 % 1 application  1 application Nasal BID Shalhoub, Sherryll Burger, MD   1 application at 89/21/19 0850  . naloxone [facility]) injection 0.4 mg  0.4 mg Intravenous PRN Thurnell Lose, MD      . ondansetron [facility]) injection 4 mg  4 mg Intravenous Q6H PRN Shalhoub, Sherryll Burger, MD   4 mg at 03/15/21 2232  . pantoprazole (PROTONIX) EC tablet 40 mg  40 mg Oral QAC breakfast Shalhoub, Sherryll Burger, MD   40 mg at 03/18/21 0848  . polyethylene glycol (MIRALAX / GLYCOLAX) packet 17 g  17 g Oral Daily PRN Shalhoub, Sherryll Burger, MD      . sodium chloride flush (NS) 0.9 % injection 5 mL  5 mL Intracatheter Q8H Markus Daft, MD   5 mL at 03/18/21 [phone]  . tamsulosin (FLOMAX) capsule 0.4 mg  0.4 mg Oral QHS Shalhoub, Sherryll Burger, MD   0.4 mg at 03/17/21 2159  . tiZANidine (ZANAFLEX) tablet 2 mg  2 mg Oral QHS Thurnell Lose, MD   2 mg at 03/17/21 2159  . valACYclovir (VALTREX) tablet 1,000 mg  1,000 mg Oral TID PRN Thurnell Lose, MD      . vancomycin (VANCOREADY) IVPB 1500 mg/300 mL  1,500 mg Intravenous Q24H Thurnell Lose, MD   Stopped at 03/17/21 1522    Review of Systems  Constitutional: Positive for fever, malaise/fatigue and weight loss.  Cardiovascular: Positive for chest pain.  Musculoskeletal: Positive for falls and myalgias.    PHYSICAL EXAMINATION: BP (!) 147/90   Pulse 83   Temp 98.1 F (36.7 C) (Axillary)   Resp 16   Ht 6' (1.829 m)   Wt 85.2 kg   SpO2 99%   BMI 25.47 kg/m   Physical Exam  Constitutional:      Appearance: He is ill-appearing. He is not toxic-appearing or  diaphoretic.  Eyes:     Extraocular Movements: Extraocular movements intact.  Cardiovascular:     Rate and Rhythm: Normal rate.  Pulmonary:     Effort: Pulmonary effort is normal. No respiratory distress.  Chest:       Comments: 3 drains in place with purulent output Erythema and fluctuant area Abdominal:     General: Abdomen is flat. There is no distension.  Musculoskeletal:     Cervical back: Normal range of motion.  Neurological:     Mental Status: He is alert.      Diagnostic Studies & Laboratory data:     Recent Radiology Findings:   CT CHEST W CONTRAST  Result Date: 03/17/2021 CLINICAL DATA:  Infected right chest wall hematoma EXAM: CT CHEST WITH CONTRAST TECHNIQUE: Multidetector CT imaging of the chest was performed during intravenous contrast administration. CONTRAST:  75 mL OMNIPAQUE IOHEXOL 350 MG/ML SOLN COMPARISON:  03/13/2021 FINDINGS: Cardiovascular: Heart size within normal limits. Coronary artery calcifications seen throughout. No significant vascular abnormality identified. Mediastinum/Nodes: No enlarged mediastinal, hilar, or axillary lymph nodes. Lungs/Pleura: Trace bilateral pleural effusions with adjacent atelectasis. Upper Abdomen: No acute abnormality. Musculoskeletal: Previously seen right chest wall collection has decreased in size since prior examination, measuring approximately 7.4 x 7.1 x 3.7 cm compared to 10.1 x 9.1 x 5.7 cm on prior exam from 03/13/2021. The collection is somewhat bilobed. The inferior portion of the right chest wall collection has decreased to a greater degree than the superior lobulation. Greater amount of air within the collection likely due to presence of drains. Lucency and irregularity of the right sixth and seventh ribs, anterior segment again noted. IMPRESSION: 1. Interval decrease in size of bilobed right anterolateral chest wall fluid collection. The inferior lobulation is nearly completely resolved with some fat stranding and  subcutaneous emphysema remaining. The more superior lobulation has decreased in size since prior study from 03/13/2021. The overall size of the collections now measures 7.4 x 7.1 x 3.7 cm compared to 10.1 x 9.1 x 5.7 cm on 03/13/2021. 2. Lucency in the anterior segments of the right sixth and seventh ribs again seen. Findings are suspicious for osteomyelitis, however underlying pathologic fracture is also possible. Electronically Signed   By: Miachel Roux M.D.   On: 03/17/2021 14:05   CT Chest W Contrast  Result Date: 03/13/2021 CLINICAL DATA:  Follow-up right chest wall hematoma and fractures of the right sixth through eighth ribs. Diabetes. HIV. EXAM: CT CHEST WITH CONTRAST TECHNIQUE: Multidetector CT imaging of the chest was performed during intravenous contrast administration. CONTRAST:  197mL OMNIPAQUE IOHEXOL 300 MG/ML  SOLN COMPARISON:  03/09/2021 FINDINGS: Cardiovascular: Heart size remains normal. No pericardial fluid. Coronary artery calcification as seen previously. Aortic atherosclerotic calcification as seen previously. Mediastinum/Nodes: Normal Lungs/Pleura: Scarring/atelectasis at the lung bases left more than right unchanged since the previous study. Small areas of patchy density in the superior segment of the right lower lobe unchanged from the study of 4 days ago. No new or progressive pulmonary finding. No pneumothorax. Upper Abdomen: No upper abdominal injury or acute finding. Musculoskeletal: There is increasing size of the right chest wall collections, primarily centered around the right sixth and eighth anterolateral ribs. Those ribs show a pattern of irregular destruction that look more like pathologic fractures due to infection rather than traumatic fractures. Air bubbles are now present within the collections. There is mild extrapleural involvement in side  of the ribcage, without frank breakthrough into the pleural space. Most of the collections manifest external to the ribcage. No new  areas of involvement are seen. IMPRESSION: Increasing size of the right chest wall collections, primarily centered around the right sixth and eighth anterolateral ribs, with development of air bubbles. Those ribs show a pattern of irregular destruction that have progressed and look more like pathologic fractures due to infection rather than traumatic fractures. Air bubbles are now present within the collections and they probably represent abscesses. There is mild extrapleural involvement on the inner side of the ribcage, without frank breakthrough into the pleural space. The majority of the collections are on the outer side of the ribcage. No new areas of involvement are seen. Case discussed with Dr. Regenia Skeeter at 1419 hours. Aortic Atherosclerosis (ICD10-I70.0). Electronically Signed   By: Nelson Chimes M.D.   On: 03/13/2021 14:19   CT Chest W Contrast  Result Date: 03/09/2021 CLINICAL DATA:  Fall 3 weeks prior with right rib fractures and persistent chest pain. EXAM: CT CHEST WITH CONTRAST TECHNIQUE: Multidetector CT imaging of the chest was performed during intravenous contrast administration. CONTRAST:  52mL OMNIPAQUE IOHEXOL 300 MG/ML  SOLN COMPARISON:  Chest radiograph from earlier today. 11/22/2019 chest CT angiogram. FINDINGS: Cardiovascular: Normal heart size. No significant pericardial effusion/thickening. Three-vessel coronary atherosclerosis. Atherosclerotic nonaneurysmal thoracic aorta. Top-normal caliber main pulmonary artery (3.0 cm diameter). No central pulmonary emboli. Mediastinum/Nodes: No discrete thyroid nodules. Unremarkable esophagus. No pathologically enlarged axillary, mediastinal or hilar lymph nodes. Lungs/Pleura: No pneumothorax. No pleural effusion. No acute consolidative airspace disease, lung masses or significant pulmonary nodules. Mild platelike atelectasis in the anterior basilar left lower lobe. Mild patchy ground-glass opacity in peripheral posterior right mid lung. Upper  abdomen: No acute abnormality. Musculoskeletal: No aggressive appearing focal osseous lesions. Nondisplaced acute anterior right sixth, seventh and eighth rib fractures with surrounding chest wall hematoma measuring up to 8.1 x 4.9 cm in maximum axial dimensions at the level of the anterior right sixth rib fracture (series 2/image 114). Moderate thoracic spondylosis. IMPRESSION: 1. Nondisplaced acute anterior right sixth, seventh and eighth rib fractures with surrounding chest wall hematoma. No pneumothorax or hemothorax. No discrete bone lesions. Clinical follow-up advised to ensure resolution of the right chest wall hematoma. Any need for follow-up imaging should be based on clinical assessment. 2. Mild patchy ground-glass opacity in the peripheral posterior right mid lung, nonspecific, favor mild pulmonary contusion. 3. Mild platelike atelectasis at the left lung base. 4. Three-vessel coronary atherosclerosis. 5. Aortic Atherosclerosis (ICD10-I70.0). Electronically Signed   By: Ilona Sorrel M.D.   On: 03/09/2021 13:03   IR US Guide Bx Asp/Drain  Result Date: 03/14/2021 INDICATION: 70 year old with history of fall and right rib fractures. Patient developed right chest hematomas. Patient is complaining of pain and recent CT demonstrates gas within the hematomas. Findings are concerning for infected hematomas. EXAM: PLACEMENT OF SUPERFICIAL RIGHT ANTERIOR CHEST WALL DRAIN USING ULTRASOUND GUIDANCE x 2 MEDICATIONS: Moderate sedation ANESTHESIA/SEDATION: Fentanyl 100 mcg IV; Versed 2.0 mg IV Moderate Sedation Time:  29 minutes The patient was continuously monitored during the procedure by the interventional radiology nurse under my direct supervision. COMPLICATIONS: None immediate. PROCEDURE: Informed written consent was obtained from the patient after a thorough discussion of the procedural risks, benefits and alternatives. All questions were addressed. Maximal Sterile Barrier Technique was utilized including  caps, mask, sterile gowns, sterile gloves, sterile drape, hand hygiene and skin antiseptic. A timeout was performed prior to the initiation of the procedure.  The right anterior chest wall hematomas were evaluated with ultrasound. Complex collections containing a small amount of fluid were obtained. The right anterior chest was prepped with chlorhexidine and sterile field was created. Attention was initially directed to the more inferior or caudal anterior chest wall collection. Skin was anesthetized with 1% lidocaine. Using ultrasound guidance, an 18 gauge trocar needle was directed into the collection and pink purulent fluid was aspirated. Superstiff Amplatz wire was advanced into the complex collection and the tract was dilated to accommodate a 10 [place] drain. Catheter was sutured to the skin and attached to a suction bulb. A sample of fluid was sent for culture. A second area more cephalad was targeted with ultrasound guidance. Skin was anesthetized with 1% lidocaine. Using ultrasound guidance, an 18 gauge trocar needle was directed into the complex collection. Again, purulent fluid was aspirated. Superstiff Amplatz wire was advanced into this collection and a 10 French drain was placed. Additional purulent fluid was aspirated and the catheter was sutured to skin and attached to a suction bulb. Dressings were placed over both drains. FINDINGS: Patient has palpable hematomas in the right anterior chest and upper abdomen region. Ultrasound demonstrates heterogeneous collections in both areas containing a small amount of compressible fluid. There are echogenic areas within the collections compatible with known gas. 10 French drains were placed in both areas and purulent fluid was draining from both collections. Both collections are very complex and compatible with infected hematomas. IMPRESSION: Ultrasound-guided placement of 2 percutaneous drains within the superficial right anterior chest wall collections.  Findings are compatible with infected hematomas. Fluid sample was sent for culture. Electronically Signed   By: Markus Daft M.D.   On: 03/14/2021 18:37   IR US Guide Bx Asp/Drain  Result Date: 03/14/2021 INDICATION: 70 year old with history of fall and right rib fractures. Patient developed right chest hematomas. Patient is complaining of pain and recent CT demonstrates gas within the hematomas. Findings are concerning for infected hematomas. EXAM: PLACEMENT OF SUPERFICIAL RIGHT ANTERIOR CHEST WALL DRAIN USING ULTRASOUND GUIDANCE x 2 MEDICATIONS: Moderate sedation ANESTHESIA/SEDATION: Fentanyl 100 mcg IV; Versed 2.0 mg IV Moderate Sedation Time:  29 minutes The patient was continuously monitored during the procedure by the interventional radiology nurse under my direct supervision. COMPLICATIONS: None immediate. PROCEDURE: Informed written consent was obtained from the patient after a thorough discussion of the procedural risks, benefits and alternatives. All questions were addressed. Maximal Sterile Barrier Technique was utilized including caps, mask, sterile gowns, sterile gloves, sterile drape, hand hygiene and skin antiseptic. A timeout was performed prior to the initiation of the procedure. The right anterior chest wall hematomas were evaluated with ultrasound. Complex collections containing a small amount of fluid were obtained. The right anterior chest was prepped with chlorhexidine and sterile field was created. Attention was initially directed to the more inferior or caudal anterior chest wall collection. Skin was anesthetized with 1% lidocaine. Using ultrasound guidance, an 18 gauge trocar needle was directed into the collection and pink purulent fluid was aspirated. Superstiff Amplatz wire was advanced into the complex collection and the tract was dilated to accommodate a 10 [place] drain. Catheter was sutured to the skin and attached to a suction bulb. A sample of fluid was sent for culture. A second  area more cephalad was targeted with ultrasound guidance. Skin was anesthetized with 1% lidocaine. Using ultrasound guidance, an 18 gauge trocar needle was directed into the complex collection. Again, purulent fluid was aspirated. Superstiff Amplatz wire was advanced into this  collection and a 10 [place] drain was placed. Additional purulent fluid was aspirated and the catheter was sutured to skin and attached to a suction bulb. Dressings were placed over both drains. FINDINGS: Patient has palpable hematomas in the right anterior chest and upper abdomen region. Ultrasound demonstrates heterogeneous collections in both areas containing a small amount of compressible fluid. There are echogenic areas within the collections compatible with known gas. 10 French drains were placed in both areas and purulent fluid was draining from both collections. Both collections are very complex and compatible with infected hematomas. IMPRESSION: Ultrasound-guided placement of 2 percutaneous drains within the superficial right anterior chest wall collections. Findings are compatible with infected hematomas. Fluid sample was sent for culture. Electronically Signed   By: Markus Daft M.D.   On: 03/14/2021 18:37   DG Chest Port 1 View  Result Date: 03/13/2021 CLINICAL DATA:  Sepsis, right rib pain. EXAM: PORTABLE CHEST 1 VIEW COMPARISON:  March 09, 2021. FINDINGS: The heart size and mediastinal contours are within normal limits. Both lungs are clear. No pneumothorax or pleural effusion is noted. The visualized skeletal structures are unremarkable. IMPRESSION: No active disease. Electronically Signed   By: Marijo Conception M.D.   On: 03/13/2021 14:11   DG Chest Portable 1 View  Result Date: 03/09/2021 CLINICAL DATA:  Chest pain. Chest [place] hurting late yesterday. Complains of right-sided sternal pain and chest pain. EXAM: PORTABLE CHEST 1 VIEW COMPARISON:  None. FINDINGS: The heart size and mediastinal contours are within normal  limits. Both lungs are clear. Blunting of the left costophrenic angle is noted, new from prior studies. The visualized skeletal structures are unremarkable. IMPRESSION: New blunting of left costophrenic angle may reflect small effusion. The lungs are otherwise clear. No signs of interstitial edema. Electronically Signed   By: Kerby Moors M.D.   On: 03/09/2021 11:26       I have independently reviewed the above radiology studies  and reviewed the findings with the patient.   Recent Lab Findings: Lab Results  Component Value Date   WBC 8.0 03/18/2021   HGB 10.2 (L) 03/18/2021   HCT 29.8 (L) 03/18/2021   PLT 206 03/18/2021   GLUCOSE 104 (H) 03/18/2021   CHOL 149 11/22/2019   TRIG 133 02/12/2020   HDL 25 (L) 11/22/2019   LDLCALC 47 11/22/2019   ALT 8 03/18/2021   AST 13 (L) 03/18/2021   NA 133 (L) 03/18/2021   K 3.8 03/18/2021   CL 100 03/18/2021   CREATININE 1.10 03/18/2021   BUN 10 03/18/2021   CO2 26 03/18/2021   TSH 5.191 (H) 04/16/2019   INR 1.3 (H) 03/14/2021   HGBA1C 8.3 (H) 03/14/2021         Assessment / Plan:   70 yo male with multiple medical problems including HIV and Hep C, is admitted with infected chest wall abscess that has failed percutaneous drainage.  OR tomorrow for incision and drainage, with wound vac placement.      Lajuana Matte 03/18/2021 10:23 AM

## 2021-03-18 NOTE — Progress Notes (Signed)
PT Cancellation Note  Patient Details Name: Brian Malone MRN: [id number] DOB: 11/30/1951   Cancelled Treatment:    Reason Eval/Treat Not Completed: (P) Pain limiting ability to participate Pt reports he just returned from placement of chest drains and is in too much pain to participate in PT. Pt request PT follow back this afternoon. PT will follow back as able this afternoon.  Audriana Aldama B. Migdalia Dk PT, DPT Acute Rehabilitation Services Pager [phone] Office [phone]    [place] 03/18/2021, 11:44 AM

## 2021-03-18 NOTE — Progress Notes (Signed)
OT Cancellation Note  Patient Details Name: ELVAN EBRON MRN: [id number] DOB: 1950-12-27   Cancelled Treatment:    Reason Eval/Treat Not Completed: Patient at procedure or test/ unavailable. OT to check back as time allow.   Gayatri Teasdale H., OTR/L Acute Rehabilitation  Xianna Siverling Elane Yolanda Bonine 03/18/2021, 9:53 AM

## 2021-03-18 NOTE — Progress Notes (Signed)
PROGRESS NOTE                                                                                                                                                                                                             Patient Demographics:    Brian Malone, is a 70 y.o. male, DOB - 28-Feb-1951, UDJ:[id number]  Outpatient Primary MD for the patient is Brian Malone, [name], MD    LOS - 5  Admit date - 03/13/2021    Chief Complaint  Patient presents with  . Rib Injury       Brief Narrative (HPI from H&P)  70 year old male with past medical history of hepatitis C, hypertension, hyperlipidemia, diabetes mellitus type 2, chronic pain syndrome, intravenous drug use (abstinent since 2019), major depressive disorder and HIV who had a trip and fall in his yard on 02/22/2021 after which he hurt his right-sided chest wall, at that time he was diagnosed with right-sided rib cage fracture and injury, he was seen in the ER a few times since then and was treated conservatively, his pain and discomfort continued to get worse and he presented again to [facility] on  03/13/2021 CT scan suggested that he had right chest wall abscess formation and cellulitis with concerns for sepsis.   Subjective:   Patient in bed, appears comfortable but wants more narcotics, denies any headache, no fever, no chest pain or pressure, no shortness of breath , no abdominal pain. No focal weakness.   Assessment  & Plan :     1. Sepsis due to right-sided chest wall abscess formation after mechanical fall and multiple right rib fractures sustained on 02/22/2021 - he being treated with empiric IV antibiotics along with IV fluids, currently sepsis pathophysiology has improved, Dr. Kipp Brood cardiothoracic surgery was consulted in the ER who suggested ultrasound-guided drainage of the abscess by IR, this was done by IR on 03/14/2021,  infected hematoma/abscess which was drained, initially had 2 drains placed by IR on 03/15/2021, patient pulled out 1 of 2 drains 03/16/2021, repeat CT scan shows growing abscess IR likely placing a second drain again on 03/18/2021.  There is also concern for acute osteomyelitis, will discuss with Dr. Kipp Brood at [facility] on 03/18/2021.  He has been following the patient remotely.  He is exhibiting narcotic seeking behavior, has been warned multiple times  as this can lead to accidental overdose and even death friend in the room on 2021/04/08 also made aware of the concerns.  IR following and doing another CT scan on 03/17/2021.  Kindly see nursing documentation as well.  Encouraged the patient to sit up in chair in the daytime use I-S and flutter valve for pulmonary toiletry.  Will advance activity and titrate down oxygen as possible.  SpO2: 98 % O2 Flow Rate (L/min): 2 L/min   2.  Chronic pain and narcotic use.  Supportive care as needed for acute discomfort, avoid overuse, as needed Narcan added.  Patient counseled see above.  3.  Continue home regimen follows with [facility] ID department.  4.  Anxiety and depression.  Home medications continued.  Currently not homicidal or suicidal.  5.  GERD.  On PPI.  6.  BPH.  On Flomax.  7.  AKI.  Improved after IV fluids likely due to ATN from sepsis.  8.  Hypertension.  Blood pressure soft, skipped today's blood pressure medications will monitor closely.   9.  DM type II.  On sliding scale monitor and adjust  Lab Results  Component Value Date   HGBA1C 8.3 (H) 03/14/2021   CBG (last 3)  Recent Labs    03/17/21 1614 03/17/21 2118 03/18/21 0833  GLUCAP 116* 123* 101*          Condition - Extremely Guarded  Family Communication  : None present bedside  Code Status :  Full  Consults  :  IR  PUD Prophylaxis : PPI   Procedures  :     US guided chest wall abscess drined by IR 03/14/21   CT -  Increasing size of the right chest wall  collections, primarily centered around the right sixth and eighth anterolateral ribs, with development of air bubbles. Those ribs show a pattern of irregular destruction that have progressed and look more like pathologic fractures due to infection rather than traumatic fractures. Air bubbles are now present within the collections and they probably represent abscesses. There is mild extrapleural involvement on the inner side of the ribcage, without frank breakthrough into the pleural space. The majority of the collections are on the outer side of the ribcage. No new areas of involvement are seen.  CT repeat 03/17/21 - 1. Interval decrease in size of bilobed right anterolateral chest wall fluid collection. The inferior lobulation is nearly completely resolved with some fat stranding and subcutaneous emphysema remaining. The more superior lobulation has decreased in size since prior study from 03/13/2021. The overall size of the collections now measures 7.4 x 7.1 x 3.7 cm compared to 10.1 x 9.1 x 5.7 cm on 03/13/2021. 2. Lucency in the anterior segments of the right sixth and seventh ribs again seen. Findings are suspicious for osteomyelitis, however underlying pathologic fracture is also possible.      Disposition Plan  :    Status is: Inpatient  Remains inpatient appropriate because:IV treatments appropriate due to intensity of illness or inability to take PO   Dispo: The patient is from: Home              Anticipated d/c is to: Home              Patient currently is not medically stable to d/c.   Difficult to place patient No  DVT Prophylaxis  :   Heparin   Lab Results  Component Value Date   PLT 206 03/18/2021    Diet :  Diet Order            Diet NPO time specified Except for: Sips with Meds  Diet effective midnight                  Inpatient Medications  Scheduled Meds: . abacavir-dolutegravir-lamiVUDine  1 tablet Oral Daily  . busPIRone  30 mg Oral BID  . clonazepam  0.5 mg  Oral TID  . ezetimibe  10 mg Oral Daily  . feeding supplement (GLUCERNA SHAKE)  237 mL Oral TID BM  . fentaNYL      . [START ON 03/19/2021] heparin injection (subcutaneous)  5,000 Units Subcutaneous Q8H  . insulin aspart  0-15 Units Subcutaneous TID AC & HS  . isosorbide mononitrate  30 mg Oral Daily  . lidocaine (PF)      . midazolam      . multivitamin with minerals  1 tablet Oral Daily  . mupirocin ointment  1 application Nasal BID  . pantoprazole  40 mg Oral QAC breakfast  . sodium chloride flush  5 mL Intracatheter Q8H  . tamsulosin  0.4 mg Oral QHS  . tiZANidine  2 mg Oral QHS   Continuous Infusions: . sodium chloride Stopped (03/13/21 2106)  . vancomycin Stopped (03/17/21 1522)   PRN Meds:.sodium chloride, acetaminophen **OR** [DISCONTINUED] acetaminophen, antiseptic oral rinse, fentaNYL, HYDROcodone-acetaminophen, HYDROmorphone (DILAUDID) injection **OR** [DISCONTINUED]  HYDROmorphone (DILAUDID) injection, naLOXone (NARCAN)  injection, [DISCONTINUED] ondansetron **OR** ondansetron (ZOFRAN) IV, polyethylene glycol, valACYclovir  Antibiotics  :    Anti-infectives (From admission, onward)   Start     Dose/Rate Route Frequency Ordered Stop   03/15/21 1200  vancomycin (VANCOREADY) IVPB 1500 mg/300 mL        1,500 mg 150 mL/hr over 120 Minutes Intravenous Every 24 hours 03/15/21 0732     03/14/21 1200  vancomycin (VANCOREADY) IVPB 1250 mg/250 mL  Status:  Discontinued        1,250 mg 166.7 mL/hr over 90 Minutes Intravenous Every 24 hours 03/13/21 1359 03/15/21 0732   03/14/21 1023  valACYclovir (VALTREX) tablet 1,000 mg       Note to Pharmacy: Trig neuralgia flare     1,000 mg Oral 3 times daily PRN 03/14/21 1023     03/14/21 1000  abacavir-dolutegravir-lamiVUDine (TRIUMEQ) 600-50-300 MG per tablet 1 tablet        1 tablet Oral Daily 03/13/21 2320     03/14/21 0200  meropenem (MERREM) 1 g in sodium chloride 0.9 % 100 mL IVPB  Status:  Discontinued        1 g 200 mL/hr over 30  Minutes Intravenous Every 8 hours 03/13/21 2337 03/16/21 0719   03/14/21 0100  ceFEPIme (MAXIPIME) 2 g in sodium chloride 0.9 % 100 mL IVPB  Status:  Discontinued       Note to Pharmacy: Cefepime 2 g IV q12h for CrCl < 60 mL/min   2 g 200 mL/hr over 30 Minutes Intravenous Every 12 hours 03/13/21 2326 03/13/21 2331   03/13/21 2000  piperacillin-tazobactam (ZOSYN) IVPB 3.375 g  Status:  Discontinued        3.375 g 12.5 mL/hr over 240 Minutes Intravenous Every 8 hours 03/13/21 1402 03/13/21 2320   03/13/21 1430  clindamycin (CLEOCIN) IVPB 900 mg        900 mg 100 mL/hr over 30 Minutes Intravenous  Once 03/13/21 1418 03/13/21 1548   03/13/21 1230  vancomycin (VANCOCIN) IVPB 1000 mg/200 mL premix        1,000  mg 200 mL/hr over 60 Minutes Intravenous  Once 03/13/21 1229 03/13/21 1506   03/13/21 1230  piperacillin-tazobactam (ZOSYN) IVPB 3.375 g        3.375 g 100 mL/hr over 30 Minutes Intravenous  Once 03/13/21 1229 03/13/21 1400       Time Spent in minutes  30   Lala Lund M.D on 03/18/2021 at 10:05 AM  To page go to [email or web address]   [facility] -  Office  [phone]    See all Orders from today for further details    Objective:   Vitals:   03/17/21 1414 03/17/21 2116 03/18/21 0410 03/18/21 0955  BP: 134/77 (!) 157/88 137/82 (!) 149/86  Pulse: 89 80 63 86  Resp: 18 16 18 20   Temp: 98.2 F (36.8 C) 98.1 F (36.7 C) 98.1 F (36.7 C)   TempSrc: Oral Axillary Axillary   SpO2: 98% 97% 96% 98%  Weight:      Height:        Wt Readings from Last 3 Encounters:  03/16/21 85.2 kg  03/09/21 77.1 kg  01/22/21 72.6 kg     Intake/Output Summary (Last 24 hours) at 03/18/2021 1005 Last data filed at 03/18/2021 3016 Gross per 24 hour  Intake 840 ml  Output 2790 ml  Net -1950 ml   Physical Exam  Awake Alert, No new F.N deficits, Normal affect [place].AT,PERRAL Supple Neck,No JVD, No cervical lymphadenopathy appriciated.  Symmetrical Chest wall movement, Good air  movement bilaterally, CTAB RRR,No Gallops, Rubs or new Murmurs, No Parasternal Heave +ve B.Sounds, Abd Soft, No tenderness, No organomegaly appriciated, No rebound - guarding or rigidity. No Cyanosis, Clubbing or edema, No new Rash or bruise   L chest wall below on 03/14/20           Data Review:    CBC Recent Labs  Lab 03/14/21 0038 03/15/21 0220 03/16/21 0226 03/17/21 0116 03/18/21 0151  WBC 15.9* 12.2* 11.7* 9.2 8.0  HGB 10.0* 9.7* 10.2* 10.0* 10.2*  HCT 29.8* 29.8* 30.9* 29.6* 29.8*  PLT 219 209 219 232 206  MCV 98.0 98.3 98.4 96.4 96.1  MCH 32.9 32.0 32.5 32.6 32.9  MCHC 33.6 32.6 33.0 33.8 34.2  RDW 12.8 13.2 13.0 13.0 12.9  LYMPHSABS 0.4* 0.5* 0.8 0.9 1.1  MONOABS 0.7 0.9 1.2* 1.1* 1.0  EOSABS 0.1 0.1 0.1 0.1 0.1  BASOSABS 0.0 0.0 0.0 0.0 0.0    Recent Labs  Lab 03/13/21 1320 03/13/21 1615 03/14/21 0038 03/15/21 0220 03/16/21 0226 03/17/21 0116 03/18/21 0151  NA 131*  --  134* 134* 134* 134* 133*  K 3.5  --  3.5 3.7 3.2* 3.5 3.8  CL 95*  --  99 100 102 102 100  CO2 25  --  28 26 26 24 26   GLUCOSE 327*  --  157* 101* 130* 198* 104*  BUN 32*  --  24* 17 17 14 10   CREATININE 1.56*  --  1.21 1.24 1.20 1.15 1.10  CALCIUM 7.9*  --  8.1* 8.0* 7.8* 7.7* 8.1*  AST 10*  --  10* 9* 12* 12* 13*  ALT 8  --  8 7 7 6 8   ALKPHOS 91  --  82 98 93 98 82  BILITOT 0.7  --  1.1 1.0 0.8 0.6 0.6  ALBUMIN 2.2*  --  1.9* 1.7* 1.6* 1.6* 1.7*  MG  --   --  1.4* 1.8 1.7 1.6* 1.7  PROCALCITON  --   --  0.56 1.00 0.68 0.35  0.26  LATICACIDVEN 1.9 2.4* 1.8  --   --   --   --   INR 1.3*  --  1.3*  --   --   --   --   HGBA1C  --   --  8.3*  --   --   --   --     ------------------------------------------------------------------------------------------------------------------ No results for input(s): CHOL, HDL, LDLCALC, TRIG, CHOLHDL, LDLDIRECT in the last 72 hours.  Lab Results  Component Value Date   HGBA1C 8.3 (H) 03/14/2021    ------------------------------------------------------------------------------------------------------------------ No results for input(s): TSH, T4TOTAL, T3FREE, THYROIDAB in the last 72 hours.  Invalid input(s): FREET3  Cardiac Enzymes No results for input(s): CKMB, TROPONINI, MYOGLOBIN in the last 168 hours.  Invalid input(s): CK ------------------------------------------------------------------------------------------------------------------ No results found for: BNP  Micro Results Recent Results (from the past 240 hour(s))  Urine culture     Status: None   Collection Time: 03/13/21 12:25 AM   Specimen: In/Out Cath Urine  Result Value Ref Range Status   Specimen Description IN/OUT CATH URINE  Final   Special Requests NONE  Final   Culture   Final    NO GROWTH Performed at [facility], [address] [place], [place] [address]    Report Status 03/15/2021 FINAL  Final  Blood Culture (routine x 2)     Status: None   Collection Time: 03/13/21 12:25 PM   Specimen: Right Antecubital; Blood  Result Value Ref Range Status   Specimen Description   Final    RIGHT ANTECUBITAL BLOOD Performed at [facility], [address] [place], [place] [address]    Special Requests   Final    BOTTLES DRAWN AEROBIC AND ANAEROBIC Blood Culture results may not be optimal due to an inadequate volume of blood received in culture bottles Performed at [facility], [address] [place], [place] [address]    Culture   Final    NO GROWTH 5 DAYS Performed at [facility], [place] [address], [place], [place] [address]    Report Status 03/18/2021 FINAL  Final  Blood Culture (routine x 2)     Status: None   Collection Time: 03/13/21  1:15 PM   Specimen: BLOOD RIGHT HAND  Result Value Ref Range Status   Specimen Description   Final    BLOOD RIGHT HAND BLOOD Performed at [facility], [address] [place], [place] [address]    Special Requests    Final    BOTTLES DRAWN AEROBIC AND ANAEROBIC Blood Culture results may not be optimal due to an inadequate volume of blood received in culture bottles Performed at [facility], [address] [place], [place] [address]    Culture   Final    NO GROWTH 5 DAYS Performed at [facility], [place] [address] [place], [place] [address]    Report Status 03/18/2021 FINAL  Final  Resp Panel by RT-PCR (Flu A&B, Covid) Nasopharyngeal Swab     Status: None   Collection Time: 03/13/21  2:01 PM   Specimen: Nasopharyngeal Swab; Nasopharyngeal(NP) swabs in vial transport medium  Result Value Ref Range Status   SARS Coronavirus 2 by RT PCR NEGATIVE NEGATIVE Final    Comment: (NOTE) SARS-CoV-2 target nucleic acids are NOT DETECTED.  The SARS-CoV-2 RNA is generally detectable in upper respiratory specimens during the acute phase of infection. The lowest concentration of SARS-CoV-2 viral copies this assay  can detect is 138 copies/mL. A negative result does not preclude SARS-Cov-2 infection and should not be used as the sole basis for treatment or other patient management decisions. A negative result may occur with  improper specimen collection/handling, submission of specimen other than nasopharyngeal swab, presence of viral mutation(s) within the areas targeted by this assay, and inadequate number of viral copies(<138 copies/mL). A negative result must be combined with clinical observations, patient history, and epidemiological information. The expected result is Negative.  Fact Sheet for Patients:  EntrepreneurPulse.com.au  Fact Sheet for Healthcare Providers:  IncredibleEmployment.be  This test is no t yet approved or cleared by the [place] FDA and  has been authorized for detection and/or diagnosis of SARS-CoV-2 by FDA under an Emergency Use Authorization (EUA). This EUA will remain  in effect (meaning this test can be used) for the  duration of the COVID-19 declaration under Section 564(b)(1) of the Act, 21 U.S.C.section 360bbb-3(b)(1), unless the authorization is terminated  or revoked sooner.       Influenza A by PCR NEGATIVE NEGATIVE Final   Influenza B by PCR NEGATIVE NEGATIVE Final    Comment: (NOTE) The Xpert Xpress SARS-CoV-2/FLU/RSV plus assay is intended as an aid in the diagnosis of influenza from Nasopharyngeal swab specimens and should not be used as a sole basis for treatment. Nasal washings and aspirates are unacceptable for Xpert Xpress SARS-CoV-2/FLU/RSV testing.  Fact Sheet for Patients: EntrepreneurPulse.com.au  Fact Sheet for Healthcare Providers: IncredibleEmployment.be  This test is not yet approved or cleared by the [place] FDA and has been authorized for detection and/or diagnosis of SARS-CoV-2 by FDA under an Emergency Use Authorization (EUA). This EUA will remain in effect (meaning this test can be used) for the duration of the COVID-19 declaration under Section 564(b)(1) of the Act, 21 U.S.C. section 360bbb-3(b)(1), unless the authorization is terminated or revoked.  Performed at [facility], [address] [place], [place] [address]   MRSA PCR Screening     Status: Abnormal   Collection Time: 03/13/21 11:51 PM   Specimen: Nasal Mucosa; Nasopharyngeal  Result Value Ref Range Status   MRSA by PCR POSITIVE (A) NEGATIVE Final    Comment:        The GeneXpert MRSA Assay (FDA approved for NASAL specimens only), is one component of a comprehensive MRSA colonization surveillance program. It is not intended to diagnose MRSA infection nor to guide or monitor treatment for MRSA infections. RESULT CALLED TO, READ BACK BY AND VERIFIED WITHDorna Bloom RN 03/14/21 0432 JDW Performed at [facility] [address], [place], [place] [address]   Aerobic/Anaerobic Culture (surgical/deep wound)     Status: None (Preliminary  result)   Collection Time: 03/14/21  1:23 PM   Specimen: Abscess  Result Value Ref Range Status   Specimen Description ABSCESS  Final   Special Requests NONE  Final   Gram Stain   Final    ABUNDANT WBC PRESENT, PREDOMINANTLY PMN ABUNDANT GRAM POSITIVE COCCI IN CLUSTERS Performed at [facility], [place] [address] [place], [place] [address]    Culture   Final    ABUNDANT METHICILLIN RESISTANT STAPHYLOCOCCUS AUREUS NO ANAEROBES ISOLATED; CULTURE IN PROGRESS FOR 5 DAYS    Report Status PENDING  Incomplete   Organism ID, Bacteria METHICILLIN RESISTANT STAPHYLOCOCCUS AUREUS  Final      Susceptibility   Methicillin resistant staphylococcus aureus - MIC*    CIPROFLOXACIN >=8 RESISTANT Resistant  ERYTHROMYCIN >=8 RESISTANT Resistant     GENTAMICIN <=0.5 SENSITIVE Sensitive     OXACILLIN >=4 RESISTANT Resistant     TETRACYCLINE <=1 SENSITIVE Sensitive     VANCOMYCIN <=0.5 SENSITIVE Sensitive     TRIMETH/SULFA >=320 RESISTANT Resistant     CLINDAMYCIN >=8 RESISTANT Resistant     RIFAMPIN <=0.5 SENSITIVE Sensitive     Inducible Clindamycin NEGATIVE Sensitive     * ABUNDANT METHICILLIN RESISTANT STAPHYLOCOCCUS AUREUS    Radiology Reports CT CHEST W CONTRAST  Result Date: 03/17/2021 CLINICAL DATA:  Infected right chest wall hematoma EXAM: CT CHEST WITH CONTRAST TECHNIQUE: Multidetector CT imaging of the chest was performed during intravenous contrast administration. CONTRAST:  75 mL OMNIPAQUE IOHEXOL 350 MG/ML SOLN COMPARISON:  03/13/2021 FINDINGS: Cardiovascular: Heart size within normal limits. Coronary artery calcifications seen throughout. No significant vascular abnormality identified. Mediastinum/Nodes: No enlarged mediastinal, hilar, or axillary lymph nodes. Lungs/Pleura: Trace bilateral pleural effusions with adjacent atelectasis. Upper Abdomen: No acute abnormality. Musculoskeletal: Previously seen right chest wall collection has decreased in size since prior examination,  measuring approximately 7.4 x 7.1 x 3.7 cm compared to 10.1 x 9.1 x 5.7 cm on prior exam from 03/13/2021. The collection is somewhat bilobed. The inferior portion of the right chest wall collection has decreased to a greater degree than the superior lobulation. Greater amount of air within the collection likely due to presence of drains. Lucency and irregularity of the right sixth and seventh ribs, anterior segment again noted. IMPRESSION: 1. Interval decrease in size of bilobed right anterolateral chest wall fluid collection. The inferior lobulation is nearly completely resolved with some fat stranding and subcutaneous emphysema remaining. The more superior lobulation has decreased in size since prior study from 03/13/2021. The overall size of the collections now measures 7.4 x 7.1 x 3.7 cm compared to 10.1 x 9.1 x 5.7 cm on 03/13/2021. 2. Lucency in the anterior segments of the right sixth and seventh ribs again seen. Findings are suspicious for osteomyelitis, however underlying pathologic fracture is also possible. Electronically Signed   By: Miachel Roux M.D.   On: 03/17/2021 14:05   CT Chest W Contrast  Result Date: 03/13/2021 CLINICAL DATA:  Follow-up right chest wall hematoma and fractures of the right sixth through eighth ribs. Diabetes. HIV. EXAM: CT CHEST WITH CONTRAST TECHNIQUE: Multidetector CT imaging of the chest was performed during intravenous contrast administration. CONTRAST:  176mL OMNIPAQUE IOHEXOL 300 MG/ML  SOLN COMPARISON:  03/09/2021 FINDINGS: Cardiovascular: Heart size remains normal. No pericardial fluid. Coronary artery calcification as seen previously. Aortic atherosclerotic calcification as seen previously. Mediastinum/Nodes: Normal Lungs/Pleura: Scarring/atelectasis at the lung bases left more than right unchanged since the previous study. Small areas of patchy density in the superior segment of the right lower lobe unchanged from the study of 4 days ago. No new or progressive  pulmonary finding. No pneumothorax. Upper Abdomen: No upper abdominal injury or acute finding. Musculoskeletal: There is increasing size of the right chest wall collections, primarily centered around the right sixth and eighth anterolateral ribs. Those ribs show a pattern of irregular destruction that look more like pathologic fractures due to infection rather than traumatic fractures. Air bubbles are now present within the collections. There is mild extrapleural involvement in side of the ribcage, without frank breakthrough into the pleural space. Most of the collections manifest external to the ribcage. No new areas of involvement are seen. IMPRESSION: Increasing size of the right chest wall collections, primarily centered around the right sixth and eighth anterolateral  ribs, with development of air bubbles. Those ribs show a pattern of irregular destruction that have progressed and look more like pathologic fractures due to infection rather than traumatic fractures. Air bubbles are now present within the collections and they probably represent abscesses. There is mild extrapleural involvement on the inner side of the ribcage, without frank breakthrough into the pleural space. The majority of the collections are on the outer side of the ribcage. No new areas of involvement are seen. Case discussed with Dr. Regenia Skeeter at 1419 hours. Aortic Atherosclerosis (ICD10-I70.0). Electronically Signed   By: Nelson Chimes M.D.   On: 03/13/2021 14:19   CT Chest W Contrast  Result Date: 03/09/2021 CLINICAL DATA:  Fall 3 weeks prior with right rib fractures and persistent chest pain. EXAM: CT CHEST WITH CONTRAST TECHNIQUE: Multidetector CT imaging of the chest was performed during intravenous contrast administration. CONTRAST:  84mL OMNIPAQUE IOHEXOL 300 MG/ML  SOLN COMPARISON:  Chest radiograph from earlier today. 11/22/2019 chest CT angiogram. FINDINGS: Cardiovascular: Normal heart size. No significant pericardial  effusion/thickening. Three-vessel coronary atherosclerosis. Atherosclerotic nonaneurysmal thoracic aorta. Top-normal caliber main pulmonary artery (3.0 cm diameter). No central pulmonary emboli. Mediastinum/Nodes: No discrete thyroid nodules. Unremarkable esophagus. No pathologically enlarged axillary, mediastinal or hilar lymph nodes. Lungs/Pleura: No pneumothorax. No pleural effusion. No acute consolidative airspace disease, lung masses or significant pulmonary nodules. Mild platelike atelectasis in the anterior basilar left lower lobe. Mild patchy ground-glass opacity in peripheral posterior right mid lung. Upper abdomen: No acute abnormality. Musculoskeletal: No aggressive appearing focal osseous lesions. Nondisplaced acute anterior right sixth, seventh and eighth rib fractures with surrounding chest wall hematoma measuring up to 8.1 x 4.9 cm in maximum axial dimensions at the level of the anterior right sixth rib fracture (series 2/image 114). Moderate thoracic spondylosis. IMPRESSION: 1. Nondisplaced acute anterior right sixth, seventh and eighth rib fractures with surrounding chest wall hematoma. No pneumothorax or hemothorax. No discrete bone lesions. Clinical follow-up advised to ensure resolution of the right chest wall hematoma. Any need for follow-up imaging should be based on clinical assessment. 2. Mild patchy ground-glass opacity in the peripheral posterior right mid lung, nonspecific, favor mild pulmonary contusion. 3. Mild platelike atelectasis at the left lung base. 4. Three-vessel coronary atherosclerosis. 5. Aortic Atherosclerosis (ICD10-I70.0). Electronically Signed   By: Ilona Sorrel M.D.   On: 03/09/2021 13:03   IR US Guide Bx Asp/Drain  Result Date: 03/14/2021 INDICATION: 70 year old with history of fall and right rib fractures. Patient developed right chest hematomas. Patient is complaining of pain and recent CT demonstrates gas within the hematomas. Findings are concerning for infected  hematomas. EXAM: PLACEMENT OF SUPERFICIAL RIGHT ANTERIOR CHEST WALL DRAIN USING ULTRASOUND GUIDANCE x 2 MEDICATIONS: Moderate sedation ANESTHESIA/SEDATION: Fentanyl 100 mcg IV; Versed 2.0 mg IV Moderate Sedation Time:  29 minutes The patient was continuously monitored during the procedure by the interventional radiology nurse under my direct supervision. COMPLICATIONS: None immediate. PROCEDURE: Informed written consent was obtained from the patient after a thorough discussion of the procedural risks, benefits and alternatives. All questions were addressed. Maximal Sterile Barrier Technique was utilized including caps, mask, sterile gowns, sterile gloves, sterile drape, hand hygiene and skin antiseptic. A timeout was performed prior to the initiation of the procedure. The right anterior chest wall hematomas were evaluated with ultrasound. Complex collections containing a small amount of fluid were obtained. The right anterior chest was prepped with chlorhexidine and sterile field was created. Attention was initially directed to the more inferior or caudal anterior chest  wall collection. Skin was anesthetized with 1% lidocaine. Using ultrasound guidance, an 18 gauge trocar needle was directed into the collection and pink purulent fluid was aspirated. Superstiff Amplatz wire was advanced into the complex collection and the tract was dilated to accommodate a 10 [place] drain. Catheter was sutured to the skin and attached to a suction bulb. A sample of fluid was sent for culture. A second area more cephalad was targeted with ultrasound guidance. Skin was anesthetized with 1% lidocaine. Using ultrasound guidance, an 18 gauge trocar needle was directed into the complex collection. Again, purulent fluid was aspirated. Superstiff Amplatz wire was advanced into this collection and a 10 French drain was placed. Additional purulent fluid was aspirated and the catheter was sutured to skin and attached to a suction bulb.  Dressings were placed over both drains. FINDINGS: Patient has palpable hematomas in the right anterior chest and upper abdomen region. Ultrasound demonstrates heterogeneous collections in both areas containing a small amount of compressible fluid. There are echogenic areas within the collections compatible with known gas. 10 French drains were placed in both areas and purulent fluid was draining from both collections. Both collections are very complex and compatible with infected hematomas. IMPRESSION: Ultrasound-guided placement of 2 percutaneous drains within the superficial right anterior chest wall collections. Findings are compatible with infected hematomas. Fluid sample was sent for culture. Electronically Signed   By: Markus Daft M.D.   On: 03/14/2021 18:37   IR US Guide Bx Asp/Drain  Result Date: 03/14/2021 INDICATION: 70 year old with history of fall and right rib fractures. Patient developed right chest hematomas. Patient is complaining of pain and recent CT demonstrates gas within the hematomas. Findings are concerning for infected hematomas. EXAM: PLACEMENT OF SUPERFICIAL RIGHT ANTERIOR CHEST WALL DRAIN USING ULTRASOUND GUIDANCE x 2 MEDICATIONS: Moderate sedation ANESTHESIA/SEDATION: Fentanyl 100 mcg IV; Versed 2.0 mg IV Moderate Sedation Time:  29 minutes The patient was continuously monitored during the procedure by the interventional radiology nurse under my direct supervision. COMPLICATIONS: None immediate. PROCEDURE: Informed written consent was obtained from the patient after a thorough discussion of the procedural risks, benefits and alternatives. All questions were addressed. Maximal Sterile Barrier Technique was utilized including caps, mask, sterile gowns, sterile gloves, sterile drape, hand hygiene and skin antiseptic. A timeout was performed prior to the initiation of the procedure. The right anterior chest wall hematomas were evaluated with ultrasound. Complex collections containing a  small amount of fluid were obtained. The right anterior chest was prepped with chlorhexidine and sterile field was created. Attention was initially directed to the more inferior or caudal anterior chest wall collection. Skin was anesthetized with 1% lidocaine. Using ultrasound guidance, an 18 gauge trocar needle was directed into the collection and pink purulent fluid was aspirated. Superstiff Amplatz wire was advanced into the complex collection and the tract was dilated to accommodate a 10 [place] drain. Catheter was sutured to the skin and attached to a suction bulb. A sample of fluid was sent for culture. A second area more cephalad was targeted with ultrasound guidance. Skin was anesthetized with 1% lidocaine. Using ultrasound guidance, an 18 gauge trocar needle was directed into the complex collection. Again, purulent fluid was aspirated. Superstiff Amplatz wire was advanced into this collection and a 10 French drain was placed. Additional purulent fluid was aspirated and the catheter was sutured to skin and attached to a suction bulb. Dressings were placed over both drains. FINDINGS: Patient has palpable hematomas in the right anterior chest and upper abdomen  region. Ultrasound demonstrates heterogeneous collections in both areas containing a small amount of compressible fluid. There are echogenic areas within the collections compatible with known gas. 10 French drains were placed in both areas and purulent fluid was draining from both collections. Both collections are very complex and compatible with infected hematomas. IMPRESSION: Ultrasound-guided placement of 2 percutaneous drains within the superficial right anterior chest wall collections. Findings are compatible with infected hematomas. Fluid sample was sent for culture. Electronically Signed   By: Markus Daft M.D.   On: 03/14/2021 18:37   DG Chest Port 1 View  Result Date: 03/13/2021 CLINICAL DATA:  Sepsis, right rib pain. EXAM: PORTABLE CHEST 1  VIEW COMPARISON:  March 09, 2021. FINDINGS: The heart size and mediastinal contours are within normal limits. Both lungs are clear. No pneumothorax or pleural effusion is noted. The visualized skeletal structures are unremarkable. IMPRESSION: No active disease. Electronically Signed   By: Marijo Conception M.D.   On: 03/13/2021 14:11   DG Chest Portable 1 View  Result Date: 03/09/2021 CLINICAL DATA:  Chest pain. Chest Arctic hurting late yesterday. Complains of right-sided sternal pain and chest pain. EXAM: PORTABLE CHEST 1 VIEW COMPARISON:  None. FINDINGS: The heart size and mediastinal contours are within normal limits. Both lungs are clear. Blunting of the left costophrenic angle is noted, new from prior studies. The visualized skeletal structures are unremarkable. IMPRESSION: New blunting of left costophrenic angle may reflect small effusion. The lungs are otherwise clear. No signs of interstitial edema. Electronically Signed   By: Kerby Moors M.D.   On: 03/09/2021 11:26

## 2021-03-18 NOTE — Progress Notes (Signed)
Pharmacy Antibiotic Note  Brian Malone is a 70 y.o. male on day #6 Vancomcyin for MRSA chest wall abscess/infected hematoma. S/p drain placement in IR 3/26, patient subsequently pulled one drain. Replacement of drain in IR today. Gram stain from today's same with abundat GPC in clusters. Planning I&D in [place] on 03/19/21.  Plan: Continue Vancomycin 1500 mg IV q24h. Daily dose due at 12n > would be okay to give pre-op on 3/31 (OR scheduled ~1030am) Will follow renal function, culture data and clinical progress. Vancomycin levels when appropriate.  Height: 6' (182.9 cm) Weight: 85.2 kg (187 lb 13.3 oz) IBW/kg (Calculated) : 77.6  Temp (24hrs), Avg:98.2 F (36.8 C), Min:98.1 F (36.7 C), Max:98.5 F (36.9 C)  Recent Labs  Lab 03/13/21 1320 03/13/21 1615 03/14/21 0038 03/15/21 0220 03/16/21 0226 03/17/21 0116 03/18/21 0151  WBC 18.1*  --  15.9* 12.2* 11.7* 9.2 8.0  CREATININE 1.56*  --  1.21 1.24 1.20 1.15 1.10  LATICACIDVEN 1.9 2.4* 1.8  --   --   --   --     Estimated Creatinine Clearance: 71.5 mL/min (by C-G formula based on SCr of 1.1 mg/dL).    Allergies  Allergen Reactions  . Adhesive [Tape] Other (See Comments)    "Tape will take off my skin, as will Band-Aids"  . Cyclobenzaprine Other (See Comments)    Hallucinations  . Duloxetine Hcl Other (See Comments)    Makes PTSD- induced nightmares more vivid   . Morphine Itching and Other (See Comments)    Hallucinations, aggression, and makes patient altered also      Antimicrobials this admission: Vancomycin 3/25>> Zosyn x 2 on 3/25 Meropenem 3/25>3/28 Clindamcyin x 1 3/25 CH/Bactroban 3/26>>3/30  Dose adjustments this admission:  3/27: empiric Vanc 1250 > 1500 mg q24h d/t renal fxn  Microbiology results: 3/25 MRSA PCR: positive 3/25 urine: neg 3/25 Blood x 2: negative 3/26 abscess: MRSA , MIC < 0.5 3/25 covid and flu: neg 3/30 abscess, chest wall drain: ab GPC in clusters  Thank you for allowing  pharmacy to be a part of this patient's care.  Arty Baumgartner, [place] 03/18/2021 5:01 PM

## 2021-03-18 NOTE — Procedures (Signed)
Pre procedural Dx: Chest wall abscess Post procedural Dx: Same  Technically successful US guided placed of a 10 Fr drainage catheters into complex right chest wall abscesses adjacent to known rib fractures. Approximately 30 cc of purulent fluid aspiration from the more medially positioned chest wall drain.  Approximately 10 cc aspirated from the more laterally positioned chest wall drain.  A representative sample of aspirated fluid was capped and sent to the lab for analysis.   Technically successful US guided aspiration of 3 cc of bloody hematoma from the lateral aspect of the right chest wall abscess.  Successful bedside expression of approximately 70 cc of infection of site of removed percutaneous drainage catheter.   EBL: Trace Complications: None immediate  Ronny Bacon, MD Pager #: [phone]

## 2021-03-18 NOTE — Progress Notes (Signed)
At midnight, pt c/o 10/10 rib cage pain. Told pt that I could bring Norco at [facility] when it was available. Pt requested dilaudid. He stated Norco did not touch his pain. I told pt that oral pain meds need to be utilized to help prepare pt for DC as pt will not have access to IV pain meds after DC. Pt agreed to take Norco at [facility].

## 2021-03-18 NOTE — Progress Notes (Signed)
[facility] night shift.   The staff reported that the patient's pain control is insufficient despite getting hydrocodone with acetaminophen and then hydromorphone 0.5 mg IVP earlier, which he is receiving every 4 hours as needed if oral analgesics not fully effective.  I have increased hydromorphone frequency to 0.75 mg every 3 hours.  Tennis Must, MD

## 2021-03-19 ENCOUNTER — Encounter (HOSPITAL_COMMUNITY)
Admission: EM | Disposition: A | Discharge status: Home Health | Payer: Self-pay | Source: Other Acute Inpatient Hospital | Attending: Internal Medicine

## 2021-03-19 ENCOUNTER — Inpatient Hospital Stay (HOSPITAL_COMMUNITY): Payer: Medicare Other | Admitting: Anesthesiology

## 2021-03-19 ENCOUNTER — Encounter (HOSPITAL_COMMUNITY): Payer: Self-pay | Admitting: Internal Medicine

## 2021-03-19 DIAGNOSIS — N179 Acute kidney failure, unspecified: Secondary | ICD-10-CM | POA: Diagnosis not present

## 2021-03-19 DIAGNOSIS — E43 Unspecified severe protein-calorie malnutrition: Secondary | ICD-10-CM | POA: Insufficient documentation

## 2021-03-19 DIAGNOSIS — R6520 Severe sepsis without septic shock: Secondary | ICD-10-CM | POA: Diagnosis not present

## 2021-03-19 DIAGNOSIS — A419 Sepsis, unspecified organism: Secondary | ICD-10-CM | POA: Diagnosis not present

## 2021-03-19 DIAGNOSIS — L02213 Cutaneous abscess of chest wall: Secondary | ICD-10-CM | POA: Diagnosis not present

## 2021-03-19 HISTORY — PX: INCISION AND DRAINAGE ABSCESS: SHX5864

## 2021-03-19 LAB — ABO/RH: ABO/RH(D): O POS

## 2021-03-19 LAB — COMPREHENSIVE METABOLIC PANEL
ALT: 8 U/L (ref 0–44)
AST: 13 U/L — ABNORMAL LOW (ref 15–41)
Albumin: 1.8 g/dL — ABNORMAL LOW (ref 3.5–5.0)
Alkaline Phosphatase: 90 U/L (ref 38–126)
Anion gap: 5 (ref 5–15)
BUN: 8 mg/dL (ref 8–23)
CO2: 29 mmol/L (ref 22–32)
Calcium: 8.2 mg/dL — ABNORMAL LOW (ref 8.9–10.3)
Chloride: 97 mmol/L — ABNORMAL LOW (ref 98–111)
Creatinine, Ser: 1.13 mg/dL (ref 0.61–1.24)
GFR, Estimated: >60 mL/min (ref >60)
Glucose, Bld: 124 mg/dL — ABNORMAL HIGH (ref 70–99)
Potassium: 4.4 mmol/L (ref 3.5–5.1)
Sodium: 131 mmol/L — ABNORMAL LOW (ref 135–145)
Total Bilirubin: 0.9 mg/dL (ref 0.3–1.2)
Total Protein: 5.9 g/dL — ABNORMAL LOW (ref 6.5–8.1)

## 2021-03-19 LAB — TYPE AND SCREEN
ABO/RH(D): O POS
Antibody Screen: NEGATIVE

## 2021-03-19 LAB — CBC WITH DIFFERENTIAL/PLATELET
Abs Immature Granulocytes: 0.06 10*3/uL (ref 0.00–0.07)
Basophils Absolute: 0 10*3/uL (ref 0.0–0.1)
Basophils Relative: 0 %
Eosinophils Absolute: 0.1 10*3/uL (ref 0.0–0.5)
Eosinophils Relative: 1 %
HCT: 33.5 % — ABNORMAL LOW (ref 39.0–52.0)
Hemoglobin: 11.4 g/dL — ABNORMAL LOW (ref 13.0–17.0)
Immature Granulocytes: 1 %
Lymphocytes Relative: 12 %
Lymphs Abs: 1.1 10*3/uL (ref 0.7–4.0)
MCH: 32.6 pg (ref 26.0–34.0)
MCHC: 34 g/dL (ref 30.0–36.0)
MCV: 95.7 fL (ref 80.0–100.0)
Monocytes Absolute: 1.1 10*3/uL — ABNORMAL HIGH (ref 0.1–1.0)
Monocytes Relative: 12 %
Neutro Abs: 7 10*3/uL (ref 1.7–7.7)
Neutrophils Relative %: 74 %
Platelets: 214 10*3/uL (ref 150–400)
RBC: 3.5 MIL/uL — ABNORMAL LOW (ref 4.22–5.81)
RDW: 12.8 % (ref 11.5–15.5)
WBC: 9.4 10*3/uL (ref 4.0–10.5)
nRBC: 0 % (ref 0.0–0.2)

## 2021-03-19 LAB — AEROBIC/ANAEROBIC CULTURE W GRAM STAIN (SURGICAL/DEEP WOUND)

## 2021-03-19 LAB — GLUCOSE, CAPILLARY
Glucose-Capillary: 142 mg/dL — ABNORMAL HIGH (ref 70–99)
Glucose-Capillary: 140 mg/dL — ABNORMAL HIGH (ref 70–99)
Glucose-Capillary: 106 mg/dL — ABNORMAL HIGH (ref 70–99)
Glucose-Capillary: 170 mg/dL — ABNORMAL HIGH (ref 70–99)

## 2021-03-19 LAB — MAGNESIUM: Magnesium: 1.7 mg/dL (ref 1.7–2.4)

## 2021-03-19 LAB — PROCALCITONIN: Procalcitonin: 0.27 ng/mL

## 2021-03-19 SURGERY — INCISION AND DRAINAGE, ABSCESS
Anesthesia: Monitor Anesthesia Care | Site: Chest | Laterality: Right

## 2021-03-19 MED ORDER — LACTATED RINGERS IV SOLN: INTRAVENOUS | Status: AC

## 2021-03-19 MED ORDER — GLUCERNA SHAKE PO LIQD
237.0000 mL | ORAL | Status: DC
  Administered 2021-03-20 – 2021-04-10 (×15): 237 mL via ORAL

## 2021-03-19 MED ORDER — BUPIVACAINE HCL (PF) 0.5 % IJ SOLN
INTRAMUSCULAR | Status: AC
  Filled 2021-03-19: qty 30

## 2021-03-19 MED ORDER — DEXAMETHASONE SODIUM PHOSPHATE 10 MG/ML IJ SOLN
INTRAMUSCULAR | Status: DC | PRN
  Administered 2021-03-19: 8 mg via INTRAVENOUS

## 2021-03-19 MED ORDER — FENTANYL CITRATE (PF) 100 MCG/2ML IJ SOLN
25.0000 ug | INTRAMUSCULAR | Status: DC | PRN
  Administered 2021-03-19 (×2): 50 ug via INTRAVENOUS

## 2021-03-19 MED ORDER — PHENYLEPHRINE 40 MCG/ML (10ML) SYRINGE FOR IV PUSH (FOR BLOOD PRESSURE SUPPORT)
PREFILLED_SYRINGE | INTRAVENOUS | Status: AC
  Filled 2021-03-19: qty 10

## 2021-03-19 MED ORDER — DEXAMETHASONE SODIUM PHOSPHATE 10 MG/ML IJ SOLN
INTRAMUSCULAR | Status: AC
  Filled 2021-03-19: qty 1

## 2021-03-19 MED ORDER — LACTATED RINGERS IV SOLN: INTRAVENOUS | Status: DC

## 2021-03-19 MED ORDER — FENTANYL CITRATE (PF) 100 MCG/2ML IJ SOLN
INTRAMUSCULAR | Status: AC
  Filled 2021-03-19: qty 2

## 2021-03-19 MED ORDER — ONDANSETRON HCL 4 MG/2ML IJ SOLN
INTRAMUSCULAR | Status: AC
  Filled 2021-03-19: qty 2

## 2021-03-19 MED ORDER — BUPIVACAINE HCL 0.25 % IJ SOLN
INTRAMUSCULAR | Status: DC | PRN
  Administered 2021-03-19: 30 mL

## 2021-03-19 MED ORDER — FENTANYL CITRATE (PF) 250 MCG/5ML IJ SOLN
INTRAMUSCULAR | Status: AC
  Filled 2021-03-19: qty 5

## 2021-03-19 MED ORDER — KETAMINE HCL 50 MG/5ML IJ SOSY
PREFILLED_SYRINGE | INTRAMUSCULAR | Status: AC
  Filled 2021-03-19: qty 5

## 2021-03-19 MED ORDER — LACTATED RINGERS IV SOLN: INTRAVENOUS | Status: DC | PRN

## 2021-03-19 MED ORDER — ONDANSETRON HCL 4 MG/2ML IJ SOLN: 4.0000 mg | Freq: Once | INTRAMUSCULAR | Status: DC | PRN

## 2021-03-19 MED ORDER — MIDAZOLAM HCL 2 MG/2ML IJ SOLN
INTRAMUSCULAR | Status: AC
  Filled 2021-03-19: qty 2

## 2021-03-19 MED ORDER — PHENYLEPHRINE HCL (PRESSORS) 10 MG/ML IV SOLN
INTRAVENOUS | Status: DC | PRN
  Administered 2021-03-19: 80 ug via INTRAVENOUS
  Administered 2021-03-19: 200 ug via INTRAVENOUS
  Administered 2021-03-19: 120 ug via INTRAVENOUS

## 2021-03-19 MED ORDER — SODIUM CHLORIDE 0.9 % IR SOLN
Status: DC | PRN
  Administered 2021-03-19: 1000 mL

## 2021-03-19 MED ORDER — CHLORHEXIDINE GLUCONATE 0.12 % MT SOLN
15.0000 mL | OROMUCOSAL | Status: AC
  Administered 2021-03-19: 15 mL via OROMUCOSAL
  Filled 2021-03-19 (×2): qty 15

## 2021-03-19 MED ORDER — BUPIVACAINE HCL (PF) 0.25 % IJ SOLN
INTRAMUSCULAR | Status: AC
  Filled 2021-03-19: qty 30

## 2021-03-19 MED ORDER — PROSOURCE PLUS PO LIQD
30.0000 mL | Freq: Three times a day (TID) | ORAL | Status: DC
  Administered 2021-03-20 – 2021-03-31 (×17): 30 mL via ORAL
  Filled 2021-03-19 (×15): qty 30

## 2021-03-19 MED ORDER — EPHEDRINE SULFATE 50 MG/ML IJ SOLN
INTRAMUSCULAR | Status: DC | PRN
  Administered 2021-03-19 (×2): 10 mg via INTRAVENOUS

## 2021-03-19 MED ORDER — LIDOCAINE HCL (CARDIAC) PF 100 MG/5ML IV SOSY
PREFILLED_SYRINGE | INTRAVENOUS | Status: AC
  Filled 2021-03-19: qty 5

## 2021-03-19 MED ORDER — ONDANSETRON HCL 4 MG/2ML IJ SOLN
INTRAMUSCULAR | Status: DC | PRN
  Administered 2021-03-19: 4 mg via INTRAVENOUS

## 2021-03-19 MED ORDER — DEXMEDETOMIDINE (PRECEDEX) IN NS 20 MCG/5ML (4 MCG/ML) IV SYRINGE
PREFILLED_SYRINGE | INTRAVENOUS | Status: DC | PRN
  Administered 2021-03-19: 12 ug via INTRAVENOUS

## 2021-03-19 MED ORDER — KETAMINE HCL 10 MG/ML IJ SOLN
INTRAMUSCULAR | Status: DC | PRN
  Administered 2021-03-19: 30 mg via INTRAVENOUS
  Administered 2021-03-19 (×2): 10 mg via INTRAVENOUS

## 2021-03-19 MED ORDER — FENTANYL CITRATE (PF) 250 MCG/5ML IJ SOLN
INTRAMUSCULAR | Status: DC | PRN
  Administered 2021-03-19: 50 ug via INTRAVENOUS
  Administered 2021-03-19: 100 ug via INTRAVENOUS
  Administered 2021-03-19 (×2): 50 ug via INTRAVENOUS

## 2021-03-19 MED ORDER — ENSURE ENLIVE PO LIQD
237.0000 mL | Freq: Two times a day (BID) | ORAL | Status: DC
  Administered 2021-03-19 – 2021-04-10 (×34): 237 mL via ORAL

## 2021-03-19 MED ORDER — MIDAZOLAM HCL 2 MG/2ML IJ SOLN
INTRAMUSCULAR | Status: DC | PRN
  Administered 2021-03-19: 2 mg via INTRAVENOUS

## 2021-03-19 MED ORDER — PROPOFOL 500 MG/50ML IV EMUL
INTRAVENOUS | Status: DC | PRN
  Administered 2021-03-19: 125 ug/kg/min via INTRAVENOUS

## 2021-03-19 SURGICAL SUPPLY — 54 items
APL SKNCLS STERI-STRIP NONHPOA (GAUZE/BANDAGES/DRESSINGS) ×1
ATTRACTOMAT 16X20 MAGNETIC DRP (DRAPES) ×2 IMPLANT
BAG DECANTER FOR FLEXI CONT (MISCELLANEOUS) ×2 IMPLANT
BENZOIN TINCTURE PRP APPL 2/3 (GAUZE/BANDAGES/DRESSINGS) ×2 IMPLANT
BLADE CLIPPER SURG (BLADE) ×2 IMPLANT
BLADE SURG 10 STRL SS (BLADE) ×2 IMPLANT
BNDG GAUZE ELAST 4 BULKY (GAUZE/BANDAGES/DRESSINGS) IMPLANT
CANISTER SUCT 3000ML PPV (MISCELLANEOUS) ×2 IMPLANT
CANISTER WOUND CARE 500ML ATS (WOUND CARE) ×2 IMPLANT
CATH FOLEY 2WAY SLVR  5CC 16FR (CATHETERS)
CATH FOLEY 2WAY SLVR 5CC 16FR (CATHETERS) IMPLANT
CLIP VESOCCLUDE SM WIDE 24/CT (CLIP) IMPLANT
CNTNR URN SCR LID CUP LEK RST (MISCELLANEOUS) IMPLANT
CONT SPEC 4OZ STRL OR WHT (MISCELLANEOUS)
COVER SURGICAL LIGHT HANDLE (MISCELLANEOUS) ×4 IMPLANT
DRAPE LAPAROSCOPIC ABDOMINAL (DRAPES) ×2 IMPLANT
DRAPE WARM FLUID 44X44 (DRAPES) IMPLANT
DRSG AQUACEL AG ADV 3.5X14 (GAUZE/BANDAGES/DRESSINGS) ×2 IMPLANT
DRSG VAC ATS LRG SENSATRAC (GAUZE/BANDAGES/DRESSINGS) IMPLANT
DRSG VAC ATS MED SENSATRAC (GAUZE/BANDAGES/DRESSINGS) ×2 IMPLANT
DRSG VAC ATS SM SENSATRAC (GAUZE/BANDAGES/DRESSINGS) IMPLANT
ELECT REM PT RETURN 9FT ADLT (ELECTROSURGICAL) ×2
ELECTRODE REM PT RTRN 9FT ADLT (ELECTROSURGICAL) ×1 IMPLANT
GAUZE SPONGE 4X4 12PLY STRL (GAUZE/BANDAGES/DRESSINGS) ×2 IMPLANT
GAUZE XEROFORM 5X9 LF (GAUZE/BANDAGES/DRESSINGS) IMPLANT
GLOVE BIO SURGEON STRL SZ7 (GLOVE) ×2 IMPLANT
GLOVE BIO SURGEON STRL SZ7.5 (GLOVE) ×2 IMPLANT
GOWN STRL REUS W/ TWL LRG LVL3 (GOWN DISPOSABLE) ×2 IMPLANT
GOWN STRL REUS W/ TWL XL LVL3 (GOWN DISPOSABLE) ×1 IMPLANT
GOWN STRL REUS W/TWL LRG LVL3 (GOWN DISPOSABLE) ×4
GOWN STRL REUS W/TWL XL LVL3 (GOWN DISPOSABLE) ×2
HANDPIECE INTERPULSE COAX TIP (DISPOSABLE)
HEMOSTAT POWDER SURGIFOAM 1G (HEMOSTASIS) IMPLANT
HEMOSTAT SURGICEL 2X14 (HEMOSTASIS) IMPLANT
KIT BASIN OR (CUSTOM PROCEDURE TRAY) ×2 IMPLANT
KIT TURNOVER KIT B (KITS) ×2 IMPLANT
NS IRRIG 1000ML POUR BTL (IV SOLUTION) ×2 IMPLANT
PACK GENERAL/GYN (CUSTOM PROCEDURE TRAY) ×2 IMPLANT
PAD ARMBOARD 7.5X6 YLW CONV (MISCELLANEOUS) ×4 IMPLANT
SET HNDPC FAN SPRY TIP SCT (DISPOSABLE) IMPLANT
SOL PREP POV-IOD 4OZ 10% (MISCELLANEOUS) IMPLANT
SPONGE LAP 18X18 RF (DISPOSABLE) ×2 IMPLANT
STAPLER VISISTAT 35W (STAPLE) IMPLANT
SUT VIC AB 1 CTX 36 (SUTURE) ×4
SUT VIC AB 1 CTX36XBRD ANBCTR (SUTURE) ×2 IMPLANT
SUT VIC AB 2-0 CTX 27 (SUTURE) ×4 IMPLANT
SUT VIC AB 3-0 X1 27 (SUTURE) ×4 IMPLANT
SWAB COLLECTION DEVICE MRSA (MISCELLANEOUS) IMPLANT
SWAB CULTURE ESWAB REG 1ML (MISCELLANEOUS) IMPLANT
SYR 5ML LL (SYRINGE) IMPLANT
TISSUE MATRIDERM M 10.5X14.8X2 (Tissue) ×2 IMPLANT
TOWEL GREEN STERILE (TOWEL DISPOSABLE) ×2 IMPLANT
TOWEL GREEN STERILE FF (TOWEL DISPOSABLE) ×2 IMPLANT
WATER STERILE IRR 1000ML POUR (IV SOLUTION) ×2 IMPLANT

## 2021-03-19 NOTE — Progress Notes (Signed)
PROGRESS NOTE                                                                                                                                                                                                             Patient Demographics:    Brian Malone, is a 70 y.o. male, DOB - 08-09-1951, JIR:[id number]  Outpatient Primary MD for the patient is Brian Malone, [name], MD    LOS - 6  Admit date - 03/13/2021    Chief Complaint  Patient presents with  . Rib Injury       Brief Narrative (HPI from H&P)  70 year old male with past medical history of hepatitis C, hypertension, hyperlipidemia, diabetes mellitus type 2, chronic pain syndrome, intravenous drug use (abstinent since 2019), major depressive disorder and HIV who had a trip and fall in his yard on 02/22/2021 after which he hurt his right-sided chest wall, at that time he was diagnosed with right-sided rib cage fracture and injury, he was seen in the ER a few times since then and was treated conservatively, his pain and discomfort continued to get worse and he presented again to [facility] on  03/13/2021 CT scan suggested that he had right chest wall abscess formation and cellulitis with concerns for sepsis.   Subjective:   Patient laying in bed appears to be in no distress at all actually close to baseline while sleeping, denies any headache, continues to complain of 10 out of 10 right-sided chest wall discomfort and wants no narcotics, no shortness of breath, has been warned again not to overuse of narcotics and anti-inflammatories accidental overdose, disability and death.   Assessment  & Plan :     1. Sepsis due to right-sided chest wall abscess formation after mechanical fall and multiple right rib fractures sustained on 02/22/2021 - he being treated with empiric IV antibiotics along with IV fluids, currently sepsis  pathophysiology has improved, Dr. Kipp Brood cardiothoracic surgery was consulted in the ER who suggested ultrasound-guided drainage of the abscess by IR, this was done by IR on 03/14/2021, infected hematoma/abscess which was drained, initially had 2 drains placed by IR on 03/15/2021, patient pulled out 1 of 2 drains 03/16/2021, repeat CT scan shows growing abscess IR likely placing a second drain again on 03/18/2021.  Case was discussed with Dr. Kipp Brood who had been  remotely following the patient, will go to OR on 03/19/2021, for open incision and drainage.  He continues to exhibit narcotic seeking behavior, has been warned multiple times as this can lead to accidental overdose and even death friend in the room on April 03, 2021 also made aware of the concerns. Kindly see nursing documentation as well.  Encouraged the patient to sit up in chair in the daytime use I-S and flutter valve for pulmonary toiletry.  Will advance activity and titrate down oxygen as possible.  SpO2: 95 % O2 Flow Rate (L/min): 2 L/min   2.  Chronic pain and narcotic use.  Supportive care as needed for acute discomfort, avoid overuse, as needed Narcan added.  Patient counseled see above.  3.  Continue home regimen follows with [facility] ID department.  4.  Anxiety and depression.  Home medications continued.  Currently not homicidal or suicidal.  5.  GERD.  On PPI.  6.  BPH.  On Flomax.  7.  AKI.  Improved after IV fluids likely due to ATN from sepsis.  8.  Hypertension.  Blood pressure soft, skipped today's blood pressure medications will monitor closely.   9.  DM type II.  On sliding scale monitor and adjust  Lab Results  Component Value Date   HGBA1C 8.3 (H) 03/14/2021   CBG (last 3)  Recent Labs    03/18/21 1647 03/18/21 2025 03/19/21 0744  GLUCAP 104* 117* 142*          Condition - Extremely Guarded  Family Communication  : None present bedside  Code Status :  Full  Consults  :  IR  PUD  Prophylaxis : PPI   Procedures  :     US guided chest wall abscess drined by IR 03/14/21   CT -  Increasing size of the right chest wall collections, primarily centered around the right sixth and eighth anterolateral ribs, with development of air bubbles. Those ribs show a pattern of irregular destruction that have progressed and look more like pathologic fractures due to infection rather than traumatic fractures. Air bubbles are now present within the collections and they probably represent abscesses. There is mild extrapleural involvement on the inner side of the ribcage, without frank breakthrough into the pleural space. The majority of the collections are on the outer side of the ribcage. No new areas of involvement are seen.  CT repeat 03/17/21 - 1. Interval decrease in size of bilobed right anterolateral chest wall fluid collection. The inferior lobulation is nearly completely resolved with some fat stranding and subcutaneous emphysema remaining. The more superior lobulation has decreased in size since prior study from 03/13/2021. The overall size of the collections now measures 7.4 x 7.1 x 3.7 cm compared to 10.1 x 9.1 x 5.7 cm on 03/13/2021. 2. Lucency in the anterior segments of the right sixth and seventh ribs again seen. Findings are suspicious for osteomyelitis, however underlying pathologic fracture is also possible.      Disposition Plan  :    Status is: Inpatient  Remains inpatient appropriate because:IV treatments appropriate due to intensity of illness or inability to take PO   Dispo: The patient is from: Home              Anticipated d/c is to: Home              Patient currently is not medically stable to d/c.   Difficult to place patient No  DVT Prophylaxis  :   Heparin  Lab Results  Component Value Date   PLT 214 03/19/2021    Diet :  Diet Order            Diet NPO time specified Except for: Sips with Meds  Diet effective midnight                   Inpatient Medications  Scheduled Meds: . abacavir-dolutegravir-lamiVUDine  1 tablet Oral Daily  . busPIRone  30 mg Oral BID  . clonazepam  0.5 mg Oral TID  . ezetimibe  10 mg Oral Daily  . feeding supplement (GLUCERNA SHAKE)  237 mL Oral TID BM  . heparin injection (subcutaneous)  5,000 Units Subcutaneous Q8H  . insulin aspart  0-15 Units Subcutaneous TID AC & HS  . isosorbide mononitrate  30 mg Oral Daily  . multivitamin with minerals  1 tablet Oral Daily  . pantoprazole  40 mg Oral QAC breakfast  . sodium chloride flush  5 mL Intracatheter Q8H  . tamsulosin  0.4 mg Oral QHS  . tiZANidine  2 mg Oral QHS   Continuous Infusions: . sodium chloride Stopped (03/13/21 2106)  . vancomycin Stopped (03/18/21 1454)   PRN Meds:.sodium chloride, acetaminophen **OR** [DISCONTINUED] acetaminophen, antiseptic oral rinse, HYDROcodone-acetaminophen, HYDROmorphone (DILAUDID) injection **OR** [DISCONTINUED]  HYDROmorphone (DILAUDID) injection, naLOXone (NARCAN)  injection, [DISCONTINUED] ondansetron **OR** ondansetron (ZOFRAN) IV, polyethylene glycol, valACYclovir  Antibiotics  :    Anti-infectives (From admission, onward)   Start     Dose/Rate Route Frequency Ordered Stop   03/15/21 1200  vancomycin (VANCOREADY) IVPB 1500 mg/300 mL        1,500 mg 150 mL/hr over 120 Minutes Intravenous Every 24 hours 03/15/21 0732     03/14/21 1200  vancomycin (VANCOREADY) IVPB 1250 mg/250 mL  Status:  Discontinued        1,250 mg 166.7 mL/hr over 90 Minutes Intravenous Every 24 hours 03/13/21 1359 03/15/21 0732   03/14/21 1023  valACYclovir (VALTREX) tablet 1,000 mg       Note to Pharmacy: Trig neuralgia flare     1,000 mg Oral 3 times daily PRN 03/14/21 1023     03/14/21 1000  abacavir-dolutegravir-lamiVUDine (TRIUMEQ) 600-50-300 MG per tablet 1 tablet        1 tablet Oral Daily 03/13/21 2320     03/14/21 0200  meropenem (MERREM) 1 g in sodium chloride 0.9 % 100 mL IVPB  Status:  Discontinued        1  g 200 mL/hr over 30 Minutes Intravenous Every 8 hours 03/13/21 2337 03/16/21 0719   03/14/21 0100  ceFEPIme (MAXIPIME) 2 g in sodium chloride 0.9 % 100 mL IVPB  Status:  Discontinued       Note to Pharmacy: Cefepime 2 g IV q12h for CrCl < 60 mL/min   2 g 200 mL/hr over 30 Minutes Intravenous Every 12 hours 03/13/21 2326 03/13/21 2331   03/13/21 2000  piperacillin-tazobactam (ZOSYN) IVPB 3.375 g  Status:  Discontinued        3.375 g 12.5 mL/hr over 240 Minutes Intravenous Every 8 hours 03/13/21 1402 03/13/21 2320   03/13/21 1430  clindamycin (CLEOCIN) IVPB 900 mg        900 mg 100 mL/hr over 30 Minutes Intravenous  Once 03/13/21 1418 03/13/21 1548   03/13/21 1230  vancomycin (VANCOCIN) IVPB 1000 mg/200 mL premix        1,000 mg 200 mL/hr over 60 Minutes Intravenous  Once 03/13/21 1229 03/13/21 1506   03/13/21 1230  piperacillin-tazobactam (ZOSYN) IVPB 3.375 g        3.375 g 100 mL/hr over 30 Minutes Intravenous  Once 03/13/21 1229 03/13/21 1400       Time Spent in minutes  30   Lala Lund M.D on 03/19/2021 at 9:06 AM  To page go to [email or web address]   [facility] -  Office  [phone]    See all Orders from today for further details    Objective:   Vitals:   03/18/21 1100 03/18/21 1337 03/18/21 2024 03/19/21 0519  BP: (!) 155/91 136/85 (!) 164/91 (!) 150/86  Pulse: 81 84 (!) 105 100  Resp: 12 16 16 15   Temp:  98.5 F (36.9 C) 98.6 F (37 C) 98.5 F (36.9 C)  TempSrc:  Oral Oral Axillary  SpO2: 100% 96% 96% 95%  Weight:      Height:        Wt Readings from Last 3 Encounters:  03/16/21 85.2 kg  03/09/21 77.1 kg  01/22/21 72.6 kg     Intake/Output Summary (Last 24 hours) at 03/19/2021 0906 Last data filed at 03/19/2021 0600 Gross per 24 hour  Intake 555 ml  Output 2420 ml  Net -1865 ml   Physical Exam  Awake Alert, No new F.N deficits, in No distress [place].AT,PERRAL Supple Neck,No JVD, No cervical lymphadenopathy appriciated.  Symmetrical Chest  wall movement, Good air movement bilaterally, CTAB RRR,No Gallops, Rubs or new Murmurs, No Parasternal Heave +ve B.Sounds, Abd Soft, No tenderness, No organomegaly appriciated, No rebound - guarding or rigidity. No Cyanosis, Clubbing or edema, No new Rash or bruise    L chest wall below on 03/14/20           Data Review:    CBC Recent Labs  Lab 03/15/21 0220 03/16/21 0226 03/17/21 0116 03/18/21 0151 03/19/21 0103  WBC 12.2* 11.7* 9.2 8.0 9.4  HGB 9.7* 10.2* 10.0* 10.2* 11.4*  HCT 29.8* 30.9* 29.6* 29.8* 33.5*  PLT 209 219 232 206 214  MCV 98.3 98.4 96.4 96.1 95.7  MCH 32.0 32.5 32.6 32.9 32.6  MCHC 32.6 33.0 33.8 34.2 34.0  RDW 13.2 13.0 13.0 12.9 12.8  LYMPHSABS 0.5* 0.8 0.9 1.1 1.1  MONOABS 0.9 1.2* 1.1* 1.0 1.1*  EOSABS 0.1 0.1 0.1 0.1 0.1  BASOSABS 0.0 0.0 0.0 0.0 0.0    Recent Labs  Lab 03/13/21 1320 03/13/21 1320 03/13/21 1615 03/14/21 0038 03/15/21 0220 03/16/21 0226 03/17/21 0116 03/18/21 0151 03/19/21 0103  NA 131*  --   --  134* 134* 134* 134* 133* 131*  K 3.5  --   --  3.5 3.7 3.2* 3.5 3.8 4.4  CL 95*  --   --  99 100 102 102 100 97*  CO2 25  --   --  28 26 26 24 26 29   GLUCOSE 327*  --   --  157* 101* 130* 198* 104* 124*  BUN 32*  --   --  24* 17 17 14 10 8   CREATININE 1.56*  --   --  1.21 1.24 1.20 1.15 1.10 1.13  CALCIUM 7.9*  --   --  8.1* 8.0* 7.8* 7.7* 8.1* 8.2*  AST 10*  --   --  10* 9* 12* 12* 13* 13*  ALT 8  --   --  8 7 7 6 8 8   ALKPHOS 91  --   --  82 98 93 98 82 90  BILITOT 0.7  --   --  1.1 1.0 0.8 0.6  0.6 0.9  ALBUMIN 2.2*  --   --  1.9* 1.7* 1.6* 1.6* 1.7* 1.8*  MG  --    < >  --  1.4* 1.8 1.7 1.6* 1.7 1.7  PROCALCITON  --    < >  --  0.56 1.00 0.68 0.35 0.26 0.27  LATICACIDVEN 1.9  --  2.4* 1.8  --   --   --   --   --   INR 1.3*  --   --  1.3*  --   --   --   --   --   HGBA1C  --   --   --  8.3*  --   --   --   --   --    < > = values in this interval not displayed.     ------------------------------------------------------------------------------------------------------------------ No results for input(s): CHOL, HDL, LDLCALC, TRIG, CHOLHDL, LDLDIRECT in the last 72 hours.  Lab Results  Component Value Date   HGBA1C 8.3 (H) 03/14/2021   ------------------------------------------------------------------------------------------------------------------ No results for input(s): TSH, T4TOTAL, T3FREE, THYROIDAB in the last 72 hours.  Invalid input(s): FREET3  Cardiac Enzymes No results for input(s): CKMB, TROPONINI, MYOGLOBIN in the last 168 hours.  Invalid input(s): CK ------------------------------------------------------------------------------------------------------------------ No results found for: BNP  Micro Results Recent Results (from the past 240 hour(s))  Urine culture     Status: None   Collection Time: 03/13/21 12:25 AM   Specimen: In/Out Cath Urine  Result Value Ref Range Status   Specimen Description IN/OUT CATH URINE  Final   Special Requests NONE  Final   Culture   Final    NO GROWTH Performed at [facility], [address], [place], [place] [address]    Report Status 03/15/2021 FINAL  Final  Blood Culture (routine x 2)     Status: None   Collection Time: 03/13/21 12:25 PM   Specimen: Right Antecubital; Blood  Result Value Ref Range Status   Specimen Description   Final    RIGHT ANTECUBITAL BLOOD Performed at [facility], [address] [place], [place] [address]    Special Requests   Final    BOTTLES DRAWN AEROBIC AND ANAEROBIC Blood Culture results may not be optimal due to an inadequate volume of blood received in culture bottles Performed at [facility], [address] [place], [place] [address]    Culture   Final    NO GROWTH 5 DAYS Performed at [facility], [place] [address] [place], [place] [address]    Report Status 03/18/2021 FINAL  Final  Blood Culture (routine x 2)      Status: None   Collection Time: 03/13/21  1:15 PM   Specimen: BLOOD RIGHT HAND  Result Value Ref Range Status   Specimen Description   Final    BLOOD RIGHT HAND BLOOD Performed at [facility], [address] [place], [place] [address]    Special Requests   Final    BOTTLES DRAWN AEROBIC AND ANAEROBIC Blood Culture results may not be optimal due to an inadequate volume of blood received in culture bottles Performed at [facility], [address] [place], [place] [address]    Culture   Final    NO GROWTH 5 DAYS Performed at [facility], [place] [address], [place], [place] [address]    Report Status 03/18/2021 FINAL  Final  Resp Panel by RT-PCR (Flu A&B, Covid) Nasopharyngeal  Swab     Status: None   Collection Time: 03/13/21  2:01 PM   Specimen: Nasopharyngeal Swab; Nasopharyngeal(NP) swabs in vial transport medium  Result Value Ref Range Status   SARS Coronavirus 2 by RT PCR NEGATIVE NEGATIVE Final    Comment: (NOTE) SARS-CoV-2 target nucleic acids are NOT DETECTED.  The SARS-CoV-2 RNA is generally detectable in upper respiratory specimens during the acute phase of infection. The lowest concentration of SARS-CoV-2 viral copies this assay can detect is 138 copies/mL. A negative result does not preclude SARS-Cov-2 infection and should not be used as the sole basis for treatment or other patient management decisions. A negative result may occur with  improper specimen collection/handling, submission of specimen other than nasopharyngeal swab, presence of viral mutation(s) within the areas targeted by this assay, and inadequate number of viral copies(<138 copies/mL). A negative result must be combined with clinical observations, patient history, and epidemiological information. The expected result is Negative.  Fact Sheet for Patients:  EntrepreneurPulse.com.au  Fact Sheet for Healthcare Providers:   IncredibleEmployment.be  This test is no t yet approved or cleared by the [place] FDA and  has been authorized for detection and/or diagnosis of SARS-CoV-2 by FDA under an Emergency Use Authorization (EUA). This EUA will remain  in effect (meaning this test can be used) for the duration of the COVID-19 declaration under Section 564(b)(1) of the Act, 21 U.S.C.section 360bbb-3(b)(1), unless the authorization is terminated  or revoked sooner.       Influenza A by PCR NEGATIVE NEGATIVE Final   Influenza B by PCR NEGATIVE NEGATIVE Final    Comment: (NOTE) The Xpert Xpress SARS-CoV-2/FLU/RSV plus assay is intended as an aid in the diagnosis of influenza from Nasopharyngeal swab specimens and should not be used as a sole basis for treatment. Nasal washings and aspirates are unacceptable for Xpert Xpress SARS-CoV-2/FLU/RSV testing.  Fact Sheet for Patients: EntrepreneurPulse.com.au  Fact Sheet for Healthcare Providers: IncredibleEmployment.be  This test is not yet approved or cleared by the [place] FDA and has been authorized for detection and/or diagnosis of SARS-CoV-2 by FDA under an Emergency Use Authorization (EUA). This EUA will remain in effect (meaning this test can be used) for the duration of the COVID-19 declaration under Section 564(b)(1) of the Act, 21 U.S.C. section 360bbb-3(b)(1), unless the authorization is terminated or revoked.  Performed at [facility], [address] [place], [place] [address]   MRSA PCR Screening     Status: Abnormal   Collection Time: 03/13/21 11:51 PM   Specimen: Nasal Mucosa; Nasopharyngeal  Result Value Ref Range Status   MRSA by PCR POSITIVE (A) NEGATIVE Final    Comment:        The GeneXpert MRSA Assay (FDA approved for NASAL specimens only), is one component of a comprehensive MRSA colonization surveillance program. It is not intended to diagnose  MRSA infection nor to guide or monitor treatment for MRSA infections. RESULT CALLED TO, READ BACK BY AND VERIFIED WITHDorna Bloom RN 03/14/21 0432 JDW Performed at [facility] [address], [place], [place] [address]   Aerobic/Anaerobic Culture (surgical/deep wound)     Status: None (Preliminary result)   Collection Time: 03/14/21  1:23 PM   Specimen: Abscess  Result Value Ref Range Status   Specimen Description ABSCESS  Final   Special Requests NONE  Final   Gram Stain   Final    ABUNDANT WBC PRESENT, PREDOMINANTLY PMN ABUNDANT GRAM POSITIVE COCCI IN CLUSTERS  Performed at [facility], [place] [address], [place], [place] [address]    Culture   Final    ABUNDANT METHICILLIN RESISTANT STAPHYLOCOCCUS AUREUS NO ANAEROBES ISOLATED; CULTURE IN PROGRESS FOR 5 DAYS    Report Status PENDING  Incomplete   Organism ID, Bacteria METHICILLIN RESISTANT STAPHYLOCOCCUS AUREUS  Final      Susceptibility   Methicillin resistant staphylococcus aureus - MIC*    CIPROFLOXACIN >=8 RESISTANT Resistant     ERYTHROMYCIN >=8 RESISTANT Resistant     GENTAMICIN <=0.5 SENSITIVE Sensitive     OXACILLIN >=4 RESISTANT Resistant     TETRACYCLINE <=1 SENSITIVE Sensitive     VANCOMYCIN <=0.5 SENSITIVE Sensitive     TRIMETH/SULFA >=320 RESISTANT Resistant     CLINDAMYCIN >=8 RESISTANT Resistant     RIFAMPIN <=0.5 SENSITIVE Sensitive     Inducible Clindamycin NEGATIVE Sensitive     * ABUNDANT METHICILLIN RESISTANT STAPHYLOCOCCUS AUREUS  Aerobic/Anaerobic Culture w Gram Stain (surgical/deep wound)     Status: None (Preliminary result)   Collection Time: 03/18/21  1:38 PM   Specimen: Abscess  Result Value Ref Range Status   Specimen Description ABSCESS  Final   Special Requests DRAIN CHEST WALL  Final   Gram Stain   Final    ABUNDANT WBC PRESENT, PREDOMINANTLY PMN ABUNDANT GRAM POSITIVE COCCI IN CLUSTERS Performed at [facility], [address], [place], [place] [address]    Culture  PENDING  Incomplete   Report Status PENDING  Incomplete    Radiology Reports CT CHEST W CONTRAST  Result Date: 03/17/2021 CLINICAL DATA:  Infected right chest wall hematoma EXAM: CT CHEST WITH CONTRAST TECHNIQUE: Multidetector CT imaging of the chest was performed during intravenous contrast administration. CONTRAST:  75 mL OMNIPAQUE IOHEXOL 350 MG/ML SOLN COMPARISON:  03/13/2021 FINDINGS: Cardiovascular: Heart size within normal limits. Coronary artery calcifications seen throughout. No significant vascular abnormality identified. Mediastinum/Nodes: No enlarged mediastinal, hilar, or axillary lymph nodes. Lungs/Pleura: Trace bilateral pleural effusions with adjacent atelectasis. Upper Abdomen: No acute abnormality. Musculoskeletal: Previously seen right chest wall collection has decreased in size since prior examination, measuring approximately 7.4 x 7.1 x 3.7 cm compared to 10.1 x 9.1 x 5.7 cm on prior exam from 03/13/2021. The collection is somewhat bilobed. The inferior portion of the right chest wall collection has decreased to a greater degree than the superior lobulation. Greater amount of air within the collection likely due to presence of drains. Lucency and irregularity of the right sixth and seventh ribs, anterior segment again noted. IMPRESSION: 1. Interval decrease in size of bilobed right anterolateral chest wall fluid collection. The inferior lobulation is nearly completely resolved with some fat stranding and subcutaneous emphysema remaining. The more superior lobulation has decreased in size since prior study from 03/13/2021. The overall size of the collections now measures 7.4 x 7.1 x 3.7 cm compared to 10.1 x 9.1 x 5.7 cm on 03/13/2021. 2. Lucency in the anterior segments of the right sixth and seventh ribs again seen. Findings are suspicious for osteomyelitis, however underlying pathologic fracture is also possible. Electronically Signed   By: Miachel Roux M.D.   On: 03/17/2021 14:05    CT Chest W Contrast  Result Date: 03/13/2021 CLINICAL DATA:  Follow-up right chest wall hematoma and fractures of the right sixth through eighth ribs. Diabetes. HIV. EXAM: CT CHEST WITH CONTRAST TECHNIQUE: Multidetector CT imaging of the chest was performed during intravenous contrast administration. CONTRAST:  135mL OMNIPAQUE IOHEXOL 300 MG/ML  SOLN COMPARISON:  03/09/2021 FINDINGS: Cardiovascular: Heart size remains normal. No pericardial fluid. Coronary artery calcification as seen previously. Aortic atherosclerotic calcification as seen previously. Mediastinum/Nodes: Normal Lungs/Pleura: Scarring/atelectasis at the lung bases left more than right unchanged since the previous study. Small areas of patchy density in the superior segment of the right lower lobe unchanged from the study of 4 days ago. No new or progressive pulmonary finding. No pneumothorax. Upper Abdomen: No upper abdominal injury or acute finding. Musculoskeletal: There is increasing size of the right chest wall collections, primarily centered around the right sixth and eighth anterolateral ribs. Those ribs show a pattern of irregular destruction that look more like pathologic fractures due to infection rather than traumatic fractures. Air bubbles are now present within the collections. There is mild extrapleural involvement in side of the ribcage, without frank breakthrough into the pleural space. Most of the collections manifest external to the ribcage. No new areas of involvement are seen. IMPRESSION: Increasing size of the right chest wall collections, primarily centered around the right sixth and eighth anterolateral ribs, with development of air bubbles. Those ribs show a pattern of irregular destruction that have progressed and look more like pathologic fractures due to infection rather than traumatic fractures. Air bubbles are now present within the collections and they probably represent abscesses. There is mild extrapleural  involvement on the inner side of the ribcage, without frank breakthrough into the pleural space. The majority of the collections are on the outer side of the ribcage. No new areas of involvement are seen. Case discussed with Dr. Regenia Skeeter at 1419 hours. Aortic Atherosclerosis (ICD10-I70.0). Electronically Signed   By: Nelson Chimes M.D.   On: 03/13/2021 14:19   CT Chest W Contrast  Result Date: 03/09/2021 CLINICAL DATA:  Fall 3 weeks prior with right rib fractures and persistent chest pain. EXAM: CT CHEST WITH CONTRAST TECHNIQUE: Multidetector CT imaging of the chest was performed during intravenous contrast administration. CONTRAST:  64mL OMNIPAQUE IOHEXOL 300 MG/ML  SOLN COMPARISON:  Chest radiograph from earlier today. 11/22/2019 chest CT angiogram. FINDINGS: Cardiovascular: Normal heart size. No significant pericardial effusion/thickening. Three-vessel coronary atherosclerosis. Atherosclerotic nonaneurysmal thoracic aorta. Top-normal caliber main pulmonary artery (3.0 cm diameter). No central pulmonary emboli. Mediastinum/Nodes: No discrete thyroid nodules. Unremarkable esophagus. No pathologically enlarged axillary, mediastinal or hilar lymph nodes. Lungs/Pleura: No pneumothorax. No pleural effusion. No acute consolidative airspace disease, lung masses or significant pulmonary nodules. Mild platelike atelectasis in the anterior basilar left lower lobe. Mild patchy ground-glass opacity in peripheral posterior right mid lung. Upper abdomen: No acute abnormality. Musculoskeletal: No aggressive appearing focal osseous lesions. Nondisplaced acute anterior right sixth, seventh and eighth rib fractures with surrounding chest wall hematoma measuring up to 8.1 x 4.9 cm in maximum axial dimensions at the level of the anterior right sixth rib fracture (series 2/image 114). Moderate thoracic spondylosis. IMPRESSION: 1. Nondisplaced acute anterior right sixth, seventh and eighth rib fractures with surrounding chest wall  hematoma. No pneumothorax or hemothorax. No discrete bone lesions. Clinical follow-up advised to ensure resolution of the right chest wall hematoma. Any need for follow-up imaging should be based on clinical assessment. 2. Mild patchy ground-glass opacity in the peripheral posterior right mid lung, nonspecific, favor mild pulmonary contusion. 3. Mild platelike atelectasis at the left lung base. 4. Three-vessel coronary atherosclerosis. 5. Aortic Atherosclerosis (ICD10-I70.0). Electronically Signed   By: Ilona Sorrel M.D.   On: 03/09/2021 13:03   IR US Guide Bx Asp/Drain  Result Date: 03/14/2021 INDICATION: 70 year old with history of fall and  right rib fractures. Patient developed right chest hematomas. Patient is complaining of pain and recent CT demonstrates gas within the hematomas. Findings are concerning for infected hematomas. EXAM: PLACEMENT OF SUPERFICIAL RIGHT ANTERIOR CHEST WALL DRAIN USING ULTRASOUND GUIDANCE x 2 MEDICATIONS: Moderate sedation ANESTHESIA/SEDATION: Fentanyl 100 mcg IV; Versed 2.0 mg IV Moderate Sedation Time:  29 minutes The patient was continuously monitored during the procedure by the interventional radiology nurse under my direct supervision. COMPLICATIONS: None immediate. PROCEDURE: Informed written consent was obtained from the patient after a thorough discussion of the procedural risks, benefits and alternatives. All questions were addressed. Maximal Sterile Barrier Technique was utilized including caps, mask, sterile gowns, sterile gloves, sterile drape, hand hygiene and skin antiseptic. A timeout was performed prior to the initiation of the procedure. The right anterior chest wall hematomas were evaluated with ultrasound. Complex collections containing a small amount of fluid were obtained. The right anterior chest was prepped with chlorhexidine and sterile field was created. Attention was initially directed to the more inferior or caudal anterior chest wall collection. Skin  was anesthetized with 1% lidocaine. Using ultrasound guidance, an 18 gauge trocar needle was directed into the collection and pink purulent fluid was aspirated. Superstiff Amplatz wire was advanced into the complex collection and the tract was dilated to accommodate a 10 [place] drain. Catheter was sutured to the skin and attached to a suction bulb. A sample of fluid was sent for culture. A second area more cephalad was targeted with ultrasound guidance. Skin was anesthetized with 1% lidocaine. Using ultrasound guidance, an 18 gauge trocar needle was directed into the complex collection. Again, purulent fluid was aspirated. Superstiff Amplatz wire was advanced into this collection and a 10 French drain was placed. Additional purulent fluid was aspirated and the catheter was sutured to skin and attached to a suction bulb. Dressings were placed over both drains. FINDINGS: Patient has palpable hematomas in the right anterior chest and upper abdomen region. Ultrasound demonstrates heterogeneous collections in both areas containing a small amount of compressible fluid. There are echogenic areas within the collections compatible with known gas. 10 French drains were placed in both areas and purulent fluid was draining from both collections. Both collections are very complex and compatible with infected hematomas. IMPRESSION: Ultrasound-guided placement of 2 percutaneous drains within the superficial right anterior chest wall collections. Findings are compatible with infected hematomas. Fluid sample was sent for culture. Electronically Signed   By: Markus Daft M.D.   On: 03/14/2021 18:37   IR US Guide Bx Asp/Drain  Result Date: 03/14/2021 INDICATION: 70 year old with history of fall and right rib fractures. Patient developed right chest hematomas. Patient is complaining of pain and recent CT demonstrates gas within the hematomas. Findings are concerning for infected hematomas. EXAM: PLACEMENT OF SUPERFICIAL RIGHT  ANTERIOR CHEST WALL DRAIN USING ULTRASOUND GUIDANCE x 2 MEDICATIONS: Moderate sedation ANESTHESIA/SEDATION: Fentanyl 100 mcg IV; Versed 2.0 mg IV Moderate Sedation Time:  29 minutes The patient was continuously monitored during the procedure by the interventional radiology nurse under my direct supervision. COMPLICATIONS: None immediate. PROCEDURE: Informed written consent was obtained from the patient after a thorough discussion of the procedural risks, benefits and alternatives. All questions were addressed. Maximal Sterile Barrier Technique was utilized including caps, mask, sterile gowns, sterile gloves, sterile drape, hand hygiene and skin antiseptic. A timeout was performed prior to the initiation of the procedure. The right anterior chest wall hematomas were evaluated with ultrasound. Complex collections containing a small amount of fluid were obtained.  The right anterior chest was prepped with chlorhexidine and sterile field was created. Attention was initially directed to the more inferior or caudal anterior chest wall collection. Skin was anesthetized with 1% lidocaine. Using ultrasound guidance, an 18 gauge trocar needle was directed into the collection and pink purulent fluid was aspirated. Superstiff Amplatz wire was advanced into the complex collection and the tract was dilated to accommodate a 10 [place] drain. Catheter was sutured to the skin and attached to a suction bulb. A sample of fluid was sent for culture. A second area more cephalad was targeted with ultrasound guidance. Skin was anesthetized with 1% lidocaine. Using ultrasound guidance, an 18 gauge trocar needle was directed into the complex collection. Again, purulent fluid was aspirated. Superstiff Amplatz wire was advanced into this collection and a 10 French drain was placed. Additional purulent fluid was aspirated and the catheter was sutured to skin and attached to a suction bulb. Dressings were placed over both drains. FINDINGS:  Patient has palpable hematomas in the right anterior chest and upper abdomen region. Ultrasound demonstrates heterogeneous collections in both areas containing a small amount of compressible fluid. There are echogenic areas within the collections compatible with known gas. 10 French drains were placed in both areas and purulent fluid was draining from both collections. Both collections are very complex and compatible with infected hematomas. IMPRESSION: Ultrasound-guided placement of 2 percutaneous drains within the superficial right anterior chest wall collections. Findings are compatible with infected hematomas. Fluid sample was sent for culture. Electronically Signed   By: Markus Daft M.D.   On: 03/14/2021 18:37   DG Chest Port 1 View  Result Date: 03/13/2021 CLINICAL DATA:  Sepsis, right rib pain. EXAM: PORTABLE CHEST 1 VIEW COMPARISON:  March 09, 2021. FINDINGS: The heart size and mediastinal contours are within normal limits. Both lungs are clear. No pneumothorax or pleural effusion is noted. The visualized skeletal structures are unremarkable. IMPRESSION: No active disease. Electronically Signed   By: Marijo Conception M.D.   On: 03/13/2021 14:11   DG Chest Portable 1 View  Result Date: 03/09/2021 CLINICAL DATA:  Chest pain. Chest Arctic hurting late yesterday. Complains of right-sided sternal pain and chest pain. EXAM: PORTABLE CHEST 1 VIEW COMPARISON:  None. FINDINGS: The heart size and mediastinal contours are within normal limits. Both lungs are clear. Blunting of the left costophrenic angle is noted, new from prior studies. The visualized skeletal structures are unremarkable. IMPRESSION: New blunting of left costophrenic angle may reflect small effusion. The lungs are otherwise clear. No signs of interstitial edema. Electronically Signed   By: Kerby Moors M.D.   On: 03/09/2021 11:26   [place] IMAGE GUIDED DRAINAGE BY PERCUTANEOUS CATHETER  Result Date: 03/18/2021 INDICATION: History of right-sided  rib fractures complicated by development of infected adjacent hematomas, post ultrasound-guided placement of two percutaneous drainage catheters on 03/14/2021. Unfortunately, one of the percutaneous drainage catheters was inadvertently removed with subsequent chest CT performed 03/17/2021 demonstrating a persistent undrained complex fluid collection involving the anterior aspect of the right chest wall. As such, request made for ultrasound-guided aspiration and/or drainage catheter placement for infection source control purposes. EXAM: 1. ULTRASOUND-GUIDED RIGHT CHEST WALL PERCUTANEOUS DRAINAGE CATHETER PLACEMENT X2 2. ULTRASOUND-GUIDED RIGHT CHEST WALL ABSCESS ASPIRATION COMPARISON:  Chest CT-03/17/2021; 03/13/2021; ultrasound-guided right chest wall percutaneous drainage catheter placement x2-03/14/2021 MEDICATIONS: The patient is currently admitted to the hospital and receiving intravenous antibiotics. The antibiotics were administered within an appropriate time frame prior to the initiation of the procedure. ANESTHESIA/SEDATION:  Moderate (conscious) sedation was employed during this procedure. A total of Versed 4 mg and Fentanyl 200 mcg was administered intravenously. Moderate Sedation Time: 41 minutes. The patient's level of consciousness and vital signs were monitored continuously by radiology nursing throughout the procedure under my direct supervision. CONTRAST:  None COMPLICATIONS: None immediate. PROCEDURE: Informed written consent was obtained from the patient after a discussion of the risks, benefits and alternatives to treatment. Preprocedural ultrasound scanning demonstrated complex fluid involving the anterior aspect of the right chest wall compatible with the findings seen on preceding chest CT. A timeout was performed prior to the initiation of the procedure. The skin overlying the right anterior chest was prepped and draped in the usual sterile fashion. The overlying soft tissues were anesthetized  with 1% lidocaine with epinephrine. Under direct ultrasound guidance, a 18 gauge trocar needle was advanced into the complex abscess within the right chest wall, inferior to the nipple. A short Amplatz wire was coiled within the collection. The track was dilated ultimately allowing placement of a 10 French percutaneous catheter. Multiple ultrasound images were saved procedural documentation purposes. Next, approximately 10 cc of purulent fluid was aspirated from the drainage catheter Sonographic evaluation demonstrates a residual complex fluid collection and as such the more medial component of the collection was accessed with an 18 gauge trocar needle. A short Amplatz wire was coiled within the collection and an additional 10 French percutaneous drainage catheter was placed under ultrasound guidance. Multiple ultrasound images were saved procedural documentation purposes. Next, approximately 30 cc of purulent fluid was aspirated from this more medially positioned chest wall drain. A representative sample was capped and sent to the laboratory for analysis. Next, the lateral component of the right chest wall collection was accessed with an 18 gauge trocar needle however only a small amount of bloody fluid was able to be aspirated. As such, a short Amplatz wire was coiled within the collection and the trocar needle was exchanged for a Yueh sheath catheter which was utilized to aspirate approximately 3 cc of thick bloody fluid. At this time, approximately 70 cc of additional blood-tinged purulent fluid was able to be expressed from the site of the previously removed percutaneous drainage catheter. Drainage catheters were secured at the entrance site within interrupted sutures and drainage catheters were connected to JP bulbs. Dressings were applied. The patient tolerated the procedure well without immediate postprocedural complication. IMPRESSION: 1. Successful ultrasound-guided placement of 2 two additional right  anterior chest wall percutaneous drainage catheters yielding a total of 40 cc of purulent fluid. A representative aspirated sample was sent to the laboratory for analysis. 2. Successful ultrasound-guided aspiration of approximately 3 cc of thick bloody fluid from the more lateral component of the right anterior chest wall complex hematoma. 3. Successful bedside expression of approximately 70 cc of purulent fluid from the entrance site of the recently removed percutaneous drainage catheter. Electronically Signed   By: Sandi Mariscal M.D.   On: 03/18/2021 15:29   [place] IMAGE GUIDED DRAINAGE BY PERCUTANEOUS CATHETER  Result Date: 03/18/2021 INDICATION: History of right-sided rib fractures complicated by development of infected adjacent hematomas, post ultrasound-guided placement of two percutaneous drainage catheters on 03/14/2021. Unfortunately, one of the percutaneous drainage catheters was inadvertently removed with subsequent chest CT performed 03/17/2021 demonstrating a persistent undrained complex fluid collection involving the anterior aspect of the right chest wall. As such, request made for ultrasound-guided aspiration and/or drainage catheter placement for infection source control purposes. EXAM: 1. ULTRASOUND-GUIDED RIGHT CHEST WALL PERCUTANEOUS  DRAINAGE CATHETER PLACEMENT X2 2. ULTRASOUND-GUIDED RIGHT CHEST WALL ABSCESS ASPIRATION COMPARISON:  Chest CT-03/17/2021; 03/13/2021; ultrasound-guided right chest wall percutaneous drainage catheter placement x2-03/14/2021 MEDICATIONS: The patient is currently admitted to the hospital and receiving intravenous antibiotics. The antibiotics were administered within an appropriate time frame prior to the initiation of the procedure. ANESTHESIA/SEDATION: Moderate (conscious) sedation was employed during this procedure. A total of Versed 4 mg and Fentanyl 200 mcg was administered intravenously. Moderate Sedation Time: 41 minutes. The patient's level of consciousness and  vital signs were monitored continuously by radiology nursing throughout the procedure under my direct supervision. CONTRAST:  None COMPLICATIONS: None immediate. PROCEDURE: Informed written consent was obtained from the patient after a discussion of the risks, benefits and alternatives to treatment. Preprocedural ultrasound scanning demonstrated complex fluid involving the anterior aspect of the right chest wall compatible with the findings seen on preceding chest CT. A timeout was performed prior to the initiation of the procedure. The skin overlying the right anterior chest was prepped and draped in the usual sterile fashion. The overlying soft tissues were anesthetized with 1% lidocaine with epinephrine. Under direct ultrasound guidance, a 18 gauge trocar needle was advanced into the complex abscess within the right chest wall, inferior to the nipple. A short Amplatz wire was coiled within the collection. The track was dilated ultimately allowing placement of a 10 French percutaneous catheter. Multiple ultrasound images were saved procedural documentation purposes. Next, approximately 10 cc of purulent fluid was aspirated from the drainage catheter Sonographic evaluation demonstrates a residual complex fluid collection and as such the more medial component of the collection was accessed with an 18 gauge trocar needle. A short Amplatz wire was coiled within the collection and an additional 10 French percutaneous drainage catheter was placed under ultrasound guidance. Multiple ultrasound images were saved procedural documentation purposes. Next, approximately 30 cc of purulent fluid was aspirated from this more medially positioned chest wall drain. A representative sample was capped and sent to the laboratory for analysis. Next, the lateral component of the right chest wall collection was accessed with an 18 gauge trocar needle however only a small amount of bloody fluid was able to be aspirated. As such, a short  Amplatz wire was coiled within the collection and the trocar needle was exchanged for a Yueh sheath catheter which was utilized to aspirate approximately 3 cc of thick bloody fluid. At this time, approximately 70 cc of additional blood-tinged purulent fluid was able to be expressed from the site of the previously removed percutaneous drainage catheter. Drainage catheters were secured at the entrance site within interrupted sutures and drainage catheters were connected to JP bulbs. Dressings were applied. The patient tolerated the procedure well without immediate postprocedural complication. IMPRESSION: 1. Successful ultrasound-guided placement of 2 two additional right anterior chest wall percutaneous drainage catheters yielding a total of 40 cc of purulent fluid. A representative aspirated sample was sent to the laboratory for analysis. 2. Successful ultrasound-guided aspiration of approximately 3 cc of thick bloody fluid from the more lateral component of the right anterior chest wall complex hematoma. 3. Successful bedside expression of approximately 70 cc of purulent fluid from the entrance site of the recently removed percutaneous drainage catheter. Electronically Signed   By: Sandi Mariscal M.D.   On: 03/18/2021 15:29   [place] FINE NEEDLE ASP 1ST LESION  Result Date: 03/18/2021 INDICATION: History of right-sided rib fractures complicated by development of infected adjacent hematomas, post ultrasound-guided placement of two percutaneous drainage catheters on 03/14/2021. Unfortunately,  one of the percutaneous drainage catheters was inadvertently removed with subsequent chest CT performed 03/17/2021 demonstrating a persistent undrained complex fluid collection involving the anterior aspect of the right chest wall. As such, request made for ultrasound-guided aspiration and/or drainage catheter placement for infection source control purposes. EXAM: 1. ULTRASOUND-GUIDED RIGHT CHEST WALL PERCUTANEOUS DRAINAGE CATHETER  PLACEMENT X2 2. ULTRASOUND-GUIDED RIGHT CHEST WALL ABSCESS ASPIRATION COMPARISON:  Chest CT-03/17/2021; 03/13/2021; ultrasound-guided right chest wall percutaneous drainage catheter placement x2-03/14/2021 MEDICATIONS: The patient is currently admitted to the hospital and receiving intravenous antibiotics. The antibiotics were administered within an appropriate time frame prior to the initiation of the procedure. ANESTHESIA/SEDATION: Moderate (conscious) sedation was employed during this procedure. A total of Versed 4 mg and Fentanyl 200 mcg was administered intravenously. Moderate Sedation Time: 41 minutes. The patient's level of consciousness and vital signs were monitored continuously by radiology nursing throughout the procedure under my direct supervision. CONTRAST:  None COMPLICATIONS: None immediate. PROCEDURE: Informed written consent was obtained from the patient after a discussion of the risks, benefits and alternatives to treatment. Preprocedural ultrasound scanning demonstrated complex fluid involving the anterior aspect of the right chest wall compatible with the findings seen on preceding chest CT. A timeout was performed prior to the initiation of the procedure. The skin overlying the right anterior chest was prepped and draped in the usual sterile fashion. The overlying soft tissues were anesthetized with 1% lidocaine with epinephrine. Under direct ultrasound guidance, a 18 gauge trocar needle was advanced into the complex abscess within the right chest wall, inferior to the nipple. A short Amplatz wire was coiled within the collection. The track was dilated ultimately allowing placement of a 10 French percutaneous catheter. Multiple ultrasound images were saved procedural documentation purposes. Next, approximately 10 cc of purulent fluid was aspirated from the drainage catheter Sonographic evaluation demonstrates a residual complex fluid collection and as such the more medial component of the  collection was accessed with an 18 gauge trocar needle. A short Amplatz wire was coiled within the collection and an additional 10 French percutaneous drainage catheter was placed under ultrasound guidance. Multiple ultrasound images were saved procedural documentation purposes. Next, approximately 30 cc of purulent fluid was aspirated from this more medially positioned chest wall drain. A representative sample was capped and sent to the laboratory for analysis. Next, the lateral component of the right chest wall collection was accessed with an 18 gauge trocar needle however only a small amount of bloody fluid was able to be aspirated. As such, a short Amplatz wire was coiled within the collection and the trocar needle was exchanged for a Yueh sheath catheter which was utilized to aspirate approximately 3 cc of thick bloody fluid. At this time, approximately 70 cc of additional blood-tinged purulent fluid was able to be expressed from the site of the previously removed percutaneous drainage catheter. Drainage catheters were secured at the entrance site within interrupted sutures and drainage catheters were connected to JP bulbs. Dressings were applied. The patient tolerated the procedure well without immediate postprocedural complication. IMPRESSION: 1. Successful ultrasound-guided placement of 2 two additional right anterior chest wall percutaneous drainage catheters yielding a total of 40 cc of purulent fluid. A representative aspirated sample was sent to the laboratory for analysis. 2. Successful ultrasound-guided aspiration of approximately 3 cc of thick bloody fluid from the more lateral component of the right anterior chest wall complex hematoma. 3. Successful bedside expression of approximately 70 cc of purulent fluid from the entrance site of the recently  removed percutaneous drainage catheter. Electronically Signed   By: Sandi Mariscal M.D.   On: 03/18/2021 15:29

## 2021-03-19 NOTE — Progress Notes (Signed)
Initial Nutrition Assessment  DOCUMENTATION CODES:   Severe malnutrition in context of acute illness/injury  INTERVENTION:    Glucerna Shake po once daily, each supplement provides 220 kcal and 10 grams of protein  Add Ensure Enlive po BID, each supplement provides 350 kcal and 20 grams of protein  Prosource Plus 45 ml PO TID with meals  Vital Cuisine Shake TID with meals, each supplement provides 520 kcal and 22 grams of protein  Continue MVI with minerals daily  NUTRITION DIAGNOSIS:   Severe Malnutrition related to acute illness (rib fractures S/P surgeries and drain placement causing pain) as evidenced by severe muscle depletion,severe fat depletion.  GOAL:   Patient will meet greater than or equal to 90% of their needs  MONITOR:   PO intake,Supplement acceptance,Labs  REASON FOR ASSESSMENT:   Malnutrition Screening Tool    ASSESSMENT:   70 yo male admitted with chest wall abscess formation and cellulitis. Recent R side rib cage fracture S/P fall 02/22/21. PMH includes hepatitis C, HTN, HLD, DM-2, chronic pain syndrome, IVDU (abstinent since 2019), MDD, HIV.   Three chest drains in place, collective output 95 ml x 24 hours.  Plans to go to the OR for open incision and drainage of chest wall abscess today. Currently NPO. Previously on a soft diet, consuming 50-100% of meals.   Patient reports that he is always in a lot of pain, which decreases his appetite and intake. He states that he is not receiving adequate pain control with medications he is receiving at this time. He drinks Glucerna shakes when provided to him. He tries to sit up for meals, but is in so much pain that he cannot eat. He states usual weight is ~210 lbs prior to having COVID last year. He never regained the weight he lost from [place] and continues to lose weight with current illness. More recently, he has been down to 157 lbs.   Patient is severely malnourished. Discussed with patient increased protein  and calorie needs for repletion of nutrition stores and prevention of further weight loss as well as for healing and recovery. He agreed to drink Ensure supplements (instead of Glucerna Shakes) to increase protein and calorie intake. He would still like to have Glucerna shakes sometimes. Reviewed options for supplements. Will send Vital Cuisine shakes with meals and order Prosource Plus to be given with meals as well.   Admission weight 80.7 kg Weight up to 85.2 kg 3/28 Increase in weight related to edema/swelling around chest abscess.   Labs reviewed. Na 131 CBG: 117-142  Medications reviewed and include Novolog SSI, MVI with minerals, Flomax.  NUTRITION - FOCUSED PHYSICAL EXAM:  unable to complete  Diet Order:   Diet Order            Diet NPO time specified Except for: Sips with Meds  Diet effective midnight                 EDUCATION NEEDS:   Education needs have been addressed  Skin:  Skin Assessment: Skin Integrity Issues: Skin Integrity Issues:: Other (Comment) Other: R chest wall abscess  Last BM:  3/30  Height:   Ht Readings from Last 1 Encounters:  03/13/21 6' (1.829 m)    Weight:   Wt Readings from Last 1 Encounters:  03/16/21 85.2 kg    Ideal Body Weight:  80.9 kg  BMI:  Body mass index is 25.47 kg/m.  Estimated Nutritional Needs:   Kcal:  2050-2250  Protein:  110-125 gm  Fluid:  >/= 2 L    Lucas Mallow, RD, LDN, CNSC Please refer to [facility] for contact information.

## 2021-03-19 NOTE — Progress Notes (Signed)
Patient ID: Brian Malone, male   DOB: 08/18/1951, 70 y.o.   MRN: [id number] TCTS:  I was called by the nurse this evening that the wound VAC had drained 300 cc of bloody fluid over 1 hour and the patient was developing large collections of fluid external to the wound VAC sponge underneath of the plastic covering dressing that were not being drained by the wound VAC.  The patient was seen by rapid response to call me to discuss situation and I came to see the patient.  We remove the plastic dressing and the 2 large collections of fluid underneath the plastic dressing were hematoma.  After removing all this and partially lifting the wound VAC sponge it was apparent that there was an arterial bleeder from the anterior end of the wound and the subcutaneous tissue.  This was controlled with digital pressure and then sutured with 3-0 nylon suture.  There was good hemostasis at that point.  I remove the old wound VAC sponge and placed a new sponge and plastic dressing.  The system is now working appropriately and we will continue observation.

## 2021-03-19 NOTE — Progress Notes (Signed)
At beginning of shift, pt requests pain regimen change. Gave pt norco that was already on [facility], and told pt dilaudid could be administered an hour later if pt still in pain. Pt did not exhibit any physiological signs of increased pain compared to previous night shifts. Pt was insistent that MD be paged with his particular request that dilaudid be ordered for q2 administrations. At 2000, pt's sister in law called concerned about pt's pain regimen and requested to speak with MD. MD paged of situation. I called sister in law when PRN order changed to inform her of update. Added that orders could be changed during the day. Explained to SIL that pt did have pain and anti-anxiety meds ordered.

## 2021-03-19 NOTE — Anesthesia Postprocedure Evaluation (Signed)
Anesthesia Post Note  Patient: Brian Malone  Procedure(s) Performed: INCISION AND DRAINAGE RIGHT CHEST ABSCESS (Right Chest)     Patient location during evaluation: PACU Anesthesia Type: MAC Level of consciousness: awake and alert Pain management: pain level controlled Vital Signs Assessment: post-procedure vital signs reviewed and stable Respiratory status: spontaneous breathing, nonlabored ventilation, respiratory function stable and patient connected to nasal cannula oxygen Cardiovascular status: stable and blood pressure returned to baseline Postop Assessment: no apparent nausea or vomiting Anesthetic complications: no   No complications documented.  Last Vitals:  Vitals:   03/19/21 1722 03/19/21 1812  BP: (!) 145/88 135/80  Pulse: 84 89  Resp: 13 15  Temp: (!) 36.2 C 36.5 C  SpO2: 97% 96%    Last Pain:  Vitals:   03/19/21 1812  TempSrc: Oral  PainSc:                  Aniayah Alaniz COKER

## 2021-03-19 NOTE — Anesthesia Procedure Notes (Signed)
Procedure Name: MAC Date/Time: 03/19/2021 2:59 PM Performed by: Kathryne Hitch, CRNA Pre-anesthesia Checklist: Patient identified, Emergency Drugs available, Suction available and Patient being monitored Patient Re-evaluated:Patient Re-evaluated prior to induction Oxygen Delivery Method: Simple face mask Preoxygenation: Pre-oxygenation with 100% oxygen Induction Type: IV induction Placement Confirmation: positive ETCO2 Dental Injury: Teeth and Oropharynx as per pre-operative assessment

## 2021-03-19 NOTE — Transfer of Care (Signed)
Immediate Anesthesia Transfer of Care Note  Patient: Brian Malone  Procedure(s) Performed: INCISION AND DRAINAGE RIGHT CHEST ABSCESS (Right Chest)  Patient Location: PACU  Anesthesia Type:MAC  Level of Consciousness: drowsy and patient cooperative  Airway & Oxygen Therapy: Patient Spontanous Breathing and Patient connected to face mask oxygen  Post-op Assessment: Report given to RN and Post -op Vital signs reviewed and stable  Post vital signs: Reviewed and stable  Last Vitals:  Vitals Value Taken Time  BP 134/82 03/19/21 1636  Temp    Pulse 84 03/19/21 1637  Resp 8 03/19/21 1637  SpO2 98 % 03/19/21 1637  Vitals shown include unvalidated device data.  Last Pain:  Vitals:   03/19/21 1301  TempSrc: Oral  PainSc:       Patients Stated Pain Goal: 6 (16/55/37 4827)  Complications: No complications documented.

## 2021-03-19 NOTE — Op Note (Signed)
     [address]       [place], [address]             [phone]       03/19/2021 Patient:  Brian Malone Pre-Op Dx: Chest wall abscess Post-op Dx: Same Procedure: -Chest wall debridement -Placement of 10 x 15 MatriDerm cell matrix. -Placement of wound VAC.  Surgeon and Role:      * Woodie Degraffenreid, Lucile Crater, MD - Primary  Anesthesia  general EBL: Minimal  Blood Administration: None Specimen: Wound culture   Counts: correct    Indications: 70 yo male with multiple medical problems including HIV and Hep C, is admitted with infected chest wall abscess that has failed percutaneous drainage.  OR tomorrow for incision and drainage, with wound vac placement.  Findings: Gross purulence involving the chest wall musculature.  Drains were removed with release of more pus.  Final wound measured 15 x 12 cm.  The wound was debrided down to healthy tissue just above the rib.  This  Operative Technique: After the risks, benefits and alternatives were thoroughly discussed, the patient was brought to the operative theatre.  Anesthesia was induced, the patient was then prepped and draped in normal sterile fashion.  An appropriate surgical pause was performed, and pre-operative antibiotics were dosed accordingly.  The wound was opened widely after all the drains were removed.  There was evidence of necrotic muscle that was completely debrided.  We then probed down by the lower chest tube site.  There is another bed of infection that had to be released.  The overlying skin was also necrotic so this was debrided as well.  At the completion of the debridement there was good healthy tissue in the wound bed.  The area was then irrigated.  The cell matrix was then applied to the wound, and an Adaptic dressing was then placed over top.  Finally it was covered with a wound VAC.  The patient tolerated the procedure without any immediate complications, and was transferred to the PACU in  stable condition.  Lajuana Matte    [facility] [phone]

## 2021-03-19 NOTE — Progress Notes (Signed)
Patient's back on unit. Patient has wound vac placed, large bubble of fluid trapped between patient and chest wall. Notified on call MD. Will continue to watch to maintain suction of wound vac

## 2021-03-19 NOTE — Progress Notes (Signed)
PT Cancellation Note  Patient Details Name: Brian Malone MRN: [id number] DOB: 1951-01-03   Cancelled Treatment:    Reason Eval/Treat Not Completed: (P) Patient at procedure or test/unavailable Pt off the floor for a procedure. PT will follow back tomorrow for treatment.   Serrita Lueth B. Migdalia Dk PT, DPT Acute Rehabilitation Services Pager [phone] Office [phone]  [place] 03/19/2021, 2:27 PM

## 2021-03-19 NOTE — Significant Event (Signed)
Rapid Response Event Note   Reason for Call :  Bleeding from wound vac  Initial Focused Assessment:  Patient is alert and oriented.  He complains of pain around incision/wound vac site.   About 300cc bloody output in an hour.   Dressing with enlarging areas of blood under dressing  BP 148/99  HR 104  RR 19  O2 sat 97  Interventions:  Dressing removed Dr Cyndia Bent placed suture. Wound Vac dressing reapplied.  No bleeding noted.  Plan of Care:  RN to call if starts bleeding   Event Summary:   MD Notified: Dr Cyndia Bent Call Time: 1848   Arrival Time: 1855 End Time: 2025  Raliegh Ip, RN

## 2021-03-19 NOTE — Anesthesia Preprocedure Evaluation (Addendum)
Anesthesia Evaluation  Patient identified by MRN, date of birth, ID band Patient awake    Reviewed: Allergy & Precautions, NPO status , Patient's Chart, lab work & pertinent test results  Airway Mallampati: II  TM Distance: >3 FB Neck ROM: Full    Dental  (+) Teeth Intact, Dental Advisory Given   Pulmonary    breath sounds clear to auscultation       Cardiovascular hypertension,  Rhythm:Regular Rate:Normal     Neuro/Psych    GI/Hepatic   Endo/Other  diabetes  Renal/GU      Musculoskeletal   Abdominal   Peds  Hematology   Anesthesia Other Findings   Reproductive/Obstetrics                            Anesthesia Physical Anesthesia Plan  ASA: III  Anesthesia Plan: General   Post-op Pain Management:    Induction: Intravenous  PONV Risk Score and Plan: Ondansetron and Propofol infusion  Airway Management Planned: Natural Airway and Simple Face Mask  Additional Equipment:   Intra-op Plan:   Post-operative Plan:   Informed Consent: I have reviewed the patients History and Physical, chart, labs and discussed the procedure including the risks, benefits and alternatives for the proposed anesthesia with the patient or authorized representative who has indicated his/her understanding and acceptance.     Dental advisory given  Plan Discussed with: Anesthesiologist and CRNA  Anesthesia Plan Comments:         Anesthesia Quick Evaluation

## 2021-03-19 NOTE — Progress Notes (Signed)
Pocket of fluid has got larger on wound vac. Notified Surgery, Dr. Candiss Norse and rapid response. Surgery and rapid on the way to bedside. New orders received from Dr. Candiss Norse

## 2021-03-19 NOTE — Progress Notes (Signed)
     [address]       [place] [address]             [phone]       No events Vitals:   03/18/21 2024 03/19/21 0519  BP: (!) 164/91 (!) 150/86  Pulse: (!) 105 100  Resp: 16 15  Temp: 98.6 F (37 C) 98.5 F (36.9 C)  SpO2: 96% 95%   OR today for I&D of right chest wall abscess  Jersi Mcmaster O Lissandra Keil

## 2021-03-19 NOTE — Progress Notes (Signed)
Patient's wound of right side of chest open and draining. Patient refuses for dressing to be placed on it due to being painful.

## 2021-03-19 NOTE — Progress Notes (Signed)
Patient requesting more pain medication, but during conversation patient is unable to keep eyes open and continues to fall asleep. Patient says his pain level is 10 out of 10, but resting comfortably with no grimacing.

## 2021-03-20 ENCOUNTER — Encounter (HOSPITAL_COMMUNITY): Payer: Self-pay | Admitting: Thoracic Surgery (Cardiothoracic Vascular Surgery)

## 2021-03-20 DIAGNOSIS — R6520 Severe sepsis without septic shock: Secondary | ICD-10-CM | POA: Diagnosis not present

## 2021-03-20 DIAGNOSIS — A419 Sepsis, unspecified organism: Secondary | ICD-10-CM | POA: Diagnosis not present

## 2021-03-20 DIAGNOSIS — N179 Acute kidney failure, unspecified: Secondary | ICD-10-CM | POA: Diagnosis not present

## 2021-03-20 DIAGNOSIS — R000 Tachycardia, unspecified: Secondary | ICD-10-CM

## 2021-03-20 LAB — CBC WITH DIFFERENTIAL/PLATELET
Abs Immature Granulocytes: 0.08 10*3/uL — ABNORMAL HIGH (ref 0.00–0.07)
Basophils Absolute: 0 10*3/uL (ref 0.0–0.1)
Basophils Relative: 0 %
Eosinophils Absolute: 0 10*3/uL (ref 0.0–0.5)
Eosinophils Relative: 0 %
HCT: 33.6 % — ABNORMAL LOW (ref 39.0–52.0)
Hemoglobin: 11.2 g/dL — ABNORMAL LOW (ref 13.0–17.0)
Immature Granulocytes: 1 %
Lymphocytes Relative: 5 %
Lymphs Abs: 0.4 10*3/uL — ABNORMAL LOW (ref 0.7–4.0)
MCH: 31.9 pg (ref 26.0–34.0)
MCHC: 33.3 g/dL (ref 30.0–36.0)
MCV: 95.7 fL (ref 80.0–100.0)
Monocytes Absolute: 0.3 10*3/uL (ref 0.1–1.0)
Monocytes Relative: 3 %
Neutro Abs: 7.1 10*3/uL (ref 1.7–7.7)
Neutrophils Relative %: 91 %
Platelets: 234 10*3/uL (ref 150–400)
RBC: 3.51 MIL/uL — ABNORMAL LOW (ref 4.22–5.81)
RDW: 12.8 % (ref 11.5–15.5)
WBC: 7.8 10*3/uL (ref 4.0–10.5)
nRBC: 0 % (ref 0.0–0.2)

## 2021-03-20 LAB — MAGNESIUM: Magnesium: 1.7 mg/dL (ref 1.7–2.4)

## 2021-03-20 LAB — GLUCOSE, CAPILLARY
Glucose-Capillary: 206 mg/dL — ABNORMAL HIGH (ref 70–99)
Glucose-Capillary: 226 mg/dL — ABNORMAL HIGH (ref 70–99)
Glucose-Capillary: 144 mg/dL — ABNORMAL HIGH (ref 70–99)
Glucose-Capillary: 184 mg/dL — ABNORMAL HIGH (ref 70–99)

## 2021-03-20 LAB — PROCALCITONIN: Procalcitonin: 0.12 ng/mL

## 2021-03-20 LAB — COMPREHENSIVE METABOLIC PANEL
ALT: 11 U/L (ref 0–44)
AST: 13 U/L — ABNORMAL LOW (ref 15–41)
Albumin: 1.9 g/dL — ABNORMAL LOW (ref 3.5–5.0)
Alkaline Phosphatase: 87 U/L (ref 38–126)
Anion gap: 10 (ref 5–15)
BUN: 16 mg/dL (ref 8–23)
CO2: 25 mmol/L (ref 22–32)
Calcium: 8.3 mg/dL — ABNORMAL LOW (ref 8.9–10.3)
Chloride: 97 mmol/L — ABNORMAL LOW (ref 98–111)
Creatinine, Ser: 1.27 mg/dL — ABNORMAL HIGH (ref 0.61–1.24)
GFR, Estimated: >60 mL/min (ref >60)
Glucose, Bld: 266 mg/dL — ABNORMAL HIGH (ref 70–99)
Potassium: 5.2 mmol/L — ABNORMAL HIGH (ref 3.5–5.1)
Sodium: 132 mmol/L — ABNORMAL LOW (ref 135–145)
Total Bilirubin: 0.7 mg/dL (ref 0.3–1.2)
Total Protein: 6.3 g/dL — ABNORMAL LOW (ref 6.5–8.1)

## 2021-03-20 LAB — VANCOMYCIN, PEAK: Vancomycin Pk: 40 ug/mL (ref 30–40)

## 2021-03-20 MED ORDER — METOPROLOL TARTRATE 25 MG PO TABS
25.0000 mg | ORAL_TABLET | Freq: Two times a day (BID) | ORAL | Status: DC
  Administered 2021-03-20 – 2021-04-10 (×40): 25 mg via ORAL
  Filled 2021-03-20 (×42): qty 1

## 2021-03-20 MED ORDER — SODIUM POLYSTYRENE SULFONATE 15 GM/60ML PO SUSP
30.0000 g | Freq: Once | ORAL | Status: DC
  Filled 2021-03-20 (×2): qty 120

## 2021-03-20 MED ORDER — METOPROLOL TARTRATE 5 MG/5ML IV SOLN
5.0000 mg | Freq: Once | INTRAVENOUS | Status: AC
  Administered 2021-03-20: 5 mg via INTRAVENOUS
  Filled 2021-03-20: qty 5

## 2021-03-20 MED ORDER — GLECAPREVIR-PIBRENTASVIR 100-40 MG PO TABS
3.0000 | ORAL_TABLET | Freq: Every day | ORAL | Status: DC
  Administered 2021-03-23 – 2021-04-02 (×10): 3 via ORAL
  Filled 2021-03-20 (×11): qty 3

## 2021-03-20 MED ORDER — SODIUM CHLORIDE 0.9 % IV BOLUS
1000.0000 mL | Freq: Once | INTRAVENOUS | Status: AC
  Administered 2021-03-20: 1000 mL via INTRAVENOUS

## 2021-03-20 MED ORDER — MAGNESIUM SULFATE 2 GM/50ML IV SOLN
2.0000 g | Freq: Once | INTRAVENOUS | Status: AC
  Administered 2021-03-20: 2 g via INTRAVENOUS
  Filled 2021-03-20: qty 50

## 2021-03-20 NOTE — Progress Notes (Signed)
1123: patient refused telemetry, Dr. Candiss Norse aware.
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
## 2021-03-20 NOTE — Progress Notes (Addendum)
1240: Patient refused Kayexalate. Patient understands the consequences of elevate potassium. Patient believe potassium will decrease on own due to the fact the he has chronic hypokalemia.     1510: Patient refused heparin. Does not want any medication that increases he chance of bleeding. Patient understands the risk of blood clots post surgical intervention.

## 2021-03-20 NOTE — Progress Notes (Signed)
[facility] night shift.  The staff reported that the patient is having sinus tachycardia in the 130s and 140s.  Earlier in the evening his wound VAC drained at least 300 mL and 2 large fluid collections described as hematomas by CT surgeon Dr. Cyndia Bent were drained.  He noticed that there was an arterial bleeding and proceeded to suture this vessel after hemostasis was obtained.  Total blood loss unknown.  His magnesium level earlier in the day was 1.7 mg/dL and the patient has been off his daily propranolol ER 60 mg.  A 1000 mL normal saline bolus, metoprolol 5 mg IVP and mag sulfate 2 g IVPB were ordered.  Brian Must, MD

## 2021-03-20 NOTE — Progress Notes (Addendum)
[address]       [place] [address]             [phone]      1 Day Post-Op Procedure(s) (LRB): INCISION AND DRAINAGE RIGHT CHEST ABSCESS (Right) Subjective:  some pain   Objective: Vital signs in last 24 hours: Temp:  [97.1 F (36.2 C)-98.3 F (36.8 C)] 98.2 F (36.8 C) (04/01 0236) Pulse Rate:  [78-105] 93 (04/01 0236) Cardiac Rhythm: Normal sinus rhythm (04/01 0704) Resp:  [10-19] 18 (04/01 0020) BP: (106-148)/(70-99) 132/84 (04/01 0236) SpO2:  [95 %-100 %] 95 % (04/01 0020) Weight:  [85.2 kg] 85.2 kg (03/31 1330)  Hemodynamic parameters for last 24 hours:    Intake/Output from previous day: 03/31 0701 - 04/01 0700 In: 2567.6 [P.O.:360; I.V.:2160.8; IV Piggyback:46.8] Out: 1060 [Urine:675; Drains:310; Blood:75] Intake/Output this shift: No intake/output data recorded.  Wound: wound vac in place, minor sero-sang drainage  Lab Results: Recent Labs    03/19/21 0103 03/20/21 0036  WBC 9.4 7.8  HGB 11.4* 11.2*  HCT 33.5* 33.6*  PLT 214 234   BMET:  Recent Labs    03/19/21 0103 03/20/21 0036  NA 131* 132*  K 4.4 5.2*  CL 97* 97*  CO2 29 25  GLUCOSE 124* 266*  BUN 8 16  CREATININE 1.13 1.27*  CALCIUM 8.2* 8.3*    PT/INR: No results for input(s): LABPROT, INR in the last 72 hours. ABG No results found for: PHART, HCO3, TCO2, ACIDBASEDEF, O2SAT CBG (last 3)  Recent Labs    03/19/21 1655 03/19/21 2123 03/20/21 0745  GLUCAP 106* 170* 206*    Meds Scheduled Meds: . (feeding supplement) PROSource Plus  30 mL Oral TID WC  . abacavir-dolutegravir-lamiVUDine  1 tablet Oral Daily  . busPIRone  30 mg Oral BID  . clonazepam  0.5 mg Oral TID  . ezetimibe  10 mg Oral Daily  . feeding supplement  237 mL Oral BID BM  . feeding supplement (GLUCERNA SHAKE)  237 mL Oral Q24H  . Glecaprevir-Pibrentasvir  3 tablet Oral Daily  . heparin injection (subcutaneous)  5,000 Units Subcutaneous Q8H  . insulin aspart  0-15 Units  Subcutaneous TID AC & HS  . metoprolol tartrate  25 mg Oral BID  . multivitamin with minerals  1 tablet Oral Daily  . pantoprazole  40 mg Oral QAC breakfast  . sodium chloride flush  5 mL Intracatheter Q8H  . sodium polystyrene  30 g Oral Once  . tamsulosin  0.4 mg Oral QHS  . tiZANidine  2 mg Oral QHS   Continuous Infusions: . sodium chloride Stopped (03/13/21 2106)  . lactated ringers 100 mL/hr at 03/20/21 0354  . vancomycin 1,500 mg (03/19/21 1259)   PRN Meds:.sodium chloride, acetaminophen **OR** [DISCONTINUED] acetaminophen, antiseptic oral rinse, HYDROcodone-acetaminophen, HYDROmorphone (DILAUDID) injection **OR** [DISCONTINUED]  HYDROmorphone (DILAUDID) injection, naLOXone (NARCAN)  injection, [DISCONTINUED] ondansetron **OR** ondansetron (ZOFRAN) IV, polyethylene glycol, valACYclovir  Xrays [place] IMAGE GUIDED DRAINAGE BY PERCUTANEOUS CATHETER  Result Date: 03/18/2021 INDICATION: History of right-sided rib fractures complicated by development of infected adjacent hematomas, post ultrasound-guided placement of two percutaneous drainage catheters on 03/14/2021. Unfortunately, one of the percutaneous drainage catheters was inadvertently removed with subsequent chest CT performed 03/17/2021 demonstrating a persistent undrained complex fluid collection involving the anterior aspect of the right chest wall. As such, request made for ultrasound-guided aspiration and/or drainage catheter placement for infection source control purposes. EXAM: 1. ULTRASOUND-GUIDED RIGHT CHEST WALL PERCUTANEOUS DRAINAGE CATHETER PLACEMENT  X2 2. ULTRASOUND-GUIDED RIGHT CHEST WALL ABSCESS ASPIRATION COMPARISON:  Chest CT-03/17/2021; 03/13/2021; ultrasound-guided right chest wall percutaneous drainage catheter placement x2-03/14/2021 MEDICATIONS: The patient is currently admitted to the hospital and receiving intravenous antibiotics. The antibiotics were administered within an appropriate time frame prior to the initiation  of the procedure. ANESTHESIA/SEDATION: Moderate (conscious) sedation was employed during this procedure. A total of Versed 4 mg and Fentanyl 200 mcg was administered intravenously. Moderate Sedation Time: 41 minutes. The patient's level of consciousness and vital signs were monitored continuously by radiology nursing throughout the procedure under my direct supervision. CONTRAST:  None COMPLICATIONS: None immediate. PROCEDURE: Informed written consent was obtained from the patient after a discussion of the risks, benefits and alternatives to treatment. Preprocedural ultrasound scanning demonstrated complex fluid involving the anterior aspect of the right chest wall compatible with the findings seen on preceding chest CT. A timeout was performed prior to the initiation of the procedure. The skin overlying the right anterior chest was prepped and draped in the usual sterile fashion. The overlying soft tissues were anesthetized with 1% lidocaine with epinephrine. Under direct ultrasound guidance, a 18 gauge trocar needle was advanced into the complex abscess within the right chest wall, inferior to the nipple. A short Amplatz wire was coiled within the collection. The track was dilated ultimately allowing placement of a 10 French percutaneous catheter. Multiple ultrasound images were saved procedural documentation purposes. Next, approximately 10 cc of purulent fluid was aspirated from the drainage catheter Sonographic evaluation demonstrates a residual complex fluid collection and as such the more medial component of the collection was accessed with an 18 gauge trocar needle. A short Amplatz wire was coiled within the collection and an additional 10 French percutaneous drainage catheter was placed under ultrasound guidance. Multiple ultrasound images were saved procedural documentation purposes. Next, approximately 30 cc of purulent fluid was aspirated from this more medially positioned chest wall drain. A  representative sample was capped and sent to the laboratory for analysis. Next, the lateral component of the right chest wall collection was accessed with an 18 gauge trocar needle however only a small amount of bloody fluid was able to be aspirated. As such, a short Amplatz wire was coiled within the collection and the trocar needle was exchanged for a Yueh sheath catheter which was utilized to aspirate approximately 3 cc of thick bloody fluid. At this time, approximately 70 cc of additional blood-tinged purulent fluid was able to be expressed from the site of the previously removed percutaneous drainage catheter. Drainage catheters were secured at the entrance site within interrupted sutures and drainage catheters were connected to JP bulbs. Dressings were applied. The patient tolerated the procedure well without immediate postprocedural complication. IMPRESSION: 1. Successful ultrasound-guided placement of 2 two additional right anterior chest wall percutaneous drainage catheters yielding a total of 40 cc of purulent fluid. A representative aspirated sample was sent to the laboratory for analysis. 2. Successful ultrasound-guided aspiration of approximately 3 cc of thick bloody fluid from the more lateral component of the right anterior chest wall complex hematoma. 3. Successful bedside expression of approximately 70 cc of purulent fluid from the entrance site of the recently removed percutaneous drainage catheter. Electronically Signed   By: Sandi Mariscal M.D.   On: 03/18/2021 15:29   [place] IMAGE GUIDED DRAINAGE BY PERCUTANEOUS CATHETER  Result Date: 03/18/2021 INDICATION: History of right-sided rib fractures complicated by development of infected adjacent hematomas, post ultrasound-guided placement of two percutaneous drainage catheters on 03/14/2021. Unfortunately, one of  the percutaneous drainage catheters was inadvertently removed with subsequent chest CT performed 03/17/2021 demonstrating a persistent  undrained complex fluid collection involving the anterior aspect of the right chest wall. As such, request made for ultrasound-guided aspiration and/or drainage catheter placement for infection source control purposes. EXAM: 1. ULTRASOUND-GUIDED RIGHT CHEST WALL PERCUTANEOUS DRAINAGE CATHETER PLACEMENT X2 2. ULTRASOUND-GUIDED RIGHT CHEST WALL ABSCESS ASPIRATION COMPARISON:  Chest CT-03/17/2021; 03/13/2021; ultrasound-guided right chest wall percutaneous drainage catheter placement x2-03/14/2021 MEDICATIONS: The patient is currently admitted to the hospital and receiving intravenous antibiotics. The antibiotics were administered within an appropriate time frame prior to the initiation of the procedure. ANESTHESIA/SEDATION: Moderate (conscious) sedation was employed during this procedure. A total of Versed 4 mg and Fentanyl 200 mcg was administered intravenously. Moderate Sedation Time: 41 minutes. The patient's level of consciousness and vital signs were monitored continuously by radiology nursing throughout the procedure under my direct supervision. CONTRAST:  None COMPLICATIONS: None immediate. PROCEDURE: Informed written consent was obtained from the patient after a discussion of the risks, benefits and alternatives to treatment. Preprocedural ultrasound scanning demonstrated complex fluid involving the anterior aspect of the right chest wall compatible with the findings seen on preceding chest CT. A timeout was performed prior to the initiation of the procedure. The skin overlying the right anterior chest was prepped and draped in the usual sterile fashion. The overlying soft tissues were anesthetized with 1% lidocaine with epinephrine. Under direct ultrasound guidance, a 18 gauge trocar needle was advanced into the complex abscess within the right chest wall, inferior to the nipple. A short Amplatz wire was coiled within the collection. The track was dilated ultimately allowing placement of a 10 French  percutaneous catheter. Multiple ultrasound images were saved procedural documentation purposes. Next, approximately 10 cc of purulent fluid was aspirated from the drainage catheter Sonographic evaluation demonstrates a residual complex fluid collection and as such the more medial component of the collection was accessed with an 18 gauge trocar needle. A short Amplatz wire was coiled within the collection and an additional 10 French percutaneous drainage catheter was placed under ultrasound guidance. Multiple ultrasound images were saved procedural documentation purposes. Next, approximately 30 cc of purulent fluid was aspirated from this more medially positioned chest wall drain. A representative sample was capped and sent to the laboratory for analysis. Next, the lateral component of the right chest wall collection was accessed with an 18 gauge trocar needle however only a small amount of bloody fluid was able to be aspirated. As such, a short Amplatz wire was coiled within the collection and the trocar needle was exchanged for a Yueh sheath catheter which was utilized to aspirate approximately 3 cc of thick bloody fluid. At this time, approximately 70 cc of additional blood-tinged purulent fluid was able to be expressed from the site of the previously removed percutaneous drainage catheter. Drainage catheters were secured at the entrance site within interrupted sutures and drainage catheters were connected to JP bulbs. Dressings were applied. The patient tolerated the procedure well without immediate postprocedural complication. IMPRESSION: 1. Successful ultrasound-guided placement of 2 two additional right anterior chest wall percutaneous drainage catheters yielding a total of 40 cc of purulent fluid. A representative aspirated sample was sent to the laboratory for analysis. 2. Successful ultrasound-guided aspiration of approximately 3 cc of thick bloody fluid from the more lateral component of the right anterior  chest wall complex hematoma. 3. Successful bedside expression of approximately 70 cc of purulent fluid from the entrance site of the recently removed  percutaneous drainage catheter. Electronically Signed   By: Sandi Mariscal M.D.   On: 03/18/2021 15:29   [place] FINE NEEDLE ASP 1ST LESION  Result Date: 03/18/2021 INDICATION: History of right-sided rib fractures complicated by development of infected adjacent hematomas, post ultrasound-guided placement of two percutaneous drainage catheters on 03/14/2021. Unfortunately, one of the percutaneous drainage catheters was inadvertently removed with subsequent chest CT performed 03/17/2021 demonstrating a persistent undrained complex fluid collection involving the anterior aspect of the right chest wall. As such, request made for ultrasound-guided aspiration and/or drainage catheter placement for infection source control purposes. EXAM: 1. ULTRASOUND-GUIDED RIGHT CHEST WALL PERCUTANEOUS DRAINAGE CATHETER PLACEMENT X2 2. ULTRASOUND-GUIDED RIGHT CHEST WALL ABSCESS ASPIRATION COMPARISON:  Chest CT-03/17/2021; 03/13/2021; ultrasound-guided right chest wall percutaneous drainage catheter placement x2-03/14/2021 MEDICATIONS: The patient is currently admitted to the hospital and receiving intravenous antibiotics. The antibiotics were administered within an appropriate time frame prior to the initiation of the procedure. ANESTHESIA/SEDATION: Moderate (conscious) sedation was employed during this procedure. A total of Versed 4 mg and Fentanyl 200 mcg was administered intravenously. Moderate Sedation Time: 41 minutes. The patient's level of consciousness and vital signs were monitored continuously by radiology nursing throughout the procedure under my direct supervision. CONTRAST:  None COMPLICATIONS: None immediate. PROCEDURE: Informed written consent was obtained from the patient after a discussion of the risks, benefits and alternatives to treatment. Preprocedural ultrasound scanning  demonstrated complex fluid involving the anterior aspect of the right chest wall compatible with the findings seen on preceding chest CT. A timeout was performed prior to the initiation of the procedure. The skin overlying the right anterior chest was prepped and draped in the usual sterile fashion. The overlying soft tissues were anesthetized with 1% lidocaine with epinephrine. Under direct ultrasound guidance, a 18 gauge trocar needle was advanced into the complex abscess within the right chest wall, inferior to the nipple. A short Amplatz wire was coiled within the collection. The track was dilated ultimately allowing placement of a 10 French percutaneous catheter. Multiple ultrasound images were saved procedural documentation purposes. Next, approximately 10 cc of purulent fluid was aspirated from the drainage catheter Sonographic evaluation demonstrates a residual complex fluid collection and as such the more medial component of the collection was accessed with an 18 gauge trocar needle. A short Amplatz wire was coiled within the collection and an additional 10 French percutaneous drainage catheter was placed under ultrasound guidance. Multiple ultrasound images were saved procedural documentation purposes. Next, approximately 30 cc of purulent fluid was aspirated from this more medially positioned chest wall drain. A representative sample was capped and sent to the laboratory for analysis. Next, the lateral component of the right chest wall collection was accessed with an 18 gauge trocar needle however only a small amount of bloody fluid was able to be aspirated. As such, a short Amplatz wire was coiled within the collection and the trocar needle was exchanged for a Yueh sheath catheter which was utilized to aspirate approximately 3 cc of thick bloody fluid. At this time, approximately 70 cc of additional blood-tinged purulent fluid was able to be expressed from the site of the previously removed percutaneous  drainage catheter. Drainage catheters were secured at the entrance site within interrupted sutures and drainage catheters were connected to JP bulbs. Dressings were applied. The patient tolerated the procedure well without immediate postprocedural complication. IMPRESSION: 1. Successful ultrasound-guided placement of 2 two additional right anterior chest wall percutaneous drainage catheters yielding a total of 40 cc of purulent fluid. A  representative aspirated sample was sent to the laboratory for analysis. 2. Successful ultrasound-guided aspiration of approximately 3 cc of thick bloody fluid from the more lateral component of the right anterior chest wall complex hematoma. 3. Successful bedside expression of approximately 70 cc of purulent fluid from the entrance site of the recently removed percutaneous drainage catheter. Electronically Signed   By: Sandi Mariscal M.D.   On: 03/18/2021 15:29    Assessment/Plan: S/P Procedure(s) (LRB): INCISION AND DRAINAGE RIGHT CHEST ABSCESS (Right)  1 afeb, VSS,  Pain at times - cont prn's 2 sats good on RA 3 H/H stable 4 creat up slightly 5 no further bleeding  6 cont VAC, dressing change next week 7 medical management as per primary team    LOS: 7 days    John Giovanni PA-C Pager [phone] 03/20/2021  The wound VAC replaced overnight due to incisional bleeding. Overall stable now. We will keep wound VAC in place for a total of 5 days prior to first change.

## 2021-03-20 NOTE — Plan of Care (Signed)
  Problem: Education: Goal: Knowledge of General Education information will improve Description: Including pain rating scale, medication(s)/side effects and non-pharmacologic comfort measures Outcome: Progressing   Problem: Clinical Measurements: Goal: Ability to maintain clinical measurements within normal limits will improve Outcome: Progressing Goal: Respiratory complications will improve Outcome: Progressing   Problem: Activity: Goal: Risk for activity intolerance will decrease Outcome: Progressing   Problem: Pain Managment: Goal: General experience of comfort will improve Outcome: Progressing   

## 2021-03-20 NOTE — Progress Notes (Signed)
PROGRESS NOTE                                                                                                                                                                                                             Patient Demographics:    Brian Malone, is a 70 y.o. male, DOB - 02/07/1951, BMW:[id number]  Outpatient Primary MD for the patient is [name], [name], MD    LOS - 7  Admit date - 03/13/2021    Chief Complaint  Patient presents with  . Rib Injury       Brief Narrative (HPI from H&P)  70 year old male with past medical history of hepatitis C, hypertension, hyperlipidemia, diabetes mellitus type 2, chronic pain syndrome, intravenous drug use (abstinent since 2019), major depressive disorder and HIV who had a trip and fall in his yard on 02/22/2021 after which he hurt his right-sided chest wall, at that time he was diagnosed with right-sided rib cage fracture and injury, he was seen in the ER a few times since then and was treated conservatively, his pain and discomfort continued to get worse and he presented again to [facility] on  03/13/2021 CT scan suggested that he had right chest wall abscess formation and cellulitis with concerns for sepsis.   Subjective:   Patient in bed, appears comfortable, denies any headache, no fever, no chest pain or pressure, no shortness of breath , no abdominal pain. No new focal weakness.   Assessment  & Plan :     1. Sepsis due to right-sided chest wall abscess formation after mechanical fall and multiple right rib fractures sustained on 02/22/2021 - he being treated with empiric IV antibiotics along with IV fluids, currently sepsis pathophysiology has improved, Dr. Kipp Brood cardiothoracic surgery was consulted in the ER who suggested ultrasound-guided drainage of the abscess by IR, this was done by IR on 03/14/2021, infected  hematoma/abscess which was drained, fluid growing MRSA .  Despite having drains placed by IR his infection continue to get worse, eventually he was taken to the OR by Dr. Kipp Brood on 03/19/2021, he had some postop bleeding from the site which was stabilized by cardiothoracic surgery tonight 03/09/2021, now has a wound VAC in place and clinically looks better.  Continue vancomycin for now and monitor.  Encouraged the patient to sit up in chair  in the daytime use I-S and flutter valve for pulmonary toiletry.  Will advance activity and titrate down oxygen as possible.   2.  Chronic pain and narcotic use.  Supportive care as needed for acute discomfort, avoid overuse, as needed Narcan added.  Patient counseled see above.  He does exhibit narcotic seeking behavior, has been warned multiple times as this can lead to accidental overdose and even death friend in the room on 2021/03/27 also made aware of the concerns. Kindly see nursing documentation as well.   3.  Continue home regimen follows with [facility] ID department.  4.  Anxiety and depression.  Home medications continued.  Currently not homicidal or suicidal.  5.  GERD.  On PPI.  6.  BPH.  On Flomax.  7.  AKI.  Improved after IV fluids likely due to ATN from sepsis.  8.  Hypertension.  Blood pressure soft, skipped today's blood pressure medications will monitor closely.   9.  DM type II.  On sliding scale monitor and adjust  Lab Results  Component Value Date   HGBA1C 8.3 (H) 03/14/2021   CBG (last 3)  Recent Labs    03/19/21 1655 03/19/21 2123 03/20/21 0745  GLUCAP 106* 170* 206*          Condition - Extremely Guarded  Family Communication  : None present bedside  Code Status :  Full  Consults  :  IR  PUD Prophylaxis : PPI   Procedures  :     US guided chest wall abscess drined by IR 03/14/21   CT -  Increasing size of the right chest wall collections, primarily centered around the right sixth and eighth  anterolateral ribs, with development of air bubbles. Those ribs show a pattern of irregular destruction that have progressed and look more like pathologic fractures due to infection rather than traumatic fractures. Air bubbles are now present within the collections and they probably represent abscesses. There is mild extrapleural involvement on the inner side of the ribcage, without frank breakthrough into the pleural space. The majority of the collections are on the outer side of the ribcage. No new areas of involvement are seen.  CT repeat 03/17/21 - 1. Interval decrease in size of bilobed right anterolateral chest wall fluid collection. The inferior lobulation is nearly completely resolved with some fat stranding and subcutaneous emphysema remaining. The more superior lobulation has decreased in size since prior study from 03/13/2021. The overall size of the collections now measures 7.4 x 7.1 x 3.7 cm compared to 10.1 x 9.1 x 5.7 cm on 03/13/2021. 2. Lucency in the anterior segments of the right sixth and seventh ribs again seen. Findings are suspicious for osteomyelitis, however underlying pathologic fracture is also possible.      Disposition Plan  :    Status is: Inpatient  Remains inpatient appropriate because:IV treatments appropriate due to intensity of illness or inability to take PO   Dispo: The patient is from: Home              Anticipated d/c is to: Home              Patient currently is not medically stable to d/c.   Difficult to place patient No  DVT Prophylaxis  :   Heparin   Lab Results  Component Value Date   PLT 234 03/20/2021    Diet :  Diet Order            Diet regular Room service  appropriate? Yes; Fluid consistency: Thin  Diet effective now                  Inpatient Medications  Scheduled Meds: . (feeding supplement) PROSource Plus  30 mL Oral TID WC  . abacavir-dolutegravir-lamiVUDine  1 tablet Oral Daily  . busPIRone  30 mg Oral BID  . clonazepam   0.5 mg Oral TID  . ezetimibe  10 mg Oral Daily  . feeding supplement  237 mL Oral BID BM  . feeding supplement (GLUCERNA SHAKE)  237 mL Oral Q24H  . Glecaprevir-Pibrentasvir  3 tablet Oral Daily  . heparin injection (subcutaneous)  5,000 Units Subcutaneous Q8H  . insulin aspart  0-15 Units Subcutaneous TID AC & HS  . metoprolol tartrate  25 mg Oral BID  . multivitamin with minerals  1 tablet Oral Daily  . pantoprazole  40 mg Oral QAC breakfast  . sodium chloride flush  5 mL Intracatheter Q8H  . sodium polystyrene  30 g Oral Once  . tamsulosin  0.4 mg Oral QHS  . tiZANidine  2 mg Oral QHS   Continuous Infusions: . sodium chloride Stopped (03/13/21 2106)  . lactated ringers 50 mL/hr at 03/20/21 0846  . vancomycin 1,500 mg (03/19/21 1259)   PRN Meds:.sodium chloride, acetaminophen **OR** [DISCONTINUED] acetaminophen, antiseptic oral rinse, HYDROcodone-acetaminophen, HYDROmorphone (DILAUDID) injection **OR** [DISCONTINUED]  HYDROmorphone (DILAUDID) injection, naLOXone (NARCAN)  injection, [DISCONTINUED] ondansetron **OR** ondansetron (ZOFRAN) IV, polyethylene glycol, valACYclovir  Antibiotics  :    Anti-infectives (From admission, onward)   Start     Dose/Rate Route Frequency Ordered Stop   03/20/21 1000  Glecaprevir-Pibrentasvir 100-40 MG TABS 3 tablet        3 tablet Oral Daily 03/20/21 0733     03/15/21 1200  vancomycin (VANCOREADY) IVPB 1500 mg/300 mL        1,500 mg 150 mL/hr over 120 Minutes Intravenous Every 24 hours 03/15/21 0732     03/14/21 1200  vancomycin (VANCOREADY) IVPB 1250 mg/250 mL  Status:  Discontinued        1,250 mg 166.7 mL/hr over 90 Minutes Intravenous Every 24 hours 03/13/21 1359 03/15/21 0732   03/14/21 1023  valACYclovir (VALTREX) tablet 1,000 mg       Note to Pharmacy: Trig neuralgia flare     1,000 mg Oral 3 times daily PRN 03/14/21 1023     03/14/21 1000  abacavir-dolutegravir-lamiVUDine (TRIUMEQ) 600-50-300 MG per tablet 1 tablet        1 tablet Oral  Daily 03/13/21 2320     03/14/21 0200  meropenem (MERREM) 1 g in sodium chloride 0.9 % 100 mL IVPB  Status:  Discontinued        1 g 200 mL/hr over 30 Minutes Intravenous Every 8 hours 03/13/21 2337 03/16/21 0719   03/14/21 0100  ceFEPIme (MAXIPIME) 2 g in sodium chloride 0.9 % 100 mL IVPB  Status:  Discontinued       Note to Pharmacy: Cefepime 2 g IV q12h for CrCl < 60 mL/min   2 g 200 mL/hr over 30 Minutes Intravenous Every 12 hours 03/13/21 2326 03/13/21 2331   03/13/21 2000  piperacillin-tazobactam (ZOSYN) IVPB 3.375 g  Status:  Discontinued        3.375 g 12.5 mL/hr over 240 Minutes Intravenous Every 8 hours 03/13/21 1402 03/13/21 2320   03/13/21 1430  clindamycin (CLEOCIN) IVPB 900 mg        900 mg 100 mL/hr over 30 Minutes Intravenous  Once 03/13/21 1418  03/13/21 1548   03/13/21 1230  vancomycin (VANCOCIN) IVPB 1000 mg/200 mL premix        1,000 mg 200 mL/hr over 60 Minutes Intravenous  Once 03/13/21 1229 03/13/21 1506   03/13/21 1230  piperacillin-tazobactam (ZOSYN) IVPB 3.375 g        3.375 g 100 mL/hr over 30 Minutes Intravenous  Once 03/13/21 1229 03/13/21 1400       Time Spent in minutes  30   Lala Lund M.D on 03/20/2021 at 11:54 AM  To page go to [email or web address]   [facility] -  Office  [phone]    See all Orders from today for further details    Objective:   Vitals:   03/19/21 1812 03/19/21 1910 03/20/21 0020 03/20/21 0236  BP: 135/80 (!) 148/99 (!) 141/93 132/84  Pulse: 89 (!) 104 (!) 105 93  Resp: 15 19 18    Temp: 97.7 F (36.5 C) 97.8 F (36.6 C) 98.1 F (36.7 C) 98.2 F (36.8 C)  TempSrc: Oral Oral Oral Oral  SpO2: 96% 97% 95%   Weight:      Height:        Wt Readings from Last 3 Encounters:  03/19/21 85.2 kg  03/09/21 77.1 kg  01/22/21 72.6 kg     Intake/Output Summary (Last 24 hours) at 03/20/2021 1154 Last data filed at 03/20/2021 1006 Gross per 24 hour  Intake 2567.58 ml  Output 1460 ml  Net 1107.58 ml   Physical  Exam  Awake Alert, No new F.N deficits, Normal affect [place].AT,PERRAL Supple Neck,No JVD, No cervical lymphadenopathy appriciated.  Symmetrical Chest wall movement, Good air movement bilaterally, CTAB RRR,No Gallops, Rubs or new Murmurs, No Parasternal Heave +ve B.Sounds, Abd Soft, No tenderness, No organomegaly appriciated, No rebound - guarding or rigidity. No Cyanosis, Clubbing or edema, No new Rash or bruise    Data Review:    CBC Recent Labs  Lab 03/16/21 0226 03/17/21 0116 03/18/21 0151 03/19/21 0103 03/20/21 0036  WBC 11.7* 9.2 8.0 9.4 7.8  HGB 10.2* 10.0* 10.2* 11.4* 11.2*  HCT 30.9* 29.6* 29.8* 33.5* 33.6*  PLT 219 232 206 214 234  MCV 98.4 96.4 96.1 95.7 95.7  MCH 32.5 32.6 32.9 32.6 31.9  MCHC 33.0 33.8 34.2 34.0 33.3  RDW 13.0 13.0 12.9 12.8 12.8  LYMPHSABS 0.8 0.9 1.1 1.1 0.4*  MONOABS 1.2* 1.1* 1.0 1.1* 0.3  EOSABS 0.1 0.1 0.1 0.1 0.0  BASOSABS 0.0 0.0 0.0 0.0 0.0    Recent Labs  Lab 03/13/21 1320 03/13/21 1615 03/14/21 0038 03/15/21 0220 03/16/21 0226 03/17/21 0116 03/18/21 0151 03/19/21 0103 03/20/21 0036  NA 131*  --  134*   < > 134* 134* 133* 131* 132*  K 3.5  --  3.5   < > 3.2* 3.5 3.8 4.4 5.2*  CL 95*  --  99   < > 102 102 100 97* 97*  CO2 25  --  28   < > 26 24 26 29 25   GLUCOSE 327*  --  157*   < > 130* 198* 104* 124* 266*  BUN 32*  --  24*   < > 17 14 10 8 16   CREATININE 1.56*  --  1.21   < > 1.20 1.15 1.10 1.13 1.27*  CALCIUM 7.9*  --  8.1*   < > 7.8* 7.7* 8.1* 8.2* 8.3*  AST 10*  --  10*   < > 12* 12* 13* 13* 13*  ALT 8  --  8   < >  7 6 8 8 11   ALKPHOS 91  --  82   < > 93 98 82 90 87  BILITOT 0.7  --  1.1   < > 0.8 0.6 0.6 0.9 0.7  ALBUMIN 2.2*  --  1.9*   < > 1.6* 1.6* 1.7* 1.8* 1.9*  MG  --   --  1.4*   < > 1.7 1.6* 1.7 1.7 1.7  PROCALCITON  --   --  0.56   < > 0.68 0.35 0.26 0.27 0.12  LATICACIDVEN 1.9 2.4* 1.8  --   --   --   --   --   --   INR 1.3*  --  1.3*  --   --   --   --   --   --   HGBA1C  --   --  8.3*  --   --   --    --   --   --    < > = values in this interval not displayed.    ------------------------------------------------------------------------------------------------------------------ No results for input(s): CHOL, HDL, LDLCALC, TRIG, CHOLHDL, LDLDIRECT in the last 72 hours.  Lab Results  Component Value Date   HGBA1C 8.3 (H) 03/14/2021   ------------------------------------------------------------------------------------------------------------------ No results for input(s): TSH, T4TOTAL, T3FREE, THYROIDAB in the last 72 hours.  Invalid input(s): FREET3  Cardiac Enzymes No results for input(s): CKMB, TROPONINI, MYOGLOBIN in the last 168 hours.  Invalid input(s): CK ------------------------------------------------------------------------------------------------------------------ No results found for: BNP  Micro Results Recent Results (from the past 240 hour(s))  Urine culture     Status: None   Collection Time: 03/13/21 12:25 AM   Specimen: In/Out Cath Urine  Result Value Ref Range Status   Specimen Description IN/OUT CATH URINE  Final   Special Requests NONE  Final   Culture   Final    NO GROWTH Performed at [facility], [address] [place], [place] [address]    Report Status 03/15/2021 FINAL  Final  Blood Culture (routine x 2)     Status: None   Collection Time: 03/13/21 12:25 PM   Specimen: Right Antecubital; Blood  Result Value Ref Range Status   Specimen Description   Final    RIGHT ANTECUBITAL BLOOD Performed at [facility], [address] [place], [place] [address]    Special Requests   Final    BOTTLES DRAWN AEROBIC AND ANAEROBIC Blood Culture results may not be optimal due to an inadequate volume of blood received in culture bottles Performed at [facility], [address] [place], [place] [address]    Culture   Final    NO GROWTH 5 DAYS Performed at [facility], [place] [place], [place] [address]    Report  Status 03/18/2021 FINAL  Final  Blood Culture (routine x 2)     Status: None   Collection Time: 03/13/21  1:15 PM   Specimen: BLOOD RIGHT HAND  Result Value Ref Range Status   Specimen Description   Final    BLOOD RIGHT HAND BLOOD Performed at [facility], [address] [place], [place] [address]    Special Requests   Final    BOTTLES DRAWN AEROBIC AND ANAEROBIC Blood Culture results may not be optimal due to an inadequate volume of blood received in culture bottles Performed at [facility], [address] [place], [place] [address]    Culture   Final  NO GROWTH 5 DAYS Performed at [facility], [address], [place], [place] [address]    Report Status 03/18/2021 FINAL  Final  Resp Panel by RT-PCR (Flu A&B, Covid) Nasopharyngeal Swab     Status: None   Collection Time: 03/13/21  2:01 PM   Specimen: Nasopharyngeal Swab; Nasopharyngeal(NP) swabs in vial transport medium  Result Value Ref Range Status   SARS Coronavirus 2 by RT PCR NEGATIVE NEGATIVE Final    Comment: (NOTE) SARS-CoV-2 target nucleic acids are NOT DETECTED.  The SARS-CoV-2 RNA is generally detectable in upper respiratory specimens during the acute phase of infection. The lowest concentration of SARS-CoV-2 viral copies this assay can detect is 138 copies/mL. A negative result does not preclude SARS-Cov-2 infection and should not be used as the sole basis for treatment or other patient management decisions. A negative result may occur with  improper specimen collection/handling, submission of specimen other than nasopharyngeal swab, presence of viral mutation(s) within the areas targeted by this assay, and inadequate number of viral copies(<138 copies/mL). A negative result must be combined with clinical observations, patient history, and epidemiological information. The expected result is Negative.  Fact Sheet for Patients:  EntrepreneurPulse.com.au  Fact  Sheet for Healthcare Providers:  IncredibleEmployment.be  This test is no t yet approved or cleared by the [place] FDA and  has been authorized for detection and/or diagnosis of SARS-CoV-2 by FDA under an Emergency Use Authorization (EUA). This EUA will remain  in effect (meaning this test can be used) for the duration of the COVID-19 declaration under Section 564(b)(1) of the Act, 21 U.S.C.section 360bbb-3(b)(1), unless the authorization is terminated  or revoked sooner.       Influenza A by PCR NEGATIVE NEGATIVE Final   Influenza B by PCR NEGATIVE NEGATIVE Final    Comment: (NOTE) The Xpert Xpress SARS-CoV-2/FLU/RSV plus assay is intended as an aid in the diagnosis of influenza from Nasopharyngeal swab specimens and should not be used as a sole basis for treatment. Nasal washings and aspirates are unacceptable for Xpert Xpress SARS-CoV-2/FLU/RSV testing.  Fact Sheet for Patients: EntrepreneurPulse.com.au  Fact Sheet for Healthcare Providers: IncredibleEmployment.be  This test is not yet approved or cleared by the [place] FDA and has been authorized for detection and/or diagnosis of SARS-CoV-2 by FDA under an Emergency Use Authorization (EUA). This EUA will remain in effect (meaning this test can be used) for the duration of the COVID-19 declaration under Section 564(b)(1) of the Act, 21 U.S.C. section 360bbb-3(b)(1), unless the authorization is terminated or revoked.  Performed at [facility], [address] [place], [place] [address]   MRSA PCR Screening     Status: Abnormal   Collection Time: 03/13/21 11:51 PM   Specimen: Nasal Mucosa; Nasopharyngeal  Result Value Ref Range Status   MRSA by PCR POSITIVE (A) NEGATIVE Final    Comment:        The GeneXpert MRSA Assay (FDA approved for NASAL specimens only), is one component of a comprehensive MRSA colonization surveillance program. It is  not intended to diagnose MRSA infection nor to guide or monitor treatment for MRSA infections. RESULT CALLED TO, READ BACK BY AND VERIFIED WITHDorna Bloom RN 03/14/21 0432 JDW Performed at [facility] [address] [place], [place] [address]   Aerobic/Anaerobic Culture (surgical/deep wound)     Status: None   Collection Time: 03/14/21  1:23 PM   Specimen: Abscess  Result Value Ref Range Status  Specimen Description ABSCESS  Final   Special Requests NONE  Final   Gram Stain   Final    ABUNDANT WBC PRESENT, PREDOMINANTLY PMN ABUNDANT GRAM POSITIVE COCCI IN CLUSTERS    Culture   Final    ABUNDANT METHICILLIN RESISTANT STAPHYLOCOCCUS AUREUS NO ANAEROBES ISOLATED Performed at [facility], [address] [place], [place] [address]    Report Status 03/19/2021 FINAL  Final   Organism ID, Bacteria METHICILLIN RESISTANT STAPHYLOCOCCUS AUREUS  Final      Susceptibility   Methicillin resistant staphylococcus aureus - MIC*    CIPROFLOXACIN >=8 RESISTANT Resistant     ERYTHROMYCIN >=8 RESISTANT Resistant     GENTAMICIN <=0.5 SENSITIVE Sensitive     OXACILLIN >=4 RESISTANT Resistant     TETRACYCLINE <=1 SENSITIVE Sensitive     VANCOMYCIN <=0.5 SENSITIVE Sensitive     TRIMETH/SULFA >=320 RESISTANT Resistant     CLINDAMYCIN >=8 RESISTANT Resistant     RIFAMPIN <=0.5 SENSITIVE Sensitive     Inducible Clindamycin NEGATIVE Sensitive     * ABUNDANT METHICILLIN RESISTANT STAPHYLOCOCCUS AUREUS  Aerobic/Anaerobic Culture w Gram Stain (surgical/deep wound)     Status: None (Preliminary result)   Collection Time: 03/18/21  1:38 PM   Specimen: Abscess  Result Value Ref Range Status   Specimen Description ABSCESS  Final   Special Requests DRAIN CHEST WALL  Final   Gram Stain   Final    ABUNDANT WBC PRESENT, PREDOMINANTLY PMN ABUNDANT GRAM POSITIVE COCCI IN CLUSTERS Performed at [facility], [address], [place], [place] [address]    Culture   Final    ABUNDANT  METHICILLIN RESISTANT STAPHYLOCOCCUS AUREUS   Report Status PENDING  Incomplete   Organism ID, Bacteria METHICILLIN RESISTANT STAPHYLOCOCCUS AUREUS  Final      Susceptibility   Methicillin resistant staphylococcus aureus - MIC*    CIPROFLOXACIN >=8 RESISTANT Resistant     ERYTHROMYCIN >=8 RESISTANT Resistant     GENTAMICIN <=0.5 SENSITIVE Sensitive     OXACILLIN >=4 RESISTANT Resistant     TETRACYCLINE <=1 SENSITIVE Sensitive     VANCOMYCIN <=0.5 SENSITIVE Sensitive     TRIMETH/SULFA >=320 RESISTANT Resistant     CLINDAMYCIN >=8 RESISTANT Resistant     RIFAMPIN <=0.5 SENSITIVE Sensitive     Inducible Clindamycin NEGATIVE Sensitive     * ABUNDANT METHICILLIN RESISTANT STAPHYLOCOCCUS AUREUS  Aerobic/Anaerobic Culture w Gram Stain (surgical/deep wound)     Status: None (Preliminary result)   Collection Time: 03/19/21  3:07 PM   Specimen: Wound; Abscess  Result Value Ref Range Status   Specimen Description ABSCESS RIGHT CHEST  Final   Special Requests   Final    NONE Performed at [facility], [address] [place], [place] [address]    Gram Stain PENDING  Incomplete   Culture FEW STAPHYLOCOCCUS AUREUS  Final   Report Status PENDING  Incomplete    Radiology Reports CT CHEST W CONTRAST  Result Date: 03/17/2021 CLINICAL DATA:  Infected right chest wall hematoma EXAM: CT CHEST WITH CONTRAST TECHNIQUE: Multidetector CT imaging of the chest was performed during intravenous contrast administration. CONTRAST:  75 mL OMNIPAQUE IOHEXOL 350 MG/ML SOLN COMPARISON:  03/13/2021 FINDINGS: Cardiovascular: Heart size within normal limits. Coronary artery calcifications seen throughout. No significant vascular abnormality identified. Mediastinum/Nodes: No enlarged mediastinal, hilar, or axillary lymph nodes. Lungs/Pleura: Trace bilateral pleural effusions with adjacent atelectasis. Upper Abdomen: No acute abnormality. Musculoskeletal: Previously seen right chest wall collection has decreased in  size since prior examination, measuring approximately 7.4 x 7.1 x 3.7 cm compared to 10.1 x 9.1 x 5.7 cm on prior exam from 03/13/2021. The collection is somewhat bilobed. The inferior portion of the right chest wall collection has decreased to a greater degree than the superior lobulation. Greater amount of air within the collection likely due to presence of drains. Lucency and irregularity of the right sixth and seventh ribs, anterior segment again noted. IMPRESSION: 1. Interval decrease in size of bilobed right anterolateral chest wall fluid collection. The inferior lobulation is nearly completely resolved with some fat stranding and subcutaneous emphysema remaining. The more superior lobulation has decreased in size since prior study from 03/13/2021. The overall size of the collections now measures 7.4 x 7.1 x 3.7 cm compared to 10.1 x 9.1 x 5.7 cm on 03/13/2021. 2. Lucency in the anterior segments of the right sixth and seventh ribs again seen. Findings are suspicious for osteomyelitis, however underlying pathologic fracture is also possible. Electronically Signed   By: Miachel Roux M.D.   On: 03/17/2021 14:05   CT Chest W Contrast  Result Date: 03/13/2021 CLINICAL DATA:  Follow-up right chest wall hematoma and fractures of the right sixth through eighth ribs. Diabetes. HIV. EXAM: CT CHEST WITH CONTRAST TECHNIQUE: Multidetector CT imaging of the chest was performed during intravenous contrast administration. CONTRAST:  154mL OMNIPAQUE IOHEXOL 300 MG/ML  SOLN COMPARISON:  03/09/2021 FINDINGS: Cardiovascular: Heart size remains normal. No pericardial fluid. Coronary artery calcification as seen previously. Aortic atherosclerotic calcification as seen previously. Mediastinum/Nodes: Normal Lungs/Pleura: Scarring/atelectasis at the lung bases left more than right unchanged since the previous study. Small areas of patchy density in the superior segment of the right lower lobe unchanged from the study of 4 days  ago. No new or progressive pulmonary finding. No pneumothorax. Upper Abdomen: No upper abdominal injury or acute finding. Musculoskeletal: There is increasing size of the right chest wall collections, primarily centered around the right sixth and eighth anterolateral ribs. Those ribs show a pattern of irregular destruction that look more like pathologic fractures due to infection rather than traumatic fractures. Air bubbles are now present within the collections. There is mild extrapleural involvement in side of the ribcage, without frank breakthrough into the pleural space. Most of the collections manifest external to the ribcage. No new areas of involvement are seen. IMPRESSION: Increasing size of the right chest wall collections, primarily centered around the right sixth and eighth anterolateral ribs, with development of air bubbles. Those ribs show a pattern of irregular destruction that have progressed and look more like pathologic fractures due to infection rather than traumatic fractures. Air bubbles are now present within the collections and they probably represent abscesses. There is mild extrapleural involvement on the inner side of the ribcage, without frank breakthrough into the pleural space. The majority of the collections are on the outer side of the ribcage. No new areas of involvement are seen. Case discussed with Dr. Regenia Skeeter at 1419 hours. Aortic Atherosclerosis (ICD10-I70.0). Electronically Signed   By: Nelson Chimes M.D.   On: 03/13/2021 14:19   CT Chest W Contrast  Result Date: 03/09/2021 CLINICAL DATA:  Fall 3 weeks prior with right rib fractures and persistent chest pain. EXAM: CT CHEST WITH CONTRAST TECHNIQUE: Multidetector CT imaging of the chest was performed during intravenous contrast administration. CONTRAST:  36mL OMNIPAQUE IOHEXOL 300 MG/ML  SOLN COMPARISON:  Chest radiograph from earlier today. 11/22/2019 chest CT angiogram. FINDINGS: Cardiovascular: Normal heart size. No  significant pericardial  effusion/thickening. Three-vessel coronary atherosclerosis. Atherosclerotic nonaneurysmal thoracic aorta. Top-normal caliber main pulmonary artery (3.0 cm diameter). No central pulmonary emboli. Mediastinum/Nodes: No discrete thyroid nodules. Unremarkable esophagus. No pathologically enlarged axillary, mediastinal or hilar lymph nodes. Lungs/Pleura: No pneumothorax. No pleural effusion. No acute consolidative airspace disease, lung masses or significant pulmonary nodules. Mild platelike atelectasis in the anterior basilar left lower lobe. Mild patchy ground-glass opacity in peripheral posterior right mid lung. Upper abdomen: No acute abnormality. Musculoskeletal: No aggressive appearing focal osseous lesions. Nondisplaced acute anterior right sixth, seventh and eighth rib fractures with surrounding chest wall hematoma measuring up to 8.1 x 4.9 cm in maximum axial dimensions at the level of the anterior right sixth rib fracture (series 2/image 114). Moderate thoracic spondylosis. IMPRESSION: 1. Nondisplaced acute anterior right sixth, seventh and eighth rib fractures with surrounding chest wall hematoma. No pneumothorax or hemothorax. No discrete bone lesions. Clinical follow-up advised to ensure resolution of the right chest wall hematoma. Any need for follow-up imaging should be based on clinical assessment. 2. Mild patchy ground-glass opacity in the peripheral posterior right mid lung, nonspecific, favor mild pulmonary contusion. 3. Mild platelike atelectasis at the left lung base. 4. Three-vessel coronary atherosclerosis. 5. Aortic Atherosclerosis (ICD10-I70.0). Electronically Signed   By: Ilona Sorrel M.D.   On: 03/09/2021 13:03   IR US Guide Bx Asp/Drain  Result Date: 03/14/2021 INDICATION: 70 year old with history of fall and right rib fractures. Patient developed right chest hematomas. Patient is complaining of pain and recent CT demonstrates gas within the hematomas. Findings are  concerning for infected hematomas. EXAM: PLACEMENT OF SUPERFICIAL RIGHT ANTERIOR CHEST WALL DRAIN USING ULTRASOUND GUIDANCE x 2 MEDICATIONS: Moderate sedation ANESTHESIA/SEDATION: Fentanyl 100 mcg IV; Versed 2.0 mg IV Moderate Sedation Time:  29 minutes The patient was continuously monitored during the procedure by the interventional radiology nurse under my direct supervision. COMPLICATIONS: None immediate. PROCEDURE: Informed written consent was obtained from the patient after a thorough discussion of the procedural risks, benefits and alternatives. All questions were addressed. Maximal Sterile Barrier Technique was utilized including caps, mask, sterile gowns, sterile gloves, sterile drape, hand hygiene and skin antiseptic. A timeout was performed prior to the initiation of the procedure. The right anterior chest wall hematomas were evaluated with ultrasound. Complex collections containing a small amount of fluid were obtained. The right anterior chest was prepped with chlorhexidine and sterile field was created. Attention was initially directed to the more inferior or caudal anterior chest wall collection. Skin was anesthetized with 1% lidocaine. Using ultrasound guidance, an 18 gauge trocar needle was directed into the collection and pink purulent fluid was aspirated. Superstiff Amplatz wire was advanced into the complex collection and the tract was dilated to accommodate a 10 [place] drain. Catheter was sutured to the skin and attached to a suction bulb. A sample of fluid was sent for culture. A second area more cephalad was targeted with ultrasound guidance. Skin was anesthetized with 1% lidocaine. Using ultrasound guidance, an 18 gauge trocar needle was directed into the complex collection. Again, purulent fluid was aspirated. Superstiff Amplatz wire was advanced into this collection and a 10 French drain was placed. Additional purulent fluid was aspirated and the catheter was sutured to skin and attached to  a suction bulb. Dressings were placed over both drains. FINDINGS: Patient has palpable hematomas in the right anterior chest and upper abdomen region. Ultrasound demonstrates heterogeneous collections in both areas containing a small amount of compressible fluid. There are echogenic areas within the collections compatible with  known gas. 10 French drains were placed in both areas and purulent fluid was draining from both collections. Both collections are very complex and compatible with infected hematomas. IMPRESSION: Ultrasound-guided placement of 2 percutaneous drains within the superficial right anterior chest wall collections. Findings are compatible with infected hematomas. Fluid sample was sent for culture. Electronically Signed   By: Markus Daft M.D.   On: 03/14/2021 18:37   IR US Guide Bx Asp/Drain  Result Date: 03/14/2021 INDICATION: 70 year old with history of fall and right rib fractures. Patient developed right chest hematomas. Patient is complaining of pain and recent CT demonstrates gas within the hematomas. Findings are concerning for infected hematomas. EXAM: PLACEMENT OF SUPERFICIAL RIGHT ANTERIOR CHEST WALL DRAIN USING ULTRASOUND GUIDANCE x 2 MEDICATIONS: Moderate sedation ANESTHESIA/SEDATION: Fentanyl 100 mcg IV; Versed 2.0 mg IV Moderate Sedation Time:  29 minutes The patient was continuously monitored during the procedure by the interventional radiology nurse under my direct supervision. COMPLICATIONS: None immediate. PROCEDURE: Informed written consent was obtained from the patient after a thorough discussion of the procedural risks, benefits and alternatives. All questions were addressed. Maximal Sterile Barrier Technique was utilized including caps, mask, sterile gowns, sterile gloves, sterile drape, hand hygiene and skin antiseptic. A timeout was performed prior to the initiation of the procedure. The right anterior chest wall hematomas were evaluated with ultrasound. Complex collections  containing a small amount of fluid were obtained. The right anterior chest was prepped with chlorhexidine and sterile field was created. Attention was initially directed to the more inferior or caudal anterior chest wall collection. Skin was anesthetized with 1% lidocaine. Using ultrasound guidance, an 18 gauge trocar needle was directed into the collection and pink purulent fluid was aspirated. Superstiff Amplatz wire was advanced into the complex collection and the tract was dilated to accommodate a 10 [place] drain. Catheter was sutured to the skin and attached to a suction bulb. A sample of fluid was sent for culture. A second area more cephalad was targeted with ultrasound guidance. Skin was anesthetized with 1% lidocaine. Using ultrasound guidance, an 18 gauge trocar needle was directed into the complex collection. Again, purulent fluid was aspirated. Superstiff Amplatz wire was advanced into this collection and a 10 French drain was placed. Additional purulent fluid was aspirated and the catheter was sutured to skin and attached to a suction bulb. Dressings were placed over both drains. FINDINGS: Patient has palpable hematomas in the right anterior chest and upper abdomen region. Ultrasound demonstrates heterogeneous collections in both areas containing a small amount of compressible fluid. There are echogenic areas within the collections compatible with known gas. 10 French drains were placed in both areas and purulent fluid was draining from both collections. Both collections are very complex and compatible with infected hematomas. IMPRESSION: Ultrasound-guided placement of 2 percutaneous drains within the superficial right anterior chest wall collections. Findings are compatible with infected hematomas. Fluid sample was sent for culture. Electronically Signed   By: Markus Daft M.D.   On: 03/14/2021 18:37   DG Chest Port 1 View  Result Date: 03/13/2021 CLINICAL DATA:  Sepsis, right rib pain. EXAM:  PORTABLE CHEST 1 VIEW COMPARISON:  March 09, 2021. FINDINGS: The heart size and mediastinal contours are within normal limits. Both lungs are clear. No pneumothorax or pleural effusion is noted. The visualized skeletal structures are unremarkable. IMPRESSION: No active disease. Electronically Signed   By: Marijo Conception M.D.   On: 03/13/2021 14:11   DG Chest Portable 1 View  Result Date:  03/09/2021 CLINICAL DATA:  Chest pain. Chest Arctic hurting late yesterday. Complains of right-sided sternal pain and chest pain. EXAM: PORTABLE CHEST 1 VIEW COMPARISON:  None. FINDINGS: The heart size and mediastinal contours are within normal limits. Both lungs are clear. Blunting of the left costophrenic angle is noted, new from prior studies. The visualized skeletal structures are unremarkable. IMPRESSION: New blunting of left costophrenic angle may reflect small effusion. The lungs are otherwise clear. No signs of interstitial edema. Electronically Signed   By: Kerby Moors M.D.   On: 03/09/2021 11:26   [place] IMAGE GUIDED DRAINAGE BY PERCUTANEOUS CATHETER  Result Date: 03/18/2021 INDICATION: History of right-sided rib fractures complicated by development of infected adjacent hematomas, post ultrasound-guided placement of two percutaneous drainage catheters on 03/14/2021. Unfortunately, one of the percutaneous drainage catheters was inadvertently removed with subsequent chest CT performed 03/17/2021 demonstrating a persistent undrained complex fluid collection involving the anterior aspect of the right chest wall. As such, request made for ultrasound-guided aspiration and/or drainage catheter placement for infection source control purposes. EXAM: 1. ULTRASOUND-GUIDED RIGHT CHEST WALL PERCUTANEOUS DRAINAGE CATHETER PLACEMENT X2 2. ULTRASOUND-GUIDED RIGHT CHEST WALL ABSCESS ASPIRATION COMPARISON:  Chest CT-03/17/2021; 03/13/2021; ultrasound-guided right chest wall percutaneous drainage catheter placement x2-03/14/2021  MEDICATIONS: The patient is currently admitted to the hospital and receiving intravenous antibiotics. The antibiotics were administered within an appropriate time frame prior to the initiation of the procedure. ANESTHESIA/SEDATION: Moderate (conscious) sedation was employed during this procedure. A total of Versed 4 mg and Fentanyl 200 mcg was administered intravenously. Moderate Sedation Time: 41 minutes. The patient's level of consciousness and vital signs were monitored continuously by radiology nursing throughout the procedure under my direct supervision. CONTRAST:  None COMPLICATIONS: None immediate. PROCEDURE: Informed written consent was obtained from the patient after a discussion of the risks, benefits and alternatives to treatment. Preprocedural ultrasound scanning demonstrated complex fluid involving the anterior aspect of the right chest wall compatible with the findings seen on preceding chest CT. A timeout was performed prior to the initiation of the procedure. The skin overlying the right anterior chest was prepped and draped in the usual sterile fashion. The overlying soft tissues were anesthetized with 1% lidocaine with epinephrine. Under direct ultrasound guidance, a 18 gauge trocar needle was advanced into the complex abscess within the right chest wall, inferior to the nipple. A short Amplatz wire was coiled within the collection. The track was dilated ultimately allowing placement of a 10 French percutaneous catheter. Multiple ultrasound images were saved procedural documentation purposes. Next, approximately 10 cc of purulent fluid was aspirated from the drainage catheter Sonographic evaluation demonstrates a residual complex fluid collection and as such the more medial component of the collection was accessed with an 18 gauge trocar needle. A short Amplatz wire was coiled within the collection and an additional 10 French percutaneous drainage catheter was placed under ultrasound guidance.  Multiple ultrasound images were saved procedural documentation purposes. Next, approximately 30 cc of purulent fluid was aspirated from this more medially positioned chest wall drain. A representative sample was capped and sent to the laboratory for analysis. Next, the lateral component of the right chest wall collection was accessed with an 18 gauge trocar needle however only a small amount of bloody fluid was able to be aspirated. As such, a short Amplatz wire was coiled within the collection and the trocar needle was exchanged for a Yueh sheath catheter which was utilized to aspirate approximately 3 cc of thick bloody fluid. At this time, approximately 55  cc of additional blood-tinged purulent fluid was able to be expressed from the site of the previously removed percutaneous drainage catheter. Drainage catheters were secured at the entrance site within interrupted sutures and drainage catheters were connected to JP bulbs. Dressings were applied. The patient tolerated the procedure well without immediate postprocedural complication. IMPRESSION: 1. Successful ultrasound-guided placement of 2 two additional right anterior chest wall percutaneous drainage catheters yielding a total of 40 cc of purulent fluid. A representative aspirated sample was sent to the laboratory for analysis. 2. Successful ultrasound-guided aspiration of approximately 3 cc of thick bloody fluid from the more lateral component of the right anterior chest wall complex hematoma. 3. Successful bedside expression of approximately 70 cc of purulent fluid from the entrance site of the recently removed percutaneous drainage catheter. Electronically Signed   By: Sandi Mariscal M.D.   On: 03/18/2021 15:29   [place] IMAGE GUIDED DRAINAGE BY PERCUTANEOUS CATHETER  Result Date: 03/18/2021 INDICATION: History of right-sided rib fractures complicated by development of infected adjacent hematomas, post ultrasound-guided placement of two percutaneous drainage  catheters on 03/14/2021. Unfortunately, one of the percutaneous drainage catheters was inadvertently removed with subsequent chest CT performed 03/17/2021 demonstrating a persistent undrained complex fluid collection involving the anterior aspect of the right chest wall. As such, request made for ultrasound-guided aspiration and/or drainage catheter placement for infection source control purposes. EXAM: 1. ULTRASOUND-GUIDED RIGHT CHEST WALL PERCUTANEOUS DRAINAGE CATHETER PLACEMENT X2 2. ULTRASOUND-GUIDED RIGHT CHEST WALL ABSCESS ASPIRATION COMPARISON:  Chest CT-03/17/2021; 03/13/2021; ultrasound-guided right chest wall percutaneous drainage catheter placement x2-03/14/2021 MEDICATIONS: The patient is currently admitted to the hospital and receiving intravenous antibiotics. The antibiotics were administered within an appropriate time frame prior to the initiation of the procedure. ANESTHESIA/SEDATION: Moderate (conscious) sedation was employed during this procedure. A total of Versed 4 mg and Fentanyl 200 mcg was administered intravenously. Moderate Sedation Time: 41 minutes. The patient's level of consciousness and vital signs were monitored continuously by radiology nursing throughout the procedure under my direct supervision. CONTRAST:  None COMPLICATIONS: None immediate. PROCEDURE: Informed written consent was obtained from the patient after a discussion of the risks, benefits and alternatives to treatment. Preprocedural ultrasound scanning demonstrated complex fluid involving the anterior aspect of the right chest wall compatible with the findings seen on preceding chest CT. A timeout was performed prior to the initiation of the procedure. The skin overlying the right anterior chest was prepped and draped in the usual sterile fashion. The overlying soft tissues were anesthetized with 1% lidocaine with epinephrine. Under direct ultrasound guidance, a 18 gauge trocar needle was advanced into the complex abscess  within the right chest wall, inferior to the nipple. A short Amplatz wire was coiled within the collection. The track was dilated ultimately allowing placement of a 10 French percutaneous catheter. Multiple ultrasound images were saved procedural documentation purposes. Next, approximately 10 cc of purulent fluid was aspirated from the drainage catheter Sonographic evaluation demonstrates a residual complex fluid collection and as such the more medial component of the collection was accessed with an 18 gauge trocar needle. A short Amplatz wire was coiled within the collection and an additional 10 French percutaneous drainage catheter was placed under ultrasound guidance. Multiple ultrasound images were saved procedural documentation purposes. Next, approximately 30 cc of purulent fluid was aspirated from this more medially positioned chest wall drain. A representative sample was capped and sent to the laboratory for analysis. Next, the lateral component of the right chest wall collection was accessed with an 18  gauge trocar needle however only a small amount of bloody fluid was able to be aspirated. As such, a short Amplatz wire was coiled within the collection and the trocar needle was exchanged for a Yueh sheath catheter which was utilized to aspirate approximately 3 cc of thick bloody fluid. At this time, approximately 70 cc of additional blood-tinged purulent fluid was able to be expressed from the site of the previously removed percutaneous drainage catheter. Drainage catheters were secured at the entrance site within interrupted sutures and drainage catheters were connected to JP bulbs. Dressings were applied. The patient tolerated the procedure well without immediate postprocedural complication. IMPRESSION: 1. Successful ultrasound-guided placement of 2 two additional right anterior chest wall percutaneous drainage catheters yielding a total of 40 cc of purulent fluid. A representative aspirated sample was  sent to the laboratory for analysis. 2. Successful ultrasound-guided aspiration of approximately 3 cc of thick bloody fluid from the more lateral component of the right anterior chest wall complex hematoma. 3. Successful bedside expression of approximately 70 cc of purulent fluid from the entrance site of the recently removed percutaneous drainage catheter. Electronically Signed   By: Sandi Mariscal M.D.   On: 03/18/2021 15:29   [place] FINE NEEDLE ASP 1ST LESION  Result Date: 03/18/2021 INDICATION: History of right-sided rib fractures complicated by development of infected adjacent hematomas, post ultrasound-guided placement of two percutaneous drainage catheters on 03/14/2021. Unfortunately, one of the percutaneous drainage catheters was inadvertently removed with subsequent chest CT performed 03/17/2021 demonstrating a persistent undrained complex fluid collection involving the anterior aspect of the right chest wall. As such, request made for ultrasound-guided aspiration and/or drainage catheter placement for infection source control purposes. EXAM: 1. ULTRASOUND-GUIDED RIGHT CHEST WALL PERCUTANEOUS DRAINAGE CATHETER PLACEMENT X2 2. ULTRASOUND-GUIDED RIGHT CHEST WALL ABSCESS ASPIRATION COMPARISON:  Chest CT-03/17/2021; 03/13/2021; ultrasound-guided right chest wall percutaneous drainage catheter placement x2-03/14/2021 MEDICATIONS: The patient is currently admitted to the hospital and receiving intravenous antibiotics. The antibiotics were administered within an appropriate time frame prior to the initiation of the procedure. ANESTHESIA/SEDATION: Moderate (conscious) sedation was employed during this procedure. A total of Versed 4 mg and Fentanyl 200 mcg was administered intravenously. Moderate Sedation Time: 41 minutes. The patient's level of consciousness and vital signs were monitored continuously by radiology nursing throughout the procedure under my direct supervision. CONTRAST:  None COMPLICATIONS: None  immediate. PROCEDURE: Informed written consent was obtained from the patient after a discussion of the risks, benefits and alternatives to treatment. Preprocedural ultrasound scanning demonstrated complex fluid involving the anterior aspect of the right chest wall compatible with the findings seen on preceding chest CT. A timeout was performed prior to the initiation of the procedure. The skin overlying the right anterior chest was prepped and draped in the usual sterile fashion. The overlying soft tissues were anesthetized with 1% lidocaine with epinephrine. Under direct ultrasound guidance, a 18 gauge trocar needle was advanced into the complex abscess within the right chest wall, inferior to the nipple. A short Amplatz wire was coiled within the collection. The track was dilated ultimately allowing placement of a 10 French percutaneous catheter. Multiple ultrasound images were saved procedural documentation purposes. Next, approximately 10 cc of purulent fluid was aspirated from the drainage catheter Sonographic evaluation demonstrates a residual complex fluid collection and as such the more medial component of the collection was accessed with an 18 gauge trocar needle. A short Amplatz wire was coiled within the collection and an additional 10 French percutaneous drainage catheter was placed under  ultrasound guidance. Multiple ultrasound images were saved procedural documentation purposes. Next, approximately 30 cc of purulent fluid was aspirated from this more medially positioned chest wall drain. A representative sample was capped and sent to the laboratory for analysis. Next, the lateral component of the right chest wall collection was accessed with an 18 gauge trocar needle however only a small amount of bloody fluid was able to be aspirated. As such, a short Amplatz wire was coiled within the collection and the trocar needle was exchanged for a Yueh sheath catheter which was utilized to aspirate  approximately 3 cc of thick bloody fluid. At this time, approximately 70 cc of additional blood-tinged purulent fluid was able to be expressed from the site of the previously removed percutaneous drainage catheter. Drainage catheters were secured at the entrance site within interrupted sutures and drainage catheters were connected to JP bulbs. Dressings were applied. The patient tolerated the procedure well without immediate postprocedural complication. IMPRESSION: 1. Successful ultrasound-guided placement of 2 two additional right anterior chest wall percutaneous drainage catheters yielding a total of 40 cc of purulent fluid. A representative aspirated sample was sent to the laboratory for analysis. 2. Successful ultrasound-guided aspiration of approximately 3 cc of thick bloody fluid from the more lateral component of the right anterior chest wall complex hematoma. 3. Successful bedside expression of approximately 70 cc of purulent fluid from the entrance site of the recently removed percutaneous drainage catheter. Electronically Signed   By: Sandi Mariscal M.D.   On: 03/18/2021 15:29

## 2021-03-20 NOTE — Progress Notes (Signed)
Interventional Radiology Brief Note:  Patient s/p OR with Dr. Kipp Brood yesterday for abscess evacuation with wound vac placement.  All IR drains were removed in the OR.   IR signing off.  Please re-consult if needed.   Brynda Greathouse, MS RD PA-C

## 2021-03-21 DIAGNOSIS — A419 Sepsis, unspecified organism: Secondary | ICD-10-CM | POA: Diagnosis not present

## 2021-03-21 DIAGNOSIS — N179 Acute kidney failure, unspecified: Secondary | ICD-10-CM | POA: Diagnosis not present

## 2021-03-21 DIAGNOSIS — R6520 Severe sepsis without septic shock: Secondary | ICD-10-CM | POA: Diagnosis not present

## 2021-03-21 LAB — COMPREHENSIVE METABOLIC PANEL
ALT: 9 U/L (ref 0–44)
AST: 14 U/L — ABNORMAL LOW (ref 15–41)
Albumin: 2.1 g/dL — ABNORMAL LOW (ref 3.5–5.0)
Alkaline Phosphatase: 81 U/L (ref 38–126)
Anion gap: 8 (ref 5–15)
BUN: 23 mg/dL (ref 8–23)
CO2: 27 mmol/L (ref 22–32)
Calcium: 8.3 mg/dL — ABNORMAL LOW (ref 8.9–10.3)
Chloride: 97 mmol/L — ABNORMAL LOW (ref 98–111)
Creatinine, Ser: 1.21 mg/dL (ref 0.61–1.24)
GFR, Estimated: >60 mL/min (ref >60)
Glucose, Bld: 111 mg/dL — ABNORMAL HIGH (ref 70–99)
Potassium: 4.3 mmol/L (ref 3.5–5.1)
Sodium: 132 mmol/L — ABNORMAL LOW (ref 135–145)
Total Bilirubin: 0.6 mg/dL (ref 0.3–1.2)
Total Protein: 6.1 g/dL — ABNORMAL LOW (ref 6.5–8.1)

## 2021-03-21 LAB — CBC WITH DIFFERENTIAL/PLATELET
Abs Immature Granulocytes: 0.06 10*3/uL (ref 0.00–0.07)
Basophils Absolute: 0 10*3/uL (ref 0.0–0.1)
Basophils Relative: 0 %
Eosinophils Absolute: 0.1 10*3/uL (ref 0.0–0.5)
Eosinophils Relative: 1 %
HCT: 29.8 % — ABNORMAL LOW (ref 39.0–52.0)
Hemoglobin: 10.1 g/dL — ABNORMAL LOW (ref 13.0–17.0)
Immature Granulocytes: 1 %
Lymphocytes Relative: 19 %
Lymphs Abs: 1.7 10*3/uL (ref 0.7–4.0)
MCH: 32.7 pg (ref 26.0–34.0)
MCHC: 33.9 g/dL (ref 30.0–36.0)
MCV: 96.4 fL (ref 80.0–100.0)
Monocytes Absolute: 0.8 10*3/uL (ref 0.1–1.0)
Monocytes Relative: 10 %
Neutro Abs: 6.1 10*3/uL (ref 1.7–7.7)
Neutrophils Relative %: 69 %
Platelets: 289 10*3/uL (ref 150–400)
RBC: 3.09 MIL/uL — ABNORMAL LOW (ref 4.22–5.81)
RDW: 12.9 % (ref 11.5–15.5)
WBC: 8.8 10*3/uL (ref 4.0–10.5)
nRBC: 0 % (ref 0.0–0.2)

## 2021-03-21 LAB — PROCALCITONIN: Procalcitonin: 0.1 ng/mL

## 2021-03-21 LAB — VANCOMYCIN, TROUGH: Vancomycin Tr: 17 ug/mL (ref 15–20)

## 2021-03-21 LAB — GLUCOSE, CAPILLARY
Glucose-Capillary: 142 mg/dL — ABNORMAL HIGH (ref 70–99)
Glucose-Capillary: 117 mg/dL — ABNORMAL HIGH (ref 70–99)
Glucose-Capillary: 144 mg/dL — ABNORMAL HIGH (ref 70–99)
Glucose-Capillary: 167 mg/dL — ABNORMAL HIGH (ref 70–99)
Glucose-Capillary: 121 mg/dL — ABNORMAL HIGH (ref 70–99)

## 2021-03-21 LAB — MAGNESIUM: Magnesium: 2 mg/dL (ref 1.7–2.4)

## 2021-03-21 MED ORDER — VANCOMYCIN HCL 1250 MG/250ML IV SOLN
1250.0000 mg | INTRAVENOUS | Status: DC
  Administered 2021-03-22: 1250 mg via INTRAVENOUS
  Filled 2021-03-21 (×3): qty 250

## 2021-03-21 NOTE — Progress Notes (Signed)
Pharmacy Antibiotic Note  Brian Malone is a 70 y.o. male on day #6 Vancomcyin for MRSA chest wall abscess/infected hematoma. S/p drain placement in IR 3/26, patient subsequently pulled one drain. Replacement of drain in IR today. Gram stain from today's same with abundat GPC in clusters. Planning I&D in [place] on 03/19/21.  Steady state Vancomycin levels drawn - peak of 40 and trough of 17.  AUC 649 which is above the desired AUC of 400 - 550.  Vancomycin 1250 mg IV Q 24 hrs. Goal AUC 400-550. Expected AUC: 541  Plan: Decrease Vancomycin 1250 mg IV q24h. Will follow renal function, culture data and clinical progress. Vancomycin levels when appropriate.  Height: 6' (182.9 cm) Weight: 85.2 kg (187 lb 13.3 oz) IBW/kg (Calculated) : 77.6  Temp (24hrs), Avg:97.9 F (36.6 C), Min:97.8 F (36.6 C), Max:97.9 F (36.6 C)  Recent Labs  Lab 03/17/21 0116 03/18/21 0151 03/19/21 0103 03/20/21 0036 03/20/21 1514 03/21/21 0252 03/21/21 1119  WBC 9.2 8.0 9.4 7.8  --  8.8  --   CREATININE 1.15 1.10 1.13 1.27*  --  1.21  --   VANCOTROUGH  --   --   --   --   --   --  17  VANCOPEAK  --   --   --   --  40  --   --     Estimated Creatinine Clearance: 65 mL/min (by C-G formula based on SCr of 1.21 mg/dL).    Allergies  Allergen Reactions  . Adhesive [Tape] Other (See Comments)    "Tape will take off my skin, as will Band-Aids"  . Cyclobenzaprine Other (See Comments)    Hallucinations  . Duloxetine Hcl Other (See Comments)    Makes PTSD- induced nightmares more vivid   . Morphine Itching and Other (See Comments)    Hallucinations, aggression, and makes patient altered also      Antimicrobials this admission: Vancomycin 3/25>> Zosyn x 2 on 3/25 Meropenem 3/25>3/28 Clindamcyin x 1 3/25 CH/Bactroban 3/26>>3/30  Dose adjustments this admission:  3/27: empiric Vanc 1250 > 1500 mg q24h d/t renal fxn 4/2 - Vacn 1550 > 1250 based on 2 level kinetics  Microbiology results: 3/25 MRSA  PCR: positive 3/25 urine: neg 3/25 Blood x 2: negative 3/26 abscess: MRSA , MIC < 0.5 3/25 covid and flu: neg 3/30 abscess, chest wall drain: ab GPC in clusters  Thank you for allowing pharmacy to be a part of this patient's care.  Alanda Slim, PharmD, [facility] Clinical Pharmacist Please see AMION for all Pharmacists' Contact Phone Numbers 03/21/2021, 1:22 PM

## 2021-03-21 NOTE — Progress Notes (Signed)
PROGRESS NOTE                                                                                                                                                                                                             Patient Demographics:    Brian Malone, is a 70 y.o. male, DOB - 12-Feb-1951, WSF:[id number]  Outpatient Primary MD for the patient is [name], [name], MD    LOS - 8  Admit date - 03/13/2021    Chief Complaint  Patient presents with  . Rib Injury       Brief Narrative (HPI from H&P)  70 year old male with past medical history of hepatitis C, hypertension, hyperlipidemia, diabetes mellitus type 2, chronic pain syndrome, intravenous drug use (abstinent since 2019), major depressive disorder and HIV who had a trip and fall in his yard on 02/22/2021 after which he hurt his right-sided chest wall, at that time he was diagnosed with right-sided rib cage fracture and injury, he was seen in the ER a few times since then and was treated conservatively, his pain and discomfort continued to get worse and he presented again to [facility] on  03/13/2021 CT scan suggested that he had right chest wall abscess formation and cellulitis with concerns for sepsis.   Subjective:   Patient in bed, appears comfortable, denies any headache, no fever, +ve R. Chest wall pain, , no shortness of breath , no abdominal pain. No new focal weakness.   Assessment  & Plan :     1. Sepsis due to right-sided chest wall abscess formation after mechanical fall and multiple right rib fractures sustained on 02/22/2021 - he being treated with empiric IV antibiotics along with IV fluids, currently sepsis pathophysiology has improved, Dr. Kipp Brood cardiothoracic surgery was consulted in the ER who suggested ultrasound-guided drainage of the abscess by IR, this was done by IR on 03/14/2021, infected  hematoma/abscess which was drained, fluid growing MRSA .  Despite having drains placed by IR his infection continue to get worse, eventually he was taken to the OR by Dr. Kipp Brood on 03/19/2021, he had some postop bleeding from the site which was stabilized by cardiothoracic surgery tonight 03/09/2021, now has a wound VAC in place and clinically looks better.  Continue vancomycin for now and monitor.  Encouraged the patient to sit up in  chair in the daytime use I-S and flutter valve for pulmonary toiletry.  Will advance activity and titrate down oxygen as possible.   2.  Chronic pain and narcotic use.  Supportive care as needed for acute discomfort, avoid overuse, as needed Narcan added.  Patient counseled see above.  He does exhibit narcotic seeking behavior, has been warned multiple times as this can lead to accidental overdose and even death friend in the room on 03/29/2021 also made aware of the concerns. Kindly see nursing documentation as well.   3.  Continue home regimen follows with [facility] ID department.  4.  Anxiety and depression.  Home medications continued.  Currently not homicidal or suicidal.  5.  GERD.  On PPI.  6.  BPH.  On Flomax.  7.  AKI.  Improved after IV fluids likely due to ATN from sepsis.  8.  Hypertension.  Blood pressure soft, skipped today's blood pressure medications will monitor closely.   9.  DM type II.  On sliding scale monitor and adjust  Lab Results  Component Value Date   HGBA1C 8.3 (H) 03/14/2021   CBG (last 3)  Recent Labs    03/20/21 2108 03/21/21 0613 03/21/21 0736  GLUCAP 184* 142* 117*          Condition - Extremely Guarded  Family Communication  : None present bedside  Code Status :  Full  Consults  :  IR  PUD Prophylaxis : PPI   Procedures  :     Right chest wall I&D by Dr. Kipp Brood 03/19/21  US guided chest wall abscess drined by IR 03/14/21   CT -  Increasing size of the right chest wall collections, primarily  centered around the right sixth and eighth anterolateral ribs, with development of air bubbles. Those ribs show a pattern of irregular destruction that have progressed and look more like pathologic fractures due to infection rather than traumatic fractures. Air bubbles are now present within the collections and they probably represent abscesses. There is mild extrapleural involvement on the inner side of the ribcage, without frank breakthrough into the pleural space. The majority of the collections are on the outer side of the ribcage. No new areas of involvement are seen.  CT repeat 03/17/21 - 1. Interval decrease in size of bilobed right anterolateral chest wall fluid collection. The inferior lobulation is nearly completely resolved with some fat stranding and subcutaneous emphysema remaining. The more superior lobulation has decreased in size since prior study from 03/13/2021. The overall size of the collections now measures 7.4 x 7.1 x 3.7 cm compared to 10.1 x 9.1 x 5.7 cm on 03/13/2021. 2. Lucency in the anterior segments of the right sixth and seventh ribs again seen. Findings are suspicious for osteomyelitis, however underlying pathologic fracture is also possible.      Disposition Plan  :    Status is: Inpatient  Remains inpatient appropriate because:IV treatments appropriate due to intensity of illness or inability to take PO   Dispo: The patient is from: Home              Anticipated d/c is to: Home              Patient currently is not medically stable to d/c.   Difficult to place patient No  DVT Prophylaxis  :   Heparin   Lab Results  Component Value Date   PLT 289 03/21/2021    Diet :  Diet Order  Diet regular Room service appropriate? Yes; Fluid consistency: Thin  Diet effective now                  Inpatient Medications  Scheduled Meds: . (feeding supplement) PROSource Plus  30 mL Oral TID WC  . abacavir-dolutegravir-lamiVUDine  1 tablet Oral Daily  .  busPIRone  30 mg Oral BID  . clonazepam  0.5 mg Oral TID  . ezetimibe  10 mg Oral Daily  . feeding supplement  237 mL Oral BID BM  . feeding supplement (GLUCERNA SHAKE)  237 mL Oral Q24H  . Glecaprevir-Pibrentasvir  3 tablet Oral Daily  . heparin injection (subcutaneous)  5,000 Units Subcutaneous Q8H  . insulin aspart  0-15 Units Subcutaneous TID AC & HS  . metoprolol tartrate  25 mg Oral BID  . multivitamin with minerals  1 tablet Oral Daily  . pantoprazole  40 mg Oral QAC breakfast  . sodium chloride flush  5 mL Intracatheter Q8H  . sodium polystyrene  30 g Oral Once  . tamsulosin  0.4 mg Oral QHS  . tiZANidine  2 mg Oral QHS   Continuous Infusions: . sodium chloride Stopped (03/13/21 2106)  . vancomycin 1,500 mg (03/20/21 1239)   PRN Meds:.sodium chloride, acetaminophen **OR** [DISCONTINUED] acetaminophen, antiseptic oral rinse, HYDROcodone-acetaminophen, HYDROmorphone (DILAUDID) injection **OR** [DISCONTINUED]  HYDROmorphone (DILAUDID) injection, naLOXone (NARCAN)  injection, [DISCONTINUED] ondansetron **OR** ondansetron (ZOFRAN) IV, polyethylene glycol, valACYclovir  Antibiotics  :    Anti-infectives (From admission, onward)   Start     Dose/Rate Route Frequency Ordered Stop   03/20/21 1000  Glecaprevir-Pibrentasivir 100-40 mg (Mavyret) tabs 3 tablets - patient's own supply        3 tablet Oral Daily 03/20/21 0733     03/15/21 1200  vancomycin (VANCOREADY) IVPB 1500 mg/300 mL        1,500 mg 150 mL/hr over 120 Minutes Intravenous Every 24 hours 03/15/21 0732     03/14/21 1200  vancomycin (VANCOREADY) IVPB 1250 mg/250 mL  Status:  Discontinued        1,250 mg 166.7 mL/hr over 90 Minutes Intravenous Every 24 hours 03/13/21 1359 03/15/21 0732   03/14/21 1023  valACYclovir (VALTREX) tablet 1,000 mg       Note to Pharmacy: Trig neuralgia flare     1,000 mg Oral 3 times daily PRN 03/14/21 1023     03/14/21 1000  abacavir-dolutegravir-lamiVUDine (TRIUMEQ) 600-50-300 MG per tablet 1  tablet        1 tablet Oral Daily 03/13/21 2320     03/14/21 0200  meropenem (MERREM) 1 g in sodium chloride 0.9 % 100 mL IVPB  Status:  Discontinued        1 g 200 mL/hr over 30 Minutes Intravenous Every 8 hours 03/13/21 2337 03/16/21 0719   03/14/21 0100  ceFEPIme (MAXIPIME) 2 g in sodium chloride 0.9 % 100 mL IVPB  Status:  Discontinued       Note to Pharmacy: Cefepime 2 g IV q12h for CrCl < 60 mL/min   2 g 200 mL/hr over 30 Minutes Intravenous Every 12 hours 03/13/21 2326 03/13/21 2331   03/13/21 2000  piperacillin-tazobactam (ZOSYN) IVPB 3.375 g  Status:  Discontinued        3.375 g 12.5 mL/hr over 240 Minutes Intravenous Every 8 hours 03/13/21 1402 03/13/21 2320   03/13/21 1430  clindamycin (CLEOCIN) IVPB 900 mg        900 mg 100 mL/hr over 30 Minutes Intravenous  Once 03/13/21 1418  03/13/21 1548   03/13/21 1230  vancomycin (VANCOCIN) IVPB 1000 mg/200 mL premix        1,000 mg 200 mL/hr over 60 Minutes Intravenous  Once 03/13/21 1229 03/13/21 1506   03/13/21 1230  piperacillin-tazobactam (ZOSYN) IVPB 3.375 g        3.375 g 100 mL/hr over 30 Minutes Intravenous  Once 03/13/21 1229 03/13/21 1400       Time Spent in minutes  30   Lala Lund M.D on 03/21/2021 at 11:14 AM  To page go to [email or web address]   [facility] -  Office  [phone]    See all Orders from today for further details    Objective:   Vitals:   03/20/21 0236 03/20/21 1315 03/20/21 2106 03/21/21 0614  BP: 132/84 121/76 (!) 150/83 135/74  Pulse: 93 76 81 63  Resp:  20 19 12   Temp: 98.2 F (36.8 C) 97.6 F (36.4 C) 97.8 F (36.6 C) 97.9 F (36.6 C)  TempSrc: Oral Oral Axillary Axillary  SpO2:  99% 100% 100%  Weight:      Height:        Wt Readings from Last 3 Encounters:  03/19/21 85.2 kg  03/09/21 77.1 kg  01/22/21 72.6 kg     Intake/Output Summary (Last 24 hours) at 03/21/2021 1114 Last data filed at 03/21/2021 0237 Gross per 24 hour  Intake 120 ml  Output 1625 ml  Net -1505 ml    Physical Exam  Awake Alert, No new F.N deficits, Normal affect [place].AT,PERRAL Supple Neck,No JVD, No cervical lymphadenopathy appriciated.  Symmetrical Chest wall movement, Good air movement bilaterally, CTAB RRR,No Gallops, Rubs or new Murmurs, No Parasternal Heave +ve B.Sounds, Abd Soft, No tenderness, No organomegaly appriciated, No rebound - guarding or rigidity. No Cyanosis, Clubbing or edema, No new Rash or bruise    Data Review:    CBC Recent Labs  Lab 03/17/21 0116 03/18/21 0151 03/19/21 0103 03/20/21 0036 03/21/21 0252  WBC 9.2 8.0 9.4 7.8 8.8  HGB 10.0* 10.2* 11.4* 11.2* 10.1*  HCT 29.6* 29.8* 33.5* 33.6* 29.8*  PLT 232 206 214 234 289  MCV 96.4 96.1 95.7 95.7 96.4  MCH 32.6 32.9 32.6 31.9 32.7  MCHC 33.8 34.2 34.0 33.3 33.9  RDW 13.0 12.9 12.8 12.8 12.9  LYMPHSABS 0.9 1.1 1.1 0.4* 1.7  MONOABS 1.1* 1.0 1.1* 0.3 0.8  EOSABS 0.1 0.1 0.1 0.0 0.1  BASOSABS 0.0 0.0 0.0 0.0 0.0    Recent Labs  Lab 03/17/21 0116 03/18/21 0151 03/19/21 0103 03/20/21 0036 03/21/21 0252  NA 134* 133* 131* 132* 132*  K 3.5 3.8 4.4 5.2* 4.3  CL 102 100 97* 97* 97*  CO2 24 26 29 25 27   GLUCOSE 198* 104* 124* 266* 111*  BUN 14 10 8 16 23   CREATININE 1.15 1.10 1.13 1.27* 1.21  CALCIUM 7.7* 8.1* 8.2* 8.3* 8.3*  AST 12* 13* 13* 13* 14*  ALT 6 8 8 11 9   ALKPHOS 98 82 90 87 81  BILITOT 0.6 0.6 0.9 0.7 0.6  ALBUMIN 1.6* 1.7* 1.8* 1.9* 2.1*  MG 1.6* 1.7 1.7 1.7 2.0  PROCALCITON 0.35 0.26 0.27 0.12 0.10    ------------------------------------------------------------------------------------------------------------------ No results for input(s): CHOL, HDL, LDLCALC, TRIG, CHOLHDL, LDLDIRECT in the last 72 hours.  Lab Results  Component Value Date   HGBA1C 8.3 (H) 03/14/2021   ------------------------------------------------------------------------------------------------------------------ No results for input(s): TSH, T4TOTAL, T3FREE, THYROIDAB in the last 72 hours.  Invalid  input(s): FREET3  Cardiac Enzymes No results  for input(s): CKMB, TROPONINI, MYOGLOBIN in the last 168 hours.  Invalid input(s): CK ------------------------------------------------------------------------------------------------------------------ No results found for: BNP  Micro Results Recent Results (from the past 240 hour(s))  Urine culture     Status: None   Collection Time: 03/13/21 12:25 AM   Specimen: In/Out Cath Urine  Result Value Ref Range Status   Specimen Description IN/OUT CATH URINE  Final   Special Requests NONE  Final   Culture   Final    NO GROWTH Performed at [facility], [address] [place], [place] [address]    Report Status 03/15/2021 FINAL  Final  Blood Culture (routine x 2)     Status: None   Collection Time: 03/13/21 12:25 PM   Specimen: Right Antecubital; Blood  Result Value Ref Range Status   Specimen Description   Final    RIGHT ANTECUBITAL BLOOD Performed at [facility], [address] [place], [place] [address]    Special Requests   Final    BOTTLES DRAWN AEROBIC AND ANAEROBIC Blood Culture results may not be optimal due to an inadequate volume of blood received in culture bottles Performed at [facility], [address] [place], [place] [address]    Culture   Final    NO GROWTH 5 DAYS Performed at [facility], [place] [address] [place], [place] [address]    Report Status 03/18/2021 FINAL  Final  Blood Culture (routine x 2)     Status: None   Collection Time: 03/13/21  1:15 PM   Specimen: BLOOD RIGHT HAND  Result Value Ref Range Status   Specimen Description   Final    BLOOD RIGHT HAND BLOOD Performed at [facility], [address] [place], [place] [address]    Special Requests   Final    BOTTLES DRAWN AEROBIC AND ANAEROBIC Blood Culture results may not be optimal due to an inadequate volume of blood received in culture bottles Performed at [facility], [address]  [place], [place] [address]    Culture   Final    NO GROWTH 5 DAYS Performed at [facility], [place] [address], [place],  [address]    Report Status 03/18/2021 FINAL  Final  Resp Panel by RT-PCR (Flu A&B, Covid) Nasopharyngeal Swab     Status: None   Collection Time: 03/13/21  2:01 PM   Specimen: Nasopharyngeal Swab; Nasopharyngeal(NP) swabs in vial transport medium  Result Value Ref Range Status   SARS Coronavirus 2 by RT PCR NEGATIVE NEGATIVE Final    Comment: (NOTE) SARS-CoV-2 target nucleic acids are NOT DETECTED.  The SARS-CoV-2 RNA is generally detectable in upper respiratory specimens during the acute phase of infection. The lowest concentration of SARS-CoV-2 viral copies this assay can detect is 138 copies/mL. A negative result does not preclude SARS-Cov-2 infection and should not be used as the sole basis for treatment or other patient management decisions. A negative result may occur with  improper specimen collection/handling, submission of specimen other than nasopharyngeal swab, presence of viral mutation(s) within the areas targeted by this assay, and inadequate number of viral copies(<138 copies/mL). A negative result must be combined with clinical observations, patient history, and epidemiological information. The expected result is Negative.  Fact Sheet for Patients:  EntrepreneurPulse.com.au  Fact Sheet for Healthcare Providers:  IncredibleEmployment.be  This test is no t yet approved or cleared by the [place] FDA and  has  been authorized for detection and/or diagnosis of SARS-CoV-2 by FDA under an Emergency Use Authorization (EUA). This EUA will remain  in effect (meaning this test can be used) for the duration of the COVID-19 declaration under Section 564(b)(1) of the Act, 21 U.S.C.section 360bbb-3(b)(1), unless the authorization is terminated  or revoked sooner.       Influenza A by PCR NEGATIVE NEGATIVE  Final   Influenza B by PCR NEGATIVE NEGATIVE Final    Comment: (NOTE) The Xpert Xpress SARS-CoV-2/FLU/RSV plus assay is intended as an aid in the diagnosis of influenza from Nasopharyngeal swab specimens and should not be used as a sole basis for treatment. Nasal washings and aspirates are unacceptable for Xpert Xpress SARS-CoV-2/FLU/RSV testing.  Fact Sheet for Patients: EntrepreneurPulse.com.au  Fact Sheet for Healthcare Providers: IncredibleEmployment.be  This test is not yet approved or cleared by the [place] FDA and has been authorized for detection and/or diagnosis of SARS-CoV-2 by FDA under an Emergency Use Authorization (EUA). This EUA will remain in effect (meaning this test can be used) for the duration of the COVID-19 declaration under Section 564(b)(1) of the Act, 21 U.S.C. section 360bbb-3(b)(1), unless the authorization is terminated or revoked.  Performed at [facility], [address] [place], [place] [address]   MRSA PCR Screening     Status: Abnormal   Collection Time: 03/13/21 11:51 PM   Specimen: Nasal Mucosa; Nasopharyngeal  Result Value Ref Range Status   MRSA by PCR POSITIVE (A) NEGATIVE Final    Comment:        The GeneXpert MRSA Assay (FDA approved for NASAL specimens only), is one component of a comprehensive MRSA colonization surveillance program. It is not intended to diagnose MRSA infection nor to guide or monitor treatment for MRSA infections. RESULT CALLED TO, READ BACK BY AND VERIFIED WITHDorna Bloom RN 03/14/21 0432 JDW Performed at [facility] [address], [place], [place] [address]   Aerobic/Anaerobic Culture (surgical/deep wound)     Status: None   Collection Time: 03/14/21  1:23 PM   Specimen: Abscess  Result Value Ref Range Status   Specimen Description ABSCESS  Final   Special Requests NONE  Final   Gram Stain   Final    ABUNDANT WBC PRESENT, PREDOMINANTLY  PMN ABUNDANT GRAM POSITIVE COCCI IN CLUSTERS    Culture   Final    ABUNDANT METHICILLIN RESISTANT STAPHYLOCOCCUS AUREUS NO ANAEROBES ISOLATED Performed at [facility], [address] [place], [place] [address]    Report Status 03/19/2021 FINAL  Final   Organism ID, Bacteria METHICILLIN RESISTANT STAPHYLOCOCCUS AUREUS  Final      Susceptibility   Methicillin resistant staphylococcus aureus - MIC*    CIPROFLOXACIN >=8 RESISTANT Resistant     ERYTHROMYCIN >=8 RESISTANT Resistant     GENTAMICIN <=0.5 SENSITIVE Sensitive     OXACILLIN >=4 RESISTANT Resistant     TETRACYCLINE <=1 SENSITIVE Sensitive     VANCOMYCIN <=0.5 SENSITIVE Sensitive     TRIMETH/SULFA >=320 RESISTANT Resistant     CLINDAMYCIN >=8 RESISTANT Resistant     RIFAMPIN <=0.5 SENSITIVE Sensitive     Inducible Clindamycin NEGATIVE Sensitive     * ABUNDANT METHICILLIN RESISTANT STAPHYLOCOCCUS AUREUS  Aerobic/Anaerobic Culture w Gram Stain (surgical/deep wound)     Status: None (Preliminary result)   Collection Time: 03/18/21  1:38 PM   Specimen: Abscess  Result Value Ref Range Status   Specimen Description ABSCESS  Final   Special Requests  DRAIN CHEST WALL  Final   Gram Stain   Final    ABUNDANT WBC PRESENT, PREDOMINANTLY PMN ABUNDANT GRAM POSITIVE COCCI IN CLUSTERS Performed at [facility], [place] [address], [place], [place] [address]    Culture   Final    ABUNDANT METHICILLIN RESISTANT STAPHYLOCOCCUS AUREUS NO ANAEROBES ISOLATED; CULTURE IN PROGRESS FOR 5 DAYS    Report Status PENDING  Incomplete   Organism ID, Bacteria METHICILLIN RESISTANT STAPHYLOCOCCUS AUREUS  Final      Susceptibility   Methicillin resistant staphylococcus aureus - MIC*    CIPROFLOXACIN >=8 RESISTANT Resistant     ERYTHROMYCIN >=8 RESISTANT Resistant     GENTAMICIN <=0.5 SENSITIVE Sensitive     OXACILLIN >=4 RESISTANT Resistant     TETRACYCLINE <=1 SENSITIVE Sensitive     VANCOMYCIN <=0.5 SENSITIVE Sensitive     TRIMETH/SULFA  >=320 RESISTANT Resistant     CLINDAMYCIN >=8 RESISTANT Resistant     RIFAMPIN <=0.5 SENSITIVE Sensitive     Inducible Clindamycin NEGATIVE Sensitive     * ABUNDANT METHICILLIN RESISTANT STAPHYLOCOCCUS AUREUS  Aerobic/Anaerobic Culture w Gram Stain (surgical/deep wound)     Status: None (Preliminary result)   Collection Time: 03/19/21  3:07 PM   Specimen: Wound; Abscess  Result Value Ref Range Status   Specimen Description ABSCESS RIGHT CHEST  Final   Special Requests NONE  Final   Gram Stain   Final    FEW WBC PRESENT, PREDOMINANTLY PMN FEW GRAM POSITIVE COCCI IN PAIRS IN CLUSTERS Performed at [facility], [address] [place], [place] [address]    Culture FEW STAPHYLOCOCCUS AUREUS  Final   Report Status PENDING  Incomplete    Radiology Reports CT CHEST W CONTRAST  Result Date: 03/17/2021 CLINICAL DATA:  Infected right chest wall hematoma EXAM: CT CHEST WITH CONTRAST TECHNIQUE: Multidetector CT imaging of the chest was performed during intravenous contrast administration. CONTRAST:  75 mL OMNIPAQUE IOHEXOL 350 MG/ML SOLN COMPARISON:  03/13/2021 FINDINGS: Cardiovascular: Heart size within normal limits. Coronary artery calcifications seen throughout. No significant vascular abnormality identified. Mediastinum/Nodes: No enlarged mediastinal, hilar, or axillary lymph nodes. Lungs/Pleura: Trace bilateral pleural effusions with adjacent atelectasis. Upper Abdomen: No acute abnormality. Musculoskeletal: Previously seen right chest wall collection has decreased in size since prior examination, measuring approximately 7.4 x 7.1 x 3.7 cm compared to 10.1 x 9.1 x 5.7 cm on prior exam from 03/13/2021. The collection is somewhat bilobed. The inferior portion of the right chest wall collection has decreased to a greater degree than the superior lobulation. Greater amount of air within the collection likely due to presence of drains. Lucency and irregularity of the right sixth and seventh ribs,  anterior segment again noted. IMPRESSION: 1. Interval decrease in size of bilobed right anterolateral chest wall fluid collection. The inferior lobulation is nearly completely resolved with some fat stranding and subcutaneous emphysema remaining. The more superior lobulation has decreased in size since prior study from 03/13/2021. The overall size of the collections now measures 7.4 x 7.1 x 3.7 cm compared to 10.1 x 9.1 x 5.7 cm on 03/13/2021. 2. Lucency in the anterior segments of the right sixth and seventh ribs again seen. Findings are suspicious for osteomyelitis, however underlying pathologic fracture is also possible. Electronically Signed   By: Miachel Roux M.D.   On: 03/17/2021 14:05   CT Chest W Contrast  Result Date: 03/13/2021 CLINICAL DATA:  Follow-up right chest wall hematoma and fractures of the right sixth through eighth ribs.  Diabetes. HIV. EXAM: CT CHEST WITH CONTRAST TECHNIQUE: Multidetector CT imaging of the chest was performed during intravenous contrast administration. CONTRAST:  142mL OMNIPAQUE IOHEXOL 300 MG/ML  SOLN COMPARISON:  03/09/2021 FINDINGS: Cardiovascular: Heart size remains normal. No pericardial fluid. Coronary artery calcification as seen previously. Aortic atherosclerotic calcification as seen previously. Mediastinum/Nodes: Normal Lungs/Pleura: Scarring/atelectasis at the lung bases left more than right unchanged since the previous study. Small areas of patchy density in the superior segment of the right lower lobe unchanged from the study of 4 days ago. No new or progressive pulmonary finding. No pneumothorax. Upper Abdomen: No upper abdominal injury or acute finding. Musculoskeletal: There is increasing size of the right chest wall collections, primarily centered around the right sixth and eighth anterolateral ribs. Those ribs show a pattern of irregular destruction that look more like pathologic fractures due to infection rather than traumatic fractures. Air bubbles are  now present within the collections. There is mild extrapleural involvement in side of the ribcage, without frank breakthrough into the pleural space. Most of the collections manifest external to the ribcage. No new areas of involvement are seen. IMPRESSION: Increasing size of the right chest wall collections, primarily centered around the right sixth and eighth anterolateral ribs, with development of air bubbles. Those ribs show a pattern of irregular destruction that have progressed and look more like pathologic fractures due to infection rather than traumatic fractures. Air bubbles are now present within the collections and they probably represent abscesses. There is mild extrapleural involvement on the inner side of the ribcage, without frank breakthrough into the pleural space. The majority of the collections are on the outer side of the ribcage. No new areas of involvement are seen. Case discussed with Dr. Regenia Skeeter at 1419 hours. Aortic Atherosclerosis (ICD10-I70.0). Electronically Signed   By: Nelson Chimes M.D.   On: 03/13/2021 14:19   CT Chest W Contrast  Result Date: 03/09/2021 CLINICAL DATA:  Fall 3 weeks prior with right rib fractures and persistent chest pain. EXAM: CT CHEST WITH CONTRAST TECHNIQUE: Multidetector CT imaging of the chest was performed during intravenous contrast administration. CONTRAST:  52mL OMNIPAQUE IOHEXOL 300 MG/ML  SOLN COMPARISON:  Chest radiograph from earlier today. 11/22/2019 chest CT angiogram. FINDINGS: Cardiovascular: Normal heart size. No significant pericardial effusion/thickening. Three-vessel coronary atherosclerosis. Atherosclerotic nonaneurysmal thoracic aorta. Top-normal caliber main pulmonary artery (3.0 cm diameter). No central pulmonary emboli. Mediastinum/Nodes: No discrete thyroid nodules. Unremarkable esophagus. No pathologically enlarged axillary, mediastinal or hilar lymph nodes. Lungs/Pleura: No pneumothorax. No pleural effusion. No acute consolidative  airspace disease, lung masses or significant pulmonary nodules. Mild platelike atelectasis in the anterior basilar left lower lobe. Mild patchy ground-glass opacity in peripheral posterior right mid lung. Upper abdomen: No acute abnormality. Musculoskeletal: No aggressive appearing focal osseous lesions. Nondisplaced acute anterior right sixth, seventh and eighth rib fractures with surrounding chest wall hematoma measuring up to 8.1 x 4.9 cm in maximum axial dimensions at the level of the anterior right sixth rib fracture (series 2/image 114). Moderate thoracic spondylosis. IMPRESSION: 1. Nondisplaced acute anterior right sixth, seventh and eighth rib fractures with surrounding chest wall hematoma. No pneumothorax or hemothorax. No discrete bone lesions. Clinical follow-up advised to ensure resolution of the right chest wall hematoma. Any need for follow-up imaging should be based on clinical assessment. 2. Mild patchy ground-glass opacity in the peripheral posterior right mid lung, nonspecific, favor mild pulmonary contusion. 3. Mild platelike atelectasis at the left lung base. 4. Three-vessel coronary atherosclerosis. 5. Aortic Atherosclerosis (ICD10-I70.0). Electronically  Signed   By: Ilona Sorrel M.D.   On: 03/09/2021 13:03   IR US Guide Bx Asp/Drain  Result Date: 03/14/2021 INDICATION: 69 year old with history of fall and right rib fractures. Patient developed right chest hematomas. Patient is complaining of pain and recent CT demonstrates gas within the hematomas. Findings are concerning for infected hematomas. EXAM: PLACEMENT OF SUPERFICIAL RIGHT ANTERIOR CHEST WALL DRAIN USING ULTRASOUND GUIDANCE x 2 MEDICATIONS: Moderate sedation ANESTHESIA/SEDATION: Fentanyl 100 mcg IV; Versed 2.0 mg IV Moderate Sedation Time:  29 minutes The patient was continuously monitored during the procedure by the interventional radiology nurse under my direct supervision. COMPLICATIONS: None immediate. PROCEDURE: Informed  written consent was obtained from the patient after a thorough discussion of the procedural risks, benefits and alternatives. All questions were addressed. Maximal Sterile Barrier Technique was utilized including caps, mask, sterile gowns, sterile gloves, sterile drape, hand hygiene and skin antiseptic. A timeout was performed prior to the initiation of the procedure. The right anterior chest wall hematomas were evaluated with ultrasound. Complex collections containing a small amount of fluid were obtained. The right anterior chest was prepped with chlorhexidine and sterile field was created. Attention was initially directed to the more inferior or caudal anterior chest wall collection. Skin was anesthetized with 1% lidocaine. Using ultrasound guidance, an 18 gauge trocar needle was directed into the collection and pink purulent fluid was aspirated. Superstiff Amplatz wire was advanced into the complex collection and the tract was dilated to accommodate a 10 [place] drain. Catheter was sutured to the skin and attached to a suction bulb. A sample of fluid was sent for culture. A second area more cephalad was targeted with ultrasound guidance. Skin was anesthetized with 1% lidocaine. Using ultrasound guidance, an 18 gauge trocar needle was directed into the complex collection. Again, purulent fluid was aspirated. Superstiff Amplatz wire was advanced into this collection and a 10 French drain was placed. Additional purulent fluid was aspirated and the catheter was sutured to skin and attached to a suction bulb. Dressings were placed over both drains. FINDINGS: Patient has palpable hematomas in the right anterior chest and upper abdomen region. Ultrasound demonstrates heterogeneous collections in both areas containing a small amount of compressible fluid. There are echogenic areas within the collections compatible with known gas. 10 French drains were placed in both areas and purulent fluid was draining from both  collections. Both collections are very complex and compatible with infected hematomas. IMPRESSION: Ultrasound-guided placement of 2 percutaneous drains within the superficial right anterior chest wall collections. Findings are compatible with infected hematomas. Fluid sample was sent for culture. Electronically Signed   By: Markus Daft M.D.   On: 03/14/2021 18:37   IR US Guide Bx Asp/Drain  Result Date: 03/14/2021 INDICATION: 70 year old with history of fall and right rib fractures. Patient developed right chest hematomas. Patient is complaining of pain and recent CT demonstrates gas within the hematomas. Findings are concerning for infected hematomas. EXAM: PLACEMENT OF SUPERFICIAL RIGHT ANTERIOR CHEST WALL DRAIN USING ULTRASOUND GUIDANCE x 2 MEDICATIONS: Moderate sedation ANESTHESIA/SEDATION: Fentanyl 100 mcg IV; Versed 2.0 mg IV Moderate Sedation Time:  29 minutes The patient was continuously monitored during the procedure by the interventional radiology nurse under my direct supervision. COMPLICATIONS: None immediate. PROCEDURE: Informed written consent was obtained from the patient after a thorough discussion of the procedural risks, benefits and alternatives. All questions were addressed. Maximal Sterile Barrier Technique was utilized including caps, mask, sterile gowns, sterile gloves, sterile drape, hand hygiene and skin antiseptic.  A timeout was performed prior to the initiation of the procedure. The right anterior chest wall hematomas were evaluated with ultrasound. Complex collections containing a small amount of fluid were obtained. The right anterior chest was prepped with chlorhexidine and sterile field was created. Attention was initially directed to the more inferior or caudal anterior chest wall collection. Skin was anesthetized with 1% lidocaine. Using ultrasound guidance, an 18 gauge trocar needle was directed into the collection and pink purulent fluid was aspirated. Superstiff Amplatz wire  was advanced into the complex collection and the tract was dilated to accommodate a 10 [place] drain. Catheter was sutured to the skin and attached to a suction bulb. A sample of fluid was sent for culture. A second area more cephalad was targeted with ultrasound guidance. Skin was anesthetized with 1% lidocaine. Using ultrasound guidance, an 18 gauge trocar needle was directed into the complex collection. Again, purulent fluid was aspirated. Superstiff Amplatz wire was advanced into this collection and a 10 French drain was placed. Additional purulent fluid was aspirated and the catheter was sutured to skin and attached to a suction bulb. Dressings were placed over both drains. FINDINGS: Patient has palpable hematomas in the right anterior chest and upper abdomen region. Ultrasound demonstrates heterogeneous collections in both areas containing a small amount of compressible fluid. There are echogenic areas within the collections compatible with known gas. 10 French drains were placed in both areas and purulent fluid was draining from both collections. Both collections are very complex and compatible with infected hematomas. IMPRESSION: Ultrasound-guided placement of 2 percutaneous drains within the superficial right anterior chest wall collections. Findings are compatible with infected hematomas. Fluid sample was sent for culture. Electronically Signed   By: Markus Daft M.D.   On: 03/14/2021 18:37   DG Chest Port 1 View  Result Date: 03/13/2021 CLINICAL DATA:  Sepsis, right rib pain. EXAM: PORTABLE CHEST 1 VIEW COMPARISON:  March 09, 2021. FINDINGS: The heart size and mediastinal contours are within normal limits. Both lungs are clear. No pneumothorax or pleural effusion is noted. The visualized skeletal structures are unremarkable. IMPRESSION: No active disease. Electronically Signed   By: Marijo Conception M.D.   On: 03/13/2021 14:11   DG Chest Portable 1 View  Result Date: 03/09/2021 CLINICAL DATA:  Chest  pain. Chest Arctic hurting late yesterday. Complains of right-sided sternal pain and chest pain. EXAM: PORTABLE CHEST 1 VIEW COMPARISON:  None. FINDINGS: The heart size and mediastinal contours are within normal limits. Both lungs are clear. Blunting of the left costophrenic angle is noted, new from prior studies. The visualized skeletal structures are unremarkable. IMPRESSION: New blunting of left costophrenic angle may reflect small effusion. The lungs are otherwise clear. No signs of interstitial edema. Electronically Signed   By: Kerby Moors M.D.   On: 03/09/2021 11:26   [place] IMAGE GUIDED DRAINAGE BY PERCUTANEOUS CATHETER  Result Date: 03/18/2021 INDICATION: History of right-sided rib fractures complicated by development of infected adjacent hematomas, post ultrasound-guided placement of two percutaneous drainage catheters on 03/14/2021. Unfortunately, one of the percutaneous drainage catheters was inadvertently removed with subsequent chest CT performed 03/17/2021 demonstrating a persistent undrained complex fluid collection involving the anterior aspect of the right chest wall. As such, request made for ultrasound-guided aspiration and/or drainage catheter placement for infection source control purposes. EXAM: 1. ULTRASOUND-GUIDED RIGHT CHEST WALL PERCUTANEOUS DRAINAGE CATHETER PLACEMENT X2 2. ULTRASOUND-GUIDED RIGHT CHEST WALL ABSCESS ASPIRATION COMPARISON:  Chest CT-03/17/2021; 03/13/2021; ultrasound-guided right chest wall percutaneous drainage catheter placement  x2-03/14/2021 MEDICATIONS: The patient is currently admitted to the hospital and receiving intravenous antibiotics. The antibiotics were administered within an appropriate time frame prior to the initiation of the procedure. ANESTHESIA/SEDATION: Moderate (conscious) sedation was employed during this procedure. A total of Versed 4 mg and Fentanyl 200 mcg was administered intravenously. Moderate Sedation Time: 41 minutes. The patient's level of  consciousness and vital signs were monitored continuously by radiology nursing throughout the procedure under my direct supervision. CONTRAST:  None COMPLICATIONS: None immediate. PROCEDURE: Informed written consent was obtained from the patient after a discussion of the risks, benefits and alternatives to treatment. Preprocedural ultrasound scanning demonstrated complex fluid involving the anterior aspect of the right chest wall compatible with the findings seen on preceding chest CT. A timeout was performed prior to the initiation of the procedure. The skin overlying the right anterior chest was prepped and draped in the usual sterile fashion. The overlying soft tissues were anesthetized with 1% lidocaine with epinephrine. Under direct ultrasound guidance, a 18 gauge trocar needle was advanced into the complex abscess within the right chest wall, inferior to the nipple. A short Amplatz wire was coiled within the collection. The track was dilated ultimately allowing placement of a 10 French percutaneous catheter. Multiple ultrasound images were saved procedural documentation purposes. Next, approximately 10 cc of purulent fluid was aspirated from the drainage catheter Sonographic evaluation demonstrates a residual complex fluid collection and as such the more medial component of the collection was accessed with an 18 gauge trocar needle. A short Amplatz wire was coiled within the collection and an additional 10 French percutaneous drainage catheter was placed under ultrasound guidance. Multiple ultrasound images were saved procedural documentation purposes. Next, approximately 30 cc of purulent fluid was aspirated from this more medially positioned chest wall drain. A representative sample was capped and sent to the laboratory for analysis. Next, the lateral component of the right chest wall collection was accessed with an 18 gauge trocar needle however only a small amount of bloody fluid was able to be aspirated.  As such, a short Amplatz wire was coiled within the collection and the trocar needle was exchanged for a Yueh sheath catheter which was utilized to aspirate approximately 3 cc of thick bloody fluid. At this time, approximately 70 cc of additional blood-tinged purulent fluid was able to be expressed from the site of the previously removed percutaneous drainage catheter. Drainage catheters were secured at the entrance site within interrupted sutures and drainage catheters were connected to JP bulbs. Dressings were applied. The patient tolerated the procedure well without immediate postprocedural complication. IMPRESSION: 1. Successful ultrasound-guided placement of 2 two additional right anterior chest wall percutaneous drainage catheters yielding a total of 40 cc of purulent fluid. A representative aspirated sample was sent to the laboratory for analysis. 2. Successful ultrasound-guided aspiration of approximately 3 cc of thick bloody fluid from the more lateral component of the right anterior chest wall complex hematoma. 3. Successful bedside expression of approximately 70 cc of purulent fluid from the entrance site of the recently removed percutaneous drainage catheter. Electronically Signed   By: Sandi Mariscal M.D.   On: 03/18/2021 15:29   [place] IMAGE GUIDED DRAINAGE BY PERCUTANEOUS CATHETER  Result Date: 03/18/2021 INDICATION: History of right-sided rib fractures complicated by development of infected adjacent hematomas, post ultrasound-guided placement of two percutaneous drainage catheters on 03/14/2021. Unfortunately, one of the percutaneous drainage catheters was inadvertently removed with subsequent chest CT performed 03/17/2021 demonstrating a persistent undrained complex fluid collection involving  the anterior aspect of the right chest wall. As such, request made for ultrasound-guided aspiration and/or drainage catheter placement for infection source control purposes. EXAM: 1. ULTRASOUND-GUIDED RIGHT CHEST  WALL PERCUTANEOUS DRAINAGE CATHETER PLACEMENT X2 2. ULTRASOUND-GUIDED RIGHT CHEST WALL ABSCESS ASPIRATION COMPARISON:  Chest CT-03/17/2021; 03/13/2021; ultrasound-guided right chest wall percutaneous drainage catheter placement x2-03/14/2021 MEDICATIONS: The patient is currently admitted to the hospital and receiving intravenous antibiotics. The antibiotics were administered within an appropriate time frame prior to the initiation of the procedure. ANESTHESIA/SEDATION: Moderate (conscious) sedation was employed during this procedure. A total of Versed 4 mg and Fentanyl 200 mcg was administered intravenously. Moderate Sedation Time: 41 minutes. The patient's level of consciousness and vital signs were monitored continuously by radiology nursing throughout the procedure under my direct supervision. CONTRAST:  None COMPLICATIONS: None immediate. PROCEDURE: Informed written consent was obtained from the patient after a discussion of the risks, benefits and alternatives to treatment. Preprocedural ultrasound scanning demonstrated complex fluid involving the anterior aspect of the right chest wall compatible with the findings seen on preceding chest CT. A timeout was performed prior to the initiation of the procedure. The skin overlying the right anterior chest was prepped and draped in the usual sterile fashion. The overlying soft tissues were anesthetized with 1% lidocaine with epinephrine. Under direct ultrasound guidance, a 18 gauge trocar needle was advanced into the complex abscess within the right chest wall, inferior to the nipple. A short Amplatz wire was coiled within the collection. The track was dilated ultimately allowing placement of a 10 French percutaneous catheter. Multiple ultrasound images were saved procedural documentation purposes. Next, approximately 10 cc of purulent fluid was aspirated from the drainage catheter Sonographic evaluation demonstrates a residual complex fluid collection and as such  the more medial component of the collection was accessed with an 18 gauge trocar needle. A short Amplatz wire was coiled within the collection and an additional 10 French percutaneous drainage catheter was placed under ultrasound guidance. Multiple ultrasound images were saved procedural documentation purposes. Next, approximately 30 cc of purulent fluid was aspirated from this more medially positioned chest wall drain. A representative sample was capped and sent to the laboratory for analysis. Next, the lateral component of the right chest wall collection was accessed with an 18 gauge trocar needle however only a small amount of bloody fluid was able to be aspirated. As such, a short Amplatz wire was coiled within the collection and the trocar needle was exchanged for a Yueh sheath catheter which was utilized to aspirate approximately 3 cc of thick bloody fluid. At this time, approximately 70 cc of additional blood-tinged purulent fluid was able to be expressed from the site of the previously removed percutaneous drainage catheter. Drainage catheters were secured at the entrance site within interrupted sutures and drainage catheters were connected to JP bulbs. Dressings were applied. The patient tolerated the procedure well without immediate postprocedural complication. IMPRESSION: 1. Successful ultrasound-guided placement of 2 two additional right anterior chest wall percutaneous drainage catheters yielding a total of 40 cc of purulent fluid. A representative aspirated sample was sent to the laboratory for analysis. 2. Successful ultrasound-guided aspiration of approximately 3 cc of thick bloody fluid from the more lateral component of the right anterior chest wall complex hematoma. 3. Successful bedside expression of approximately 70 cc of purulent fluid from the entrance site of the recently removed percutaneous drainage catheter. Electronically Signed   By: Sandi Mariscal M.D.   On: 03/18/2021 15:29   [place] FINE  NEEDLE ASP 1ST LESION  Result Date: 03/18/2021 INDICATION: History of right-sided rib fractures complicated by development of infected adjacent hematomas, post ultrasound-guided placement of two percutaneous drainage catheters on 03/14/2021. Unfortunately, one of the percutaneous drainage catheters was inadvertently removed with subsequent chest CT performed 03/17/2021 demonstrating a persistent undrained complex fluid collection involving the anterior aspect of the right chest wall. As such, request made for ultrasound-guided aspiration and/or drainage catheter placement for infection source control purposes. EXAM: 1. ULTRASOUND-GUIDED RIGHT CHEST WALL PERCUTANEOUS DRAINAGE CATHETER PLACEMENT X2 2. ULTRASOUND-GUIDED RIGHT CHEST WALL ABSCESS ASPIRATION COMPARISON:  Chest CT-03/17/2021; 03/13/2021; ultrasound-guided right chest wall percutaneous drainage catheter placement x2-03/14/2021 MEDICATIONS: The patient is currently admitted to the hospital and receiving intravenous antibiotics. The antibiotics were administered within an appropriate time frame prior to the initiation of the procedure. ANESTHESIA/SEDATION: Moderate (conscious) sedation was employed during this procedure. A total of Versed 4 mg and Fentanyl 200 mcg was administered intravenously. Moderate Sedation Time: 41 minutes. The patient's level of consciousness and vital signs were monitored continuously by radiology nursing throughout the procedure under my direct supervision. CONTRAST:  None COMPLICATIONS: None immediate. PROCEDURE: Informed written consent was obtained from the patient after a discussion of the risks, benefits and alternatives to treatment. Preprocedural ultrasound scanning demonstrated complex fluid involving the anterior aspect of the right chest wall compatible with the findings seen on preceding chest CT. A timeout was performed prior to the initiation of the procedure. The skin overlying the right anterior chest was prepped  and draped in the usual sterile fashion. The overlying soft tissues were anesthetized with 1% lidocaine with epinephrine. Under direct ultrasound guidance, a 18 gauge trocar needle was advanced into the complex abscess within the right chest wall, inferior to the nipple. A short Amplatz wire was coiled within the collection. The track was dilated ultimately allowing placement of a 10 French percutaneous catheter. Multiple ultrasound images were saved procedural documentation purposes. Next, approximately 10 cc of purulent fluid was aspirated from the drainage catheter Sonographic evaluation demonstrates a residual complex fluid collection and as such the more medial component of the collection was accessed with an 18 gauge trocar needle. A short Amplatz wire was coiled within the collection and an additional 10 French percutaneous drainage catheter was placed under ultrasound guidance. Multiple ultrasound images were saved procedural documentation purposes. Next, approximately 30 cc of purulent fluid was aspirated from this more medially positioned chest wall drain. A representative sample was capped and sent to the laboratory for analysis. Next, the lateral component of the right chest wall collection was accessed with an 18 gauge trocar needle however only a small amount of bloody fluid was able to be aspirated. As such, a short Amplatz wire was coiled within the collection and the trocar needle was exchanged for a Yueh sheath catheter which was utilized to aspirate approximately 3 cc of thick bloody fluid. At this time, approximately 70 cc of additional blood-tinged purulent fluid was able to be expressed from the site of the previously removed percutaneous drainage catheter. Drainage catheters were secured at the entrance site within interrupted sutures and drainage catheters were connected to JP bulbs. Dressings were applied. The patient tolerated the procedure well without immediate postprocedural  complication. IMPRESSION: 1. Successful ultrasound-guided placement of 2 two additional right anterior chest wall percutaneous drainage catheters yielding a total of 40 cc of purulent fluid. A representative aspirated sample was sent to the laboratory for analysis. 2. Successful ultrasound-guided aspiration of approximately 3 cc of thick bloody  fluid from the more lateral component of the right anterior chest wall complex hematoma. 3. Successful bedside expression of approximately 70 cc of purulent fluid from the entrance site of the recently removed percutaneous drainage catheter. Electronically Signed   By: Sandi Mariscal M.D.   On: 03/18/2021 15:29

## 2021-03-21 NOTE — Progress Notes (Signed)
2 Days Post-Op Procedure(s) (LRB): INCISION AND DRAINAGE RIGHT CHEST ABSCESS (Right) Subjective: C/o pain at operative site  Objective: Vital signs in last 24 hours: Temp:  [97.6 F (36.4 C)-97.9 F (36.6 C)] 97.9 F (36.6 C) (04/02 0614) Pulse Rate:  [63-81] 63 (04/02 0614) Cardiac Rhythm: Normal sinus rhythm;Bundle branch block (04/02 0722) Resp:  [12-20] 12 (04/02 0614) BP: (121-150)/(74-83) 135/74 (04/02 0614) SpO2:  [99 %-100 %] 100 % (04/02 0614)  Hemodynamic parameters for last 24 hours:    Intake/Output from previous day: 04/01 0701 - 04/02 0700 In: 120 [P.O.:120] Out: 2025 [Urine:1950; Drains:75] Intake/Output this shift: No intake/output data recorded.  General appearance: alert, cooperative and no distress Neurologic: intact Heart: regular rate and rhythm Lungs: clear to auscultation bilaterally Wound: VAC in place  Lab Results: Recent Labs    03/20/21 0036 03/21/21 0252  WBC 7.8 8.8  HGB 11.2* 10.1*  HCT 33.6* 29.8*  PLT 234 289   BMET:  Recent Labs    03/20/21 0036 03/21/21 0252  NA 132* 132*  K 5.2* 4.3  CL 97* 97*  CO2 25 27  GLUCOSE 266* 111*  BUN 16 23  CREATININE 1.27* 1.21  CALCIUM 8.3* 8.3*    PT/INR: No results for input(s): LABPROT, INR in the last 72 hours. ABG No results found for: PHART, HCO3, TCO2, ACIDBASEDEF, O2SAT CBG (last 3)  Recent Labs    03/20/21 2108 03/21/21 0613 03/21/21 0736  GLUCAP 184* 142* 117*    Assessment/Plan: S/P Procedure(s) (LRB): INCISION AND DRAINAGE RIGHT CHEST ABSCESS (Right) -VAC in place. Plan is to leave for 5 days prior to change Pain control an issue- on hydrocodone with dilaudid for breakthrough Afebrile on Vancomycin   LOS: 8 days    Melrose Nakayama 03/21/2021

## 2021-03-21 NOTE — Plan of Care (Signed)
°  Problem: Education: °Goal: Knowledge of General Education information will improve °Description: Including pain rating scale, medication(s)/side effects and non-pharmacologic comfort measures °Outcome: Progressing °  °Problem: Health Behavior/Discharge Planning: °Goal: Ability to manage health-related needs will improve °Outcome: Progressing °  °Problem: Clinical Measurements: °Goal: Ability to maintain clinical measurements within normal limits will improve °Outcome: Progressing °Goal: Respiratory complications will improve °Outcome: Progressing °  °Problem: Activity: °Goal: Risk for activity intolerance will decrease °Outcome: Progressing °  °Problem: Pain Managment: °Goal: General experience of comfort will improve °Outcome: Progressing °  °

## 2021-03-22 ENCOUNTER — Inpatient Hospital Stay (HOSPITAL_COMMUNITY): Payer: Medicare Other

## 2021-03-22 DIAGNOSIS — N179 Acute kidney failure, unspecified: Secondary | ICD-10-CM | POA: Diagnosis not present

## 2021-03-22 DIAGNOSIS — A419 Sepsis, unspecified organism: Secondary | ICD-10-CM | POA: Diagnosis not present

## 2021-03-22 DIAGNOSIS — R6520 Severe sepsis without septic shock: Secondary | ICD-10-CM | POA: Diagnosis not present

## 2021-03-22 LAB — GLUCOSE, CAPILLARY
Glucose-Capillary: 167 mg/dL — ABNORMAL HIGH (ref 70–99)
Glucose-Capillary: 163 mg/dL — ABNORMAL HIGH (ref 70–99)
Glucose-Capillary: 149 mg/dL — ABNORMAL HIGH (ref 70–99)
Glucose-Capillary: 141 mg/dL — ABNORMAL HIGH (ref 70–99)

## 2021-03-22 MED ORDER — IOHEXOL 300 MG/ML  SOLN
75.0000 mL | Freq: Once | INTRAMUSCULAR | Status: AC | PRN
  Administered 2021-03-22: 75 mL via INTRAVENOUS

## 2021-03-22 NOTE — Progress Notes (Signed)
PROGRESS NOTE                                                                                                                                                                                                             Patient Demographics:    Brian Malone, is a 70 y.o. male, DOB - 1951-02-22, UJW:[id number]  Outpatient Primary MD for the patient is [name], [name], MD    LOS - 9  Admit date - 03/13/2021    Chief Complaint  Patient presents with  . Rib Injury       Brief Narrative (HPI from H&P)  70 year old male with past medical history of hepatitis C, hypertension, hyperlipidemia, diabetes mellitus type 2, chronic pain syndrome, intravenous drug use (abstinent since 2019), major depressive disorder and HIV who had a trip and fall in his yard on 02/22/2021 after which he hurt his right-sided chest wall, at that time he was diagnosed with right-sided rib cage fracture and injury, he was seen in the ER a few times since then and was treated conservatively, his pain and discomfort continued to get worse and he presented again to [place] on  03/13/2021 CT scan suggested that he had right chest wall abscess formation and cellulitis with concerns for sepsis.   Subjective:   Patient in bed, appears comfortable, denies any headache, no fever, complains of ongoing right chest discomfort but states it is somewhat better now than before, continues to insist that he needs IV Dilaudid frequently, no shortness of breath , no abdominal pain. No new focal weakness.    Assessment  & Plan :     1. Sepsis due to right-sided chest wall abscess formation after mechanical fall and multiple right rib fractures sustained on 02/22/2021 - he being treated with empiric IV antibiotics along with IV fluids, currently sepsis pathophysiology has improved, Dr. Kipp Brood cardiothoracic surgery was consulted in  the ER who suggested ultrasound-guided drainage of the abscess by IR, this was done by IR on 03/14/2021, infected hematoma/abscess which was drained, fluid growing MRSA .  Despite having drains placed by IR his infection continue to get worse, eventually he was taken to the OR by Dr. Kipp Brood on 03/19/2021, he had some postop bleeding from the site which was stabilized by cardiothoracic surgery tonight 03/09/2021, now has a wound VAC in place  and clinically looks better.  Continue vancomycin for now and monitor.  Encouraged the patient to sit up in chair in the daytime use I-S and flutter valve for pulmonary toiletry.  Will advance activity and titrate down oxygen as possible.   2.  Chronic pain and narcotic use.  Supportive care as needed for acute discomfort, avoid overuse, as needed Narcan added.  Patient counseled see above.  He does exhibit narcotic seeking behavior, has been warned multiple times as this can lead to accidental overdose and even death friend in the room on 2021-03-19 also made aware of the concerns. Kindly see nursing documentation as well.   3.  Continue home regimen follows with [facility] ID department.  4.  Anxiety and depression.  Home medications continued.  Currently not homicidal or suicidal.  5.  GERD.  On PPI.  6.  BPH.  On Flomax.  7.  AKI.  Improved after IV fluids likely due to ATN from sepsis.  8.  Hypertension.  Blood pressure soft, skipped today's blood pressure medications will monitor closely.   9.  DM type II.  On sliding scale monitor and adjust  Lab Results  Component Value Date   HGBA1C 8.3 (H) 03/14/2021   CBG (last 3)  Recent Labs    03/21/21 1715 03/21/21 2012 03/22/21 0643  GLUCAP 167* 121* 167*          Condition - Extremely Guarded  Family Communication  : None present bedside  Code Status :  Full  Consults  :  IR  PUD Prophylaxis : PPI   Procedures  :     Right chest wall I&D by Dr. Kipp Brood 03/19/21  US guided  chest wall abscess drined by IR 03/14/21   CT -  Increasing size of the right chest wall collections, primarily centered around the right sixth and eighth anterolateral ribs, with development of air bubbles. Those ribs show a pattern of irregular destruction that have progressed and look more like pathologic fractures due to infection rather than traumatic fractures. Air bubbles are now present within the collections and they probably represent abscesses. There is mild extrapleural involvement on the inner side of the ribcage, without frank breakthrough into the pleural space. The majority of the collections are on the outer side of the ribcage. No new areas of involvement are seen.  CT repeat 03/17/21 - 1. Interval decrease in size of bilobed right anterolateral chest wall fluid collection. The inferior lobulation is nearly completely resolved with some fat stranding and subcutaneous emphysema remaining. The more superior lobulation has decreased in size since prior study from 03/13/2021. The overall size of the collections now measures 7.4 x 7.1 x 3.7 cm compared to 10.1 x 9.1 x 5.7 cm on 03/13/2021. 2. Lucency in the anterior segments of the right sixth and seventh ribs again seen. Findings are suspicious for osteomyelitis, however underlying pathologic fracture is also possible.      Disposition Plan  :    Status is: Inpatient  Remains inpatient appropriate because:IV treatments appropriate due to intensity of illness or inability to take PO   Dispo: The patient is from: Home              Anticipated d/c is to: Home              Patient currently is not medically stable to d/c.   Difficult to place patient No  DVT Prophylaxis  :   Heparin   Lab Results  Component Value  Date   PLT 289 03/21/2021    Diet :  Diet Order            Diet regular Room service appropriate? Yes; Fluid consistency: Thin  Diet effective now                  Inpatient Medications  Scheduled Meds: .  (feeding supplement) PROSource Plus  30 mL Oral TID WC  . abacavir-dolutegravir-lamiVUDine  1 tablet Oral Daily  . busPIRone  30 mg Oral BID  . clonazepam  0.5 mg Oral TID  . ezetimibe  10 mg Oral Daily  . feeding supplement  237 mL Oral BID BM  . feeding supplement (GLUCERNA SHAKE)  237 mL Oral Q24H  . Glecaprevir-Pibrentasvir  3 tablet Oral Daily  . heparin injection (subcutaneous)  5,000 Units Subcutaneous Q8H  . insulin aspart  0-15 Units Subcutaneous TID AC & HS  . metoprolol tartrate  25 mg Oral BID  . multivitamin with minerals  1 tablet Oral Daily  . pantoprazole  40 mg Oral QAC breakfast  . sodium chloride flush  5 mL Intracatheter Q8H  . sodium polystyrene  30 g Oral Once  . tamsulosin  0.4 mg Oral QHS  . tiZANidine  2 mg Oral QHS   Continuous Infusions: . sodium chloride Stopped (03/13/21 2106)  . vancomycin     PRN Meds:.sodium chloride, acetaminophen **OR** [DISCONTINUED] acetaminophen, antiseptic oral rinse, HYDROcodone-acetaminophen, HYDROmorphone (DILAUDID) injection **OR** [DISCONTINUED]  HYDROmorphone (DILAUDID) injection, naLOXone (NARCAN)  injection, [DISCONTINUED] ondansetron **OR** ondansetron (ZOFRAN) IV, polyethylene glycol, valACYclovir  Antibiotics  :    Anti-infectives (From admission, onward)   Start     Dose/Rate Route Frequency Ordered Stop   03/22/21 1200  vancomycin (VANCOREADY) IVPB 1250 mg/250 mL        1,250 mg 166.7 mL/hr over 90 Minutes Intravenous Every 24 hours 03/21/21 1319     03/20/21 1000  Glecaprevir-Pibrentasivir 100-40 mg (Mavyret) tabs 3 tablets - patient's own supply        3 tablet Oral Daily 03/20/21 0733     03/15/21 1200  vancomycin (VANCOREADY) IVPB 1500 mg/300 mL  Status:  Discontinued        1,500 mg 150 mL/hr over 120 Minutes Intravenous Every 24 hours 03/15/21 0732 03/21/21 1319   03/14/21 1200  vancomycin (VANCOREADY) IVPB 1250 mg/250 mL  Status:  Discontinued        1,250 mg 166.7 mL/hr over 90 Minutes Intravenous Every  24 hours 03/13/21 1359 03/15/21 0732   03/14/21 1023  valACYclovir (VALTREX) tablet 1,000 mg       Note to Pharmacy: Trig neuralgia flare     1,000 mg Oral 3 times daily PRN 03/14/21 1023     03/14/21 1000  abacavir-dolutegravir-lamiVUDine (TRIUMEQ) 600-50-300 MG per tablet 1 tablet        1 tablet Oral Daily 03/13/21 2320     03/14/21 0200  meropenem (MERREM) 1 g in sodium chloride 0.9 % 100 mL IVPB  Status:  Discontinued        1 g 200 mL/hr over 30 Minutes Intravenous Every 8 hours 03/13/21 2337 03/16/21 0719   03/14/21 0100  ceFEPIme (MAXIPIME) 2 g in sodium chloride 0.9 % 100 mL IVPB  Status:  Discontinued       Note to Pharmacy: Cefepime 2 g IV q12h for CrCl < 60 mL/min   2 g 200 mL/hr over 30 Minutes Intravenous Every 12 hours 03/13/21 2326 03/13/21 2331   03/13/21 2000  piperacillin-tazobactam (ZOSYN) IVPB 3.375 g  Status:  Discontinued        3.375 g 12.5 mL/hr over 240 Minutes Intravenous Every 8 hours 03/13/21 1402 03/13/21 2320   03/13/21 1430  clindamycin (CLEOCIN) IVPB 900 mg        900 mg 100 mL/hr over 30 Minutes Intravenous  Once 03/13/21 1418 03/13/21 1548   03/13/21 1230  vancomycin (VANCOCIN) IVPB 1000 mg/200 mL premix        1,000 mg 200 mL/hr over 60 Minutes Intravenous  Once 03/13/21 1229 03/13/21 1506   03/13/21 1230  piperacillin-tazobactam (ZOSYN) IVPB 3.375 g        3.375 g 100 mL/hr over 30 Minutes Intravenous  Once 03/13/21 1229 03/13/21 1400       Time Spent in minutes  30   Lala Lund M.D on 03/22/2021 at 9:41 AM  To page go to [email or web address]   [facility] -  Office  [phone]    See all Orders from today for further details    Objective:   Vitals:   03/21/21 0614 03/21/21 1700 03/21/21 2014 03/22/21 0505  BP: 135/74 (!) 141/77 (!) 153/79 105/65  Pulse: 63 67 78 60  Resp: 12 15 16 16   Temp: 97.9 F (36.6 C) 99 F (37.2 C) 98.2 F (36.8 C) 98.3 F (36.8 C)  TempSrc: Axillary Oral Oral Axillary  SpO2: 100% 98% 100% 100%   Weight:      Height:        Wt Readings from Last 3 Encounters:  03/19/21 85.2 kg  03/09/21 77.1 kg  01/22/21 72.6 kg     Intake/Output Summary (Last 24 hours) at 03/22/2021 0941 Last data filed at 03/22/2021 0453 Gross per 24 hour  Intake --  Output 1851 ml  Net -1851 ml   Physical Exam  Awake Alert, No new F.N deficits, Normal affect [place].AT,PERRAL Supple Neck,No JVD, No cervical lymphadenopathy appriciated.  Symmetrical Chest wall movement, Good air movement bilaterally, right chest wall wound VAC in place, RRR,No Gallops, Rubs or new Murmurs, No Parasternal Heave +ve B.Sounds, Abd Soft, No tenderness, No organomegaly appriciated, No rebound - guarding or rigidity. No Cyanosis, Clubbing or edema, No new Rash or bruise     Data Review:    CBC Recent Labs  Lab 03/17/21 0116 03/18/21 0151 03/19/21 0103 03/20/21 0036 03/21/21 0252  WBC 9.2 8.0 9.4 7.8 8.8  HGB 10.0* 10.2* 11.4* 11.2* 10.1*  HCT 29.6* 29.8* 33.5* 33.6* 29.8*  PLT 232 206 214 234 289  MCV 96.4 96.1 95.7 95.7 96.4  MCH 32.6 32.9 32.6 31.9 32.7  MCHC 33.8 34.2 34.0 33.3 33.9  RDW 13.0 12.9 12.8 12.8 12.9  LYMPHSABS 0.9 1.1 1.1 0.4* 1.7  MONOABS 1.1* 1.0 1.1* 0.3 0.8  EOSABS 0.1 0.1 0.1 0.0 0.1  BASOSABS 0.0 0.0 0.0 0.0 0.0    Recent Labs  Lab 03/17/21 0116 03/18/21 0151 03/19/21 0103 03/20/21 0036 03/21/21 0252  NA 134* 133* 131* 132* 132*  K 3.5 3.8 4.4 5.2* 4.3  CL 102 100 97* 97* 97*  CO2 24 26 29 25 27   GLUCOSE 198* 104* 124* 266* 111*  BUN 14 10 8 16 23   CREATININE 1.15 1.10 1.13 1.27* 1.21  CALCIUM 7.7* 8.1* 8.2* 8.3* 8.3*  AST 12* 13* 13* 13* 14*  ALT 6 8 8 11 9   ALKPHOS 98 82 90 87 81  BILITOT 0.6 0.6 0.9 0.7 0.6  ALBUMIN 1.6* 1.7* 1.8* 1.9* 2.1*  MG 1.6* 1.7 1.7  1.7 2.0  PROCALCITON 0.35 0.26 0.27 0.12 0.10    ------------------------------------------------------------------------------------------------------------------ No results for input(s): CHOL, HDL, LDLCALC,  TRIG, CHOLHDL, LDLDIRECT in the last 72 hours.  Lab Results  Component Value Date   HGBA1C 8.3 (H) 03/14/2021   ------------------------------------------------------------------------------------------------------------------ No results for input(s): TSH, T4TOTAL, T3FREE, THYROIDAB in the last 72 hours.  Invalid input(s): FREET3  Cardiac Enzymes No results for input(s): CKMB, TROPONINI, MYOGLOBIN in the last 168 hours.  Invalid input(s): CK ------------------------------------------------------------------------------------------------------------------ No results found for: BNP  Micro Results Recent Results (from the past 240 hour(s))  Urine culture     Status: None   Collection Time: 03/13/21 12:25 AM   Specimen: In/Out Cath Urine  Result Value Ref Range Status   Specimen Description IN/OUT CATH URINE  Final   Special Requests NONE  Final   Culture   Final    NO GROWTH Performed at [facility], [address], [place], [place] [address]    Report Status 03/15/2021 FINAL  Final  Blood Culture (routine x 2)     Status: None   Collection Time: 03/13/21 12:25 PM   Specimen: Right Antecubital; Blood  Result Value Ref Range Status   Specimen Description   Final    RIGHT ANTECUBITAL BLOOD Performed at [facility], [address] [place], [place] [address]    Special Requests   Final    BOTTLES DRAWN AEROBIC AND ANAEROBIC Blood Culture results may not be optimal due to an inadequate volume of blood received in culture bottles Performed at [facility], [address] [place], [place] [address]    Culture   Final    NO GROWTH 5 DAYS Performed at [facility], [place] [address] [place], [place] [address]    Report Status 03/18/2021 FINAL  Final  Blood Culture (routine x 2)     Status: None   Collection Time: 03/13/21  1:15 PM   Specimen: BLOOD RIGHT HAND  Result Value Ref Range Status   Specimen Description   Final    BLOOD RIGHT HAND  BLOOD Performed at [facility], [address] [place], [place] [address]    Special Requests   Final    BOTTLES DRAWN AEROBIC AND ANAEROBIC Blood Culture results may not be optimal due to an inadequate volume of blood received in culture bottles Performed at [facility], [address] [place], [place] [address]    Culture   Final    NO GROWTH 5 DAYS Performed at [facility], [place] [address], [place],  [address]    Report Status 03/18/2021 FINAL  Final  Resp Panel by RT-PCR (Flu A&B, Covid) Nasopharyngeal Swab     Status: None   Collection Time: 03/13/21  2:01 PM   Specimen: Nasopharyngeal Swab; Nasopharyngeal(NP) swabs in vial transport medium  Result Value Ref Range Status   SARS Coronavirus 2 by RT PCR NEGATIVE NEGATIVE Final    Comment: (NOTE) SARS-CoV-2 target nucleic acids are NOT DETECTED.  The SARS-CoV-2 RNA is generally detectable in upper respiratory specimens during the acute phase of infection. The lowest concentration of SARS-CoV-2 viral copies this assay can detect is 138 copies/mL. A negative result does not preclude SARS-Cov-2 infection and should not be used as the sole basis for treatment or other patient management decisions. A negative result may occur with  improper specimen collection/handling, submission of specimen other than nasopharyngeal swab, presence of viral  mutation(s) within the areas targeted by this assay, and inadequate number of viral copies(<138 copies/mL). A negative result must be combined with clinical observations, patient history, and epidemiological information. The expected result is Negative.  Fact Sheet for Patients:  EntrepreneurPulse.com.au  Fact Sheet for Healthcare Providers:  IncredibleEmployment.be  This test is no t yet approved or cleared by the [place] FDA and  has been authorized for detection and/or diagnosis of SARS-CoV-2 by FDA under an  Emergency Use Authorization (EUA). This EUA will remain  in effect (meaning this test can be used) for the duration of the COVID-19 declaration under Section 564(b)(1) of the Act, 21 U.S.C.section 360bbb-3(b)(1), unless the authorization is terminated  or revoked sooner.       Influenza A by PCR NEGATIVE NEGATIVE Final   Influenza B by PCR NEGATIVE NEGATIVE Final    Comment: (NOTE) The Xpert Xpress SARS-CoV-2/FLU/RSV plus assay is intended as an aid in the diagnosis of influenza from Nasopharyngeal swab specimens and should not be used as a sole basis for treatment. Nasal washings and aspirates are unacceptable for Xpert Xpress SARS-CoV-2/FLU/RSV testing.  Fact Sheet for Patients: EntrepreneurPulse.com.au  Fact Sheet for Healthcare Providers: IncredibleEmployment.be  This test is not yet approved or cleared by the [place] FDA and has been authorized for detection and/or diagnosis of SARS-CoV-2 by FDA under an Emergency Use Authorization (EUA). This EUA will remain in effect (meaning this test can be used) for the duration of the COVID-19 declaration under Section 564(b)(1) of the Act, 21 U.S.C. section 360bbb-3(b)(1), unless the authorization is terminated or revoked.  Performed at [facility], [address] [place], [place] [address]   MRSA PCR Screening     Status: Abnormal   Collection Time: 03/13/21 11:51 PM   Specimen: Nasal Mucosa; Nasopharyngeal  Result Value Ref Range Status   MRSA by PCR POSITIVE (A) NEGATIVE Final    Comment:        The GeneXpert MRSA Assay (FDA approved for NASAL specimens only), is one component of a comprehensive MRSA colonization surveillance program. It is not intended to diagnose MRSA infection nor to guide or monitor treatment for MRSA infections. RESULT CALLED TO, READ BACK BY AND VERIFIED WITHDorna Bloom RN 03/14/21 0432 JDW Performed at [facility] [address]  [place], [place] [address]   Aerobic/Anaerobic Culture (surgical/deep wound)     Status: None   Collection Time: 03/14/21  1:23 PM   Specimen: Abscess  Result Value Ref Range Status   Specimen Description ABSCESS  Final   Special Requests NONE  Final   Gram Stain   Final    ABUNDANT WBC PRESENT, PREDOMINANTLY PMN ABUNDANT GRAM POSITIVE COCCI IN CLUSTERS    Culture   Final    ABUNDANT METHICILLIN RESISTANT STAPHYLOCOCCUS AUREUS NO ANAEROBES ISOLATED Performed at [facility], [address] [place], [place] [address]    Report Status 03/19/2021 FINAL  Final   Organism ID, Bacteria METHICILLIN RESISTANT STAPHYLOCOCCUS AUREUS  Final      Susceptibility   Methicillin resistant staphylococcus aureus - MIC*    CIPROFLOXACIN >=8 RESISTANT Resistant     ERYTHROMYCIN >=8 RESISTANT Resistant     GENTAMICIN <=0.5 SENSITIVE Sensitive     OXACILLIN >=4 RESISTANT Resistant     TETRACYCLINE <=1 SENSITIVE Sensitive     VANCOMYCIN <=0.5 SENSITIVE Sensitive     TRIMETH/SULFA >=320 RESISTANT Resistant     CLINDAMYCIN >=8 RESISTANT Resistant  RIFAMPIN <=0.5 SENSITIVE Sensitive     Inducible Clindamycin NEGATIVE Sensitive     * ABUNDANT METHICILLIN RESISTANT STAPHYLOCOCCUS AUREUS  Aerobic/Anaerobic Culture w Gram Stain (surgical/deep wound)     Status: None (Preliminary result)   Collection Time: 03/18/21  1:38 PM   Specimen: Abscess  Result Value Ref Range Status   Specimen Description ABSCESS  Final   Special Requests DRAIN CHEST WALL  Final   Gram Stain   Final    ABUNDANT WBC PRESENT, PREDOMINANTLY PMN ABUNDANT GRAM POSITIVE COCCI IN CLUSTERS Performed at [facility], [address], [place], [place] [address]    Culture   Final    ABUNDANT METHICILLIN RESISTANT STAPHYLOCOCCUS AUREUS NO ANAEROBES ISOLATED; CULTURE IN PROGRESS FOR 5 DAYS    Report Status PENDING  Incomplete   Organism ID, Bacteria METHICILLIN RESISTANT STAPHYLOCOCCUS AUREUS  Final      Susceptibility    Methicillin resistant staphylococcus aureus - MIC*    CIPROFLOXACIN >=8 RESISTANT Resistant     ERYTHROMYCIN >=8 RESISTANT Resistant     GENTAMICIN <=0.5 SENSITIVE Sensitive     OXACILLIN >=4 RESISTANT Resistant     TETRACYCLINE <=1 SENSITIVE Sensitive     VANCOMYCIN <=0.5 SENSITIVE Sensitive     TRIMETH/SULFA >=320 RESISTANT Resistant     CLINDAMYCIN >=8 RESISTANT Resistant     RIFAMPIN <=0.5 SENSITIVE Sensitive     Inducible Clindamycin NEGATIVE Sensitive     * ABUNDANT METHICILLIN RESISTANT STAPHYLOCOCCUS AUREUS  Aerobic/Anaerobic Culture w Gram Stain (surgical/deep wound)     Status: None (Preliminary result)   Collection Time: 03/19/21  3:07 PM   Specimen: Wound; Abscess  Result Value Ref Range Status   Specimen Description ABSCESS RIGHT CHEST  Final   Special Requests NONE  Final   Gram Stain   Final    FEW WBC PRESENT, PREDOMINANTLY PMN FEW GRAM POSITIVE COCCI IN PAIRS IN CLUSTERS Performed at [facility], [address], [place], [place] [address]    Culture   Final    FEW METHICILLIN RESISTANT STAPHYLOCOCCUS AUREUS NO ANAEROBES ISOLATED; CULTURE IN PROGRESS FOR 5 DAYS    Report Status PENDING  Incomplete   Organism ID, Bacteria METHICILLIN RESISTANT STAPHYLOCOCCUS AUREUS  Final      Susceptibility   Methicillin resistant staphylococcus aureus - MIC*    CIPROFLOXACIN >=8 RESISTANT Resistant     ERYTHROMYCIN >=8 RESISTANT Resistant     GENTAMICIN <=0.5 SENSITIVE Sensitive     OXACILLIN >=4 RESISTANT Resistant     TETRACYCLINE <=1 SENSITIVE Sensitive     VANCOMYCIN 1 SENSITIVE Sensitive     TRIMETH/SULFA >=320 RESISTANT Resistant     CLINDAMYCIN >=8 RESISTANT Resistant     RIFAMPIN <=0.5 SENSITIVE Sensitive     Inducible Clindamycin NEGATIVE Sensitive     * FEW METHICILLIN RESISTANT STAPHYLOCOCCUS AUREUS    Radiology Reports CT CHEST W CONTRAST  Result Date: 03/17/2021 CLINICAL DATA:  Infected right chest wall hematoma EXAM: CT CHEST WITH CONTRAST  TECHNIQUE: Multidetector CT imaging of the chest was performed during intravenous contrast administration. CONTRAST:  75 mL OMNIPAQUE IOHEXOL 350 MG/ML SOLN COMPARISON:  03/13/2021 FINDINGS: Cardiovascular: Heart size within normal limits. Coronary artery calcifications seen throughout. No significant vascular abnormality identified. Mediastinum/Nodes: No enlarged mediastinal, hilar, or axillary lymph nodes. Lungs/Pleura: Trace bilateral pleural effusions with adjacent atelectasis. Upper Abdomen: No acute abnormality. Musculoskeletal: Previously seen right chest wall collection has decreased in size since prior examination, measuring approximately 7.4 x 7.1 x 3.7 cm compared  to 10.1 x 9.1 x 5.7 cm on prior exam from 03/13/2021. The collection is somewhat bilobed. The inferior portion of the right chest wall collection has decreased to a greater degree than the superior lobulation. Greater amount of air within the collection likely due to presence of drains. Lucency and irregularity of the right sixth and seventh ribs, anterior segment again noted. IMPRESSION: 1. Interval decrease in size of bilobed right anterolateral chest wall fluid collection. The inferior lobulation is nearly completely resolved with some fat stranding and subcutaneous emphysema remaining. The more superior lobulation has decreased in size since prior study from 03/13/2021. The overall size of the collections now measures 7.4 x 7.1 x 3.7 cm compared to 10.1 x 9.1 x 5.7 cm on 03/13/2021. 2. Lucency in the anterior segments of the right sixth and seventh ribs again seen. Findings are suspicious for osteomyelitis, however underlying pathologic fracture is also possible. Electronically Signed   By: Miachel Roux M.D.   On: 03/17/2021 14:05   CT Chest W Contrast  Result Date: 03/13/2021 CLINICAL DATA:  Follow-up right chest wall hematoma and fractures of the right sixth through eighth ribs. Diabetes. HIV. EXAM: CT CHEST WITH CONTRAST TECHNIQUE:  Multidetector CT imaging of the chest was performed during intravenous contrast administration. CONTRAST:  134mL OMNIPAQUE IOHEXOL 300 MG/ML  SOLN COMPARISON:  03/09/2021 FINDINGS: Cardiovascular: Heart size remains normal. No pericardial fluid. Coronary artery calcification as seen previously. Aortic atherosclerotic calcification as seen previously. Mediastinum/Nodes: Normal Lungs/Pleura: Scarring/atelectasis at the lung bases left more than right unchanged since the previous study. Small areas of patchy density in the superior segment of the right lower lobe unchanged from the study of 4 days ago. No new or progressive pulmonary finding. No pneumothorax. Upper Abdomen: No upper abdominal injury or acute finding. Musculoskeletal: There is increasing size of the right chest wall collections, primarily centered around the right sixth and eighth anterolateral ribs. Those ribs show a pattern of irregular destruction that look more like pathologic fractures due to infection rather than traumatic fractures. Air bubbles are now present within the collections. There is mild extrapleural involvement in side of the ribcage, without frank breakthrough into the pleural space. Most of the collections manifest external to the ribcage. No new areas of involvement are seen. IMPRESSION: Increasing size of the right chest wall collections, primarily centered around the right sixth and eighth anterolateral ribs, with development of air bubbles. Those ribs show a pattern of irregular destruction that have progressed and look more like pathologic fractures due to infection rather than traumatic fractures. Air bubbles are now present within the collections and they probably represent abscesses. There is mild extrapleural involvement on the inner side of the ribcage, without frank breakthrough into the pleural space. The majority of the collections are on the outer side of the ribcage. No new areas of involvement are seen. Case discussed  with Dr. Regenia Skeeter at 1419 hours. Aortic Atherosclerosis (ICD10-I70.0). Electronically Signed   By: Nelson Chimes M.D.   On: 03/13/2021 14:19   CT Chest W Contrast  Result Date: 03/09/2021 CLINICAL DATA:  Fall 3 weeks prior with right rib fractures and persistent chest pain. EXAM: CT CHEST WITH CONTRAST TECHNIQUE: Multidetector CT imaging of the chest was performed during intravenous contrast administration. CONTRAST:  77mL OMNIPAQUE IOHEXOL 300 MG/ML  SOLN COMPARISON:  Chest radiograph from earlier today. 11/22/2019 chest CT angiogram. FINDINGS: Cardiovascular: Normal heart size. No significant pericardial effusion/thickening. Three-vessel coronary atherosclerosis. Atherosclerotic nonaneurysmal thoracic aorta. Top-normal caliber main pulmonary artery (  3.0 cm diameter). No central pulmonary emboli. Mediastinum/Nodes: No discrete thyroid nodules. Unremarkable esophagus. No pathologically enlarged axillary, mediastinal or hilar lymph nodes. Lungs/Pleura: No pneumothorax. No pleural effusion. No acute consolidative airspace disease, lung masses or significant pulmonary nodules. Mild platelike atelectasis in the anterior basilar left lower lobe. Mild patchy ground-glass opacity in peripheral posterior right mid lung. Upper abdomen: No acute abnormality. Musculoskeletal: No aggressive appearing focal osseous lesions. Nondisplaced acute anterior right sixth, seventh and eighth rib fractures with surrounding chest wall hematoma measuring up to 8.1 x 4.9 cm in maximum axial dimensions at the level of the anterior right sixth rib fracture (series 2/image 114). Moderate thoracic spondylosis. IMPRESSION: 1. Nondisplaced acute anterior right sixth, seventh and eighth rib fractures with surrounding chest wall hematoma. No pneumothorax or hemothorax. No discrete bone lesions. Clinical follow-up advised to ensure resolution of the right chest wall hematoma. Any need for follow-up imaging should be based on clinical assessment.  2. Mild patchy ground-glass opacity in the peripheral posterior right mid lung, nonspecific, favor mild pulmonary contusion. 3. Mild platelike atelectasis at the left lung base. 4. Three-vessel coronary atherosclerosis. 5. Aortic Atherosclerosis (ICD10-I70.0). Electronically Signed   By: Ilona Sorrel M.D.   On: 03/09/2021 13:03   IR US Guide Bx Asp/Drain  Result Date: 03/14/2021 INDICATION: 70 year old with history of fall and right rib fractures. Patient developed right chest hematomas. Patient is complaining of pain and recent CT demonstrates gas within the hematomas. Findings are concerning for infected hematomas. EXAM: PLACEMENT OF SUPERFICIAL RIGHT ANTERIOR CHEST WALL DRAIN USING ULTRASOUND GUIDANCE x 2 MEDICATIONS: Moderate sedation ANESTHESIA/SEDATION: Fentanyl 100 mcg IV; Versed 2.0 mg IV Moderate Sedation Time:  29 minutes The patient was continuously monitored during the procedure by the interventional radiology nurse under my direct supervision. COMPLICATIONS: None immediate. PROCEDURE: Informed written consent was obtained from the patient after a thorough discussion of the procedural risks, benefits and alternatives. All questions were addressed. Maximal Sterile Barrier Technique was utilized including caps, mask, sterile gowns, sterile gloves, sterile drape, hand hygiene and skin antiseptic. A timeout was performed prior to the initiation of the procedure. The right anterior chest wall hematomas were evaluated with ultrasound. Complex collections containing a small amount of fluid were obtained. The right anterior chest was prepped with chlorhexidine and sterile field was created. Attention was initially directed to the more inferior or caudal anterior chest wall collection. Skin was anesthetized with 1% lidocaine. Using ultrasound guidance, an 18 gauge trocar needle was directed into the collection and pink purulent fluid was aspirated. Superstiff Amplatz wire was advanced into the complex  collection and the tract was dilated to accommodate a 10 [place] drain. Catheter was sutured to the skin and attached to a suction bulb. A sample of fluid was sent for culture. A second area more cephalad was targeted with ultrasound guidance. Skin was anesthetized with 1% lidocaine. Using ultrasound guidance, an 18 gauge trocar needle was directed into the complex collection. Again, purulent fluid was aspirated. Superstiff Amplatz wire was advanced into this collection and a 10 French drain was placed. Additional purulent fluid was aspirated and the catheter was sutured to skin and attached to a suction bulb. Dressings were placed over both drains. FINDINGS: Patient has palpable hematomas in the right anterior chest and upper abdomen region. Ultrasound demonstrates heterogeneous collections in both areas containing a small amount of compressible fluid. There are echogenic areas within the collections compatible with known gas. 10 French drains were placed in both areas and purulent fluid  was draining from both collections. Both collections are very complex and compatible with infected hematomas. IMPRESSION: Ultrasound-guided placement of 2 percutaneous drains within the superficial right anterior chest wall collections. Findings are compatible with infected hematomas. Fluid sample was sent for culture. Electronically Signed   By: Markus Daft M.D.   On: 03/14/2021 18:37   IR US Guide Bx Asp/Drain  Result Date: 03/14/2021 INDICATION: 70 year old with history of fall and right rib fractures. Patient developed right chest hematomas. Patient is complaining of pain and recent CT demonstrates gas within the hematomas. Findings are concerning for infected hematomas. EXAM: PLACEMENT OF SUPERFICIAL RIGHT ANTERIOR CHEST WALL DRAIN USING ULTRASOUND GUIDANCE x 2 MEDICATIONS: Moderate sedation ANESTHESIA/SEDATION: Fentanyl 100 mcg IV; Versed 2.0 mg IV Moderate Sedation Time:  29 minutes The patient was continuously monitored  during the procedure by the interventional radiology nurse under my direct supervision. COMPLICATIONS: None immediate. PROCEDURE: Informed written consent was obtained from the patient after a thorough discussion of the procedural risks, benefits and alternatives. All questions were addressed. Maximal Sterile Barrier Technique was utilized including caps, mask, sterile gowns, sterile gloves, sterile drape, hand hygiene and skin antiseptic. A timeout was performed prior to the initiation of the procedure. The right anterior chest wall hematomas were evaluated with ultrasound. Complex collections containing a small amount of fluid were obtained. The right anterior chest was prepped with chlorhexidine and sterile field was created. Attention was initially directed to the more inferior or caudal anterior chest wall collection. Skin was anesthetized with 1% lidocaine. Using ultrasound guidance, an 18 gauge trocar needle was directed into the collection and pink purulent fluid was aspirated. Superstiff Amplatz wire was advanced into the complex collection and the tract was dilated to accommodate a 10 [place] drain. Catheter was sutured to the skin and attached to a suction bulb. A sample of fluid was sent for culture. A second area more cephalad was targeted with ultrasound guidance. Skin was anesthetized with 1% lidocaine. Using ultrasound guidance, an 18 gauge trocar needle was directed into the complex collection. Again, purulent fluid was aspirated. Superstiff Amplatz wire was advanced into this collection and a 10 French drain was placed. Additional purulent fluid was aspirated and the catheter was sutured to skin and attached to a suction bulb. Dressings were placed over both drains. FINDINGS: Patient has palpable hematomas in the right anterior chest and upper abdomen region. Ultrasound demonstrates heterogeneous collections in both areas containing a small amount of compressible fluid. There are echogenic areas  within the collections compatible with known gas. 10 French drains were placed in both areas and purulent fluid was draining from both collections. Both collections are very complex and compatible with infected hematomas. IMPRESSION: Ultrasound-guided placement of 2 percutaneous drains within the superficial right anterior chest wall collections. Findings are compatible with infected hematomas. Fluid sample was sent for culture. Electronically Signed   By: Markus Daft M.D.   On: 03/14/2021 18:37   DG Chest Port 1 View  Result Date: 03/13/2021 CLINICAL DATA:  Sepsis, right rib pain. EXAM: PORTABLE CHEST 1 VIEW COMPARISON:  March 09, 2021. FINDINGS: The heart size and mediastinal contours are within normal limits. Both lungs are clear. No pneumothorax or pleural effusion is noted. The visualized skeletal structures are unremarkable. IMPRESSION: No active disease. Electronically Signed   By: Marijo Conception M.D.   On: 03/13/2021 14:11   DG Chest Portable 1 View  Result Date: 03/09/2021 CLINICAL DATA:  Chest pain. Chest Arctic hurting late yesterday. Complains of  right-sided sternal pain and chest pain. EXAM: PORTABLE CHEST 1 VIEW COMPARISON:  None. FINDINGS: The heart size and mediastinal contours are within normal limits. Both lungs are clear. Blunting of the left costophrenic angle is noted, new from prior studies. The visualized skeletal structures are unremarkable. IMPRESSION: New blunting of left costophrenic angle may reflect small effusion. The lungs are otherwise clear. No signs of interstitial edema. Electronically Signed   By: Kerby Moors M.D.   On: 03/09/2021 11:26   [place] IMAGE GUIDED DRAINAGE BY PERCUTANEOUS CATHETER  Result Date: 03/18/2021 INDICATION: History of right-sided rib fractures complicated by development of infected adjacent hematomas, post ultrasound-guided placement of two percutaneous drainage catheters on 03/14/2021. Unfortunately, one of the percutaneous drainage catheters was  inadvertently removed with subsequent chest CT performed 03/17/2021 demonstrating a persistent undrained complex fluid collection involving the anterior aspect of the right chest wall. As such, request made for ultrasound-guided aspiration and/or drainage catheter placement for infection source control purposes. EXAM: 1. ULTRASOUND-GUIDED RIGHT CHEST WALL PERCUTANEOUS DRAINAGE CATHETER PLACEMENT X2 2. ULTRASOUND-GUIDED RIGHT CHEST WALL ABSCESS ASPIRATION COMPARISON:  Chest CT-03/17/2021; 03/13/2021; ultrasound-guided right chest wall percutaneous drainage catheter placement x2-03/14/2021 MEDICATIONS: The patient is currently admitted to the hospital and receiving intravenous antibiotics. The antibiotics were administered within an appropriate time frame prior to the initiation of the procedure. ANESTHESIA/SEDATION: Moderate (conscious) sedation was employed during this procedure. A total of Versed 4 mg and Fentanyl 200 mcg was administered intravenously. Moderate Sedation Time: 41 minutes. The patient's level of consciousness and vital signs were monitored continuously by radiology nursing throughout the procedure under my direct supervision. CONTRAST:  None COMPLICATIONS: None immediate. PROCEDURE: Informed written consent was obtained from the patient after a discussion of the risks, benefits and alternatives to treatment. Preprocedural ultrasound scanning demonstrated complex fluid involving the anterior aspect of the right chest wall compatible with the findings seen on preceding chest CT. A timeout was performed prior to the initiation of the procedure. The skin overlying the right anterior chest was prepped and draped in the usual sterile fashion. The overlying soft tissues were anesthetized with 1% lidocaine with epinephrine. Under direct ultrasound guidance, a 18 gauge trocar needle was advanced into the complex abscess within the right chest wall, inferior to the nipple. A short Amplatz wire was coiled  within the collection. The track was dilated ultimately allowing placement of a 10 French percutaneous catheter. Multiple ultrasound images were saved procedural documentation purposes. Next, approximately 10 cc of purulent fluid was aspirated from the drainage catheter Sonographic evaluation demonstrates a residual complex fluid collection and as such the more medial component of the collection was accessed with an 18 gauge trocar needle. A short Amplatz wire was coiled within the collection and an additional 10 French percutaneous drainage catheter was placed under ultrasound guidance. Multiple ultrasound images were saved procedural documentation purposes. Next, approximately 30 cc of purulent fluid was aspirated from this more medially positioned chest wall drain. A representative sample was capped and sent to the laboratory for analysis. Next, the lateral component of the right chest wall collection was accessed with an 18 gauge trocar needle however only a small amount of bloody fluid was able to be aspirated. As such, a short Amplatz wire was coiled within the collection and the trocar needle was exchanged for a Yueh sheath catheter which was utilized to aspirate approximately 3 cc of thick bloody fluid. At this time, approximately 70 cc of additional blood-tinged purulent fluid was able to be expressed from the  site of the previously removed percutaneous drainage catheter. Drainage catheters were secured at the entrance site within interrupted sutures and drainage catheters were connected to JP bulbs. Dressings were applied. The patient tolerated the procedure well without immediate postprocedural complication. IMPRESSION: 1. Successful ultrasound-guided placement of 2 two additional right anterior chest wall percutaneous drainage catheters yielding a total of 40 cc of purulent fluid. A representative aspirated sample was sent to the laboratory for analysis. 2. Successful ultrasound-guided aspiration of  approximately 3 cc of thick bloody fluid from the more lateral component of the right anterior chest wall complex hematoma. 3. Successful bedside expression of approximately 70 cc of purulent fluid from the entrance site of the recently removed percutaneous drainage catheter. Electronically Signed   By: Sandi Mariscal M.D.   On: 03/18/2021 15:29   [place] IMAGE GUIDED DRAINAGE BY PERCUTANEOUS CATHETER  Result Date: 03/18/2021 INDICATION: History of right-sided rib fractures complicated by development of infected adjacent hematomas, post ultrasound-guided placement of two percutaneous drainage catheters on 03/14/2021. Unfortunately, one of the percutaneous drainage catheters was inadvertently removed with subsequent chest CT performed 03/17/2021 demonstrating a persistent undrained complex fluid collection involving the anterior aspect of the right chest wall. As such, request made for ultrasound-guided aspiration and/or drainage catheter placement for infection source control purposes. EXAM: 1. ULTRASOUND-GUIDED RIGHT CHEST WALL PERCUTANEOUS DRAINAGE CATHETER PLACEMENT X2 2. ULTRASOUND-GUIDED RIGHT CHEST WALL ABSCESS ASPIRATION COMPARISON:  Chest CT-03/17/2021; 03/13/2021; ultrasound-guided right chest wall percutaneous drainage catheter placement x2-03/14/2021 MEDICATIONS: The patient is currently admitted to the hospital and receiving intravenous antibiotics. The antibiotics were administered within an appropriate time frame prior to the initiation of the procedure. ANESTHESIA/SEDATION: Moderate (conscious) sedation was employed during this procedure. A total of Versed 4 mg and Fentanyl 200 mcg was administered intravenously. Moderate Sedation Time: 41 minutes. The patient's level of consciousness and vital signs were monitored continuously by radiology nursing throughout the procedure under my direct supervision. CONTRAST:  None COMPLICATIONS: None immediate. PROCEDURE: Informed written consent was obtained from the  patient after a discussion of the risks, benefits and alternatives to treatment. Preprocedural ultrasound scanning demonstrated complex fluid involving the anterior aspect of the right chest wall compatible with the findings seen on preceding chest CT. A timeout was performed prior to the initiation of the procedure. The skin overlying the right anterior chest was prepped and draped in the usual sterile fashion. The overlying soft tissues were anesthetized with 1% lidocaine with epinephrine. Under direct ultrasound guidance, a 18 gauge trocar needle was advanced into the complex abscess within the right chest wall, inferior to the nipple. A short Amplatz wire was coiled within the collection. The track was dilated ultimately allowing placement of a 10 French percutaneous catheter. Multiple ultrasound images were saved procedural documentation purposes. Next, approximately 10 cc of purulent fluid was aspirated from the drainage catheter Sonographic evaluation demonstrates a residual complex fluid collection and as such the more medial component of the collection was accessed with an 18 gauge trocar needle. A short Amplatz wire was coiled within the collection and an additional 10 French percutaneous drainage catheter was placed under ultrasound guidance. Multiple ultrasound images were saved procedural documentation purposes. Next, approximately 30 cc of purulent fluid was aspirated from this more medially positioned chest wall drain. A representative sample was capped and sent to the laboratory for analysis. Next, the lateral component of the right chest wall collection was accessed with an 18 gauge trocar needle however only a small amount of bloody fluid was able  to be aspirated. As such, a short Amplatz wire was coiled within the collection and the trocar needle was exchanged for a Yueh sheath catheter which was utilized to aspirate approximately 3 cc of thick bloody fluid. At this time, approximately 70 cc of  additional blood-tinged purulent fluid was able to be expressed from the site of the previously removed percutaneous drainage catheter. Drainage catheters were secured at the entrance site within interrupted sutures and drainage catheters were connected to JP bulbs. Dressings were applied. The patient tolerated the procedure well without immediate postprocedural complication. IMPRESSION: 1. Successful ultrasound-guided placement of 2 two additional right anterior chest wall percutaneous drainage catheters yielding a total of 40 cc of purulent fluid. A representative aspirated sample was sent to the laboratory for analysis. 2. Successful ultrasound-guided aspiration of approximately 3 cc of thick bloody fluid from the more lateral component of the right anterior chest wall complex hematoma. 3. Successful bedside expression of approximately 70 cc of purulent fluid from the entrance site of the recently removed percutaneous drainage catheter. Electronically Signed   By: Sandi Mariscal M.D.   On: 03/18/2021 15:29   [place] FINE NEEDLE ASP 1ST LESION  Result Date: 03/18/2021 INDICATION: History of right-sided rib fractures complicated by development of infected adjacent hematomas, post ultrasound-guided placement of two percutaneous drainage catheters on 03/14/2021. Unfortunately, one of the percutaneous drainage catheters was inadvertently removed with subsequent chest CT performed 03/17/2021 demonstrating a persistent undrained complex fluid collection involving the anterior aspect of the right chest wall. As such, request made for ultrasound-guided aspiration and/or drainage catheter placement for infection source control purposes. EXAM: 1. ULTRASOUND-GUIDED RIGHT CHEST WALL PERCUTANEOUS DRAINAGE CATHETER PLACEMENT X2 2. ULTRASOUND-GUIDED RIGHT CHEST WALL ABSCESS ASPIRATION COMPARISON:  Chest CT-03/17/2021; 03/13/2021; ultrasound-guided right chest wall percutaneous drainage catheter placement x2-03/14/2021 MEDICATIONS:  The patient is currently admitted to the hospital and receiving intravenous antibiotics. The antibiotics were administered within an appropriate time frame prior to the initiation of the procedure. ANESTHESIA/SEDATION: Moderate (conscious) sedation was employed during this procedure. A total of Versed 4 mg and Fentanyl 200 mcg was administered intravenously. Moderate Sedation Time: 41 minutes. The patient's level of consciousness and vital signs were monitored continuously by radiology nursing throughout the procedure under my direct supervision. CONTRAST:  None COMPLICATIONS: None immediate. PROCEDURE: Informed written consent was obtained from the patient after a discussion of the risks, benefits and alternatives to treatment. Preprocedural ultrasound scanning demonstrated complex fluid involving the anterior aspect of the right chest wall compatible with the findings seen on preceding chest CT. A timeout was performed prior to the initiation of the procedure. The skin overlying the right anterior chest was prepped and draped in the usual sterile fashion. The overlying soft tissues were anesthetized with 1% lidocaine with epinephrine. Under direct ultrasound guidance, a 18 gauge trocar needle was advanced into the complex abscess within the right chest wall, inferior to the nipple. A short Amplatz wire was coiled within the collection. The track was dilated ultimately allowing placement of a 10 French percutaneous catheter. Multiple ultrasound images were saved procedural documentation purposes. Next, approximately 10 cc of purulent fluid was aspirated from the drainage catheter Sonographic evaluation demonstrates a residual complex fluid collection and as such the more medial component of the collection was accessed with an 18 gauge trocar needle. A short Amplatz wire was coiled within the collection and an additional 10 French percutaneous drainage catheter was placed under ultrasound guidance. Multiple  ultrasound images were saved procedural documentation purposes. Next, approximately  30 cc of purulent fluid was aspirated from this more medially positioned chest wall drain. A representative sample was capped and sent to the laboratory for analysis. Next, the lateral component of the right chest wall collection was accessed with an 18 gauge trocar needle however only a small amount of bloody fluid was able to be aspirated. As such, a short Amplatz wire was coiled within the collection and the trocar needle was exchanged for a Yueh sheath catheter which was utilized to aspirate approximately 3 cc of thick bloody fluid. At this time, approximately 70 cc of additional blood-tinged purulent fluid was able to be expressed from the site of the previously removed percutaneous drainage catheter. Drainage catheters were secured at the entrance site within interrupted sutures and drainage catheters were connected to JP bulbs. Dressings were applied. The patient tolerated the procedure well without immediate postprocedural complication. IMPRESSION: 1. Successful ultrasound-guided placement of 2 two additional right anterior chest wall percutaneous drainage catheters yielding a total of 40 cc of purulent fluid. A representative aspirated sample was sent to the laboratory for analysis. 2. Successful ultrasound-guided aspiration of approximately 3 cc of thick bloody fluid from the more lateral component of the right anterior chest wall complex hematoma. 3. Successful bedside expression of approximately 70 cc of purulent fluid from the entrance site of the recently removed percutaneous drainage catheter. Electronically Signed   By: Sandi Mariscal M.D.   On: 03/18/2021 15:29

## 2021-03-22 NOTE — Plan of Care (Signed)
°  Problem: Education: °Goal: Knowledge of General Education information will improve °Description: Including pain rating scale, medication(s)/side effects and non-pharmacologic comfort measures °Outcome: Progressing °  °Problem: Health Behavior/Discharge Planning: °Goal: Ability to manage health-related needs will improve °Outcome: Progressing °  °Problem: Clinical Measurements: °Goal: Ability to maintain clinical measurements within normal limits will improve °Outcome: Progressing °Goal: Respiratory complications will improve °Outcome: Progressing °  °Problem: Activity: °Goal: Risk for activity intolerance will decrease °Outcome: Progressing °  °Problem: Pain Managment: °Goal: General experience of comfort will improve °Outcome: Progressing °  °

## 2021-03-23 DIAGNOSIS — A419 Sepsis, unspecified organism: Secondary | ICD-10-CM | POA: Diagnosis not present

## 2021-03-23 DIAGNOSIS — B9562 Methicillin resistant Staphylococcus aureus infection as the cause of diseases classified elsewhere: Secondary | ICD-10-CM | POA: Diagnosis not present

## 2021-03-23 DIAGNOSIS — L02213 Cutaneous abscess of chest wall: Secondary | ICD-10-CM | POA: Diagnosis not present

## 2021-03-23 DIAGNOSIS — M869 Osteomyelitis, unspecified: Secondary | ICD-10-CM

## 2021-03-23 DIAGNOSIS — B182 Chronic viral hepatitis C: Secondary | ICD-10-CM | POA: Diagnosis not present

## 2021-03-23 DIAGNOSIS — R6520 Severe sepsis without septic shock: Secondary | ICD-10-CM | POA: Diagnosis not present

## 2021-03-23 DIAGNOSIS — N179 Acute kidney failure, unspecified: Secondary | ICD-10-CM | POA: Diagnosis not present

## 2021-03-23 LAB — BRAIN NATRIURETIC PEPTIDE: B Natriuretic Peptide: 84.1 pg/mL (ref 0.0–100.0)

## 2021-03-23 LAB — CBC WITH DIFFERENTIAL/PLATELET
Abs Immature Granulocytes: 0.02 10*3/uL (ref 0.00–0.07)
Basophils Absolute: 0 10*3/uL (ref 0.0–0.1)
Basophils Relative: 0 %
Eosinophils Absolute: 0.1 10*3/uL (ref 0.0–0.5)
Eosinophils Relative: 2 %
HCT: 30.9 % — ABNORMAL LOW (ref 39.0–52.0)
Hemoglobin: 10.3 g/dL — ABNORMAL LOW (ref 13.0–17.0)
Immature Granulocytes: 0 %
Lymphocytes Relative: 26 %
Lymphs Abs: 1.5 10*3/uL (ref 0.7–4.0)
MCH: 32.7 pg (ref 26.0–34.0)
MCHC: 33.3 g/dL (ref 30.0–36.0)
MCV: 98.1 fL (ref 80.0–100.0)
Monocytes Absolute: 0.6 10*3/uL (ref 0.1–1.0)
Monocytes Relative: 11 %
Neutro Abs: 3.4 10*3/uL (ref 1.7–7.7)
Neutrophils Relative %: 61 %
Platelets: 314 10*3/uL (ref 150–400)
RBC: 3.15 MIL/uL — ABNORMAL LOW (ref 4.22–5.81)
RDW: 13.2 % (ref 11.5–15.5)
WBC: 5.7 10*3/uL (ref 4.0–10.5)
nRBC: 0 % (ref 0.0–0.2)

## 2021-03-23 LAB — COMPREHENSIVE METABOLIC PANEL
ALT: 9 U/L (ref 0–44)
AST: 15 U/L (ref 15–41)
Albumin: 2.3 g/dL — ABNORMAL LOW (ref 3.5–5.0)
Alkaline Phosphatase: 80 U/L (ref 38–126)
Anion gap: 7 (ref 5–15)
BUN: 23 mg/dL (ref 8–23)
CO2: 30 mmol/L (ref 22–32)
Calcium: 8.7 mg/dL — ABNORMAL LOW (ref 8.9–10.3)
Chloride: 97 mmol/L — ABNORMAL LOW (ref 98–111)
Creatinine, Ser: 1.2 mg/dL (ref 0.61–1.24)
GFR, Estimated: >60 mL/min (ref >60)
Glucose, Bld: 127 mg/dL — ABNORMAL HIGH (ref 70–99)
Potassium: 4.5 mmol/L (ref 3.5–5.1)
Sodium: 134 mmol/L — ABNORMAL LOW (ref 135–145)
Total Bilirubin: 0.6 mg/dL (ref 0.3–1.2)
Total Protein: 6.2 g/dL — ABNORMAL LOW (ref 6.5–8.1)

## 2021-03-23 LAB — GLUCOSE, CAPILLARY
Glucose-Capillary: 148 mg/dL — ABNORMAL HIGH (ref 70–99)
Glucose-Capillary: 110 mg/dL — ABNORMAL HIGH (ref 70–99)
Glucose-Capillary: 131 mg/dL — ABNORMAL HIGH (ref 70–99)
Glucose-Capillary: 156 mg/dL — ABNORMAL HIGH (ref 70–99)
Glucose-Capillary: 160 mg/dL — ABNORMAL HIGH (ref 70–99)

## 2021-03-23 LAB — SEDIMENTATION RATE: Sed Rate: 83 mm/hr — ABNORMAL HIGH (ref 0–16)

## 2021-03-23 LAB — C-REACTIVE PROTEIN: CRP: 2.7 mg/dL — ABNORMAL HIGH (ref <1.0)

## 2021-03-23 LAB — MAGNESIUM: Magnesium: 2 mg/dL (ref 1.7–2.4)

## 2021-03-23 MED ORDER — SODIUM CHLORIDE 0.9 % IV SOLN
8.0000 mg/kg | INTRAVENOUS | Status: AC
  Administered 2021-03-23 – 2021-04-09 (×19): 700 mg via INTRAVENOUS
  Filled 2021-03-23 (×20): qty 14

## 2021-03-23 NOTE — Plan of Care (Signed)
°  Problem: Education: °Goal: Knowledge of General Education information will improve °Description: Including pain rating scale, medication(s)/side effects and non-pharmacologic comfort measures °Outcome: Progressing °  °Problem: Health Behavior/Discharge Planning: °Goal: Ability to manage health-related needs will improve °Outcome: Progressing °  °Problem: Clinical Measurements: °Goal: Ability to maintain clinical measurements within normal limits will improve °Outcome: Progressing °Goal: Respiratory complications will improve °Outcome: Progressing °  °Problem: Activity: °Goal: Risk for activity intolerance will decrease °Outcome: Progressing °  °Problem: Pain Managment: °Goal: General experience of comfort will improve °Outcome: Progressing °  °

## 2021-03-23 NOTE — Progress Notes (Addendum)
      [address]       [employer] [address]             [phone]       4 Days Post-Op Procedure(s) (LRB): INCISION AND DRAINAGE RIGHT CHEST ABSCESS (Right)  Subjective: Patient has pain at right chest (wound VAC)  Objective: Vital signs in last 24 hours: Temp:  [98 F (36.7 C)-98.1 F (36.7 C)] 98 F (36.7 C) (04/04 0500) Pulse Rate:  [61-67] 65 (04/04 0500) Resp:  [15-16] 16 (04/04 0500) BP: (106-134)/(59-83) 106/59 (04/04 0500) SpO2:  [96 %-100 %] 98 % (04/04 0500)     Intake/Output from previous day: 04/03 0701 - 04/04 0700 In: -  Out: 800 [Urine:800]   Physical Exam:  Cardiovascular: RRR. Pulmonary: Clear to auscultation bilaterally Wounds: VAC in place   Lab Results: BJY:NWGNFA Labs    03/21/21 0252 03/23/21 0203  WBC 8.8 5.7  HGB 10.1* 10.3*  HCT 29.8* 30.9*  PLT 289 314   BMET:  Recent Labs    03/21/21 0252 03/23/21 0203  NA 132* 134*  K 4.3 4.5  CL 97* 97*  CO2 27 30  GLUCOSE 111* 127*  BUN 23 23  CREATININE 1.21 1.20  CALCIUM 8.3* 8.7*    PT/INR: No results for input(s): LABPROT, INR in the last 72 hours. ABG:  INR: Will add last result for INR, ABG once components are confirmed Will add last 4 CBG results once components are confirmed  Assessment/Plan:  1. CV - SR. On Lopressor 25 mg bid 2.  Pulmonary - On room air. CT Encourage incentive spirometer. 3. DM-CBGs 149/141/148. On Insulin. Per primary 4. History of HIV-on Mavyret and Triumeq 5. ID- on Vancomycin. Likely change wound VAC in am 6. History of chronic pain, narcotic use  Donielle M ZimmermanPA-C 03/23/2021,7:46 AM [phone]  Agree with above Will plan to change wound vac in next day or so.  Charlotta Lapaglia Bary Leriche

## 2021-03-23 NOTE — Progress Notes (Signed)
PROGRESS NOTE                                                                                                                                                                                                             Patient Demographics:    Brian Malone, is a 70 y.o. male, DOB - [birth year], KNL:[id number]  Outpatient Primary MD for the patient is [name], [name], MD    LOS - 10  Admit date - 03/13/2021    Chief Complaint  Patient presents with  . Rib Injury       Brief Narrative (HPI from H&P)  70 year old male with past medical history of hepatitis C, hypertension, hyperlipidemia, diabetes mellitus type 2, chronic pain syndrome, intravenous drug use (abstinent since 2019), major depressive disorder and HIV who had a trip and fall in his yard on 02/22/2021 after which he hurt his right-sided chest wall, at that time he was diagnosed with right-sided rib cage fracture and injury, he was seen in the ER a few times since then and was treated conservatively, his pain and discomfort continued to get worse and he presented again to [facility] on  03/13/2021 CT scan suggested that he had right chest wall abscess formation and cellulitis with concerns for sepsis.   Subjective:   Patient in bed sleeping comfortably, woken up, denies any headache, chest pain better according to him, no shortness of breath or focal weakness.   Assessment  & Plan :     1. 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 Kipp Brood cardiothoracic surgery was consulted in the ER who suggested ultrasound-guided drainage of the abscess by IR, this was done by IR on  03/14/2021, infected hematoma/abscess which was drained, fluid grew MRSA .  Despite having drains placed by IR his infection continue to get worse, eventually he was taken to the OR by Dr. Kipp Brood on 03/19/2021 with I&D and Wound Vac placement, clinically looks better.  Continue vancomycin also consult ID as he has osteomyelitis that would require 6 weeks of antibiotics, preferably oral regimen for the long-term.  Encouraged the patient to sit up in chair  in the daytime use I-S and flutter valve for pulmonary toiletry.  Will advance activity and titrate down oxygen as possible.   2.  Chronic pain and narcotic use.  Supportive care as needed for acute discomfort, avoid overuse, as needed Narcan added.  Patient counseled see above.  He does exhibit narcotic seeking behavior, has been warned multiple times as this can lead to accidental overdose and even death friend in the room on 23-Mar-2021 also made aware of the concerns. Kindly see nursing documentation as well.   3. HIV - Continue home regimen follows with [facility] ID department.  4.  Anxiety and depression.  Home medications continued.  Currently not homicidal or suicidal.  5.  GERD.  On PPI.  6.  BPH.  On Flomax.  7.  AKI.  Due to ATN from sepsis, resolved after hydration with IV fluids.  8.  Hypertension.  Blood pressure soft, skipped today's blood pressure medications will monitor closely.   9.  DM type II.  On sliding scale monitor and adjust  Lab Results  Component Value Date   HGBA1C 8.3 (H) 03/14/2021   CBG (last 3)  Recent Labs    03/22/21 2043 03/23/21 0614 03/23/21 0751  GLUCAP 141* 148* 110*          Condition - Extremely Guarded  Family Communication  : None present bedside  Code Status :  Full  Consults  :  IR, cardiothoracic surgery, ID  PUD Prophylaxis : PPI   Procedures  :     Right chest wall I&D by Dr. Kipp Brood 03/19/21  US guided chest wall abscess drined by IR 03/14/21   CT -   Increasing size of the right chest wall collections, primarily centered around the right sixth and eighth anterolateral ribs, with development of air bubbles. Those ribs show a pattern of irregular destruction that have progressed and look more like pathologic fractures due to infection rather than traumatic fractures. Air bubbles are now present within the collections and they probably represent abscesses. There is mild extrapleural involvement on the inner side of the ribcage, without frank breakthrough into the pleural space. The majority of the collections are on the outer side of the ribcage. No new areas of involvement are seen.  CT repeat 03/17/21 - 1. Interval decrease in size of bilobed right anterolateral chest wall fluid collection. The inferior lobulation is nearly completely resolved with some fat stranding and subcutaneous emphysema remaining. The more superior lobulation has decreased in size since prior study from 03/13/2021. The overall size of the collections now measures 7.4 x 7.1 x 3.7 cm compared to 10.1 x 9.1 x 5.7 cm on 03/13/2021. 2. Lucency in the anterior segments of the right sixth and seventh ribs again seen. Findings are suspicious for osteomyelitis, however underlying pathologic fracture is also possible.      Disposition Plan  :    Status is: Inpatient  Remains inpatient appropriate because:IV treatments appropriate due to intensity of illness or inability to take PO   Dispo: The patient is from: Home              Anticipated d/c is to: Home              Patient currently is not medically stable to d/c.   Difficult to place patient No  DVT Prophylaxis  :   Heparin   Lab Results  Component Value Date   PLT 314 03/23/2021    Diet :  Diet Order  Diet regular Room service appropriate? Yes; Fluid consistency: Thin  Diet effective now                  Inpatient Medications  Scheduled Meds: . (feeding supplement) PROSource Plus  30 mL Oral TID WC   . abacavir-dolutegravir-lamiVUDine  1 tablet Oral Daily  . busPIRone  30 mg Oral BID  . clonazepam  0.5 mg Oral TID  . ezetimibe  10 mg Oral Daily  . feeding supplement  237 mL Oral BID BM  . feeding supplement (GLUCERNA SHAKE)  237 mL Oral Q24H  . Glecaprevir-Pibrentasvir  3 tablet Oral Daily  . heparin injection (subcutaneous)  5,000 Units Subcutaneous Q8H  . insulin aspart  0-15 Units Subcutaneous TID AC & HS  . metoprolol tartrate  25 mg Oral BID  . multivitamin with minerals  1 tablet Oral Daily  . pantoprazole  40 mg Oral QAC breakfast  . sodium chloride flush  5 mL Intracatheter Q8H  . tamsulosin  0.4 mg Oral QHS  . tiZANidine  2 mg Oral QHS   Continuous Infusions: . sodium chloride Stopped (03/13/21 2106)  . vancomycin 1,250 mg (03/22/21 1501)   PRN Meds:.sodium chloride, acetaminophen **OR** [DISCONTINUED] acetaminophen, antiseptic oral rinse, HYDROcodone-acetaminophen, HYDROmorphone (DILAUDID) injection **OR** [DISCONTINUED]  HYDROmorphone (DILAUDID) injection, naLOXone (NARCAN)  injection, [DISCONTINUED] ondansetron **OR** ondansetron (ZOFRAN) IV, polyethylene glycol, valACYclovir  Antibiotics  :    Anti-infectives (From admission, onward)   Start     Dose/Rate Route Frequency Ordered Stop   03/22/21 1200  vancomycin (VANCOREADY) IVPB 1250 mg/250 mL        1,250 mg 166.7 mL/hr over 90 Minutes Intravenous Every 24 hours 03/21/21 1319     03/20/21 1000  Glecaprevir-Pibrentasivir 100-40 mg (Mavyret) tabs 3 tablets - patient's own supply        3 tablet Oral Daily 03/20/21 0733     03/15/21 1200  vancomycin (VANCOREADY) IVPB 1500 mg/300 mL  Status:  Discontinued        1,500 mg 150 mL/hr over 120 Minutes Intravenous Every 24 hours 03/15/21 0732 03/21/21 1319   03/14/21 1200  vancomycin (VANCOREADY) IVPB 1250 mg/250 mL  Status:  Discontinued        1,250 mg 166.7 mL/hr over 90 Minutes Intravenous Every 24 hours 03/13/21 1359 03/15/21 0732   03/14/21 1023  valACYclovir  (VALTREX) tablet 1,000 mg       Note to Pharmacy: Trig neuralgia flare     1,000 mg Oral 3 times daily PRN 03/14/21 1023     03/14/21 1000  abacavir-dolutegravir-lamiVUDine (TRIUMEQ) 600-50-300 MG per tablet 1 tablet        1 tablet Oral Daily 03/13/21 2320     03/14/21 0200  meropenem (MERREM) 1 g in sodium chloride 0.9 % 100 mL IVPB  Status:  Discontinued        1 g 200 mL/hr over 30 Minutes Intravenous Every 8 hours 03/13/21 2337 03/16/21 0719   03/14/21 0100  ceFEPIme (MAXIPIME) 2 g in sodium chloride 0.9 % 100 mL IVPB  Status:  Discontinued       Note to Pharmacy: Cefepime 2 g IV q12h for CrCl < 60 mL/min   2 g 200 mL/hr over 30 Minutes Intravenous Every 12 hours 03/13/21 2326 03/13/21 2331   03/13/21 2000  piperacillin-tazobactam (ZOSYN) IVPB 3.375 g  Status:  Discontinued        3.375 g 12.5 mL/hr over 240 Minutes Intravenous Every 8 hours 03/13/21 1402 03/13/21 2320  03/13/21 1430  clindamycin (CLEOCIN) IVPB 900 mg        900 mg 100 mL/hr over 30 Minutes Intravenous  Once 03/13/21 1418 03/13/21 1548   03/13/21 1230  vancomycin (VANCOCIN) IVPB 1000 mg/200 mL premix        1,000 mg 200 mL/hr over 60 Minutes Intravenous  Once 03/13/21 1229 03/13/21 1506   03/13/21 1230  piperacillin-tazobactam (ZOSYN) IVPB 3.375 g        3.375 g 100 mL/hr over 30 Minutes Intravenous  Once 03/13/21 1229 03/13/21 1400       Time Spent in minutes  30   Lala Lund M.D on 03/23/2021 at 9:58 AM  To page go to [email or web address]   [facility] -  Office  [phone]    See all Orders from today for further details    Objective:   Vitals:   03/22/21 0505 03/22/21 1300 03/22/21 2038 03/23/21 0500  BP: 105/65 112/60 134/83 (!) 106/59  Pulse: 60 61 67 65  Resp: 16 16 15 16   Temp: 98.3 F (36.8 C) 98 F (36.7 C) 98.1 F (36.7 C) 98 F (36.7 C)  TempSrc: Axillary Oral Axillary Axillary  SpO2: 100% 100% 96% 98%  Weight:      Height:        Wt Readings from Last 3 Encounters:   03/19/21 85.2 kg  03/09/21 77.1 kg  01/22/21 72.6 kg     Intake/Output Summary (Last 24 hours) at 03/23/2021 0958 Last data filed at 03/23/2021 0752 Gross per 24 hour  Intake --  Output 1000 ml  Net -1000 ml   Physical Exam  Sleeping in bed comfortably, No new F.N deficits,  [place].AT,PERRAL Supple Neck,No JVD, No cervical lymphadenopathy appriciated.  Symmetrical Chest wall movement, Good air movement bilaterally, right chest wall wound VAC in place, RRR,No Gallops, Rubs or new Murmurs, No Parasternal Heave +ve B.Sounds, Abd Soft, No tenderness, No organomegaly appriciated, No rebound - guarding or rigidity. No Cyanosis, Clubbing or edema, No new Rash or bruise   Data Review:    CBC Recent Labs  Lab 03/18/21 0151 03/19/21 0103 03/20/21 0036 03/21/21 0252 03/23/21 0203  WBC 8.0 9.4 7.8 8.8 5.7  HGB 10.2* 11.4* 11.2* 10.1* 10.3*  HCT 29.8* 33.5* 33.6* 29.8* 30.9*  PLT 206 214 234 289 314  MCV 96.1 95.7 95.7 96.4 98.1  MCH 32.9 32.6 31.9 32.7 32.7  MCHC 34.2 34.0 33.3 33.9 33.3  RDW 12.9 12.8 12.8 12.9 13.2  LYMPHSABS 1.1 1.1 0.4* 1.7 1.5  MONOABS 1.0 1.1* 0.3 0.8 0.6  EOSABS 0.1 0.1 0.0 0.1 0.1  BASOSABS 0.0 0.0 0.0 0.0 0.0    Recent Labs  Lab 03/17/21 0116 03/18/21 0151 03/19/21 0103 03/20/21 0036 03/21/21 0252 03/23/21 0203  NA 134* 133* 131* 132* 132* 134*  K 3.5 3.8 4.4 5.2* 4.3 4.5  CL 102 100 97* 97* 97* 97*  CO2 24 26 29 25 27 30   GLUCOSE 198* 104* 124* 266* 111* 127*  BUN 14 10 8 16 23 23   CREATININE 1.15 1.10 1.13 1.27* 1.21 1.20  CALCIUM 7.7* 8.1* 8.2* 8.3* 8.3* 8.7*  AST 12* 13* 13* 13* 14* 15  ALT 6 8 8 11 9 9   ALKPHOS 98 82 90 87 81 80  BILITOT 0.6 0.6 0.9 0.7 0.6 0.6  ALBUMIN 1.6* 1.7* 1.8* 1.9* 2.1* 2.3*  MG 1.6* 1.7 1.7 1.7 2.0 2.0  CRP  --   --   --   --   --  2.7*  PROCALCITON 0.35 0.26 0.27 0.12 0.10  --   BNP  --   --   --   --   --  84.1     ------------------------------------------------------------------------------------------------------------------ No results for input(s): CHOL, HDL, LDLCALC, TRIG, CHOLHDL, LDLDIRECT in the last 72 hours.  Lab Results  Component Value Date   HGBA1C 8.3 (H) 03/14/2021   ------------------------------------------------------------------------------------------------------------------ No results for input(s): TSH, T4TOTAL, T3FREE, THYROIDAB in the last 72 hours.  Invalid input(s): FREET3  Cardiac Enzymes No results for input(s): CKMB, TROPONINI, MYOGLOBIN in the last 168 hours.  Invalid input(s): CK ------------------------------------------------------------------------------------------------------------------    Component Value Date/Time   BNP 84.1 03/23/2021 0203    Micro Results Recent Results (from the past 240 hour(s))  Blood Culture (routine x 2)     Status: None   Collection Time: 03/13/21 12:25 PM   Specimen: Right Antecubital; Blood  Result Value Ref Range Status   Specimen Description   Final    RIGHT ANTECUBITAL BLOOD Performed at [facility], [address] [place], [place] [address]    Special Requests   Final    BOTTLES DRAWN AEROBIC AND ANAEROBIC Blood Culture results may not be optimal due to an inadequate volume of blood received in culture bottles Performed at [facility], [address] [place], [place] [address]    Culture   Final    NO GROWTH 5 DAYS Performed at [facility], [place] [address] [place], [place] [address]    Report Status 03/18/2021 FINAL  Final  Blood Culture (routine x 2)     Status: None   Collection Time: 03/13/21  1:15 PM   Specimen: BLOOD RIGHT HAND  Result Value Ref Range Status   Specimen Description   Final    BLOOD RIGHT HAND BLOOD Performed at [facility], [address] [place], [place] [address]    Special Requests   Final    BOTTLES DRAWN AEROBIC AND ANAEROBIC Blood  Culture results may not be optimal due to an inadequate volume of blood received in culture bottles Performed at [facility], [address] [place], [place] [address]    Culture   Final    NO GROWTH 5 DAYS Performed at [facility], [place] [address] [place], [place] [address]    Report Status 03/18/2021 FINAL  Final  Resp Panel by RT-PCR (Flu A&B, Covid) Nasopharyngeal Swab     Status: None   Collection Time: 03/13/21  2:01 PM   Specimen: Nasopharyngeal Swab; Nasopharyngeal(NP) swabs in vial transport medium  Result Value Ref Range Status   SARS Coronavirus 2 by RT PCR NEGATIVE NEGATIVE Final    Comment: (NOTE) SARS-CoV-2 target nucleic acids are NOT DETECTED.  The SARS-CoV-2 RNA is generally detectable in upper respiratory specimens during the acute phase of infection. The lowest concentration of SARS-CoV-2 viral copies this assay can detect is 138 copies/mL. A negative result does not preclude SARS-Cov-2 infection and should not be used as the sole basis for treatment or other patient management decisions. A negative result may occur with  improper specimen collection/handling, submission of specimen other than nasopharyngeal swab, presence of viral mutation(s) within the areas targeted by this assay, and inadequate number of viral copies(<138 copies/mL). A negative result must be combined with clinical observations, patient history, and epidemiological information. The expected result is Negative.  Fact Sheet for Patients:  EntrepreneurPulse.com.au  Fact Sheet for Healthcare Providers:  IncredibleEmployment.be  This test is no t yet approved or cleared by the [place] and  has been authorized for detection and/or diagnosis of SARS-CoV-2 by FDA under an Emergency Use Authorization (EUA). This EUA will remain  in effect (meaning this test can be used) for the duration of the COVID-19 declaration under Section  564(b)(1) of the Act, 21 U.S.C.section 360bbb-3(b)(1), unless the authorization is terminated  or revoked sooner.       Influenza A by PCR NEGATIVE NEGATIVE Final   Influenza B by PCR NEGATIVE NEGATIVE Final    Comment: (NOTE) The Xpert Xpress SARS-CoV-2/FLU/RSV plus assay is intended as an aid in the diagnosis of influenza from Nasopharyngeal swab specimens and should not be used as a sole basis for treatment. Nasal washings and aspirates are unacceptable for Xpert Xpress SARS-CoV-2/FLU/RSV testing.  Fact Sheet for Patients: EntrepreneurPulse.com.au  Fact Sheet for Healthcare Providers: IncredibleEmployment.be  This test is not yet approved or cleared by the [place] FDA and has been authorized for detection and/or diagnosis of SARS-CoV-2 by FDA under an Emergency Use Authorization (EUA). This EUA will remain in effect (meaning this test can be used) for the duration of the COVID-19 declaration under Section 564(b)(1) of the Act, 21 U.S.C. section 360bbb-3(b)(1), unless the authorization is terminated or revoked.  Performed at [facility], [address] [place], [place] [address]   MRSA PCR Screening     Status: Abnormal   Collection Time: 03/13/21 11:51 PM   Specimen: Nasal Mucosa; Nasopharyngeal  Result Value Ref Range Status   MRSA by PCR POSITIVE (A) NEGATIVE Final    Comment:        The GeneXpert MRSA Assay (FDA approved for NASAL specimens only), is one component of a comprehensive MRSA colonization surveillance program. It is not intended to diagnose MRSA infection nor to guide or monitor treatment for MRSA infections. RESULT CALLED TO, READ BACK BY AND VERIFIED WITHDorna Bloom RN 03/14/21 0432 JDW Performed at [place] [address], [place], [place] [address]   Aerobic/Anaerobic Culture (surgical/deep wound)     Status: None   Collection Time: 03/14/21  1:23 PM   Specimen: Abscess  Result  Value Ref Range Status   Specimen Description ABSCESS  Final   Special Requests NONE  Final   Gram Stain   Final    ABUNDANT WBC PRESENT, PREDOMINANTLY PMN ABUNDANT GRAM POSITIVE COCCI IN CLUSTERS    Culture   Final    ABUNDANT METHICILLIN RESISTANT STAPHYLOCOCCUS AUREUS NO ANAEROBES ISOLATED Performed at [facility], [address], [place],  [address]    Report Status 03/19/2021 FINAL  Final   Organism ID, Bacteria METHICILLIN RESISTANT STAPHYLOCOCCUS AUREUS  Final      Susceptibility   Methicillin resistant staphylococcus aureus - MIC*    CIPROFLOXACIN >=8 RESISTANT Resistant     ERYTHROMYCIN >=8 RESISTANT Resistant     GENTAMICIN <=0.5 SENSITIVE Sensitive     OXACILLIN >=4 RESISTANT Resistant     TETRACYCLINE <=1 SENSITIVE Sensitive     VANCOMYCIN <=0.5 SENSITIVE Sensitive     TRIMETH/SULFA >=320 RESISTANT Resistant     CLINDAMYCIN >=8 RESISTANT Resistant     RIFAMPIN <=0.5 SENSITIVE Sensitive     Inducible Clindamycin NEGATIVE Sensitive     * ABUNDANT METHICILLIN RESISTANT STAPHYLOCOCCUS AUREUS  Aerobic/Anaerobic Culture w Gram Stain (surgical/deep wound)     Status: None (Preliminary result)   Collection Time: 03/18/21  1:38 PM  Specimen: Abscess  Result Value Ref Range Status   Specimen Description ABSCESS  Final   Special Requests DRAIN CHEST WALL  Final   Gram Stain   Final    ABUNDANT WBC PRESENT, PREDOMINANTLY PMN ABUNDANT GRAM POSITIVE COCCI IN CLUSTERS Performed at [facility], [place] [address] [place], [place] [address]    Culture   Final    ABUNDANT METHICILLIN RESISTANT STAPHYLOCOCCUS AUREUS NO ANAEROBES ISOLATED; CULTURE IN PROGRESS FOR 5 DAYS    Report Status PENDING  Incomplete   Organism ID, Bacteria METHICILLIN RESISTANT STAPHYLOCOCCUS AUREUS  Final      Susceptibility   Methicillin resistant staphylococcus aureus - MIC*    CIPROFLOXACIN >=8 RESISTANT Resistant     ERYTHROMYCIN >=8 RESISTANT Resistant     GENTAMICIN <=0.5  SENSITIVE Sensitive     OXACILLIN >=4 RESISTANT Resistant     TETRACYCLINE <=1 SENSITIVE Sensitive     VANCOMYCIN <=0.5 SENSITIVE Sensitive     TRIMETH/SULFA >=320 RESISTANT Resistant     CLINDAMYCIN >=8 RESISTANT Resistant     RIFAMPIN <=0.5 SENSITIVE Sensitive     Inducible Clindamycin NEGATIVE Sensitive     * ABUNDANT METHICILLIN RESISTANT STAPHYLOCOCCUS AUREUS  Fungus Culture With Stain     Status: None (Preliminary result)   Collection Time: 03/19/21  3:07 PM   Specimen: Wound; Abscess  Result Value Ref Range Status   Fungus Stain Final report  Final    Comment: (NOTE) Performed At: [facility] [place], [place] [id number] Rush Farmer MD [id number]    Fungus (Mycology) Culture PENDING  Incomplete   Fungal Source ABSCESS  Final    Comment: RIGHT CHEST Performed at [facility], [place] [address] [place], [place] [address]   Aerobic/Anaerobic Culture w Gram Stain (surgical/deep wound)     Status: None (Preliminary result)   Collection Time: 03/19/21  3:07 PM   Specimen: Wound; Abscess  Result Value Ref Range Status   Specimen Description ABSCESS RIGHT CHEST  Final   Special Requests NONE  Final   Gram Stain   Final    FEW WBC PRESENT, PREDOMINANTLY PMN FEW GRAM POSITIVE COCCI IN PAIRS IN CLUSTERS Performed at [facility], [address] [place], [place] [address]    Culture   Final    FEW METHICILLIN RESISTANT STAPHYLOCOCCUS AUREUS NO ANAEROBES ISOLATED; CULTURE IN PROGRESS FOR 5 DAYS    Report Status PENDING  Incomplete   Organism ID, Bacteria METHICILLIN RESISTANT STAPHYLOCOCCUS AUREUS  Final      Susceptibility   Methicillin resistant staphylococcus aureus - MIC*    CIPROFLOXACIN >=8 RESISTANT Resistant     ERYTHROMYCIN >=8 RESISTANT Resistant     GENTAMICIN <=0.5 SENSITIVE Sensitive     OXACILLIN >=4 RESISTANT Resistant     TETRACYCLINE <=1 SENSITIVE Sensitive     VANCOMYCIN 1 SENSITIVE Sensitive     TRIMETH/SULFA >=320  RESISTANT Resistant     CLINDAMYCIN >=8 RESISTANT Resistant     RIFAMPIN <=0.5 SENSITIVE Sensitive     Inducible Clindamycin NEGATIVE Sensitive     * FEW METHICILLIN RESISTANT STAPHYLOCOCCUS AUREUS  Fungus Culture Result     Status: None   Collection Time: 03/19/21  3:07 PM  Result Value Ref Range Status   Result 1 Comment  Final    Comment: (NOTE) KOH/Calcofluor preparation:  no fungus observed. Performed At: [facility] [place], [place] [id number] Rush Farmer MD OI:[id number]     Radiology Reports CT CHEST  W CONTRAST  Result Date: 03/22/2021 CLINICAL DATA:  70 year old male with chest pain and shortness of breath. EXAM: CT CHEST WITH CONTRAST TECHNIQUE: Multidetector CT imaging of the chest was performed during intravenous contrast administration. CONTRAST:  33mL OMNIPAQUE IOHEXOL 300 MG/ML  SOLN COMPARISON:  Chest CT dated 03/17/2021. FINDINGS: Cardiovascular: There is no cardiomegaly or pericardial effusion. There is 3 vessel coronary vascular calcification. There is mild atherosclerotic calcification of the thoracic aorta. No aneurysmal dilatation or dissection. The origins of the great vessels of the aortic arch appear patent as visualized. No pulmonary artery embolus identified. Mediastinum/Nodes: No hilar or mediastinal adenopathy. The esophagus is grossly unremarkable. No mediastinal fluid collection. Lungs/Pleura: Small bilateral pleural effusions with partial compressive atelectasis of the lower lobes. Pneumonia is not excluded. Clinical correlation is recommended. No lobar consolidation or pneumothorax. The central airways are patent. Upper Abdomen: No acute abnormality. Musculoskeletal: Degenerative changes of the spine. There is an area of inflammatory changes with small pockets of air in the right anterior chest wall along the anterior right sixth rib. There is fragmentation of the anterior right sixth rib concerning for osteomyelitis. Clinical correlation  is recommended. No drainable fluid collection or abscess identified. There is thickening of the adjacent pleura. There is a large skin wound in the lower right anterior chest wall with a wound VAC. IMPRESSION: 1. No CT evidence of pulmonary embolism. 2. Small bilateral pleural effusions with partial compressive atelectasis of the lower lobes. Pneumonia is not excluded. Clinical correlation is recommended. 3. Large skin wound in the lower right anterior chest wall with a wound VAC. 4. Destructive changes of the anterior right sixth rib concerning for osteomyelitis. Clinical correlation is recommended. No drainable fluid collection or abscess. 5. Aortic Atherosclerosis (ICD10-I70.0). Electronically Signed   By: Anner Crete M.D.   On: 03/22/2021 19:48   CT CHEST W CONTRAST  Result Date: 03/17/2021 CLINICAL DATA:  Infected right chest wall hematoma EXAM: CT CHEST WITH CONTRAST TECHNIQUE: Multidetector CT imaging of the chest was performed during intravenous contrast administration. CONTRAST:  75 mL OMNIPAQUE IOHEXOL 350 MG/ML SOLN COMPARISON:  03/13/2021 FINDINGS: Cardiovascular: Heart size within normal limits. Coronary artery calcifications seen throughout. No significant vascular abnormality identified. Mediastinum/Nodes: No enlarged mediastinal, hilar, or axillary lymph nodes. Lungs/Pleura: Trace bilateral pleural effusions with adjacent atelectasis. Upper Abdomen: No acute abnormality. Musculoskeletal: Previously seen right chest wall collection has decreased in size since prior examination, measuring approximately 7.4 x 7.1 x 3.7 cm compared to 10.1 x 9.1 x 5.7 cm on prior exam from 03/13/2021. The collection is somewhat bilobed. The inferior portion of the right chest wall collection has decreased to a greater degree than the superior lobulation. Greater amount of air within the collection likely due to presence of drains. Lucency and irregularity of the right sixth and seventh ribs, anterior segment  again noted. IMPRESSION: 1. Interval decrease in size of bilobed right anterolateral chest wall fluid collection. The inferior lobulation is nearly completely resolved with some fat stranding and subcutaneous emphysema remaining. The more superior lobulation has decreased in size since prior study from 03/13/2021. The overall size of the collections now measures 7.4 x 7.1 x 3.7 cm compared to 10.1 x 9.1 x 5.7 cm on 03/13/2021. 2. Lucency in the anterior segments of the right sixth and seventh ribs again seen. Findings are suspicious for osteomyelitis, however underlying pathologic fracture is also possible. Electronically Signed   By: Miachel Roux M.D.   On: 03/17/2021 14:05   CT Chest W Contrast  Result Date: 03/13/2021 CLINICAL DATA:  Follow-up right chest wall hematoma and fractures of the right sixth through eighth ribs. Diabetes. HIV. EXAM: CT CHEST WITH CONTRAST TECHNIQUE: Multidetector CT imaging of the chest was performed during intravenous contrast administration. CONTRAST:  111mL OMNIPAQUE IOHEXOL 300 MG/ML  SOLN COMPARISON:  03/09/2021 FINDINGS: Cardiovascular: Heart size remains normal. No pericardial fluid. Coronary artery calcification as seen previously. Aortic atherosclerotic calcification as seen previously. Mediastinum/Nodes: Normal Lungs/Pleura: Scarring/atelectasis at the lung bases left more than right unchanged since the previous study. Small areas of patchy density in the superior segment of the right lower lobe unchanged from the study of 4 days ago. No new or progressive pulmonary finding. No pneumothorax. Upper Abdomen: No upper abdominal injury or acute finding. Musculoskeletal: There is increasing size of the right chest wall collections, primarily centered around the right sixth and eighth anterolateral ribs. Those ribs show a pattern of irregular destruction that look more like pathologic fractures due to infection rather than traumatic fractures. Air bubbles are now present within  the collections. There is mild extrapleural involvement in side of the ribcage, without frank breakthrough into the pleural space. Most of the collections manifest external to the ribcage. No new areas of involvement are seen. IMPRESSION: Increasing size of the right chest wall collections, primarily centered around the right sixth and eighth anterolateral ribs, with development of air bubbles. Those ribs show a pattern of irregular destruction that have progressed and look more like pathologic fractures due to infection rather than traumatic fractures. Air bubbles are now present within the collections and they probably represent abscesses. There is mild extrapleural involvement on the inner side of the ribcage, without frank breakthrough into the pleural space. The majority of the collections are on the outer side of the ribcage. No new areas of involvement are seen. Case discussed with Dr. Regenia Skeeter at 1419 hours. Aortic Atherosclerosis (ICD10-I70.0). Electronically Signed   By: Nelson Chimes M.D.   On: 03/13/2021 14:19   CT Chest W Contrast  Result Date: 03/09/2021 CLINICAL DATA:  Fall 3 weeks prior with right rib fractures and persistent chest pain. EXAM: CT CHEST WITH CONTRAST TECHNIQUE: Multidetector CT imaging of the chest was performed during intravenous contrast administration. CONTRAST:  36mL OMNIPAQUE IOHEXOL 300 MG/ML  SOLN COMPARISON:  Chest radiograph from earlier today. 11/22/2019 chest CT angiogram. FINDINGS: Cardiovascular: Normal heart size. No significant pericardial effusion/thickening. Three-vessel coronary atherosclerosis. Atherosclerotic nonaneurysmal thoracic aorta. Top-normal caliber main pulmonary artery (3.0 cm diameter). No central pulmonary emboli. Mediastinum/Nodes: No discrete thyroid nodules. Unremarkable esophagus. No pathologically enlarged axillary, mediastinal or hilar lymph nodes. Lungs/Pleura: No pneumothorax. No pleural effusion. No acute consolidative airspace disease, lung  masses or significant pulmonary nodules. Mild platelike atelectasis in the anterior basilar left lower lobe. Mild patchy ground-glass opacity in peripheral posterior right mid lung. Upper abdomen: No acute abnormality. Musculoskeletal: No aggressive appearing focal osseous lesions. Nondisplaced acute anterior right sixth, seventh and eighth rib fractures with surrounding chest wall hematoma measuring up to 8.1 x 4.9 cm in maximum axial dimensions at the level of the anterior right sixth rib fracture (series 2/image 114). Moderate thoracic spondylosis. IMPRESSION: 1. Nondisplaced acute anterior right sixth, seventh and eighth rib fractures with surrounding chest wall hematoma. No pneumothorax or hemothorax. No discrete bone lesions. Clinical follow-up advised to ensure resolution of the right chest wall hematoma. Any need for follow-up imaging should be based on clinical assessment. 2. Mild patchy ground-glass opacity in the peripheral posterior right mid lung, nonspecific, favor mild  pulmonary contusion. 3. Mild platelike atelectasis at the left lung base. 4. Three-vessel coronary atherosclerosis. 5. Aortic Atherosclerosis (ICD10-I70.0). Electronically Signed   By: Ilona Sorrel M.D.   On: 03/09/2021 13:03   IR US Guide Bx Asp/Drain  Result Date: 03/14/2021 INDICATION: 70 year old with history of fall and right rib fractures. Patient developed right chest hematomas. Patient is complaining of pain and recent CT demonstrates gas within the hematomas. Findings are concerning for infected hematomas. EXAM: PLACEMENT OF SUPERFICIAL RIGHT ANTERIOR CHEST WALL DRAIN USING ULTRASOUND GUIDANCE x 2 MEDICATIONS: Moderate sedation ANESTHESIA/SEDATION: Fentanyl 100 mcg IV; Versed 2.0 mg IV Moderate Sedation Time:  29 minutes The patient was continuously monitored during the procedure by the interventional radiology nurse under my direct supervision. COMPLICATIONS: None immediate. PROCEDURE: Informed written consent was obtained  from the patient after a thorough discussion of the procedural risks, benefits and alternatives. All questions were addressed. Maximal Sterile Barrier Technique was utilized including caps, mask, sterile gowns, sterile gloves, sterile drape, hand hygiene and skin antiseptic. A timeout was performed prior to the initiation of the procedure. The right anterior chest wall hematomas were evaluated with ultrasound. Complex collections containing a small amount of fluid were obtained. The right anterior chest was prepped with chlorhexidine and sterile field was created. Attention was initially directed to the more inferior or caudal anterior chest wall collection. Skin was anesthetized with 1% lidocaine. Using ultrasound guidance, an 18 gauge trocar needle was directed into the collection and pink purulent fluid was aspirated. Superstiff Amplatz wire was advanced into the complex collection and the tract was dilated to accommodate a 10 [place] drain. Catheter was sutured to the skin and attached to a suction bulb. A sample of fluid was sent for culture. A second area more cephalad was targeted with ultrasound guidance. Skin was anesthetized with 1% lidocaine. Using ultrasound guidance, an 18 gauge trocar needle was directed into the complex collection. Again, purulent fluid was aspirated. Superstiff Amplatz wire was advanced into this collection and a 10 French drain was placed. Additional purulent fluid was aspirated and the catheter was sutured to skin and attached to a suction bulb. Dressings were placed over both drains. FINDINGS: Patient has palpable hematomas in the right anterior chest and upper abdomen region. Ultrasound demonstrates heterogeneous collections in both areas containing a small amount of compressible fluid. There are echogenic areas within the collections compatible with known gas. 10 French drains were placed in both areas and purulent fluid was draining from both collections. Both collections are  very complex and compatible with infected hematomas. IMPRESSION: Ultrasound-guided placement of 2 percutaneous drains within the superficial right anterior chest wall collections. Findings are compatible with infected hematomas. Fluid sample was sent for culture. Electronically Signed   By: Markus Daft M.D.   On: 03/14/2021 18:37   IR US Guide Bx Asp/Drain  Result Date: 03/14/2021 INDICATION: 70 year old with history of fall and right rib fractures. Patient developed right chest hematomas. Patient is complaining of pain and recent CT demonstrates gas within the hematomas. Findings are concerning for infected hematomas. EXAM: PLACEMENT OF SUPERFICIAL RIGHT ANTERIOR CHEST WALL DRAIN USING ULTRASOUND GUIDANCE x 2 MEDICATIONS: Moderate sedation ANESTHESIA/SEDATION: Fentanyl 100 mcg IV; Versed 2.0 mg IV Moderate Sedation Time:  29 minutes The patient was continuously monitored during the procedure by the interventional radiology nurse under my direct supervision. COMPLICATIONS: None immediate. PROCEDURE: Informed written consent was obtained from the patient after a thorough discussion of the procedural risks, benefits and alternatives. All questions were addressed.  Maximal Sterile Barrier Technique was utilized including caps, mask, sterile gowns, sterile gloves, sterile drape, hand hygiene and skin antiseptic. A timeout was performed prior to the initiation of the procedure. The right anterior chest wall hematomas were evaluated with ultrasound. Complex collections containing a small amount of fluid were obtained. The right anterior chest was prepped with chlorhexidine and sterile field was created. Attention was initially directed to the more inferior or caudal anterior chest wall collection. Skin was anesthetized with 1% lidocaine. Using ultrasound guidance, an 18 gauge trocar needle was directed into the collection and pink purulent fluid was aspirated. Superstiff Amplatz wire was advanced into the complex  collection and the tract was dilated to accommodate a 10 [place] drain. Catheter was sutured to the skin and attached to a suction bulb. A sample of fluid was sent for culture. A second area more cephalad was targeted with ultrasound guidance. Skin was anesthetized with 1% lidocaine. Using ultrasound guidance, an 18 gauge trocar needle was directed into the complex collection. Again, purulent fluid was aspirated. Superstiff Amplatz wire was advanced into this collection and a 10 French drain was placed. Additional purulent fluid was aspirated and the catheter was sutured to skin and attached to a suction bulb. Dressings were placed over both drains. FINDINGS: Patient has palpable hematomas in the right anterior chest and upper abdomen region. Ultrasound demonstrates heterogeneous collections in both areas containing a small amount of compressible fluid. There are echogenic areas within the collections compatible with known gas. 10 French drains were placed in both areas and purulent fluid was draining from both collections. Both collections are very complex and compatible with infected hematomas. IMPRESSION: Ultrasound-guided placement of 2 percutaneous drains within the superficial right anterior chest wall collections. Findings are compatible with infected hematomas. Fluid sample was sent for culture. Electronically Signed   By: Markus Daft M.D.   On: 03/14/2021 18:37   DG Chest Port 1 View  Result Date: 03/13/2021 CLINICAL DATA:  Sepsis, right rib pain. EXAM: PORTABLE CHEST 1 VIEW COMPARISON:  March 09, 2021. FINDINGS: The heart size and mediastinal contours are within normal limits. Both lungs are clear. No pneumothorax or pleural effusion is noted. The visualized skeletal structures are unremarkable. IMPRESSION: No active disease. Electronically Signed   By: Marijo Conception M.D.   On: 03/13/2021 14:11   DG Chest Portable 1 View  Result Date: 03/09/2021 CLINICAL DATA:  Chest pain. Chest Arctic hurting late  yesterday. Complains of right-sided sternal pain and chest pain. EXAM: PORTABLE CHEST 1 VIEW COMPARISON:  None. FINDINGS: The heart size and mediastinal contours are within normal limits. Both lungs are clear. Blunting of the left costophrenic angle is noted, new from prior studies. The visualized skeletal structures are unremarkable. IMPRESSION: New blunting of left costophrenic angle may reflect small effusion. The lungs are otherwise clear. No signs of interstitial edema. Electronically Signed   By: Kerby Moors M.D.   On: 03/09/2021 11:26   [place] IMAGE GUIDED DRAINAGE BY PERCUTANEOUS CATHETER  Result Date: 03/18/2021 INDICATION: History of right-sided rib fractures complicated by development of infected adjacent hematomas, post ultrasound-guided placement of two percutaneous drainage catheters on 03/14/2021. Unfortunately, one of the percutaneous drainage catheters was inadvertently removed with subsequent chest CT performed 03/17/2021 demonstrating a persistent undrained complex fluid collection involving the anterior aspect of the right chest wall. As such, request made for ultrasound-guided aspiration and/or drainage catheter placement for infection source control purposes. EXAM: 1. ULTRASOUND-GUIDED RIGHT CHEST WALL PERCUTANEOUS DRAINAGE CATHETER PLACEMENT X2  2. ULTRASOUND-GUIDED RIGHT CHEST WALL ABSCESS ASPIRATION COMPARISON:  Chest CT-03/17/2021; 03/13/2021; ultrasound-guided right chest wall percutaneous drainage catheter placement x2-03/14/2021 MEDICATIONS: The patient is currently admitted to the hospital and receiving intravenous antibiotics. The antibiotics were administered within an appropriate time frame prior to the initiation of the procedure. ANESTHESIA/SEDATION: Moderate (conscious) sedation was employed during this procedure. A total of Versed 4 mg and Fentanyl 200 mcg was administered intravenously. Moderate Sedation Time: 41 minutes. The patient's level of consciousness and vital signs  were monitored continuously by radiology nursing throughout the procedure under my direct supervision. CONTRAST:  None COMPLICATIONS: None immediate. PROCEDURE: Informed written consent was obtained from the patient after a discussion of the risks, benefits and alternatives to treatment. Preprocedural ultrasound scanning demonstrated complex fluid involving the anterior aspect of the right chest wall compatible with the findings seen on preceding chest CT. A timeout was performed prior to the initiation of the procedure. The skin overlying the right anterior chest was prepped and draped in the usual sterile fashion. The overlying soft tissues were anesthetized with 1% lidocaine with epinephrine. Under direct ultrasound guidance, a 18 gauge trocar needle was advanced into the complex abscess within the right chest wall, inferior to the nipple. A short Amplatz wire was coiled within the collection. The track was dilated ultimately allowing placement of a 10 French percutaneous catheter. Multiple ultrasound images were saved procedural documentation purposes. Next, approximately 10 cc of purulent fluid was aspirated from the drainage catheter Sonographic evaluation demonstrates a residual complex fluid collection and as such the more medial component of the collection was accessed with an 18 gauge trocar needle. A short Amplatz wire was coiled within the collection and an additional 10 French percutaneous drainage catheter was placed under ultrasound guidance. Multiple ultrasound images were saved procedural documentation purposes. Next, approximately 30 cc of purulent fluid was aspirated from this more medially positioned chest wall drain. A representative sample was capped and sent to the laboratory for analysis. Next, the lateral component of the right chest wall collection was accessed with an 18 gauge trocar needle however only a small amount of bloody fluid was able to be aspirated. As such, a short Amplatz wire  was coiled within the collection and the trocar needle was exchanged for a Yueh sheath catheter which was utilized to aspirate approximately 3 cc of thick bloody fluid. At this time, approximately 70 cc of additional blood-tinged purulent fluid was able to be expressed from the site of the previously removed percutaneous drainage catheter. Drainage catheters were secured at the entrance site within interrupted sutures and drainage catheters were connected to JP bulbs. Dressings were applied. The patient tolerated the procedure well without immediate postprocedural complication. IMPRESSION: 1. Successful ultrasound-guided placement of 2 two additional right anterior chest wall percutaneous drainage catheters yielding a total of 40 cc of purulent fluid. A representative aspirated sample was sent to the laboratory for analysis. 2. Successful ultrasound-guided aspiration of approximately 3 cc of thick bloody fluid from the more lateral component of the right anterior chest wall complex hematoma. 3. Successful bedside expression of approximately 70 cc of purulent fluid from the entrance site of the recently removed percutaneous drainage catheter. Electronically Signed   By: Sandi Mariscal M.D.   On: 03/18/2021 15:29   [place] IMAGE GUIDED DRAINAGE BY PERCUTANEOUS CATHETER  Result Date: 03/18/2021 INDICATION: History of right-sided rib fractures complicated by development of infected adjacent hematomas, post ultrasound-guided placement of two percutaneous drainage catheters on 03/14/2021. Unfortunately, one of the  percutaneous drainage catheters was inadvertently removed with subsequent chest CT performed 03/17/2021 demonstrating a persistent undrained complex fluid collection involving the anterior aspect of the right chest wall. As such, request made for ultrasound-guided aspiration and/or drainage catheter placement for infection source control purposes. EXAM: 1. ULTRASOUND-GUIDED RIGHT CHEST WALL PERCUTANEOUS DRAINAGE  CATHETER PLACEMENT X2 2. ULTRASOUND-GUIDED RIGHT CHEST WALL ABSCESS ASPIRATION COMPARISON:  Chest CT-03/17/2021; 03/13/2021; ultrasound-guided right chest wall percutaneous drainage catheter placement x2-03/14/2021 MEDICATIONS: The patient is currently admitted to the hospital and receiving intravenous antibiotics. The antibiotics were administered within an appropriate time frame prior to the initiation of the procedure. ANESTHESIA/SEDATION: Moderate (conscious) sedation was employed during this procedure. A total of Versed 4 mg and Fentanyl 200 mcg was administered intravenously. Moderate Sedation Time: 41 minutes. The patient's level of consciousness and vital signs were monitored continuously by radiology nursing throughout the procedure under my direct supervision. CONTRAST:  None COMPLICATIONS: None immediate. PROCEDURE: Informed written consent was obtained from the patient after a discussion of the risks, benefits and alternatives to treatment. Preprocedural ultrasound scanning demonstrated complex fluid involving the anterior aspect of the right chest wall compatible with the findings seen on preceding chest CT. A timeout was performed prior to the initiation of the procedure. The skin overlying the right anterior chest was prepped and draped in the usual sterile fashion. The overlying soft tissues were anesthetized with 1% lidocaine with epinephrine. Under direct ultrasound guidance, a 18 gauge trocar needle was advanced into the complex abscess within the right chest wall, inferior to the nipple. A short Amplatz wire was coiled within the collection. The track was dilated ultimately allowing placement of a 10 French percutaneous catheter. Multiple ultrasound images were saved procedural documentation purposes. Next, approximately 10 cc of purulent fluid was aspirated from the drainage catheter Sonographic evaluation demonstrates a residual complex fluid collection and as such the more medial component of  the collection was accessed with an 18 gauge trocar needle. A short Amplatz wire was coiled within the collection and an additional 10 French percutaneous drainage catheter was placed under ultrasound guidance. Multiple ultrasound images were saved procedural documentation purposes. Next, approximately 30 cc of purulent fluid was aspirated from this more medially positioned chest wall drain. A representative sample was capped and sent to the laboratory for analysis. Next, the lateral component of the right chest wall collection was accessed with an 18 gauge trocar needle however only a small amount of bloody fluid was able to be aspirated. As such, a short Amplatz wire was coiled within the collection and the trocar needle was exchanged for a Yueh sheath catheter which was utilized to aspirate approximately 3 cc of thick bloody fluid. At this time, approximately 70 cc of additional blood-tinged purulent fluid was able to be expressed from the site of the previously removed percutaneous drainage catheter. Drainage catheters were secured at the entrance site within interrupted sutures and drainage catheters were connected to JP bulbs. Dressings were applied. The patient tolerated the procedure well without immediate postprocedural complication. IMPRESSION: 1. Successful ultrasound-guided placement of 2 two additional right anterior chest wall percutaneous drainage catheters yielding a total of 40 cc of purulent fluid. A representative aspirated sample was sent to the laboratory for analysis. 2. Successful ultrasound-guided aspiration of approximately 3 cc of thick bloody fluid from the more lateral component of the right anterior chest wall complex hematoma. 3. Successful bedside expression of approximately 70 cc of purulent fluid from the entrance site of the recently removed percutaneous drainage  catheter. Electronically Signed   By: Sandi Mariscal M.D.   On: 03/18/2021 15:29   [place] FINE NEEDLE ASP 1ST LESION  Result  Date: 03/18/2021 INDICATION: History of right-sided rib fractures complicated by development of infected adjacent hematomas, post ultrasound-guided placement of two percutaneous drainage catheters on 03/14/2021. Unfortunately, one of the percutaneous drainage catheters was inadvertently removed with subsequent chest CT performed 03/17/2021 demonstrating a persistent undrained complex fluid collection involving the anterior aspect of the right chest wall. As such, request made for ultrasound-guided aspiration and/or drainage catheter placement for infection source control purposes. EXAM: 1. ULTRASOUND-GUIDED RIGHT CHEST WALL PERCUTANEOUS DRAINAGE CATHETER PLACEMENT X2 2. ULTRASOUND-GUIDED RIGHT CHEST WALL ABSCESS ASPIRATION COMPARISON:  Chest CT-03/17/2021; 03/13/2021; ultrasound-guided right chest wall percutaneous drainage catheter placement x2-03/14/2021 MEDICATIONS: The patient is currently admitted to the hospital and receiving intravenous antibiotics. The antibiotics were administered within an appropriate time frame prior to the initiation of the procedure. ANESTHESIA/SEDATION: Moderate (conscious) sedation was employed during this procedure. A total of Versed 4 mg and Fentanyl 200 mcg was administered intravenously. Moderate Sedation Time: 41 minutes. The patient's level of consciousness and vital signs were monitored continuously by radiology nursing throughout the procedure under my direct supervision. CONTRAST:  None COMPLICATIONS: None immediate. PROCEDURE: Informed written consent was obtained from the patient after a discussion of the risks, benefits and alternatives to treatment. Preprocedural ultrasound scanning demonstrated complex fluid involving the anterior aspect of the right chest wall compatible with the findings seen on preceding chest CT. A timeout was performed prior to the initiation of the procedure. The skin overlying the right anterior chest was prepped and draped in the usual sterile  fashion. The overlying soft tissues were anesthetized with 1% lidocaine with epinephrine. Under direct ultrasound guidance, a 18 gauge trocar needle was advanced into the complex abscess within the right chest wall, inferior to the nipple. A short Amplatz wire was coiled within the collection. The track was dilated ultimately allowing placement of a 10 French percutaneous catheter. Multiple ultrasound images were saved procedural documentation purposes. Next, approximately 10 cc of purulent fluid was aspirated from the drainage catheter Sonographic evaluation demonstrates a residual complex fluid collection and as such the more medial component of the collection was accessed with an 18 gauge trocar needle. A short Amplatz wire was coiled within the collection and an additional 10 French percutaneous drainage catheter was placed under ultrasound guidance. Multiple ultrasound images were saved procedural documentation purposes. Next, approximately 30 cc of purulent fluid was aspirated from this more medially positioned chest wall drain. A representative sample was capped and sent to the laboratory for analysis. Next, the lateral component of the right chest wall collection was accessed with an 18 gauge trocar needle however only a small amount of bloody fluid was able to be aspirated. As such, a short Amplatz wire was coiled within the collection and the trocar needle was exchanged for a Yueh sheath catheter which was utilized to aspirate approximately 3 cc of thick bloody fluid. At this time, approximately 70 cc of additional blood-tinged purulent fluid was able to be expressed from the site of the previously removed percutaneous drainage catheter. Drainage catheters were secured at the entrance site within interrupted sutures and drainage catheters were connected to JP bulbs. Dressings were applied. The patient tolerated the procedure well without immediate postprocedural complication. IMPRESSION: 1. Successful  ultrasound-guided placement of 2 two additional right anterior chest wall percutaneous drainage catheters yielding a total of 40 cc of purulent fluid. A representative  aspirated sample was sent to the laboratory for analysis. 2. Successful ultrasound-guided aspiration of approximately 3 cc of thick bloody fluid from the more lateral component of the right anterior chest wall complex hematoma. 3. Successful bedside expression of approximately 70 cc of purulent fluid from the entrance site of the recently removed percutaneous drainage catheter. Electronically Signed   By: Sandi Mariscal M.D.   On: 03/18/2021 15:29

## 2021-03-23 NOTE — Consult Note (Signed)
Brian Malone for Infectious Diseases                                                                                        Patient Identification: Patient Name: Brian Malone MRN: [id number] [place] Date: 03/13/2021 11:58 AM Today's Date: 03/23/2021 Reason for consult:  Requesting provider:   Principal Problem:   Sepsis ([place]) Active Problems:   HIV (human immunodeficiency virus infection) ([place])   Anxiety disorder   Essential hypertension   AKI (acute kidney injury) ([place])   Lactic acidosis   Abscess of chest wall   Mixed diabetic hyperlipidemia associated with type 2 diabetes mellitus ([place])   GERD without esophagitis   BPH (benign prostatic hyperplasia)   Chronic hepatitis C without hepatic coma ([place])   Protein-calorie malnutrition, severe   Antibiotics: Vancomycin 3/25- c                    Meropenem 3/25-3/27                    Total days of antibiotics 8 Triumeq  mavyret   Lines/Tubes: PIV  Assessment Right chest wall abscess status post ultrasound-guided drain placement by IR 3/26 and. chest wall debridement and wound VAC placement 3/31 by CT surgery.  Cultures are positive for MRSA.  Blood cultures are negative.  Anterior right sixth rib osteomyelitis  HIV well-controlled-follows up with a provider at [facility].  10/20/2020 HIV RNA not detected.  On Triumeq and reports compliance with ART for the most part.   Chronic hepatitis C-follows up with a provider at [facility], plan to complete 4 more weeks of Mavyret from 3/17. HCV RNA 3/17 not detected   History of IVDU  Recommendations  Continue vancomycin, pharmacy to dose. Will need 6 weeks of treatment given osteomyelitis of the associated rib from 3/31.  Can do initial 3 to 4 weeks of IV tx with a  possible switch to long-acting versus p.o. to complete the remaining duration of treatment given history of IVDU Follow-up CT surgery recs Monitor  CBC BMP and vancomycin trough Follow-up HIV RNA He will follow-up with his provider at [facility] for management of his HIV and hepatitis C  Rest of the management as per the primary team. Please call with questions or concerns.  Thank you for the consult   __________________________________________________________________________________________________________ HPI and Hospital Course: 70 year old male with past medical history of HIV on ART, hepatitis C on treatment, hypertension hyperlipidemia, DM type II, chronic pain syndrome, IVDU abstinent since 2019, major depression who presented to [facility] ED as a transfer from [place] for for concerns of right chest wall abscess in CT with concerns of sepsis after he presented with complaints of chest wall pain and shortness of breath.  Approximately a month ago patient stepped into a ditch and fell down landing directly on his right chest after which he experienced severe pain which worsened with inspiration associated with shortness of breath.  He came to see his PCP 2 days later which was worked up with an x-ray which was told to be unremarkable.  In the following  days he continued to have severe right chest wall pain and eventually presented to a local urgent care clinic on 3/11 where x-ray was done and revealed fractures of ribs 6 7 and 8.  He was given analgesics and discharged home.  However patient's right chest wall pain continue to worsen and he additionally have shortness of breath.  He again presented to the [place] emergency department on 3/21 and CT chest showed nondisplaced acute anterior right sixth, seventh and eighth rib fractures with surrounding chest wall hematoma.  Mild patchy groundglass opacity in the perifissural posterior right midlung favoring mild pulmonary contusion.  Discharged home with opiate-based analgesics.  Few days later, patient began to develop redness and swelling of the right lateral chest wall  along with severe right chest wall pain.  He did not have fever but had chills and night sweats.  He presented to the [place] on 3/25 when CT chest showed findings concerning for chest wall abscess and destruction of the ribs with concerns for osteomyelitis. patient was then transferred to [facility] ED.   In terms of his hepatitis C, he follows with a provider at [facility] and is currently taking Mavyret.  He is supposed to complete 4 more weeks of Mavyret from 03/05/21.  In terms of his HIV he seems to be well-controlled on Triumeq and follows up with a provider at [facility],   Patient underwent ultrasound-guided drain placement x2 on 3/26 with IR.  Operative findings complex hematoma in the right anterior chest and right upper abdomen.  Purulent fluid was aspirated from 2 separate areas and to 10 French drains placed.  cultures grew MRSA. Also underwent chest wall debridement, placement of cell matrix and wound vac on 3/31.  Operative findings mention gross purulence involving the right chest wall musculature final wound measured 15 into the 12 cm.  The wound was debrided down to healthy tissue just above the rib.  Cultures grew MRSA.  ROS: General- Denies fever, chills, loss of appetite and loss of weight HEENT - Denies headache, blurry vision, neck pain, sinus pain Chest - Denies any SOB or cough. RT CHEST WALL PAIN+ CVS- Denies any dizziness/lightheadedness, syncopal attacks, palpitations Abdomen- Denies any nausea, vomiting, abdominal pain, hematochezia and diarrhea Neuro - Denies any weakness, numbness, tingling sensation Psych - Denies any changes in mood irritability or depressive symptoms GU- Denies any burning, dysuria, hematuria or increased frequency of urination Skin - denies any rashes/lesions MSK - denies any joint pain/swelling or restricted ROM   Past Medical History:  Diagnosis Date  . Acute subdural hematoma ([place]) 10/28/2019  . Anxiety   . Cellulitis of right upper  extremity 11/04/2020  . Diabetes mellitus without complication ([place])   . History of COVID-19 05/26/2020  . HIV (human immunodeficiency virus infection) ([place])   . Hypertension   . Renal disorder    Past Surgical History:  Procedure Laterality Date  . ANKLE ARTHROSCOPY    . APPENDECTOMY    . BACK SURGERY     FUSION  . INCISION AND DRAINAGE ABSCESS Right 03/19/2021   Procedure: INCISION AND DRAINAGE RIGHT CHEST ABSCESS;  Surgeon: Lajuana Matte, MD;  Location: [place];  Service: Vascular;  Laterality: Right;  . IR US GUIDE BX ASP/DRAIN  03/14/2021  . IR US GUIDE BX ASP/DRAIN  03/14/2021  . PENILE PROSTHESIS  REMOVAL  05/2020   Removal due to abscess  . PENILE PROSTHESIS PLACEMENT  04/2020  . THORACOTOMY Right 2008  ?  Marland Kitchen  TONSILLECTOMY      Scheduled Meds: . (feeding supplement) PROSource Plus  30 mL Oral TID WC  . abacavir-dolutegravir-lamiVUDine  1 tablet Oral Daily  . busPIRone  30 mg Oral BID  . clonazepam  0.5 mg Oral TID  . ezetimibe  10 mg Oral Daily  . feeding supplement  237 mL Oral BID BM  . feeding supplement (GLUCERNA SHAKE)  237 mL Oral Q24H  . Glecaprevir-Pibrentasvir  3 tablet Oral Daily  . heparin injection (subcutaneous)  5,000 Units Subcutaneous Q8H  . insulin aspart  0-15 Units Subcutaneous TID AC & HS  . metoprolol tartrate  25 mg Oral BID  . multivitamin with minerals  1 tablet Oral Daily  . pantoprazole  40 mg Oral QAC breakfast  . sodium chloride flush  5 mL Intracatheter Q8H  . tamsulosin  0.4 mg Oral QHS  . tiZANidine  2 mg Oral QHS   Continuous Infusions: . sodium chloride Stopped (03/13/21 2106)  . vancomycin 1,250 mg (03/22/21 1501)   PRN Meds:.sodium chloride, acetaminophen **OR** [DISCONTINUED] acetaminophen, antiseptic oral rinse, HYDROcodone-acetaminophen, HYDROmorphone (DILAUDID) injection **OR** [DISCONTINUED]  HYDROmorphone (DILAUDID) injection, naLOXone (NARCAN)  injection, [DISCONTINUED] ondansetron **OR** ondansetron (ZOFRAN) IV,  polyethylene glycol, valACYclovir  Allergies  Allergen Reactions  . Adhesive [Tape] Other (See Comments)    "Tape will take off my skin, as will Band-Aids"  . Cyclobenzaprine Other (See Comments)    Hallucinations  . Duloxetine Hcl Other (See Comments)    Makes PTSD- induced nightmares more vivid   . Morphine Itching and Other (See Comments)    Hallucinations, aggression, and makes patient altered also      Social History   Socioeconomic History  . Marital status: Divorced    Spouse name: Not on file  . Number of children: 2  . Years of education: Not on file  . Highest education level: Bachelor's degree (e.g., BA, AB, BS)  Occupational History  . Not on file  Tobacco Use  . Smoking status: Never Smoker  . Smokeless tobacco: Never Used  Vaping Use  . Vaping Use: Never used  Substance and Sexual Activity  . Alcohol use: Not Currently  . Drug use: Never  . Sexual activity: Not on file  Other Topics Concern  . Not on file  Social History Narrative   Pt is divorced   2 children   Right handed   Drinks soda, no coffee, no tea    Lives on the third floor of an apartment bldg. One story studio apartment   Social Determinants of Health   Financial Resource Strain: Not on file  Food Insecurity: Not on file  Transportation Needs: Not on file  Physical Activity: Not on file  Stress: Not on file  Social Connections: Not on file  Intimate Partner Violence: Not on file    Vitals BP (!) 106/59 (BP Location: Left Arm)   Pulse 65   Temp 98 F (36.7 C) (Axillary)   Resp 16   Ht 6' (1.829 m)   Wt 85.2 kg   SpO2 98%   BMI 25.47 kg/m    Physical Exam Constitutional:   Not in acute distress, lying in bed    Comments:   Cardiovascular:     Rate and Rhythm: Normal rate and regular rhythm.     Heart sounds: No murmur heard.  Right chest wall has a wound VAC-very minimal surrounding erythema and mild tenderness  Pulmonary:     Effort: Pulmonary effort is normal.  Comments: Clear breath sounds  Abdominal:     Palpations: Abdomen is soft.     Tenderness: Nontender  Musculoskeletal:        General: No swelling or tenderness.   Skin:    Comments: No obvious rashes  Neurological:     General: No focal deficit present.   Psychiatric:        Mood and Affect: Mood normal.     Pertinent Microbiology Results for orders placed or performed during the hospital encounter of 03/13/21  Urine culture     Status: None   Collection Time: 03/13/21 12:25 AM   Specimen: In/Out Cath Urine  Result Value Ref Range Status   Specimen Description IN/OUT CATH URINE  Final   Special Requests NONE  Final   Culture   Final    NO GROWTH Performed at [facility], [place] [address], [place], [place] [address]    Report Status 03/15/2021 FINAL  Final  Blood Culture (routine x 2)     Status: None   Collection Time: 03/13/21 12:25 PM   Specimen: Right Antecubital; Blood  Result Value Ref Range Status   Specimen Description   Final    RIGHT ANTECUBITAL BLOOD Performed at [facility], [address] [place], [place] [address]    Special Requests   Final    BOTTLES DRAWN AEROBIC AND ANAEROBIC Blood Culture results may not be optimal due to an inadequate volume of blood received in culture bottles Performed at [facility], [address] [place], [place] [address]    Culture   Final    NO GROWTH 5 DAYS Performed at [facility], [place] [address] [place], [place] [address]    Report Status 03/18/2021 FINAL  Final  Blood Culture (routine x 2)     Status: None   Collection Time: 03/13/21  1:15 PM   Specimen: BLOOD RIGHT HAND  Result Value Ref Range Status   Specimen Description   Final    BLOOD RIGHT HAND BLOOD Performed at [facility], [address] [place], [place] [address]    Special Requests   Final    BOTTLES DRAWN AEROBIC AND ANAEROBIC Blood Culture results may not be optimal due to an inadequate volume  of blood received in culture bottles Performed at [facility], [address] [place], [place] [address]    Culture   Final    NO GROWTH 5 DAYS Performed at [facility], [place] [address] [place],  [address]    Report Status 03/18/2021 FINAL  Final  Resp Panel by RT-PCR (Flu A&B, Covid) Nasopharyngeal Swab     Status: None   Collection Time: 03/13/21  2:01 PM   Specimen: Nasopharyngeal Swab; Nasopharyngeal(NP) swabs in vial transport medium  Result Value Ref Range Status   SARS Coronavirus 2 by RT PCR NEGATIVE NEGATIVE Final    Comment: (NOTE) SARS-CoV-2 target nucleic acids are NOT DETECTED.  The SARS-CoV-2 RNA is generally detectable in upper respiratory specimens during the acute phase of infection. The lowest concentration of SARS-CoV-2 viral copies this assay can detect is 138 copies/mL. A negative result does not preclude SARS-Cov-2 infection and should not be used as the sole basis for treatment or other patient management decisions. A negative result may occur with  improper specimen collection/handling, submission of specimen other than nasopharyngeal swab, presence of viral mutation(s) within the areas targeted by this assay, and  inadequate number of viral copies(<138 copies/mL). A negative result must be combined with clinical observations, patient history, and epidemiological information. The expected result is Negative.  Fact Sheet for Patients:  EntrepreneurPulse.com.au  Fact Sheet for Healthcare Providers:  IncredibleEmployment.be  This test is no t yet approved or cleared by the [place] FDA and  has been authorized for detection and/or diagnosis of SARS-CoV-2 by FDA under an Emergency Use Authorization (EUA). This EUA will remain  in effect (meaning this test can be used) for the duration of the COVID-19 declaration under Section 564(b)(1) of the Act, 21 U.S.C.section 360bbb-3(b)(1), unless the  authorization is terminated  or revoked sooner.       Influenza A by PCR NEGATIVE NEGATIVE Final   Influenza B by PCR NEGATIVE NEGATIVE Final    Comment: (NOTE) The Xpert Xpress SARS-CoV-2/FLU/RSV plus assay is intended as an aid in the diagnosis of influenza from Nasopharyngeal swab specimens and should not be used as a sole basis for treatment. Nasal washings and aspirates are unacceptable for Xpert Xpress SARS-CoV-2/FLU/RSV testing.  Fact Sheet for Patients: EntrepreneurPulse.com.au  Fact Sheet for Healthcare Providers: IncredibleEmployment.be  This test is not yet approved or cleared by the [place] FDA and has been authorized for detection and/or diagnosis of SARS-CoV-2 by FDA under an Emergency Use Authorization (EUA). This EUA will remain in effect (meaning this test can be used) for the duration of the COVID-19 declaration under Section 564(b)(1) of the Act, 21 U.S.C. section 360bbb-3(b)(1), unless the authorization is terminated or revoked.  Performed at [facility], [address] [place], [place] [address]   MRSA PCR Screening     Status: Abnormal   Collection Time: 03/13/21 11:51 PM   Specimen: Nasal Mucosa; Nasopharyngeal  Result Value Ref Range Status   MRSA by PCR POSITIVE (A) NEGATIVE Final    Comment:        The GeneXpert MRSA Assay (FDA approved for NASAL specimens only), is one component of a comprehensive MRSA colonization surveillance program. It is not intended to diagnose MRSA infection nor to guide or monitor treatment for MRSA infections. RESULT CALLED TO, READ BACK BY AND VERIFIED WITHDorna Bloom RN 03/14/21 0432 JDW Performed at [place] [address] [place], [place] [address]   Aerobic/Anaerobic Culture (surgical/deep wound)     Status: None   Collection Time: 03/14/21  1:23 PM   Specimen: Abscess  Result Value Ref Range Status   Specimen Description ABSCESS  Final    Special Requests NONE  Final   Gram Stain   Final    ABUNDANT WBC PRESENT, PREDOMINANTLY PMN ABUNDANT GRAM POSITIVE COCCI IN CLUSTERS    Culture   Final    ABUNDANT METHICILLIN RESISTANT STAPHYLOCOCCUS AUREUS NO ANAEROBES ISOLATED Performed at [facility], [address], [place], [place] [address]    Report Status 03/19/2021 FINAL  Final   Organism ID, Bacteria METHICILLIN RESISTANT STAPHYLOCOCCUS AUREUS  Final      Susceptibility   Methicillin resistant staphylococcus aureus - MIC*    CIPROFLOXACIN >=8 RESISTANT Resistant     ERYTHROMYCIN >=8 RESISTANT Resistant     GENTAMICIN <=0.5 SENSITIVE Sensitive     OXACILLIN >=4 RESISTANT Resistant     TETRACYCLINE <=1 SENSITIVE Sensitive     VANCOMYCIN <=0.5 SENSITIVE Sensitive     TRIMETH/SULFA >=320 RESISTANT Resistant     CLINDAMYCIN >=8 RESISTANT Resistant     RIFAMPIN <=0.5 SENSITIVE Sensitive     Inducible  Clindamycin NEGATIVE Sensitive     * ABUNDANT METHICILLIN RESISTANT STAPHYLOCOCCUS AUREUS  Aerobic/Anaerobic Culture w Gram Stain (surgical/deep wound)     Status: None (Preliminary result)   Collection Time: 03/18/21  1:38 PM   Specimen: Abscess  Result Value Ref Range Status   Specimen Description ABSCESS  Final   Special Requests DRAIN CHEST WALL  Final   Gram Stain   Final    ABUNDANT WBC PRESENT, PREDOMINANTLY PMN ABUNDANT GRAM POSITIVE COCCI IN CLUSTERS Performed at [facility], [place] [address] [place], [place] [address]    Culture   Final    ABUNDANT METHICILLIN RESISTANT STAPHYLOCOCCUS AUREUS NO ANAEROBES ISOLATED; CULTURE IN PROGRESS FOR 5 DAYS    Report Status PENDING  Incomplete   Organism ID, Bacteria METHICILLIN RESISTANT STAPHYLOCOCCUS AUREUS  Final      Susceptibility   Methicillin resistant staphylococcus aureus - MIC*    CIPROFLOXACIN >=8 RESISTANT Resistant     ERYTHROMYCIN >=8 RESISTANT Resistant     GENTAMICIN <=0.5 SENSITIVE Sensitive     OXACILLIN >=4 RESISTANT Resistant      TETRACYCLINE <=1 SENSITIVE Sensitive     VANCOMYCIN <=0.5 SENSITIVE Sensitive     TRIMETH/SULFA >=320 RESISTANT Resistant     CLINDAMYCIN >=8 RESISTANT Resistant     RIFAMPIN <=0.5 SENSITIVE Sensitive     Inducible Clindamycin NEGATIVE Sensitive     * ABUNDANT METHICILLIN RESISTANT STAPHYLOCOCCUS AUREUS  Fungus Culture With Stain     Status: None (Preliminary result)   Collection Time: 03/19/21  3:07 PM   Specimen: Wound; Abscess  Result Value Ref Range Status   Fungus Stain Final report  Final    Comment: (NOTE) Performed At: [facility] [place], [place] [id number] Rush Farmer MD UY:[id number]    Fungus (Mycology) Culture PENDING  Incomplete   Fungal Source ABSCESS  Final    Comment: RIGHT CHEST Performed at [facility], [place] [address], [place], [place] [address]   Aerobic/Anaerobic Culture w Gram Stain (surgical/deep wound)     Status: None (Preliminary result)   Collection Time: 03/19/21  3:07 PM   Specimen: Wound; Abscess  Result Value Ref Range Status   Specimen Description ABSCESS RIGHT CHEST  Final   Special Requests NONE  Final   Gram Stain   Final    FEW WBC PRESENT, PREDOMINANTLY PMN FEW GRAM POSITIVE COCCI IN PAIRS IN CLUSTERS Performed at [facility], [address] [place], [place] [address]    Culture   Final    FEW METHICILLIN RESISTANT STAPHYLOCOCCUS AUREUS NO ANAEROBES ISOLATED; CULTURE IN PROGRESS FOR 5 DAYS    Report Status PENDING  Incomplete   Organism ID, Bacteria METHICILLIN RESISTANT STAPHYLOCOCCUS AUREUS  Final      Susceptibility   Methicillin resistant staphylococcus aureus - MIC*    CIPROFLOXACIN >=8 RESISTANT Resistant     ERYTHROMYCIN >=8 RESISTANT Resistant     GENTAMICIN <=0.5 SENSITIVE Sensitive     OXACILLIN >=4 RESISTANT Resistant     TETRACYCLINE <=1 SENSITIVE Sensitive     VANCOMYCIN 1 SENSITIVE Sensitive     TRIMETH/SULFA >=320 RESISTANT Resistant     CLINDAMYCIN >=8 RESISTANT Resistant      RIFAMPIN <=0.5 SENSITIVE Sensitive     Inducible Clindamycin NEGATIVE Sensitive     * FEW METHICILLIN RESISTANT STAPHYLOCOCCUS AUREUS  Fungus Culture Result     Status: None   Collection Time: 03/19/21  3:07 PM  Result Value Ref Range Status  Result 1 Comment  Final    Comment: (NOTE) KOH/Calcofluor preparation:  no fungus observed. Performed At: [facility] [place], [place] [id number] Rush Farmer MD FM:[id number]       Pertinent Lab seen by me: CBC Latest Ref Rng & Units 03/23/2021 03/21/2021 03/20/2021  WBC 4.0 - 10.5 K/uL 5.7 8.8 7.8  Hemoglobin 13.0 - 17.0 g/dL 10.3(L) 10.1(L) 11.2(L)  Hematocrit 39.0 - 52.0 % 30.9(L) 29.8(L) 33.6(L)  Platelets 150 - 400 K/uL 314 289 234   CMP Latest Ref Rng & Units 03/23/2021 03/21/2021 03/20/2021  Glucose 70 - 99 mg/dL 127(H) 111(H) 266(H)  BUN 8 - 23 mg/dL 23 23 16   Creatinine 0.61 - 1.24 mg/dL 1.20 1.21 1.27(H)  Sodium 135 - 145 mmol/L 134(L) 132(L) 132(L)  Potassium 3.5 - 5.1 mmol/L 4.5 4.3 5.2(H)  Chloride 98 - 111 mmol/L 97(L) 97(L) 97(L)  CO2 22 - 32 mmol/L 30 27 25   Calcium 8.9 - 10.3 mg/dL 8.7(L) 8.3(L) 8.3(L)  Total Protein 6.5 - 8.1 g/dL 6.2(L) 6.1(L) 6.3(L)  Total Bilirubin 0.3 - 1.2 mg/dL 0.6 0.6 0.7  Alkaline Phos 38 - 126 U/L 80 81 87  AST 15 - 41 U/L 15 14(L) 13(L)  ALT 0 - 44 U/L 9 9 11      Pertinent Imagings/Other Imagings Plain films and CT images have been personally visualized and interpreted; radiology reports have been reviewed. Decision making incorporated into the Impression / Recommendations.  Ct chest 03/22/21 IMPRESSION: 1. No CT evidence of pulmonary embolism. 2. Small bilateral pleural effusions with partial compressive atelectasis of the lower lobes. Pneumonia is not excluded. Clinical correlation is recommended. 3. Large skin wound in the lower right anterior chest wall with a wound VAC. 4. Destructive changes of the anterior right sixth rib concerning for osteomyelitis.  Clinical correlation is recommended. No drainable fluid collection or abscess. 5. Aortic Atherosclerosis (ICD10-I70.0).  CT chest 03/17/21   IMPRESSION: 1. Interval decrease in size of bilobed right anterolateral chest wall fluid collection. The inferior lobulation is nearly completely resolved with some fat stranding and subcutaneous emphysema remaining. The more superior lobulation has decreased in size since prior study from 03/13/2021. The overall size of the collections now measures 7.4 x 7.1 x 3.7 cm compared to 10.1 x 9.1 x 5.7 cm on 03/13/2021. 2. Lucency in the anterior segments of the right sixth and seventh ribs again seen. Findings are suspicious for osteomyelitis, however underlying pathologic fracture is also possible.  CT Chest 03/13/21 IMPRESSION: Increasing size of the right chest wall collections, primarily centered around the right sixth and eighth anterolateral ribs, with development of air bubbles. Those ribs show a pattern of irregular destruction that have progressed and look more like pathologic fractures due to infection rather than traumatic fractures. Air bubbles are now present within the collections and they probably represent abscesses. There is mild extrapleural involvement on the inner side of the ribcage, without frank breakthrough into the pleural space. The majority of the collections are on the outer side of the ribcage. No new areas of involvement are seen.  Case discussed with Dr. Regenia Skeeter at 1419 hours.  Aortic Atherosclerosis (ICD10-I70.0).  03/09/21 CT Chest  IMPRESSION: 1. Nondisplaced acute anterior right sixth, seventh and eighth rib fractures with surrounding chest wall hematoma. No pneumothorax or hemothorax. No discrete bone lesions. Clinical follow-up advised to ensure resolution of the right chest wall hematoma. Any need for follow-up imaging should be based on clinical assessment. 2. Mild patchy ground-glass opacity in the  peripheral posterior right mid lung, nonspecific, favor mild pulmonary contusion. 3. Mild platelike atelectasis at the left lung base. 4. Three-vessel coronary atherosclerosis. 5. Aortic Atherosclerosis (ICD10-I70.0).  I have spent 60 minutes for this patient encounter including review of prior medical records with greater than 50% of time being face to face and coordination of their care.  Electronically signed by:   Rosiland Oz, MD Infectious Disease Physician [facility] for Infectious Disease Pager: [phone]
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
## 2021-03-24 DIAGNOSIS — N179 Acute kidney failure, unspecified: Secondary | ICD-10-CM | POA: Diagnosis not present

## 2021-03-24 DIAGNOSIS — A419 Sepsis, unspecified organism: Secondary | ICD-10-CM | POA: Diagnosis not present

## 2021-03-24 DIAGNOSIS — R6520 Severe sepsis without septic shock: Secondary | ICD-10-CM | POA: Diagnosis not present

## 2021-03-24 LAB — AEROBIC/ANAEROBIC CULTURE W GRAM STAIN (SURGICAL/DEEP WOUND)

## 2021-03-24 LAB — COMPREHENSIVE METABOLIC PANEL
ALT: 10 U/L (ref 0–44)
AST: 15 U/L (ref 15–41)
Albumin: 2.4 g/dL — ABNORMAL LOW (ref 3.5–5.0)
Alkaline Phosphatase: 88 U/L (ref 38–126)
Anion gap: 5 (ref 5–15)
BUN: 28 mg/dL — ABNORMAL HIGH (ref 8–23)
CO2: 34 mmol/L — ABNORMAL HIGH (ref 22–32)
Calcium: 9.1 mg/dL (ref 8.9–10.3)
Chloride: 97 mmol/L — ABNORMAL LOW (ref 98–111)
Creatinine, Ser: 1.29 mg/dL — ABNORMAL HIGH (ref 0.61–1.24)
GFR, Estimated: >60 mL/min (ref >60)
Glucose, Bld: 130 mg/dL — ABNORMAL HIGH (ref 70–99)
Potassium: 4.8 mmol/L (ref 3.5–5.1)
Sodium: 136 mmol/L (ref 135–145)
Total Bilirubin: 0.6 mg/dL (ref 0.3–1.2)
Total Protein: 6.9 g/dL (ref 6.5–8.1)

## 2021-03-24 LAB — CBC WITH DIFFERENTIAL/PLATELET
Abs Immature Granulocytes: 0.04 10*3/uL (ref 0.00–0.07)
Basophils Absolute: 0.1 10*3/uL (ref 0.0–0.1)
Basophils Relative: 1 %
Eosinophils Absolute: 0.3 10*3/uL (ref 0.0–0.5)
Eosinophils Relative: 3 %
HCT: 31.2 % — ABNORMAL LOW (ref 39.0–52.0)
Hemoglobin: 10.2 g/dL — ABNORMAL LOW (ref 13.0–17.0)
Immature Granulocytes: 1 %
Lymphocytes Relative: 22 %
Lymphs Abs: 1.7 10*3/uL (ref 0.7–4.0)
MCH: 32.7 pg (ref 26.0–34.0)
MCHC: 32.7 g/dL (ref 30.0–36.0)
MCV: 100 fL (ref 80.0–100.0)
Monocytes Absolute: 0.7 10*3/uL (ref 0.1–1.0)
Monocytes Relative: 9 %
Neutro Abs: 5.2 10*3/uL (ref 1.7–7.7)
Neutrophils Relative %: 64 %
Platelets: 391 10*3/uL (ref 150–400)
RBC: 3.12 MIL/uL — ABNORMAL LOW (ref 4.22–5.81)
RDW: 13.3 % (ref 11.5–15.5)
WBC: 8 10*3/uL (ref 4.0–10.5)
nRBC: 0 % (ref 0.0–0.2)

## 2021-03-24 LAB — GLUCOSE, CAPILLARY
Glucose-Capillary: 124 mg/dL — ABNORMAL HIGH (ref 70–99)
Glucose-Capillary: 147 mg/dL — ABNORMAL HIGH (ref 70–99)
Glucose-Capillary: 137 mg/dL — ABNORMAL HIGH (ref 70–99)
Glucose-Capillary: 172 mg/dL — ABNORMAL HIGH (ref 70–99)
Glucose-Capillary: 117 mg/dL — ABNORMAL HIGH (ref 70–99)

## 2021-03-24 LAB — CK: Total CK: 16 U/L — ABNORMAL LOW (ref 49–397)

## 2021-03-24 LAB — C-REACTIVE PROTEIN: CRP: 2.6 mg/dL — ABNORMAL HIGH (ref <1.0)

## 2021-03-24 LAB — MAGNESIUM: Magnesium: 2 mg/dL (ref 1.7–2.4)

## 2021-03-24 LAB — BRAIN NATRIURETIC PEPTIDE: B Natriuretic Peptide: 37.7 pg/mL (ref 0.0–100.0)

## 2021-03-24 MED ORDER — LACTATED RINGERS IV SOLN: INTRAVENOUS | Status: AC

## 2021-03-24 NOTE — Progress Notes (Deleted)
Dear Doctor: This patient has been identified as a candidate for PICC/CVC for the following reason (s): multiple PIV restarts, multiple incompatible infusions. If you agree, please write an order for the indicated device.   Thank you for supporting the early vascular access assessment program.

## 2021-03-24 NOTE — Progress Notes (Signed)
     [address]       [place] [address]             [phone]       No events  Vitals:   03/23/21 2045 03/24/21 0500  BP: 129/69 123/76  Pulse: 81 70  Resp: 18 17  Temp: 98.6 F (37 C) 98.4 F (36.9 C)  SpO2: 98% 99%    OR tomorrow for wound vac change.  Caprice Wasko Bary Leriche

## 2021-03-24 NOTE — TOC Initial Note (Signed)
Transition of Care [facility]) - Initial/Assessment Note    Patient Details  Name: Brian Malone MRN: [id number] Date of Birth: December 21, 1950  Transition of Care [facility] CM/SW Contact:    Carles Collet, RN Phone Number: 03/24/2021, 3:17 PM  Clinical Narrative:      Damaris Schooner w patient at bedside.  He states that he lives at home with a roommate who works during the day. The roommare is retired, however Mr. Mitzel states that he is usually home by himself during the day. He will be getting the wound VAC to his chest changed in the OR tomorrow, and he shared with me that he is concerned about his ribs and the potential for osteomyelitis. We discussed different possibilities for discharge but agreed to discuss it further after surgery tomorrow. He shares that he uses [facility] transport to get to his outpatient PT appointments.  Disposition- to be determined, will further evaluate after surgery tomorrow. Potential for home wound VAC, IF discharged home he would be better suited for home PT as well as Therapist, sports.             Expected Discharge Plan:  (TBD) Barriers to Discharge: Continued Medical Work up   Patient Goals and CMS Choice        Expected Discharge Plan and Services Expected Discharge Plan:  (TBD)   Discharge Planning Services: CM Consult   Living arrangements for the past 2 months: Single Family Home                                      Prior Living Arrangements/Services Living arrangements for the past 2 months: Single Family Home Lives with:: Roommate                   Activities of Daily Living Home Assistive Devices/Equipment: None ADL Screening (condition at time of admission) Patient's cognitive ability adequate to safely complete daily activities?: Yes Is the patient deaf or have difficulty hearing?: No Does the patient have difficulty seeing, even when wearing glasses/contacts?: No Does the patient have difficulty concentrating, remembering, or making  decisions?: No Patient able to express need for assistance with ADLs?: Yes Does the patient have difficulty dressing or bathing?: No Independently performs ADLs?: Yes (appropriate for developmental age) Does the patient have difficulty walking or climbing stairs?: No Weakness of Legs: Both Weakness of Arms/Hands: None  Permission Sought/Granted                  Emotional Assessment              Admission diagnosis:  Abscess of chest ([place]) [J86.9] Acute kidney injury ([place]) [N17.9] Septic shock ([place]) [A41.9, R65.21] Sepsis ([place]) [A41.9] Patient Active Problem List   Diagnosis Date Noted  . Osteomyelitis ([place])   . Protein-calorie malnutrition, severe 03/19/2021  . Sepsis ([place]) 03/13/2021  . Abscess of chest wall 03/13/2021  . Mixed diabetic hyperlipidemia associated with type 2 diabetes mellitus ([place]) 03/13/2021  . GERD without esophagitis 03/13/2021  . BPH (benign prostatic hyperplasia) 03/13/2021  . Chronic hepatitis C without hepatic coma ([place]) 03/13/2021  . Malnutrition of moderate degree 11/05/2020  . Weakness of right upper extremity 11/04/2020  . Abscess 11/04/2020  . CAD (coronary artery disease) 05/26/2020  . Dysphagia 06/20/2019  . AKI (acute kidney injury) ([place]) 06/20/2019  . HCAP (healthcare-associated pneumonia) 06/20/2019  . Lactic acidosis 06/20/2019  . Cervical spinal stenosis 05/18/2019  .  Lumbar spondylosis 05/18/2019  . Sacroiliitis ([place]) 05/15/2019  . HIV (human immunodeficiency virus infection) ([place]) 05/13/2019  . Wound infection after surgery 04/15/2019  . Benign prostatic hyperplasia (BPH) with straining on urination 02/22/2019  . Essential hypertension 02/22/2019  . Post-traumatic osteoarthritis of both ankles 02/22/2019  . Trigeminal neuralgia 02/22/2019  . Uncontrolled type 2 diabetes mellitus with hyperglycemia ([place]) 02/22/2019  . Chronic bilateral low back pain with bilateral sciatica 01/19/2019  . Diabetic autonomic neuropathy  associated with type 2 diabetes mellitus ([place]) 01/19/2019  . Migraine with aura and without status migrainosus, not intractable 01/19/2019  . Neck pain 01/19/2019  . Neuropathy of both feet 01/19/2019  . Nocturia more than twice per night 01/07/2015  . Anxiety disorder 10/23/2014  . Depression 10/23/2014  . Insomnia 10/23/2014  . Rash of entire body 10/23/2014   PCP:  Doreatha Lew, MD Pharmacy:   [facility] [phone] - [place], [address] AT [facility] OF [address] [place] [address] Phone: [phone] Fax: [place], [place] - [address] [place] [address] Phone: [phone] Fax: [id number] - [place], [place] - [address] AT [place] [address] [place] [address] Phone: [phone] Fax: [phone]  [place], [place] [place] [place] [address] Phone: [phone] Fax: [place], [place] [address] Phone: [phone] Fax: [phone]     Social Determinants of Health (SDOH) Interventions    Readmission Risk Interventions No flowsheet data found.

## 2021-03-24 NOTE — Progress Notes (Addendum)
PROGRESS NOTE                                                                                                                                                                                                             Patient Demographics:    Brian Malone, is a 70 y.o. male, DOB - [birth year], OAC:[id number]  Outpatient Primary MD for the patient is [name], [name], MD    LOS - 11  Admit date - 03/13/2021    Chief Complaint  Patient presents with  . Rib Injury       Brief Narrative (HPI from H&P)  70 year old male with past medical history of hepatitis C, hypertension, hyperlipidemia, diabetes mellitus type 2, chronic pain syndrome, intravenous drug use (abstinent since 2019), major depressive disorder and HIV who had a trip and fall in his yard on 02/22/2021 after which he hurt his right-sided chest wall, at that time he was diagnosed with right-sided rib cage fracture and injury, he was seen in the ER a few times since then and was treated conservatively, his pain and discomfort continued to get worse and he presented again to [facility] on  03/13/2021 CT scan suggested that he had right chest wall abscess formation and cellulitis with concerns for sepsis.   Subjective:   Patient in bed resting comfortably, denies any headache or abdominal pain, no shortness of breath, does have some right chest wall discomfort   Assessment  & Plan :     1. Sepsis due to right-sided chest wall abscess and possible right rib cage osteomyelitis formation after mechanical fall and multiple right rib fractures sustained on 02/22/2021 - he is on Daptomycin since 03/23/2021 previously was on Vancomycin, initially underwent drain placement by IR to the right chest wall abscess with minimal improvement in taken to the OR by cardiothoracic surgery on 03/19/2021 with open incision and drainage and wound VAC  placement.  Now abscess considerably improved.  Abscess fluid growing MRSA and there is question of osteomyelitis.  ID consulted and following.  Cardiothoracic surgery planning take him to the OR for wound VAC change either today or tomorrow.  Encouraged the patient to sit up in chair in the daytime use I-S and flutter valve for pulmonary toiletry.  Will advance activity and titrate down oxygen as possible.  Not a good candidate  for PICC line and home IV antibiotics history of IV drug use in the past.   2.  Chronic pain and narcotic use.  Supportive care as needed for acute discomfort, avoid overuse, as needed Narcan added.  Patient counseled see above.  He does exhibit narcotic seeking behavior, has been warned multiple times as this can lead to accidental overdose and even death friend in the room on 04-02-2021 also made aware of the concerns. Kindly see nursing documentation as well.   3. HIV - Continue home regimen follows with [facility] ID department.  4.  Anxiety and depression.  Home medications continued.  Currently not homicidal or suicidal.  5.  GERD.  On PPI.  6.  BPH.  On Flomax.  7.  AKI.  Due to ATN from sepsis, resolved after hydration, gentle hydration again on 03/24/2021.  8.  Hypertension.  Blood pressure soft, skipped today's blood pressure medications will monitor closely.   9.  DM type II.  On sliding scale monitor and adjust  Lab Results  Component Value Date   HGBA1C 8.3 (H) 03/14/2021   CBG (last 3)  Recent Labs    03/23/21 2050 03/24/21 0612 03/24/21 0750  GLUCAP 160* 124* 147*          Condition - Extremely Guarded  Family Communication  : None present bedside  Code Status :  Full  Consults  :  IR, cardiothoracic surgery, ID  PUD Prophylaxis : PPI   Procedures  :     Right chest wall I&D by Dr. Kipp Brood 03/19/21  US guided chest wall abscess drined by IR 03/14/21   CT -  Increasing size of the right chest wall collections, primarily  centered around the right sixth and eighth anterolateral ribs, with development of air bubbles. Those ribs show a pattern of irregular destruction that have progressed and look more like pathologic fractures due to infection rather than traumatic fractures. Air bubbles are now present within the collections and they probably represent abscesses. There is mild extrapleural involvement on the inner side of the ribcage, without frank breakthrough into the pleural space. The majority of the collections are on the outer side of the ribcage. No new areas of involvement are seen.  CT repeat 03/17/21 - 1. Interval decrease in size of bilobed right anterolateral chest wall fluid collection. The inferior lobulation is nearly completely resolved with some fat stranding and subcutaneous emphysema remaining. The more superior lobulation has decreased in size since prior study from 03/13/2021. The overall size of the collections now measures 7.4 x 7.1 x 3.7 cm compared to 10.1 x 9.1 x 5.7 cm on 03/13/2021. 2. Lucency in the anterior segments of the right sixth and seventh ribs again seen. Findings are suspicious for osteomyelitis, however underlying pathologic fracture is also possible.      Disposition Plan  :    Status is: Inpatient  Remains inpatient appropriate because:IV treatments appropriate due to intensity of illness or inability to take PO   Dispo: The patient is from: Home              Anticipated d/c is to: Home              Patient currently is not medically stable to d/c.   Difficult to place patient No  DVT Prophylaxis  :   Heparin   Lab Results  Component Value Date   PLT 391 03/24/2021    Diet :  Diet Order  Diet NPO time specified  Diet effective now                  Inpatient Medications  Scheduled Meds: . (feeding supplement) PROSource Plus  30 mL Oral TID WC  . abacavir-dolutegravir-lamiVUDine  1 tablet Oral Daily  . busPIRone  30 mg Oral BID  . clonazepam   0.5 mg Oral TID  . ezetimibe  10 mg Oral Daily  . feeding supplement  237 mL Oral BID BM  . feeding supplement (GLUCERNA SHAKE)  237 mL Oral Q24H  . Glecaprevir-Pibrentasvir  3 tablet Oral Daily  . heparin injection (subcutaneous)  5,000 Units Subcutaneous Q8H  . insulin aspart  0-15 Units Subcutaneous TID AC & HS  . metoprolol tartrate  25 mg Oral BID  . multivitamin with minerals  1 tablet Oral Daily  . pantoprazole  40 mg Oral QAC breakfast  . sodium chloride flush  5 mL Intracatheter Q8H  . tamsulosin  0.4 mg Oral QHS  . tiZANidine  2 mg Oral QHS   Continuous Infusions: . sodium chloride Stopped (03/13/21 2106)  . DAPTOmycin (CUBICIN)  IV 700 mg (03/23/21 2013)  . lactated ringers     PRN Meds:.sodium chloride, acetaminophen **OR** [DISCONTINUED] acetaminophen, antiseptic oral rinse, HYDROcodone-acetaminophen, HYDROmorphone (DILAUDID) injection **OR** [DISCONTINUED]  HYDROmorphone (DILAUDID) injection, naLOXone (NARCAN)  injection, [DISCONTINUED] ondansetron **OR** ondansetron (ZOFRAN) IV, polyethylene glycol, valACYclovir  Antibiotics  :    Anti-infectives (From admission, onward)   Start     Dose/Rate Route Frequency Ordered Stop   03/23/21 2000  DAPTOmycin (CUBICIN) 700 mg in sodium chloride 0.9 % IVPB        8 mg/kg  85.2 kg 128 mL/hr over 30 Minutes Intravenous Every 24 hours 03/23/21 1133     03/22/21 1200  vancomycin (VANCOREADY) IVPB 1250 mg/250 mL  Status:  Discontinued        1,250 mg 166.7 mL/hr over 90 Minutes Intravenous Every 24 hours 03/21/21 1319 03/23/21 1133   03/20/21 1000  Glecaprevir-Pibrentasivir 100-40 mg (Mavyret) tabs 3 tablets - patient's own supply        3 tablet Oral Daily 03/20/21 0733     03/15/21 1200  vancomycin (VANCOREADY) IVPB 1500 mg/300 mL  Status:  Discontinued        1,500 mg 150 mL/hr over 120 Minutes Intravenous Every 24 hours 03/15/21 0732 03/21/21 1319   03/14/21 1200  vancomycin (VANCOREADY) IVPB 1250 mg/250 mL  Status:   Discontinued        1,250 mg 166.7 mL/hr over 90 Minutes Intravenous Every 24 hours 03/13/21 1359 03/15/21 0732   03/14/21 1023  valACYclovir (VALTREX) tablet 1,000 mg       Note to Pharmacy: Trig neuralgia flare     1,000 mg Oral 3 times daily PRN 03/14/21 1023     03/14/21 1000  abacavir-dolutegravir-lamiVUDine (TRIUMEQ) 600-50-300 MG per tablet 1 tablet        1 tablet Oral Daily 03/13/21 2320     03/14/21 0200  meropenem (MERREM) 1 g in sodium chloride 0.9 % 100 mL IVPB  Status:  Discontinued        1 g 200 mL/hr over 30 Minutes Intravenous Every 8 hours 03/13/21 2337 03/16/21 0719   03/14/21 0100  ceFEPIme (MAXIPIME) 2 g in sodium chloride 0.9 % 100 mL IVPB  Status:  Discontinued       Note to Pharmacy: Cefepime 2 g IV q12h for CrCl < 60 mL/min   2 g 200 mL/hr  over 30 Minutes Intravenous Every 12 hours 03/13/21 2326 03/13/21 2331   03/13/21 2000  piperacillin-tazobactam (ZOSYN) IVPB 3.375 g  Status:  Discontinued        3.375 g 12.5 mL/hr over 240 Minutes Intravenous Every 8 hours 03/13/21 1402 03/13/21 2320   03/13/21 1430  clindamycin (CLEOCIN) IVPB 900 mg        900 mg 100 mL/hr over 30 Minutes Intravenous  Once 03/13/21 1418 03/13/21 1548   03/13/21 1230  vancomycin (VANCOCIN) IVPB 1000 mg/200 mL premix        1,000 mg 200 mL/hr over 60 Minutes Intravenous  Once 03/13/21 1229 03/13/21 1506   03/13/21 1230  piperacillin-tazobactam (ZOSYN) IVPB 3.375 g        3.375 g 100 mL/hr over 30 Minutes Intravenous  Once 03/13/21 1229 03/13/21 1400       Time Spent in minutes  30   Lala Lund M.D on 03/24/2021 at 9:32 AM  To page go to [email or web address]   [facility] -  Office  [phone]    See all Orders from today for further details    Objective:   Vitals:   03/23/21 0500 03/23/21 1539 03/23/21 2045 03/24/21 0500  BP: (!) 106/59 (!) 111/57 129/69 123/76  Pulse: 65 80 81 70  Resp: 16 18 18 17   Temp: 98 F (36.7 C) 98.4 F (36.9 C) 98.6 F (37 C) 98.4 F  (36.9 C)  TempSrc: Axillary Oral Oral Oral  SpO2: 98% 100% 98% 99%  Weight:      Height:        Wt Readings from Last 3 Encounters:  03/19/21 85.2 kg  03/09/21 77.1 kg  01/22/21 72.6 kg     Intake/Output Summary (Last 24 hours) at 03/24/2021 0932 Last data filed at 03/23/2021 2017 Gross per 24 hour  Intake 120 ml  Output 1000 ml  Net -880 ml   Physical Exam  Awake Alert, appears comfortable, no new F.N deficits,  [place].AT,PERRAL Supple Neck,No JVD, No cervical lymphadenopathy appriciated.  Symmetrical Chest wall movement, Good air movement bilaterally, CTAB, right chest wall wound VAC in place, RRR,No Gallops, Rubs or new Murmurs, No Parasternal Heave +ve B.Sounds, Abd Soft, No tenderness, No organomegaly appriciated, No rebound - guarding or rigidity. No Cyanosis, Clubbing or edema, No new Rash or bruise    Data Review:    CBC Recent Labs  Lab 03/19/21 0103 03/20/21 0036 03/21/21 0252 03/23/21 0203 03/24/21 0542  WBC 9.4 7.8 8.8 5.7 8.0  HGB 11.4* 11.2* 10.1* 10.3* 10.2*  HCT 33.5* 33.6* 29.8* 30.9* 31.2*  PLT 214 234 289 314 391  MCV 95.7 95.7 96.4 98.1 100.0  MCH 32.6 31.9 32.7 32.7 32.7  MCHC 34.0 33.3 33.9 33.3 32.7  RDW 12.8 12.8 12.9 13.2 13.3  LYMPHSABS 1.1 0.4* 1.7 1.5 1.7  MONOABS 1.1* 0.3 0.8 0.6 0.7  EOSABS 0.1 0.0 0.1 0.1 0.3  BASOSABS 0.0 0.0 0.0 0.0 0.1    Recent Labs  Lab 03/18/21 0151 03/19/21 0103 03/20/21 0036 03/21/21 0252 03/23/21 0203 03/24/21 0542  NA 133* 131* 132* 132* 134* 136  K 3.8 4.4 5.2* 4.3 4.5 4.8  CL 100 97* 97* 97* 97* 97*  CO2 26 29 25 27 30  34*  GLUCOSE 104* 124* 266* 111* 127* 130*  BUN 10 8 16 23 23  28*  CREATININE 1.10 1.13 1.27* 1.21 1.20 1.29*  CALCIUM 8.1* 8.2* 8.3* 8.3* 8.7* 9.1  AST 13* 13* 13* 14* 15 15  ALT 8 8  11 9 9 10   ALKPHOS 82 90 87 81 80 88  BILITOT 0.6 0.9 0.7 0.6 0.6 0.6  ALBUMIN 1.7* 1.8* 1.9* 2.1* 2.3* 2.4*  MG 1.7 1.7 1.7 2.0 2.0 2.0  CRP  --   --   --   --  2.7* 2.6*  PROCALCITON 0.26  0.27 0.12 0.10  --   --   BNP  --   --   --   --  84.1 37.7    ------------------------------------------------------------------------------------------------------------------ No results for input(s): CHOL, HDL, LDLCALC, TRIG, CHOLHDL, LDLDIRECT in the last 72 hours.  Lab Results  Component Value Date   HGBA1C 8.3 (H) 03/14/2021   ------------------------------------------------------------------------------------------------------------------ No results for input(s): TSH, T4TOTAL, T3FREE, THYROIDAB in the last 72 hours.  Invalid input(s): FREET3  Cardiac Enzymes No results for input(s): CKMB, TROPONINI, MYOGLOBIN in the last 168 hours.  Invalid input(s): CK ------------------------------------------------------------------------------------------------------------------    Component Value Date/Time   BNP 37.7 03/24/2021 0542    Micro Results Recent Results (from the past 240 hour(s))  Aerobic/Anaerobic Culture (surgical/deep wound)     Status: None   Collection Time: 03/14/21  1:23 PM   Specimen: Abscess  Result Value Ref Range Status   Specimen Description ABSCESS  Final   Special Requests NONE  Final   Gram Stain   Final    ABUNDANT WBC PRESENT, PREDOMINANTLY PMN ABUNDANT GRAM POSITIVE COCCI IN CLUSTERS    Culture   Final    ABUNDANT METHICILLIN RESISTANT STAPHYLOCOCCUS AUREUS NO ANAEROBES ISOLATED Performed at [facility], [address], [place], [place] [address]    Report Status 03/19/2021 FINAL  Final   Organism ID, Bacteria METHICILLIN RESISTANT STAPHYLOCOCCUS AUREUS  Final      Susceptibility   Methicillin resistant staphylococcus aureus - MIC*    CIPROFLOXACIN >=8 RESISTANT Resistant     ERYTHROMYCIN >=8 RESISTANT Resistant     GENTAMICIN <=0.5 SENSITIVE Sensitive     OXACILLIN >=4 RESISTANT Resistant     TETRACYCLINE <=1 SENSITIVE Sensitive     VANCOMYCIN <=0.5 SENSITIVE Sensitive     TRIMETH/SULFA >=320 RESISTANT Resistant     CLINDAMYCIN  >=8 RESISTANT Resistant     RIFAMPIN <=0.5 SENSITIVE Sensitive     Inducible Clindamycin NEGATIVE Sensitive     * ABUNDANT METHICILLIN RESISTANT STAPHYLOCOCCUS AUREUS  Aerobic/Anaerobic Culture w Gram Stain (surgical/deep wound)     Status: None (Preliminary result)   Collection Time: 03/18/21  1:38 PM   Specimen: Abscess  Result Value Ref Range Status   Specimen Description ABSCESS  Final   Special Requests DRAIN CHEST WALL  Final   Gram Stain   Final    ABUNDANT WBC PRESENT, PREDOMINANTLY PMN ABUNDANT GRAM POSITIVE COCCI IN CLUSTERS Performed at [facility], [address] [place],  [address]    Culture   Final    ABUNDANT METHICILLIN RESISTANT STAPHYLOCOCCUS AUREUS NO ANAEROBES ISOLATED; CULTURE IN PROGRESS FOR 5 DAYS    Report Status PENDING  Incomplete   Organism ID, Bacteria METHICILLIN RESISTANT STAPHYLOCOCCUS AUREUS  Final      Susceptibility   Methicillin resistant staphylococcus aureus - MIC*    CIPROFLOXACIN >=8 RESISTANT Resistant     ERYTHROMYCIN >=8 RESISTANT Resistant     GENTAMICIN <=0.5 SENSITIVE Sensitive     OXACILLIN >=4 RESISTANT Resistant     TETRACYCLINE <=1 SENSITIVE Sensitive     VANCOMYCIN <=0.5 SENSITIVE Sensitive     TRIMETH/SULFA >=320 RESISTANT Resistant     CLINDAMYCIN >=8 RESISTANT Resistant  RIFAMPIN <=0.5 SENSITIVE Sensitive     Inducible Clindamycin NEGATIVE Sensitive     * ABUNDANT METHICILLIN RESISTANT STAPHYLOCOCCUS AUREUS  Fungus Culture With Stain     Status: None (Preliminary result)   Collection Time: 03/19/21  3:07 PM   Specimen: Wound; Abscess  Result Value Ref Range Status   Fungus Stain Final report  Final    Comment: (NOTE) Performed At: [facility] [place], [place] [id number] Rush Farmer MD [id number]    Fungus (Mycology) Culture PENDING  Incomplete   Fungal Source ABSCESS  Final    Comment: RIGHT CHEST Performed at [facility], [place] [address], [place], [place]  [address]   Aerobic/Anaerobic Culture w Gram Stain (surgical/deep wound)     Status: None (Preliminary result)   Collection Time: 03/19/21  3:07 PM   Specimen: Wound; Abscess  Result Value Ref Range Status   Specimen Description ABSCESS RIGHT CHEST  Final   Special Requests NONE  Final   Gram Stain   Final    FEW WBC PRESENT, PREDOMINANTLY PMN FEW GRAM POSITIVE COCCI IN PAIRS IN CLUSTERS Performed at [name], [address] [place], [place] [address]    Culture   Final    FEW METHICILLIN RESISTANT STAPHYLOCOCCUS AUREUS NO ANAEROBES ISOLATED; CULTURE IN PROGRESS FOR 5 DAYS    Report Status PENDING  Incomplete   Organism ID, Bacteria METHICILLIN RESISTANT STAPHYLOCOCCUS AUREUS  Final      Susceptibility   Methicillin resistant staphylococcus aureus - MIC*    CIPROFLOXACIN >=8 RESISTANT Resistant     ERYTHROMYCIN >=8 RESISTANT Resistant     GENTAMICIN <=0.5 SENSITIVE Sensitive     OXACILLIN >=4 RESISTANT Resistant     TETRACYCLINE <=1 SENSITIVE Sensitive     VANCOMYCIN 1 SENSITIVE Sensitive     TRIMETH/SULFA >=320 RESISTANT Resistant     CLINDAMYCIN >=8 RESISTANT Resistant     RIFAMPIN <=0.5 SENSITIVE Sensitive     Inducible Clindamycin NEGATIVE Sensitive     * FEW METHICILLIN RESISTANT STAPHYLOCOCCUS AUREUS  Fungus Culture Result     Status: None   Collection Time: 03/19/21  3:07 PM  Result Value Ref Range Status   Result 1 Comment  Final    Comment: (NOTE) KOH/Calcofluor preparation:  no fungus observed. Performed At: [facility] [place], [place] [id number] Rush Farmer MD [id number]     Radiology Reports CT CHEST W CONTRAST  Result Date: 03/22/2021 CLINICAL DATA:  70 year old male with chest pain and shortness of breath. EXAM: CT CHEST WITH CONTRAST TECHNIQUE: Multidetector CT imaging of the chest was performed during intravenous contrast administration. CONTRAST:  26mL OMNIPAQUE IOHEXOL 300 MG/ML  SOLN COMPARISON:  Chest CT dated  03/17/2021. FINDINGS: Cardiovascular: There is no cardiomegaly or pericardial effusion. There is 3 vessel coronary vascular calcification. There is mild atherosclerotic calcification of the thoracic aorta. No aneurysmal dilatation or dissection. The origins of the great vessels of the aortic arch appear patent as visualized. No pulmonary artery embolus identified. Mediastinum/Nodes: No hilar or mediastinal adenopathy. The esophagus is grossly unremarkable. No mediastinal fluid collection. Lungs/Pleura: Small bilateral pleural effusions with partial compressive atelectasis of the lower lobes. Pneumonia is not excluded. Clinical correlation is recommended. No lobar consolidation or pneumothorax. The central airways are patent. Upper Abdomen: No acute abnormality. Musculoskeletal: Degenerative changes of the spine. There is an area of inflammatory changes with small pockets of air in the right anterior chest wall along the anterior right sixth rib. There  is fragmentation of the anterior right sixth rib concerning for osteomyelitis. Clinical correlation is recommended. No drainable fluid collection or abscess identified. There is thickening of the adjacent pleura. There is a large skin wound in the lower right anterior chest wall with a wound VAC. IMPRESSION: 1. No CT evidence of pulmonary embolism. 2. Small bilateral pleural effusions with partial compressive atelectasis of the lower lobes. Pneumonia is not excluded. Clinical correlation is recommended. 3. Large skin wound in the lower right anterior chest wall with a wound VAC. 4. Destructive changes of the anterior right sixth rib concerning for osteomyelitis. Clinical correlation is recommended. No drainable fluid collection or abscess. 5. Aortic Atherosclerosis (ICD10-I70.0). Electronically Signed   By: Anner Crete M.D.   On: 03/22/2021 19:48   CT CHEST W CONTRAST  Result Date: 03/17/2021 CLINICAL DATA:  Infected right chest wall hematoma EXAM: CT CHEST  WITH CONTRAST TECHNIQUE: Multidetector CT imaging of the chest was performed during intravenous contrast administration. CONTRAST:  75 mL OMNIPAQUE IOHEXOL 350 MG/ML SOLN COMPARISON:  03/13/2021 FINDINGS: Cardiovascular: Heart size within normal limits. Coronary artery calcifications seen throughout. No significant vascular abnormality identified. Mediastinum/Nodes: No enlarged mediastinal, hilar, or axillary lymph nodes. Lungs/Pleura: Trace bilateral pleural effusions with adjacent atelectasis. Upper Abdomen: No acute abnormality. Musculoskeletal: Previously seen right chest wall collection has decreased in size since prior examination, measuring approximately 7.4 x 7.1 x 3.7 cm compared to 10.1 x 9.1 x 5.7 cm on prior exam from 03/13/2021. The collection is somewhat bilobed. The inferior portion of the right chest wall collection has decreased to a greater degree than the superior lobulation. Greater amount of air within the collection likely due to presence of drains. Lucency and irregularity of the right sixth and seventh ribs, anterior segment again noted. IMPRESSION: 1. Interval decrease in size of bilobed right anterolateral chest wall fluid collection. The inferior lobulation is nearly completely resolved with some fat stranding and subcutaneous emphysema remaining. The more superior lobulation has decreased in size since prior study from 03/13/2021. The overall size of the collections now measures 7.4 x 7.1 x 3.7 cm compared to 10.1 x 9.1 x 5.7 cm on 03/13/2021. 2. Lucency in the anterior segments of the right sixth and seventh ribs again seen. Findings are suspicious for osteomyelitis, however underlying pathologic fracture is also possible. Electronically Signed   By: Miachel Roux M.D.   On: 03/17/2021 14:05   CT Chest W Contrast  Result Date: 03/13/2021 CLINICAL DATA:  Follow-up right chest wall hematoma and fractures of the right sixth through eighth ribs. Diabetes. HIV. EXAM: CT CHEST WITH CONTRAST  TECHNIQUE: Multidetector CT imaging of the chest was performed during intravenous contrast administration. CONTRAST:  135mL OMNIPAQUE IOHEXOL 300 MG/ML  SOLN COMPARISON:  03/09/2021 FINDINGS: Cardiovascular: Heart size remains normal. No pericardial fluid. Coronary artery calcification as seen previously. Aortic atherosclerotic calcification as seen previously. Mediastinum/Nodes: Normal Lungs/Pleura: Scarring/atelectasis at the lung bases left more than right unchanged since the previous study. Small areas of patchy density in the superior segment of the right lower lobe unchanged from the study of 4 days ago. No new or progressive pulmonary finding. No pneumothorax. Upper Abdomen: No upper abdominal injury or acute finding. Musculoskeletal: There is increasing size of the right chest wall collections, primarily centered around the right sixth and eighth anterolateral ribs. Those ribs show a pattern of irregular destruction that look more like pathologic fractures due to infection rather than traumatic fractures. Air bubbles are now present within the collections. There is  mild extrapleural involvement in side of the ribcage, without frank breakthrough into the pleural space. Most of the collections manifest external to the ribcage. No new areas of involvement are seen. IMPRESSION: Increasing size of the right chest wall collections, primarily centered around the right sixth and eighth anterolateral ribs, with development of air bubbles. Those ribs show a pattern of irregular destruction that have progressed and look more like pathologic fractures due to infection rather than traumatic fractures. Air bubbles are now present within the collections and they probably represent abscesses. There is mild extrapleural involvement on the inner side of the ribcage, without frank breakthrough into the pleural space. The majority of the collections are on the outer side of the ribcage. No new areas of involvement are seen.  Case discussed with Dr. Regenia Skeeter at 1419 hours. Aortic Atherosclerosis (ICD10-I70.0). Electronically Signed   By: Nelson Chimes M.D.   On: 03/13/2021 14:19   CT Chest W Contrast  Result Date: 03/09/2021 CLINICAL DATA:  Fall 3 weeks prior with right rib fractures and persistent chest pain. EXAM: CT CHEST WITH CONTRAST TECHNIQUE: Multidetector CT imaging of the chest was performed during intravenous contrast administration. CONTRAST:  63mL OMNIPAQUE IOHEXOL 300 MG/ML  SOLN COMPARISON:  Chest radiograph from earlier today. 11/22/2019 chest CT angiogram. FINDINGS: Cardiovascular: Normal heart size. No significant pericardial effusion/thickening. Three-vessel coronary atherosclerosis. Atherosclerotic nonaneurysmal thoracic aorta. Top-normal caliber main pulmonary artery (3.0 cm diameter). No central pulmonary emboli. Mediastinum/Nodes: No discrete thyroid nodules. Unremarkable esophagus. No pathologically enlarged axillary, mediastinal or hilar lymph nodes. Lungs/Pleura: No pneumothorax. No pleural effusion. No acute consolidative airspace disease, lung masses or significant pulmonary nodules. Mild platelike atelectasis in the anterior basilar left lower lobe. Mild patchy ground-glass opacity in peripheral posterior right mid lung. Upper abdomen: No acute abnormality. Musculoskeletal: No aggressive appearing focal osseous lesions. Nondisplaced acute anterior right sixth, seventh and eighth rib fractures with surrounding chest wall hematoma measuring up to 8.1 x 4.9 cm in maximum axial dimensions at the level of the anterior right sixth rib fracture (series 2/image 114). Moderate thoracic spondylosis. IMPRESSION: 1. Nondisplaced acute anterior right sixth, seventh and eighth rib fractures with surrounding chest wall hematoma. No pneumothorax or hemothorax. No discrete bone lesions. Clinical follow-up advised to ensure resolution of the right chest wall hematoma. Any need for follow-up imaging should be based on  clinical assessment. 2. Mild patchy ground-glass opacity in the peripheral posterior right mid lung, nonspecific, favor mild pulmonary contusion. 3. Mild platelike atelectasis at the left lung base. 4. Three-vessel coronary atherosclerosis. 5. Aortic Atherosclerosis (ICD10-I70.0). Electronically Signed   By: Ilona Sorrel M.D.   On: 03/09/2021 13:03   IR US Guide Bx Asp/Drain  Result Date: 03/14/2021 INDICATION: 70 year old with history of fall and right rib fractures. Patient developed right chest hematomas. Patient is complaining of pain and recent CT demonstrates gas within the hematomas. Findings are concerning for infected hematomas. EXAM: PLACEMENT OF SUPERFICIAL RIGHT ANTERIOR CHEST WALL DRAIN USING ULTRASOUND GUIDANCE x 2 MEDICATIONS: Moderate sedation ANESTHESIA/SEDATION: Fentanyl 100 mcg IV; Versed 2.0 mg IV Moderate Sedation Time:  29 minutes The patient was continuously monitored during the procedure by the interventional radiology nurse under my direct supervision. COMPLICATIONS: None immediate. PROCEDURE: Informed written consent was obtained from the patient after a thorough discussion of the procedural risks, benefits and alternatives. All questions were addressed. Maximal Sterile Barrier Technique was utilized including caps, mask, sterile gowns, sterile gloves, sterile drape, hand hygiene and skin antiseptic. A timeout was performed prior to the  initiation of the procedure. The right anterior chest wall hematomas were evaluated with ultrasound. Complex collections containing a small amount of fluid were obtained. The right anterior chest was prepped with chlorhexidine and sterile field was created. Attention was initially directed to the more inferior or caudal anterior chest wall collection. Skin was anesthetized with 1% lidocaine. Using ultrasound guidance, an 18 gauge trocar needle was directed into the collection and pink purulent fluid was aspirated. Superstiff Amplatz wire was advanced  into the complex collection and the tract was dilated to accommodate a 10 [place] drain. Catheter was sutured to the skin and attached to a suction bulb. A sample of fluid was sent for culture. A second area more cephalad was targeted with ultrasound guidance. Skin was anesthetized with 1% lidocaine. Using ultrasound guidance, an 18 gauge trocar needle was directed into the complex collection. Again, purulent fluid was aspirated. Superstiff Amplatz wire was advanced into this collection and a 10 French drain was placed. Additional purulent fluid was aspirated and the catheter was sutured to skin and attached to a suction bulb. Dressings were placed over both drains. FINDINGS: Patient has palpable hematomas in the right anterior chest and upper abdomen region. Ultrasound demonstrates heterogeneous collections in both areas containing a small amount of compressible fluid. There are echogenic areas within the collections compatible with known gas. 10 French drains were placed in both areas and purulent fluid was draining from both collections. Both collections are very complex and compatible with infected hematomas. IMPRESSION: Ultrasound-guided placement of 2 percutaneous drains within the superficial right anterior chest wall collections. Findings are compatible with infected hematomas. Fluid sample was sent for culture. Electronically Signed   By: Markus Daft M.D.   On: 03/14/2021 18:37   IR US Guide Bx Asp/Drain  Result Date: 03/14/2021 INDICATION: 70 year old with history of fall and right rib fractures. Patient developed right chest hematomas. Patient is complaining of pain and recent CT demonstrates gas within the hematomas. Findings are concerning for infected hematomas. EXAM: PLACEMENT OF SUPERFICIAL RIGHT ANTERIOR CHEST WALL DRAIN USING ULTRASOUND GUIDANCE x 2 MEDICATIONS: Moderate sedation ANESTHESIA/SEDATION: Fentanyl 100 mcg IV; Versed 2.0 mg IV Moderate Sedation Time:  29 minutes The patient was  continuously monitored during the procedure by the interventional radiology nurse under my direct supervision. COMPLICATIONS: None immediate. PROCEDURE: Informed written consent was obtained from the patient after a thorough discussion of the procedural risks, benefits and alternatives. All questions were addressed. Maximal Sterile Barrier Technique was utilized including caps, mask, sterile gowns, sterile gloves, sterile drape, hand hygiene and skin antiseptic. A timeout was performed prior to the initiation of the procedure. The right anterior chest wall hematomas were evaluated with ultrasound. Complex collections containing a small amount of fluid were obtained. The right anterior chest was prepped with chlorhexidine and sterile field was created. Attention was initially directed to the more inferior or caudal anterior chest wall collection. Skin was anesthetized with 1% lidocaine. Using ultrasound guidance, an 18 gauge trocar needle was directed into the collection and pink purulent fluid was aspirated. Superstiff Amplatz wire was advanced into the complex collection and the tract was dilated to accommodate a 10 [place] drain. Catheter was sutured to the skin and attached to a suction bulb. A sample of fluid was sent for culture. A second area more cephalad was targeted with ultrasound guidance. Skin was anesthetized with 1% lidocaine. Using ultrasound guidance, an 18 gauge trocar needle was directed into the complex collection. Again, purulent fluid was aspirated. Superstiff Amplatz wire  was advanced into this collection and a 10 [place] drain was placed. Additional purulent fluid was aspirated and the catheter was sutured to skin and attached to a suction bulb. Dressings were placed over both drains. FINDINGS: Patient has palpable hematomas in the right anterior chest and upper abdomen region. Ultrasound demonstrates heterogeneous collections in both areas containing a small amount of compressible fluid. There  are echogenic areas within the collections compatible with known gas. 10 French drains were placed in both areas and purulent fluid was draining from both collections. Both collections are very complex and compatible with infected hematomas. IMPRESSION: Ultrasound-guided placement of 2 percutaneous drains within the superficial right anterior chest wall collections. Findings are compatible with infected hematomas. Fluid sample was sent for culture. Electronically Signed   By: Markus Daft M.D.   On: 03/14/2021 18:37   DG Chest Port 1 View  Result Date: 03/13/2021 CLINICAL DATA:  Sepsis, right rib pain. EXAM: PORTABLE CHEST 1 VIEW COMPARISON:  March 09, 2021. FINDINGS: The heart size and mediastinal contours are within normal limits. Both lungs are clear. No pneumothorax or pleural effusion is noted. The visualized skeletal structures are unremarkable. IMPRESSION: No active disease. Electronically Signed   By: Marijo Conception M.D.   On: 03/13/2021 14:11   DG Chest Portable 1 View  Result Date: 03/09/2021 CLINICAL DATA:  Chest pain. Chest [place] hurting late yesterday. Complains of right-sided sternal pain and chest pain. EXAM: PORTABLE CHEST 1 VIEW COMPARISON:  None. FINDINGS: The heart size and mediastinal contours are within normal limits. Both lungs are clear. Blunting of the left costophrenic angle is noted, new from prior studies. The visualized skeletal structures are unremarkable. IMPRESSION: New blunting of left costophrenic angle may reflect small effusion. The lungs are otherwise clear. No signs of interstitial edema. Electronically Signed   By: Kerby Moors M.D.   On: 03/09/2021 11:26   [place] IMAGE GUIDED DRAINAGE BY PERCUTANEOUS CATHETER  Result Date: 03/18/2021 INDICATION: History of right-sided rib fractures complicated by development of infected adjacent hematomas, post ultrasound-guided placement of two percutaneous drainage catheters on 03/14/2021. Unfortunately, one of the percutaneous  drainage catheters was inadvertently removed with subsequent chest CT performed 03/17/2021 demonstrating a persistent undrained complex fluid collection involving the anterior aspect of the right chest wall. As such, request made for ultrasound-guided aspiration and/or drainage catheter placement for infection source control purposes. EXAM: 1. ULTRASOUND-GUIDED RIGHT CHEST WALL PERCUTANEOUS DRAINAGE CATHETER PLACEMENT X2 2. ULTRASOUND-GUIDED RIGHT CHEST WALL ABSCESS ASPIRATION COMPARISON:  Chest CT-03/17/2021; 03/13/2021; ultrasound-guided right chest wall percutaneous drainage catheter placement x2-03/14/2021 MEDICATIONS: The patient is currently admitted to the hospital and receiving intravenous antibiotics. The antibiotics were administered within an appropriate time frame prior to the initiation of the procedure. ANESTHESIA/SEDATION: Moderate (conscious) sedation was employed during this procedure. A total of Versed 4 mg and Fentanyl 200 mcg was administered intravenously. Moderate Sedation Time: 41 minutes. The patient's level of consciousness and vital signs were monitored continuously by radiology nursing throughout the procedure under my direct supervision. CONTRAST:  None COMPLICATIONS: None immediate. PROCEDURE: Informed written consent was obtained from the patient after a discussion of the risks, benefits and alternatives to treatment. Preprocedural ultrasound scanning demonstrated complex fluid involving the anterior aspect of the right chest wall compatible with the findings seen on preceding chest CT. A timeout was performed prior to the initiation of the procedure. The skin overlying the right anterior chest was prepped and draped in the usual sterile fashion. The overlying soft tissues were anesthetized  with 1% lidocaine with epinephrine. Under direct ultrasound guidance, a 18 gauge trocar needle was advanced into the complex abscess within the right chest wall, inferior to the nipple. A short  Amplatz wire was coiled within the collection. The track was dilated ultimately allowing placement of a 10 French percutaneous catheter. Multiple ultrasound images were saved procedural documentation purposes. Next, approximately 10 cc of purulent fluid was aspirated from the drainage catheter Sonographic evaluation demonstrates a residual complex fluid collection and as such the more medial component of the collection was accessed with an 18 gauge trocar needle. A short Amplatz wire was coiled within the collection and an additional 10 French percutaneous drainage catheter was placed under ultrasound guidance. Multiple ultrasound images were saved procedural documentation purposes. Next, approximately 30 cc of purulent fluid was aspirated from this more medially positioned chest wall drain. A representative sample was capped and sent to the laboratory for analysis. Next, the lateral component of the right chest wall collection was accessed with an 18 gauge trocar needle however only a small amount of bloody fluid was able to be aspirated. As such, a short Amplatz wire was coiled within the collection and the trocar needle was exchanged for a Yueh sheath catheter which was utilized to aspirate approximately 3 cc of thick bloody fluid. At this time, approximately 70 cc of additional blood-tinged purulent fluid was able to be expressed from the site of the previously removed percutaneous drainage catheter. Drainage catheters were secured at the entrance site within interrupted sutures and drainage catheters were connected to JP bulbs. Dressings were applied. The patient tolerated the procedure well without immediate postprocedural complication. IMPRESSION: 1. Successful ultrasound-guided placement of 2 two additional right anterior chest wall percutaneous drainage catheters yielding a total of 40 cc of purulent fluid. A representative aspirated sample was sent to the laboratory for analysis. 2. Successful  ultrasound-guided aspiration of approximately 3 cc of thick bloody fluid from the more lateral component of the right anterior chest wall complex hematoma. 3. Successful bedside expression of approximately 70 cc of purulent fluid from the entrance site of the recently removed percutaneous drainage catheter. Electronically Signed   By: Sandi Mariscal M.D.   On: 03/18/2021 15:29   [place] IMAGE GUIDED DRAINAGE BY PERCUTANEOUS CATHETER  Result Date: 03/18/2021 INDICATION: History of right-sided rib fractures complicated by development of infected adjacent hematomas, post ultrasound-guided placement of two percutaneous drainage catheters on 03/14/2021. Unfortunately, one of the percutaneous drainage catheters was inadvertently removed with subsequent chest CT performed 03/17/2021 demonstrating a persistent undrained complex fluid collection involving the anterior aspect of the right chest wall. As such, request made for ultrasound-guided aspiration and/or drainage catheter placement for infection source control purposes. EXAM: 1. ULTRASOUND-GUIDED RIGHT CHEST WALL PERCUTANEOUS DRAINAGE CATHETER PLACEMENT X2 2. ULTRASOUND-GUIDED RIGHT CHEST WALL ABSCESS ASPIRATION COMPARISON:  Chest CT-03/17/2021; 03/13/2021; ultrasound-guided right chest wall percutaneous drainage catheter placement x2-03/14/2021 MEDICATIONS: The patient is currently admitted to the hospital and receiving intravenous antibiotics. The antibiotics were administered within an appropriate time frame prior to the initiation of the procedure. ANESTHESIA/SEDATION: Moderate (conscious) sedation was employed during this procedure. A total of Versed 4 mg and Fentanyl 200 mcg was administered intravenously. Moderate Sedation Time: 41 minutes. The patient's level of consciousness and vital signs were monitored continuously by radiology nursing throughout the procedure under my direct supervision. CONTRAST:  None COMPLICATIONS: None immediate. PROCEDURE: Informed  written consent was obtained from the patient after a discussion of the risks, benefits and alternatives to treatment. Preprocedural  ultrasound scanning demonstrated complex fluid involving the anterior aspect of the right chest wall compatible with the findings seen on preceding chest CT. A timeout was performed prior to the initiation of the procedure. The skin overlying the right anterior chest was prepped and draped in the usual sterile fashion. The overlying soft tissues were anesthetized with 1% lidocaine with epinephrine. Under direct ultrasound guidance, a 18 gauge trocar needle was advanced into the complex abscess within the right chest wall, inferior to the nipple. A short Amplatz wire was coiled within the collection. The track was dilated ultimately allowing placement of a 10 French percutaneous catheter. Multiple ultrasound images were saved procedural documentation purposes. Next, approximately 10 cc of purulent fluid was aspirated from the drainage catheter Sonographic evaluation demonstrates a residual complex fluid collection and as such the more medial component of the collection was accessed with an 18 gauge trocar needle. A short Amplatz wire was coiled within the collection and an additional 10 French percutaneous drainage catheter was placed under ultrasound guidance. Multiple ultrasound images were saved procedural documentation purposes. Next, approximately 30 cc of purulent fluid was aspirated from this more medially positioned chest wall drain. A representative sample was capped and sent to the laboratory for analysis. Next, the lateral component of the right chest wall collection was accessed with an 18 gauge trocar needle however only a small amount of bloody fluid was able to be aspirated. As such, a short Amplatz wire was coiled within the collection and the trocar needle was exchanged for a Yueh sheath catheter which was utilized to aspirate approximately 3 cc of thick bloody fluid.  At this time, approximately 70 cc of additional blood-tinged purulent fluid was able to be expressed from the site of the previously removed percutaneous drainage catheter. Drainage catheters were secured at the entrance site within interrupted sutures and drainage catheters were connected to JP bulbs. Dressings were applied. The patient tolerated the procedure well without immediate postprocedural complication. IMPRESSION: 1. Successful ultrasound-guided placement of 2 two additional right anterior chest wall percutaneous drainage catheters yielding a total of 40 cc of purulent fluid. A representative aspirated sample was sent to the laboratory for analysis. 2. Successful ultrasound-guided aspiration of approximately 3 cc of thick bloody fluid from the more lateral component of the right anterior chest wall complex hematoma. 3. Successful bedside expression of approximately 70 cc of purulent fluid from the entrance site of the recently removed percutaneous drainage catheter. Electronically Signed   By: Sandi Mariscal M.D.   On: 03/18/2021 15:29   [place] FINE NEEDLE ASP 1ST LESION  Result Date: 03/18/2021 INDICATION: History of right-sided rib fractures complicated by development of infected adjacent hematomas, post ultrasound-guided placement of two percutaneous drainage catheters on 03/14/2021. Unfortunately, one of the percutaneous drainage catheters was inadvertently removed with subsequent chest CT performed 03/17/2021 demonstrating a persistent undrained complex fluid collection involving the anterior aspect of the right chest wall. As such, request made for ultrasound-guided aspiration and/or drainage catheter placement for infection source control purposes. EXAM: 1. ULTRASOUND-GUIDED RIGHT CHEST WALL PERCUTANEOUS DRAINAGE CATHETER PLACEMENT X2 2. ULTRASOUND-GUIDED RIGHT CHEST WALL ABSCESS ASPIRATION COMPARISON:  Chest CT-03/17/2021; 03/13/2021; ultrasound-guided right chest wall percutaneous drainage catheter  placement x2-03/14/2021 MEDICATIONS: The patient is currently admitted to the hospital and receiving intravenous antibiotics. The antibiotics were administered within an appropriate time frame prior to the initiation of the procedure. ANESTHESIA/SEDATION: Moderate (conscious) sedation was employed during this procedure. A total of Versed 4 mg and Fentanyl 200 mcg was administered  intravenously. Moderate Sedation Time: 41 minutes. The patient's level of consciousness and vital signs were monitored continuously by radiology nursing throughout the procedure under my direct supervision. CONTRAST:  None COMPLICATIONS: None immediate. PROCEDURE: Informed written consent was obtained from the patient after a discussion of the risks, benefits and alternatives to treatment. Preprocedural ultrasound scanning demonstrated complex fluid involving the anterior aspect of the right chest wall compatible with the findings seen on preceding chest CT. A timeout was performed prior to the initiation of the procedure. The skin overlying the right anterior chest was prepped and draped in the usual sterile fashion. The overlying soft tissues were anesthetized with 1% lidocaine with epinephrine. Under direct ultrasound guidance, a 18 gauge trocar needle was advanced into the complex abscess within the right chest wall, inferior to the nipple. A short Amplatz wire was coiled within the collection. The track was dilated ultimately allowing placement of a 10 French percutaneous catheter. Multiple ultrasound images were saved procedural documentation purposes. Next, approximately 10 cc of purulent fluid was aspirated from the drainage catheter Sonographic evaluation demonstrates a residual complex fluid collection and as such the more medial component of the collection was accessed with an 18 gauge trocar needle. A short Amplatz wire was coiled within the collection and an additional 10 French percutaneous drainage catheter was placed under  ultrasound guidance. Multiple ultrasound images were saved procedural documentation purposes. Next, approximately 30 cc of purulent fluid was aspirated from this more medially positioned chest wall drain. A representative sample was capped and sent to the laboratory for analysis. Next, the lateral component of the right chest wall collection was accessed with an 18 gauge trocar needle however only a small amount of bloody fluid was able to be aspirated. As such, a short Amplatz wire was coiled within the collection and the trocar needle was exchanged for a Yueh sheath catheter which was utilized to aspirate approximately 3 cc of thick bloody fluid. At this time, approximately 70 cc of additional blood-tinged purulent fluid was able to be expressed from the site of the previously removed percutaneous drainage catheter. Drainage catheters were secured at the entrance site within interrupted sutures and drainage catheters were connected to JP bulbs. Dressings were applied. The patient tolerated the procedure well without immediate postprocedural complication. IMPRESSION: 1. Successful ultrasound-guided placement of 2 two additional right anterior chest wall percutaneous drainage catheters yielding a total of 40 cc of purulent fluid. A representative aspirated sample was sent to the laboratory for analysis. 2. Successful ultrasound-guided aspiration of approximately 3 cc of thick bloody fluid from the more lateral component of the right anterior chest wall complex hematoma. 3. Successful bedside expression of approximately 70 cc of purulent fluid from the entrance site of the recently removed percutaneous drainage catheter. Electronically Signed   By: Sandi Mariscal M.D.   On: 03/18/2021 15:29
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
## 2021-03-25 ENCOUNTER — Inpatient Hospital Stay (HOSPITAL_COMMUNITY): Payer: Medicare Other | Admitting: Anesthesiology

## 2021-03-25 ENCOUNTER — Encounter (HOSPITAL_COMMUNITY): Payer: Self-pay | Admitting: Internal Medicine

## 2021-03-25 ENCOUNTER — Encounter (HOSPITAL_COMMUNITY)
Admission: EM | Disposition: A | Discharge status: Home Health | Payer: Self-pay | Source: Other Acute Inpatient Hospital | Attending: Internal Medicine

## 2021-03-25 DIAGNOSIS — J869 Pyothorax without fistula: Secondary | ICD-10-CM

## 2021-03-25 DIAGNOSIS — R6520 Severe sepsis without septic shock: Secondary | ICD-10-CM | POA: Diagnosis not present

## 2021-03-25 DIAGNOSIS — B182 Chronic viral hepatitis C: Secondary | ICD-10-CM | POA: Diagnosis not present

## 2021-03-25 DIAGNOSIS — B20 Human immunodeficiency virus [HIV] disease: Secondary | ICD-10-CM | POA: Diagnosis not present

## 2021-03-25 DIAGNOSIS — A419 Sepsis, unspecified organism: Secondary | ICD-10-CM | POA: Diagnosis not present

## 2021-03-25 DIAGNOSIS — N179 Acute kidney failure, unspecified: Secondary | ICD-10-CM | POA: Diagnosis not present

## 2021-03-25 DIAGNOSIS — L02213 Cutaneous abscess of chest wall: Secondary | ICD-10-CM

## 2021-03-25 DIAGNOSIS — M8610 Other acute osteomyelitis, unspecified site: Secondary | ICD-10-CM | POA: Diagnosis not present

## 2021-03-25 HISTORY — PX: APPLICATION OF WOUND VAC: SHX5189

## 2021-03-25 HISTORY — PX: INCISION AND DRAINAGE: SHX5863

## 2021-03-25 LAB — CBC WITH DIFFERENTIAL/PLATELET
Abs Immature Granulocytes: 0.03 10*3/uL (ref 0.00–0.07)
Basophils Absolute: 0 10*3/uL (ref 0.0–0.1)
Basophils Relative: 0 %
Eosinophils Absolute: 0.2 10*3/uL (ref 0.0–0.5)
Eosinophils Relative: 4 %
HCT: 31.4 % — ABNORMAL LOW (ref 39.0–52.0)
Hemoglobin: 10.2 g/dL — ABNORMAL LOW (ref 13.0–17.0)
Immature Granulocytes: 1 %
Lymphocytes Relative: 23 %
Lymphs Abs: 1.5 10*3/uL (ref 0.7–4.0)
MCH: 32.3 pg (ref 26.0–34.0)
MCHC: 32.5 g/dL (ref 30.0–36.0)
MCV: 99.4 fL (ref 80.0–100.0)
Monocytes Absolute: 0.7 10*3/uL (ref 0.1–1.0)
Monocytes Relative: 10 %
Neutro Abs: 4.1 10*3/uL (ref 1.7–7.7)
Neutrophils Relative %: 62 %
Platelets: 364 10*3/uL (ref 150–400)
RBC: 3.16 MIL/uL — ABNORMAL LOW (ref 4.22–5.81)
RDW: 13.3 % (ref 11.5–15.5)
WBC: 6.5 10*3/uL (ref 4.0–10.5)
nRBC: 0 % (ref 0.0–0.2)

## 2021-03-25 LAB — COMPREHENSIVE METABOLIC PANEL
ALT: 10 U/L (ref 0–44)
AST: 13 U/L — ABNORMAL LOW (ref 15–41)
Albumin: 2.4 g/dL — ABNORMAL LOW (ref 3.5–5.0)
Alkaline Phosphatase: 82 U/L (ref 38–126)
Anion gap: 7 (ref 5–15)
BUN: 29 mg/dL — ABNORMAL HIGH (ref 8–23)
CO2: 33 mmol/L — ABNORMAL HIGH (ref 22–32)
Calcium: 8.9 mg/dL (ref 8.9–10.3)
Chloride: 97 mmol/L — ABNORMAL LOW (ref 98–111)
Creatinine, Ser: 1.36 mg/dL — ABNORMAL HIGH (ref 0.61–1.24)
GFR, Estimated: 57 mL/min — ABNORMAL LOW (ref >60)
Glucose, Bld: 173 mg/dL — ABNORMAL HIGH (ref 70–99)
Potassium: 4.5 mmol/L (ref 3.5–5.1)
Sodium: 137 mmol/L (ref 135–145)
Total Bilirubin: 0.5 mg/dL (ref 0.3–1.2)
Total Protein: 6.2 g/dL — ABNORMAL LOW (ref 6.5–8.1)

## 2021-03-25 LAB — GLUCOSE, CAPILLARY
Glucose-Capillary: 121 mg/dL — ABNORMAL HIGH (ref 70–99)
Glucose-Capillary: 148 mg/dL — ABNORMAL HIGH (ref 70–99)
Glucose-Capillary: 104 mg/dL — ABNORMAL HIGH (ref 70–99)
Glucose-Capillary: 145 mg/dL — ABNORMAL HIGH (ref 70–99)
Glucose-Capillary: 163 mg/dL — ABNORMAL HIGH (ref 70–99)

## 2021-03-25 LAB — C-REACTIVE PROTEIN: CRP: 2.5 mg/dL — ABNORMAL HIGH (ref <1.0)

## 2021-03-25 LAB — BRAIN NATRIURETIC PEPTIDE: B Natriuretic Peptide: 27.3 pg/mL (ref 0.0–100.0)

## 2021-03-25 LAB — MAGNESIUM: Magnesium: 1.9 mg/dL (ref 1.7–2.4)

## 2021-03-25 SURGERY — APPLICATION, WOUND VAC
Anesthesia: Monitor Anesthesia Care | Site: Chest | Laterality: Right

## 2021-03-25 MED ORDER — ONDANSETRON HCL 4 MG/2ML IJ SOLN
INTRAMUSCULAR | Status: DC | PRN
  Administered 2021-03-25: 4 mg via INTRAVENOUS

## 2021-03-25 MED ORDER — MIDAZOLAM HCL 2 MG/2ML IJ SOLN
INTRAMUSCULAR | Status: AC
  Filled 2021-03-25: qty 2

## 2021-03-25 MED ORDER — FENTANYL CITRATE (PF) 100 MCG/2ML IJ SOLN
INTRAMUSCULAR | Status: AC
  Filled 2021-03-25: qty 2

## 2021-03-25 MED ORDER — FENTANYL CITRATE (PF) 100 MCG/2ML IJ SOLN
25.0000 ug | INTRAMUSCULAR | Status: DC | PRN
  Administered 2021-03-25 (×2): 50 ug via INTRAVENOUS

## 2021-03-25 MED ORDER — LACTATED RINGERS IV SOLN: INTRAVENOUS | Status: DC

## 2021-03-25 MED ORDER — PROPOFOL 10 MG/ML IV BOLUS
INTRAVENOUS | Status: DC | PRN
  Administered 2021-03-25: 40 mg via INTRAVENOUS

## 2021-03-25 MED ORDER — FENTANYL CITRATE (PF) 250 MCG/5ML IJ SOLN
INTRAMUSCULAR | Status: DC | PRN
  Administered 2021-03-25: 50 ug via INTRAVENOUS

## 2021-03-25 MED ORDER — CHLORHEXIDINE GLUCONATE 0.12 % MT SOLN
15.0000 mL | OROMUCOSAL | Status: AC
  Administered 2021-03-25: 15 mL via OROMUCOSAL
  Filled 2021-03-25 (×2): qty 15

## 2021-03-25 MED ORDER — FENTANYL CITRATE (PF) 250 MCG/5ML IJ SOLN
INTRAMUSCULAR | Status: AC
  Filled 2021-03-25: qty 5

## 2021-03-25 MED ORDER — ONDANSETRON HCL 4 MG/2ML IJ SOLN: 4.0000 mg | Freq: Once | INTRAMUSCULAR | Status: DC | PRN

## 2021-03-25 MED ORDER — MIDAZOLAM HCL 2 MG/2ML IJ SOLN
INTRAMUSCULAR | Status: DC | PRN
  Administered 2021-03-25: 2 mg via INTRAVENOUS

## 2021-03-25 MED ORDER — SODIUM CHLORIDE 0.9 % IR SOLN
Status: DC | PRN
  Administered 2021-03-25: 1000 mL

## 2021-03-25 MED ORDER — PROPOFOL 500 MG/50ML IV EMUL
INTRAVENOUS | Status: DC | PRN
  Administered 2021-03-25: 100 ug/kg/min via INTRAVENOUS

## 2021-03-25 MED ORDER — LACTATED RINGERS IV SOLN: INTRAVENOUS | Status: DC | PRN

## 2021-03-25 SURGICAL SUPPLY — 31 items
APL SKNCLS STERI-STRIP NONHPOA (GAUZE/BANDAGES/DRESSINGS)
BAG DECANTER FOR FLEXI CONT (MISCELLANEOUS) ×2 IMPLANT
BENZOIN TINCTURE PRP APPL 2/3 (GAUZE/BANDAGES/DRESSINGS) IMPLANT
BLADE CLIPPER SURG (BLADE) ×2 IMPLANT
BNDG GAUZE ELAST 4 BULKY (GAUZE/BANDAGES/DRESSINGS) IMPLANT
CANISTER SUCT 3000ML PPV (MISCELLANEOUS) ×2 IMPLANT
CANISTER WOUND CARE 500ML ATS (WOUND CARE) ×2 IMPLANT
COVER SURGICAL LIGHT HANDLE (MISCELLANEOUS) ×4 IMPLANT
DRAPE LAPAROSCOPIC ABDOMINAL (DRAPES) ×2 IMPLANT
DRSG VAC ATS MED SENSATRAC (GAUZE/BANDAGES/DRESSINGS) ×2 IMPLANT
ELECT REM PT RETURN 9FT ADLT (ELECTROSURGICAL) ×2
ELECTRODE REM PT RTRN 9FT ADLT (ELECTROSURGICAL) ×1 IMPLANT
GAUZE SPONGE 4X4 12PLY STRL (GAUZE/BANDAGES/DRESSINGS) ×2 IMPLANT
GAUZE XEROFORM 5X9 LF (GAUZE/BANDAGES/DRESSINGS) IMPLANT
GLOVE BIO SURGEON STRL SZ7 (GLOVE) ×2 IMPLANT
GLOVE BIO SURGEON STRL SZ7.5 (GLOVE) ×2 IMPLANT
GOWN STRL REUS W/ TWL LRG LVL3 (GOWN DISPOSABLE) ×2 IMPLANT
GOWN STRL REUS W/ TWL XL LVL3 (GOWN DISPOSABLE) ×1 IMPLANT
GOWN STRL REUS W/TWL LRG LVL3 (GOWN DISPOSABLE) ×4
GOWN STRL REUS W/TWL XL LVL3 (GOWN DISPOSABLE) ×2
KIT BASIN OR (CUSTOM PROCEDURE TRAY) ×2 IMPLANT
KIT TURNOVER KIT B (KITS) ×2 IMPLANT
NS IRRIG 1000ML POUR BTL (IV SOLUTION) ×2 IMPLANT
PACK GENERAL/GYN (CUSTOM PROCEDURE TRAY) ×2 IMPLANT
PAD ARMBOARD 7.5X6 YLW CONV (MISCELLANEOUS) ×4 IMPLANT
SOL PREP POV-IOD 4OZ 10% (MISCELLANEOUS) IMPLANT
SPONGE LAP 18X18 RF (DISPOSABLE) ×2 IMPLANT
SYR 5ML LL (SYRINGE) IMPLANT
TOWEL GREEN STERILE (TOWEL DISPOSABLE) ×2 IMPLANT
TOWEL GREEN STERILE FF (TOWEL DISPOSABLE) ×2 IMPLANT
WATER STERILE IRR 1000ML POUR (IV SOLUTION) ×2 IMPLANT

## 2021-03-25 NOTE — Progress Notes (Signed)
     [address]       [place] [address]             [phone]       No events  Vitals:   03/24/21 2030 03/25/21 0442  BP: 123/78 (!) 91/56  Pulse: 83 69  Resp: 18 18  Temp: 98.5 F (36.9 C) (!) 97.5 F (36.4 C)  SpO2: 99% 100%   Alert NAD Sinus  OR today for debridement and wound vac change  Kaydynce Pat O Tarena Gockley

## 2021-03-25 NOTE — Transfer of Care (Signed)
Immediate Anesthesia Transfer of Care Note  Patient: NASER SCHULD  Procedure(s) Performed: APPLICATION OF WOUND VAC (Right Chest) INCISION AND DRAINAGE OF CHEST WALL (Right Chest)  Patient Location: PACU  Anesthesia Type:MAC  Level of Consciousness: awake, alert  and oriented  Airway & Oxygen Therapy: Patient Spontanous Breathing  Post-op Assessment: Report given to RN and Post -op Vital signs reviewed and stable  Post vital signs: Reviewed and stable  Last Vitals:  Vitals Value Taken Time  BP 126/98 03/25/21 1232  Temp    Pulse    Resp 11 03/25/21 1234  SpO2    Vitals shown include unvalidated device data.  Last Pain:  Vitals:   03/25/21 0844  TempSrc:   PainSc: 4       Patients Stated Pain Goal: 6 (83/29/19 1660)  Complications: No complications documented.

## 2021-03-25 NOTE — Progress Notes (Signed)
Nutrition Follow-up  DOCUMENTATION CODES:   Severe malnutrition in context of acute illness/injury  INTERVENTION:    Glucerna Shake po once daily, each supplement provides 220 kcal and 10 grams of protein  Ensure Enlive po BID, each supplement provides 350 kcal and 20 grams of protein  D/C Prosource Plus, patient not taking  Vital Cuisine Shake TID with meals, each supplement provides 520 kcal and 22 grams of protein  MVI with minerals daily  Encourage POs  NUTRITION DIAGNOSIS:   Severe Malnutrition related to acute illness (rib fractures S/P surgeries and drain placement causing pain) as evidenced by severe muscle depletion,severe fat depletion.  Ongoing  GOAL:   Patient will meet greater than or equal to 90% of their needs  Progressing  MONITOR:   PO intake,Supplement acceptance,Labs  REASON FOR ASSESSMENT:   Malnutrition Screening Tool    ASSESSMENT:   70 yo male admitted with chest wall abscess formation and cellulitis. Recent R side rib cage fracture S/P fall 02/22/21. PMH includes hepatitis C, HTN, HLD, DM-2, chronic pain syndrome, IVDU (abstinent since 2019), MDD, HIV.    Patient has been NPO for repeat I&D of chest wall and wound VAC change today in the OR.  Previously on a Carb modified diet, meal intakes documented at 25-50% of meals. He is drinking Ensure supplements 1-2 times per day and Glucerna shakes once daily. He does not like the Prosource Plus, will d/c.  Labs reviewed.  CBG: [phone]  Medications reviewed and include Novolog SSI, MVI with minerals, Flomax.  Nutrition Focused Physical Exam  Flowsheet Row Most Recent Value  Orbital Region Severe depletion  Upper Arm Region Severe depletion  Thoracic and Lumbar Region Moderate depletion  Buccal Region Severe depletion  Temple Region Severe depletion  Clavicle Bone Region Severe depletion  Clavicle and Acromion Bone Region Severe depletion  Scapular Bone Region Unable to assess   Dorsal Hand Moderate depletion  Patellar Region Moderate depletion  Anterior Thigh Region Moderate depletion  Posterior Calf Region Moderate depletion  Edema (RD Assessment) None  Hair Reviewed  [thin]  Eyes Reviewed  Mouth Reviewed  Dune.[place] dry]  Skin Reviewed  Nails Reviewed     Diet Order:   Diet Order            Diet Carb Modified Fluid consistency: Thin; Room service appropriate? Yes  Diet effective now                 EDUCATION NEEDS:   Education needs have been addressed  Skin:  Skin Assessment: Skin Integrity Issues: Skin Integrity Issues:: Wound VAC Wound Vac: R chest wall abscess S/P I&D x 2 Other: -  Last BM:  4/2  Height:   Ht Readings from Last 1 Encounters:  03/25/21 6' (1.829 m)    Weight:   Wt Readings from Last 1 Encounters:  03/25/21 85.2 kg    Ideal Body Weight:  80.9 kg  BMI:  Body mass index is 25.47 kg/m.  Estimated Nutritional Needs:   Kcal:  2050-2250  Protein:  110-125 gm  Fluid:  >/= 2 L    Lucas Mallow, RD, LDN, CNSC Please refer to Amion for contact information.

## 2021-03-25 NOTE — Op Note (Signed)
      [address]       [place] [address]             [phone]                                          03/25/2021 Patient:  Brian Malone Pre-Op Dx: Chest wall abscess    Post-op Dx:  same Procedure: Chest wall debridement Wound vac change  Surgeon and Role:      * Daemien Fronczak, Lucile Crater, MD - Primary  Anesthesia  general EBL:  Minimal  Blood Administration: none   Drains: wound vac   Counts: correct   Indications: 70 yo male with multiple medical problems including HIV and Hep C, is admitted with infected chest wall abscess that has failed percutaneous drainage. He has undergone one debridement, and comes to the OR today for a wound vac change  Findings: Good granulation base.  Cell matrix remained in place.  The wound measured 13 X 9.5cm  Operative Technique: The patient was brought to the operative theatre.  Anesthesia was induced, and the patient was prepped and draped in normal sterile fashion.  An appropriate surgical pause was performed, and pre-operative antibiotics were dosed accordingly.  The wound was explored, and a small area of necrotic tissue was debrided.  The wound vac was re-applied.   The patient tolerated the procedure without any immediate complications, and was transferred to the ICU in guarded condition.  Lucretia Pendley Bary Leriche

## 2021-03-25 NOTE — Progress Notes (Signed)
[facility] Infectious Diseases Follow Up Note  Patient Identification: Patient Name: Brian Malone MRN: [id number] [place] Date: 03/13/2021 11:58 AM Age: 70 y.o.Today's Date: 03/25/2021   Reason for Visit: Follow up on Sternal wall abscess and osteomyelitis   Principal Problem:   Sepsis ([place]) Active Problems:   HIV (human immunodeficiency virus infection) ([place])   Anxiety disorder   Essential hypertension   AKI (acute kidney injury) ([place])   Lactic acidosis   Abscess of chest wall   Mixed diabetic hyperlipidemia associated with type 2 diabetes mellitus ([place])   GERD without esophagitis   BPH (benign prostatic hyperplasia)   Chronic hepatitis C without hepatic coma (H[facility])   Protein-calorie malnutrition, severe   Osteomyelitis (H[facility])   Antibiotics: Vancomycin 3/25-c  Triumeq Mavyret   Interval Events: Continues to remain afebrile, no leukocytosis  Assessment Right chest wall abscess status post ultrasound-guided drain placement by IR 3/26 and chest wall debridement and wound VAC placement 3/31 by CT surgery.  Cultures are positive for MRSA.  Blood cultures are negative.  Anterior right sixth rib osteomyelitis  HIV well-controlled-follows up with a provider at [facility].  10/20/2020 HIV RNA not detected.  On Triumeq and reports compliance with ART for the most part.   Chronic hepatitis C-follows up with a provider at [facility], plan to complete 4 more weeks of Mavyret from 3/17. HCV RNA 3/17 not detected   History of IVDU   Recommendations Continue vancomycin, pharmacy to dose Planned for OR today for debridement and wound vac change Fu HIV RNA Monitor cbc, bmp and vanc trough  Rest of the management as per the primary team. Thank you for the consult. Please page with pertinent questions or concerns.  ______________________________________________________________________ Subjective patient off to OR.    Vitals BP 127/79 (BP Location: Right Arm)   Pulse 73   Temp 98.4 F (36.9 C)   Resp 12   Ht 6' (1.829 m)   Wt 85.2 kg   SpO2 98%   BMI 25.47 kg/m   Pertinent Microbiology Results for orders placed or performed during the hospital encounter of 03/13/21  Urine culture     Status: None   Collection Time: 03/13/21 12:25 AM   Specimen: In/Out Cath Urine  Result Value Ref Range Status   Specimen Description IN/OUT CATH URINE  Final   Special Requests NONE  Final   Culture   Final    NO GROWTH Performed at [facility], [place] [address] [place], [place] [address]    Report Status 03/15/2021 FINAL  Final  Blood Culture (routine x 2)     Status: None   Collection Time: 03/13/21 12:25 PM   Specimen: Right Antecubital; Blood  Result Value Ref Range Status   Specimen Description   Final    RIGHT ANTECUBITAL BLOOD Performed at [facility], [address] [place], [place] [address]    Special Requests   Final    BOTTLES DRAWN AEROBIC AND ANAEROBIC Blood Culture results may not be optimal due to an inadequate volume of blood received in culture bottles Performed at [facility], [address] [place], [place] [address]    Culture   Final    NO GROWTH 5 DAYS Performed at [facility], [place] [address], [place], [place] [address]    Report Status 03/18/2021 FINAL  Final  Blood Culture (routine x 2)     Status: None   Collection Time: 03/13/21  1:15  PM   Specimen: BLOOD RIGHT HAND  Result Value Ref Range Status   Specimen Description   Final    BLOOD RIGHT HAND BLOOD Performed at [facility], [address] [place], [place] [address]    Special Requests   Final    BOTTLES DRAWN AEROBIC AND ANAEROBIC Blood Culture results may not be optimal due to an inadequate volume of blood received in culture bottles Performed at [name], [address] [place], [place] [address]    Culture   Final    NO GROWTH 5  DAYS Performed at [facility], [place] [address], [place], [place] [address]    Report Status 03/18/2021 FINAL  Final  Resp Panel by RT-PCR (Flu A&B, Covid) Nasopharyngeal Swab     Status: None   Collection Time: 03/13/21  2:01 PM   Specimen: Nasopharyngeal Swab; Nasopharyngeal(NP) swabs in vial transport medium  Result Value Ref Range Status   SARS Coronavirus 2 by RT PCR NEGATIVE NEGATIVE Final    Comment: (NOTE) SARS-CoV-2 target nucleic acids are NOT DETECTED.  The SARS-CoV-2 RNA is generally detectable in upper respiratory specimens during the acute phase of infection. The lowest concentration of SARS-CoV-2 viral copies this assay can detect is 138 copies/mL. A negative result does not preclude SARS-Cov-2 infection and should not be used as the sole basis for treatment or other patient management decisions. A negative result may occur with  improper specimen collection/handling, submission of specimen other than nasopharyngeal swab, presence of viral mutation(s) within the areas targeted by this assay, and inadequate number of viral copies(<138 copies/mL). A negative result must be combined with clinical observations, patient history, and epidemiological information. The expected result is Negative.  Fact Sheet for Patients:  EntrepreneurPulse.com.au  Fact Sheet for Healthcare Providers:  IncredibleEmployment.be  This test is no t yet approved or cleared by the [place] FDA and  has been authorized for detection and/or diagnosis of SARS-CoV-2 by FDA under an Emergency Use Authorization (EUA). This EUA will remain  in effect (meaning this test can be used) for the duration of the COVID-19 declaration under Section 564(b)(1) of the Act, 21 U.S.C.section 360bbb-3(b)(1), unless the authorization is terminated  or revoked sooner.       Influenza A by PCR NEGATIVE NEGATIVE Final   Influenza B by PCR NEGATIVE NEGATIVE Final     Comment: (NOTE) The Xpert Xpress SARS-CoV-2/FLU/RSV plus assay is intended as an aid in the diagnosis of influenza from Nasopharyngeal swab specimens and should not be used as a sole basis for treatment. Nasal washings and aspirates are unacceptable for Xpert Xpress SARS-CoV-2/FLU/RSV testing.  Fact Sheet for Patients: EntrepreneurPulse.com.au  Fact Sheet for Healthcare Providers: IncredibleEmployment.be  This test is not yet approved or cleared by the [place] FDA and has been authorized for detection and/or diagnosis of SARS-CoV-2 by FDA under an Emergency Use Authorization (EUA). This EUA will remain in effect (meaning this test can be used) for the duration of the COVID-19 declaration under Section 564(b)(1) of the Act, 21 U.S.C. section 360bbb-3(b)(1), unless the authorization is terminated or revoked.  Performed at [facility], [address], [place], [place] [address]   MRSA PCR Screening     Status: Abnormal   Collection Time: 03/13/21 11:51 PM   Specimen: Nasal Mucosa; Nasopharyngeal  Result Value Ref Range Status   MRSA by PCR POSITIVE (A) NEGATIVE Final    Comment:  The GeneXpert MRSA Assay (FDA approved for NASAL specimens only), is one component of a comprehensive MRSA colonization surveillance program. It is not intended to diagnose MRSA infection nor to guide or monitor treatment for MRSA infections. RESULT CALLED TO, READ BACK BY AND VERIFIED WITHDorna Bloom RN 03/14/21 0432 JDW Performed at [facility] [address] [place], [place] [address]   Aerobic/Anaerobic Culture (surgical/deep wound)     Status: None   Collection Time: 03/14/21  1:23 PM   Specimen: Abscess  Result Value Ref Range Status   Specimen Description ABSCESS  Final   Special Requests NONE  Final   Gram Stain   Final    ABUNDANT WBC PRESENT, PREDOMINANTLY PMN ABUNDANT GRAM POSITIVE COCCI IN CLUSTERS    Culture    Final    ABUNDANT METHICILLIN RESISTANT STAPHYLOCOCCUS AUREUS NO ANAEROBES ISOLATED Performed at [facility], [address] [place], [place] [address]    Report Status 03/19/2021 FINAL  Final   Organism ID, Bacteria METHICILLIN RESISTANT STAPHYLOCOCCUS AUREUS  Final      Susceptibility   Methicillin resistant staphylococcus aureus - MIC*    CIPROFLOXACIN >=8 RESISTANT Resistant     ERYTHROMYCIN >=8 RESISTANT Resistant     GENTAMICIN <=0.5 SENSITIVE Sensitive     OXACILLIN >=4 RESISTANT Resistant     TETRACYCLINE <=1 SENSITIVE Sensitive     VANCOMYCIN <=0.5 SENSITIVE Sensitive     TRIMETH/SULFA >=320 RESISTANT Resistant     CLINDAMYCIN >=8 RESISTANT Resistant     RIFAMPIN <=0.5 SENSITIVE Sensitive     Inducible Clindamycin NEGATIVE Sensitive     * ABUNDANT METHICILLIN RESISTANT STAPHYLOCOCCUS AUREUS  Aerobic/Anaerobic Culture w Gram Stain (surgical/deep wound)     Status: None   Collection Time: 03/18/21  1:38 PM   Specimen: Abscess  Result Value Ref Range Status   Specimen Description ABSCESS  Final   Special Requests DRAIN CHEST WALL  Final   Gram Stain   Final    ABUNDANT WBC PRESENT, PREDOMINANTLY PMN ABUNDANT GRAM POSITIVE COCCI IN CLUSTERS    Culture   Final    ABUNDANT METHICILLIN RESISTANT STAPHYLOCOCCUS AUREUS NO ANAEROBES ISOLATED Performed at [facility], [address], [place], [place] [address]    Report Status 03/24/2021 FINAL  Final   Organism ID, Bacteria METHICILLIN RESISTANT STAPHYLOCOCCUS AUREUS  Final      Susceptibility   Methicillin resistant staphylococcus aureus - MIC*    CIPROFLOXACIN >=8 RESISTANT Resistant     ERYTHROMYCIN >=8 RESISTANT Resistant     GENTAMICIN <=0.5 SENSITIVE Sensitive     OXACILLIN >=4 RESISTANT Resistant     TETRACYCLINE <=1 SENSITIVE Sensitive     VANCOMYCIN <=0.5 SENSITIVE Sensitive     TRIMETH/SULFA >=320 RESISTANT Resistant     CLINDAMYCIN >=8 RESISTANT Resistant     RIFAMPIN <=0.5 SENSITIVE Sensitive      Inducible Clindamycin NEGATIVE Sensitive     * ABUNDANT METHICILLIN RESISTANT STAPHYLOCOCCUS AUREUS  Fungus Culture With Stain     Status: None (Preliminary result)   Collection Time: 03/19/21  3:07 PM   Specimen: Wound; Abscess  Result Value Ref Range Status   Fungus Stain Final report  Final    Comment: (NOTE) Performed At: [facility] [place], [place] [id number] Rush Farmer MD [id number]    Fungus (Mycology) Culture PENDING  Incomplete   Fungal Source ABSCESS  Final    Comment: RIGHT CHEST Performed at [facility], [place]  [address], [place], [place] [address]   Aerobic/Anaerobic Culture w Gram Stain (surgical/deep wound)     Status: None   Collection Time: 03/19/21  3:07 PM   Specimen: Wound; Abscess  Result Value Ref Range Status   Specimen Description ABSCESS RIGHT CHEST  Final   Special Requests NONE  Final   Gram Stain   Final    FEW WBC PRESENT, PREDOMINANTLY PMN FEW GRAM POSITIVE COCCI IN PAIRS IN CLUSTERS    Culture   Final    FEW METHICILLIN RESISTANT STAPHYLOCOCCUS AUREUS NO ANAEROBES ISOLATED Performed at [facility], [place] [address], [place], [place] [address]    Report Status 03/24/2021 FINAL  Final   Organism ID, Bacteria METHICILLIN RESISTANT STAPHYLOCOCCUS AUREUS  Final      Susceptibility   Methicillin resistant staphylococcus aureus - MIC*    CIPROFLOXACIN >=8 RESISTANT Resistant     ERYTHROMYCIN >=8 RESISTANT Resistant     GENTAMICIN <=0.5 SENSITIVE Sensitive     OXACILLIN >=4 RESISTANT Resistant     TETRACYCLINE <=1 SENSITIVE Sensitive     VANCOMYCIN 1 SENSITIVE Sensitive     TRIMETH/SULFA >=320 RESISTANT Resistant     CLINDAMYCIN >=8 RESISTANT Resistant     RIFAMPIN <=0.5 SENSITIVE Sensitive     Inducible Clindamycin NEGATIVE Sensitive     * FEW METHICILLIN RESISTANT STAPHYLOCOCCUS AUREUS  Fungus Culture Result     Status: None   Collection Time: 03/19/21  3:07 PM  Result Value Ref Range Status    Result 1 Comment  Final    Comment: (NOTE) KOH/Calcofluor preparation:  no fungus observed. Performed At: [facility] [place], [place] [id number] Rush Farmer MD MK:[id number]      Pertinent Lab. CBC Latest Ref Rng & Units 03/25/2021 03/24/2021 03/23/2021  WBC 4.0 - 10.5 K/uL 6.5 8.0 5.7  Hemoglobin 13.0 - 17.0 g/dL 10.2(L) 10.2(L) 10.3(L)  Hematocrit 39.0 - 52.0 % 31.4(L) 31.2(L) 30.9(L)  Platelets 150 - 400 K/uL 364 391 314   CMP Latest Ref Rng & Units 03/25/2021 03/24/2021 03/23/2021  Glucose 70 - 99 mg/dL 173(H) 130(H) 127(H)  BUN 8 - 23 mg/dL 29(H) 28(H) 23  Creatinine 0.61 - 1.24 mg/dL 1.36(H) 1.29(H) 1.20  Sodium 135 - 145 mmol/L 137 136 134(L)  Potassium 3.5 - 5.1 mmol/L 4.5 4.8 4.5  Chloride 98 - 111 mmol/L 97(L) 97(L) 97(L)  CO2 22 - 32 mmol/L 33(H) 34(H) 30  Calcium 8.9 - 10.3 mg/dL 8.9 9.1 8.7(L)  Total Protein 6.5 - 8.1 g/dL 6.2(L) 6.9 6.2(L)  Total Bilirubin 0.3 - 1.2 mg/dL 0.5 0.6 0.6  Alkaline Phos 38 - 126 U/L 82 88 80  AST 15 - 41 U/L 13(L) 15 15  ALT 0 - 44 U/L 10 10 9      Pertinent Imaging today Plain films and CT images have been personally visualized and interpreted; radiology reports have been reviewed. Decision making incorporated into the Impression / Recommendations.  I have spent approx 30 minutes for this patient encounter including review of prior medical records, coordination of care  with greater than 50% of time being face to face/counseling and discussing diagnostics/treatment plan with the patient/family.  Electronically signed by:   Rosiland Oz, MD Infectious Disease Physician [facility] for Infectious Disease Pager: [phone]

## 2021-03-25 NOTE — Anesthesia Postprocedure Evaluation (Signed)
Anesthesia Post Note  Patient: Brian Malone  Procedure(s) Performed: APPLICATION OF WOUND VAC (Right Chest) INCISION AND DRAINAGE OF CHEST WALL (Right Chest)     Patient location during evaluation: PACU Anesthesia Type: MAC Level of consciousness: awake and alert and oriented Pain management: pain level controlled Vital Signs Assessment: post-procedure vital signs reviewed and stable Respiratory status: spontaneous breathing, nonlabored ventilation and respiratory function stable Cardiovascular status: stable and blood pressure returned to baseline Postop Assessment: no apparent nausea or vomiting Anesthetic complications: no   No complications documented.  Last Vitals:  Vitals:   03/25/21 1330 03/25/21 1355  BP: (!) 142/79 (!) 141/73  Pulse:  81  Resp: 14 18  Temp: 36.9 C 36.9 C  SpO2: 100% 100%    Last Pain:  Vitals:   03/25/21 1355  TempSrc: Axillary  PainSc:                  Mathilde Mcwherter A.

## 2021-03-25 NOTE — Anesthesia Preprocedure Evaluation (Addendum)
Anesthesia Evaluation  Patient identified by MRN, date of birth, ID band Patient awake    Reviewed: Allergy & Precautions, NPO status , Patient's Chart, lab work & pertinent test results, reviewed documented beta blocker date and time   Airway Mallampati: II  TM Distance: >3 FB Neck ROM: Full    Dental no notable dental hx. (+) Teeth Intact, Dental Advisory Given   Pulmonary pneumonia, resolved,    Pulmonary exam normal breath sounds clear to auscultation       Cardiovascular hypertension, Pt. on medications + CAD  Normal cardiovascular exam Rhythm:Regular Rate:Normal     Neuro/Psych  Headaches, PSYCHIATRIC DISORDERS Anxiety Depression Peripheral neuropathy  Neuromuscular disease    GI/Hepatic GERD  Medicated,(+)     substance abuse  IV drug use, Hepatitis -, CAbstinent since 2019   Endo/Other  diabetes, Poorly Controlled, Type 2  Renal/GU Renal diseaseHx/o AKI  negative genitourinary   Musculoskeletal  (+) Arthritis , Osteoarthritis,  narcotic dependentRight Chest wall abscess   Abdominal   Peds  Hematology  (+) anemia , HIV,   Anesthesia Other Findings   Reproductive/Obstetrics                            Anesthesia Physical Anesthesia Plan  ASA: III  Anesthesia Plan: MAC   Post-op Pain Management:    Induction: Intravenous  PONV Risk Score and Plan: 2 and Treatment may vary due to age or medical condition, Ondansetron, Midazolam and Propofol infusion  Airway Management Planned: Natural Airway and Simple Face Mask  Additional Equipment:   Intra-op Plan:   Post-operative Plan:   Informed Consent: I have reviewed the patients History and Physical, chart, labs and discussed the procedure including the risks, benefits and alternatives for the proposed anesthesia with the patient or authorized representative who has indicated his/her understanding and acceptance.        Plan Discussed with: CRNA and Anesthesiologist  Anesthesia Plan Comments:         Anesthesia Quick Evaluation

## 2021-03-25 NOTE — Progress Notes (Signed)
PROGRESS NOTE                                                                                                                                                                                                             Patient Demographics:    Brian Malone, is a 70 y.o. male, DOB - Feb 27, 1951, UVO:[id number]  Outpatient Primary MD for the patient is [name], [name], MD    LOS - 12  Admit date - 03/13/2021    Chief Complaint  Patient presents with  . Rib Injury       Brief Narrative (HPI from H&P)  70 year old male with past medical history of hepatitis C, hypertension, hyperlipidemia, diabetes mellitus type 2, chronic pain syndrome, intravenous drug use (abstinent since 2019), major depressive disorder and HIV who had a trip and fall in his yard on 02/22/2021 after which he hurt his right-sided chest wall, at that time he was diagnosed with right-sided rib cage fracture and injury, he was seen in the ER a few times since then and was treated conservatively, his pain and discomfort continued to get worse and he presented again to [place] on  03/13/2021 CT scan suggested that he had right chest wall abscess formation and cellulitis with concerns for sepsis.   Subjective:   Patient was seen and examined before he went to surgery today  Patient reports his pain is controlled on current regimen, denies any fever or chills.     Assessment  & Plan :    Sepsis due to right-sided chest wall abscess and possible right rib cage osteomyelitis formation after mechanical fall and multiple right rib fractures sustained on 02/22/2021  - he is on Daptomycin since 03/23/2021 previously was on Vancomycin, initially underwent drain placement by IR to the right chest wall abscess with minimal improvement in taken to the OR by cardiothoracic surgery on 03/19/2021 with open incision and drainage and wound  VAC placement.  Now abscess considerably improved.  Abscess fluid growing MRSA and there is question of osteomyelitis.   - ID consulted and following.   - Cardiothoracic surgery took patient back to the OR 4/6, for further chest wall debridement, and wound VAC change.   Encouraged the patient to sit up in chair in the daytime use I-S and flutter valve for pulmonary toiletry.  Will advance  activity and titrate down oxygen as possible.  Not a good candidate for PICC line and home IV antibiotics history of IV drug use in the past.   Chronic pain and narcotic use.  Supportive care as needed for acute discomfort, avoid overuse, as needed Narcan added.  Patient counseled see above.  He does exhibit narcotic seeking behavior, has been warned multiple times as this can lead to accidental overdose and even death friend in the room on March 24, 2021 also made aware of the concerns. Kindly see nursing documentation as well.   HIV - Continue home regimen follows with [facility] ID department.  Anxiety and depression.  Home medications continued.  Currently not homicidal or suicidal.  GERD.  On PPI.  BPH.  On Flomax.  AKI.  Due to ATN from sepsis, resolved after hydration, gentle hydration again on 03/24/2021.  Hypertension.  Blood pressure soft, skipped today's blood pressure medications will monitor closely.   DM type II.  On sliding scale monitor and adjust  Lab Results  Component Value Date   HGBA1C 8.3 (H) 03/14/2021   CBG (last 3)  Recent Labs    03/25/21 0652 03/25/21 0741 03/25/21 1234  GLUCAP 121* 148* 104*          Condition - Extremely Guarded  Family Communication  : None present bedside  Code Status :  Full  Consults  :  IR, cardiothoracic surgery, ID  PUD Prophylaxis : PPI   Procedures  :     Right chest wall I&D by Dr. Kipp Brood 03/19/21  US guided chest wall abscess drined by IR 03/14/21   CT -  Increasing size of the right chest wall collections, primarily  centered around the right sixth and eighth anterolateral ribs, with development of air bubbles. Those ribs show a pattern of irregular destruction that have progressed and look more like pathologic fractures due to infection rather than traumatic fractures. Air bubbles are now present within the collections and they probably represent abscesses. There is mild extrapleural involvement on the inner side of the ribcage, without frank breakthrough into the pleural space. The majority of the collections are on the outer side of the ribcage. No new areas of involvement are seen.  CT repeat 03/17/21 - 1. Interval decrease in size of bilobed right anterolateral chest wall fluid collection. The inferior lobulation is nearly completely resolved with some fat stranding and subcutaneous emphysema remaining. The more superior lobulation has decreased in size since prior study from 03/13/2021. The overall size of the collections now measures 7.4 x 7.1 x 3.7 cm compared to 10.1 x 9.1 x 5.7 cm on 03/13/2021. 2. Lucency in the anterior segments of the right sixth and seventh ribs again seen. Findings are suspicious for osteomyelitis, however underlying pathologic fracture is also possible.      Disposition Plan  :    Status is: Inpatient  Remains inpatient appropriate because:IV treatments appropriate due to intensity of illness or inability to take PO   Dispo: The patient is from: Home              Anticipated d/c is to: Home              Patient currently is not medically stable to d/c.   Difficult to place patient No  DVT Prophylaxis  :   Heparin   Lab Results  Component Value Date   PLT 364 03/25/2021    Diet :  Diet Order  Diet Carb Modified Fluid consistency: Thin; Room service appropriate? Yes  Diet effective now                  Inpatient Medications  Scheduled Meds: . (feeding supplement) PROSource Plus  30 mL Oral TID WC  . abacavir-dolutegravir-lamiVUDine  1 tablet Oral  Daily  . busPIRone  30 mg Oral BID  . clonazepam  0.5 mg Oral TID  . ezetimibe  10 mg Oral Daily  . feeding supplement  237 mL Oral BID BM  . feeding supplement (GLUCERNA SHAKE)  237 mL Oral Q24H  . fentaNYL      . Glecaprevir-Pibrentasvir  3 tablet Oral Daily  . heparin injection (subcutaneous)  5,000 Units Subcutaneous Q8H  . insulin aspart  0-15 Units Subcutaneous TID AC & HS  . metoprolol tartrate  25 mg Oral BID  . multivitamin with minerals  1 tablet Oral Daily  . pantoprazole  40 mg Oral QAC breakfast  . sodium chloride flush  5 mL Intracatheter Q8H  . tamsulosin  0.4 mg Oral QHS  . tiZANidine  2 mg Oral QHS   Continuous Infusions: . sodium chloride 50 mL/hr at 03/24/21 2342  . DAPTOmycin (CUBICIN)  IV 700 mg (03/24/21 2346)  . lactated ringers 10 mL/hr at 03/25/21 1007   PRN Meds:.sodium chloride, acetaminophen **OR** [DISCONTINUED] acetaminophen, antiseptic oral rinse, HYDROcodone-acetaminophen, HYDROmorphone (DILAUDID) injection **OR** [DISCONTINUED]  HYDROmorphone (DILAUDID) injection, naLOXone (NARCAN)  injection, [DISCONTINUED] ondansetron **OR** ondansetron (ZOFRAN) IV, polyethylene glycol, valACYclovir  Antibiotics  :    Anti-infectives (From admission, onward)   Start     Dose/Rate Route Frequency Ordered Stop   03/23/21 2000  DAPTOmycin (CUBICIN) 700 mg in sodium chloride 0.9 % IVPB        8 mg/kg  85.2 kg 128 mL/hr over 30 Minutes Intravenous Every 24 hours 03/23/21 1133     03/22/21 1200  vancomycin (VANCOREADY) IVPB 1250 mg/250 mL  Status:  Discontinued        1,250 mg 166.7 mL/hr over 90 Minutes Intravenous Every 24 hours 03/21/21 1319 03/23/21 1133   03/20/21 1000  Glecaprevir-Pibrentasivir 100-40 mg (Mavyret) tabs 3 tablets - patient's own supply        3 tablet Oral Daily 03/20/21 0733     03/15/21 1200  vancomycin (VANCOREADY) IVPB 1500 mg/300 mL  Status:  Discontinued        1,500 mg 150 mL/hr over 120 Minutes Intravenous Every 24 hours 03/15/21 0732  03/21/21 1319   03/14/21 1200  vancomycin (VANCOREADY) IVPB 1250 mg/250 mL  Status:  Discontinued        1,250 mg 166.7 mL/hr over 90 Minutes Intravenous Every 24 hours 03/13/21 1359 03/15/21 0732   03/14/21 1023  valACYclovir (VALTREX) tablet 1,000 mg       Note to Pharmacy: Trig neuralgia flare     1,000 mg Oral 3 times daily PRN 03/14/21 1023     03/14/21 1000  abacavir-dolutegravir-lamiVUDine (TRIUMEQ) 600-50-300 MG per tablet 1 tablet        1 tablet Oral Daily 03/13/21 2320     03/14/21 0200  meropenem (MERREM) 1 g in sodium chloride 0.9 % 100 mL IVPB  Status:  Discontinued        1 g 200 mL/hr over 30 Minutes Intravenous Every 8 hours 03/13/21 2337 03/16/21 0719   03/14/21 0100  ceFEPIme (MAXIPIME) 2 g in sodium chloride 0.9 % 100 mL IVPB  Status:  Discontinued       Note  to Pharmacy: Cefepime 2 g IV q12h for CrCl < 60 mL/min   2 g 200 mL/hr over 30 Minutes Intravenous Every 12 hours 03/13/21 2326 03/13/21 2331   03/13/21 2000  piperacillin-tazobactam (ZOSYN) IVPB 3.375 g  Status:  Discontinued        3.375 g 12.5 mL/hr over 240 Minutes Intravenous Every 8 hours 03/13/21 1402 03/13/21 2320   03/13/21 1430  clindamycin (CLEOCIN) IVPB 900 mg        900 mg 100 mL/hr over 30 Minutes Intravenous  Once 03/13/21 1418 03/13/21 1548   03/13/21 1230  vancomycin (VANCOCIN) IVPB 1000 mg/200 mL premix        1,000 mg 200 mL/hr over 60 Minutes Intravenous  Once 03/13/21 1229 03/13/21 1506   03/13/21 1230  piperacillin-tazobactam (ZOSYN) IVPB 3.375 g        3.375 g 100 mL/hr over 30 Minutes Intravenous  Once 03/13/21 1229 03/13/21 1400       Time Spent in minutes  30   Phillips Climes M.D on 03/25/2021 at 2:54 PM  To page go to [email or web address]   [facility] -  Office  [phone]    See all Orders from today for further details    Objective:   Vitals:   03/25/21 1302 03/25/21 1317 03/25/21 1330 03/25/21 1355  BP: 127/74 136/77 (!) 142/79 (!) 141/73  Pulse: 74 80  81   Resp: 12 12 14 18   Temp:   98.4 F (36.9 C) 98.4 F (36.9 C)  TempSrc:    Axillary  SpO2: 100% 99% 100% 100%  Weight:      Height:        Wt Readings from Last 3 Encounters:  03/25/21 85.2 kg  03/09/21 77.1 kg  01/22/21 72.6 kg     Intake/Output Summary (Last 24 hours) at 03/25/2021 1454 Last data filed at 03/25/2021 1300 Gross per 24 hour  Intake 400 ml  Output 1320 ml  Net -920 ml   Physical Exam  Awake Alert, Oriented X 3, No new F.N deficits, Normal affect Symmetrical Chest wall movement, Good air movement bilaterally, right lung base rhonchi, right chest wall wound VAC in place RRR,No Gallops,Rubs or new Murmurs, No Parasternal Heave +ve B.Sounds, Abd Soft, No tenderness, No rebound - guarding or rigidity. No Cyanosis, Clubbing or edema, No new Rash or bruise        Data Review:    CBC Recent Labs  Lab 03/20/21 0036 03/21/21 0252 03/23/21 0203 03/24/21 0542 03/25/21 0032  WBC 7.8 8.8 5.7 8.0 6.5  HGB 11.2* 10.1* 10.3* 10.2* 10.2*  HCT 33.6* 29.8* 30.9* 31.2* 31.4*  PLT 234 289 314 391 364  MCV 95.7 96.4 98.1 100.0 99.4  MCH 31.9 32.7 32.7 32.7 32.3  MCHC 33.3 33.9 33.3 32.7 32.5  RDW 12.8 12.9 13.2 13.3 13.3  LYMPHSABS 0.4* 1.7 1.5 1.7 1.5  MONOABS 0.3 0.8 0.6 0.7 0.7  EOSABS 0.0 0.1 0.1 0.3 0.2  BASOSABS 0.0 0.0 0.0 0.1 0.0    Recent Labs  Lab 03/19/21 0103 03/20/21 0036 03/21/21 0252 03/23/21 0203 03/24/21 0542 03/25/21 0032  NA 131* 132* 132* 134* 136 137  K 4.4 5.2* 4.3 4.5 4.8 4.5  CL 97* 97* 97* 97* 97* 97*  CO2 29 25 27 30  34* 33*  GLUCOSE 124* 266* 111* 127* 130* 173*  BUN 8 16 23 23  28* 29*  CREATININE 1.13 1.27* 1.21 1.20 1.29* 1.36*  CALCIUM 8.2* 8.3* 8.3* 8.7* 9.1 8.9  AST 13* 13*  14* 15 15 13*  ALT 8 11 9 9 10 10   ALKPHOS 90 87 81 80 88 82  BILITOT 0.9 0.7 0.6 0.6 0.6 0.5  ALBUMIN 1.8* 1.9* 2.1* 2.3* 2.4* 2.4*  MG 1.7 1.7 2.0 2.0 2.0 1.9  CRP  --   --   --  2.7* 2.6* 2.5*  PROCALCITON 0.27 0.12 0.10  --   --   --    BNP  --   --   --  84.1 37.7 27.3    ------------------------------------------------------------------------------------------------------------------ No results for input(s): CHOL, HDL, LDLCALC, TRIG, CHOLHDL, LDLDIRECT in the last 72 hours.  Lab Results  Component Value Date   HGBA1C 8.3 (H) 03/14/2021   ------------------------------------------------------------------------------------------------------------------ No results for input(s): TSH, T4TOTAL, T3FREE, THYROIDAB in the last 72 hours.  Invalid input(s): FREET3  Cardiac Enzymes No results for input(s): CKMB, TROPONINI, MYOGLOBIN in the last 168 hours.  Invalid input(s): CK ------------------------------------------------------------------------------------------------------------------    Component Value Date/Time   BNP 27.3 03/25/2021 0032    Micro Results Recent Results (from the past 240 hour(s))  Aerobic/Anaerobic Culture w Gram Stain (surgical/deep wound)     Status: None   Collection Time: 03/18/21  1:38 PM   Specimen: Abscess  Result Value Ref Range Status   Specimen Description ABSCESS  Final   Special Requests DRAIN CHEST WALL  Final   Gram Stain   Final    ABUNDANT WBC PRESENT, PREDOMINANTLY PMN ABUNDANT GRAM POSITIVE COCCI IN CLUSTERS    Culture   Final    ABUNDANT METHICILLIN RESISTANT STAPHYLOCOCCUS AUREUS NO ANAEROBES ISOLATED Performed at [facility], [address], [place], [place] [address]    Report Status 03/24/2021 FINAL  Final   Organism ID, Bacteria METHICILLIN RESISTANT STAPHYLOCOCCUS AUREUS  Final      Susceptibility   Methicillin resistant staphylococcus aureus - MIC*    CIPROFLOXACIN >=8 RESISTANT Resistant     ERYTHROMYCIN >=8 RESISTANT Resistant     GENTAMICIN <=0.5 SENSITIVE Sensitive     OXACILLIN >=4 RESISTANT Resistant     TETRACYCLINE <=1 SENSITIVE Sensitive     VANCOMYCIN <=0.5 SENSITIVE Sensitive     TRIMETH/SULFA >=320 RESISTANT Resistant     CLINDAMYCIN  >=8 RESISTANT Resistant     RIFAMPIN <=0.5 SENSITIVE Sensitive     Inducible Clindamycin NEGATIVE Sensitive     * ABUNDANT METHICILLIN RESISTANT STAPHYLOCOCCUS AUREUS  Fungus Culture With Stain     Status: None (Preliminary result)   Collection Time: 03/19/21  3:07 PM   Specimen: Wound; Abscess  Result Value Ref Range Status   Fungus Stain Final report  Final    Comment: (NOTE) Performed At: [facility] [place], [place] [id number] Rush Farmer MD RF:[id number]    Fungus (Mycology) Culture PENDING  Incomplete   Fungal Source ABSCESS  Final    Comment: RIGHT CHEST Performed at [facility], [place] [address] [place], [place] [address]   Aerobic/Anaerobic Culture w Gram Stain (surgical/deep wound)     Status: None   Collection Time: 03/19/21  3:07 PM   Specimen: Wound; Abscess  Result Value Ref Range Status   Specimen Description ABSCESS RIGHT CHEST  Final   Special Requests NONE  Final   Gram Stain   Final    FEW WBC PRESENT, PREDOMINANTLY PMN FEW GRAM POSITIVE COCCI IN PAIRS IN CLUSTERS    Culture   Final    FEW METHICILLIN RESISTANT STAPHYLOCOCCUS AUREUS NO ANAEROBES ISOLATED Performed at [facility], [address] [place], [place] [address]    Report Status 03/24/2021 FINAL  Final   Organism ID, Bacteria METHICILLIN RESISTANT STAPHYLOCOCCUS AUREUS  Final      Susceptibility   Methicillin resistant staphylococcus aureus - MIC*    CIPROFLOXACIN >=8 RESISTANT Resistant     ERYTHROMYCIN >=8 RESISTANT Resistant     GENTAMICIN <=0.5 SENSITIVE Sensitive     OXACILLIN >=4 RESISTANT Resistant     TETRACYCLINE <=1 SENSITIVE Sensitive     VANCOMYCIN 1 SENSITIVE Sensitive     TRIMETH/SULFA >=320 RESISTANT Resistant     CLINDAMYCIN >=8 RESISTANT Resistant     RIFAMPIN <=0.5 SENSITIVE Sensitive     Inducible Clindamycin NEGATIVE Sensitive     * FEW METHICILLIN RESISTANT STAPHYLOCOCCUS AUREUS  Fungus Culture Result     Status: None    Collection Time: 03/19/21  3:07 PM  Result Value Ref Range Status   Result 1 Comment  Final    Comment: (NOTE) KOH/Calcofluor preparation:  no fungus observed. Performed At: [facility] [place], [place] [id number] Rush Farmer MD JJ:[id number]     Radiology Reports CT CHEST W CONTRAST  Result Date: 03/22/2021 CLINICAL DATA:  70 year old male with chest pain and shortness of breath. EXAM: CT CHEST WITH CONTRAST TECHNIQUE: Multidetector CT imaging of the chest was performed during intravenous contrast administration. CONTRAST:  2mL OMNIPAQUE IOHEXOL 300 MG/ML  SOLN COMPARISON:  Chest CT dated 03/17/2021. FINDINGS: Cardiovascular: There is no cardiomegaly or pericardial effusion. There is 3 vessel coronary vascular calcification. There is mild atherosclerotic calcification of the thoracic aorta. No aneurysmal dilatation or dissection. The origins of the great vessels of the aortic arch appear patent as visualized. No pulmonary artery embolus identified. Mediastinum/Nodes: No hilar or mediastinal adenopathy. The esophagus is grossly unremarkable. No mediastinal fluid collection. Lungs/Pleura: Small bilateral pleural effusions with partial compressive atelectasis of the lower lobes. Pneumonia is not excluded. Clinical correlation is recommended. No lobar consolidation or pneumothorax. The central airways are patent. Upper Abdomen: No acute abnormality. Musculoskeletal: Degenerative changes of the spine. There is an area of inflammatory changes with small pockets of air in the right anterior chest wall along the anterior right sixth rib. There is fragmentation of the anterior right sixth rib concerning for osteomyelitis. Clinical correlation is recommended. No drainable fluid collection or abscess identified. There is thickening of the adjacent pleura. There is a large skin wound in the lower right anterior chest wall with a wound VAC. IMPRESSION: 1. No CT evidence of pulmonary  embolism. 2. Small bilateral pleural effusions with partial compressive atelectasis of the lower lobes. Pneumonia is not excluded. Clinical correlation is recommended. 3. Large skin wound in the lower right anterior chest wall with a wound VAC. 4. Destructive changes of the anterior right sixth rib concerning for osteomyelitis. Clinical correlation is recommended. No drainable fluid collection or abscess. 5. Aortic Atherosclerosis (ICD10-I70.0). Electronically Signed   By: Anner Crete M.D.   On: 03/22/2021 19:48   CT CHEST W CONTRAST  Result Date: 03/17/2021 CLINICAL DATA:  Infected right chest wall hematoma EXAM: CT CHEST WITH CONTRAST TECHNIQUE: Multidetector CT imaging of the chest was performed during intravenous contrast administration. CONTRAST:  75 mL OMNIPAQUE IOHEXOL 350 MG/ML SOLN COMPARISON:  03/13/2021 FINDINGS: Cardiovascular: Heart size within normal limits. Coronary artery calcifications seen throughout. No significant vascular abnormality identified. Mediastinum/Nodes: No enlarged mediastinal, hilar, or axillary lymph nodes. Lungs/Pleura: Trace bilateral pleural effusions with adjacent atelectasis. Upper Abdomen: No acute abnormality. Musculoskeletal: Previously seen right chest  wall collection has decreased in size since prior examination, measuring approximately 7.4 x 7.1 x 3.7 cm compared to 10.1 x 9.1 x 5.7 cm on prior exam from 03/13/2021. The collection is somewhat bilobed. The inferior portion of the right chest wall collection has decreased to a greater degree than the superior lobulation. Greater amount of air within the collection likely due to presence of drains. Lucency and irregularity of the right sixth and seventh ribs, anterior segment again noted. IMPRESSION: 1. Interval decrease in size of bilobed right anterolateral chest wall fluid collection. The inferior lobulation is nearly completely resolved with some fat stranding and subcutaneous emphysema remaining. The more  superior lobulation has decreased in size since prior study from 03/13/2021. The overall size of the collections now measures 7.4 x 7.1 x 3.7 cm compared to 10.1 x 9.1 x 5.7 cm on 03/13/2021. 2. Lucency in the anterior segments of the right sixth and seventh ribs again seen. Findings are suspicious for osteomyelitis, however underlying pathologic fracture is also possible. Electronically Signed   By: Miachel Roux M.D.   On: 03/17/2021 14:05   CT Chest W Contrast  Result Date: 03/13/2021 CLINICAL DATA:  Follow-up right chest wall hematoma and fractures of the right sixth through eighth ribs. Diabetes. HIV. EXAM: CT CHEST WITH CONTRAST TECHNIQUE: Multidetector CT imaging of the chest was performed during intravenous contrast administration. CONTRAST:  157mL OMNIPAQUE IOHEXOL 300 MG/ML  SOLN COMPARISON:  03/09/2021 FINDINGS: Cardiovascular: Heart size remains normal. No pericardial fluid. Coronary artery calcification as seen previously. Aortic atherosclerotic calcification as seen previously. Mediastinum/Nodes: Normal Lungs/Pleura: Scarring/atelectasis at the lung bases left more than right unchanged since the previous study. Small areas of patchy density in the superior segment of the right lower lobe unchanged from the study of 4 days ago. No new or progressive pulmonary finding. No pneumothorax. Upper Abdomen: No upper abdominal injury or acute finding. Musculoskeletal: There is increasing size of the right chest wall collections, primarily centered around the right sixth and eighth anterolateral ribs. Those ribs show a pattern of irregular destruction that look more like pathologic fractures due to infection rather than traumatic fractures. Air bubbles are now present within the collections. There is mild extrapleural involvement in side of the ribcage, without frank breakthrough into the pleural space. Most of the collections manifest external to the ribcage. No new areas of involvement are seen. IMPRESSION:  Increasing size of the right chest wall collections, primarily centered around the right sixth and eighth anterolateral ribs, with development of air bubbles. Those ribs show a pattern of irregular destruction that have progressed and look more like pathologic fractures due to infection rather than traumatic fractures. Air bubbles are now present within the collections and they probably represent abscesses. There is mild extrapleural involvement on the inner side of the ribcage, without frank breakthrough into the pleural space. The majority of the collections are on the outer side of the ribcage. No new areas of involvement are seen. Case discussed with Dr. Regenia Skeeter at 1419 hours. Aortic Atherosclerosis (ICD10-I70.0). Electronically Signed   By: Nelson Chimes M.D.   On: 03/13/2021 14:19   CT Chest W Contrast  Result Date: 03/09/2021 CLINICAL DATA:  Fall 3 weeks prior with right rib fractures and persistent chest pain. EXAM: CT CHEST WITH CONTRAST TECHNIQUE: Multidetector CT imaging of the chest was performed during intravenous contrast administration. CONTRAST:  36mL OMNIPAQUE IOHEXOL 300 MG/ML  SOLN COMPARISON:  Chest radiograph from earlier today. 11/22/2019 chest CT angiogram. FINDINGS: Cardiovascular: Normal  heart size. No significant pericardial effusion/thickening. Three-vessel coronary atherosclerosis. Atherosclerotic nonaneurysmal thoracic aorta. Top-normal caliber main pulmonary artery (3.0 cm diameter). No central pulmonary emboli. Mediastinum/Nodes: No discrete thyroid nodules. Unremarkable esophagus. No pathologically enlarged axillary, mediastinal or hilar lymph nodes. Lungs/Pleura: No pneumothorax. No pleural effusion. No acute consolidative airspace disease, lung masses or significant pulmonary nodules. Mild platelike atelectasis in the anterior basilar left lower lobe. Mild patchy ground-glass opacity in peripheral posterior right mid lung. Upper abdomen: No acute abnormality. Musculoskeletal: No  aggressive appearing focal osseous lesions. Nondisplaced acute anterior right sixth, seventh and eighth rib fractures with surrounding chest wall hematoma measuring up to 8.1 x 4.9 cm in maximum axial dimensions at the level of the anterior right sixth rib fracture (series 2/image 114). Moderate thoracic spondylosis. IMPRESSION: 1. Nondisplaced acute anterior right sixth, seventh and eighth rib fractures with surrounding chest wall hematoma. No pneumothorax or hemothorax. No discrete bone lesions. Clinical follow-up advised to ensure resolution of the right chest wall hematoma. Any need for follow-up imaging should be based on clinical assessment. 2. Mild patchy ground-glass opacity in the peripheral posterior right mid lung, nonspecific, favor mild pulmonary contusion. 3. Mild platelike atelectasis at the left lung base. 4. Three-vessel coronary atherosclerosis. 5. Aortic Atherosclerosis (ICD10-I70.0). Electronically Signed   By: Ilona Sorrel M.D.   On: 03/09/2021 13:03   IR US Guide Bx Asp/Drain  Result Date: 03/14/2021 INDICATION: 70 year old with history of fall and right rib fractures. Patient developed right chest hematomas. Patient is complaining of pain and recent CT demonstrates gas within the hematomas. Findings are concerning for infected hematomas. EXAM: PLACEMENT OF SUPERFICIAL RIGHT ANTERIOR CHEST WALL DRAIN USING ULTRASOUND GUIDANCE x 2 MEDICATIONS: Moderate sedation ANESTHESIA/SEDATION: Fentanyl 100 mcg IV; Versed 2.0 mg IV Moderate Sedation Time:  29 minutes The patient was continuously monitored during the procedure by the interventional radiology nurse under my direct supervision. COMPLICATIONS: None immediate. PROCEDURE: Informed written consent was obtained from the patient after a thorough discussion of the procedural risks, benefits and alternatives. All questions were addressed. Maximal Sterile Barrier Technique was utilized including caps, mask, sterile gowns, sterile gloves, sterile  drape, hand hygiene and skin antiseptic. A timeout was performed prior to the initiation of the procedure. The right anterior chest wall hematomas were evaluated with ultrasound. Complex collections containing a small amount of fluid were obtained. The right anterior chest was prepped with chlorhexidine and sterile field was created. Attention was initially directed to the more inferior or caudal anterior chest wall collection. Skin was anesthetized with 1% lidocaine. Using ultrasound guidance, an 18 gauge trocar needle was directed into the collection and pink purulent fluid was aspirated. Superstiff Amplatz wire was advanced into the complex collection and the tract was dilated to accommodate a 10 [place] drain. Catheter was sutured to the skin and attached to a suction bulb. A sample of fluid was sent for culture. A second area more cephalad was targeted with ultrasound guidance. Skin was anesthetized with 1% lidocaine. Using ultrasound guidance, an 18 gauge trocar needle was directed into the complex collection. Again, purulent fluid was aspirated. Superstiff Amplatz wire was advanced into this collection and a 10 French drain was placed. Additional purulent fluid was aspirated and the catheter was sutured to skin and attached to a suction bulb. Dressings were placed over both drains. FINDINGS: Patient has palpable hematomas in the right anterior chest and upper abdomen region. Ultrasound demonstrates heterogeneous collections in both areas containing a small amount of compressible fluid. There are echogenic areas  within the collections compatible with known gas. 10 French drains were placed in both areas and purulent fluid was draining from both collections. Both collections are very complex and compatible with infected hematomas. IMPRESSION: Ultrasound-guided placement of 2 percutaneous drains within the superficial right anterior chest wall collections. Findings are compatible with infected hematomas. Fluid  sample was sent for culture. Electronically Signed   By: Markus Daft M.D.   On: 03/14/2021 18:37   IR US Guide Bx Asp/Drain  Result Date: 03/14/2021 INDICATION: 70 year old with history of fall and right rib fractures. Patient developed right chest hematomas. Patient is complaining of pain and recent CT demonstrates gas within the hematomas. Findings are concerning for infected hematomas. EXAM: PLACEMENT OF SUPERFICIAL RIGHT ANTERIOR CHEST WALL DRAIN USING ULTRASOUND GUIDANCE x 2 MEDICATIONS: Moderate sedation ANESTHESIA/SEDATION: Fentanyl 100 mcg IV; Versed 2.0 mg IV Moderate Sedation Time:  29 minutes The patient was continuously monitored during the procedure by the interventional radiology nurse under my direct supervision. COMPLICATIONS: None immediate. PROCEDURE: Informed written consent was obtained from the patient after a thorough discussion of the procedural risks, benefits and alternatives. All questions were addressed. Maximal Sterile Barrier Technique was utilized including caps, mask, sterile gowns, sterile gloves, sterile drape, hand hygiene and skin antiseptic. A timeout was performed prior to the initiation of the procedure. The right anterior chest wall hematomas were evaluated with ultrasound. Complex collections containing a small amount of fluid were obtained. The right anterior chest was prepped with chlorhexidine and sterile field was created. Attention was initially directed to the more inferior or caudal anterior chest wall collection. Skin was anesthetized with 1% lidocaine. Using ultrasound guidance, an 18 gauge trocar needle was directed into the collection and pink purulent fluid was aspirated. Superstiff Amplatz wire was advanced into the complex collection and the tract was dilated to accommodate a 10 [place] drain. Catheter was sutured to the skin and attached to a suction bulb. A sample of fluid was sent for culture. A second area more cephalad was targeted with ultrasound  guidance. Skin was anesthetized with 1% lidocaine. Using ultrasound guidance, an 18 gauge trocar needle was directed into the complex collection. Again, purulent fluid was aspirated. Superstiff Amplatz wire was advanced into this collection and a 10 French drain was placed. Additional purulent fluid was aspirated and the catheter was sutured to skin and attached to a suction bulb. Dressings were placed over both drains. FINDINGS: Patient has palpable hematomas in the right anterior chest and upper abdomen region. Ultrasound demonstrates heterogeneous collections in both areas containing a small amount of compressible fluid. There are echogenic areas within the collections compatible with known gas. 10 French drains were placed in both areas and purulent fluid was draining from both collections. Both collections are very complex and compatible with infected hematomas. IMPRESSION: Ultrasound-guided placement of 2 percutaneous drains within the superficial right anterior chest wall collections. Findings are compatible with infected hematomas. Fluid sample was sent for culture. Electronically Signed   By: Markus Daft M.D.   On: 03/14/2021 18:37   DG Chest Port 1 View  Result Date: 03/13/2021 CLINICAL DATA:  Sepsis, right rib pain. EXAM: PORTABLE CHEST 1 VIEW COMPARISON:  March 09, 2021. FINDINGS: The heart size and mediastinal contours are within normal limits. Both lungs are clear. No pneumothorax or pleural effusion is noted. The visualized skeletal structures are unremarkable. IMPRESSION: No active disease. Electronically Signed   By: Marijo Conception M.D.   On: 03/13/2021 14:11   DG Chest Portable  1 View  Result Date: 03/09/2021 CLINICAL DATA:  Chest pain. Chest Arctic hurting late yesterday. Complains of right-sided sternal pain and chest pain. EXAM: PORTABLE CHEST 1 VIEW COMPARISON:  None. FINDINGS: The heart size and mediastinal contours are within normal limits. Both lungs are clear. Blunting of the left  costophrenic angle is noted, new from prior studies. The visualized skeletal structures are unremarkable. IMPRESSION: New blunting of left costophrenic angle may reflect small effusion. The lungs are otherwise clear. No signs of interstitial edema. Electronically Signed   By: Kerby Moors M.D.   On: 03/09/2021 11:26   [place] IMAGE GUIDED DRAINAGE BY PERCUTANEOUS CATHETER  Result Date: 03/18/2021 INDICATION: History of right-sided rib fractures complicated by development of infected adjacent hematomas, post ultrasound-guided placement of two percutaneous drainage catheters on 03/14/2021. Unfortunately, one of the percutaneous drainage catheters was inadvertently removed with subsequent chest CT performed 03/17/2021 demonstrating a persistent undrained complex fluid collection involving the anterior aspect of the right chest wall. As such, request made for ultrasound-guided aspiration and/or drainage catheter placement for infection source control purposes. EXAM: 1. ULTRASOUND-GUIDED RIGHT CHEST WALL PERCUTANEOUS DRAINAGE CATHETER PLACEMENT X2 2. ULTRASOUND-GUIDED RIGHT CHEST WALL ABSCESS ASPIRATION COMPARISON:  Chest CT-03/17/2021; 03/13/2021; ultrasound-guided right chest wall percutaneous drainage catheter placement x2-03/14/2021 MEDICATIONS: The patient is currently admitted to the hospital and receiving intravenous antibiotics. The antibiotics were administered within an appropriate time frame prior to the initiation of the procedure. ANESTHESIA/SEDATION: Moderate (conscious) sedation was employed during this procedure. A total of Versed 4 mg and Fentanyl 200 mcg was administered intravenously. Moderate Sedation Time: 41 minutes. The patient's level of consciousness and vital signs were monitored continuously by radiology nursing throughout the procedure under my direct supervision. CONTRAST:  None COMPLICATIONS: None immediate. PROCEDURE: Informed written consent was obtained from the patient after a  discussion of the risks, benefits and alternatives to treatment. Preprocedural ultrasound scanning demonstrated complex fluid involving the anterior aspect of the right chest wall compatible with the findings seen on preceding chest CT. A timeout was performed prior to the initiation of the procedure. The skin overlying the right anterior chest was prepped and draped in the usual sterile fashion. The overlying soft tissues were anesthetized with 1% lidocaine with epinephrine. Under direct ultrasound guidance, a 18 gauge trocar needle was advanced into the complex abscess within the right chest wall, inferior to the nipple. A short Amplatz wire was coiled within the collection. The track was dilated ultimately allowing placement of a 10 French percutaneous catheter. Multiple ultrasound images were saved procedural documentation purposes. Next, approximately 10 cc of purulent fluid was aspirated from the drainage catheter Sonographic evaluation demonstrates a residual complex fluid collection and as such the more medial component of the collection was accessed with an 18 gauge trocar needle. A short Amplatz wire was coiled within the collection and an additional 10 French percutaneous drainage catheter was placed under ultrasound guidance. Multiple ultrasound images were saved procedural documentation purposes. Next, approximately 30 cc of purulent fluid was aspirated from this more medially positioned chest wall drain. A representative sample was capped and sent to the laboratory for analysis. Next, the lateral component of the right chest wall collection was accessed with an 18 gauge trocar needle however only a small amount of bloody fluid was able to be aspirated. As such, a short Amplatz wire was coiled within the collection and the trocar needle was exchanged for a Yueh sheath catheter which was utilized to aspirate approximately 3 cc of thick bloody fluid.  At this time, approximately 70 cc of additional  blood-tinged purulent fluid was able to be expressed from the site of the previously removed percutaneous drainage catheter. Drainage catheters were secured at the entrance site within interrupted sutures and drainage catheters were connected to JP bulbs. Dressings were applied. The patient tolerated the procedure well without immediate postprocedural complication. IMPRESSION: 1. Successful ultrasound-guided placement of 2 two additional right anterior chest wall percutaneous drainage catheters yielding a total of 40 cc of purulent fluid. A representative aspirated sample was sent to the laboratory for analysis. 2. Successful ultrasound-guided aspiration of approximately 3 cc of thick bloody fluid from the more lateral component of the right anterior chest wall complex hematoma. 3. Successful bedside expression of approximately 70 cc of purulent fluid from the entrance site of the recently removed percutaneous drainage catheter. Electronically Signed   By: Sandi Mariscal M.D.   On: 03/18/2021 15:29   [place] IMAGE GUIDED DRAINAGE BY PERCUTANEOUS CATHETER  Result Date: 03/18/2021 INDICATION: History of right-sided rib fractures complicated by development of infected adjacent hematomas, post ultrasound-guided placement of two percutaneous drainage catheters on 03/14/2021. Unfortunately, one of the percutaneous drainage catheters was inadvertently removed with subsequent chest CT performed 03/17/2021 demonstrating a persistent undrained complex fluid collection involving the anterior aspect of the right chest wall. As such, request made for ultrasound-guided aspiration and/or drainage catheter placement for infection source control purposes. EXAM: 1. ULTRASOUND-GUIDED RIGHT CHEST WALL PERCUTANEOUS DRAINAGE CATHETER PLACEMENT X2 2. ULTRASOUND-GUIDED RIGHT CHEST WALL ABSCESS ASPIRATION COMPARISON:  Chest CT-03/17/2021; 03/13/2021; ultrasound-guided right chest wall percutaneous drainage catheter placement x2-03/14/2021  MEDICATIONS: The patient is currently admitted to the hospital and receiving intravenous antibiotics. The antibiotics were administered within an appropriate time frame prior to the initiation of the procedure. ANESTHESIA/SEDATION: Moderate (conscious) sedation was employed during this procedure. A total of Versed 4 mg and Fentanyl 200 mcg was administered intravenously. Moderate Sedation Time: 41 minutes. The patient's level of consciousness and vital signs were monitored continuously by radiology nursing throughout the procedure under my direct supervision. CONTRAST:  None COMPLICATIONS: None immediate. PROCEDURE: Informed written consent was obtained from the patient after a discussion of the risks, benefits and alternatives to treatment. Preprocedural ultrasound scanning demonstrated complex fluid involving the anterior aspect of the right chest wall compatible with the findings seen on preceding chest CT. A timeout was performed prior to the initiation of the procedure. The skin overlying the right anterior chest was prepped and draped in the usual sterile fashion. The overlying soft tissues were anesthetized with 1% lidocaine with epinephrine. Under direct ultrasound guidance, a 18 gauge trocar needle was advanced into the complex abscess within the right chest wall, inferior to the nipple. A short Amplatz wire was coiled within the collection. The track was dilated ultimately allowing placement of a 10 French percutaneous catheter. Multiple ultrasound images were saved procedural documentation purposes. Next, approximately 10 cc of purulent fluid was aspirated from the drainage catheter Sonographic evaluation demonstrates a residual complex fluid collection and as such the more medial component of the collection was accessed with an 18 gauge trocar needle. A short Amplatz wire was coiled within the collection and an additional 10 French percutaneous drainage catheter was placed under ultrasound guidance.  Multiple ultrasound images were saved procedural documentation purposes. Next, approximately 30 cc of purulent fluid was aspirated from this more medially positioned chest wall drain. A representative sample was capped and sent to the laboratory for analysis. Next, the lateral component of the right chest wall  collection was accessed with an 18 gauge trocar needle however only a small amount of bloody fluid was able to be aspirated. As such, a short Amplatz wire was coiled within the collection and the trocar needle was exchanged for a Yueh sheath catheter which was utilized to aspirate approximately 3 cc of thick bloody fluid. At this time, approximately 70 cc of additional blood-tinged purulent fluid was able to be expressed from the site of the previously removed percutaneous drainage catheter. Drainage catheters were secured at the entrance site within interrupted sutures and drainage catheters were connected to JP bulbs. Dressings were applied. The patient tolerated the procedure well without immediate postprocedural complication. IMPRESSION: 1. Successful ultrasound-guided placement of 2 two additional right anterior chest wall percutaneous drainage catheters yielding a total of 40 cc of purulent fluid. A representative aspirated sample was sent to the laboratory for analysis. 2. Successful ultrasound-guided aspiration of approximately 3 cc of thick bloody fluid from the more lateral component of the right anterior chest wall complex hematoma. 3. Successful bedside expression of approximately 70 cc of purulent fluid from the entrance site of the recently removed percutaneous drainage catheter. Electronically Signed   By: Sandi Mariscal M.D.   On: 03/18/2021 15:29   [place] FINE NEEDLE ASP 1ST LESION  Result Date: 03/18/2021 INDICATION: History of right-sided rib fractures complicated by development of infected adjacent hematomas, post ultrasound-guided placement of two percutaneous drainage catheters on  03/14/2021. Unfortunately, one of the percutaneous drainage catheters was inadvertently removed with subsequent chest CT performed 03/17/2021 demonstrating a persistent undrained complex fluid collection involving the anterior aspect of the right chest wall. As such, request made for ultrasound-guided aspiration and/or drainage catheter placement for infection source control purposes. EXAM: 1. ULTRASOUND-GUIDED RIGHT CHEST WALL PERCUTANEOUS DRAINAGE CATHETER PLACEMENT X2 2. ULTRASOUND-GUIDED RIGHT CHEST WALL ABSCESS ASPIRATION COMPARISON:  Chest CT-03/17/2021; 03/13/2021; ultrasound-guided right chest wall percutaneous drainage catheter placement x2-03/14/2021 MEDICATIONS: The patient is currently admitted to the hospital and receiving intravenous antibiotics. The antibiotics were administered within an appropriate time frame prior to the initiation of the procedure. ANESTHESIA/SEDATION: Moderate (conscious) sedation was employed during this procedure. A total of Versed 4 mg and Fentanyl 200 mcg was administered intravenously. Moderate Sedation Time: 41 minutes. The patient's level of consciousness and vital signs were monitored continuously by radiology nursing throughout the procedure under my direct supervision. CONTRAST:  None COMPLICATIONS: None immediate. PROCEDURE: Informed written consent was obtained from the patient after a discussion of the risks, benefits and alternatives to treatment. Preprocedural ultrasound scanning demonstrated complex fluid involving the anterior aspect of the right chest wall compatible with the findings seen on preceding chest CT. A timeout was performed prior to the initiation of the procedure. The skin overlying the right anterior chest was prepped and draped in the usual sterile fashion. The overlying soft tissues were anesthetized with 1% lidocaine with epinephrine. Under direct ultrasound guidance, a 18 gauge trocar needle was advanced into the complex abscess within the  right chest wall, inferior to the nipple. A short Amplatz wire was coiled within the collection. The track was dilated ultimately allowing placement of a 10 French percutaneous catheter. Multiple ultrasound images were saved procedural documentation purposes. Next, approximately 10 cc of purulent fluid was aspirated from the drainage catheter Sonographic evaluation demonstrates a residual complex fluid collection and as such the more medial component of the collection was accessed with an 18 gauge trocar needle. A short Amplatz wire was coiled within the collection and an additional 10 [place]  percutaneous drainage catheter was placed under ultrasound guidance. Multiple ultrasound images were saved procedural documentation purposes. Next, approximately 30 cc of purulent fluid was aspirated from this more medially positioned chest wall drain. A representative sample was capped and sent to the laboratory for analysis. Next, the lateral component of the right chest wall collection was accessed with an 18 gauge trocar needle however only a small amount of bloody fluid was able to be aspirated. As such, a short Amplatz wire was coiled within the collection and the trocar needle was exchanged for a Yueh sheath catheter which was utilized to aspirate approximately 3 cc of thick bloody fluid. At this time, approximately 70 cc of additional blood-tinged purulent fluid was able to be expressed from the site of the previously removed percutaneous drainage catheter. Drainage catheters were secured at the entrance site within interrupted sutures and drainage catheters were connected to JP bulbs. Dressings were applied. The patient tolerated the procedure well without immediate postprocedural complication. IMPRESSION: 1. Successful ultrasound-guided placement of 2 two additional right anterior chest wall percutaneous drainage catheters yielding a total of 40 cc of purulent fluid. A representative aspirated sample was sent to the  laboratory for analysis. 2. Successful ultrasound-guided aspiration of approximately 3 cc of thick bloody fluid from the more lateral component of the right anterior chest wall complex hematoma. 3. Successful bedside expression of approximately 70 cc of purulent fluid from the entrance site of the recently removed percutaneous drainage catheter. Electronically Signed   By: Sandi Mariscal M.D.   On: 03/18/2021 15:29

## 2021-03-26 ENCOUNTER — Encounter (HOSPITAL_COMMUNITY): Payer: Self-pay | Admitting: Thoracic Surgery (Cardiothoracic Vascular Surgery)

## 2021-03-26 DIAGNOSIS — I10 Essential (primary) hypertension: Secondary | ICD-10-CM | POA: Diagnosis not present

## 2021-03-26 DIAGNOSIS — M8610 Other acute osteomyelitis, unspecified site: Secondary | ICD-10-CM

## 2021-03-26 DIAGNOSIS — L02213 Cutaneous abscess of chest wall: Secondary | ICD-10-CM | POA: Diagnosis not present

## 2021-03-26 LAB — CBC WITH DIFFERENTIAL/PLATELET
Abs Immature Granulocytes: 0.02 10*3/uL (ref 0.00–0.07)
Basophils Absolute: 0 10*3/uL (ref 0.0–0.1)
Basophils Relative: 0 %
Eosinophils Absolute: 0.2 10*3/uL (ref 0.0–0.5)
Eosinophils Relative: 3 %
HCT: 29.4 % — ABNORMAL LOW (ref 39.0–52.0)
Hemoglobin: 9.5 g/dL — ABNORMAL LOW (ref 13.0–17.0)
Immature Granulocytes: 0 %
Lymphocytes Relative: 27 %
Lymphs Abs: 1.4 10*3/uL (ref 0.7–4.0)
MCH: 32.1 pg (ref 26.0–34.0)
MCHC: 32.3 g/dL (ref 30.0–36.0)
MCV: 99.3 fL (ref 80.0–100.0)
Monocytes Absolute: 0.6 10*3/uL (ref 0.1–1.0)
Monocytes Relative: 12 %
Neutro Abs: 3.1 10*3/uL (ref 1.7–7.7)
Neutrophils Relative %: 58 %
Platelets: 332 10*3/uL (ref 150–400)
RBC: 2.96 MIL/uL — ABNORMAL LOW (ref 4.22–5.81)
RDW: 13.4 % (ref 11.5–15.5)
WBC: 5.4 10*3/uL (ref 4.0–10.5)
nRBC: 0 % (ref 0.0–0.2)

## 2021-03-26 LAB — COMPREHENSIVE METABOLIC PANEL
ALT: 10 U/L (ref 0–44)
AST: 16 U/L (ref 15–41)
Albumin: 2.3 g/dL — ABNORMAL LOW (ref 3.5–5.0)
Alkaline Phosphatase: 81 U/L (ref 38–126)
Anion gap: 6 (ref 5–15)
BUN: 23 mg/dL (ref 8–23)
CO2: 32 mmol/L (ref 22–32)
Calcium: 8.7 mg/dL — ABNORMAL LOW (ref 8.9–10.3)
Chloride: 99 mmol/L (ref 98–111)
Creatinine, Ser: 1.28 mg/dL — ABNORMAL HIGH (ref 0.61–1.24)
GFR, Estimated: >60 mL/min (ref >60)
Glucose, Bld: 207 mg/dL — ABNORMAL HIGH (ref 70–99)
Potassium: 4.7 mmol/L (ref 3.5–5.1)
Sodium: 137 mmol/L (ref 135–145)
Total Bilirubin: 0.6 mg/dL (ref 0.3–1.2)
Total Protein: 5.9 g/dL — ABNORMAL LOW (ref 6.5–8.1)

## 2021-03-26 LAB — BRAIN NATRIURETIC PEPTIDE: B Natriuretic Peptide: 40.5 pg/mL (ref 0.0–100.0)

## 2021-03-26 LAB — MAGNESIUM: Magnesium: 1.9 mg/dL (ref 1.7–2.4)

## 2021-03-26 LAB — GLUCOSE, CAPILLARY
Glucose-Capillary: 167 mg/dL — ABNORMAL HIGH (ref 70–99)
Glucose-Capillary: 147 mg/dL — ABNORMAL HIGH (ref 70–99)
Glucose-Capillary: 147 mg/dL — ABNORMAL HIGH (ref 70–99)

## 2021-03-26 LAB — HIV-1 RNA, PCR (GRAPH) RFX/GENO EDI
HIV-1 RNA BY PCR: <20 copies/mL
HIV-1 RNA Quant, Log: UNDETERMINED log10copy/mL

## 2021-03-26 LAB — C-REACTIVE PROTEIN: CRP: 2.5 mg/dL — ABNORMAL HIGH (ref <1.0)

## 2021-03-26 NOTE — Progress Notes (Addendum)
      [address]       [place] [address]             [phone]       1 Day Post-Op Procedure(s) (LRB): APPLICATION OF WOUND VAC (Right) INCISION AND DRAINAGE OF CHEST WALL (Right) 7 Day I and D right chest wall abscess  Subjective: Patient has pain at right chest (wound VAC)  Objective: Vital signs in last 24 hours: Temp:  [98.4 F (36.9 C)] 98.4 F (36.9 C) (04/07 0406) Pulse Rate:  [73-81] 80 (04/07 0406) Cardiac Rhythm: Normal sinus rhythm (04/06 1330) Resp:  [11-20] 20 (04/07 0406) BP: (105-143)/(73-98) 105/85 (04/07 0406) SpO2:  [98 %-100 %] 98 % (04/07 0406) Weight:  [85.2 kg] 85.2 kg (04/06 0944)     Intake/Output from previous day: 04/06 0701 - 04/07 0700 In: 1429.4 [P.O.:240; I.V.:933.4; IV Piggyback:256] Out: 3244 [Urine:1550; Drains:100; Blood:20]   Physical Exam:  Cardiovascular: RRR. Wounds: VAC in place and functioning properly   Lab Results: CBC: Recent Labs    03/25/21 0032 03/26/21 0329  WBC 6.5 5.4  HGB 10.2* 9.5*  HCT 31.4* 29.4*  PLT 364 332   BMET:  Recent Labs    03/25/21 0032 03/26/21 0329  NA 137 137  K 4.5 4.7  CL 97* 99  CO2 33* 32  GLUCOSE 173* 207*  BUN 29* 23  CREATININE 1.36* 1.28*  CALCIUM 8.9 8.7*    PT/INR: No results for input(s): LABPROT, INR in the last 72 hours. ABG:  INR: Will add last result for INR, ABG once components are confirmed Will add last 4 CBG results once components are confirmed  Assessment/Plan:  1. CV - SR. On Lopressor 25 mg bid 2.  Pulmonary - On room air. Encourage incentive spirometer. 3. DM-CBGs 104/145/163. On Insulin. Per primary 4. History of HIV-on Mavyret and Triumeq 5. 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 Right chest wall wound VAC functioning properly 6. History of chronic pain, narcotic/IVDU abuse, and Hepatitis C 7. Management per primary  Donielle M ZimmermanPA-C 03/26/2021,7:31 AM [phone]  Agree with above. Wound VAC change yesterday.  Good  healing evident. Clear for discharge from surgical standpoint.  Will need home health for wound VAC changes. Can follow-up with CT surgery in 2 weeks after discharge.  Kortlyn Koltz Bary Leriche
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
## 2021-03-26 NOTE — Addendum Note (Signed)
Addendum  created 03/26/21 1051 by Bufford Spikes, CRNA   Attestation recorded in [place], [place] filed, Dance movement psychotherapist edited

## 2021-03-26 NOTE — Progress Notes (Addendum)
PROGRESS NOTE                                                                                                                                                                                                             Patient Demographics:    Brian Malone, is a 70 y.o. male, DOB - 1950/12/22, BSW:[id number]  Outpatient Primary MD for the patient is [name], [name], MD    LOS - 13  Admit date - 03/13/2021    Chief Complaint  Patient presents with  . Rib Injury       Brief Narrative (HPI from H&P)  70 year old male with past medical history of hepatitis C, hypertension, hyperlipidemia, diabetes mellitus type 2, chronic pain syndrome, intravenous drug use (abstinent since 2019), major depressive disorder and HIV who had a trip and fall in his yard on 02/22/2021 after which he hurt his right-sided chest wall, at that time he was diagnosed with right-sided rib cage fracture and injury, he was seen in the ER a few times since then and was treated conservatively, his pain and discomfort continued to get worse and he presented again to [facility] on  03/13/2021 CT scan suggested that he had right chest wall abscess formation and cellulitis with concerns for sepsis.   Subjective:   Patient denies any fever, chills, shortness of breath, reports pain is controlled on current regimen.     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  Now abscess considerably improved.  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 - ID consulted and following.  he is on Daptomycin since 03/23/2021 previously was on Vancomycin, antibiotics management per ID - Cardiothoracic surgery took patient back to the OR 4/6, for further chest wall debridement, and wound VAC change.  - Encouraged the patient to sit up in chair in the daytime use I-S and flutter valve for pulmonary toiletry.  Will advance activity and titrate down oxygen as  possible.  Not a good candidate for PICC line and home IV antibiotics history of IV drug use in the past.   Chronic pain and narcotic use.  Supportive care as needed for acute discomfort, avoid overuse, as needed Narcan added.  Patient counseled see above  HIV - Continue home regimen follows with [facility] ID department.  Anxiety and depression.  Home medications continued.  Currently not homicidal or suicidal.  GERD.  On PPI.  BPH.  On Flomax.  AKI.  Due to ATN from sepsis, resolved after hydration, gentle hydration again on 03/24/2021.  Hypertension.  Blood pressure soft, blood pressure medicine has been discontinued.  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  On sliding scale monitor and adjust  Lab Results  Component Value Date   HGBA1C 8.3 (H) 03/14/2021   CBG (last 3)  Recent Labs    03/25/21 1707 03/25/21 2031 03/26/21 0736  GLUCAP 145* 163* 167*          Condition - Extremely Guarded  Family Communication  : None present bedside  Code Status :  Full  Consults  :  IR, cardiothoracic surgery, ID  PUD Prophylaxis : PPI   Procedures  :     Right chest wall I&D by Dr. Kipp Brood 1/60/10 -Repeat application of wound VAC and incision and drainage on 4/6  US guided chest wall abscess drined by IR 03/14/21   CT -  Increasing size of the right chest wall collections, primarily centered around the right sixth and eighth anterolateral ribs, with development of air bubbles. Those ribs show a pattern of irregular destruction that have  progressed and look more like pathologic fractures due to infection rather than traumatic fractures. Air bubbles are now present within the collections and they probably represent abscesses. There is mild extrapleural involvement on the inner side of the ribcage, without frank breakthrough into the pleural space. The majority of the collections are on the outer side of the ribcage. No new areas of involvement are seen.  CT repeat 03/17/21 - 1. Interval decrease in size of bilobed right anterolateral chest wall fluid collection. The inferior lobulation is nearly completely resolved with some fat stranding and subcutaneous emphysema remaining. The more superior lobulation has decreased in size since prior study from 03/13/2021. The overall size of the collections now measures 7.4 x 7.1 x 3.7 cm compared to 10.1 x 9.1 x 5.7 cm on 03/13/2021. 2. Lucency in the anterior segments of the right sixth and seventh ribs again seen. Findings are suspicious for osteomyelitis, however underlying pathologic fracture is also possible.      Disposition Plan  :    Status is: Inpatient  Remains inpatient appropriate because:IV treatments appropriate due to intensity of illness or inability to take PO   Dispo: The patient is from: Home              Anticipated d/c is to: Home              Patient currently is not medically stable to d/c.   Difficult to place patient No  DVT Prophylaxis  :   Heparin   Lab Results  Component Value Date   PLT 332 03/26/2021    Diet :  Diet Order            Diet Carb Modified Fluid consistency: Thin; Room service appropriate? Yes  Diet effective now  Inpatient Medications  Scheduled Meds: . (feeding supplement) PROSource Plus  30 mL Oral TID WC  . abacavir-dolutegravir-lamiVUDine  1 tablet Oral Daily  . busPIRone  30 mg Oral BID  . clonazepam  0.5 mg Oral TID  . ezetimibe  10 mg Oral Daily  . feeding supplement  237 mL Oral BID BM  . feeding  supplement (GLUCERNA SHAKE)  237 mL Oral Q24H  . Glecaprevir-Pibrentasvir  3 tablet Oral Daily  . heparin injection (subcutaneous)  5,000 Units Subcutaneous Q8H  . insulin aspart  0-15 Units Subcutaneous TID AC & HS  . metoprolol tartrate  25 mg Oral BID  . multivitamin with minerals  1 tablet Oral Daily  . pantoprazole  40 mg Oral QAC breakfast  . sodium chloride flush  5 mL Intracatheter Q8H  . tamsulosin  0.4 mg Oral QHS  . tiZANidine  2 mg Oral QHS   Continuous Infusions: . sodium chloride 50 mL/hr at 03/24/21 2342  . DAPTOmycin (CUBICIN)  IV Stopped (03/26/21 0117)  . lactated ringers 10 mL/hr at 03/25/21 1007   PRN Meds:.sodium chloride, acetaminophen **OR** [DISCONTINUED] acetaminophen, antiseptic oral rinse, HYDROcodone-acetaminophen, HYDROmorphone (DILAUDID) injection **OR** [DISCONTINUED]  HYDROmorphone (DILAUDID) injection, naLOXone (NARCAN)  injection, [DISCONTINUED] ondansetron **OR** ondansetron (ZOFRAN) IV, polyethylene glycol, valACYclovir  Antibiotics  :    Anti-infectives (From admission, onward)   Start     Dose/Rate Route Frequency Ordered Stop   03/23/21 2000  DAPTOmycin (CUBICIN) 700 mg in sodium chloride 0.9 % IVPB        8 mg/kg  85.2 kg 128 mL/hr over 30 Minutes Intravenous Every 24 hours 03/23/21 1133     03/22/21 1200  vancomycin (VANCOREADY) IVPB 1250 mg/250 mL  Status:  Discontinued        1,250 mg 166.7 mL/hr over 90 Minutes Intravenous Every 24 hours 03/21/21 1319 03/23/21 1133   03/20/21 1000  Glecaprevir-Pibrentasivir 100-40 mg (Mavyret) tabs 3 tablets - patient's own supply        3 tablet Oral Daily 03/20/21 0733     03/15/21 1200  vancomycin (VANCOREADY) IVPB 1500 mg/300 mL  Status:  Discontinued        1,500 mg 150 mL/hr over 120 Minutes Intravenous Every 24 hours 03/15/21 0732 03/21/21 1319   03/14/21 1200  vancomycin (VANCOREADY) IVPB 1250 mg/250 mL  Status:  Discontinued        1,250 mg 166.7 mL/hr over 90 Minutes Intravenous Every 24 hours  03/13/21 1359 03/15/21 0732   03/14/21 1023  valACYclovir (VALTREX) tablet 1,000 mg       Note to Pharmacy: Trig neuralgia flare     1,000 mg Oral 3 times daily PRN 03/14/21 1023     03/14/21 1000  abacavir-dolutegravir-lamiVUDine (TRIUMEQ) 600-50-300 MG per tablet 1 tablet        1 tablet Oral Daily 03/13/21 2320     03/14/21 0200  meropenem (MERREM) 1 g in sodium chloride 0.9 % 100 mL IVPB  Status:  Discontinued        1 g 200 mL/hr over 30 Minutes Intravenous Every 8 hours 03/13/21 2337 03/16/21 0719   03/14/21 0100  ceFEPIme (MAXIPIME) 2 g in sodium chloride 0.9 % 100 mL IVPB  Status:  Discontinued       Note to Pharmacy: Cefepime 2 g IV q12h for CrCl < 60 mL/min   2 g 200 mL/hr over 30 Minutes Intravenous Every 12 hours 03/13/21 2326 03/13/21 2331   03/13/21 2000  piperacillin-tazobactam (ZOSYN) IVPB 3.375 g  Status:  Discontinued        3.375 g 12.5 mL/hr over 240 Minutes Intravenous Every 8 hours 03/13/21 1402 03/13/21 2320   03/13/21 1430  clindamycin (CLEOCIN) IVPB 900 mg        900 mg 100 mL/hr over 30 Minutes Intravenous  Once 03/13/21 1418 03/13/21 1548   03/13/21 1230  vancomycin (VANCOCIN) IVPB 1000 mg/200 mL premix        1,000 mg 200 mL/hr over 60 Minutes Intravenous  Once 03/13/21 1229 03/13/21 1506   03/13/21 1230  piperacillin-tazobactam (ZOSYN) IVPB 3.375 g        3.375 g 100 mL/hr over 30 Minutes Intravenous  Once 03/13/21 1229 03/13/21 1400       Giuliana Handyside M.D on 03/26/2021 at 11:45 AM  To page go to [email or web address]   [facility] -  Office  [phone]    See all Orders from today for further details    Objective:   Vitals:   03/25/21 1330 03/25/21 1355 03/25/21 2033 03/26/21 0406  BP: (!) 142/79 (!) 141/73 (!) 143/86 105/85  Pulse:  81 81 80  Resp: 14 18 19 20   Temp: 98.4 F (36.9 C) 98.4 F (36.9 C) 98.4 F (36.9 C) 98.4 F (36.9 C)  TempSrc:  Axillary Axillary Axillary  SpO2: 100% 100% 98% 98%  Weight:      Height:        Wt  Readings from Last 3 Encounters:  03/25/21 85.2 kg  03/09/21 77.1 kg  01/22/21 72.6 kg     Intake/Output Summary (Last 24 hours) at 03/26/2021 1145 Last data filed at 03/26/2021 0453 Gross per 24 hour  Intake 1429.36 ml  Output 1670 ml  Net -240.64 ml   Physical Exam  Awake Alert, Oriented X 3, No new F.N deficits, Normal affect Symmetrical Chest wall movement, Good air movement bilaterally, right lung base rhonchi, wound VAC in place. RRR,No Gallops,Rubs or new Murmurs, No Parasternal Heave +ve B.Sounds, Abd Soft, No tenderness, No rebound - guarding or rigidity. No Cyanosis, Clubbing or edema, No new Rash or bruise      Data Review:    CBC Recent Labs  Lab 03/21/21 0252 03/23/21 0203 03/24/21 0542 03/25/21 0032 03/26/21 0329  WBC 8.8 5.7 8.0 6.5 5.4  HGB 10.1* 10.3* 10.2* 10.2* 9.5*  HCT 29.8* 30.9* 31.2* 31.4* 29.4*  PLT 289 314 391 364 332  MCV 96.4 98.1 100.0 99.4 99.3  MCH 32.7 32.7 32.7 32.3 32.1  MCHC 33.9 33.3 32.7 32.5 32.3  RDW 12.9 13.2 13.3 13.3 13.4  LYMPHSABS 1.7 1.5 1.7 1.5 1.4  MONOABS 0.8 0.6 0.7 0.7 0.6  EOSABS 0.1 0.1 0.3 0.2 0.2  BASOSABS 0.0 0.0 0.1 0.0 0.0    Recent Labs  Lab 03/20/21 0036 03/21/21 0252 03/23/21 0203 03/24/21 0542 03/25/21 0032 03/26/21 0329  NA 132* 132* 134* 136 137 137  K 5.2* 4.3 4.5 4.8 4.5 4.7  CL 97* 97* 97* 97* 97* 99  CO2 25 27 30  34* 33* 32  GLUCOSE 266* 111* 127* 130* 173* 207*  BUN 16 23 23  28* 29* 23  CREATININE 1.27* 1.21 1.20 1.29* 1.36* 1.28*  CALCIUM 8.3* 8.3* 8.7* 9.1 8.9 8.7*  AST 13* 14* 15 15 13* 16  ALT 11 9 9 10 10 10   ALKPHOS 87 81 80 88 82 81  BILITOT 0.7 0.6 0.6 0.6 0.5 0.6  ALBUMIN 1.9* 2.1* 2.3* 2.4* 2.4* 2.3*  MG 1.7 2.0 2.0 2.0 1.9 1.9  CRP  --   --  2.7* 2.6* 2.5* 2.5*  PROCALCITON 0.12 0.10  --   --   --   --   BNP  --   --  84.1 37.7 27.3 40.5    ------------------------------------------------------------------------------------------------------------------ No results for  input(s): CHOL, HDL, LDLCALC, TRIG, CHOLHDL, LDLDIRECT in the last 72 hours.  Lab Results  Component Value Date   HGBA1C 8.3 (H) 03/14/2021   ------------------------------------------------------------------------------------------------------------------ No results for input(s): TSH, T4TOTAL, T3FREE, THYROIDAB in the last 72 hours.  Invalid input(s): FREET3  Cardiac Enzymes No results for input(s): CKMB, TROPONINI, MYOGLOBIN in the last 168 hours.  Invalid input(s): CK ------------------------------------------------------------------------------------------------------------------    Component Value Date/Time   BNP 40.5 03/26/2021 0329    Micro Results Recent Results (from the past 240 hour(s))  Aerobic/Anaerobic Culture w Gram Stain (surgical/deep wound)     Status: None   Collection Time: 03/18/21  1:38 PM   Specimen: Abscess  Result Value Ref Range Status   Specimen Description ABSCESS  Final   Special Requests DRAIN CHEST WALL  Final   Gram Stain   Final    ABUNDANT WBC PRESENT, PREDOMINANTLY PMN ABUNDANT GRAM POSITIVE COCCI IN CLUSTERS    Culture   Final    ABUNDANT METHICILLIN RESISTANT STAPHYLOCOCCUS AUREUS NO ANAEROBES ISOLATED Performed at [facility], [address], [place], [place] [address]    Report Status 03/24/2021 FINAL  Final   Organism ID, Bacteria METHICILLIN RESISTANT STAPHYLOCOCCUS AUREUS  Final      Susceptibility   Methicillin resistant staphylococcus aureus - MIC*    CIPROFLOXACIN >=8 RESISTANT Resistant     ERYTHROMYCIN >=8 RESISTANT Resistant     GENTAMICIN <=0.5 SENSITIVE Sensitive     OXACILLIN >=4 RESISTANT Resistant     TETRACYCLINE <=1 SENSITIVE Sensitive     VANCOMYCIN <=0.5 SENSITIVE Sensitive     TRIMETH/SULFA >=320 RESISTANT Resistant     CLINDAMYCIN >=8 RESISTANT Resistant     RIFAMPIN <=0.5 SENSITIVE Sensitive     Inducible Clindamycin NEGATIVE Sensitive     * ABUNDANT METHICILLIN RESISTANT STAPHYLOCOCCUS AUREUS   Fungus Culture With Stain     Status: None (Preliminary result)   Collection Time: 03/19/21  3:07 PM   Specimen: Wound; Abscess  Result Value Ref Range Status   Fungus Stain Final report  Final    Comment: (NOTE) Performed At: [facility] [place], [place] [id number] Rush Farmer MD TZ:[id number]    Fungus (Mycology) Culture PENDING  Incomplete   Fungal Source ABSCESS  Final    Comment: RIGHT CHEST Performed at [facility], [place] [address] [place], [place] [address]   Aerobic/Anaerobic Culture w Gram Stain (surgical/deep wound)     Status: None   Collection Time: 03/19/21  3:07 PM   Specimen: Wound; Abscess  Result Value Ref Range Status   Specimen Description ABSCESS RIGHT CHEST  Final   Special Requests NONE  Final   Gram Stain   Final    FEW WBC PRESENT, PREDOMINANTLY PMN FEW GRAM POSITIVE COCCI IN PAIRS IN CLUSTERS    Culture   Final    FEW METHICILLIN RESISTANT STAPHYLOCOCCUS AUREUS NO ANAEROBES ISOLATED Performed at [facility], [place] [address], [place], [place] [address]    Report Status 03/24/2021 FINAL  Final   Organism ID, Bacteria METHICILLIN RESISTANT STAPHYLOCOCCUS AUREUS  Final      Susceptibility   Methicillin resistant staphylococcus aureus - MIC*    CIPROFLOXACIN >=8 RESISTANT Resistant     ERYTHROMYCIN >=8 RESISTANT  Resistant     GENTAMICIN <=0.5 SENSITIVE Sensitive     OXACILLIN >=4 RESISTANT Resistant     TETRACYCLINE <=1 SENSITIVE Sensitive     VANCOMYCIN 1 SENSITIVE Sensitive     TRIMETH/SULFA >=320 RESISTANT Resistant     CLINDAMYCIN >=8 RESISTANT Resistant     RIFAMPIN <=0.5 SENSITIVE Sensitive     Inducible Clindamycin NEGATIVE Sensitive     * FEW METHICILLIN RESISTANT STAPHYLOCOCCUS AUREUS  Fungus Culture Result     Status: None   Collection Time: 03/19/21  3:07 PM  Result Value Ref Range Status   Result 1 Comment  Final    Comment: (NOTE) KOH/Calcofluor preparation:  no fungus observed. Performed  At: [facility] [place], [place] [id number] Rush Farmer MD XH:[id number]     Radiology Reports CT CHEST W CONTRAST  Result Date: 03/22/2021 CLINICAL DATA:  70 year old male with chest pain and shortness of breath. EXAM: CT CHEST WITH CONTRAST TECHNIQUE: Multidetector CT imaging of the chest was performed during intravenous contrast administration. CONTRAST:  20mL OMNIPAQUE IOHEXOL 300 MG/ML  SOLN COMPARISON:  Chest CT dated 03/17/2021. FINDINGS: Cardiovascular: There is no cardiomegaly or pericardial effusion. There is 3 vessel coronary vascular calcification. There is mild atherosclerotic calcification of the thoracic aorta. No aneurysmal dilatation or dissection. The origins of the great vessels of the aortic arch appear patent as visualized. No pulmonary artery embolus identified. Mediastinum/Nodes: No hilar or mediastinal adenopathy. The esophagus is grossly unremarkable. No mediastinal fluid collection. Lungs/Pleura: Small bilateral pleural effusions with partial compressive atelectasis of the lower lobes. Pneumonia is not excluded. Clinical correlation is recommended. No lobar consolidation or pneumothorax. The central airways are patent. Upper Abdomen: No acute abnormality. Musculoskeletal: Degenerative changes of the spine. There is an area of inflammatory changes with small pockets of air in the right anterior chest wall along the anterior right sixth rib. There is fragmentation of the anterior right sixth rib concerning for osteomyelitis. Clinical correlation is recommended. No drainable fluid collection or abscess identified. There is thickening of the adjacent pleura. There is a large skin wound in the lower right anterior chest wall with a wound VAC. IMPRESSION: 1. No CT evidence of pulmonary embolism. 2. Small bilateral pleural effusions with partial compressive atelectasis of the lower lobes. Pneumonia is not excluded. Clinical correlation is recommended. 3. Large  skin wound in the lower right anterior chest wall with a wound VAC. 4. Destructive changes of the anterior right sixth rib concerning for osteomyelitis. Clinical correlation is recommended. No drainable fluid collection or abscess. 5. Aortic Atherosclerosis (ICD10-I70.0). Electronically Signed   By: Anner Crete M.D.   On: 03/22/2021 19:48   CT CHEST W CONTRAST  Result Date: 03/17/2021 CLINICAL DATA:  Infected right chest wall hematoma EXAM: CT CHEST WITH CONTRAST TECHNIQUE: Multidetector CT imaging of the chest was performed during intravenous contrast administration. CONTRAST:  75 mL OMNIPAQUE IOHEXOL 350 MG/ML SOLN COMPARISON:  03/13/2021 FINDINGS: Cardiovascular: Heart size within normal limits. Coronary artery calcifications seen throughout. No significant vascular abnormality identified. Mediastinum/Nodes: No enlarged mediastinal, hilar, or axillary lymph nodes. Lungs/Pleura: Trace bilateral pleural effusions with adjacent atelectasis. Upper Abdomen: No acute abnormality. Musculoskeletal: Previously seen right chest wall collection has decreased in size since prior examination, measuring approximately 7.4 x 7.1 x 3.7 cm compared to 10.1 x 9.1 x 5.7 cm on prior exam from 03/13/2021. The collection is somewhat bilobed. The inferior portion of the right chest wall collection has decreased to a greater degree than the superior lobulation.  Greater amount of air within the collection likely due to presence of drains. Lucency and irregularity of the right sixth and seventh ribs, anterior segment again noted. IMPRESSION: 1. Interval decrease in size of bilobed right anterolateral chest wall fluid collection. The inferior lobulation is nearly completely resolved with some fat stranding and subcutaneous emphysema remaining. The more superior lobulation has decreased in size since prior study from 03/13/2021. The overall size of the collections now measures 7.4 x 7.1 x 3.7 cm compared to 10.1 x 9.1 x 5.7 cm on  03/13/2021. 2. Lucency in the anterior segments of the right sixth and seventh ribs again seen. Findings are suspicious for osteomyelitis, however underlying pathologic fracture is also possible. Electronically Signed   By: Miachel Roux M.D.   On: 03/17/2021 14:05   CT Chest W Contrast  Result Date: 03/13/2021 CLINICAL DATA:  Follow-up right chest wall hematoma and fractures of the right sixth through eighth ribs. Diabetes. HIV. EXAM: CT CHEST WITH CONTRAST TECHNIQUE: Multidetector CT imaging of the chest was performed during intravenous contrast administration. CONTRAST:  127mL OMNIPAQUE IOHEXOL 300 MG/ML  SOLN COMPARISON:  03/09/2021 FINDINGS: Cardiovascular: Heart size remains normal. No pericardial fluid. Coronary artery calcification as seen previously. Aortic atherosclerotic calcification as seen previously. Mediastinum/Nodes: Normal Lungs/Pleura: Scarring/atelectasis at the lung bases left more than right unchanged since the previous study. Small areas of patchy density in the superior segment of the right lower lobe unchanged from the study of 4 days ago. No new or progressive pulmonary finding. No pneumothorax. Upper Abdomen: No upper abdominal injury or acute finding. Musculoskeletal: There is increasing size of the right chest wall collections, primarily centered around the right sixth and eighth anterolateral ribs. Those ribs show a pattern of irregular destruction that look more like pathologic fractures due to infection rather than traumatic fractures. Air bubbles are now present within the collections. There is mild extrapleural involvement in side of the ribcage, without frank breakthrough into the pleural space. Most of the collections manifest external to the ribcage. No new areas of involvement are seen. IMPRESSION: Increasing size of the right chest wall collections, primarily centered around the right sixth and eighth anterolateral ribs, with development of air bubbles. Those ribs show a  pattern of irregular destruction that have progressed and look more like pathologic fractures due to infection rather than traumatic fractures. Air bubbles are now present within the collections and they probably represent abscesses. There is mild extrapleural involvement on the inner side of the ribcage, without frank breakthrough into the pleural space. The majority of the collections are on the outer side of the ribcage. No new areas of involvement are seen. Case discussed with Dr. Regenia Skeeter at 1419 hours. Aortic Atherosclerosis (ICD10-I70.0). Electronically Signed   By: Nelson Chimes M.D.   On: 03/13/2021 14:19   CT Chest W Contrast  Result Date: 03/09/2021 CLINICAL DATA:  Fall 3 weeks prior with right rib fractures and persistent chest pain. EXAM: CT CHEST WITH CONTRAST TECHNIQUE: Multidetector CT imaging of the chest was performed during intravenous contrast administration. CONTRAST:  34mL OMNIPAQUE IOHEXOL 300 MG/ML  SOLN COMPARISON:  Chest radiograph from earlier today. 11/22/2019 chest CT angiogram. FINDINGS: Cardiovascular: Normal heart size. No significant pericardial effusion/thickening. Three-vessel coronary atherosclerosis. Atherosclerotic nonaneurysmal thoracic aorta. Top-normal caliber main pulmonary artery (3.0 cm diameter). No central pulmonary emboli. Mediastinum/Nodes: No discrete thyroid nodules. Unremarkable esophagus. No pathologically enlarged axillary, mediastinal or hilar lymph nodes. Lungs/Pleura: No pneumothorax. No pleural effusion. No acute consolidative airspace disease, lung masses  or significant pulmonary nodules. Mild platelike atelectasis in the anterior basilar left lower lobe. Mild patchy ground-glass opacity in peripheral posterior right mid lung. Upper abdomen: No acute abnormality. Musculoskeletal: No aggressive appearing focal osseous lesions. Nondisplaced acute anterior right sixth, seventh and eighth rib fractures with surrounding chest wall hematoma measuring up to 8.1 x  4.9 cm in maximum axial dimensions at the level of the anterior right sixth rib fracture (series 2/image 114). Moderate thoracic spondylosis. IMPRESSION: 1. Nondisplaced acute anterior right sixth, seventh and eighth rib fractures with surrounding chest wall hematoma. No pneumothorax or hemothorax. No discrete bone lesions. Clinical follow-up advised to ensure resolution of the right chest wall hematoma. Any need for follow-up imaging should be based on clinical assessment. 2. Mild patchy ground-glass opacity in the peripheral posterior right mid lung, nonspecific, favor mild pulmonary contusion. 3. Mild platelike atelectasis at the left lung base. 4. Three-vessel coronary atherosclerosis. 5. Aortic Atherosclerosis (ICD10-I70.0). Electronically Signed   By: Ilona Sorrel M.D.   On: 03/09/2021 13:03   IR US Guide Bx Asp/Drain  Result Date: 03/14/2021 INDICATION: 70 year old with history of fall and right rib fractures. Patient developed right chest hematomas. Patient is complaining of pain and recent CT demonstrates gas within the hematomas. Findings are concerning for infected hematomas. EXAM: PLACEMENT OF SUPERFICIAL RIGHT ANTERIOR CHEST WALL DRAIN USING ULTRASOUND GUIDANCE x 2 MEDICATIONS: Moderate sedation ANESTHESIA/SEDATION: Fentanyl 100 mcg IV; Versed 2.0 mg IV Moderate Sedation Time:  29 minutes The patient was continuously monitored during the procedure by the interventional radiology nurse under my direct supervision. COMPLICATIONS: None immediate. PROCEDURE: Informed written consent was obtained from the patient after a thorough discussion of the procedural risks, benefits and alternatives. All questions were addressed. Maximal Sterile Barrier Technique was utilized including caps, mask, sterile gowns, sterile gloves, sterile drape, hand hygiene and skin antiseptic. A timeout was performed prior to the initiation of the procedure. The right anterior chest wall hematomas were evaluated with ultrasound.  Complex collections containing a small amount of fluid were obtained. The right anterior chest was prepped with chlorhexidine and sterile field was created. Attention was initially directed to the more inferior or caudal anterior chest wall collection. Skin was anesthetized with 1% lidocaine. Using ultrasound guidance, an 18 gauge trocar needle was directed into the collection and pink purulent fluid was aspirated. Superstiff Amplatz wire was advanced into the complex collection and the tract was dilated to accommodate a 10 [place] drain. Catheter was sutured to the skin and attached to a suction bulb. A sample of fluid was sent for culture. A second area more cephalad was targeted with ultrasound guidance. Skin was anesthetized with 1% lidocaine. Using ultrasound guidance, an 18 gauge trocar needle was directed into the complex collection. Again, purulent fluid was aspirated. Superstiff Amplatz wire was advanced into this collection and a 10 French drain was placed. Additional purulent fluid was aspirated and the catheter was sutured to skin and attached to a suction bulb. Dressings were placed over both drains. FINDINGS: Patient has palpable hematomas in the right anterior chest and upper abdomen region. Ultrasound demonstrates heterogeneous collections in both areas containing a small amount of compressible fluid. There are echogenic areas within the collections compatible with known gas. 10 French drains were placed in both areas and purulent fluid was draining from both collections. Both collections are very complex and compatible with infected hematomas. IMPRESSION: Ultrasound-guided placement of 2 percutaneous drains within the superficial right anterior chest wall collections. Findings are compatible with infected hematomas.  Fluid sample was sent for culture. Electronically Signed   By: Markus Daft M.D.   On: 03/14/2021 18:37   IR US Guide Bx Asp/Drain  Result Date: 03/14/2021 INDICATION: 70 year old with  history of fall and right rib fractures. Patient developed right chest hematomas. Patient is complaining of pain and recent CT demonstrates gas within the hematomas. Findings are concerning for infected hematomas. EXAM: PLACEMENT OF SUPERFICIAL RIGHT ANTERIOR CHEST WALL DRAIN USING ULTRASOUND GUIDANCE x 2 MEDICATIONS: Moderate sedation ANESTHESIA/SEDATION: Fentanyl 100 mcg IV; Versed 2.0 mg IV Moderate Sedation Time:  29 minutes The patient was continuously monitored during the procedure by the interventional radiology nurse under my direct supervision. COMPLICATIONS: None immediate. PROCEDURE: Informed written consent was obtained from the patient after a thorough discussion of the procedural risks, benefits and alternatives. All questions were addressed. Maximal Sterile Barrier Technique was utilized including caps, mask, sterile gowns, sterile gloves, sterile drape, hand hygiene and skin antiseptic. A timeout was performed prior to the initiation of the procedure. The right anterior chest wall hematomas were evaluated with ultrasound. Complex collections containing a small amount of fluid were obtained. The right anterior chest was prepped with chlorhexidine and sterile field was created. Attention was initially directed to the more inferior or caudal anterior chest wall collection. Skin was anesthetized with 1% lidocaine. Using ultrasound guidance, an 18 gauge trocar needle was directed into the collection and pink purulent fluid was aspirated. Superstiff Amplatz wire was advanced into the complex collection and the tract was dilated to accommodate a 10 [place] drain. Catheter was sutured to the skin and attached to a suction bulb. A sample of fluid was sent for culture. A second area more cephalad was targeted with ultrasound guidance. Skin was anesthetized with 1% lidocaine. Using ultrasound guidance, an 18 gauge trocar needle was directed into the complex collection. Again, purulent fluid was aspirated.  Superstiff Amplatz wire was advanced into this collection and a 10 French drain was placed. Additional purulent fluid was aspirated and the catheter was sutured to skin and attached to a suction bulb. Dressings were placed over both drains. FINDINGS: Patient has palpable hematomas in the right anterior chest and upper abdomen region. Ultrasound demonstrates heterogeneous collections in both areas containing a small amount of compressible fluid. There are echogenic areas within the collections compatible with known gas. 10 French drains were placed in both areas and purulent fluid was draining from both collections. Both collections are very complex and compatible with infected hematomas. IMPRESSION: Ultrasound-guided placement of 2 percutaneous drains within the superficial right anterior chest wall collections. Findings are compatible with infected hematomas. Fluid sample was sent for culture. Electronically Signed   By: Markus Daft M.D.   On: 03/14/2021 18:37   DG Chest Port 1 View  Result Date: 03/13/2021 CLINICAL DATA:  Sepsis, right rib pain. EXAM: PORTABLE CHEST 1 VIEW COMPARISON:  March 09, 2021. FINDINGS: The heart size and mediastinal contours are within normal limits. Both lungs are clear. No pneumothorax or pleural effusion is noted. The visualized skeletal structures are unremarkable. IMPRESSION: No active disease. Electronically Signed   By: Marijo Conception M.D.   On: 03/13/2021 14:11   DG Chest Portable 1 View  Result Date: 03/09/2021 CLINICAL DATA:  Chest pain. Chest Arctic hurting late yesterday. Complains of right-sided sternal pain and chest pain. EXAM: PORTABLE CHEST 1 VIEW COMPARISON:  None. FINDINGS: The heart size and mediastinal contours are within normal limits. Both lungs are clear. Blunting of the left costophrenic angle is  noted, new from prior studies. The visualized skeletal structures are unremarkable. IMPRESSION: New blunting of left costophrenic angle may reflect small  effusion. The lungs are otherwise clear. No signs of interstitial edema. Electronically Signed   By: Kerby Moors M.D.   On: 03/09/2021 11:26   [place] IMAGE GUIDED DRAINAGE BY PERCUTANEOUS CATHETER  Result Date: 03/18/2021 INDICATION: History of right-sided rib fractures complicated by development of infected adjacent hematomas, post ultrasound-guided placement of two percutaneous drainage catheters on 03/14/2021. Unfortunately, one of the percutaneous drainage catheters was inadvertently removed with subsequent chest CT performed 03/17/2021 demonstrating a persistent undrained complex fluid collection involving the anterior aspect of the right chest wall. As such, request made for ultrasound-guided aspiration and/or drainage catheter placement for infection source control purposes. EXAM: 1. ULTRASOUND-GUIDED RIGHT CHEST WALL PERCUTANEOUS DRAINAGE CATHETER PLACEMENT X2 2. ULTRASOUND-GUIDED RIGHT CHEST WALL ABSCESS ASPIRATION COMPARISON:  Chest CT-03/17/2021; 03/13/2021; ultrasound-guided right chest wall percutaneous drainage catheter placement x2-03/14/2021 MEDICATIONS: The patient is currently admitted to the hospital and receiving intravenous antibiotics. The antibiotics were administered within an appropriate time frame prior to the initiation of the procedure. ANESTHESIA/SEDATION: Moderate (conscious) sedation was employed during this procedure. A total of Versed 4 mg and Fentanyl 200 mcg was administered intravenously. Moderate Sedation Time: 41 minutes. The patient's level of consciousness and vital signs were monitored continuously by radiology nursing throughout the procedure under my direct supervision. CONTRAST:  None COMPLICATIONS: None immediate. PROCEDURE: Informed written consent was obtained from the patient after a discussion of the risks, benefits and alternatives to treatment. Preprocedural ultrasound scanning demonstrated complex fluid involving the anterior aspect of the right chest wall  compatible with the findings seen on preceding chest CT. A timeout was performed prior to the initiation of the procedure. The skin overlying the right anterior chest was prepped and draped in the usual sterile fashion. The overlying soft tissues were anesthetized with 1% lidocaine with epinephrine. Under direct ultrasound guidance, a 18 gauge trocar needle was advanced into the complex abscess within the right chest wall, inferior to the nipple. A short Amplatz wire was coiled within the collection. The track was dilated ultimately allowing placement of a 10 French percutaneous catheter. Multiple ultrasound images were saved procedural documentation purposes. Next, approximately 10 cc of purulent fluid was aspirated from the drainage catheter Sonographic evaluation demonstrates a residual complex fluid collection and as such the more medial component of the collection was accessed with an 18 gauge trocar needle. A short Amplatz wire was coiled within the collection and an additional 10 French percutaneous drainage catheter was placed under ultrasound guidance. Multiple ultrasound images were saved procedural documentation purposes. Next, approximately 30 cc of purulent fluid was aspirated from this more medially positioned chest wall drain. A representative sample was capped and sent to the laboratory for analysis. Next, the lateral component of the right chest wall collection was accessed with an 18 gauge trocar needle however only a small amount of bloody fluid was able to be aspirated. As such, a short Amplatz wire was coiled within the collection and the trocar needle was exchanged for a Yueh sheath catheter which was utilized to aspirate approximately 3 cc of thick bloody fluid. At this time, approximately 70 cc of additional blood-tinged purulent fluid was able to be expressed from the site of the previously removed percutaneous drainage catheter. Drainage catheters were secured at the entrance site within  interrupted sutures and drainage catheters were connected to JP bulbs. Dressings were applied. The patient tolerated the procedure  well without immediate postprocedural complication. IMPRESSION: 1. Successful ultrasound-guided placement of 2 two additional right anterior chest wall percutaneous drainage catheters yielding a total of 40 cc of purulent fluid. A representative aspirated sample was sent to the laboratory for analysis. 2. Successful ultrasound-guided aspiration of approximately 3 cc of thick bloody fluid from the more lateral component of the right anterior chest wall complex hematoma. 3. Successful bedside expression of approximately 70 cc of purulent fluid from the entrance site of the recently removed percutaneous drainage catheter. Electronically Signed   By: Sandi Mariscal M.D.   On: 03/18/2021 15:29   [place] IMAGE GUIDED DRAINAGE BY PERCUTANEOUS CATHETER  Result Date: 03/18/2021 INDICATION: History of right-sided rib fractures complicated by development of infected adjacent hematomas, post ultrasound-guided placement of two percutaneous drainage catheters on 03/14/2021. Unfortunately, one of the percutaneous drainage catheters was inadvertently removed with subsequent chest CT performed 03/17/2021 demonstrating a persistent undrained complex fluid collection involving the anterior aspect of the right chest wall. As such, request made for ultrasound-guided aspiration and/or drainage catheter placement for infection source control purposes. EXAM: 1. ULTRASOUND-GUIDED RIGHT CHEST WALL PERCUTANEOUS DRAINAGE CATHETER PLACEMENT X2 2. ULTRASOUND-GUIDED RIGHT CHEST WALL ABSCESS ASPIRATION COMPARISON:  Chest CT-03/17/2021; 03/13/2021; ultrasound-guided right chest wall percutaneous drainage catheter placement x2-03/14/2021 MEDICATIONS: The patient is currently admitted to the hospital and receiving intravenous antibiotics. The antibiotics were administered within an appropriate time frame prior to the  initiation of the procedure. ANESTHESIA/SEDATION: Moderate (conscious) sedation was employed during this procedure. A total of Versed 4 mg and Fentanyl 200 mcg was administered intravenously. Moderate Sedation Time: 41 minutes. The patient's level of consciousness and vital signs were monitored continuously by radiology nursing throughout the procedure under my direct supervision. CONTRAST:  None COMPLICATIONS: None immediate. PROCEDURE: Informed written consent was obtained from the patient after a discussion of the risks, benefits and alternatives to treatment. Preprocedural ultrasound scanning demonstrated complex fluid involving the anterior aspect of the right chest wall compatible with the findings seen on preceding chest CT. A timeout was performed prior to the initiation of the procedure. The skin overlying the right anterior chest was prepped and draped in the usual sterile fashion. The overlying soft tissues were anesthetized with 1% lidocaine with epinephrine. Under direct ultrasound guidance, a 18 gauge trocar needle was advanced into the complex abscess within the right chest wall, inferior to the nipple. A short Amplatz wire was coiled within the collection. The track was dilated ultimately allowing placement of a 10 French percutaneous catheter. Multiple ultrasound images were saved procedural documentation purposes. Next, approximately 10 cc of purulent fluid was aspirated from the drainage catheter Sonographic evaluation demonstrates a residual complex fluid collection and as such the more medial component of the collection was accessed with an 18 gauge trocar needle. A short Amplatz wire was coiled within the collection and an additional 10 French percutaneous drainage catheter was placed under ultrasound guidance. Multiple ultrasound images were saved procedural documentation purposes. Next, approximately 30 cc of purulent fluid was aspirated from this more medially positioned chest wall drain. A  representative sample was capped and sent to the laboratory for analysis. Next, the lateral component of the right chest wall collection was accessed with an 18 gauge trocar needle however only a small amount of bloody fluid was able to be aspirated. As such, a short Amplatz wire was coiled within the collection and the trocar needle was exchanged for a Yueh sheath catheter which was utilized to aspirate approximately 3 cc of thick  bloody fluid. At this time, approximately 70 cc of additional blood-tinged purulent fluid was able to be expressed from the site of the previously removed percutaneous drainage catheter. Drainage catheters were secured at the entrance site within interrupted sutures and drainage catheters were connected to JP bulbs. Dressings were applied. The patient tolerated the procedure well without immediate postprocedural complication. IMPRESSION: 1. Successful ultrasound-guided placement of 2 two additional right anterior chest wall percutaneous drainage catheters yielding a total of 40 cc of purulent fluid. A representative aspirated sample was sent to the laboratory for analysis. 2. Successful ultrasound-guided aspiration of approximately 3 cc of thick bloody fluid from the more lateral component of the right anterior chest wall complex hematoma. 3. Successful bedside expression of approximately 70 cc of purulent fluid from the entrance site of the recently removed percutaneous drainage catheter. Electronically Signed   By: Sandi Mariscal M.D.   On: 03/18/2021 15:29   [place] FINE NEEDLE ASP 1ST LESION  Result Date: 03/18/2021 INDICATION: History of right-sided rib fractures complicated by development of infected adjacent hematomas, post ultrasound-guided placement of two percutaneous drainage catheters on 03/14/2021. Unfortunately, one of the percutaneous drainage catheters was inadvertently removed with subsequent chest CT performed 03/17/2021 demonstrating a persistent undrained complex fluid  collection involving the anterior aspect of the right chest wall. As such, request made for ultrasound-guided aspiration and/or drainage catheter placement for infection source control purposes. EXAM: 1. ULTRASOUND-GUIDED RIGHT CHEST WALL PERCUTANEOUS DRAINAGE CATHETER PLACEMENT X2 2. ULTRASOUND-GUIDED RIGHT CHEST WALL ABSCESS ASPIRATION COMPARISON:  Chest CT-03/17/2021; 03/13/2021; ultrasound-guided right chest wall percutaneous drainage catheter placement x2-03/14/2021 MEDICATIONS: The patient is currently admitted to the hospital and receiving intravenous antibiotics. The antibiotics were administered within an appropriate time frame prior to the initiation of the procedure. ANESTHESIA/SEDATION: Moderate (conscious) sedation was employed during this procedure. A total of Versed 4 mg and Fentanyl 200 mcg was administered intravenously. Moderate Sedation Time: 41 minutes. The patient's level of consciousness and vital signs were monitored continuously by radiology nursing throughout the procedure under my direct supervision. CONTRAST:  None COMPLICATIONS: None immediate. PROCEDURE: Informed written consent was obtained from the patient after a discussion of the risks, benefits and alternatives to treatment. Preprocedural ultrasound scanning demonstrated complex fluid involving the anterior aspect of the right chest wall compatible with the findings seen on preceding chest CT. A timeout was performed prior to the initiation of the procedure. The skin overlying the right anterior chest was prepped and draped in the usual sterile fashion. The overlying soft tissues were anesthetized with 1% lidocaine with epinephrine. Under direct ultrasound guidance, a 18 gauge trocar needle was advanced into the complex abscess within the right chest wall, inferior to the nipple. A short Amplatz wire was coiled within the collection. The track was dilated ultimately allowing placement of a 10 French percutaneous catheter. Multiple  ultrasound images were saved procedural documentation purposes. Next, approximately 10 cc of purulent fluid was aspirated from the drainage catheter Sonographic evaluation demonstrates a residual complex fluid collection and as such the more medial component of the collection was accessed with an 18 gauge trocar needle. A short Amplatz wire was coiled within the collection and an additional 10 French percutaneous drainage catheter was placed under ultrasound guidance. Multiple ultrasound images were saved procedural documentation purposes. Next, approximately 30 cc of purulent fluid was aspirated from this more medially positioned chest wall drain. A representative sample was capped and sent to the laboratory for analysis. Next, the lateral component of the right chest wall  collection was accessed with an 18 gauge trocar needle however only a small amount of bloody fluid was able to be aspirated. As such, a short Amplatz wire was coiled within the collection and the trocar needle was exchanged for a Yueh sheath catheter which was utilized to aspirate approximately 3 cc of thick bloody fluid. At this time, approximately 70 cc of additional blood-tinged purulent fluid was able to be expressed from the site of the previously removed percutaneous drainage catheter. Drainage catheters were secured at the entrance site within interrupted sutures and drainage catheters were connected to JP bulbs. Dressings were applied. The patient tolerated the procedure well without immediate postprocedural complication. IMPRESSION: 1. Successful ultrasound-guided placement of 2 two additional right anterior chest wall percutaneous drainage catheters yielding a total of 40 cc of purulent fluid. A representative aspirated sample was sent to the laboratory for analysis. 2. Successful ultrasound-guided aspiration of approximately 3 cc of thick bloody fluid from the more lateral component of the right anterior chest wall complex hematoma. 3.  Successful bedside expression of approximately 70 cc of purulent fluid from the entrance site of the recently removed percutaneous drainage catheter. Electronically Signed   By: Sandi Mariscal M.D.   On: 03/18/2021 15:29

## 2021-03-26 NOTE — Plan of Care (Signed)
°  Problem: Education: °Goal: Knowledge of General Education information will improve °Description: Including pain rating scale, medication(s)/side effects and non-pharmacologic comfort measures °Outcome: Progressing °  °Problem: Health Behavior/Discharge Planning: °Goal: Ability to manage health-related needs will improve °Outcome: Progressing °  °Problem: Clinical Measurements: °Goal: Ability to maintain clinical measurements within normal limits will improve °Outcome: Progressing °Goal: Respiratory complications will improve °Outcome: Progressing °  °Problem: Activity: °Goal: Risk for activity intolerance will decrease °Outcome: Progressing °  °Problem: Pain Managment: °Goal: General experience of comfort will improve °Outcome: Progressing °  °

## 2021-03-26 NOTE — Progress Notes (Signed)
Patient upset and requesting to speak to physician in regards to pain medication schedule. This nurse in to dose oxy/apap at 1735 and patient requested for dilaudid to be given when due as he was taking the oxycodone. This nurse explained to patient that it would be a while before he could have the dilaudid again as he just took the oxy/apap. Patient also educated on a consistency in taking the p.o. medication would give him better pain control in the long run. He stated that the 'doctor told him he could have them both and he wants both'. Patient educated on pain control and that he would not be going home with IV pain medication. He requested the MD to discuss. Dr. Johnette Abraham on the unit, explained the situation to him. This nurse and physician into patient room to discuss pain medication schedule and risks of taking it too soon. Dr. Johnette Abraham explained to patient that he could only take pain medication every 3 hours and not any sooner together. Explained that he could have dilaudid again 3 hours from time that he took the last oxy/apap dose as it was to be used for severe breakthrough pain only. Patient stated that 'he has to wait to get pain medication because they take too long to come in' and that he had not 'received meds every 3 hours today'. This nurse educated patient that  during the current shift he had rec'd dilaudid twice and oxy/apap twice. Patient upset with physician and this nurse at education being provided and requesting to speak to surgeon. Hospitalist explained that his pain medication schedule would be every 3 hours at the earliest throughout the rest of his stay.

## 2021-03-27 ENCOUNTER — Other Ambulatory Visit (HOSPITAL_COMMUNITY): Payer: Self-pay

## 2021-03-27 DIAGNOSIS — L02213 Cutaneous abscess of chest wall: Secondary | ICD-10-CM | POA: Diagnosis not present

## 2021-03-27 DIAGNOSIS — B20 Human immunodeficiency virus [HIV] disease: Secondary | ICD-10-CM | POA: Diagnosis not present

## 2021-03-27 DIAGNOSIS — B182 Chronic viral hepatitis C: Secondary | ICD-10-CM | POA: Diagnosis not present

## 2021-03-27 DIAGNOSIS — J869 Pyothorax without fistula: Secondary | ICD-10-CM | POA: Diagnosis not present

## 2021-03-27 LAB — GLUCOSE, CAPILLARY
Glucose-Capillary: 118 mg/dL — ABNORMAL HIGH (ref 70–99)
Glucose-Capillary: 142 mg/dL — ABNORMAL HIGH (ref 70–99)
Glucose-Capillary: 221 mg/dL — ABNORMAL HIGH (ref 70–99)
Glucose-Capillary: 160 mg/dL — ABNORMAL HIGH (ref 70–99)

## 2021-03-27 MED ORDER — OXYCODONE-ACETAMINOPHEN 5-325 MG PO TABS
1.0000 | ORAL_TABLET | Freq: Four times a day (QID) | ORAL | Status: DC | PRN
  Administered 2021-03-28 (×2): 1 via ORAL
  Filled 2021-03-27 (×2): qty 1

## 2021-03-27 MED ORDER — LINEZOLID 600 MG PO TABS
600.0000 mg | ORAL_TABLET | Freq: Two times a day (BID) | ORAL | Status: DC
  Administered 2021-04-10: 600 mg via ORAL
  Filled 2021-03-27 (×2): qty 1

## 2021-03-27 MED ORDER — LINEZOLID 600 MG PO TABS
600.0000 mg | ORAL_TABLET | Freq: Two times a day (BID) | ORAL | 0 refills | Status: DC
  Filled 2021-03-27 – 2021-04-03 (×2): qty 42, 21d supply, fill #0

## 2021-03-27 NOTE — Consult Note (Signed)
WOC Nurse Consult Note: Patient receiving care in [facility] [phone]. Reason for Consult: chest VAC change twice weekly Mondays and Fridays Wound type: surgical Pressure Injury POA: Yes/No/NA Measurement: 8 cm x 13 cm x 1.3 cm.  There is undermining from 12 - 2 with the greatest area at 12 and measuring 4.2 cm. Wound bed: cellular matrix observed in wound bed, otherwise 100% clean and pink Drainage (amount, consistency, odor) serosanginous in canister Periwound: intact Dressing procedure/placement/frequency: one thin piece inserted under the skin from 12 -2, then one piece placed over the entire wound bed. Drape applied, immediate seal obtained at 125 mmHg. Patient anxious, but tolerated well. Val Riles, RN, MSN, CWOCN, CNS-BC, pager [phone]

## 2021-03-27 NOTE — Progress Notes (Signed)
PROGRESS NOTE                                                                                                                                                                                                             Patient Demographics:    Brian Malone, is a 70 y.o. male, DOB - January 06, 1951, MOQ:[id number]  Outpatient Primary MD for the patient is [name], [name], MD    LOS - 14  Admit date - 03/13/2021    Chief Complaint  Patient presents with  . Rib Injury       Brief Narrative (HPI from H&P)   70 year old male with past medical history of hepatitis C, hypertension, hyperlipidemia, diabetes mellitus type 2, chronic pain syndrome, intravenous drug use (abstinent since 2019), major depressive disorder and HIV who had a trip and fall in his yard on 02/22/2021 after which he hurt his right-sided chest wall, at that time he was diagnosed with right-sided rib cage fracture and injury, he was seen in the ER a few times since then and was treated conservatively, his pain and discomfort continued to get worse and he presented again to [facility] on  03/13/2021 CT scan suggested that he had right chest wall abscess formation and cellulitis with concerns for sepsis.,  Patient went for drain placement by IR with minimal improvement, seen by CT surgery, went to the OR 03/19/2021 with open incision and drainage and wound VAC, back to the OR 4/6 for wound VAC exchange and some debridement, remains on IV daptomycin.   Subjective:   Patient denies any fever, chills, denies any constipation, discussed with patient, informed him will be able to request pain medicine every 3 hours, either Dilaudid or Percocet.   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  Now abscess considerably improved.  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 - ID consulted and following.  he is on Daptomycin since 03/23/2021 previously was  on Vancomycin, antibiotics management per ID, will need IV daptomycin total time 3 weeks from 03/19/2021, renal to continue p.o. renal colic as an outpatient till seen by ID clinic. - Cardiothoracic surgery took patient back to the OR 4/6, for further chest wall debridement, and wound VAC change.  - Encouraged the patient to sit up in chair in the daytime use I-S and flutter valve for pulmonary toiletry.  Will advance activity and titrate down oxygen as possible.  Not a good candidate for PICC line and home IV antibiotics history of IV drug use in the past.   Chronic pain and narcotic use.   Supportive care as needed for acute discomfort, avoid overuse, as needed Narcan added.  Patient counseled , explained he can request either IV or p.o pain medicine every 3 hours.Marland Kitchen  HIV - Continue home regimen follows with [facility] ID department.  Anxiety and depression.  Home medications continued.  Currently not homicidal or suicidal.  GERD.  On PPI.  BPH.  On Flomax.  AKI.  Due to ATN from sepsis, resolved after hydration, gentle hydration again on 03/24/2021.  Hypertension.  Blood pressure soft, blood pressure medicine has been discontinued.  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  On sliding scale monitor and adjust  Lab Results  Component Value Date   HGBA1C 8.3 (H) 03/14/2021   CBG (last 3)  Recent Labs    03/26/21 1719 03/27/21 0727 03/27/21 1154  GLUCAP 147* 118* 142*          Condition - Extremely Guarded  Family Communication  : None present  bedside  Code Status :  Full  Consults  :  IR, cardiothoracic surgery, ID  PUD Prophylaxis : PPI   Procedures  :     Right chest wall I&D by Dr. Kipp Brood 2/37/62 -Repeat application of wound VAC and incision and drainage on 4/6  US guided chest wall abscess drined by IR 03/14/21   CT -  Increasing size of the right chest wall collections, primarily centered around the right sixth and eighth anterolateral ribs, with development of air bubbles. Those ribs show a pattern of irregular destruction that have progressed and look more like pathologic fractures due to infection rather than traumatic fractures. Air bubbles are now present within the collections and they probably represent abscesses. There is mild extrapleural involvement on the inner side of the ribcage, without frank breakthrough into the pleural space. The majority of the collections are on the outer side of the ribcage. No new areas of involvement are seen.  CT repeat 03/17/21 - 1. Interval decrease in size of bilobed right anterolateral chest wall fluid collection. The inferior lobulation is nearly completely resolved with some fat stranding and subcutaneous emphysema remaining. The more superior lobulation has decreased in size since prior study from 03/13/2021. The overall size of the collections now measures 7.4 x 7.1 x 3.7 cm compared to 10.1 x 9.1 x 5.7 cm on 03/13/2021. 2. Lucency in the anterior segments of the right sixth and seventh ribs again seen. Findings are suspicious for osteomyelitis, however underlying pathologic fracture is also possible.      Disposition Plan  :    Status is: Inpatient  Remains inpatient appropriate because:IV treatments appropriate due to intensity of illness or inability to take PO   Dispo: The patient is from: Home              Anticipated d/c is to: Home  Patient currently is not medically stable to d/c.   Difficult to place patient No  DVT Prophylaxis  :   Heparin   Lab  Results  Component Value Date   PLT 332 03/26/2021    Diet :  Diet Order            Diet Carb Modified Fluid consistency: Thin; Room service appropriate? Yes  Diet effective now                  Inpatient Medications  Scheduled Meds: . (feeding supplement) PROSource Plus  30 mL Oral TID WC  . abacavir-dolutegravir-lamiVUDine  1 tablet Oral Daily  . busPIRone  30 mg Oral BID  . clonazepam  0.5 mg Oral TID  . ezetimibe  10 mg Oral Daily  . feeding supplement  237 mL Oral BID BM  . feeding supplement (GLUCERNA SHAKE)  237 mL Oral Q24H  . Glecaprevir-Pibrentasvir  3 tablet Oral Daily  . heparin injection (subcutaneous)  5,000 Units Subcutaneous Q8H  . insulin aspart  0-15 Units Subcutaneous TID AC & HS  . metoprolol tartrate  25 mg Oral BID  . multivitamin with minerals  1 tablet Oral Daily  . pantoprazole  40 mg Oral QAC breakfast  . tamsulosin  0.4 mg Oral QHS  . tiZANidine  2 mg Oral QHS   Continuous Infusions: . sodium chloride 50 mL/hr at 03/24/21 2342  . DAPTOmycin (CUBICIN)  IV Stopped (03/26/21 2229)  . lactated ringers 10 mL/hr at 03/25/21 1007   PRN Meds:.sodium chloride, acetaminophen **OR** [DISCONTINUED] acetaminophen, antiseptic oral rinse, HYDROmorphone (DILAUDID) injection **OR** [DISCONTINUED]  HYDROmorphone (DILAUDID) injection, naLOXone (NARCAN)  injection, [DISCONTINUED] ondansetron **OR** ondansetron (ZOFRAN) IV, oxyCODONE-acetaminophen, polyethylene glycol, valACYclovir  Antibiotics  :    Anti-infectives (From admission, onward)   Start     Dose/Rate Route Frequency Ordered Stop   03/27/21 0000  linezolid (ZYVOX) 600 MG tablet       Note to Pharmacy: Patient staying through next week for IV abx - Just working ahead.   600 mg Oral 2 times daily 03/27/21 0933 04/17/21 2359   03/23/21 2000  DAPTOmycin (CUBICIN) 700 mg in sodium chloride 0.9 % IVPB        8 mg/kg  85.2 kg 128 mL/hr over 30 Minutes Intravenous Every 24 hours 03/23/21 1133     03/22/21  1200  vancomycin (VANCOREADY) IVPB 1250 mg/250 mL  Status:  Discontinued        1,250 mg 166.7 mL/hr over 90 Minutes Intravenous Every 24 hours 03/21/21 1319 03/23/21 1133   03/20/21 1000  Glecaprevir-Pibrentasivir 100-40 mg (Mavyret) tabs 3 tablets - patient's own supply        3 tablet Oral Daily 03/20/21 0733     03/15/21 1200  vancomycin (VANCOREADY) IVPB 1500 mg/300 mL  Status:  Discontinued        1,500 mg 150 mL/hr over 120 Minutes Intravenous Every 24 hours 03/15/21 0732 03/21/21 1319   03/14/21 1200  vancomycin (VANCOREADY) IVPB 1250 mg/250 mL  Status:  Discontinued        1,250 mg 166.7 mL/hr over 90 Minutes Intravenous Every 24 hours 03/13/21 1359 03/15/21 0732   03/14/21 1023  valACYclovir (VALTREX) tablet 1,000 mg       Note to Pharmacy: Trig neuralgia flare     1,000 mg Oral 3 times daily PRN 03/14/21 1023     03/14/21 1000  abacavir-dolutegravir-lamiVUDine (TRIUMEQ) 600-50-300 MG per tablet 1 tablet  1 tablet Oral Daily 03/13/21 2320     03/14/21 0200  meropenem (MERREM) 1 g in sodium chloride 0.9 % 100 mL IVPB  Status:  Discontinued        1 g 200 mL/hr over 30 Minutes Intravenous Every 8 hours 03/13/21 2337 03/16/21 0719   03/14/21 0100  ceFEPIme (MAXIPIME) 2 g in sodium chloride 0.9 % 100 mL IVPB  Status:  Discontinued       Note to Pharmacy: Cefepime 2 g IV q12h for CrCl < 60 mL/min   2 g 200 mL/hr over 30 Minutes Intravenous Every 12 hours 03/13/21 2326 03/13/21 2331   03/13/21 2000  piperacillin-tazobactam (ZOSYN) IVPB 3.375 g  Status:  Discontinued        3.375 g 12.5 mL/hr over 240 Minutes Intravenous Every 8 hours 03/13/21 1402 03/13/21 2320   03/13/21 1430  clindamycin (CLEOCIN) IVPB 900 mg        900 mg 100 mL/hr over 30 Minutes Intravenous  Once 03/13/21 1418 03/13/21 1548   03/13/21 1230  vancomycin (VANCOCIN) IVPB 1000 mg/200 mL premix        1,000 mg 200 mL/hr over 60 Minutes Intravenous  Once 03/13/21 1229 03/13/21 1506   03/13/21 1230   piperacillin-tazobactam (ZOSYN) IVPB 3.375 g        3.375 g 100 mL/hr over 30 Minutes Intravenous  Once 03/13/21 1229 03/13/21 1400       Jolinda Pinkstaff M.D on 03/27/2021 at 12:42 PM  To page go to [email or web address]   [facility] -  Office  [phone]    See all Orders from today for further details    Objective:   Vitals:   03/26/21 0406 03/26/21 1210 03/26/21 1722 03/26/21 2100  BP: 105/85 122/75 121/76 140/84  Pulse: 80 79 91   Resp: 20 17 18 20   Temp: 98.4 F (36.9 C) 98.2 F (36.8 C) 98.3 F (36.8 C)   TempSrc: Axillary Oral Oral Oral  SpO2: 98% 100% 98%   Weight:      Height:        Wt Readings from Last 3 Encounters:  03/25/21 85.2 kg  03/09/21 77.1 kg  01/22/21 72.6 kg     Intake/Output Summary (Last 24 hours) at 03/27/2021 1242 Last data filed at 03/27/2021 0900 Gross per 24 hour  Intake --  Output 600 ml  Net -600 ml   Physical Exam  Awake Alert, Oriented X 3, No new F.N deficits, Normal affect Symmetrical Chest wall movement, Good air movement bilaterally, wound VAC to right chest area. RRR,No Gallops,Rubs or new Murmurs, No Parasternal Heave +ve B.Sounds, Abd Soft, No tenderness, No rebound - guarding or rigidity. No Cyanosis, Clubbing or edema, No new Rash or bruise      Data Review:    CBC Recent Labs  Lab 03/21/21 0252 03/23/21 0203 03/24/21 0542 03/25/21 0032 03/26/21 0329  WBC 8.8 5.7 8.0 6.5 5.4  HGB 10.1* 10.3* 10.2* 10.2* 9.5*  HCT 29.8* 30.9* 31.2* 31.4* 29.4*  PLT 289 314 391 364 332  MCV 96.4 98.1 100.0 99.4 99.3  MCH 32.7 32.7 32.7 32.3 32.1  MCHC 33.9 33.3 32.7 32.5 32.3  RDW 12.9 13.2 13.3 13.3 13.4  LYMPHSABS 1.7 1.5 1.7 1.5 1.4  MONOABS 0.8 0.6 0.7 0.7 0.6  EOSABS 0.1 0.1 0.3 0.2 0.2  BASOSABS 0.0 0.0 0.1 0.0 0.0    Recent Labs  Lab 03/21/21 0252 03/23/21 0203 03/24/21 0542 03/25/21 0032 03/26/21 0329  NA 132* 134* 136 137  137  K 4.3 4.5 4.8 4.5 4.7  CL 97* 97* 97* 97* 99  CO2 27 30 34* 33* 32   GLUCOSE 111* 127* 130* 173* 207*  BUN 23 23 28* 29* 23  CREATININE 1.21 1.20 1.29* 1.36* 1.28*  CALCIUM 8.3* 8.7* 9.1 8.9 8.7*  AST 14* 15 15 13* 16  ALT 9 9 10 10 10   ALKPHOS 81 80 88 82 81  BILITOT 0.6 0.6 0.6 0.5 0.6  ALBUMIN 2.1* 2.3* 2.4* 2.4* 2.3*  MG 2.0 2.0 2.0 1.9 1.9  CRP  --  2.7* 2.6* 2.5* 2.5*  PROCALCITON 0.10  --   --   --   --   BNP  --  84.1 37.7 27.3 40.5    ------------------------------------------------------------------------------------------------------------------ No results for input(s): CHOL, HDL, LDLCALC, TRIG, CHOLHDL, LDLDIRECT in the last 72 hours.  Lab Results  Component Value Date   HGBA1C 8.3 (H) 03/14/2021   ------------------------------------------------------------------------------------------------------------------ No results for input(s): TSH, T4TOTAL, T3FREE, THYROIDAB in the last 72 hours.  Invalid input(s): FREET3  Cardiac Enzymes No results for input(s): CKMB, TROPONINI, MYOGLOBIN in the last 168 hours.  Invalid input(s): CK ------------------------------------------------------------------------------------------------------------------    Component Value Date/Time   BNP 40.5 03/26/2021 0329    Micro Results Recent Results (from the past 240 hour(s))  Aerobic/Anaerobic Culture w Gram Stain (surgical/deep wound)     Status: None   Collection Time: 03/18/21  1:38 PM   Specimen: Abscess  Result Value Ref Range Status   Specimen Description ABSCESS  Final   Special Requests DRAIN CHEST WALL  Final   Gram Stain   Final    ABUNDANT WBC PRESENT, PREDOMINANTLY PMN ABUNDANT GRAM POSITIVE COCCI IN CLUSTERS    Culture   Final    ABUNDANT METHICILLIN RESISTANT STAPHYLOCOCCUS AUREUS NO ANAEROBES ISOLATED Performed at [facility], [address] [place], [place] [address]    Report Status 03/24/2021 FINAL  Final   Organism ID, Bacteria METHICILLIN RESISTANT STAPHYLOCOCCUS AUREUS  Final      Susceptibility    Methicillin resistant staphylococcus aureus - MIC*    CIPROFLOXACIN >=8 RESISTANT Resistant     ERYTHROMYCIN >=8 RESISTANT Resistant     GENTAMICIN <=0.5 SENSITIVE Sensitive     OXACILLIN >=4 RESISTANT Resistant     TETRACYCLINE <=1 SENSITIVE Sensitive     VANCOMYCIN <=0.5 SENSITIVE Sensitive     TRIMETH/SULFA >=320 RESISTANT Resistant     CLINDAMYCIN >=8 RESISTANT Resistant     RIFAMPIN <=0.5 SENSITIVE Sensitive     Inducible Clindamycin NEGATIVE Sensitive     * ABUNDANT METHICILLIN RESISTANT STAPHYLOCOCCUS AUREUS  Fungus Culture With Stain     Status: None (Preliminary result)   Collection Time: 03/19/21  3:07 PM   Specimen: Wound; Abscess  Result Value Ref Range Status   Fungus Stain Final report  Final    Comment: (NOTE) Performed At: [facility] [place], [place] [id number] Rush Farmer MD TD:[id number]    Fungus (Mycology) Culture PENDING  Incomplete   Fungal Source ABSCESS  Final    Comment: RIGHT CHEST Performed at [facility], [place] [address], [place], [place] [address]   Aerobic/Anaerobic Culture w Gram Stain (surgical/deep wound)     Status: None   Collection Time: 03/19/21  3:07 PM   Specimen: Wound; Abscess  Result Value Ref Range Status   Specimen Description ABSCESS RIGHT CHEST  Final   Special Requests NONE  Final   Gram Stain   Final  FEW WBC PRESENT, PREDOMINANTLY PMN FEW GRAM POSITIVE COCCI IN PAIRS IN CLUSTERS    Culture   Final    FEW METHICILLIN RESISTANT STAPHYLOCOCCUS AUREUS NO ANAEROBES ISOLATED Performed at [facility], [place] [address], [place], [place] [address]    Report Status 03/24/2021 FINAL  Final   Organism ID, Bacteria METHICILLIN RESISTANT STAPHYLOCOCCUS AUREUS  Final      Susceptibility   Methicillin resistant staphylococcus aureus - MIC*    CIPROFLOXACIN >=8 RESISTANT Resistant     ERYTHROMYCIN >=8 RESISTANT Resistant     GENTAMICIN <=0.5 SENSITIVE Sensitive     OXACILLIN >=4 RESISTANT  Resistant     TETRACYCLINE <=1 SENSITIVE Sensitive     VANCOMYCIN 1 SENSITIVE Sensitive     TRIMETH/SULFA >=320 RESISTANT Resistant     CLINDAMYCIN >=8 RESISTANT Resistant     RIFAMPIN <=0.5 SENSITIVE Sensitive     Inducible Clindamycin NEGATIVE Sensitive     * FEW METHICILLIN RESISTANT STAPHYLOCOCCUS AUREUS  Fungus Culture Result     Status: None   Collection Time: 03/19/21  3:07 PM  Result Value Ref Range Status   Result 1 Comment  Final    Comment: (NOTE) KOH/Calcofluor preparation:  no fungus observed. Performed At: [facility] [place], [place] [id number] Rush Farmer MD OE:[id number]     Radiology Reports CT CHEST W CONTRAST  Result Date: 03/22/2021 CLINICAL DATA:  70 year old male with chest pain and shortness of breath. EXAM: CT CHEST WITH CONTRAST TECHNIQUE: Multidetector CT imaging of the chest was performed during intravenous contrast administration. CONTRAST:  65mL OMNIPAQUE IOHEXOL 300 MG/ML  SOLN COMPARISON:  Chest CT dated 03/17/2021. FINDINGS: Cardiovascular: There is no cardiomegaly or pericardial effusion. There is 3 vessel coronary vascular calcification. There is mild atherosclerotic calcification of the thoracic aorta. No aneurysmal dilatation or dissection. The origins of the great vessels of the aortic arch appear patent as visualized. No pulmonary artery embolus identified. Mediastinum/Nodes: No hilar or mediastinal adenopathy. The esophagus is grossly unremarkable. No mediastinal fluid collection. Lungs/Pleura: Small bilateral pleural effusions with partial compressive atelectasis of the lower lobes. Pneumonia is not excluded. Clinical correlation is recommended. No lobar consolidation or pneumothorax. The central airways are patent. Upper Abdomen: No acute abnormality. Musculoskeletal: Degenerative changes of the spine. There is an area of inflammatory changes with small pockets of air in the right anterior chest wall along the anterior right  sixth rib. There is fragmentation of the anterior right sixth rib concerning for osteomyelitis. Clinical correlation is recommended. No drainable fluid collection or abscess identified. There is thickening of the adjacent pleura. There is a large skin wound in the lower right anterior chest wall with a wound VAC. IMPRESSION: 1. No CT evidence of pulmonary embolism. 2. Small bilateral pleural effusions with partial compressive atelectasis of the lower lobes. Pneumonia is not excluded. Clinical correlation is recommended. 3. Large skin wound in the lower right anterior chest wall with a wound VAC. 4. Destructive changes of the anterior right sixth rib concerning for osteomyelitis. Clinical correlation is recommended. No drainable fluid collection or abscess. 5. Aortic Atherosclerosis (ICD10-I70.0). Electronically Signed   By: Anner Crete M.D.   On: 03/22/2021 19:48   CT CHEST W CONTRAST  Result Date: 03/17/2021 CLINICAL DATA:  Infected right chest wall hematoma EXAM: CT CHEST WITH CONTRAST TECHNIQUE: Multidetector CT imaging of the chest was performed during intravenous contrast administration. CONTRAST:  75 mL OMNIPAQUE IOHEXOL 350 MG/ML SOLN COMPARISON:  03/13/2021 FINDINGS: Cardiovascular: Heart size within normal  limits. Coronary artery calcifications seen throughout. No significant vascular abnormality identified. Mediastinum/Nodes: No enlarged mediastinal, hilar, or axillary lymph nodes. Lungs/Pleura: Trace bilateral pleural effusions with adjacent atelectasis. Upper Abdomen: No acute abnormality. Musculoskeletal: Previously seen right chest wall collection has decreased in size since prior examination, measuring approximately 7.4 x 7.1 x 3.7 cm compared to 10.1 x 9.1 x 5.7 cm on prior exam from 03/13/2021. The collection is somewhat bilobed. The inferior portion of the right chest wall collection has decreased to a greater degree than the superior lobulation. Greater amount of air within the collection  likely due to presence of drains. Lucency and irregularity of the right sixth and seventh ribs, anterior segment again noted. IMPRESSION: 1. Interval decrease in size of bilobed right anterolateral chest wall fluid collection. The inferior lobulation is nearly completely resolved with some fat stranding and subcutaneous emphysema remaining. The more superior lobulation has decreased in size since prior study from 03/13/2021. The overall size of the collections now measures 7.4 x 7.1 x 3.7 cm compared to 10.1 x 9.1 x 5.7 cm on 03/13/2021. 2. Lucency in the anterior segments of the right sixth and seventh ribs again seen. Findings are suspicious for osteomyelitis, however underlying pathologic fracture is also possible. Electronically Signed   By: Miachel Roux M.D.   On: 03/17/2021 14:05   CT Chest W Contrast  Result Date: 03/13/2021 CLINICAL DATA:  Follow-up right chest wall hematoma and fractures of the right sixth through eighth ribs. Diabetes. HIV. EXAM: CT CHEST WITH CONTRAST TECHNIQUE: Multidetector CT imaging of the chest was performed during intravenous contrast administration. CONTRAST:  181mL OMNIPAQUE IOHEXOL 300 MG/ML  SOLN COMPARISON:  03/09/2021 FINDINGS: Cardiovascular: Heart size remains normal. No pericardial fluid. Coronary artery calcification as seen previously. Aortic atherosclerotic calcification as seen previously. Mediastinum/Nodes: Normal Lungs/Pleura: Scarring/atelectasis at the lung bases left more than right unchanged since the previous study. Small areas of patchy density in the superior segment of the right lower lobe unchanged from the study of 4 days ago. No new or progressive pulmonary finding. No pneumothorax. Upper Abdomen: No upper abdominal injury or acute finding. Musculoskeletal: There is increasing size of the right chest wall collections, primarily centered around the right sixth and eighth anterolateral ribs. Those ribs show a pattern of irregular destruction that look  more like pathologic fractures due to infection rather than traumatic fractures. Air bubbles are now present within the collections. There is mild extrapleural involvement in side of the ribcage, without frank breakthrough into the pleural space. Most of the collections manifest external to the ribcage. No new areas of involvement are seen. IMPRESSION: Increasing size of the right chest wall collections, primarily centered around the right sixth and eighth anterolateral ribs, with development of air bubbles. Those ribs show a pattern of irregular destruction that have progressed and look more like pathologic fractures due to infection rather than traumatic fractures. Air bubbles are now present within the collections and they probably represent abscesses. There is mild extrapleural involvement on the inner side of the ribcage, without frank breakthrough into the pleural space. The majority of the collections are on the outer side of the ribcage. No new areas of involvement are seen. Case discussed with Dr. Regenia Skeeter at 1419 hours. Aortic Atherosclerosis (ICD10-I70.0). Electronically Signed   By: Nelson Chimes M.D.   On: 03/13/2021 14:19   CT Chest W Contrast  Result Date: 03/09/2021 CLINICAL DATA:  Fall 3 weeks prior with right rib fractures and persistent chest pain. EXAM: CT CHEST  WITH CONTRAST TECHNIQUE: Multidetector CT imaging of the chest was performed during intravenous contrast administration. CONTRAST:  51mL OMNIPAQUE IOHEXOL 300 MG/ML  SOLN COMPARISON:  Chest radiograph from earlier today. 11/22/2019 chest CT angiogram. FINDINGS: Cardiovascular: Normal heart size. No significant pericardial effusion/thickening. Three-vessel coronary atherosclerosis. Atherosclerotic nonaneurysmal thoracic aorta. Top-normal caliber main pulmonary artery (3.0 cm diameter). No central pulmonary emboli. Mediastinum/Nodes: No discrete thyroid nodules. Unremarkable esophagus. No pathologically enlarged axillary, mediastinal or  hilar lymph nodes. Lungs/Pleura: No pneumothorax. No pleural effusion. No acute consolidative airspace disease, lung masses or significant pulmonary nodules. Mild platelike atelectasis in the anterior basilar left lower lobe. Mild patchy ground-glass opacity in peripheral posterior right mid lung. Upper abdomen: No acute abnormality. Musculoskeletal: No aggressive appearing focal osseous lesions. Nondisplaced acute anterior right sixth, seventh and eighth rib fractures with surrounding chest wall hematoma measuring up to 8.1 x 4.9 cm in maximum axial dimensions at the level of the anterior right sixth rib fracture (series 2/image 114). Moderate thoracic spondylosis. IMPRESSION: 1. Nondisplaced acute anterior right sixth, seventh and eighth rib fractures with surrounding chest wall hematoma. No pneumothorax or hemothorax. No discrete bone lesions. Clinical follow-up advised to ensure resolution of the right chest wall hematoma. Any need for follow-up imaging should be based on clinical assessment. 2. Mild patchy ground-glass opacity in the peripheral posterior right mid lung, nonspecific, favor mild pulmonary contusion. 3. Mild platelike atelectasis at the left lung base. 4. Three-vessel coronary atherosclerosis. 5. Aortic Atherosclerosis (ICD10-I70.0). Electronically Signed   By: Ilona Sorrel M.D.   On: 03/09/2021 13:03   IR US Guide Bx Asp/Drain  Result Date: 03/14/2021 INDICATION: 70 year old with history of fall and right rib fractures. Patient developed right chest hematomas. Patient is complaining of pain and recent CT demonstrates gas within the hematomas. Findings are concerning for infected hematomas. EXAM: PLACEMENT OF SUPERFICIAL RIGHT ANTERIOR CHEST WALL DRAIN USING ULTRASOUND GUIDANCE x 2 MEDICATIONS: Moderate sedation ANESTHESIA/SEDATION: Fentanyl 100 mcg IV; Versed 2.0 mg IV Moderate Sedation Time:  29 minutes The patient was continuously monitored during the procedure by the interventional  radiology nurse under my direct supervision. COMPLICATIONS: None immediate. PROCEDURE: Informed written consent was obtained from the patient after a thorough discussion of the procedural risks, benefits and alternatives. All questions were addressed. Maximal Sterile Barrier Technique was utilized including caps, mask, sterile gowns, sterile gloves, sterile drape, hand hygiene and skin antiseptic. A timeout was performed prior to the initiation of the procedure. The right anterior chest wall hematomas were evaluated with ultrasound. Complex collections containing a small amount of fluid were obtained. The right anterior chest was prepped with chlorhexidine and sterile field was created. Attention was initially directed to the more inferior or caudal anterior chest wall collection. Skin was anesthetized with 1% lidocaine. Using ultrasound guidance, an 18 gauge trocar needle was directed into the collection and pink purulent fluid was aspirated. Superstiff Amplatz wire was advanced into the complex collection and the tract was dilated to accommodate a 10 [place] drain. Catheter was sutured to the skin and attached to a suction bulb. A sample of fluid was sent for culture. A second area more cephalad was targeted with ultrasound guidance. Skin was anesthetized with 1% lidocaine. Using ultrasound guidance, an 18 gauge trocar needle was directed into the complex collection. Again, purulent fluid was aspirated. Superstiff Amplatz wire was advanced into this collection and a 10 French drain was placed. Additional purulent fluid was aspirated and the catheter was sutured to skin and attached to a suction bulb.  Dressings were placed over both drains. FINDINGS: Patient has palpable hematomas in the right anterior chest and upper abdomen region. Ultrasound demonstrates heterogeneous collections in both areas containing a small amount of compressible fluid. There are echogenic areas within the collections compatible with known  gas. 10 French drains were placed in both areas and purulent fluid was draining from both collections. Both collections are very complex and compatible with infected hematomas. IMPRESSION: Ultrasound-guided placement of 2 percutaneous drains within the superficial right anterior chest wall collections. Findings are compatible with infected hematomas. Fluid sample was sent for culture. Electronically Signed   By: Markus Daft M.D.   On: 03/14/2021 18:37   IR US Guide Bx Asp/Drain  Result Date: 03/14/2021 INDICATION: 70 year old with history of fall and right rib fractures. Patient developed right chest hematomas. Patient is complaining of pain and recent CT demonstrates gas within the hematomas. Findings are concerning for infected hematomas. EXAM: PLACEMENT OF SUPERFICIAL RIGHT ANTERIOR CHEST WALL DRAIN USING ULTRASOUND GUIDANCE x 2 MEDICATIONS: Moderate sedation ANESTHESIA/SEDATION: Fentanyl 100 mcg IV; Versed 2.0 mg IV Moderate Sedation Time:  29 minutes The patient was continuously monitored during the procedure by the interventional radiology nurse under my direct supervision. COMPLICATIONS: None immediate. PROCEDURE: Informed written consent was obtained from the patient after a thorough discussion of the procedural risks, benefits and alternatives. All questions were addressed. Maximal Sterile Barrier Technique was utilized including caps, mask, sterile gowns, sterile gloves, sterile drape, hand hygiene and skin antiseptic. A timeout was performed prior to the initiation of the procedure. The right anterior chest wall hematomas were evaluated with ultrasound. Complex collections containing a small amount of fluid were obtained. The right anterior chest was prepped with chlorhexidine and sterile field was created. Attention was initially directed to the more inferior or caudal anterior chest wall collection. Skin was anesthetized with 1% lidocaine. Using ultrasound guidance, an 18 gauge trocar needle was  directed into the collection and pink purulent fluid was aspirated. Superstiff Amplatz wire was advanced into the complex collection and the tract was dilated to accommodate a 10 [place] drain. Catheter was sutured to the skin and attached to a suction bulb. A sample of fluid was sent for culture. A second area more cephalad was targeted with ultrasound guidance. Skin was anesthetized with 1% lidocaine. Using ultrasound guidance, an 18 gauge trocar needle was directed into the complex collection. Again, purulent fluid was aspirated. Superstiff Amplatz wire was advanced into this collection and a 10 French drain was placed. Additional purulent fluid was aspirated and the catheter was sutured to skin and attached to a suction bulb. Dressings were placed over both drains. FINDINGS: Patient has palpable hematomas in the right anterior chest and upper abdomen region. Ultrasound demonstrates heterogeneous collections in both areas containing a small amount of compressible fluid. There are echogenic areas within the collections compatible with known gas. 10 French drains were placed in both areas and purulent fluid was draining from both collections. Both collections are very complex and compatible with infected hematomas. IMPRESSION: Ultrasound-guided placement of 2 percutaneous drains within the superficial right anterior chest wall collections. Findings are compatible with infected hematomas. Fluid sample was sent for culture. Electronically Signed   By: Markus Daft M.D.   On: 03/14/2021 18:37   DG Chest Port 1 View  Result Date: 03/13/2021 CLINICAL DATA:  Sepsis, right rib pain. EXAM: PORTABLE CHEST 1 VIEW COMPARISON:  March 09, 2021. FINDINGS: The heart size and mediastinal contours are within normal limits. Both lungs are  clear. No pneumothorax or pleural effusion is noted. The visualized skeletal structures are unremarkable. IMPRESSION: No active disease. Electronically Signed   By: Marijo Conception M.D.   On:  03/13/2021 14:11   DG Chest Portable 1 View  Result Date: 03/09/2021 CLINICAL DATA:  Chest pain. Chest Arctic hurting late yesterday. Complains of right-sided sternal pain and chest pain. EXAM: PORTABLE CHEST 1 VIEW COMPARISON:  None. FINDINGS: The heart size and mediastinal contours are within normal limits. Both lungs are clear. Blunting of the left costophrenic angle is noted, new from prior studies. The visualized skeletal structures are unremarkable. IMPRESSION: New blunting of left costophrenic angle may reflect small effusion. The lungs are otherwise clear. No signs of interstitial edema. Electronically Signed   By: Kerby Moors M.D.   On: 03/09/2021 11:26   [place] IMAGE GUIDED DRAINAGE BY PERCUTANEOUS CATHETER  Result Date: 03/18/2021 INDICATION: History of right-sided rib fractures complicated by development of infected adjacent hematomas, post ultrasound-guided placement of two percutaneous drainage catheters on 03/14/2021. Unfortunately, one of the percutaneous drainage catheters was inadvertently removed with subsequent chest CT performed 03/17/2021 demonstrating a persistent undrained complex fluid collection involving the anterior aspect of the right chest wall. As such, request made for ultrasound-guided aspiration and/or drainage catheter placement for infection source control purposes. EXAM: 1. ULTRASOUND-GUIDED RIGHT CHEST WALL PERCUTANEOUS DRAINAGE CATHETER PLACEMENT X2 2. ULTRASOUND-GUIDED RIGHT CHEST WALL ABSCESS ASPIRATION COMPARISON:  Chest CT-03/17/2021; 03/13/2021; ultrasound-guided right chest wall percutaneous drainage catheter placement x2-03/14/2021 MEDICATIONS: The patient is currently admitted to the hospital and receiving intravenous antibiotics. The antibiotics were administered within an appropriate time frame prior to the initiation of the procedure. ANESTHESIA/SEDATION: Moderate (conscious) sedation was employed during this procedure. A total of Versed 4 mg and Fentanyl 200  mcg was administered intravenously. Moderate Sedation Time: 41 minutes. The patient's level of consciousness and vital signs were monitored continuously by radiology nursing throughout the procedure under my direct supervision. CONTRAST:  None COMPLICATIONS: None immediate. PROCEDURE: Informed written consent was obtained from the patient after a discussion of the risks, benefits and alternatives to treatment. Preprocedural ultrasound scanning demonstrated complex fluid involving the anterior aspect of the right chest wall compatible with the findings seen on preceding chest CT. A timeout was performed prior to the initiation of the procedure. The skin overlying the right anterior chest was prepped and draped in the usual sterile fashion. The overlying soft tissues were anesthetized with 1% lidocaine with epinephrine. Under direct ultrasound guidance, a 18 gauge trocar needle was advanced into the complex abscess within the right chest wall, inferior to the nipple. A short Amplatz wire was coiled within the collection. The track was dilated ultimately allowing placement of a 10 French percutaneous catheter. Multiple ultrasound images were saved procedural documentation purposes. Next, approximately 10 cc of purulent fluid was aspirated from the drainage catheter Sonographic evaluation demonstrates a residual complex fluid collection and as such the more medial component of the collection was accessed with an 18 gauge trocar needle. A short Amplatz wire was coiled within the collection and an additional 10 French percutaneous drainage catheter was placed under ultrasound guidance. Multiple ultrasound images were saved procedural documentation purposes. Next, approximately 30 cc of purulent fluid was aspirated from this more medially positioned chest wall drain. A representative sample was capped and sent to the laboratory for analysis. Next, the lateral component of the right chest wall collection was accessed with  an 18 gauge trocar needle however only a small amount of bloody fluid  was able to be aspirated. As such, a short Amplatz wire was coiled within the collection and the trocar needle was exchanged for a Yueh sheath catheter which was utilized to aspirate approximately 3 cc of thick bloody fluid. At this time, approximately 70 cc of additional blood-tinged purulent fluid was able to be expressed from the site of the previously removed percutaneous drainage catheter. Drainage catheters were secured at the entrance site within interrupted sutures and drainage catheters were connected to JP bulbs. Dressings were applied. The patient tolerated the procedure well without immediate postprocedural complication. IMPRESSION: 1. Successful ultrasound-guided placement of 2 two additional right anterior chest wall percutaneous drainage catheters yielding a total of 40 cc of purulent fluid. A representative aspirated sample was sent to the laboratory for analysis. 2. Successful ultrasound-guided aspiration of approximately 3 cc of thick bloody fluid from the more lateral component of the right anterior chest wall complex hematoma. 3. Successful bedside expression of approximately 70 cc of purulent fluid from the entrance site of the recently removed percutaneous drainage catheter. Electronically Signed   By: Sandi Mariscal M.D.   On: 03/18/2021 15:29   [place] IMAGE GUIDED DRAINAGE BY PERCUTANEOUS CATHETER  Result Date: 03/18/2021 INDICATION: History of right-sided rib fractures complicated by development of infected adjacent hematomas, post ultrasound-guided placement of two percutaneous drainage catheters on 03/14/2021. Unfortunately, one of the percutaneous drainage catheters was inadvertently removed with subsequent chest CT performed 03/17/2021 demonstrating a persistent undrained complex fluid collection involving the anterior aspect of the right chest wall. As such, request made for ultrasound-guided aspiration and/or drainage  catheter placement for infection source control purposes. EXAM: 1. ULTRASOUND-GUIDED RIGHT CHEST WALL PERCUTANEOUS DRAINAGE CATHETER PLACEMENT X2 2. ULTRASOUND-GUIDED RIGHT CHEST WALL ABSCESS ASPIRATION COMPARISON:  Chest CT-03/17/2021; 03/13/2021; ultrasound-guided right chest wall percutaneous drainage catheter placement x2-03/14/2021 MEDICATIONS: The patient is currently admitted to the hospital and receiving intravenous antibiotics. The antibiotics were administered within an appropriate time frame prior to the initiation of the procedure. ANESTHESIA/SEDATION: Moderate (conscious) sedation was employed during this procedure. A total of Versed 4 mg and Fentanyl 200 mcg was administered intravenously. Moderate Sedation Time: 41 minutes. The patient's level of consciousness and vital signs were monitored continuously by radiology nursing throughout the procedure under my direct supervision. CONTRAST:  None COMPLICATIONS: None immediate. PROCEDURE: Informed written consent was obtained from the patient after a discussion of the risks, benefits and alternatives to treatment. Preprocedural ultrasound scanning demonstrated complex fluid involving the anterior aspect of the right chest wall compatible with the findings seen on preceding chest CT. A timeout was performed prior to the initiation of the procedure. The skin overlying the right anterior chest was prepped and draped in the usual sterile fashion. The overlying soft tissues were anesthetized with 1% lidocaine with epinephrine. Under direct ultrasound guidance, a 18 gauge trocar needle was advanced into the complex abscess within the right chest wall, inferior to the nipple. A short Amplatz wire was coiled within the collection. The track was dilated ultimately allowing placement of a 10 French percutaneous catheter. Multiple ultrasound images were saved procedural documentation purposes. Next, approximately 10 cc of purulent fluid was aspirated from the  drainage catheter Sonographic evaluation demonstrates a residual complex fluid collection and as such the more medial component of the collection was accessed with an 18 gauge trocar needle. A short Amplatz wire was coiled within the collection and an additional 10 French percutaneous drainage catheter was placed under ultrasound guidance. Multiple ultrasound images were saved procedural documentation purposes.  Next, approximately 30 cc of purulent fluid was aspirated from this more medially positioned chest wall drain. A representative sample was capped and sent to the laboratory for analysis. Next, the lateral component of the right chest wall collection was accessed with an 18 gauge trocar needle however only a small amount of bloody fluid was able to be aspirated. As such, a short Amplatz wire was coiled within the collection and the trocar needle was exchanged for a Yueh sheath catheter which was utilized to aspirate approximately 3 cc of thick bloody fluid. At this time, approximately 70 cc of additional blood-tinged purulent fluid was able to be expressed from the site of the previously removed percutaneous drainage catheter. Drainage catheters were secured at the entrance site within interrupted sutures and drainage catheters were connected to JP bulbs. Dressings were applied. The patient tolerated the procedure well without immediate postprocedural complication. IMPRESSION: 1. Successful ultrasound-guided placement of 2 two additional right anterior chest wall percutaneous drainage catheters yielding a total of 40 cc of purulent fluid. A representative aspirated sample was sent to the laboratory for analysis. 2. Successful ultrasound-guided aspiration of approximately 3 cc of thick bloody fluid from the more lateral component of the right anterior chest wall complex hematoma. 3. Successful bedside expression of approximately 70 cc of purulent fluid from the entrance site of the recently removed percutaneous  drainage catheter. Electronically Signed   By: Sandi Mariscal M.D.   On: 03/18/2021 15:29   [place] FINE NEEDLE ASP 1ST LESION  Result Date: 03/18/2021 INDICATION: History of right-sided rib fractures complicated by development of infected adjacent hematomas, post ultrasound-guided placement of two percutaneous drainage catheters on 03/14/2021. Unfortunately, one of the percutaneous drainage catheters was inadvertently removed with subsequent chest CT performed 03/17/2021 demonstrating a persistent undrained complex fluid collection involving the anterior aspect of the right chest wall. As such, request made for ultrasound-guided aspiration and/or drainage catheter placement for infection source control purposes. EXAM: 1. ULTRASOUND-GUIDED RIGHT CHEST WALL PERCUTANEOUS DRAINAGE CATHETER PLACEMENT X2 2. ULTRASOUND-GUIDED RIGHT CHEST WALL ABSCESS ASPIRATION COMPARISON:  Chest CT-03/17/2021; 03/13/2021; ultrasound-guided right chest wall percutaneous drainage catheter placement x2-03/14/2021 MEDICATIONS: The patient is currently admitted to the hospital and receiving intravenous antibiotics. The antibiotics were administered within an appropriate time frame prior to the initiation of the procedure. ANESTHESIA/SEDATION: Moderate (conscious) sedation was employed during this procedure. A total of Versed 4 mg and Fentanyl 200 mcg was administered intravenously. Moderate Sedation Time: 41 minutes. The patient's level of consciousness and vital signs were monitored continuously by radiology nursing throughout the procedure under my direct supervision. CONTRAST:  None COMPLICATIONS: None immediate. PROCEDURE: Informed written consent was obtained from the patient after a discussion of the risks, benefits and alternatives to treatment. Preprocedural ultrasound scanning demonstrated complex fluid involving the anterior aspect of the right chest wall compatible with the findings seen on preceding chest CT. A timeout was performed  prior to the initiation of the procedure. The skin overlying the right anterior chest was prepped and draped in the usual sterile fashion. The overlying soft tissues were anesthetized with 1% lidocaine with epinephrine. Under direct ultrasound guidance, a 18 gauge trocar needle was advanced into the complex abscess within the right chest wall, inferior to the nipple. A short Amplatz wire was coiled within the collection. The track was dilated ultimately allowing placement of a 10 French percutaneous catheter. Multiple ultrasound images were saved procedural documentation purposes. Next, approximately 10 cc of purulent fluid was aspirated from the drainage catheter Sonographic evaluation  demonstrates a residual complex fluid collection and as such the more medial component of the collection was accessed with an 18 gauge trocar needle. A short Amplatz wire was coiled within the collection and an additional 10 French percutaneous drainage catheter was placed under ultrasound guidance. Multiple ultrasound images were saved procedural documentation purposes. Next, approximately 30 cc of purulent fluid was aspirated from this more medially positioned chest wall drain. A representative sample was capped and sent to the laboratory for analysis. Next, the lateral component of the right chest wall collection was accessed with an 18 gauge trocar needle however only a small amount of bloody fluid was able to be aspirated. As such, a short Amplatz wire was coiled within the collection and the trocar needle was exchanged for a Yueh sheath catheter which was utilized to aspirate approximately 3 cc of thick bloody fluid. At this time, approximately 70 cc of additional blood-tinged purulent fluid was able to be expressed from the site of the previously removed percutaneous drainage catheter. Drainage catheters were secured at the entrance site within interrupted sutures and drainage catheters were connected to JP bulbs. Dressings  were applied. The patient tolerated the procedure well without immediate postprocedural complication. IMPRESSION: 1. Successful ultrasound-guided placement of 2 two additional right anterior chest wall percutaneous drainage catheters yielding a total of 40 cc of purulent fluid. A representative aspirated sample was sent to the laboratory for analysis. 2. Successful ultrasound-guided aspiration of approximately 3 cc of thick bloody fluid from the more lateral component of the right anterior chest wall complex hematoma. 3. Successful bedside expression of approximately 70 cc of purulent fluid from the entrance site of the recently removed percutaneous drainage catheter. Electronically Signed   By: Sandi Mariscal M.D.   On: 03/18/2021 15:29

## 2021-03-27 NOTE — Progress Notes (Addendum)
Informed patient that I would be taking over his care. Medicated patient, and informed patient that we need to do wound vac dressing change. Patient refused for this nurse to change his wound vac. Patient then requested to see our policy and procedures about changing the wound vac and to see this nurse check off. Informed the patient that we do not give that information out. Patient then refused for me to change his dressing again. Informed surgery, waiting on reply.

## 2021-03-27 NOTE — Progress Notes (Signed)
[facility] Infectious Diseases Follow Up Note  Patient Identification: Patient Name: Brian Malone MRN: [id number] [place] Date: 03/13/2021 11:58 AM Age: 70 y.o.Today's Date: 03/27/2021   Reason for Visit: Follow up on chest wall abscess  Principal Problem:   Sepsis ([place]) Active Problems:   HIV (human immunodeficiency virus infection) ([place])   Anxiety disorder   Essential hypertension   AKI (acute kidney injury) ([place])   Lactic acidosis   Abscess of chest wall   Mixed diabetic hyperlipidemia associated with type 2 diabetes mellitus ([place])   GERD without esophagitis   BPH (benign prostatic hyperplasia)   Chronic hepatitis C without hepatic coma (HCC)   Protein-calorie malnutrition, severe   Osteomyelitis (HCC)   Antibiotics: Vancomycin 3/25-c  Triumeq Mavyret   Interval Events: Continues to remain afebrile, no leukocytosis   Assessment Right chest wall abscess status post ultrasound-guided drain placement by IR 3/26 and chest wall debridement and wound VAC placement 3/31 by CV surgery.  Cultures positive for MRSA. Status post repeat chest wall debridement and wound VAC change 4/6.  Anterior right sixth rib osteomyelitis HIV well-controlled, HIV RNA undetectable, follows up with a provider at [facility], on [place] for 4 more weeks from 3/17.  HCVRNA 3/17 not detected.  Follows up with a provider at [facility] daptomycin,, monitor CPK -Daptomycin to be continued for 3 weeks from 3/31 to be followed by linezolid 600 mg p.o. twice daily upon discharge for 3 more weeks.  Patient is on buspirone 30 mg twice daily.  Low risk for serotonin syndrome given low-dose.   -Alternative option in place of Linezolid would be 1 dose of oritavancin followed by 2 more weeks of doxycycline upon discharge ( doxycyline to be started one week after dose of Oritavancin) -Wound vac  management per CTsx -Patient will follow up with [facility] for management of his HIV and hepatitis C -A follow-up with [facility] has been made for monitoring while on oral antibiotics. ( 5/6 at 1:45 pm with myself at [facility]) -Will sign off for now.  Rest of the management as per the primary team. Thank you for the consult. Please page with pertinent questions or concerns.  ______________________________________________________________________ Subjective patient seen and examined at the bedside.  Resting comfortably in bed.  No new complaints.  Discussed with him about antibiotic plan.  He says he needs buspirone for maintenance of his anxiety.    Vitals BP 140/84 (BP Location: Left Arm)   Pulse 91   Temp 98.3 F (36.8 C) (Oral)   Resp 20   Ht 6' (1.829 m)   Wt 85.2 kg   SpO2 98%   BMI 25.47 kg/m     Physical Exam Not in acute distress, lying in bed No oral thrush Normal heart sounds, RRR Normal lung sounds Skin-wound VAC in the right chest wall, no edema or tenderness Abdomen soft and nontender Extremities no pedal edema   Pertinent Microbiology Results for orders placed or performed during the hospital encounter of 03/13/21  Urine culture     Status: None   Collection Time: 03/13/21 12:25 AM   Specimen: In/Out Cath Urine  Result Value Ref Range Status   Specimen Description IN/OUT CATH URINE  Final   Special Requests NONE  Final   Culture   Final    NO GROWTH Performed at [facility], [address] [place], [place] [address]    Report Status 03/15/2021 FINAL  Final  Blood Culture (  routine x 2)     Status: None   Collection Time: 03/13/21 12:25 PM   Specimen: Right Antecubital; Blood  Result Value Ref Range Status   Specimen Description   Final    RIGHT ANTECUBITAL BLOOD Performed at [facility], [address] [place], [place] [address]    Special Requests   Final    BOTTLES DRAWN AEROBIC AND ANAEROBIC Blood Culture results may not be  optimal due to an inadequate volume of blood received in culture bottles Performed at [facility], [address] [place], [place] [address]    Culture   Final    NO GROWTH 5 DAYS Performed at [facility], [place] [address], [place], [place] [address]    Report Status 03/18/2021 FINAL  Final  Blood Culture (routine x 2)     Status: None   Collection Time: 03/13/21  1:15 PM   Specimen: BLOOD RIGHT HAND  Result Value Ref Range Status   Specimen Description   Final    BLOOD RIGHT HAND BLOOD Performed at [facility], [address] [place], [place] [address]    Special Requests   Final    BOTTLES DRAWN AEROBIC AND ANAEROBIC Blood Culture results may not be optimal due to an inadequate volume of blood received in culture bottles Performed at [facility], [address] [place], [place] [address]    Culture   Final    NO GROWTH 5 DAYS Performed at [facility], [place] [address], [place], [place] [address]    Report Status 03/18/2021 FINAL  Final  Resp Panel by RT-PCR (Flu A&B, Covid) Nasopharyngeal Swab     Status: None   Collection Time: 03/13/21  2:01 PM   Specimen: Nasopharyngeal Swab; Nasopharyngeal(NP) swabs in vial transport medium  Result Value Ref Range Status   SARS Coronavirus 2 by RT PCR NEGATIVE NEGATIVE Final    Comment: (NOTE) SARS-CoV-2 target nucleic acids are NOT DETECTED.  The SARS-CoV-2 RNA is generally detectable in upper respiratory specimens during the acute phase of infection. The lowest concentration of SARS-CoV-2 viral copies this assay can detect is 138 copies/mL. A negative result does not preclude SARS-Cov-2 infection and should not be used as the sole basis for treatment or other patient management decisions. A negative result may occur with  improper specimen collection/handling, submission of specimen other than nasopharyngeal swab, presence of viral mutation(s) within the areas targeted by this assay,  and inadequate number of viral copies(<138 copies/mL). A negative result must be combined with clinical observations, patient history, and epidemiological information. The expected result is Negative.  Fact Sheet for Patients:  EntrepreneurPulse.com.au  Fact Sheet for Healthcare Providers:  IncredibleEmployment.be  This test is no t yet approved or cleared by the [place] FDA and  has been authorized for detection and/or diagnosis of SARS-CoV-2 by FDA under an Emergency Use Authorization (EUA). This EUA will remain  in effect (meaning this test can be used) for the duration of the COVID-19 declaration under Section 564(b)(1) of the Act, 21 U.S.C.section 360bbb-3(b)(1), unless the authorization is terminated  or revoked sooner.       Influenza A by PCR NEGATIVE NEGATIVE Final   Influenza B by PCR NEGATIVE NEGATIVE Final    Comment: (NOTE) The Xpert Xpress SARS-CoV-2/FLU/RSV plus assay is intended as an aid in the diagnosis of influenza from Nasopharyngeal swab specimens and should not be used as a sole  basis for treatment. Nasal washings and aspirates are unacceptable for Xpert Xpress SARS-CoV-2/FLU/RSV testing.  Fact Sheet for Patients: EntrepreneurPulse.com.au  Fact Sheet for Healthcare Providers: IncredibleEmployment.be  This test is not yet approved or cleared by the [place] FDA and has been authorized for detection and/or diagnosis of SARS-CoV-2 by FDA under an Emergency Use Authorization (EUA). This EUA will remain in effect (meaning this test can be used) for the duration of the COVID-19 declaration under Section 564(b)(1) of the Act, 21 U.S.C. section 360bbb-3(b)(1), unless the authorization is terminated or revoked.  Performed at [facility], [address] [place], [place] [address]   MRSA PCR Screening     Status: Abnormal   Collection Time: 03/13/21 11:51 PM    Specimen: Nasal Mucosa; Nasopharyngeal  Result Value Ref Range Status   MRSA by PCR POSITIVE (A) NEGATIVE Final    Comment:        The GeneXpert MRSA Assay (FDA approved for NASAL specimens only), is one component of a comprehensive MRSA colonization surveillance program. It is not intended to diagnose MRSA infection nor to guide or monitor treatment for MRSA infections. RESULT CALLED TO, READ BACK BY AND VERIFIED WITHDorna Bloom RN 03/14/21 0432 JDW Performed at [facility] [address] [place], [place] [address]   Aerobic/Anaerobic Culture (surgical/deep wound)     Status: None   Collection Time: 03/14/21  1:23 PM   Specimen: Abscess  Result Value Ref Range Status   Specimen Description ABSCESS  Final   Special Requests NONE  Final   Gram Stain   Final    ABUNDANT WBC PRESENT, PREDOMINANTLY PMN ABUNDANT GRAM POSITIVE COCCI IN CLUSTERS    Culture   Final    ABUNDANT METHICILLIN RESISTANT STAPHYLOCOCCUS AUREUS NO ANAEROBES ISOLATED Performed at [facility], [address], [place], [place] [address]    Report Status 03/19/2021 FINAL  Final   Organism ID, Bacteria METHICILLIN RESISTANT STAPHYLOCOCCUS AUREUS  Final      Susceptibility   Methicillin resistant staphylococcus aureus - MIC*    CIPROFLOXACIN >=8 RESISTANT Resistant     ERYTHROMYCIN >=8 RESISTANT Resistant     GENTAMICIN <=0.5 SENSITIVE Sensitive     OXACILLIN >=4 RESISTANT Resistant     TETRACYCLINE <=1 SENSITIVE Sensitive     VANCOMYCIN <=0.5 SENSITIVE Sensitive     TRIMETH/SULFA >=320 RESISTANT Resistant     CLINDAMYCIN >=8 RESISTANT Resistant     RIFAMPIN <=0.5 SENSITIVE Sensitive     Inducible Clindamycin NEGATIVE Sensitive     * ABUNDANT METHICILLIN RESISTANT STAPHYLOCOCCUS AUREUS  Aerobic/Anaerobic Culture w Gram Stain (surgical/deep wound)     Status: None   Collection Time: 03/18/21  1:38 PM   Specimen: Abscess  Result Value Ref Range Status   Specimen Description ABSCESS  Final    Special Requests DRAIN CHEST WALL  Final   Gram Stain   Final    ABUNDANT WBC PRESENT, PREDOMINANTLY PMN ABUNDANT GRAM POSITIVE COCCI IN CLUSTERS    Culture   Final    ABUNDANT METHICILLIN RESISTANT STAPHYLOCOCCUS AUREUS NO ANAEROBES ISOLATED Performed at [facility], [address] [place], [place] [address]    Report Status 03/24/2021 FINAL  Final   Organism ID, Bacteria METHICILLIN RESISTANT STAPHYLOCOCCUS AUREUS  Final      Susceptibility   Methicillin resistant staphylococcus aureus - MIC*    CIPROFLOXACIN >=8 RESISTANT Resistant     ERYTHROMYCIN >=8 RESISTANT Resistant     GENTAMICIN <=  0.5 SENSITIVE Sensitive     OXACILLIN >=4 RESISTANT Resistant     TETRACYCLINE <=1 SENSITIVE Sensitive     VANCOMYCIN <=0.5 SENSITIVE Sensitive     TRIMETH/SULFA >=320 RESISTANT Resistant     CLINDAMYCIN >=8 RESISTANT Resistant     RIFAMPIN <=0.5 SENSITIVE Sensitive     Inducible Clindamycin NEGATIVE Sensitive     * ABUNDANT METHICILLIN RESISTANT STAPHYLOCOCCUS AUREUS  Fungus Culture With Stain     Status: None (Preliminary result)   Collection Time: 03/19/21  3:07 PM   Specimen: Wound; Abscess  Result Value Ref Range Status   Fungus Stain Final report  Final    Comment: (NOTE) Performed At: [facility] [place], [place] [id number] Rush Farmer MD ON:[id number]    Fungus (Mycology) Culture PENDING  Incomplete   Fungal Source ABSCESS  Final    Comment: RIGHT CHEST Performed at [facility], [place] [address] [place], [place] [address]   Aerobic/Anaerobic Culture w Gram Stain (surgical/deep wound)     Status: None   Collection Time: 03/19/21  3:07 PM   Specimen: Wound; Abscess  Result Value Ref Range Status   Specimen Description ABSCESS RIGHT CHEST  Final   Special Requests NONE  Final   Gram Stain   Final    FEW WBC PRESENT, PREDOMINANTLY PMN FEW GRAM POSITIVE COCCI IN PAIRS IN CLUSTERS    Culture   Final    FEW METHICILLIN RESISTANT  STAPHYLOCOCCUS AUREUS NO ANAEROBES ISOLATED Performed at [facility], [place] [address] [place],  [address]    Report Status 03/24/2021 FINAL  Final   Organism ID, Bacteria METHICILLIN RESISTANT STAPHYLOCOCCUS AUREUS  Final      Susceptibility   Methicillin resistant staphylococcus aureus - MIC*    CIPROFLOXACIN >=8 RESISTANT Resistant     ERYTHROMYCIN >=8 RESISTANT Resistant     GENTAMICIN <=0.5 SENSITIVE Sensitive     OXACILLIN >=4 RESISTANT Resistant     TETRACYCLINE <=1 SENSITIVE Sensitive     VANCOMYCIN 1 SENSITIVE Sensitive     TRIMETH/SULFA >=320 RESISTANT Resistant     CLINDAMYCIN >=8 RESISTANT Resistant     RIFAMPIN <=0.5 SENSITIVE Sensitive     Inducible Clindamycin NEGATIVE Sensitive     * FEW METHICILLIN RESISTANT STAPHYLOCOCCUS AUREUS  Fungus Culture Result     Status: None   Collection Time: 03/19/21  3:07 PM  Result Value Ref Range Status   Result 1 Comment  Final    Comment: (NOTE) KOH/Calcofluor preparation:  no fungus observed. Performed At: [facility] [place], [place] [id number] Rush Farmer MD QV:[id number]    Pertinent Lab. CBC Latest Ref Rng & Units 03/26/2021 03/25/2021 03/24/2021  WBC 4.0 - 10.5 K/uL 5.4 6.5 8.0  Hemoglobin 13.0 - 17.0 g/dL 9.5(L) 10.2(L) 10.2(L)  Hematocrit 39.0 - 52.0 % 29.4(L) 31.4(L) 31.2(L)  Platelets 150 - 400 K/uL 332 364 391   CMP Latest Ref Rng & Units 03/26/2021 03/25/2021 03/24/2021  Glucose 70 - 99 mg/dL 207(H) 173(H) 130(H)  BUN 8 - 23 mg/dL 23 29(H) 28(H)  Creatinine 0.61 - 1.24 mg/dL 1.28(H) 1.36(H) 1.29(H)  Sodium 135 - 145 mmol/L 137 137 136  Potassium 3.5 - 5.1 mmol/L 4.7 4.5 4.8  Chloride 98 - 111 mmol/L 99 97(L) 97(L)  CO2 22 - 32 mmol/L 32 33(H) 34(H)  Calcium 8.9 - 10.3 mg/dL 8.7(L) 8.9 9.1  Total Protein 6.5 - 8.1 g/dL 5.9(L) 6.2(L) 6.9  Total Bilirubin 0.3 - 1.2 mg/dL 0.6  0.5 0.6  Alkaline Phos 38 - 126 U/L 81 82 88  AST 15 - 41 U/L 16 13(L) 15  ALT 0 - 44 U/L 10 10 10       Pertinent Imaging today Plain films and CT images have been personally visualized and interpreted; radiology reports have been reviewed. Decision making incorporated into the Impression / Recommendations.  I have spent approx 30 minutes for this patient encounter including review of prior medical records, coordination of care  with greater than 50% of time being face to face/counseling and discussing diagnostics/treatment plan with the patient/family.  Electronically signed by:   Rosiland Oz, MD Infectious Disease Physician [facility] for Infectious Disease Pager: [phone]

## 2021-03-28 DIAGNOSIS — L02213 Cutaneous abscess of chest wall: Secondary | ICD-10-CM | POA: Diagnosis not present

## 2021-03-28 DIAGNOSIS — R6520 Severe sepsis without septic shock: Secondary | ICD-10-CM | POA: Diagnosis not present

## 2021-03-28 DIAGNOSIS — N179 Acute kidney failure, unspecified: Secondary | ICD-10-CM | POA: Diagnosis not present

## 2021-03-28 DIAGNOSIS — A419 Sepsis, unspecified organism: Secondary | ICD-10-CM | POA: Diagnosis not present

## 2021-03-28 LAB — GLUCOSE, CAPILLARY
Glucose-Capillary: 129 mg/dL — ABNORMAL HIGH (ref 70–99)
Glucose-Capillary: 150 mg/dL — ABNORMAL HIGH (ref 70–99)
Glucose-Capillary: 138 mg/dL — ABNORMAL HIGH (ref 70–99)
Glucose-Capillary: 173 mg/dL — ABNORMAL HIGH (ref 70–99)

## 2021-03-28 MED ORDER — DOCUSATE SODIUM 100 MG PO CAPS
100.0000 mg | ORAL_CAPSULE | Freq: Every day | ORAL | Status: DC | PRN
  Administered 2021-03-29 – 2021-04-02 (×5): 100 mg via ORAL
  Filled 2021-03-28 (×5): qty 1

## 2021-03-28 MED ORDER — FENTANYL 25 MCG/HR TD PT72
1.0000 | MEDICATED_PATCH | TRANSDERMAL | Status: DC
  Administered 2021-03-28 – 2021-04-09 (×5): 1 via TRANSDERMAL
  Filled 2021-03-28 (×5): qty 1

## 2021-03-28 MED ORDER — HYDROMORPHONE HCL 1 MG/ML IJ SOLN
0.5000 mg | INTRAMUSCULAR | Status: DC | PRN
Start: 2021-03-28 — End: 2021-03-30
  Administered 2021-03-28 – 2021-03-29 (×6): 0.5 mg via INTRAVENOUS
  Filled 2021-03-28 (×6): qty 0.5

## 2021-03-28 MED ORDER — HYDROMORPHONE HCL 1 MG/ML IJ SOLN
1.5000 mg | INTRAMUSCULAR | Status: AC | PRN
  Administered 2021-03-30 – 2021-04-03 (×2): 1.5 mg via INTRAVENOUS
  Filled 2021-03-28 (×2): qty 1.5

## 2021-03-28 NOTE — Progress Notes (Signed)
PROGRESS NOTE                                                                                                                                                                                                             Patient Demographics:    Brian Malone, is a 70 y.o. male, DOB - 21-Oct-1951, BSW:[id number]  Outpatient Primary MD for the patient is [name], [name], MD    LOS - 15  Admit date - 03/13/2021    Chief Complaint  Patient presents with  . Rib Injury       Brief Narrative (HPI from H&P)    70 year old male with past medical history of hepatitis C, hypertension, hyperlipidemia, diabetes mellitus type 2, chronic pain syndrome, intravenous drug use (abstinent since 2019), major depressive disorder and HIV who had a trip and fall in his yard on 02/22/2021 after which he hurt his right-sided chest wall, at that time he was diagnosed with right-sided rib cage fracture and injury, he was seen in the ER a few times since then and was treated conservatively, his pain and discomfort continued to get worse and he presented again to [facility] on  03/13/2021 CT scan suggested that he had right chest wall abscess formation and cellulitis with concerns for sepsis.,  Patient went for drain placement by IR with minimal improvement, seen by CT surgery, went to the OR 03/19/2021 with open incision and drainage and wound VAC, back to the OR 4/6 for wound VAC exchange and some debridement, remains on IV daptomycin.   Subjective:   Patient reports no fever, no chills, denies any constipation, reports pain with wound VAC dressing change.  .   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  - ID consulted, will need IV daptomycin total time 3 weeks from 03/19/2021,  then to continue with p.o. Zyvox as an outpatient till seen by ID clinic. - Encouraged the patient to sit up in chair in the daytime use I-S and flutter valve for pulmonary toiletry.  Will advance activity and titrate down oxygen as possible. -Discussed pain regimen with the patient, we will try to minimize IV Dilaudid, so we will start on fentanyl patch 25 mcg, will decrease Dilaudid where he can be weaned off it over the next 24 to 48 hours, meanwhile he is on as needed oxycodone as well, he reports significant pain with dressing changes, so he received IV Dilaudid prior to that.   Chronic pain and narcotic use.   Supportive care as needed for acute discomfort, avoid overuse, as needed Narcan added.  Patient counseled , please see above discussion  HIV - Continue home regimen follows with [facility] ID department.  Anxiety and depression.  Home medications continued.  Currently not homicidal or suicidal.  GERD.  On PPI.  BPH.  On Flomax.  AKI.  Due to ATN from sepsis, resolved after hydration, gentle hydration again on 03/24/2021.  Hypertension.  Blood pressure soft, blood pressure medicine has been discontinued.  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  On sliding scale monitor and adjust  Lab Results  Component Value Date   HGBA1C 8.3 (H) 03/14/2021   CBG (last 3)  Recent Labs    03/27/21 2045 03/28/21 0841 03/28/21 1158  GLUCAP 160* 129* 150*          Condition - Extremely Guarded  Family Communication  : None present bedside  Code Status :  Full  Consults  :  IR,  cardiothoracic surgery, ID  PUD Prophylaxis : PPI   Procedures  :     Right chest wall I&D by Dr. Kipp Brood 1/85/63 -Repeat application of wound VAC and incision and drainage on 4/6  US guided chest wall abscess drined by IR 03/14/21   CT -  Increasing size of the right chest wall collections, primarily centered around the right sixth and eighth anterolateral ribs, with development of air bubbles. Those ribs show a pattern of irregular destruction that have progressed and look more like pathologic fractures due to infection rather than traumatic fractures. Air bubbles are now present within the collections and they probably represent abscesses. There is mild extrapleural involvement on the inner side of the ribcage, without frank breakthrough into the pleural space. The majority of the collections are on the outer side of the ribcage. No new areas of involvement are seen.  CT repeat 03/17/21 - 1. Interval decrease in size of bilobed right anterolateral chest wall fluid collection. The inferior lobulation is nearly completely resolved with some fat stranding and subcutaneous emphysema remaining. The more superior lobulation has decreased in size since prior study from 03/13/2021. The overall size of the collections now measures 7.4 x 7.1 x 3.7 cm compared to 10.1 x 9.1 x 5.7 cm on 03/13/2021. 2. Lucency in the anterior segments of the right sixth and seventh ribs again seen. Findings are suspicious for osteomyelitis, however underlying pathologic fracture is also possible.      Disposition Plan  :    Status is: Inpatient  Remains inpatient appropriate because:IV treatments appropriate due to intensity of illness or inability to take PO   Dispo: The patient is from: Home              Anticipated d/c is to: Home  Patient currently is not medically stable to d/c.   Difficult to place patient No  DVT Prophylaxis  :   Heparin   Lab Results  Component Value Date   PLT 332  03/26/2021    Diet :  Diet Order            Diet Carb Modified Fluid consistency: Thin; Room service appropriate? Yes  Diet effective now                  Inpatient Medications  Scheduled Meds: . (feeding supplement) PROSource Plus  30 mL Oral TID WC  . abacavir-dolutegravir-lamiVUDine  1 tablet Oral Daily  . busPIRone  30 mg Oral BID  . clonazepam  0.5 mg Oral TID  . ezetimibe  10 mg Oral Daily  . feeding supplement  237 mL Oral BID BM  . feeding supplement (GLUCERNA SHAKE)  237 mL Oral Q24H  . fentaNYL  1 patch Transdermal Q72H  . Glecaprevir-Pibrentasvir  3 tablet Oral Daily  . heparin injection (subcutaneous)  5,000 Units Subcutaneous Q8H  . insulin aspart  0-15 Units Subcutaneous TID AC & HS  . [START ON 04/10/2021] linezolid  600 mg Oral Q12H  . metoprolol tartrate  25 mg Oral BID  . multivitamin with minerals  1 tablet Oral Daily  . pantoprazole  40 mg Oral QAC breakfast  . tamsulosin  0.4 mg Oral QHS  . tiZANidine  2 mg Oral QHS   Continuous Infusions: . sodium chloride 50 mL/hr at 03/24/21 2342  . DAPTOmycin (CUBICIN)  IV 700 mg (03/27/21 2202)  . lactated ringers 10 mL/hr at 03/25/21 1007   PRN Meds:.sodium chloride, acetaminophen **OR** [DISCONTINUED] acetaminophen, antiseptic oral rinse, docusate sodium, HYDROmorphone (DILAUDID) injection **OR** [DISCONTINUED]  HYDROmorphone (DILAUDID) injection, HYDROmorphone (DILAUDID) injection, naLOXone (NARCAN)  injection, [DISCONTINUED] ondansetron **OR** ondansetron (ZOFRAN) IV, oxyCODONE-acetaminophen, polyethylene glycol, valACYclovir  Antibiotics  :    Anti-infectives (From admission, onward)   Start     Dose/Rate Route Frequency Ordered Stop   04/10/21 1000  linezolid (ZYVOX) tablet 600 mg        600 mg Oral Every 12 hours 03/27/21 1410 05/01/21 0959   03/27/21 0000  linezolid (ZYVOX) 600 MG tablet       Note to Pharmacy: Patient staying through next week for IV abx - Just working ahead.   600 mg Oral 2 times  daily 03/27/21 0933 04/17/21 2359   03/23/21 2000  DAPTOmycin (CUBICIN) 700 mg in sodium chloride 0.9 % IVPB        8 mg/kg  85.2 kg 128 mL/hr over 30 Minutes Intravenous Every 24 hours 03/23/21 1133 04/09/21 2359   03/22/21 1200  vancomycin (VANCOREADY) IVPB 1250 mg/250 mL  Status:  Discontinued        1,250 mg 166.7 mL/hr over 90 Minutes Intravenous Every 24 hours 03/21/21 1319 03/23/21 1133   03/20/21 1000  Glecaprevir-Pibrentasivir 100-40 mg (Mavyret) tabs 3 tablets - patient's own supply        3 tablet Oral Daily 03/20/21 0733     03/15/21 1200  vancomycin (VANCOREADY) IVPB 1500 mg/300 mL  Status:  Discontinued        1,500 mg 150 mL/hr over 120 Minutes Intravenous Every 24 hours 03/15/21 0732 03/21/21 1319   03/14/21 1200  vancomycin (VANCOREADY) IVPB 1250 mg/250 mL  Status:  Discontinued        1,250 mg 166.7 mL/hr over 90 Minutes Intravenous Every 24 hours 03/13/21 1359 03/15/21 0732   03/14/21 1023  valACYclovir (VALTREX) tablet 1,000 mg       Note to Pharmacy: Trig neuralgia flare     1,000 mg Oral 3 times daily PRN 03/14/21 1023     03/14/21 1000  abacavir-dolutegravir-lamiVUDine (TRIUMEQ) 600-50-300 MG per tablet 1 tablet        1 tablet Oral Daily 03/13/21 2320     03/14/21 0200  meropenem (MERREM) 1 g in sodium chloride 0.9 % 100 mL IVPB  Status:  Discontinued        1 g 200 mL/hr over 30 Minutes Intravenous Every 8 hours 03/13/21 2337 03/16/21 0719   03/14/21 0100  ceFEPIme (MAXIPIME) 2 g in sodium chloride 0.9 % 100 mL IVPB  Status:  Discontinued       Note to Pharmacy: Cefepime 2 g IV q12h for CrCl < 60 mL/min   2 g 200 mL/hr over 30 Minutes Intravenous Every 12 hours 03/13/21 2326 03/13/21 2331   03/13/21 2000  piperacillin-tazobactam (ZOSYN) IVPB 3.375 g  Status:  Discontinued        3.375 g 12.5 mL/hr over 240 Minutes Intravenous Every 8 hours 03/13/21 1402 03/13/21 2320   03/13/21 1430  clindamycin (CLEOCIN) IVPB 900 mg        900 mg 100 mL/hr over 30 Minutes  Intravenous  Once 03/13/21 1418 03/13/21 1548   03/13/21 1230  vancomycin (VANCOCIN) IVPB 1000 mg/200 mL premix        1,000 mg 200 mL/hr over 60 Minutes Intravenous  Once 03/13/21 1229 03/13/21 1506   03/13/21 1230  piperacillin-tazobactam (ZOSYN) IVPB 3.375 g        3.375 g 100 mL/hr over 30 Minutes Intravenous  Once 03/13/21 1229 03/13/21 1400       Jaymes Hang M.D on 03/28/2021 at 12:53 PM  To page go to [email or web address]   [facility] -  Office  [phone]    See all Orders from today for further details    Objective:   Vitals:   03/26/21 2100 03/27/21 2048 03/28/21 0445 03/28/21 1154  BP: 140/84 128/72 (!) 100/56 113/65  Pulse:  100 73 83  Resp: 20 18 20 19   Temp:  98.2 F (36.8 C) 98.1 F (36.7 C) 98.3 F (36.8 C)  TempSrc: Oral Axillary Axillary Oral  SpO2:  99% 100% 100%  Weight:      Height:        Wt Readings from Last 3 Encounters:  03/25/21 85.2 kg  03/09/21 77.1 kg  01/22/21 72.6 kg     Intake/Output Summary (Last 24 hours) at 03/28/2021 1253 Last data filed at 03/28/2021 1156 Gross per 24 hour  Intake 1096.02 ml  Output 475 ml  Net 621.02 ml   Physical Exam  Awake Alert, Oriented X 3, No new F.N deficits, Normal affect Symmetrical Chest wall movement, Good air movement bilaterally, wound VAC to right chest area. RRR,No Gallops,Rubs or new Murmurs, No Parasternal Heave +ve B.Sounds, Abd Soft, No tenderness, No rebound - guarding or rigidity. No Cyanosis, Clubbing or edema, No new Rash or bruise      Data Review:    CBC Recent Labs  Lab 03/23/21 0203 03/24/21 0542 03/25/21 0032 03/26/21 0329  WBC 5.7 8.0 6.5 5.4  HGB 10.3* 10.2* 10.2* 9.5*  HCT 30.9* 31.2* 31.4* 29.4*  PLT 314 391 364 332  MCV 98.1 100.0 99.4 99.3  MCH 32.7 32.7 32.3 32.1  MCHC 33.3 32.7 32.5 32.3  RDW 13.2 13.3 13.3 13.4  LYMPHSABS 1.5 1.7 1.5 1.4  MONOABS 0.6 0.7 0.7 0.6  EOSABS 0.1 0.3 0.2 0.2  BASOSABS 0.0 0.1 0.0 0.0    Recent Labs  Lab  03/23/21 0203 03/24/21 0542 03/25/21 0032 03/26/21 0329  NA 134* 136 137 137  K 4.5 4.8 4.5 4.7  CL 97* 97* 97* 99  CO2 30 34* 33* 32  GLUCOSE 127* 130* 173* 207*  BUN 23 28* 29* 23  CREATININE 1.20 1.29* 1.36* 1.28*  CALCIUM 8.7* 9.1 8.9 8.7*  AST 15 15 13* 16  ALT 9 10 10 10   ALKPHOS 80 88 82 81  BILITOT 0.6 0.6 0.5 0.6  ALBUMIN 2.3* 2.4* 2.4* 2.3*  MG 2.0 2.0 1.9 1.9  CRP 2.7* 2.6* 2.5* 2.5*  BNP 84.1 37.7 27.3 40.5    ------------------------------------------------------------------------------------------------------------------ No results for input(s): CHOL, HDL, LDLCALC, TRIG, CHOLHDL, LDLDIRECT in the last 72 hours.  Lab Results  Component Value Date   HGBA1C 8.3 (H) 03/14/2021   ------------------------------------------------------------------------------------------------------------------ No results for input(s): TSH, T4TOTAL, T3FREE, THYROIDAB in the last 72 hours.  Invalid input(s): FREET3  Cardiac Enzymes No results for input(s): CKMB, TROPONINI, MYOGLOBIN in the last 168 hours.  Invalid input(s): CK ------------------------------------------------------------------------------------------------------------------    Component Value Date/Time   BNP 40.5 03/26/2021 0329    Micro Results Recent Results (from the past 240 hour(s))  Aerobic/Anaerobic Culture w Gram Stain (surgical/deep wound)     Status: None   Collection Time: 03/18/21  1:38 PM   Specimen: Abscess  Result Value Ref Range Status   Specimen Description ABSCESS  Final   Special Requests DRAIN CHEST WALL  Final   Gram Stain   Final    ABUNDANT WBC PRESENT, PREDOMINANTLY PMN ABUNDANT GRAM POSITIVE COCCI IN CLUSTERS    Culture   Final    ABUNDANT METHICILLIN RESISTANT STAPHYLOCOCCUS AUREUS NO ANAEROBES ISOLATED Performed at [facility], [address], [place], [place] [address]    Report Status 03/24/2021 FINAL  Final   Organism ID, Bacteria METHICILLIN RESISTANT  STAPHYLOCOCCUS AUREUS  Final      Susceptibility   Methicillin resistant staphylococcus aureus - MIC*    CIPROFLOXACIN >=8 RESISTANT Resistant     ERYTHROMYCIN >=8 RESISTANT Resistant     GENTAMICIN <=0.5 SENSITIVE Sensitive     OXACILLIN >=4 RESISTANT Resistant     TETRACYCLINE <=1 SENSITIVE Sensitive     VANCOMYCIN <=0.5 SENSITIVE Sensitive     TRIMETH/SULFA >=320 RESISTANT Resistant     CLINDAMYCIN >=8 RESISTANT Resistant     RIFAMPIN <=0.5 SENSITIVE Sensitive     Inducible Clindamycin NEGATIVE Sensitive     * ABUNDANT METHICILLIN RESISTANT STAPHYLOCOCCUS AUREUS  Fungus Culture With Stain     Status: None (Preliminary result)   Collection Time: 03/19/21  3:07 PM   Specimen: Wound; Abscess  Result Value Ref Range Status   Fungus Stain Final report  Final    Comment: (NOTE) Performed At: [facility] [place], [place] [id number] Rush Farmer MD BZ:[id number]    Fungus (Mycology) Culture PENDING  Incomplete   Fungal Source ABSCESS  Final    Comment: RIGHT CHEST Performed at [facility], [place] [address] [place], [place] [address]   Aerobic/Anaerobic Culture w Gram Stain (surgical/deep wound)     Status: None   Collection Time: 03/19/21  3:07 PM   Specimen: Wound; Abscess  Result Value Ref Range Status   Specimen Description ABSCESS RIGHT CHEST  Final   Special Requests NONE  Final   Gram Stain  Final    FEW WBC PRESENT, PREDOMINANTLY PMN FEW GRAM POSITIVE COCCI IN PAIRS IN CLUSTERS    Culture   Final    FEW METHICILLIN RESISTANT STAPHYLOCOCCUS AUREUS NO ANAEROBES ISOLATED Performed at [facility], [place] [address], [place], [place] [address]    Report Status 03/24/2021 FINAL  Final   Organism ID, Bacteria METHICILLIN RESISTANT STAPHYLOCOCCUS AUREUS  Final      Susceptibility   Methicillin resistant staphylococcus aureus - MIC*    CIPROFLOXACIN >=8 RESISTANT Resistant     ERYTHROMYCIN >=8 RESISTANT Resistant     GENTAMICIN <=0.5  SENSITIVE Sensitive     OXACILLIN >=4 RESISTANT Resistant     TETRACYCLINE <=1 SENSITIVE Sensitive     VANCOMYCIN 1 SENSITIVE Sensitive     TRIMETH/SULFA >=320 RESISTANT Resistant     CLINDAMYCIN >=8 RESISTANT Resistant     RIFAMPIN <=0.5 SENSITIVE Sensitive     Inducible Clindamycin NEGATIVE Sensitive     * FEW METHICILLIN RESISTANT STAPHYLOCOCCUS AUREUS  Fungus Culture Result     Status: None   Collection Time: 03/19/21  3:07 PM  Result Value Ref Range Status   Result 1 Comment  Final    Comment: (NOTE) KOH/Calcofluor preparation:  no fungus observed. Performed At: [facility] [place], [place] [id number] Rush Farmer MD XN:[id number]     Radiology Reports CT CHEST W CONTRAST  Result Date: 03/22/2021 CLINICAL DATA:  70 year old male with chest pain and shortness of breath. EXAM: CT CHEST WITH CONTRAST TECHNIQUE: Multidetector CT imaging of the chest was performed during intravenous contrast administration. CONTRAST:  63mL OMNIPAQUE IOHEXOL 300 MG/ML  SOLN COMPARISON:  Chest CT dated 03/17/2021. FINDINGS: Cardiovascular: There is no cardiomegaly or pericardial effusion. There is 3 vessel coronary vascular calcification. There is mild atherosclerotic calcification of the thoracic aorta. No aneurysmal dilatation or dissection. The origins of the great vessels of the aortic arch appear patent as visualized. No pulmonary artery embolus identified. Mediastinum/Nodes: No hilar or mediastinal adenopathy. The esophagus is grossly unremarkable. No mediastinal fluid collection. Lungs/Pleura: Small bilateral pleural effusions with partial compressive atelectasis of the lower lobes. Pneumonia is not excluded. Clinical correlation is recommended. No lobar consolidation or pneumothorax. The central airways are patent. Upper Abdomen: No acute abnormality. Musculoskeletal: Degenerative changes of the spine. There is an area of inflammatory changes with small pockets of air in the  right anterior chest wall along the anterior right sixth rib. There is fragmentation of the anterior right sixth rib concerning for osteomyelitis. Clinical correlation is recommended. No drainable fluid collection or abscess identified. There is thickening of the adjacent pleura. There is a large skin wound in the lower right anterior chest wall with a wound VAC. IMPRESSION: 1. No CT evidence of pulmonary embolism. 2. Small bilateral pleural effusions with partial compressive atelectasis of the lower lobes. Pneumonia is not excluded. Clinical correlation is recommended. 3. Large skin wound in the lower right anterior chest wall with a wound VAC. 4. Destructive changes of the anterior right sixth rib concerning for osteomyelitis. Clinical correlation is recommended. No drainable fluid collection or abscess. 5. Aortic Atherosclerosis (ICD10-I70.0). Electronically Signed   By: Anner Crete M.D.   On: 03/22/2021 19:48   CT CHEST W CONTRAST  Result Date: 03/17/2021 CLINICAL DATA:  Infected right chest wall hematoma EXAM: CT CHEST WITH CONTRAST TECHNIQUE: Multidetector CT imaging of the chest was performed during intravenous contrast administration. CONTRAST:  75 mL OMNIPAQUE IOHEXOL 350 MG/ML SOLN COMPARISON:  03/13/2021 FINDINGS: Cardiovascular:  Heart size within normal limits. Coronary artery calcifications seen throughout. No significant vascular abnormality identified. Mediastinum/Nodes: No enlarged mediastinal, hilar, or axillary lymph nodes. Lungs/Pleura: Trace bilateral pleural effusions with adjacent atelectasis. Upper Abdomen: No acute abnormality. Musculoskeletal: Previously seen right chest wall collection has decreased in size since prior examination, measuring approximately 7.4 x 7.1 x 3.7 cm compared to 10.1 x 9.1 x 5.7 cm on prior exam from 03/13/2021. The collection is somewhat bilobed. The inferior portion of the right chest wall collection has decreased to a greater degree than the superior  lobulation. Greater amount of air within the collection likely due to presence of drains. Lucency and irregularity of the right sixth and seventh ribs, anterior segment again noted. IMPRESSION: 1. Interval decrease in size of bilobed right anterolateral chest wall fluid collection. The inferior lobulation is nearly completely resolved with some fat stranding and subcutaneous emphysema remaining. The more superior lobulation has decreased in size since prior study from 03/13/2021. The overall size of the collections now measures 7.4 x 7.1 x 3.7 cm compared to 10.1 x 9.1 x 5.7 cm on 03/13/2021. 2. Lucency in the anterior segments of the right sixth and seventh ribs again seen. Findings are suspicious for osteomyelitis, however underlying pathologic fracture is also possible. Electronically Signed   By: Miachel Roux M.D.   On: 03/17/2021 14:05   CT Chest W Contrast  Result Date: 03/13/2021 CLINICAL DATA:  Follow-up right chest wall hematoma and fractures of the right sixth through eighth ribs. Diabetes. HIV. EXAM: CT CHEST WITH CONTRAST TECHNIQUE: Multidetector CT imaging of the chest was performed during intravenous contrast administration. CONTRAST:  147mL OMNIPAQUE IOHEXOL 300 MG/ML  SOLN COMPARISON:  03/09/2021 FINDINGS: Cardiovascular: Heart size remains normal. No pericardial fluid. Coronary artery calcification as seen previously. Aortic atherosclerotic calcification as seen previously. Mediastinum/Nodes: Normal Lungs/Pleura: Scarring/atelectasis at the lung bases left more than right unchanged since the previous study. Small areas of patchy density in the superior segment of the right lower lobe unchanged from the study of 4 days ago. No new or progressive pulmonary finding. No pneumothorax. Upper Abdomen: No upper abdominal injury or acute finding. Musculoskeletal: There is increasing size of the right chest wall collections, primarily centered around the right sixth and eighth anterolateral ribs. Those  ribs show a pattern of irregular destruction that look more like pathologic fractures due to infection rather than traumatic fractures. Air bubbles are now present within the collections. There is mild extrapleural involvement in side of the ribcage, without frank breakthrough into the pleural space. Most of the collections manifest external to the ribcage. No new areas of involvement are seen. IMPRESSION: Increasing size of the right chest wall collections, primarily centered around the right sixth and eighth anterolateral ribs, with development of air bubbles. Those ribs show a pattern of irregular destruction that have progressed and look more like pathologic fractures due to infection rather than traumatic fractures. Air bubbles are now present within the collections and they probably represent abscesses. There is mild extrapleural involvement on the inner side of the ribcage, without frank breakthrough into the pleural space. The majority of the collections are on the outer side of the ribcage. No new areas of involvement are seen. Case discussed with Dr. Regenia Skeeter at 1419 hours. Aortic Atherosclerosis (ICD10-I70.0). Electronically Signed   By: Nelson Chimes M.D.   On: 03/13/2021 14:19   CT Chest W Contrast  Result Date: 03/09/2021 CLINICAL DATA:  Fall 3 weeks prior with right rib fractures and persistent chest  pain. EXAM: CT CHEST WITH CONTRAST TECHNIQUE: Multidetector CT imaging of the chest was performed during intravenous contrast administration. CONTRAST:  1mL OMNIPAQUE IOHEXOL 300 MG/ML  SOLN COMPARISON:  Chest radiograph from earlier today. 11/22/2019 chest CT angiogram. FINDINGS: Cardiovascular: Normal heart size. No significant pericardial effusion/thickening. Three-vessel coronary atherosclerosis. Atherosclerotic nonaneurysmal thoracic aorta. Top-normal caliber main pulmonary artery (3.0 cm diameter). No central pulmonary emboli. Mediastinum/Nodes: No discrete thyroid nodules. Unremarkable  esophagus. No pathologically enlarged axillary, mediastinal or hilar lymph nodes. Lungs/Pleura: No pneumothorax. No pleural effusion. No acute consolidative airspace disease, lung masses or significant pulmonary nodules. Mild platelike atelectasis in the anterior basilar left lower lobe. Mild patchy ground-glass opacity in peripheral posterior right mid lung. Upper abdomen: No acute abnormality. Musculoskeletal: No aggressive appearing focal osseous lesions. Nondisplaced acute anterior right sixth, seventh and eighth rib fractures with surrounding chest wall hematoma measuring up to 8.1 x 4.9 cm in maximum axial dimensions at the level of the anterior right sixth rib fracture (series 2/image 114). Moderate thoracic spondylosis. IMPRESSION: 1. Nondisplaced acute anterior right sixth, seventh and eighth rib fractures with surrounding chest wall hematoma. No pneumothorax or hemothorax. No discrete bone lesions. Clinical follow-up advised to ensure resolution of the right chest wall hematoma. Any need for follow-up imaging should be based on clinical assessment. 2. Mild patchy ground-glass opacity in the peripheral posterior right mid lung, nonspecific, favor mild pulmonary contusion. 3. Mild platelike atelectasis at the left lung base. 4. Three-vessel coronary atherosclerosis. 5. Aortic Atherosclerosis (ICD10-I70.0). Electronically Signed   By: Ilona Sorrel M.D.   On: 03/09/2021 13:03   IR US Guide Bx Asp/Drain  Result Date: 03/14/2021 INDICATION: 70 year old with history of fall and right rib fractures. Patient developed right chest hematomas. Patient is complaining of pain and recent CT demonstrates gas within the hematomas. Findings are concerning for infected hematomas. EXAM: PLACEMENT OF SUPERFICIAL RIGHT ANTERIOR CHEST WALL DRAIN USING ULTRASOUND GUIDANCE x 2 MEDICATIONS: Moderate sedation ANESTHESIA/SEDATION: Fentanyl 100 mcg IV; Versed 2.0 mg IV Moderate Sedation Time:  29 minutes The patient was  continuously monitored during the procedure by the interventional radiology nurse under my direct supervision. COMPLICATIONS: None immediate. PROCEDURE: Informed written consent was obtained from the patient after a thorough discussion of the procedural risks, benefits and alternatives. All questions were addressed. Maximal Sterile Barrier Technique was utilized including caps, mask, sterile gowns, sterile gloves, sterile drape, hand hygiene and skin antiseptic. A timeout was performed prior to the initiation of the procedure. The right anterior chest wall hematomas were evaluated with ultrasound. Complex collections containing a small amount of fluid were obtained. The right anterior chest was prepped with chlorhexidine and sterile field was created. Attention was initially directed to the more inferior or caudal anterior chest wall collection. Skin was anesthetized with 1% lidocaine. Using ultrasound guidance, an 18 gauge trocar needle was directed into the collection and pink purulent fluid was aspirated. Superstiff Amplatz wire was advanced into the complex collection and the tract was dilated to accommodate a 10 [place] drain. Catheter was sutured to the skin and attached to a suction bulb. A sample of fluid was sent for culture. A second area more cephalad was targeted with ultrasound guidance. Skin was anesthetized with 1% lidocaine. Using ultrasound guidance, an 18 gauge trocar needle was directed into the complex collection. Again, purulent fluid was aspirated. Superstiff Amplatz wire was advanced into this collection and a 10 French drain was placed. Additional purulent fluid was aspirated and the catheter was sutured to skin and attached  to a suction bulb. Dressings were placed over both drains. FINDINGS: Patient has palpable hematomas in the right anterior chest and upper abdomen region. Ultrasound demonstrates heterogeneous collections in both areas containing a small amount of compressible fluid. There  are echogenic areas within the collections compatible with known gas. 10 French drains were placed in both areas and purulent fluid was draining from both collections. Both collections are very complex and compatible with infected hematomas. IMPRESSION: Ultrasound-guided placement of 2 percutaneous drains within the superficial right anterior chest wall collections. Findings are compatible with infected hematomas. Fluid sample was sent for culture. Electronically Signed   By: Markus Daft M.D.   On: 03/14/2021 18:37   IR US Guide Bx Asp/Drain  Result Date: 03/14/2021 INDICATION: 70 year old with history of fall and right rib fractures. Patient developed right chest hematomas. Patient is complaining of pain and recent CT demonstrates gas within the hematomas. Findings are concerning for infected hematomas. EXAM: PLACEMENT OF SUPERFICIAL RIGHT ANTERIOR CHEST WALL DRAIN USING ULTRASOUND GUIDANCE x 2 MEDICATIONS: Moderate sedation ANESTHESIA/SEDATION: Fentanyl 100 mcg IV; Versed 2.0 mg IV Moderate Sedation Time:  29 minutes The patient was continuously monitored during the procedure by the interventional radiology nurse under my direct supervision. COMPLICATIONS: None immediate. PROCEDURE: Informed written consent was obtained from the patient after a thorough discussion of the procedural risks, benefits and alternatives. All questions were addressed. Maximal Sterile Barrier Technique was utilized including caps, mask, sterile gowns, sterile gloves, sterile drape, hand hygiene and skin antiseptic. A timeout was performed prior to the initiation of the procedure. The right anterior chest wall hematomas were evaluated with ultrasound. Complex collections containing a small amount of fluid were obtained. The right anterior chest was prepped with chlorhexidine and sterile field was created. Attention was initially directed to the more inferior or caudal anterior chest wall collection. Skin was anesthetized with 1%  lidocaine. Using ultrasound guidance, an 18 gauge trocar needle was directed into the collection and pink purulent fluid was aspirated. Superstiff Amplatz wire was advanced into the complex collection and the tract was dilated to accommodate a 10 [place] drain. Catheter was sutured to the skin and attached to a suction bulb. A sample of fluid was sent for culture. A second area more cephalad was targeted with ultrasound guidance. Skin was anesthetized with 1% lidocaine. Using ultrasound guidance, an 18 gauge trocar needle was directed into the complex collection. Again, purulent fluid was aspirated. Superstiff Amplatz wire was advanced into this collection and a 10 French drain was placed. Additional purulent fluid was aspirated and the catheter was sutured to skin and attached to a suction bulb. Dressings were placed over both drains. FINDINGS: Patient has palpable hematomas in the right anterior chest and upper abdomen region. Ultrasound demonstrates heterogeneous collections in both areas containing a small amount of compressible fluid. There are echogenic areas within the collections compatible with known gas. 10 French drains were placed in both areas and purulent fluid was draining from both collections. Both collections are very complex and compatible with infected hematomas. IMPRESSION: Ultrasound-guided placement of 2 percutaneous drains within the superficial right anterior chest wall collections. Findings are compatible with infected hematomas. Fluid sample was sent for culture. Electronically Signed   By: Markus Daft M.D.   On: 03/14/2021 18:37   DG Chest Port 1 View  Result Date: 03/13/2021 CLINICAL DATA:  Sepsis, right rib pain. EXAM: PORTABLE CHEST 1 VIEW COMPARISON:  March 09, 2021. FINDINGS: The heart size and mediastinal contours are within normal  limits. Both lungs are clear. No pneumothorax or pleural effusion is noted. The visualized skeletal structures are unremarkable. IMPRESSION: No active  disease. Electronically Signed   By: Marijo Conception M.D.   On: 03/13/2021 14:11   DG Chest Portable 1 View  Result Date: 03/09/2021 CLINICAL DATA:  Chest pain. Chest [place] hurting late yesterday. Complains of right-sided sternal pain and chest pain. EXAM: PORTABLE CHEST 1 VIEW COMPARISON:  None. FINDINGS: The heart size and mediastinal contours are within normal limits. Both lungs are clear. Blunting of the left costophrenic angle is noted, new from prior studies. The visualized skeletal structures are unremarkable. IMPRESSION: New blunting of left costophrenic angle may reflect small effusion. The lungs are otherwise clear. No signs of interstitial edema. Electronically Signed   By: Kerby Moors M.D.   On: 03/09/2021 11:26   [place] IMAGE GUIDED DRAINAGE BY PERCUTANEOUS CATHETER  Result Date: 03/18/2021 INDICATION: History of right-sided rib fractures complicated by development of infected adjacent hematomas, post ultrasound-guided placement of two percutaneous drainage catheters on 03/14/2021. Unfortunately, one of the percutaneous drainage catheters was inadvertently removed with subsequent chest CT performed 03/17/2021 demonstrating a persistent undrained complex fluid collection involving the anterior aspect of the right chest wall. As such, request made for ultrasound-guided aspiration and/or drainage catheter placement for infection source control purposes. EXAM: 1. ULTRASOUND-GUIDED RIGHT CHEST WALL PERCUTANEOUS DRAINAGE CATHETER PLACEMENT X2 2. ULTRASOUND-GUIDED RIGHT CHEST WALL ABSCESS ASPIRATION COMPARISON:  Chest CT-03/17/2021; 03/13/2021; ultrasound-guided right chest wall percutaneous drainage catheter placement x2-03/14/2021 MEDICATIONS: The patient is currently admitted to the hospital and receiving intravenous antibiotics. The antibiotics were administered within an appropriate time frame prior to the initiation of the procedure. ANESTHESIA/SEDATION: Moderate (conscious) sedation was employed  during this procedure. A total of Versed 4 mg and Fentanyl 200 mcg was administered intravenously. Moderate Sedation Time: 41 minutes. The patient's level of consciousness and vital signs were monitored continuously by radiology nursing throughout the procedure under my direct supervision. CONTRAST:  None COMPLICATIONS: None immediate. PROCEDURE: Informed written consent was obtained from the patient after a discussion of the risks, benefits and alternatives to treatment. Preprocedural ultrasound scanning demonstrated complex fluid involving the anterior aspect of the right chest wall compatible with the findings seen on preceding chest CT. A timeout was performed prior to the initiation of the procedure. The skin overlying the right anterior chest was prepped and draped in the usual sterile fashion. The overlying soft tissues were anesthetized with 1% lidocaine with epinephrine. Under direct ultrasound guidance, a 18 gauge trocar needle was advanced into the complex abscess within the right chest wall, inferior to the nipple. A short Amplatz wire was coiled within the collection. The track was dilated ultimately allowing placement of a 10 French percutaneous catheter. Multiple ultrasound images were saved procedural documentation purposes. Next, approximately 10 cc of purulent fluid was aspirated from the drainage catheter Sonographic evaluation demonstrates a residual complex fluid collection and as such the more medial component of the collection was accessed with an 18 gauge trocar needle. A short Amplatz wire was coiled within the collection and an additional 10 French percutaneous drainage catheter was placed under ultrasound guidance. Multiple ultrasound images were saved procedural documentation purposes. Next, approximately 30 cc of purulent fluid was aspirated from this more medially positioned chest wall drain. A representative sample was capped and sent to the laboratory for analysis. Next, the lateral  component of the right chest wall collection was accessed with an 18 gauge trocar needle however only a small  amount of bloody fluid was able to be aspirated. As such, a short Amplatz wire was coiled within the collection and the trocar needle was exchanged for a Yueh sheath catheter which was utilized to aspirate approximately 3 cc of thick bloody fluid. At this time, approximately 70 cc of additional blood-tinged purulent fluid was able to be expressed from the site of the previously removed percutaneous drainage catheter. Drainage catheters were secured at the entrance site within interrupted sutures and drainage catheters were connected to JP bulbs. Dressings were applied. The patient tolerated the procedure well without immediate postprocedural complication. IMPRESSION: 1. Successful ultrasound-guided placement of 2 two additional right anterior chest wall percutaneous drainage catheters yielding a total of 40 cc of purulent fluid. A representative aspirated sample was sent to the laboratory for analysis. 2. Successful ultrasound-guided aspiration of approximately 3 cc of thick bloody fluid from the more lateral component of the right anterior chest wall complex hematoma. 3. Successful bedside expression of approximately 70 cc of purulent fluid from the entrance site of the recently removed percutaneous drainage catheter. Electronically Signed   By: Sandi Mariscal M.D.   On: 03/18/2021 15:29   [place] IMAGE GUIDED DRAINAGE BY PERCUTANEOUS CATHETER  Result Date: 03/18/2021 INDICATION: History of right-sided rib fractures complicated by development of infected adjacent hematomas, post ultrasound-guided placement of two percutaneous drainage catheters on 03/14/2021. Unfortunately, one of the percutaneous drainage catheters was inadvertently removed with subsequent chest CT performed 03/17/2021 demonstrating a persistent undrained complex fluid collection involving the anterior aspect of the right chest wall. As such,  request made for ultrasound-guided aspiration and/or drainage catheter placement for infection source control purposes. EXAM: 1. ULTRASOUND-GUIDED RIGHT CHEST WALL PERCUTANEOUS DRAINAGE CATHETER PLACEMENT X2 2. ULTRASOUND-GUIDED RIGHT CHEST WALL ABSCESS ASPIRATION COMPARISON:  Chest CT-03/17/2021; 03/13/2021; ultrasound-guided right chest wall percutaneous drainage catheter placement x2-03/14/2021 MEDICATIONS: The patient is currently admitted to the hospital and receiving intravenous antibiotics. The antibiotics were administered within an appropriate time frame prior to the initiation of the procedure. ANESTHESIA/SEDATION: Moderate (conscious) sedation was employed during this procedure. A total of Versed 4 mg and Fentanyl 200 mcg was administered intravenously. Moderate Sedation Time: 41 minutes. The patient's level of consciousness and vital signs were monitored continuously by radiology nursing throughout the procedure under my direct supervision. CONTRAST:  None COMPLICATIONS: None immediate. PROCEDURE: Informed written consent was obtained from the patient after a discussion of the risks, benefits and alternatives to treatment. Preprocedural ultrasound scanning demonstrated complex fluid involving the anterior aspect of the right chest wall compatible with the findings seen on preceding chest CT. A timeout was performed prior to the initiation of the procedure. The skin overlying the right anterior chest was prepped and draped in the usual sterile fashion. The overlying soft tissues were anesthetized with 1% lidocaine with epinephrine. Under direct ultrasound guidance, a 18 gauge trocar needle was advanced into the complex abscess within the right chest wall, inferior to the nipple. A short Amplatz wire was coiled within the collection. The track was dilated ultimately allowing placement of a 10 French percutaneous catheter. Multiple ultrasound images were saved procedural documentation purposes. Next,  approximately 10 cc of purulent fluid was aspirated from the drainage catheter Sonographic evaluation demonstrates a residual complex fluid collection and as such the more medial component of the collection was accessed with an 18 gauge trocar needle. A short Amplatz wire was coiled within the collection and an additional 10 French percutaneous drainage catheter was placed under ultrasound guidance. Multiple ultrasound images were  saved procedural documentation purposes. Next, approximately 30 cc of purulent fluid was aspirated from this more medially positioned chest wall drain. A representative sample was capped and sent to the laboratory for analysis. Next, the lateral component of the right chest wall collection was accessed with an 18 gauge trocar needle however only a small amount of bloody fluid was able to be aspirated. As such, a short Amplatz wire was coiled within the collection and the trocar needle was exchanged for a Yueh sheath catheter which was utilized to aspirate approximately 3 cc of thick bloody fluid. At this time, approximately 70 cc of additional blood-tinged purulent fluid was able to be expressed from the site of the previously removed percutaneous drainage catheter. Drainage catheters were secured at the entrance site within interrupted sutures and drainage catheters were connected to JP bulbs. Dressings were applied. The patient tolerated the procedure well without immediate postprocedural complication. IMPRESSION: 1. Successful ultrasound-guided placement of 2 two additional right anterior chest wall percutaneous drainage catheters yielding a total of 40 cc of purulent fluid. A representative aspirated sample was sent to the laboratory for analysis. 2. Successful ultrasound-guided aspiration of approximately 3 cc of thick bloody fluid from the more lateral component of the right anterior chest wall complex hematoma. 3. Successful bedside expression of approximately 70 cc of purulent  fluid from the entrance site of the recently removed percutaneous drainage catheter. Electronically Signed   By: Sandi Mariscal M.D.   On: 03/18/2021 15:29   [place] FINE NEEDLE ASP 1ST LESION  Result Date: 03/18/2021 INDICATION: History of right-sided rib fractures complicated by development of infected adjacent hematomas, post ultrasound-guided placement of two percutaneous drainage catheters on 03/14/2021. Unfortunately, one of the percutaneous drainage catheters was inadvertently removed with subsequent chest CT performed 03/17/2021 demonstrating a persistent undrained complex fluid collection involving the anterior aspect of the right chest wall. As such, request made for ultrasound-guided aspiration and/or drainage catheter placement for infection source control purposes. EXAM: 1. ULTRASOUND-GUIDED RIGHT CHEST WALL PERCUTANEOUS DRAINAGE CATHETER PLACEMENT X2 2. ULTRASOUND-GUIDED RIGHT CHEST WALL ABSCESS ASPIRATION COMPARISON:  Chest CT-03/17/2021; 03/13/2021; ultrasound-guided right chest wall percutaneous drainage catheter placement x2-03/14/2021 MEDICATIONS: The patient is currently admitted to the hospital and receiving intravenous antibiotics. The antibiotics were administered within an appropriate time frame prior to the initiation of the procedure. ANESTHESIA/SEDATION: Moderate (conscious) sedation was employed during this procedure. A total of Versed 4 mg and Fentanyl 200 mcg was administered intravenously. Moderate Sedation Time: 41 minutes. The patient's level of consciousness and vital signs were monitored continuously by radiology nursing throughout the procedure under my direct supervision. CONTRAST:  None COMPLICATIONS: None immediate. PROCEDURE: Informed written consent was obtained from the patient after a discussion of the risks, benefits and alternatives to treatment. Preprocedural ultrasound scanning demonstrated complex fluid involving the anterior aspect of the right chest wall compatible with  the findings seen on preceding chest CT. A timeout was performed prior to the initiation of the procedure. The skin overlying the right anterior chest was prepped and draped in the usual sterile fashion. The overlying soft tissues were anesthetized with 1% lidocaine with epinephrine. Under direct ultrasound guidance, a 18 gauge trocar needle was advanced into the complex abscess within the right chest wall, inferior to the nipple. A short Amplatz wire was coiled within the collection. The track was dilated ultimately allowing placement of a 10 French percutaneous catheter. Multiple ultrasound images were saved procedural documentation purposes. Next, approximately 10 cc of purulent fluid was aspirated from the  drainage catheter Sonographic evaluation demonstrates a residual complex fluid collection and as such the more medial component of the collection was accessed with an 18 gauge trocar needle. A short Amplatz wire was coiled within the collection and an additional 10 French percutaneous drainage catheter was placed under ultrasound guidance. Multiple ultrasound images were saved procedural documentation purposes. Next, approximately 30 cc of purulent fluid was aspirated from this more medially positioned chest wall drain. A representative sample was capped and sent to the laboratory for analysis. Next, the lateral component of the right chest wall collection was accessed with an 18 gauge trocar needle however only a small amount of bloody fluid was able to be aspirated. As such, a short Amplatz wire was coiled within the collection and the trocar needle was exchanged for a Yueh sheath catheter which was utilized to aspirate approximately 3 cc of thick bloody fluid. At this time, approximately 70 cc of additional blood-tinged purulent fluid was able to be expressed from the site of the previously removed percutaneous drainage catheter. Drainage catheters were secured at the entrance site within interrupted  sutures and drainage catheters were connected to JP bulbs. Dressings were applied. The patient tolerated the procedure well without immediate postprocedural complication. IMPRESSION: 1. Successful ultrasound-guided placement of 2 two additional right anterior chest wall percutaneous drainage catheters yielding a total of 40 cc of purulent fluid. A representative aspirated sample was sent to the laboratory for analysis. 2. Successful ultrasound-guided aspiration of approximately 3 cc of thick bloody fluid from the more lateral component of the right anterior chest wall complex hematoma. 3. Successful bedside expression of approximately 70 cc of purulent fluid from the entrance site of the recently removed percutaneous drainage catheter. Electronically Signed   By: Sandi Mariscal M.D.   On: 03/18/2021 15:29

## 2021-03-29 DIAGNOSIS — N179 Acute kidney failure, unspecified: Secondary | ICD-10-CM | POA: Diagnosis not present

## 2021-03-29 DIAGNOSIS — A419 Sepsis, unspecified organism: Secondary | ICD-10-CM | POA: Diagnosis not present

## 2021-03-29 DIAGNOSIS — L02213 Cutaneous abscess of chest wall: Secondary | ICD-10-CM | POA: Diagnosis not present

## 2021-03-29 DIAGNOSIS — R6520 Severe sepsis without septic shock: Secondary | ICD-10-CM | POA: Diagnosis not present

## 2021-03-29 LAB — GLUCOSE, CAPILLARY
Glucose-Capillary: 140 mg/dL — ABNORMAL HIGH (ref 70–99)
Glucose-Capillary: 103 mg/dL — ABNORMAL HIGH (ref 70–99)
Glucose-Capillary: 174 mg/dL — ABNORMAL HIGH (ref 70–99)
Glucose-Capillary: 193 mg/dL — ABNORMAL HIGH (ref 70–99)
Glucose-Capillary: 179 mg/dL — ABNORMAL HIGH (ref 70–99)

## 2021-03-29 MED ORDER — OXYCODONE-ACETAMINOPHEN 5-325 MG PO TABS
2.0000 | ORAL_TABLET | Freq: Four times a day (QID) | ORAL | Status: DC | PRN
  Administered 2021-03-29 – 2021-03-30 (×4): 2 via ORAL
  Filled 2021-03-29 (×4): qty 2

## 2021-03-29 NOTE — Progress Notes (Signed)
PROGRESS NOTE                                                                                                                                                                                                             Patient Demographics:    Brian Malone, is a 70 y.o. male, DOB - 1951-10-30, LHT:[id number]  Outpatient Primary MD for the patient is [name], [name], MD    LOS - 16  Admit date - 03/13/2021    Chief Complaint  Patient presents with  . Rib Injury       Brief Narrative (HPI from H&P)    70 year old male with past medical history of hepatitis C, hypertension, hyperlipidemia, diabetes mellitus type 2, chronic pain syndrome, intravenous drug use (abstinent since 2019), major depressive disorder and HIV who had a trip and fall in his yard on 02/22/2021 after which he hurt his right-sided chest wall, at that time he was diagnosed with right-sided rib cage fracture and injury, he was seen in the ER a few times since then and was treated conservatively, his pain and discomfort continued to get worse and he presented again to [facility] on  03/13/2021 CT scan suggested that he had right chest wall abscess formation and cellulitis with concerns for sepsis.,  Patient went for drain placement by IR with minimal improvement, seen by CT surgery, went to the OR 03/19/2021 with open incision and drainage and wound VAC, back to the OR 4/6 for wound VAC exchange and some debridement, remains on IV daptomycin.   Subjective:   Patient reports pain at the wound VAC site, fever, no chills, no dyspnea.   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  - ID consulted, will need IV daptomycin total time 3 weeks from 03/19/2021, then to continue with p.o.  Zyvox as an outpatient till seen by ID clinic. - Encouraged the patient to sit up in chair in the daytime use I-S and flutter valve for pulmonary toiletry.  Will advance activity and titrate down oxygen as possible. -Discussed pain regimen with the patient, we will try to minimize IV Dilaudid, so we will start on fentanyl patch 25 mcg, will decrease Dilaudid where he can be weaned off it over the next 24 to 48 hours, I have increased his p.o. oxycodone to 10 mg every 6 hours as needed so can minimize his IV Dilaudid use . - he reports significant pain with dressing changes, so he received IV Dilaudid prior to that.   Chronic pain and narcotic use.   Supportive care as needed for acute discomfort, avoid overuse, as needed Narcan added.  Patient counseled , please see above discussion  HIV - Continue home regimen follows with [facility] ID department.  Anxiety and depression.  Home medications continued.  Currently not homicidal or suicidal.  GERD.  On PPI.  BPH.  On Flomax.  AKI.  Due to ATN from sepsis, resolved after hydration, gentle hydration again on 03/24/2021.  Hypertension.  Blood pressure soft, blood pressure medicine has been discontinued.  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  On sliding scale monitor and adjust  Lab Results  Component Value Date   HGBA1C 8.3 (H) 03/14/2021   CBG (last 3)  Recent Labs    03/28/21 2041 03/29/21 0452 03/29/21 0755  GLUCAP 173* 140* 103*          Condition - Extremely Guarded  Family Communication  : None present bedside  Code Status :   Full  Consults  :  IR, cardiothoracic surgery, ID  PUD Prophylaxis : PPI   Procedures  :     Right chest wall I&D by Dr. Kipp Brood 3/52/48 -Repeat application of wound VAC and incision and drainage on 4/6  US guided chest wall abscess drined by IR 03/14/21   CT -  Increasing size of the right chest wall collections, primarily centered around the right sixth and eighth anterolateral ribs, with development of air bubbles. Those ribs show a pattern of irregular destruction that have progressed and look more like pathologic fractures due to infection rather than traumatic fractures. Air bubbles are now present within the collections and they probably represent abscesses. There is mild extrapleural involvement on the inner side of the ribcage, without frank breakthrough into the pleural space. The majority of the collections are on the outer side of the ribcage. No new areas of involvement are seen.  CT repeat 03/17/21 - 1. Interval decrease in size of bilobed right anterolateral chest wall fluid collection. The inferior lobulation is nearly completely resolved with some fat stranding and subcutaneous emphysema remaining. The more superior lobulation has decreased in size since prior study from 03/13/2021. The overall size of the collections now measures 7.4 x 7.1 x 3.7 cm compared to 10.1 x 9.1 x 5.7 cm on 03/13/2021. 2. Lucency in the anterior segments of the right sixth and seventh ribs again seen. Findings are suspicious for osteomyelitis, however underlying pathologic fracture is also possible.      Disposition Plan  :    Status is: Inpatient  Remains inpatient appropriate because:IV treatments appropriate due to intensity of illness or inability to take PO   Dispo: The patient is from: Home              Anticipated d/c is to:  Home              Patient currently is not medically stable to d/c.   Difficult to place patient No  DVT Prophylaxis  :   Heparin   Lab Results  Component Value  Date   PLT 332 03/26/2021    Diet :  Diet Order            Diet Carb Modified Fluid consistency: Thin; Room service appropriate? Yes  Diet effective now                  Inpatient Medications  Scheduled Meds: . (feeding supplement) PROSource Plus  30 mL Oral TID WC  . abacavir-dolutegravir-lamiVUDine  1 tablet Oral Daily  . busPIRone  30 mg Oral BID  . clonazepam  0.5 mg Oral TID  . ezetimibe  10 mg Oral Daily  . feeding supplement  237 mL Oral BID BM  . feeding supplement (GLUCERNA SHAKE)  237 mL Oral Q24H  . fentaNYL  1 patch Transdermal Q72H  . Glecaprevir-Pibrentasvir  3 tablet Oral Daily  . heparin injection (subcutaneous)  5,000 Units Subcutaneous Q8H  . insulin aspart  0-15 Units Subcutaneous TID AC & HS  . [START ON 04/10/2021] linezolid  600 mg Oral Q12H  . metoprolol tartrate  25 mg Oral BID  . multivitamin with minerals  1 tablet Oral Daily  . pantoprazole  40 mg Oral QAC breakfast  . tamsulosin  0.4 mg Oral QHS  . tiZANidine  2 mg Oral QHS   Continuous Infusions: . sodium chloride 50 mL/hr at 03/24/21 2342  . DAPTOmycin (CUBICIN)  IV 700 mg (03/28/21 2019)  . lactated ringers 10 mL/hr at 03/25/21 1007   PRN Meds:.sodium chloride, acetaminophen **OR** [DISCONTINUED] acetaminophen, antiseptic oral rinse, docusate sodium, HYDROmorphone (DILAUDID) injection **OR** [DISCONTINUED]  HYDROmorphone (DILAUDID) injection, HYDROmorphone (DILAUDID) injection, naLOXone (NARCAN)  injection, [DISCONTINUED] ondansetron **OR** ondansetron (ZOFRAN) IV, oxyCODONE-acetaminophen, polyethylene glycol, valACYclovir  Antibiotics  :    Anti-infectives (From admission, onward)   Start     Dose/Rate Route Frequency Ordered Stop   04/10/21 1000  linezolid (ZYVOX) tablet 600 mg        600 mg Oral Every 12 hours 03/27/21 1410 05/01/21 0959   03/27/21 0000  linezolid (ZYVOX) 600 MG tablet       Note to Pharmacy: Patient staying through next week for IV abx - Just working ahead.   600 mg  Oral 2 times daily 03/27/21 0933 04/17/21 2359   03/23/21 2000  DAPTOmycin (CUBICIN) 700 mg in sodium chloride 0.9 % IVPB        8 mg/kg  85.2 kg 128 mL/hr over 30 Minutes Intravenous Every 24 hours 03/23/21 1133 04/09/21 2359   03/22/21 1200  vancomycin (VANCOREADY) IVPB 1250 mg/250 mL  Status:  Discontinued        1,250 mg 166.7 mL/hr over 90 Minutes Intravenous Every 24 hours 03/21/21 1319 03/23/21 1133   03/20/21 1000  Glecaprevir-Pibrentasivir 100-40 mg (Mavyret) tabs 3 tablets - patient's own supply        3 tablet Oral Daily 03/20/21 0733     03/15/21 1200  vancomycin (VANCOREADY) IVPB 1500 mg/300 mL  Status:  Discontinued        1,500 mg 150 mL/hr over 120 Minutes Intravenous Every 24 hours 03/15/21 0732 03/21/21 1319   03/14/21 1200  vancomycin (VANCOREADY) IVPB 1250 mg/250 mL  Status:  Discontinued        1,250 mg 166.7 mL/hr over  90 Minutes Intravenous Every 24 hours 03/13/21 1359 03/15/21 0732   03/14/21 1023  valACYclovir (VALTREX) tablet 1,000 mg       Note to Pharmacy: Trig neuralgia flare     1,000 mg Oral 3 times daily PRN 03/14/21 1023     03/14/21 1000  abacavir-dolutegravir-lamiVUDine (TRIUMEQ) 600-50-300 MG per tablet 1 tablet        1 tablet Oral Daily 03/13/21 2320     03/14/21 0200  meropenem (MERREM) 1 g in sodium chloride 0.9 % 100 mL IVPB  Status:  Discontinued        1 g 200 mL/hr over 30 Minutes Intravenous Every 8 hours 03/13/21 2337 03/16/21 0719   03/14/21 0100  ceFEPIme (MAXIPIME) 2 g in sodium chloride 0.9 % 100 mL IVPB  Status:  Discontinued       Note to Pharmacy: Cefepime 2 g IV q12h for CrCl < 60 mL/min   2 g 200 mL/hr over 30 Minutes Intravenous Every 12 hours 03/13/21 2326 03/13/21 2331   03/13/21 2000  piperacillin-tazobactam (ZOSYN) IVPB 3.375 g  Status:  Discontinued        3.375 g 12.5 mL/hr over 240 Minutes Intravenous Every 8 hours 03/13/21 1402 03/13/21 2320   03/13/21 1430  clindamycin (CLEOCIN) IVPB 900 mg        900 mg 100 mL/hr over  30 Minutes Intravenous  Once 03/13/21 1418 03/13/21 1548   03/13/21 1230  vancomycin (VANCOCIN) IVPB 1000 mg/200 mL premix        1,000 mg 200 mL/hr over 60 Minutes Intravenous  Once 03/13/21 1229 03/13/21 1506   03/13/21 1230  piperacillin-tazobactam (ZOSYN) IVPB 3.375 g        3.375 g 100 mL/hr over 30 Minutes Intravenous  Once 03/13/21 1229 03/13/21 1400       Jaci Desanto M.D on 03/29/2021 at 11:38 AM  To page go to [email or web address]   [facility] -  Office  [phone]    See all Orders from today for further details    Objective:   Vitals:   03/28/21 0445 03/28/21 1154 03/28/21 2036 03/29/21 0450  BP: (!) 100/56 113/65 131/77 106/67  Pulse: 73 83 79 63  Resp: 20 19 17 18   Temp: 98.1 F (36.7 C) 98.3 F (36.8 C) 97.6 F (36.4 C) 97.6 F (36.4 C)  TempSrc: Axillary Oral Oral Oral  SpO2: 100% 100% 100% 99%  Weight:      Height:        Wt Readings from Last 3 Encounters:  03/25/21 85.2 kg  03/09/21 77.1 kg  01/22/21 72.6 kg     Intake/Output Summary (Last 24 hours) at 03/29/2021 1138 Last data filed at 03/29/2021 0510 Gross per 24 hour  Intake 544 ml  Output 1000 ml  Net -456 ml   Physical Exam  Awake Alert, Oriented X 3, No new F.N deficits, Normal affect Symmetrical Chest wall movement, Good air movement bilaterally, wound VAC to right lower chest RRR,No Gallops,Rubs or new Murmurs, No Parasternal Heave +ve B.Sounds, Abd Soft, No tenderness, No rebound - guarding or rigidity. No Cyanosis, Clubbing or edema, No new Rash or bruise       Data Review:    CBC Recent Labs  Lab 03/23/21 0203 03/24/21 0542 03/25/21 0032 03/26/21 0329  WBC 5.7 8.0 6.5 5.4  HGB 10.3* 10.2* 10.2* 9.5*  HCT 30.9* 31.2* 31.4* 29.4*  PLT 314 391 364 332  MCV 98.1 100.0 99.4 99.3  MCH 32.7 32.7 32.3 32.1  MCHC 33.3 32.7 32.5 32.3  RDW 13.2 13.3 13.3 13.4  LYMPHSABS 1.5 1.7 1.5 1.4  MONOABS 0.6 0.7 0.7 0.6  EOSABS 0.1 0.3 0.2 0.2  BASOSABS 0.0 0.1 0.0 0.0     Recent Labs  Lab 03/23/21 0203 03/24/21 0542 03/25/21 0032 03/26/21 0329  NA 134* 136 137 137  K 4.5 4.8 4.5 4.7  CL 97* 97* 97* 99  CO2 30 34* 33* 32  GLUCOSE 127* 130* 173* 207*  BUN 23 28* 29* 23  CREATININE 1.20 1.29* 1.36* 1.28*  CALCIUM 8.7* 9.1 8.9 8.7*  AST 15 15 13* 16  ALT 9 10 10 10   ALKPHOS 80 88 82 81  BILITOT 0.6 0.6 0.5 0.6  ALBUMIN 2.3* 2.4* 2.4* 2.3*  MG 2.0 2.0 1.9 1.9  CRP 2.7* 2.6* 2.5* 2.5*  BNP 84.1 37.7 27.3 40.5    ------------------------------------------------------------------------------------------------------------------ No results for input(s): CHOL, HDL, LDLCALC, TRIG, CHOLHDL, LDLDIRECT in the last 72 hours.  Lab Results  Component Value Date   HGBA1C 8.3 (H) 03/14/2021   ------------------------------------------------------------------------------------------------------------------ No results for input(s): TSH, T4TOTAL, T3FREE, THYROIDAB in the last 72 hours.  Invalid input(s): FREET3  Cardiac Enzymes No results for input(s): CKMB, TROPONINI, MYOGLOBIN in the last 168 hours.  Invalid input(s): CK ------------------------------------------------------------------------------------------------------------------    Component Value Date/Time   BNP 40.5 03/26/2021 0329    Micro Results Recent Results (from the past 240 hour(s))  Fungus Culture With Stain     Status: None (Preliminary result)   Collection Time: 03/19/21  3:07 PM   Specimen: Wound; Abscess  Result Value Ref Range Status   Fungus Stain Final report  Final    Comment: (NOTE) Performed At: [facility] [place], [place] [id number] Rush Farmer MD WT:[id number]    Fungus (Mycology) Culture PENDING  Incomplete   Fungal Source ABSCESS  Final    Comment: RIGHT CHEST Performed at [facility], [place] [address], [place], [place] [address]   Aerobic/Anaerobic Culture w Gram Stain (surgical/deep wound)     Status: None   Collection  Time: 03/19/21  3:07 PM   Specimen: Wound; Abscess  Result Value Ref Range Status   Specimen Description ABSCESS RIGHT CHEST  Final   Special Requests NONE  Final   Gram Stain   Final    FEW WBC PRESENT, PREDOMINANTLY PMN FEW GRAM POSITIVE COCCI IN PAIRS IN CLUSTERS    Culture   Final    FEW METHICILLIN RESISTANT STAPHYLOCOCCUS AUREUS NO ANAEROBES ISOLATED Performed at [facility], [place] [address] [place], [place] [address]    Report Status 03/24/2021 FINAL  Final   Organism ID, Bacteria METHICILLIN RESISTANT STAPHYLOCOCCUS AUREUS  Final      Susceptibility   Methicillin resistant staphylococcus aureus - MIC*    CIPROFLOXACIN >=8 RESISTANT Resistant     ERYTHROMYCIN >=8 RESISTANT Resistant     GENTAMICIN <=0.5 SENSITIVE Sensitive     OXACILLIN >=4 RESISTANT Resistant     TETRACYCLINE <=1 SENSITIVE Sensitive     VANCOMYCIN 1 SENSITIVE Sensitive     TRIMETH/SULFA >=320 RESISTANT Resistant     CLINDAMYCIN >=8 RESISTANT Resistant     RIFAMPIN <=0.5 SENSITIVE Sensitive     Inducible Clindamycin NEGATIVE Sensitive     * FEW METHICILLIN RESISTANT STAPHYLOCOCCUS AUREUS  Fungus Culture Result     Status: None   Collection Time: 03/19/21  3:07 PM  Result Value Ref Range Status   Result 1 Comment  Final  Comment: (NOTE) KOH/Calcofluor preparation:  no fungus observed. Performed At: [facility] [place], [place] [id number] Rush Farmer MD VV:[id number]     Radiology Reports CT CHEST W CONTRAST  Result Date: 03/22/2021 CLINICAL DATA:  70 year old male with chest pain and shortness of breath. EXAM: CT CHEST WITH CONTRAST TECHNIQUE: Multidetector CT imaging of the chest was performed during intravenous contrast administration. CONTRAST:  34mL OMNIPAQUE IOHEXOL 300 MG/ML  SOLN COMPARISON:  Chest CT dated 03/17/2021. FINDINGS: Cardiovascular: There is no cardiomegaly or pericardial effusion. There is 3 vessel coronary vascular calcification. There is mild  atherosclerotic calcification of the thoracic aorta. No aneurysmal dilatation or dissection. The origins of the great vessels of the aortic arch appear patent as visualized. No pulmonary artery embolus identified. Mediastinum/Nodes: No hilar or mediastinal adenopathy. The esophagus is grossly unremarkable. No mediastinal fluid collection. Lungs/Pleura: Small bilateral pleural effusions with partial compressive atelectasis of the lower lobes. Pneumonia is not excluded. Clinical correlation is recommended. No lobar consolidation or pneumothorax. The central airways are patent. Upper Abdomen: No acute abnormality. Musculoskeletal: Degenerative changes of the spine. There is an area of inflammatory changes with small pockets of air in the right anterior chest wall along the anterior right sixth rib. There is fragmentation of the anterior right sixth rib concerning for osteomyelitis. Clinical correlation is recommended. No drainable fluid collection or abscess identified. There is thickening of the adjacent pleura. There is a large skin wound in the lower right anterior chest wall with a wound VAC. IMPRESSION: 1. No CT evidence of pulmonary embolism. 2. Small bilateral pleural effusions with partial compressive atelectasis of the lower lobes. Pneumonia is not excluded. Clinical correlation is recommended. 3. Large skin wound in the lower right anterior chest wall with a wound VAC. 4. Destructive changes of the anterior right sixth rib concerning for osteomyelitis. Clinical correlation is recommended. No drainable fluid collection or abscess. 5. Aortic Atherosclerosis (ICD10-I70.0). Electronically Signed   By: Anner Crete M.D.   On: 03/22/2021 19:48   CT CHEST W CONTRAST  Result Date: 03/17/2021 CLINICAL DATA:  Infected right chest wall hematoma EXAM: CT CHEST WITH CONTRAST TECHNIQUE: Multidetector CT imaging of the chest was performed during intravenous contrast administration. CONTRAST:  75 mL OMNIPAQUE IOHEXOL  350 MG/ML SOLN COMPARISON:  03/13/2021 FINDINGS: Cardiovascular: Heart size within normal limits. Coronary artery calcifications seen throughout. No significant vascular abnormality identified. Mediastinum/Nodes: No enlarged mediastinal, hilar, or axillary lymph nodes. Lungs/Pleura: Trace bilateral pleural effusions with adjacent atelectasis. Upper Abdomen: No acute abnormality. Musculoskeletal: Previously seen right chest wall collection has decreased in size since prior examination, measuring approximately 7.4 x 7.1 x 3.7 cm compared to 10.1 x 9.1 x 5.7 cm on prior exam from 03/13/2021. The collection is somewhat bilobed. The inferior portion of the right chest wall collection has decreased to a greater degree than the superior lobulation. Greater amount of air within the collection likely due to presence of drains. Lucency and irregularity of the right sixth and seventh ribs, anterior segment again noted. IMPRESSION: 1. Interval decrease in size of bilobed right anterolateral chest wall fluid collection. The inferior lobulation is nearly completely resolved with some fat stranding and subcutaneous emphysema remaining. The more superior lobulation has decreased in size since prior study from 03/13/2021. The overall size of the collections now measures 7.4 x 7.1 x 3.7 cm compared to 10.1 x 9.1 x 5.7 cm on 03/13/2021. 2. Lucency in the anterior segments of the right sixth and seventh ribs again seen. Findings  are suspicious for osteomyelitis, however underlying pathologic fracture is also possible. Electronically Signed   By: Miachel Roux M.D.   On: 03/17/2021 14:05   CT Chest W Contrast  Result Date: 03/13/2021 CLINICAL DATA:  Follow-up right chest wall hematoma and fractures of the right sixth through eighth ribs. Diabetes. HIV. EXAM: CT CHEST WITH CONTRAST TECHNIQUE: Multidetector CT imaging of the chest was performed during intravenous contrast administration. CONTRAST:  114mL OMNIPAQUE IOHEXOL 300 MG/ML   SOLN COMPARISON:  03/09/2021 FINDINGS: Cardiovascular: Heart size remains normal. No pericardial fluid. Coronary artery calcification as seen previously. Aortic atherosclerotic calcification as seen previously. Mediastinum/Nodes: Normal Lungs/Pleura: Scarring/atelectasis at the lung bases left more than right unchanged since the previous study. Small areas of patchy density in the superior segment of the right lower lobe unchanged from the study of 4 days ago. No new or progressive pulmonary finding. No pneumothorax. Upper Abdomen: No upper abdominal injury or acute finding. Musculoskeletal: There is increasing size of the right chest wall collections, primarily centered around the right sixth and eighth anterolateral ribs. Those ribs show a pattern of irregular destruction that look more like pathologic fractures due to infection rather than traumatic fractures. Air bubbles are now present within the collections. There is mild extrapleural involvement in side of the ribcage, without frank breakthrough into the pleural space. Most of the collections manifest external to the ribcage. No new areas of involvement are seen. IMPRESSION: Increasing size of the right chest wall collections, primarily centered around the right sixth and eighth anterolateral ribs, with development of air bubbles. Those ribs show a pattern of irregular destruction that have progressed and look more like pathologic fractures due to infection rather than traumatic fractures. Air bubbles are now present within the collections and they probably represent abscesses. There is mild extrapleural involvement on the inner side of the ribcage, without frank breakthrough into the pleural space. The majority of the collections are on the outer side of the ribcage. No new areas of involvement are seen. Case discussed with Dr. Regenia Skeeter at 1419 hours. Aortic Atherosclerosis (ICD10-I70.0). Electronically Signed   By: Nelson Chimes M.D.   On: 03/13/2021 14:19    CT Chest W Contrast  Result Date: 03/09/2021 CLINICAL DATA:  Fall 3 weeks prior with right rib fractures and persistent chest pain. EXAM: CT CHEST WITH CONTRAST TECHNIQUE: Multidetector CT imaging of the chest was performed during intravenous contrast administration. CONTRAST:  29mL OMNIPAQUE IOHEXOL 300 MG/ML  SOLN COMPARISON:  Chest radiograph from earlier today. 11/22/2019 chest CT angiogram. FINDINGS: Cardiovascular: Normal heart size. No significant pericardial effusion/thickening. Three-vessel coronary atherosclerosis. Atherosclerotic nonaneurysmal thoracic aorta. Top-normal caliber main pulmonary artery (3.0 cm diameter). No central pulmonary emboli. Mediastinum/Nodes: No discrete thyroid nodules. Unremarkable esophagus. No pathologically enlarged axillary, mediastinal or hilar lymph nodes. Lungs/Pleura: No pneumothorax. No pleural effusion. No acute consolidative airspace disease, lung masses or significant pulmonary nodules. Mild platelike atelectasis in the anterior basilar left lower lobe. Mild patchy ground-glass opacity in peripheral posterior right mid lung. Upper abdomen: No acute abnormality. Musculoskeletal: No aggressive appearing focal osseous lesions. Nondisplaced acute anterior right sixth, seventh and eighth rib fractures with surrounding chest wall hematoma measuring up to 8.1 x 4.9 cm in maximum axial dimensions at the level of the anterior right sixth rib fracture (series 2/image 114). Moderate thoracic spondylosis. IMPRESSION: 1. Nondisplaced acute anterior right sixth, seventh and eighth rib fractures with surrounding chest wall hematoma. No pneumothorax or hemothorax. No discrete bone lesions. Clinical follow-up advised to ensure resolution  of the right chest wall hematoma. Any need for follow-up imaging should be based on clinical assessment. 2. Mild patchy ground-glass opacity in the peripheral posterior right mid lung, nonspecific, favor mild pulmonary contusion. 3. Mild  platelike atelectasis at the left lung base. 4. Three-vessel coronary atherosclerosis. 5. Aortic Atherosclerosis (ICD10-I70.0). Electronically Signed   By: Ilona Sorrel M.D.   On: 03/09/2021 13:03   IR US Guide Bx Asp/Drain  Result Date: 03/14/2021 INDICATION: 70 year old with history of fall and right rib fractures. Patient developed right chest hematomas. Patient is complaining of pain and recent CT demonstrates gas within the hematomas. Findings are concerning for infected hematomas. EXAM: PLACEMENT OF SUPERFICIAL RIGHT ANTERIOR CHEST WALL DRAIN USING ULTRASOUND GUIDANCE x 2 MEDICATIONS: Moderate sedation ANESTHESIA/SEDATION: Fentanyl 100 mcg IV; Versed 2.0 mg IV Moderate Sedation Time:  29 minutes The patient was continuously monitored during the procedure by the interventional radiology nurse under my direct supervision. COMPLICATIONS: None immediate. PROCEDURE: Informed written consent was obtained from the patient after a thorough discussion of the procedural risks, benefits and alternatives. All questions were addressed. Maximal Sterile Barrier Technique was utilized including caps, mask, sterile gowns, sterile gloves, sterile drape, hand hygiene and skin antiseptic. A timeout was performed prior to the initiation of the procedure. The right anterior chest wall hematomas were evaluated with ultrasound. Complex collections containing a small amount of fluid were obtained. The right anterior chest was prepped with chlorhexidine and sterile field was created. Attention was initially directed to the more inferior or caudal anterior chest wall collection. Skin was anesthetized with 1% lidocaine. Using ultrasound guidance, an 18 gauge trocar needle was directed into the collection and pink purulent fluid was aspirated. Superstiff Amplatz wire was advanced into the complex collection and the tract was dilated to accommodate a 10 [place] drain. Catheter was sutured to the skin and attached to a suction bulb. A  sample of fluid was sent for culture. A second area more cephalad was targeted with ultrasound guidance. Skin was anesthetized with 1% lidocaine. Using ultrasound guidance, an 18 gauge trocar needle was directed into the complex collection. Again, purulent fluid was aspirated. Superstiff Amplatz wire was advanced into this collection and a 10 French drain was placed. Additional purulent fluid was aspirated and the catheter was sutured to skin and attached to a suction bulb. Dressings were placed over both drains. FINDINGS: Patient has palpable hematomas in the right anterior chest and upper abdomen region. Ultrasound demonstrates heterogeneous collections in both areas containing a small amount of compressible fluid. There are echogenic areas within the collections compatible with known gas. 10 French drains were placed in both areas and purulent fluid was draining from both collections. Both collections are very complex and compatible with infected hematomas. IMPRESSION: Ultrasound-guided placement of 2 percutaneous drains within the superficial right anterior chest wall collections. Findings are compatible with infected hematomas. Fluid sample was sent for culture. Electronically Signed   By: Markus Daft M.D.   On: 03/14/2021 18:37   IR US Guide Bx Asp/Drain  Result Date: 03/14/2021 INDICATION: 70 year old with history of fall and right rib fractures. Patient developed right chest hematomas. Patient is complaining of pain and recent CT demonstrates gas within the hematomas. Findings are concerning for infected hematomas. EXAM: PLACEMENT OF SUPERFICIAL RIGHT ANTERIOR CHEST WALL DRAIN USING ULTRASOUND GUIDANCE x 2 MEDICATIONS: Moderate sedation ANESTHESIA/SEDATION: Fentanyl 100 mcg IV; Versed 2.0 mg IV Moderate Sedation Time:  29 minutes The patient was continuously monitored during the procedure by the interventional radiology  nurse under my direct supervision. COMPLICATIONS: None immediate. PROCEDURE: Informed  written consent was obtained from the patient after a thorough discussion of the procedural risks, benefits and alternatives. All questions were addressed. Maximal Sterile Barrier Technique was utilized including caps, mask, sterile gowns, sterile gloves, sterile drape, hand hygiene and skin antiseptic. A timeout was performed prior to the initiation of the procedure. The right anterior chest wall hematomas were evaluated with ultrasound. Complex collections containing a small amount of fluid were obtained. The right anterior chest was prepped with chlorhexidine and sterile field was created. Attention was initially directed to the more inferior or caudal anterior chest wall collection. Skin was anesthetized with 1% lidocaine. Using ultrasound guidance, an 18 gauge trocar needle was directed into the collection and pink purulent fluid was aspirated. Superstiff Amplatz wire was advanced into the complex collection and the tract was dilated to accommodate a 10 [place] drain. Catheter was sutured to the skin and attached to a suction bulb. A sample of fluid was sent for culture. A second area more cephalad was targeted with ultrasound guidance. Skin was anesthetized with 1% lidocaine. Using ultrasound guidance, an 18 gauge trocar needle was directed into the complex collection. Again, purulent fluid was aspirated. Superstiff Amplatz wire was advanced into this collection and a 10 French drain was placed. Additional purulent fluid was aspirated and the catheter was sutured to skin and attached to a suction bulb. Dressings were placed over both drains. FINDINGS: Patient has palpable hematomas in the right anterior chest and upper abdomen region. Ultrasound demonstrates heterogeneous collections in both areas containing a small amount of compressible fluid. There are echogenic areas within the collections compatible with known gas. 10 French drains were placed in both areas and purulent fluid was draining from both  collections. Both collections are very complex and compatible with infected hematomas. IMPRESSION: Ultrasound-guided placement of 2 percutaneous drains within the superficial right anterior chest wall collections. Findings are compatible with infected hematomas. Fluid sample was sent for culture. Electronically Signed   By: Markus Daft M.D.   On: 03/14/2021 18:37   DG Chest Port 1 View  Result Date: 03/13/2021 CLINICAL DATA:  Sepsis, right rib pain. EXAM: PORTABLE CHEST 1 VIEW COMPARISON:  March 09, 2021. FINDINGS: The heart size and mediastinal contours are within normal limits. Both lungs are clear. No pneumothorax or pleural effusion is noted. The visualized skeletal structures are unremarkable. IMPRESSION: No active disease. Electronically Signed   By: Marijo Conception M.D.   On: 03/13/2021 14:11   DG Chest Portable 1 View  Result Date: 03/09/2021 CLINICAL DATA:  Chest pain. Chest [place] hurting late yesterday. Complains of right-sided sternal pain and chest pain. EXAM: PORTABLE CHEST 1 VIEW COMPARISON:  None. FINDINGS: The heart size and mediastinal contours are within normal limits. Both lungs are clear. Blunting of the left costophrenic angle is noted, new from prior studies. The visualized skeletal structures are unremarkable. IMPRESSION: New blunting of left costophrenic angle may reflect small effusion. The lungs are otherwise clear. No signs of interstitial edema. Electronically Signed   By: Kerby Moors M.D.   On: 03/09/2021 11:26   [place] IMAGE GUIDED DRAINAGE BY PERCUTANEOUS CATHETER  Result Date: 03/18/2021 INDICATION: History of right-sided rib fractures complicated by development of infected adjacent hematomas, post ultrasound-guided placement of two percutaneous drainage catheters on 03/14/2021. Unfortunately, one of the percutaneous drainage catheters was inadvertently removed with subsequent chest CT performed 03/17/2021 demonstrating a persistent undrained complex fluid collection  involving the anterior  aspect of the right chest wall. As such, request made for ultrasound-guided aspiration and/or drainage catheter placement for infection source control purposes. EXAM: 1. ULTRASOUND-GUIDED RIGHT CHEST WALL PERCUTANEOUS DRAINAGE CATHETER PLACEMENT X2 2. ULTRASOUND-GUIDED RIGHT CHEST WALL ABSCESS ASPIRATION COMPARISON:  Chest CT-03/17/2021; 03/13/2021; ultrasound-guided right chest wall percutaneous drainage catheter placement x2-03/14/2021 MEDICATIONS: The patient is currently admitted to the hospital and receiving intravenous antibiotics. The antibiotics were administered within an appropriate time frame prior to the initiation of the procedure. ANESTHESIA/SEDATION: Moderate (conscious) sedation was employed during this procedure. A total of Versed 4 mg and Fentanyl 200 mcg was administered intravenously. Moderate Sedation Time: 41 minutes. The patient's level of consciousness and vital signs were monitored continuously by radiology nursing throughout the procedure under my direct supervision. CONTRAST:  None COMPLICATIONS: None immediate. PROCEDURE: Informed written consent was obtained from the patient after a discussion of the risks, benefits and alternatives to treatment. Preprocedural ultrasound scanning demonstrated complex fluid involving the anterior aspect of the right chest wall compatible with the findings seen on preceding chest CT. A timeout was performed prior to the initiation of the procedure. The skin overlying the right anterior chest was prepped and draped in the usual sterile fashion. The overlying soft tissues were anesthetized with 1% lidocaine with epinephrine. Under direct ultrasound guidance, a 18 gauge trocar needle was advanced into the complex abscess within the right chest wall, inferior to the nipple. A short Amplatz wire was coiled within the collection. The track was dilated ultimately allowing placement of a 10 French percutaneous catheter. Multiple ultrasound  images were saved procedural documentation purposes. Next, approximately 10 cc of purulent fluid was aspirated from the drainage catheter Sonographic evaluation demonstrates a residual complex fluid collection and as such the more medial component of the collection was accessed with an 18 gauge trocar needle. A short Amplatz wire was coiled within the collection and an additional 10 French percutaneous drainage catheter was placed under ultrasound guidance. Multiple ultrasound images were saved procedural documentation purposes. Next, approximately 30 cc of purulent fluid was aspirated from this more medially positioned chest wall drain. A representative sample was capped and sent to the laboratory for analysis. Next, the lateral component of the right chest wall collection was accessed with an 18 gauge trocar needle however only a small amount of bloody fluid was able to be aspirated. As such, a short Amplatz wire was coiled within the collection and the trocar needle was exchanged for a Yueh sheath catheter which was utilized to aspirate approximately 3 cc of thick bloody fluid. At this time, approximately 70 cc of additional blood-tinged purulent fluid was able to be expressed from the site of the previously removed percutaneous drainage catheter. Drainage catheters were secured at the entrance site within interrupted sutures and drainage catheters were connected to JP bulbs. Dressings were applied. The patient tolerated the procedure well without immediate postprocedural complication. IMPRESSION: 1. Successful ultrasound-guided placement of 2 two additional right anterior chest wall percutaneous drainage catheters yielding a total of 40 cc of purulent fluid. A representative aspirated sample was sent to the laboratory for analysis. 2. Successful ultrasound-guided aspiration of approximately 3 cc of thick bloody fluid from the more lateral component of the right anterior chest wall complex hematoma. 3. Successful  bedside expression of approximately 70 cc of purulent fluid from the entrance site of the recently removed percutaneous drainage catheter. Electronically Signed   By: Sandi Mariscal M.D.   On: 03/18/2021 15:29   [place] IMAGE GUIDED DRAINAGE BY  PERCUTANEOUS CATHETER  Result Date: 03/18/2021 INDICATION: History of right-sided rib fractures complicated by development of infected adjacent hematomas, post ultrasound-guided placement of two percutaneous drainage catheters on 03/14/2021. Unfortunately, one of the percutaneous drainage catheters was inadvertently removed with subsequent chest CT performed 03/17/2021 demonstrating a persistent undrained complex fluid collection involving the anterior aspect of the right chest wall. As such, request made for ultrasound-guided aspiration and/or drainage catheter placement for infection source control purposes. EXAM: 1. ULTRASOUND-GUIDED RIGHT CHEST WALL PERCUTANEOUS DRAINAGE CATHETER PLACEMENT X2 2. ULTRASOUND-GUIDED RIGHT CHEST WALL ABSCESS ASPIRATION COMPARISON:  Chest CT-03/17/2021; 03/13/2021; ultrasound-guided right chest wall percutaneous drainage catheter placement x2-03/14/2021 MEDICATIONS: The patient is currently admitted to the hospital and receiving intravenous antibiotics. The antibiotics were administered within an appropriate time frame prior to the initiation of the procedure. ANESTHESIA/SEDATION: Moderate (conscious) sedation was employed during this procedure. A total of Versed 4 mg and Fentanyl 200 mcg was administered intravenously. Moderate Sedation Time: 41 minutes. The patient's level of consciousness and vital signs were monitored continuously by radiology nursing throughout the procedure under my direct supervision. CONTRAST:  None COMPLICATIONS: None immediate. PROCEDURE: Informed written consent was obtained from the patient after a discussion of the risks, benefits and alternatives to treatment. Preprocedural ultrasound scanning demonstrated complex  fluid involving the anterior aspect of the right chest wall compatible with the findings seen on preceding chest CT. A timeout was performed prior to the initiation of the procedure. The skin overlying the right anterior chest was prepped and draped in the usual sterile fashion. The overlying soft tissues were anesthetized with 1% lidocaine with epinephrine. Under direct ultrasound guidance, a 18 gauge trocar needle was advanced into the complex abscess within the right chest wall, inferior to the nipple. A short Amplatz wire was coiled within the collection. The track was dilated ultimately allowing placement of a 10 French percutaneous catheter. Multiple ultrasound images were saved procedural documentation purposes. Next, approximately 10 cc of purulent fluid was aspirated from the drainage catheter Sonographic evaluation demonstrates a residual complex fluid collection and as such the more medial component of the collection was accessed with an 18 gauge trocar needle. A short Amplatz wire was coiled within the collection and an additional 10 French percutaneous drainage catheter was placed under ultrasound guidance. Multiple ultrasound images were saved procedural documentation purposes. Next, approximately 30 cc of purulent fluid was aspirated from this more medially positioned chest wall drain. A representative sample was capped and sent to the laboratory for analysis. Next, the lateral component of the right chest wall collection was accessed with an 18 gauge trocar needle however only a small amount of bloody fluid was able to be aspirated. As such, a short Amplatz wire was coiled within the collection and the trocar needle was exchanged for a Yueh sheath catheter which was utilized to aspirate approximately 3 cc of thick bloody fluid. At this time, approximately 70 cc of additional blood-tinged purulent fluid was able to be expressed from the site of the previously removed percutaneous drainage catheter.  Drainage catheters were secured at the entrance site within interrupted sutures and drainage catheters were connected to JP bulbs. Dressings were applied. The patient tolerated the procedure well without immediate postprocedural complication. IMPRESSION: 1. Successful ultrasound-guided placement of 2 two additional right anterior chest wall percutaneous drainage catheters yielding a total of 40 cc of purulent fluid. A representative aspirated sample was sent to the laboratory for analysis. 2. Successful ultrasound-guided aspiration of approximately 3 cc of thick bloody fluid from  the more lateral component of the right anterior chest wall complex hematoma. 3. Successful bedside expression of approximately 70 cc of purulent fluid from the entrance site of the recently removed percutaneous drainage catheter. Electronically Signed   By: Sandi Mariscal M.D.   On: 03/18/2021 15:29   [place] FINE NEEDLE ASP 1ST LESION  Result Date: 03/18/2021 INDICATION: History of right-sided rib fractures complicated by development of infected adjacent hematomas, post ultrasound-guided placement of two percutaneous drainage catheters on 03/14/2021. Unfortunately, one of the percutaneous drainage catheters was inadvertently removed with subsequent chest CT performed 03/17/2021 demonstrating a persistent undrained complex fluid collection involving the anterior aspect of the right chest wall. As such, request made for ultrasound-guided aspiration and/or drainage catheter placement for infection source control purposes. EXAM: 1. ULTRASOUND-GUIDED RIGHT CHEST WALL PERCUTANEOUS DRAINAGE CATHETER PLACEMENT X2 2. ULTRASOUND-GUIDED RIGHT CHEST WALL ABSCESS ASPIRATION COMPARISON:  Chest CT-03/17/2021; 03/13/2021; ultrasound-guided right chest wall percutaneous drainage catheter placement x2-03/14/2021 MEDICATIONS: The patient is currently admitted to the hospital and receiving intravenous antibiotics. The antibiotics were administered within an  appropriate time frame prior to the initiation of the procedure. ANESTHESIA/SEDATION: Moderate (conscious) sedation was employed during this procedure. A total of Versed 4 mg and Fentanyl 200 mcg was administered intravenously. Moderate Sedation Time: 41 minutes. The patient's level of consciousness and vital signs were monitored continuously by radiology nursing throughout the procedure under my direct supervision. CONTRAST:  None COMPLICATIONS: None immediate. PROCEDURE: Informed written consent was obtained from the patient after a discussion of the risks, benefits and alternatives to treatment. Preprocedural ultrasound scanning demonstrated complex fluid involving the anterior aspect of the right chest wall compatible with the findings seen on preceding chest CT. A timeout was performed prior to the initiation of the procedure. The skin overlying the right anterior chest was prepped and draped in the usual sterile fashion. The overlying soft tissues were anesthetized with 1% lidocaine with epinephrine. Under direct ultrasound guidance, a 18 gauge trocar needle was advanced into the complex abscess within the right chest wall, inferior to the nipple. A short Amplatz wire was coiled within the collection. The track was dilated ultimately allowing placement of a 10 French percutaneous catheter. Multiple ultrasound images were saved procedural documentation purposes. Next, approximately 10 cc of purulent fluid was aspirated from the drainage catheter Sonographic evaluation demonstrates a residual complex fluid collection and as such the more medial component of the collection was accessed with an 18 gauge trocar needle. A short Amplatz wire was coiled within the collection and an additional 10 French percutaneous drainage catheter was placed under ultrasound guidance. Multiple ultrasound images were saved procedural documentation purposes. Next, approximately 30 cc of purulent fluid was aspirated from this more  medially positioned chest wall drain. A representative sample was capped and sent to the laboratory for analysis. Next, the lateral component of the right chest wall collection was accessed with an 18 gauge trocar needle however only a small amount of bloody fluid was able to be aspirated. As such, a short Amplatz wire was coiled within the collection and the trocar needle was exchanged for a Yueh sheath catheter which was utilized to aspirate approximately 3 cc of thick bloody fluid. At this time, approximately 70 cc of additional blood-tinged purulent fluid was able to be expressed from the site of the previously removed percutaneous drainage catheter. Drainage catheters were secured at the entrance site within interrupted sutures and drainage catheters were connected to JP bulbs. Dressings were applied. The patient tolerated the procedure  well without immediate postprocedural complication. IMPRESSION: 1. Successful ultrasound-guided placement of 2 two additional right anterior chest wall percutaneous drainage catheters yielding a total of 40 cc of purulent fluid. A representative aspirated sample was sent to the laboratory for analysis. 2. Successful ultrasound-guided aspiration of approximately 3 cc of thick bloody fluid from the more lateral component of the right anterior chest wall complex hematoma. 3. Successful bedside expression of approximately 70 cc of purulent fluid from the entrance site of the recently removed percutaneous drainage catheter. Electronically Signed   By: Sandi Mariscal M.D.   On: 03/18/2021 15:29

## 2021-03-29 NOTE — Progress Notes (Signed)
Patient sat up in chair and then walked around unit. Patient stated that his pain was a little more controlled at the moment.

## 2021-03-30 DIAGNOSIS — B182 Chronic viral hepatitis C: Secondary | ICD-10-CM | POA: Diagnosis not present

## 2021-03-30 DIAGNOSIS — A419 Sepsis, unspecified organism: Secondary | ICD-10-CM | POA: Diagnosis not present

## 2021-03-30 DIAGNOSIS — I10 Essential (primary) hypertension: Secondary | ICD-10-CM | POA: Diagnosis not present

## 2021-03-30 DIAGNOSIS — J869 Pyothorax without fistula: Secondary | ICD-10-CM | POA: Diagnosis not present

## 2021-03-30 LAB — GLUCOSE, CAPILLARY
Glucose-Capillary: 127 mg/dL — ABNORMAL HIGH (ref 70–99)
Glucose-Capillary: 90 mg/dL (ref 70–99)
Glucose-Capillary: 186 mg/dL — ABNORMAL HIGH (ref 70–99)
Glucose-Capillary: 116 mg/dL — ABNORMAL HIGH (ref 70–99)
Glucose-Capillary: 175 mg/dL — ABNORMAL HIGH (ref 70–99)

## 2021-03-30 MED ORDER — OXYCODONE HCL 5 MG PO TABS
5.0000 mg | ORAL_TABLET | ORAL | Status: DC | PRN
  Administered 2021-03-30 – 2021-04-10 (×56): 5 mg via ORAL
  Filled 2021-03-30 (×57): qty 1

## 2021-03-30 MED ORDER — OXYCODONE-ACETAMINOPHEN 5-325 MG PO TABS
1.0000 | ORAL_TABLET | ORAL | Status: DC | PRN
  Administered 2021-03-30 – 2021-04-10 (×56): 1 via ORAL
  Filled 2021-03-30 (×57): qty 1
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
## 2021-03-30 NOTE — Consult Note (Signed)
[facility] Nurse wound follow up Patient receiving care in [facility] 5W06. Wound type: Right chest surgical wound; wound matrix in wound bed Measurement: deferred, measured Friday Wound bed: granulation tissue and cellular matrix Drainage (amount, consistency, odor) serosanginous in canister Periwound: intact Dressing procedure/placement/frequency: 2 pieces of black foam removed from wound bed. One piece of black foam cut to fit wound bed, drape applied, immediate seal obtained. OF NOTE: patient was premedicated with IV pain meds, I liberally soaked the foam, and slowly and gently peeled the foam away to remove the old foam. At the conclusion, the patient stated this wound dressing change was "much, much better" than the one Friday, and that if he could he would give me a hug. Val Riles, RN, MSN, CWOCN, CNS-BC, pager [phone]

## 2021-03-30 NOTE — NC FL2 (Signed)
[place] LEVEL OF CARE SCREENING TOOL     IDENTIFICATION  Patient Name: Brian Malone Birthdate: 01-01-1951 Sex: male Admission Date (Current Location): 03/13/2021  [facility] and [place] Number:  Herbalist and Address:  [facility] [place] [address] [place], [place] [address]      Provider Number: [id number]  Attending Physician Name and Address:  Elgergawy, Silver Huguenin, MD  Relative Name and Phone Number:       Current Level of Care: Hospital Recommended Level of Care: [place] Prior Approval Number:    Date Approved/Denied:   PASRR Number: [id number]  Discharge Plan: SNF    Current Diagnoses: Patient Active Problem List   Diagnosis Date Noted  . Osteomyelitis ([place])   . Protein-calorie malnutrition, severe 03/19/2021  . Sepsis ([place]) 03/13/2021  . Abscess of chest wall 03/13/2021  . Mixed diabetic hyperlipidemia associated with type 2 diabetes mellitus ([place]) 03/13/2021  . GERD without esophagitis 03/13/2021  . BPH (benign prostatic hyperplasia) 03/13/2021  . Chronic hepatitis C without hepatic coma ([place]) 03/13/2021  . Malnutrition of moderate degree 11/05/2020  . Weakness of right upper extremity 11/04/2020  . Abscess 11/04/2020  . CAD (coronary artery disease) 05/26/2020  . Dysphagia 06/20/2019  . AKI (acute kidney injury) ([place]) 06/20/2019  . HCAP (healthcare-associated pneumonia) 06/20/2019  . Lactic acidosis 06/20/2019  . Cervical spinal stenosis 05/18/2019  . Lumbar spondylosis 05/18/2019  . Sacroiliitis ([place]) 05/15/2019  . HIV (human immunodeficiency virus infection) ([place]) 05/13/2019  . Wound infection after surgery 04/15/2019  . Benign prostatic hyperplasia (BPH) with straining on urination 02/22/2019  . Essential hypertension 02/22/2019  . Post-traumatic osteoarthritis of both ankles 02/22/2019  . Trigeminal neuralgia 02/22/2019  . Uncontrolled type 2 diabetes mellitus with hyperglycemia  ([place]) 02/22/2019  . Chronic bilateral low back pain with bilateral sciatica 01/19/2019  . Diabetic autonomic neuropathy associated with type 2 diabetes mellitus ([place]) 01/19/2019  . Migraine with aura and without status migrainosus, not intractable 01/19/2019  . Neck pain 01/19/2019  . Neuropathy of both feet 01/19/2019  . Nocturia more than twice per night 01/07/2015  . Anxiety disorder 10/23/2014  . Depression 10/23/2014  . Insomnia 10/23/2014  . Rash of entire body 10/23/2014    Orientation RESPIRATION BLADDER Height & Weight     Self,Time,Situation,Place  Normal Continent Weight: 187 lb 13.3 oz (85.2 kg) Height:  6' (182.9 cm)  BEHAVIORAL SYMPTOMS/MOOD NEUROLOGICAL BOWEL NUTRITION STATUS      Continent Diet (Please see DC Summary)  AMBULATORY STATUS COMMUNICATION OF NEEDS Skin   Independent Verbally Surgical wounds,Other (Comment),Wound Vac (Closed incision on chest; non-presure wound on flank with wound vac)                       Personal Care Assistance Level of Assistance   (Independent) Bathing Assistance: Independent         Functional Limitations Info             SPECIAL CARE FACTORS FREQUENCY   (IV antibiotics)                    Contractures Contractures Info: Not present    Additional Factors Info  Code Status,Allergies,Isolation Precautions,Psychotropic,Insulin Sliding Scale Code Status Info: Full Allergies Info: Adhesive (Tape), Cyclobenzaprine, Duloxetine Hcl, Morphine Psychotropic Info: Klonopin; Buspar Insulin Sliding Scale Info: see dc summary Isolation Precautions Info: MRSA; Staph Aureus     Current Medications (03/30/2021):  This is  the current hospital active medication list Current Facility-Administered Medications  Medication Dose Route Frequency Provider Last Rate Last Admin  . (feeding supplement) PROSource Plus liquid 30 mL  30 mL Oral TID WC Thurnell Lose, MD   30 mL at 03/30/21 1357  . 0.9 %  sodium chloride infusion    Intravenous PRN Vernelle Emerald, MD 50 mL/hr at 03/24/21 2342 New Bag at 03/24/21 2342  . abacavir-dolutegravir-lamiVUDine (TRIUMEQ) [id number] MG per tablet 1 tablet  1 tablet Oral Daily Shalhoub, Sherryll Burger, MD   1 tablet at 03/30/21 0817  . acetaminophen (TYLENOL) tablet 650 mg  650 mg Oral Q6H PRN Vernelle Emerald, MD   650 mg at 03/15/21 2028  . antiseptic oral rinse (BIOTENE) solution 15 mL  15 mL Mouth Rinse PRN Thurnell Lose, MD      . busPIRone (BUSPAR) tablet 30 mg  30 mg Oral BID Vernelle Emerald, MD   30 mg at 03/30/21 0813  . clonazePAM (KLONOPIN) disintegrating tablet 0.5 mg  0.5 mg Oral TID Thurnell Lose, MD   0.5 mg at 03/30/21 5465  . DAPTOmycin (CUBICIN) 700 mg in sodium chloride 0.9 % IVPB  8 mg/kg Intravenous Q24H Rosiland Oz, MD   Stopped at 03/30/21 0724  . docusate sodium (COLACE) capsule 100 mg  100 mg Oral Daily PRN Chotiner, Yevonne Aline, MD   100 mg at 03/29/21 2346  . ezetimibe (ZETIA) tablet 10 mg  10 mg Oral Daily Shalhoub, Sherryll Burger, MD   10 mg at 03/30/21 6812  . feeding supplement (ENSURE ENLIVE / ENSURE PLUS) liquid 237 mL  237 mL Oral BID BM Thurnell Lose, MD   237 mL at 03/30/21 0830  . feeding supplement (GLUCERNA SHAKE) (GLUCERNA SHAKE) liquid 237 mL  237 mL Oral Q24H Thurnell Lose, MD   237 mL at 03/29/21 1258  . fentaNYL (DURAGESIC) 25 MCG/HR 1 patch  1 patch Transdermal Q72H Elgergawy, Silver Huguenin, MD   1 patch at 03/28/21 1650  . Glecaprevir-Pibrentasivir 100-40 mg (Mavyret) tabs 3 tablets - patient's own supply  3 tablet Oral Daily Thurnell Lose, MD   3 tablet at 03/30/21 1238  . heparin injection 5,000 Units  5,000 Units Subcutaneous Q8H Monia Sabal, PA-C   5,000 Units at 03/30/21 1356  . HYDROmorphone (DILAUDID) injection 1.5 mg  1.5 mg Intravenous PRN Elgergawy, Silver Huguenin, MD   1.5 mg at 03/30/21 0830  . insulin aspart (novoLOG) injection 0-15 Units  0-15 Units Subcutaneous TID AC & HS Shalhoub, Sherryll Burger, MD   3 Units at 03/30/21  1215  . lactated ringers infusion   Intravenous Continuous Josephine Igo, MD 10 mL/hr at 03/25/21 1007 New Bag at 03/25/21 1007  . [START ON 04/10/2021] linezolid (ZYVOX) tablet 600 mg  600 mg Oral Q12H Manandhar, Collene Mares, MD      . metoprolol tartrate (LOPRESSOR) tablet 25 mg  25 mg Oral BID Thurnell Lose, MD   25 mg at 03/30/21 0813  . multivitamin with minerals tablet 1 tablet  1 tablet Oral Daily Thurnell Lose, MD   1 tablet at 03/29/21 1954  . naloxone (NARCAN) injection 0.4 mg  0.4 mg Intravenous PRN Thurnell Lose, MD      . ondansetron [facility]) injection 4 mg  4 mg Intravenous Q6H PRN Shalhoub, Sherryll Burger, MD   4 mg at 03/27/21 1600  . oxyCODONE-acetaminophen (PERCOCET/ROXICET) 5-325 MG per tablet 1 tablet  1 tablet Oral Q4H PRN Elgergawy,  Silver Huguenin, MD   1 tablet at 03/30/21 1221   And  . oxyCODONE (Oxy IR/ROXICODONE) immediate release tablet 5 mg  5 mg Oral Q4H PRN Elgergawy, Silver Huguenin, MD   5 mg at 03/30/21 1356  . pantoprazole (PROTONIX) EC tablet 40 mg  40 mg Oral QAC breakfast Shalhoub, Sherryll Burger, MD   40 mg at 03/30/21 0758  . polyethylene glycol (MIRALAX / GLYCOLAX) packet 17 g  17 g Oral Daily PRN Shalhoub, Sherryll Burger, MD      . tamsulosin (FLOMAX) capsule 0.4 mg  0.4 mg Oral QHS Shalhoub, Sherryll Burger, MD   0.4 mg at 03/29/21 2136  . tiZANidine (ZANAFLEX) tablet 2 mg  2 mg Oral QHS Thurnell Lose, MD   2 mg at 03/29/21 2136  . valACYclovir (VALTREX) tablet 1,000 mg  1,000 mg Oral TID PRN Thurnell Lose, MD         Discharge Medications: Please see discharge summary for a list of discharge medications.  Relevant Imaging Results:  Relevant Lab Results:   Additional Information SSN: [id number]. Requires IV Daptomycin until 4/21. Has received [employer] on 03/07/20, [place] on 12/09/20  Benard Halsted, LCSW

## 2021-03-30 NOTE — Progress Notes (Signed)
PROGRESS NOTE                                                                                                                                                                                                             Patient Demographics:    Brian Malone, is a 70 y.o. male, DOB - 1951-12-02, NLZ:[id number]  Outpatient Primary MD for the patient is [name], [name], MD    LOS - 17  Admit date - 03/13/2021    Chief Complaint  Patient presents with  . Rib Injury       Brief Narrative (HPI from H&P)    70 year old male with past medical history of hepatitis C, hypertension, hyperlipidemia, diabetes mellitus type 2, chronic pain syndrome, intravenous drug use (abstinent since 2019), major depressive disorder and HIV who had a trip and fall in his yard on 02/22/2021 after which he hurt his right-sided chest wall, at that time he was diagnosed with right-sided rib cage fracture and injury, he was seen in the ER a few times since then and was treated conservatively, his pain and discomfort continued to get worse and he presented again to [place] on  03/13/2021 CT scan suggested that he had right chest wall abscess formation and cellulitis with concerns for sepsis.,  Patient went for drain placement by IR with minimal improvement, seen by CT surgery, went to the OR 03/19/2021 with open incision and drainage and wound VAC, back to the OR 4/6 for wound VAC exchange and some debridement, remains on IV daptomycin.   Subjective:   Patient reports he tolerated wound VAC dressing change today well with current pain regimen.   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  - ID consulted, will need IV daptomycin total time 3 weeks from 03/19/2021, then to continue with  p.o. Zyvox as an outpatient till seen by ID clinic. - Encouraged the patient to sit up in chair in the daytime use I-S and flutter valve for pulmonary toiletry.  Will advance activity and titrate down oxygen as possible. -Discussed pain regimen with the patient, continue with current dose fentanyl patch, I will discontinue his IV Dilaudid but will increase his oxycodone to every 4 hours.  .. - he reports significant pain with dressing changes, so he received IV Dilaudid prior to that.   Chronic pain and narcotic use.   Supportive care as needed for acute discomfort, avoid overuse, as needed Narcan added.  Patient counseled , please see above discussion  HIV - Continue home regimen follows with [facility] ID department.  Anxiety and depression.  Home medications continued.  Currently not homicidal or suicidal.  GERD.  On PPI.  BPH.  On Flomax.  AKI.  Due to ATN from sepsis, resolved after hydration, gentle hydration again on 03/24/2021.  Hypertension.  Blood pressure soft, blood pressure medicine has been discontinued.  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  On sliding scale monitor and adjust  Lab Results  Component Value Date   HGBA1C 8.3 (H) 03/14/2021   CBG (last 3)  Recent Labs    03/30/21 0455 03/30/21 0756 03/30/21 1143  GLUCAP 127* 90 186*          Condition - Extremely Guarded  Family Communication  : None present bedside  Code Status :  Full  Consults  :  IR, cardiothoracic surgery, ID  PUD Prophylaxis : PPI   Procedures  :     Right chest wall  I&D by Dr. Kipp Brood 7/00/17 -Repeat application of wound VAC and incision and drainage on 4/6  US guided chest wall abscess drined by IR 03/14/21   CT -  Increasing size of the right chest wall collections, primarily centered around the right sixth and eighth anterolateral ribs, with development of air bubbles. Those ribs show a pattern of irregular destruction that have progressed and look more like pathologic fractures due to infection rather than traumatic fractures. Air bubbles are now present within the collections and they probably represent abscesses. There is mild extrapleural involvement on the inner side of the ribcage, without frank breakthrough into the pleural space. The majority of the collections are on the outer side of the ribcage. No new areas of involvement are seen.  CT repeat 03/17/21 - 1. Interval decrease in size of bilobed right anterolateral chest wall fluid collection. The inferior lobulation is nearly completely resolved with some fat stranding and subcutaneous emphysema remaining. The more superior lobulation has decreased in size since prior study from 03/13/2021. The overall size of the collections now measures 7.4 x 7.1 x 3.7 cm compared to 10.1 x 9.1 x 5.7 cm on 03/13/2021. 2. Lucency in the anterior segments of the right sixth and seventh ribs again seen. Findings are suspicious for osteomyelitis, however underlying pathologic fracture is also possible.      Disposition Plan  :    Status is: Inpatient  Remains inpatient appropriate because:IV treatments appropriate due to intensity of illness or inability to take PO   Dispo: The patient is from: Home              Anticipated d/c is to: Home              Patient currently is not medically stable to d/c.   Difficult to place patient No  DVT  Prophylaxis  :   Heparin   Lab Results  Component Value Date   PLT 332 03/26/2021    Diet :  Diet Order            Diet Carb Modified Fluid consistency: Thin; Room  service appropriate? Yes  Diet effective now                  Inpatient Medications  Scheduled Meds: . (feeding supplement) PROSource Plus  30 mL Oral TID WC  . abacavir-dolutegravir-lamiVUDine  1 tablet Oral Daily  . busPIRone  30 mg Oral BID  . clonazepam  0.5 mg Oral TID  . ezetimibe  10 mg Oral Daily  . feeding supplement  237 mL Oral BID BM  . feeding supplement (GLUCERNA SHAKE)  237 mL Oral Q24H  . fentaNYL  1 patch Transdermal Q72H  . Glecaprevir-Pibrentasvir  3 tablet Oral Daily  . heparin injection (subcutaneous)  5,000 Units Subcutaneous Q8H  . insulin aspart  0-15 Units Subcutaneous TID AC & HS  . [START ON 04/10/2021] linezolid  600 mg Oral Q12H  . metoprolol tartrate  25 mg Oral BID  . multivitamin with minerals  1 tablet Oral Daily  . pantoprazole  40 mg Oral QAC breakfast  . tamsulosin  0.4 mg Oral QHS  . tiZANidine  2 mg Oral QHS   Continuous Infusions: . sodium chloride 50 mL/hr at 03/24/21 2342  . DAPTOmycin (CUBICIN)  IV Stopped (03/30/21 0724)  . lactated ringers 10 mL/hr at 03/25/21 1007   PRN Meds:.sodium chloride, acetaminophen **OR** [DISCONTINUED] acetaminophen, antiseptic oral rinse, docusate sodium, HYDROmorphone (DILAUDID) injection, naLOXone (NARCAN)  injection, [DISCONTINUED] ondansetron **OR** ondansetron (ZOFRAN) IV, oxyCODONE-acetaminophen **AND** oxyCODONE, polyethylene glycol, valACYclovir  Antibiotics  :    Anti-infectives (From admission, onward)   Start     Dose/Rate Route Frequency Ordered Stop   04/10/21 1000  linezolid (ZYVOX) tablet 600 mg        600 mg Oral Every 12 hours 03/27/21 1410 05/01/21 0959   03/27/21 0000  linezolid (ZYVOX) 600 MG tablet       Note to Pharmacy: Patient staying through next week for IV abx - Just working ahead.   600 mg Oral 2 times daily 03/27/21 0933 04/17/21 2359   03/23/21 2000  DAPTOmycin (CUBICIN) 700 mg in sodium chloride 0.9 % IVPB        8 mg/kg  85.2 kg 128 mL/hr over 30 Minutes Intravenous  Every 24 hours 03/23/21 1133 04/09/21 2359   03/22/21 1200  vancomycin (VANCOREADY) IVPB 1250 mg/250 mL  Status:  Discontinued        1,250 mg 166.7 mL/hr over 90 Minutes Intravenous Every 24 hours 03/21/21 1319 03/23/21 1133   03/20/21 1000  Glecaprevir-Pibrentasivir 100-40 mg (Mavyret) tabs 3 tablets - patient's own supply        3 tablet Oral Daily 03/20/21 0733     03/15/21 1200  vancomycin (VANCOREADY) IVPB 1500 mg/300 mL  Status:  Discontinued        1,500 mg 150 mL/hr over 120 Minutes Intravenous Every 24 hours 03/15/21 0732 03/21/21 1319   03/14/21 1200  vancomycin (VANCOREADY) IVPB 1250 mg/250 mL  Status:  Discontinued        1,250 mg 166.7 mL/hr over 90 Minutes Intravenous Every 24 hours 03/13/21 1359 03/15/21 0732   03/14/21 1023  valACYclovir (VALTREX) tablet 1,000 mg       Note to Pharmacy: Trig neuralgia flare     1,000 mg Oral  3 times daily PRN 03/14/21 1023     03/14/21 1000  abacavir-dolutegravir-lamiVUDine (TRIUMEQ) 600-50-300 MG per tablet 1 tablet        1 tablet Oral Daily 03/13/21 2320     03/14/21 0200  meropenem (MERREM) 1 g in sodium chloride 0.9 % 100 mL IVPB  Status:  Discontinued        1 g 200 mL/hr over 30 Minutes Intravenous Every 8 hours 03/13/21 2337 03/16/21 0719   03/14/21 0100  ceFEPIme (MAXIPIME) 2 g in sodium chloride 0.9 % 100 mL IVPB  Status:  Discontinued       Note to Pharmacy: Cefepime 2 g IV q12h for CrCl < 60 mL/min   2 g 200 mL/hr over 30 Minutes Intravenous Every 12 hours 03/13/21 2326 03/13/21 2331   03/13/21 2000  piperacillin-tazobactam (ZOSYN) IVPB 3.375 g  Status:  Discontinued        3.375 g 12.5 mL/hr over 240 Minutes Intravenous Every 8 hours 03/13/21 1402 03/13/21 2320   03/13/21 1430  clindamycin (CLEOCIN) IVPB 900 mg        900 mg 100 mL/hr over 30 Minutes Intravenous  Once 03/13/21 1418 03/13/21 1548   03/13/21 1230  vancomycin (VANCOCIN) IVPB 1000 mg/200 mL premix        1,000 mg 200 mL/hr over 60 Minutes Intravenous  Once  03/13/21 1229 03/13/21 1506   03/13/21 1230  piperacillin-tazobactam (ZOSYN) IVPB 3.375 g        3.375 g 100 mL/hr over 30 Minutes Intravenous  Once 03/13/21 1229 03/13/21 1400       Rolonda Pontarelli M.D on 03/30/2021 at 11:54 AM  To page go to [email or web address]   [facility] -  Office  [phone]    See all Orders from today for further details    Objective:   Vitals:   03/29/21 1427 03/29/21 2048 03/30/21 0452 03/30/21 1140  BP: 112/68 111/68 104/62 115/72  Pulse: 79 79 66 75  Resp: 19 19 18 17   Temp: 98.1 F (36.7 C) 97.8 F (36.6 C) 97.7 F (36.5 C) 97.7 F (36.5 C)  TempSrc: Oral Oral Oral Oral  SpO2: 100% 99% 100% 99%  Weight:      Height:        Wt Readings from Last 3 Encounters:  03/25/21 85.2 kg  03/09/21 77.1 kg  01/22/21 72.6 kg     Intake/Output Summary (Last 24 hours) at 03/30/2021 1154 Last data filed at 03/30/2021 1139 Gross per 24 hour  Intake 1024 ml  Output 450 ml  Net 574 ml   Physical Exam  Awake Alert, Oriented X 3, No new F.N deficits, Normal affect Symmetrical Chest wall movement, Good air movement bilaterally, wound VAC to right lower chest RRR,No Gallops,Rubs or new Murmurs, No Parasternal Heave +ve B.Sounds, Abd Soft, No tenderness, No rebound - guarding or rigidity. No Cyanosis, Clubbing or edema, No new Rash or bruise      Data Review:    CBC Recent Labs  Lab 03/24/21 0542 03/25/21 0032 03/26/21 0329  WBC 8.0 6.5 5.4  HGB 10.2* 10.2* 9.5*  HCT 31.2* 31.4* 29.4*  PLT 391 364 332  MCV 100.0 99.4 99.3  MCH 32.7 32.3 32.1  MCHC 32.7 32.5 32.3  RDW 13.3 13.3 13.4  LYMPHSABS 1.7 1.5 1.4  MONOABS 0.7 0.7 0.6  EOSABS 0.3 0.2 0.2  BASOSABS 0.1 0.0 0.0    Recent Labs  Lab 03/24/21 0542 03/25/21 0032 03/26/21 0329  NA 136 137 137  K 4.8 4.5 4.7  CL 97* 97* 99  CO2 34* 33* 32  GLUCOSE 130* 173* 207*  BUN 28* 29* 23  CREATININE 1.29* 1.36* 1.28*  CALCIUM 9.1 8.9 8.7*  AST 15 13* 16  ALT 10 10 10    ALKPHOS 88 82 81  BILITOT 0.6 0.5 0.6  ALBUMIN 2.4* 2.4* 2.3*  MG 2.0 1.9 1.9  CRP 2.6* 2.5* 2.5*  BNP 37.7 27.3 40.5    ------------------------------------------------------------------------------------------------------------------ No results for input(s): CHOL, HDL, LDLCALC, TRIG, CHOLHDL, LDLDIRECT in the last 72 hours.  Lab Results  Component Value Date   HGBA1C 8.3 (H) 03/14/2021   ------------------------------------------------------------------------------------------------------------------ No results for input(s): TSH, T4TOTAL, T3FREE, THYROIDAB in the last 72 hours.  Invalid input(s): FREET3  Cardiac Enzymes No results for input(s): CKMB, TROPONINI, MYOGLOBIN in the last 168 hours.  Invalid input(s): CK ------------------------------------------------------------------------------------------------------------------    Component Value Date/Time   BNP 40.5 03/26/2021 0329    Micro Results No results found for this or any previous visit (from the past 240 hour(s)).  Radiology Reports CT CHEST W CONTRAST  Result Date: 03/22/2021 CLINICAL DATA:  70 year old male with chest pain and shortness of breath. EXAM: CT CHEST WITH CONTRAST TECHNIQUE: Multidetector CT imaging of the chest was performed during intravenous contrast administration. CONTRAST:  55mL OMNIPAQUE IOHEXOL 300 MG/ML  SOLN COMPARISON:  Chest CT dated 03/17/2021. FINDINGS: Cardiovascular: There is no cardiomegaly or pericardial effusion. There is 3 vessel coronary vascular calcification. There is mild atherosclerotic calcification of the thoracic aorta. No aneurysmal dilatation or dissection. The origins of the great vessels of the aortic arch appear patent as visualized. No pulmonary artery embolus identified. Mediastinum/Nodes: No hilar or mediastinal adenopathy. The esophagus is grossly unremarkable. No mediastinal fluid collection. Lungs/Pleura: Small bilateral pleural effusions with partial compressive  atelectasis of the lower lobes. Pneumonia is not excluded. Clinical correlation is recommended. No lobar consolidation or pneumothorax. The central airways are patent. Upper Abdomen: No acute abnormality. Musculoskeletal: Degenerative changes of the spine. There is an area of inflammatory changes with small pockets of air in the right anterior chest wall along the anterior right sixth rib. There is fragmentation of the anterior right sixth rib concerning for osteomyelitis. Clinical correlation is recommended. No drainable fluid collection or abscess identified. There is thickening of the adjacent pleura. There is a large skin wound in the lower right anterior chest wall with a wound VAC. IMPRESSION: 1. No CT evidence of pulmonary embolism. 2. Small bilateral pleural effusions with partial compressive atelectasis of the lower lobes. Pneumonia is not excluded. Clinical correlation is recommended. 3. Large skin wound in the lower right anterior chest wall with a wound VAC. 4. Destructive changes of the anterior right sixth rib concerning for osteomyelitis. Clinical correlation is recommended. No drainable fluid collection or abscess. 5. Aortic Atherosclerosis (ICD10-I70.0). Electronically Signed   By: Anner Crete M.D.   On: 03/22/2021 19:48   CT CHEST W CONTRAST  Result Date: 03/17/2021 CLINICAL DATA:  Infected right chest wall hematoma EXAM: CT CHEST WITH CONTRAST TECHNIQUE: Multidetector CT imaging of the chest was performed during intravenous contrast administration. CONTRAST:  75 mL OMNIPAQUE IOHEXOL 350 MG/ML SOLN COMPARISON:  03/13/2021 FINDINGS: Cardiovascular: Heart size within normal limits. Coronary artery calcifications seen throughout. No significant vascular abnormality identified. Mediastinum/Nodes: No enlarged mediastinal, hilar, or axillary lymph nodes. Lungs/Pleura: Trace bilateral pleural effusions with adjacent atelectasis. Upper Abdomen: No acute abnormality. Musculoskeletal: Previously  seen right chest wall collection has decreased in size since prior examination, measuring approximately  7.4 x 7.1 x 3.7 cm compared to 10.1 x 9.1 x 5.7 cm on prior exam from 03/13/2021. The collection is somewhat bilobed. The inferior portion of the right chest wall collection has decreased to a greater degree than the superior lobulation. Greater amount of air within the collection likely due to presence of drains. Lucency and irregularity of the right sixth and seventh ribs, anterior segment again noted. IMPRESSION: 1. Interval decrease in size of bilobed right anterolateral chest wall fluid collection. The inferior lobulation is nearly completely resolved with some fat stranding and subcutaneous emphysema remaining. The more superior lobulation has decreased in size since prior study from 03/13/2021. The overall size of the collections now measures 7.4 x 7.1 x 3.7 cm compared to 10.1 x 9.1 x 5.7 cm on 03/13/2021. 2. Lucency in the anterior segments of the right sixth and seventh ribs again seen. Findings are suspicious for osteomyelitis, however underlying pathologic fracture is also possible. Electronically Signed   By: Miachel Roux M.D.   On: 03/17/2021 14:05   CT Chest W Contrast  Result Date: 03/13/2021 CLINICAL DATA:  Follow-up right chest wall hematoma and fractures of the right sixth through eighth ribs. Diabetes. HIV. EXAM: CT CHEST WITH CONTRAST TECHNIQUE: Multidetector CT imaging of the chest was performed during intravenous contrast administration. CONTRAST:  140mL OMNIPAQUE IOHEXOL 300 MG/ML  SOLN COMPARISON:  03/09/2021 FINDINGS: Cardiovascular: Heart size remains normal. No pericardial fluid. Coronary artery calcification as seen previously. Aortic atherosclerotic calcification as seen previously. Mediastinum/Nodes: Normal Lungs/Pleura: Scarring/atelectasis at the lung bases left more than right unchanged since the previous study. Small areas of patchy density in the superior segment of the  right lower lobe unchanged from the study of 4 days ago. No new or progressive pulmonary finding. No pneumothorax. Upper Abdomen: No upper abdominal injury or acute finding. Musculoskeletal: There is increasing size of the right chest wall collections, primarily centered around the right sixth and eighth anterolateral ribs. Those ribs show a pattern of irregular destruction that look more like pathologic fractures due to infection rather than traumatic fractures. Air bubbles are now present within the collections. There is mild extrapleural involvement in side of the ribcage, without frank breakthrough into the pleural space. Most of the collections manifest external to the ribcage. No new areas of involvement are seen. IMPRESSION: Increasing size of the right chest wall collections, primarily centered around the right sixth and eighth anterolateral ribs, with development of air bubbles. Those ribs show a pattern of irregular destruction that have progressed and look more like pathologic fractures due to infection rather than traumatic fractures. Air bubbles are now present within the collections and they probably represent abscesses. There is mild extrapleural involvement on the inner side of the ribcage, without frank breakthrough into the pleural space. The majority of the collections are on the outer side of the ribcage. No new areas of involvement are seen. Case discussed with Dr. Regenia Skeeter at 1419 hours. Aortic Atherosclerosis (ICD10-I70.0). Electronically Signed   By: Nelson Chimes M.D.   On: 03/13/2021 14:19   CT Chest W Contrast  Result Date: 03/09/2021 CLINICAL DATA:  Fall 3 weeks prior with right rib fractures and persistent chest pain. EXAM: CT CHEST WITH CONTRAST TECHNIQUE: Multidetector CT imaging of the chest was performed during intravenous contrast administration. CONTRAST:  51mL OMNIPAQUE IOHEXOL 300 MG/ML  SOLN COMPARISON:  Chest radiograph from earlier today. 11/22/2019 chest CT angiogram.  FINDINGS: Cardiovascular: Normal heart size. No significant pericardial effusion/thickening. Three-vessel coronary atherosclerosis. Atherosclerotic nonaneurysmal  thoracic aorta. Top-normal caliber main pulmonary artery (3.0 cm diameter). No central pulmonary emboli. Mediastinum/Nodes: No discrete thyroid nodules. Unremarkable esophagus. No pathologically enlarged axillary, mediastinal or hilar lymph nodes. Lungs/Pleura: No pneumothorax. No pleural effusion. No acute consolidative airspace disease, lung masses or significant pulmonary nodules. Mild platelike atelectasis in the anterior basilar left lower lobe. Mild patchy ground-glass opacity in peripheral posterior right mid lung. Upper abdomen: No acute abnormality. Musculoskeletal: No aggressive appearing focal osseous lesions. Nondisplaced acute anterior right sixth, seventh and eighth rib fractures with surrounding chest wall hematoma measuring up to 8.1 x 4.9 cm in maximum axial dimensions at the level of the anterior right sixth rib fracture (series 2/image 114). Moderate thoracic spondylosis. IMPRESSION: 1. Nondisplaced acute anterior right sixth, seventh and eighth rib fractures with surrounding chest wall hematoma. No pneumothorax or hemothorax. No discrete bone lesions. Clinical follow-up advised to ensure resolution of the right chest wall hematoma. Any need for follow-up imaging should be based on clinical assessment. 2. Mild patchy ground-glass opacity in the peripheral posterior right mid lung, nonspecific, favor mild pulmonary contusion. 3. Mild platelike atelectasis at the left lung base. 4. Three-vessel coronary atherosclerosis. 5. Aortic Atherosclerosis (ICD10-I70.0). Electronically Signed   By: Ilona Sorrel M.D.   On: 03/09/2021 13:03   IR US Guide Bx Asp/Drain  Result Date: 03/14/2021 INDICATION: 70 year old with history of fall and right rib fractures. Patient developed right chest hematomas. Patient is complaining of pain and recent CT  demonstrates gas within the hematomas. Findings are concerning for infected hematomas. EXAM: PLACEMENT OF SUPERFICIAL RIGHT ANTERIOR CHEST WALL DRAIN USING ULTRASOUND GUIDANCE x 2 MEDICATIONS: Moderate sedation ANESTHESIA/SEDATION: Fentanyl 100 mcg IV; Versed 2.0 mg IV Moderate Sedation Time:  29 minutes The patient was continuously monitored during the procedure by the interventional radiology nurse under my direct supervision. COMPLICATIONS: None immediate. PROCEDURE: Informed written consent was obtained from the patient after a thorough discussion of the procedural risks, benefits and alternatives. All questions were addressed. Maximal Sterile Barrier Technique was utilized including caps, mask, sterile gowns, sterile gloves, sterile drape, hand hygiene and skin antiseptic. A timeout was performed prior to the initiation of the procedure. The right anterior chest wall hematomas were evaluated with ultrasound. Complex collections containing a small amount of fluid were obtained. The right anterior chest was prepped with chlorhexidine and sterile field was created. Attention was initially directed to the more inferior or caudal anterior chest wall collection. Skin was anesthetized with 1% lidocaine. Using ultrasound guidance, an 18 gauge trocar needle was directed into the collection and pink purulent fluid was aspirated. Superstiff Amplatz wire was advanced into the complex collection and the tract was dilated to accommodate a 10 [place] drain. Catheter was sutured to the skin and attached to a suction bulb. A sample of fluid was sent for culture. A second area more cephalad was targeted with ultrasound guidance. Skin was anesthetized with 1% lidocaine. Using ultrasound guidance, an 18 gauge trocar needle was directed into the complex collection. Again, purulent fluid was aspirated. Superstiff Amplatz wire was advanced into this collection and a 10 French drain was placed. Additional purulent fluid was aspirated  and the catheter was sutured to skin and attached to a suction bulb. Dressings were placed over both drains. FINDINGS: Patient has palpable hematomas in the right anterior chest and upper abdomen region. Ultrasound demonstrates heterogeneous collections in both areas containing a small amount of compressible fluid. There are echogenic areas within the collections compatible with known gas. 10 French drains were  placed in both areas and purulent fluid was draining from both collections. Both collections are very complex and compatible with infected hematomas. IMPRESSION: Ultrasound-guided placement of 2 percutaneous drains within the superficial right anterior chest wall collections. Findings are compatible with infected hematomas. Fluid sample was sent for culture. Electronically Signed   By: Markus Daft M.D.   On: 03/14/2021 18:37   IR US Guide Bx Asp/Drain  Result Date: 03/14/2021 INDICATION: 70 year old with history of fall and right rib fractures. Patient developed right chest hematomas. Patient is complaining of pain and recent CT demonstrates gas within the hematomas. Findings are concerning for infected hematomas. EXAM: PLACEMENT OF SUPERFICIAL RIGHT ANTERIOR CHEST WALL DRAIN USING ULTRASOUND GUIDANCE x 2 MEDICATIONS: Moderate sedation ANESTHESIA/SEDATION: Fentanyl 100 mcg IV; Versed 2.0 mg IV Moderate Sedation Time:  29 minutes The patient was continuously monitored during the procedure by the interventional radiology nurse under my direct supervision. COMPLICATIONS: None immediate. PROCEDURE: Informed written consent was obtained from the patient after a thorough discussion of the procedural risks, benefits and alternatives. All questions were addressed. Maximal Sterile Barrier Technique was utilized including caps, mask, sterile gowns, sterile gloves, sterile drape, hand hygiene and skin antiseptic. A timeout was performed prior to the initiation of the procedure. The right anterior chest wall hematomas  were evaluated with ultrasound. Complex collections containing a small amount of fluid were obtained. The right anterior chest was prepped with chlorhexidine and sterile field was created. Attention was initially directed to the more inferior or caudal anterior chest wall collection. Skin was anesthetized with 1% lidocaine. Using ultrasound guidance, an 18 gauge trocar needle was directed into the collection and pink purulent fluid was aspirated. Superstiff Amplatz wire was advanced into the complex collection and the tract was dilated to accommodate a 10 [place] drain. Catheter was sutured to the skin and attached to a suction bulb. A sample of fluid was sent for culture. A second area more cephalad was targeted with ultrasound guidance. Skin was anesthetized with 1% lidocaine. Using ultrasound guidance, an 18 gauge trocar needle was directed into the complex collection. Again, purulent fluid was aspirated. Superstiff Amplatz wire was advanced into this collection and a 10 French drain was placed. Additional purulent fluid was aspirated and the catheter was sutured to skin and attached to a suction bulb. Dressings were placed over both drains. FINDINGS: Patient has palpable hematomas in the right anterior chest and upper abdomen region. Ultrasound demonstrates heterogeneous collections in both areas containing a small amount of compressible fluid. There are echogenic areas within the collections compatible with known gas. 10 French drains were placed in both areas and purulent fluid was draining from both collections. Both collections are very complex and compatible with infected hematomas. IMPRESSION: Ultrasound-guided placement of 2 percutaneous drains within the superficial right anterior chest wall collections. Findings are compatible with infected hematomas. Fluid sample was sent for culture. Electronically Signed   By: Markus Daft M.D.   On: 03/14/2021 18:37   DG Chest Port 1 View  Result Date:  03/13/2021 CLINICAL DATA:  Sepsis, right rib pain. EXAM: PORTABLE CHEST 1 VIEW COMPARISON:  March 09, 2021. FINDINGS: The heart size and mediastinal contours are within normal limits. Both lungs are clear. No pneumothorax or pleural effusion is noted. The visualized skeletal structures are unremarkable. IMPRESSION: No active disease. Electronically Signed   By: Marijo Conception M.D.   On: 03/13/2021 14:11   DG Chest Portable 1 View  Result Date: 03/09/2021 CLINICAL DATA:  Chest pain.  Chest [place] hurting late yesterday. Complains of right-sided sternal pain and chest pain. EXAM: PORTABLE CHEST 1 VIEW COMPARISON:  None. FINDINGS: The heart size and mediastinal contours are within normal limits. Both lungs are clear. Blunting of the left costophrenic angle is noted, new from prior studies. The visualized skeletal structures are unremarkable. IMPRESSION: New blunting of left costophrenic angle may reflect small effusion. The lungs are otherwise clear. No signs of interstitial edema. Electronically Signed   By: Kerby Moors M.D.   On: 03/09/2021 11:26   [place] IMAGE GUIDED DRAINAGE BY PERCUTANEOUS CATHETER  Result Date: 03/18/2021 INDICATION: History of right-sided rib fractures complicated by development of infected adjacent hematomas, post ultrasound-guided placement of two percutaneous drainage catheters on 03/14/2021. Unfortunately, one of the percutaneous drainage catheters was inadvertently removed with subsequent chest CT performed 03/17/2021 demonstrating a persistent undrained complex fluid collection involving the anterior aspect of the right chest wall. As such, request made for ultrasound-guided aspiration and/or drainage catheter placement for infection source control purposes. EXAM: 1. ULTRASOUND-GUIDED RIGHT CHEST WALL PERCUTANEOUS DRAINAGE CATHETER PLACEMENT X2 2. ULTRASOUND-GUIDED RIGHT CHEST WALL ABSCESS ASPIRATION COMPARISON:  Chest CT-03/17/2021; 03/13/2021; ultrasound-guided right chest wall  percutaneous drainage catheter placement x2-03/14/2021 MEDICATIONS: The patient is currently admitted to the hospital and receiving intravenous antibiotics. The antibiotics were administered within an appropriate time frame prior to the initiation of the procedure. ANESTHESIA/SEDATION: Moderate (conscious) sedation was employed during this procedure. A total of Versed 4 mg and Fentanyl 200 mcg was administered intravenously. Moderate Sedation Time: 41 minutes. The patient's level of consciousness and vital signs were monitored continuously by radiology nursing throughout the procedure under my direct supervision. CONTRAST:  None COMPLICATIONS: None immediate. PROCEDURE: Informed written consent was obtained from the patient after a discussion of the risks, benefits and alternatives to treatment. Preprocedural ultrasound scanning demonstrated complex fluid involving the anterior aspect of the right chest wall compatible with the findings seen on preceding chest CT. A timeout was performed prior to the initiation of the procedure. The skin overlying the right anterior chest was prepped and draped in the usual sterile fashion. The overlying soft tissues were anesthetized with 1% lidocaine with epinephrine. Under direct ultrasound guidance, a 18 gauge trocar needle was advanced into the complex abscess within the right chest wall, inferior to the nipple. A short Amplatz wire was coiled within the collection. The track was dilated ultimately allowing placement of a 10 French percutaneous catheter. Multiple ultrasound images were saved procedural documentation purposes. Next, approximately 10 cc of purulent fluid was aspirated from the drainage catheter Sonographic evaluation demonstrates a residual complex fluid collection and as such the more medial component of the collection was accessed with an 18 gauge trocar needle. A short Amplatz wire was coiled within the collection and an additional 10 French percutaneous  drainage catheter was placed under ultrasound guidance. Multiple ultrasound images were saved procedural documentation purposes. Next, approximately 30 cc of purulent fluid was aspirated from this more medially positioned chest wall drain. A representative sample was capped and sent to the laboratory for analysis. Next, the lateral component of the right chest wall collection was accessed with an 18 gauge trocar needle however only a small amount of bloody fluid was able to be aspirated. As such, a short Amplatz wire was coiled within the collection and the trocar needle was exchanged for a Yueh sheath catheter which was utilized to aspirate approximately 3 cc of thick bloody fluid. At this time, approximately 70 cc of additional blood-tinged purulent fluid  was able to be expressed from the site of the previously removed percutaneous drainage catheter. Drainage catheters were secured at the entrance site within interrupted sutures and drainage catheters were connected to JP bulbs. Dressings were applied. The patient tolerated the procedure well without immediate postprocedural complication. IMPRESSION: 1. Successful ultrasound-guided placement of 2 two additional right anterior chest wall percutaneous drainage catheters yielding a total of 40 cc of purulent fluid. A representative aspirated sample was sent to the laboratory for analysis. 2. Successful ultrasound-guided aspiration of approximately 3 cc of thick bloody fluid from the more lateral component of the right anterior chest wall complex hematoma. 3. Successful bedside expression of approximately 70 cc of purulent fluid from the entrance site of the recently removed percutaneous drainage catheter. Electronically Signed   By: Sandi Mariscal M.D.   On: 03/18/2021 15:29   [place] IMAGE GUIDED DRAINAGE BY PERCUTANEOUS CATHETER  Result Date: 03/18/2021 INDICATION: History of right-sided rib fractures complicated by development of infected adjacent hematomas, post  ultrasound-guided placement of two percutaneous drainage catheters on 03/14/2021. Unfortunately, one of the percutaneous drainage catheters was inadvertently removed with subsequent chest CT performed 03/17/2021 demonstrating a persistent undrained complex fluid collection involving the anterior aspect of the right chest wall. As such, request made for ultrasound-guided aspiration and/or drainage catheter placement for infection source control purposes. EXAM: 1. ULTRASOUND-GUIDED RIGHT CHEST WALL PERCUTANEOUS DRAINAGE CATHETER PLACEMENT X2 2. ULTRASOUND-GUIDED RIGHT CHEST WALL ABSCESS ASPIRATION COMPARISON:  Chest CT-03/17/2021; 03/13/2021; ultrasound-guided right chest wall percutaneous drainage catheter placement x2-03/14/2021 MEDICATIONS: The patient is currently admitted to the hospital and receiving intravenous antibiotics. The antibiotics were administered within an appropriate time frame prior to the initiation of the procedure. ANESTHESIA/SEDATION: Moderate (conscious) sedation was employed during this procedure. A total of Versed 4 mg and Fentanyl 200 mcg was administered intravenously. Moderate Sedation Time: 41 minutes. The patient's level of consciousness and vital signs were monitored continuously by radiology nursing throughout the procedure under my direct supervision. CONTRAST:  None COMPLICATIONS: None immediate. PROCEDURE: Informed written consent was obtained from the patient after a discussion of the risks, benefits and alternatives to treatment. Preprocedural ultrasound scanning demonstrated complex fluid involving the anterior aspect of the right chest wall compatible with the findings seen on preceding chest CT. A timeout was performed prior to the initiation of the procedure. The skin overlying the right anterior chest was prepped and draped in the usual sterile fashion. The overlying soft tissues were anesthetized with 1% lidocaine with epinephrine. Under direct ultrasound guidance, a 18  gauge trocar needle was advanced into the complex abscess within the right chest wall, inferior to the nipple. A short Amplatz wire was coiled within the collection. The track was dilated ultimately allowing placement of a 10 French percutaneous catheter. Multiple ultrasound images were saved procedural documentation purposes. Next, approximately 10 cc of purulent fluid was aspirated from the drainage catheter Sonographic evaluation demonstrates a residual complex fluid collection and as such the more medial component of the collection was accessed with an 18 gauge trocar needle. A short Amplatz wire was coiled within the collection and an additional 10 French percutaneous drainage catheter was placed under ultrasound guidance. Multiple ultrasound images were saved procedural documentation purposes. Next, approximately 30 cc of purulent fluid was aspirated from this more medially positioned chest wall drain. A representative sample was capped and sent to the laboratory for analysis. Next, the lateral component of the right chest wall collection was accessed with an 18 gauge trocar needle however only a  small amount of bloody fluid was able to be aspirated. As such, a short Amplatz wire was coiled within the collection and the trocar needle was exchanged for a Yueh sheath catheter which was utilized to aspirate approximately 3 cc of thick bloody fluid. At this time, approximately 70 cc of additional blood-tinged purulent fluid was able to be expressed from the site of the previously removed percutaneous drainage catheter. Drainage catheters were secured at the entrance site within interrupted sutures and drainage catheters were connected to JP bulbs. Dressings were applied. The patient tolerated the procedure well without immediate postprocedural complication. IMPRESSION: 1. Successful ultrasound-guided placement of 2 two additional right anterior chest wall percutaneous drainage catheters yielding a total of 40 cc  of purulent fluid. A representative aspirated sample was sent to the laboratory for analysis. 2. Successful ultrasound-guided aspiration of approximately 3 cc of thick bloody fluid from the more lateral component of the right anterior chest wall complex hematoma. 3. Successful bedside expression of approximately 70 cc of purulent fluid from the entrance site of the recently removed percutaneous drainage catheter. Electronically Signed   By: Sandi Mariscal M.D.   On: 03/18/2021 15:29   [place] FINE NEEDLE ASP 1ST LESION  Result Date: 03/18/2021 INDICATION: History of right-sided rib fractures complicated by development of infected adjacent hematomas, post ultrasound-guided placement of two percutaneous drainage catheters on 03/14/2021. Unfortunately, one of the percutaneous drainage catheters was inadvertently removed with subsequent chest CT performed 03/17/2021 demonstrating a persistent undrained complex fluid collection involving the anterior aspect of the right chest wall. As such, request made for ultrasound-guided aspiration and/or drainage catheter placement for infection source control purposes. EXAM: 1. ULTRASOUND-GUIDED RIGHT CHEST WALL PERCUTANEOUS DRAINAGE CATHETER PLACEMENT X2 2. ULTRASOUND-GUIDED RIGHT CHEST WALL ABSCESS ASPIRATION COMPARISON:  Chest CT-03/17/2021; 03/13/2021; ultrasound-guided right chest wall percutaneous drainage catheter placement x2-03/14/2021 MEDICATIONS: The patient is currently admitted to the hospital and receiving intravenous antibiotics. The antibiotics were administered within an appropriate time frame prior to the initiation of the procedure. ANESTHESIA/SEDATION: Moderate (conscious) sedation was employed during this procedure. A total of Versed 4 mg and Fentanyl 200 mcg was administered intravenously. Moderate Sedation Time: 41 minutes. The patient's level of consciousness and vital signs were monitored continuously by radiology nursing throughout the procedure under my  direct supervision. CONTRAST:  None COMPLICATIONS: None immediate. PROCEDURE: Informed written consent was obtained from the patient after a discussion of the risks, benefits and alternatives to treatment. Preprocedural ultrasound scanning demonstrated complex fluid involving the anterior aspect of the right chest wall compatible with the findings seen on preceding chest CT. A timeout was performed prior to the initiation of the procedure. The skin overlying the right anterior chest was prepped and draped in the usual sterile fashion. The overlying soft tissues were anesthetized with 1% lidocaine with epinephrine. Under direct ultrasound guidance, a 18 gauge trocar needle was advanced into the complex abscess within the right chest wall, inferior to the nipple. A short Amplatz wire was coiled within the collection. The track was dilated ultimately allowing placement of a 10 French percutaneous catheter. Multiple ultrasound images were saved procedural documentation purposes. Next, approximately 10 cc of purulent fluid was aspirated from the drainage catheter Sonographic evaluation demonstrates a residual complex fluid collection and as such the more medial component of the collection was accessed with an 18 gauge trocar needle. A short Amplatz wire was coiled within the collection and an additional 10 French percutaneous drainage catheter was placed under ultrasound guidance. Multiple ultrasound images were  saved procedural documentation purposes. Next, approximately 30 cc of purulent fluid was aspirated from this more medially positioned chest wall drain. A representative sample was capped and sent to the laboratory for analysis. Next, the lateral component of the right chest wall collection was accessed with an 18 gauge trocar needle however only a small amount of bloody fluid was able to be aspirated. As such, a short Amplatz wire was coiled within the collection and the trocar needle was exchanged for a Yueh  sheath catheter which was utilized to aspirate approximately 3 cc of thick bloody fluid. At this time, approximately 70 cc of additional blood-tinged purulent fluid was able to be expressed from the site of the previously removed percutaneous drainage catheter. Drainage catheters were secured at the entrance site within interrupted sutures and drainage catheters were connected to JP bulbs. Dressings were applied. The patient tolerated the procedure well without immediate postprocedural complication. IMPRESSION: 1. Successful ultrasound-guided placement of 2 two additional right anterior chest wall percutaneous drainage catheters yielding a total of 40 cc of purulent fluid. A representative aspirated sample was sent to the laboratory for analysis. 2. Successful ultrasound-guided aspiration of approximately 3 cc of thick bloody fluid from the more lateral component of the right anterior chest wall complex hematoma. 3. Successful bedside expression of approximately 70 cc of purulent fluid from the entrance site of the recently removed percutaneous drainage catheter. Electronically Signed   By: Sandi Mariscal M.D.   On: 03/18/2021 15:29

## 2021-03-31 DIAGNOSIS — A419 Sepsis, unspecified organism: Secondary | ICD-10-CM | POA: Diagnosis not present

## 2021-03-31 DIAGNOSIS — R6520 Severe sepsis without septic shock: Secondary | ICD-10-CM | POA: Diagnosis not present

## 2021-03-31 DIAGNOSIS — N179 Acute kidney failure, unspecified: Secondary | ICD-10-CM | POA: Diagnosis not present

## 2021-03-31 DIAGNOSIS — J869 Pyothorax without fistula: Secondary | ICD-10-CM | POA: Diagnosis not present

## 2021-03-31 LAB — CBC
HCT: 30.1 % — ABNORMAL LOW (ref 39.0–52.0)
Hemoglobin: 9.8 g/dL — ABNORMAL LOW (ref 13.0–17.0)
MCH: 32 pg (ref 26.0–34.0)
MCHC: 32.6 g/dL (ref 30.0–36.0)
MCV: 98.4 fL (ref 80.0–100.0)
Platelets: 271 10*3/uL (ref 150–400)
RBC: 3.06 MIL/uL — ABNORMAL LOW (ref 4.22–5.81)
RDW: 14.1 % (ref 11.5–15.5)
WBC: 5.2 10*3/uL (ref 4.0–10.5)
nRBC: 0 % (ref 0.0–0.2)

## 2021-03-31 LAB — BASIC METABOLIC PANEL
Anion gap: 10 (ref 5–15)
BUN: 33 mg/dL — ABNORMAL HIGH (ref 8–23)
CO2: 29 mmol/L (ref 22–32)
Calcium: 9.2 mg/dL (ref 8.9–10.3)
Chloride: 97 mmol/L — ABNORMAL LOW (ref 98–111)
Creatinine, Ser: 1.38 mg/dL — ABNORMAL HIGH (ref 0.61–1.24)
GFR, Estimated: 56 mL/min — ABNORMAL LOW (ref >60)
Glucose, Bld: 128 mg/dL — ABNORMAL HIGH (ref 70–99)
Potassium: 4.8 mmol/L (ref 3.5–5.1)
Sodium: 136 mmol/L (ref 135–145)

## 2021-03-31 LAB — GLUCOSE, CAPILLARY
Glucose-Capillary: 135 mg/dL — ABNORMAL HIGH (ref 70–99)
Glucose-Capillary: 86 mg/dL (ref 70–99)
Glucose-Capillary: 197 mg/dL — ABNORMAL HIGH (ref 70–99)
Glucose-Capillary: 172 mg/dL — ABNORMAL HIGH (ref 70–99)
Glucose-Capillary: 151 mg/dL — ABNORMAL HIGH (ref 70–99)

## 2021-03-31 LAB — CK: Total CK: 40 U/L — ABNORMAL LOW (ref 49–397)

## 2021-03-31 MED ORDER — CLONAZEPAM 0.5 MG PO TBDP
0.5000 mg | ORAL_TABLET | Freq: Four times a day (QID) | ORAL | Status: DC
  Administered 2021-03-31 – 2021-04-10 (×41): 0.5 mg via ORAL
  Filled 2021-03-31 (×41): qty 1

## 2021-03-31 MED ORDER — PROSOURCE PLUS PO LIQD
30.0000 mL | Freq: Two times a day (BID) | ORAL | Status: DC | PRN
  Filled 2021-03-31: qty 30

## 2021-03-31 MED ORDER — ENSURE ENLIVE PO LIQD: 237.0000 mL | Freq: Every day | ORAL | Status: DC | PRN

## 2021-03-31 NOTE — Progress Notes (Signed)
Nutrition Follow-up  DOCUMENTATION CODES:  Severe malnutrition in context of acute illness/injury  INTERVENTION:   Glucerna Shake po once daily, each supplement provides 220 kcal and 10 grams of protein  Ensure Enlive po BID, each supplement provides 350 kcal and 20 grams of protein  Vital Cuisine Shake TID with meals, each supplement provides 520 kcal and 22 grams of protein  MVI with minerals daily  Encourage POs  Add prn prosource and ensure enlive for pt to consume as he desires outside of scheduled supplements  Request new measured weight  NUTRITION DIAGNOSIS:  Severe Malnutrition related to acute illness (rib fractures S/P surgeries and drain placement causing pain) as evidenced by severe muscle depletion,severe fat depletion.  Ongoing  GOAL:  Patient will meet greater than or equal to 90% of their needs  Progressing  MONITOR:  PO intake,Supplement acceptance,Labs  REASON FOR ASSESSMENT:  Malnutrition Screening Tool    ASSESSMENT:  70 yo male admitted with chest wall abscess formation and cellulitis. Recent R side rib cage fracture S/P fall 02/22/21. PMH includes hepatitis C, HTN, HLD, DM-2, chronic pain syndrome, IVDU (abstinent since 2019), MDD, HIV.    Pt resting in bed at the time of visit. States that intake has overall been good since last assessment now that his pain is under better control. 75% average intake x 5 recorded meals since last assessment (50-100% consumed). Discussed supplements. Pt is routinely consuming the ensures and glucernas. Several prosource plus packets noted at bedside. Pt reports that he was receiving them too often, but would like to have the ability to receive one after he is caught up with consuming them. Will add as a prn order. Wound vac remains in place at this time.   3/31 - Op, chest wall debridement, placement of MatriDerm cell matrix, wound vac placed 4/6 - Op, chest wall debridement, wound vac change.  Labs reviewed.  CBG:  86-186 over the last 24 hours BUN 33, creatinine 1.38  Medications reviewed and include Novolog SSI, MVI with minerals, colace ordered prn but being administered  Nutrition Focused Physical Exam Flowsheet Row Most Recent Value  Orbital Region Severe depletion  Upper Arm Region Severe depletion  Thoracic and Lumbar Region Moderate depletion  Buccal Region Severe depletion  Temple Region Severe depletion  Clavicle Bone Region Severe depletion  Clavicle and Acromion Bone Region Severe depletion  Scapular Bone Region Unable to assess  Dorsal Hand Moderate depletion  Patellar Region Moderate depletion  Anterior Thigh Region Moderate depletion  Posterior Calf Region Moderate depletion  Edema (RD Assessment) None  Hair Reviewed  [thin]  Eyes Reviewed  Mouth Reviewed  [email or web address] dry]  Skin Reviewed  Nails Reviewed     Diet Order:   Diet Order            Diet Carb Modified Fluid consistency: Thin; Room service appropriate? Yes  Diet effective now                EDUCATION NEEDS:  Education needs have been addressed  Skin:  Skin Assessment: Skin Integrity Issues: Skin Integrity Issues:: Wound VAC Wound Vac: R chest wall abscess S/P I&D x 2 Other: R chest wall abscess  Last BM:  4/12 - per RN documentation , type 2  Height:  Ht Readings from Last 1 Encounters:  03/25/21 6' (1.829 m)    Weight:  Wt Readings from Last 1 Encounters:  03/25/21 85.2 kg    Ideal Body Weight:  80.9 kg  BMI:  Body mass  index is 25.47 kg/m.  Estimated Nutritional Needs:   Kcal:  2050-2250  Protein:  110-125 gm  Fluid:  >/= 2 L    Ranell Patrick, RD, LDN Clinical Dietitian Pager on [place]

## 2021-03-31 NOTE — Progress Notes (Signed)
PROGRESS NOTE                                                                                                                                                                                                             Patient Demographics:    Brian Malone, is a 70 y.o. male, DOB - [birth year], TIW:[id number]  Outpatient Primary MD for the patient is Brian Malone, Brian Grief, MD    LOS - 18  Admit date - 03/13/2021    Chief Complaint  Patient presents with  . Rib Injury       Brief Narrative (HPI from H&P)    70 year old male with past medical history of hepatitis C, hypertension, hyperlipidemia, diabetes mellitus type 2, chronic pain syndrome, intravenous drug use (abstinent since 2019), major depressive disorder and HIV who had a trip and fall in his yard on 02/22/2021 after which he hurt his right-sided chest wall, at that time he was diagnosed with right-sided rib cage fracture and injury, he was seen in the ER a few times since then and was treated conservatively, his pain and discomfort continued to get worse and he presented again to [facility] on  03/13/2021 CT scan suggested that he had right chest wall abscess formation and cellulitis with concerns for sepsis.,  Patient went for drain placement by IR with minimal improvement, seen by CT surgery, went to the OR 03/19/2021 with open incision and drainage and wound VAC, back to the OR 4/6 for wound VAC exchange and some debridement, remains on IV daptomycin.   Subjective:   Patient reports his pain is currently controlled, asking if his Klonopin can be changed to home dose 4 times daily.   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  - ID consulted, will need IV daptomycin total time 3 weeks  from 03/19/2021, then to continue with p.o. Zyvox as an outpatient till seen by ID clinic. - Encouraged the patient to sit up in chair in the daytime use I-S and flutter valve for pulmonary toiletry.  Will advance activity and titrate down oxygen as possible. -Discussed pain regimen with the patient, continue with current dose fentanyl patch, his pain is currently controlled on current regimen of oxycodone 10 mg every 4 hours as needed.  He is currently off IV fentanyl, pain is controlled on current regimen of oxycodone 10 mg every 4 hours as needed. - he reports significant pain with dressing changes, so he received IV Dilaudid prior to that. -Change with history of IV drug abuse in remote past, will be high risk for PICC line at home, discussed with social worker to see if able to have facility for the time he is on IV antibiotics.  Chronic pain and narcotic use.   - continue Supportive care as needed for acute discomfort, avoid overuse, as needed Narcan added.  Patient counseled , please see above discussion  HIV - Continue home regimen follows with [facility] ID department.  Anxiety and depression.  Home medications continued.  Currently not homicidal or suicidal.  GERD.  On PPI.  BPH.  On Flomax.  AKI.  Due to ATN from sepsis, resolved after hydration, gentle hydration again on 03/24/2021.  Hypertension.  Blood pressure soft, blood pressure medicine has been discontinued.  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  On sliding scale monitor and adjust  Lab Results  Component Value Date   HGBA1C  8.3 (H) 03/14/2021   CBG (last 3)  Recent Labs    03/30/21 2102 03/31/21 0539 03/31/21 0751  GLUCAP 175* 135* 86          Condition - Extremely Guarded  Family Communication  : None present bedside  Code Status :  Full  Consults  :  IR, cardiothoracic surgery, ID  PUD Prophylaxis : PPI   Procedures  :     Right chest wall I&D by Dr. Kipp Brood 5/36/64 -Repeat application of wound VAC and incision and drainage on 4/6  US guided chest wall abscess drined by IR 03/14/21   CT -  Increasing size of the right chest wall collections, primarily centered around the right sixth and eighth anterolateral ribs, with development of air bubbles. Those ribs show a pattern of irregular destruction that have progressed and look more like pathologic fractures due to infection rather than traumatic fractures. Air bubbles are now present within the collections and they probably represent abscesses. There is mild extrapleural involvement on the inner side of the ribcage, without frank breakthrough into the pleural space. The majority of the collections are on the outer side of the ribcage. No new areas of involvement are seen.  CT repeat 03/17/21 - 1. Interval decrease in size of bilobed right anterolateral chest wall fluid collection. The inferior lobulation is nearly completely resolved with some fat stranding and subcutaneous emphysema remaining. The more superior lobulation has decreased in size since prior study from 03/13/2021. The overall size of the collections now measures 7.4 x 7.1 x 3.7 cm compared to 10.1 x 9.1 x 5.7 cm on 03/13/2021. 2. Lucency in the anterior segments of the right sixth and seventh ribs again seen. Findings are suspicious for osteomyelitis, however underlying pathologic fracture is also possible.      Disposition Plan  :    Status is: Inpatient  Remains inpatient appropriate because:IV treatments  appropriate due to intensity of illness or inability to take  PO   Dispo: The patient is from: Home              Anticipated d/c is to: Home              Patient currently is not medically stable to d/c.   Difficult to place patient No  DVT Prophylaxis  :   Heparin   Lab Results  Component Value Date   PLT 271 03/31/2021    Diet :  Diet Order            Diet Carb Modified Fluid consistency: Thin; Room service appropriate? Yes  Diet effective now                  Inpatient Medications  Scheduled Meds: . abacavir-dolutegravir-lamiVUDine  1 tablet Oral Daily  . busPIRone  30 mg Oral BID  . clonazepam  0.5 mg Oral QID  . ezetimibe  10 mg Oral Daily  . feeding supplement  237 mL Oral BID BM  . feeding supplement (GLUCERNA SHAKE)  237 mL Oral Q24H  . fentaNYL  1 patch Transdermal Q72H  . Glecaprevir-Pibrentasvir  3 tablet Oral Daily  . heparin injection (subcutaneous)  5,000 Units Subcutaneous Q8H  . insulin aspart  0-15 Units Subcutaneous TID AC & HS  . [START ON 04/10/2021] linezolid  600 mg Oral Q12H  . metoprolol tartrate  25 mg Oral BID  . multivitamin with minerals  1 tablet Oral Daily  . pantoprazole  40 mg Oral QAC breakfast  . tamsulosin  0.4 mg Oral QHS  . tiZANidine  2 mg Oral QHS   Continuous Infusions: . sodium chloride 10 mL/hr at 03/31/21 0721  . DAPTOmycin (CUBICIN)  IV Stopped (03/30/21 2040)  . lactated ringers 10 mL/hr at 03/25/21 1007   PRN Meds:.sodium chloride, acetaminophen **OR** [DISCONTINUED] acetaminophen, antiseptic oral rinse, docusate sodium, HYDROmorphone (DILAUDID) injection, naLOXone (NARCAN)  injection, [DISCONTINUED] ondansetron **OR** ondansetron (ZOFRAN) IV, oxyCODONE-acetaminophen **AND** oxyCODONE, polyethylene glycol, valACYclovir  Antibiotics  :    Anti-infectives (From admission, onward)   Start     Dose/Rate Route Frequency Ordered Stop   04/10/21 1000  linezolid (ZYVOX) tablet 600 mg        600 mg Oral Every 12 hours 03/27/21 1410 05/01/21 0959   03/27/21 0000  linezolid (ZYVOX) 600  MG tablet       Note to Pharmacy: Patient staying through next week for IV abx - Just working ahead.   600 mg Oral 2 times daily 03/27/21 0933 04/17/21 2359   03/23/21 2000  DAPTOmycin (CUBICIN) 700 mg in sodium chloride 0.9 % IVPB        8 mg/kg  85.2 kg 128 mL/hr over 30 Minutes Intravenous Every 24 hours 03/23/21 1133 04/09/21 2359   03/22/21 1200  vancomycin (VANCOREADY) IVPB 1250 mg/250 mL  Status:  Discontinued        1,250 mg 166.7 mL/hr over 90 Minutes Intravenous Every 24 hours 03/21/21 1319 03/23/21 1133   03/20/21 1000  Glecaprevir-Pibrentasivir 100-40 mg (Mavyret) tabs 3 tablets - patient's own supply        3 tablet Oral Daily 03/20/21 0733     03/15/21 1200  vancomycin (VANCOREADY) IVPB 1500 mg/300 mL  Status:  Discontinued        1,500 mg 150 mL/hr over 120 Minutes Intravenous Every 24 hours 03/15/21 0732 03/21/21 1319   03/14/21 1200  vancomycin (VANCOREADY) IVPB 1250 mg/250 mL  Status:  Discontinued        1,250 mg 166.7 mL/hr over 90 Minutes Intravenous Every 24 hours 03/13/21 1359 03/15/21 0732   03/14/21 1023  valACYclovir (VALTREX) tablet 1,000 mg       Note to Pharmacy: Trig neuralgia flare     1,000 mg Oral 3 times daily PRN 03/14/21 1023     03/14/21 1000  abacavir-dolutegravir-lamiVUDine (TRIUMEQ) 600-50-300 MG per tablet 1 tablet        1 tablet Oral Daily 03/13/21 2320     03/14/21 0200  meropenem (MERREM) 1 g in sodium chloride 0.9 % 100 mL IVPB  Status:  Discontinued        1 g 200 mL/hr over 30 Minutes Intravenous Every 8 hours 03/13/21 2337 03/16/21 0719   03/14/21 0100  ceFEPIme (MAXIPIME) 2 g in sodium chloride 0.9 % 100 mL IVPB  Status:  Discontinued       Note to Pharmacy: Cefepime 2 g IV q12h for CrCl < 60 mL/min   2 g 200 mL/hr over 30 Minutes Intravenous Every 12 hours 03/13/21 2326 03/13/21 2331   03/13/21 2000  piperacillin-tazobactam (ZOSYN) IVPB 3.375 g  Status:  Discontinued        3.375 g 12.5 mL/hr over 240 Minutes Intravenous Every 8 hours  03/13/21 1402 03/13/21 2320   03/13/21 1430  clindamycin (CLEOCIN) IVPB 900 mg        900 mg 100 mL/hr over 30 Minutes Intravenous  Once 03/13/21 1418 03/13/21 1548   03/13/21 1230  vancomycin (VANCOCIN) IVPB 1000 mg/200 mL premix        1,000 mg 200 mL/hr over 60 Minutes Intravenous  Once 03/13/21 1229 03/13/21 1506   03/13/21 1230  piperacillin-tazobactam (ZOSYN) IVPB 3.375 g        3.375 g 100 mL/hr over 30 Minutes Intravenous  Once 03/13/21 1229 03/13/21 1400       Dauntae Derusha M.D on 03/31/2021 at 11:51 AM  To page go to [email or web address]   [facility] -  Office  [phone]    See all Orders from today for further details    Objective:   Vitals:   03/30/21 0452 03/30/21 1140 03/30/21 2103 03/31/21 0541  BP: 104/62 115/72 116/65 115/64  Pulse: 66 75 73 66  Resp: 18 17 19 18   Temp: 97.7 F (36.5 C) 97.7 F (36.5 C) 97.8 F (36.6 C) 98.1 F (36.7 C)  TempSrc: Oral Oral Axillary Axillary  SpO2: 100% 99% 98%   Weight:      Height:        Wt Readings from Last 3 Encounters:  03/25/21 85.2 kg  03/09/21 77.1 kg  01/22/21 72.6 kg     Intake/Output Summary (Last 24 hours) at 03/31/2021 1151 Last data filed at 03/31/2021 0953 Gross per 24 hour  Intake 304.11 ml  Output 1275 ml  Net -970.89 ml   Physical Exam  Awake Alert, Oriented X 3, No new F.N deficits, Normal affect Symmetrical Chest wall movement, Good air movement bilaterally, wound VAC to right lower chest RRR,No Gallops,Rubs or new Murmurs, No Parasternal Heave +ve B.Sounds, Abd Soft, No tenderness, No rebound - guarding or rigidity. No Cyanosis, Clubbing or edema, No new Rash or bruise       Data Review:    CBC Recent Labs  Lab 03/25/21 0032 03/26/21 0329 03/31/21 0633  WBC 6.5 5.4 5.2  HGB 10.2* 9.5* 9.8*  HCT 31.4* 29.4* 30.1*  PLT 364 332 271  MCV 99.4 99.3  98.4  MCH 32.3 32.1 32.0  MCHC 32.5 32.3 32.6  RDW 13.3 13.4 14.1  LYMPHSABS 1.5 1.4  --   MONOABS 0.7 0.6  --    EOSABS 0.2 0.2  --   BASOSABS 0.0 0.0  --     Recent Labs  Lab 03/25/21 0032 03/26/21 0329 03/31/21 0633  NA 137 137 136  K 4.5 4.7 4.8  CL 97* 99 97*  CO2 33* 32 29  GLUCOSE 173* 207* 128*  BUN 29* 23 33*  CREATININE 1.36* 1.28* 1.38*  CALCIUM 8.9 8.7* 9.2  AST 13* 16  --   ALT 10 10  --   ALKPHOS 82 81  --   BILITOT 0.5 0.6  --   ALBUMIN 2.4* 2.3*  --   MG 1.9 1.9  --   CRP 2.5* 2.5*  --   BNP 27.3 40.5  --     ------------------------------------------------------------------------------------------------------------------ No results for input(s): CHOL, HDL, LDLCALC, TRIG, CHOLHDL, LDLDIRECT in the last 72 hours.  Lab Results  Component Value Date   HGBA1C 8.3 (H) 03/14/2021   ------------------------------------------------------------------------------------------------------------------ No results for input(s): TSH, T4TOTAL, T3FREE, THYROIDAB in the last 72 hours.  Invalid input(s): FREET3  Cardiac Enzymes No results for input(s): CKMB, TROPONINI, MYOGLOBIN in the last 168 hours.  Invalid input(s): CK ------------------------------------------------------------------------------------------------------------------    Component Value Date/Time   BNP 40.5 03/26/2021 0329    Micro Results No results found for this or any previous visit (from the past 240 hour(s)).  Radiology Reports CT CHEST W CONTRAST  Result Date: 03/22/2021 CLINICAL DATA:  70 year old male with chest pain and shortness of breath. EXAM: CT CHEST WITH CONTRAST TECHNIQUE: Multidetector CT imaging of the chest was performed during intravenous contrast administration. CONTRAST:  33mL OMNIPAQUE IOHEXOL 300 MG/ML  SOLN COMPARISON:  Chest CT dated 03/17/2021. FINDINGS: Cardiovascular: There is no cardiomegaly or pericardial effusion. There is 3 vessel coronary vascular calcification. There is mild atherosclerotic calcification of the thoracic aorta. No aneurysmal dilatation or dissection. The  origins of the great vessels of the aortic arch appear patent as visualized. No pulmonary artery embolus identified. Mediastinum/Nodes: No hilar or mediastinal adenopathy. The esophagus is grossly unremarkable. No mediastinal fluid collection. Lungs/Pleura: Small bilateral pleural effusions with partial compressive atelectasis of the lower lobes. Pneumonia is not excluded. Clinical correlation is recommended. No lobar consolidation or pneumothorax. The central airways are patent. Upper Abdomen: No acute abnormality. Musculoskeletal: Degenerative changes of the spine. There is an area of inflammatory changes with small pockets of air in the right anterior chest wall along the anterior right sixth rib. There is fragmentation of the anterior right sixth rib concerning for osteomyelitis. Clinical correlation is recommended. No drainable fluid collection or abscess identified. There is thickening of the adjacent pleura. There is a large skin wound in the lower right anterior chest wall with a wound VAC. IMPRESSION: 1. No CT evidence of pulmonary embolism. 2. Small bilateral pleural effusions with partial compressive atelectasis of the lower lobes. Pneumonia is not excluded. Clinical correlation is recommended. 3. Large skin wound in the lower right anterior chest wall with a wound VAC. 4. Destructive changes of the anterior right sixth rib concerning for osteomyelitis. Clinical correlation is recommended. No drainable fluid collection or abscess. 5. Aortic Atherosclerosis (ICD10-I70.0). Electronically Signed   By: Anner Crete M.D.   On: 03/22/2021 19:48   CT CHEST W CONTRAST  Result Date: 03/17/2021 CLINICAL DATA:  Infected right chest wall hematoma EXAM: CT CHEST WITH CONTRAST TECHNIQUE: Multidetector  CT imaging of the chest was performed during intravenous contrast administration. CONTRAST:  75 mL OMNIPAQUE IOHEXOL 350 MG/ML SOLN COMPARISON:  03/13/2021 FINDINGS: Cardiovascular: Heart size within normal  limits. Coronary artery calcifications seen throughout. No significant vascular abnormality identified. Mediastinum/Nodes: No enlarged mediastinal, hilar, or axillary lymph nodes. Lungs/Pleura: Trace bilateral pleural effusions with adjacent atelectasis. Upper Abdomen: No acute abnormality. Musculoskeletal: Previously seen right chest wall collection has decreased in size since prior examination, measuring approximately 7.4 x 7.1 x 3.7 cm compared to 10.1 x 9.1 x 5.7 cm on prior exam from 03/13/2021. The collection is somewhat bilobed. The inferior portion of the right chest wall collection has decreased to a greater degree than the superior lobulation. Greater amount of air within the collection likely due to presence of drains. Lucency and irregularity of the right sixth and seventh ribs, anterior segment again noted. IMPRESSION: 1. Interval decrease in size of bilobed right anterolateral chest wall fluid collection. The inferior lobulation is nearly completely resolved with some fat stranding and subcutaneous emphysema remaining. The more superior lobulation has decreased in size since prior study from 03/13/2021. The overall size of the collections now measures 7.4 x 7.1 x 3.7 cm compared to 10.1 x 9.1 x 5.7 cm on 03/13/2021. 2. Lucency in the anterior segments of the right sixth and seventh ribs again seen. Findings are suspicious for osteomyelitis, however underlying pathologic fracture is also possible. Electronically Signed   By: Miachel Roux M.D.   On: 03/17/2021 14:05   CT Chest W Contrast  Result Date: 03/13/2021 CLINICAL DATA:  Follow-up right chest wall hematoma and fractures of the right sixth through eighth ribs. Diabetes. HIV. EXAM: CT CHEST WITH CONTRAST TECHNIQUE: Multidetector CT imaging of the chest was performed during intravenous contrast administration. CONTRAST:  147mL OMNIPAQUE IOHEXOL 300 MG/ML  SOLN COMPARISON:  03/09/2021 FINDINGS: Cardiovascular: Heart size remains normal. No  pericardial fluid. Coronary artery calcification as seen previously. Aortic atherosclerotic calcification as seen previously. Mediastinum/Nodes: Normal Lungs/Pleura: Scarring/atelectasis at the lung bases left more than right unchanged since the previous study. Small areas of patchy density in the superior segment of the right lower lobe unchanged from the study of 4 days ago. No new or progressive pulmonary finding. No pneumothorax. Upper Abdomen: No upper abdominal injury or acute finding. Musculoskeletal: There is increasing size of the right chest wall collections, primarily centered around the right sixth and eighth anterolateral ribs. Those ribs show a pattern of irregular destruction that look more like pathologic fractures due to infection rather than traumatic fractures. Air bubbles are now present within the collections. There is mild extrapleural involvement in side of the ribcage, without frank breakthrough into the pleural space. Most of the collections manifest external to the ribcage. No new areas of involvement are seen. IMPRESSION: Increasing size of the right chest wall collections, primarily centered around the right sixth and eighth anterolateral ribs, with development of air bubbles. Those ribs show a pattern of irregular destruction that have progressed and look more like pathologic fractures due to infection rather than traumatic fractures. Air bubbles are now present within the collections and they probably represent abscesses. There is mild extrapleural involvement on the inner side of the ribcage, without frank breakthrough into the pleural space. The majority of the collections are on the outer side of the ribcage. No new areas of involvement are seen. Case discussed with Dr. Regenia Skeeter at 1419 hours. Aortic Atherosclerosis (ICD10-I70.0). Electronically Signed   By: Nelson Chimes M.D.   On: 03/13/2021  14:19   CT Chest W Contrast  Result Date: 03/09/2021 CLINICAL DATA:  Fall 3 weeks prior  with right rib fractures and persistent chest pain. EXAM: CT CHEST WITH CONTRAST TECHNIQUE: Multidetector CT imaging of the chest was performed during intravenous contrast administration. CONTRAST:  35mL OMNIPAQUE IOHEXOL 300 MG/ML  SOLN COMPARISON:  Chest radiograph from earlier today. 11/22/2019 chest CT angiogram. FINDINGS: Cardiovascular: Normal heart size. No significant pericardial effusion/thickening. Three-vessel coronary atherosclerosis. Atherosclerotic nonaneurysmal thoracic aorta. Top-normal caliber main pulmonary artery (3.0 cm diameter). No central pulmonary emboli. Mediastinum/Nodes: No discrete thyroid nodules. Unremarkable esophagus. No pathologically enlarged axillary, mediastinal or hilar lymph nodes. Lungs/Pleura: No pneumothorax. No pleural effusion. No acute consolidative airspace disease, lung masses or significant pulmonary nodules. Mild platelike atelectasis in the anterior basilar left lower lobe. Mild patchy ground-glass opacity in peripheral posterior right mid lung. Upper abdomen: No acute abnormality. Musculoskeletal: No aggressive appearing focal osseous lesions. Nondisplaced acute anterior right sixth, seventh and eighth rib fractures with surrounding chest wall hematoma measuring up to 8.1 x 4.9 cm in maximum axial dimensions at the level of the anterior right sixth rib fracture (series 2/image 114). Moderate thoracic spondylosis. IMPRESSION: 1. Nondisplaced acute anterior right sixth, seventh and eighth rib fractures with surrounding chest wall hematoma. No pneumothorax or hemothorax. No discrete bone lesions. Clinical follow-up advised to ensure resolution of the right chest wall hematoma. Any need for follow-up imaging should be based on clinical assessment. 2. Mild patchy ground-glass opacity in the peripheral posterior right mid lung, nonspecific, favor mild pulmonary contusion. 3. Mild platelike atelectasis at the left lung base. 4. Three-vessel coronary atherosclerosis. 5.  Aortic Atherosclerosis (ICD10-I70.0). Electronically Signed   By: Ilona Sorrel M.D.   On: 03/09/2021 13:03   IR US Guide Bx Asp/Drain  Result Date: 03/14/2021 INDICATION: 70 year old with history of fall and right rib fractures. Patient developed right chest hematomas. Patient is complaining of pain and recent CT demonstrates gas within the hematomas. Findings are concerning for infected hematomas. EXAM: PLACEMENT OF SUPERFICIAL RIGHT ANTERIOR CHEST WALL DRAIN USING ULTRASOUND GUIDANCE x 2 MEDICATIONS: Moderate sedation ANESTHESIA/SEDATION: Fentanyl 100 mcg IV; Versed 2.0 mg IV Moderate Sedation Time:  29 minutes The patient was continuously monitored during the procedure by the interventional radiology nurse under my direct supervision. COMPLICATIONS: None immediate. PROCEDURE: Informed written consent was obtained from the patient after a thorough discussion of the procedural risks, benefits and alternatives. All questions were addressed. Maximal Sterile Barrier Technique was utilized including caps, mask, sterile gowns, sterile gloves, sterile drape, hand hygiene and skin antiseptic. A timeout was performed prior to the initiation of the procedure. The right anterior chest wall hematomas were evaluated with ultrasound. Complex collections containing a small amount of fluid were obtained. The right anterior chest was prepped with chlorhexidine and sterile field was created. Attention was initially directed to the more inferior or caudal anterior chest wall collection. Skin was anesthetized with 1% lidocaine. Using ultrasound guidance, an 18 gauge trocar needle was directed into the collection and pink purulent fluid was aspirated. Superstiff Amplatz wire was advanced into the complex collection and the tract was dilated to accommodate a 10 [place] drain. Catheter was sutured to the skin and attached to a suction bulb. A sample of fluid was sent for culture. A second area more cephalad was targeted with  ultrasound guidance. Skin was anesthetized with 1% lidocaine. Using ultrasound guidance, an 18 gauge trocar needle was directed into the complex collection. Again, purulent fluid was aspirated. Superstiff Amplatz wire  was advanced into this collection and a 10 [place] drain was placed. Additional purulent fluid was aspirated and the catheter was sutured to skin and attached to a suction bulb. Dressings were placed over both drains. FINDINGS: Patient has palpable hematomas in the right anterior chest and upper abdomen region. Ultrasound demonstrates heterogeneous collections in both areas containing a small amount of compressible fluid. There are echogenic areas within the collections compatible with known gas. 10 French drains were placed in both areas and purulent fluid was draining from both collections. Both collections are very complex and compatible with infected hematomas. IMPRESSION: Ultrasound-guided placement of 2 percutaneous drains within the superficial right anterior chest wall collections. Findings are compatible with infected hematomas. Fluid sample was sent for culture. Electronically Signed   By: Markus Daft M.D.   On: 03/14/2021 18:37   IR US Guide Bx Asp/Drain  Result Date: 03/14/2021 INDICATION: 70 year old with history of fall and right rib fractures. Patient developed right chest hematomas. Patient is complaining of pain and recent CT demonstrates gas within the hematomas. Findings are concerning for infected hematomas. EXAM: PLACEMENT OF SUPERFICIAL RIGHT ANTERIOR CHEST WALL DRAIN USING ULTRASOUND GUIDANCE x 2 MEDICATIONS: Moderate sedation ANESTHESIA/SEDATION: Fentanyl 100 mcg IV; Versed 2.0 mg IV Moderate Sedation Time:  29 minutes The patient was continuously monitored during the procedure by the interventional radiology nurse under my direct supervision. COMPLICATIONS: None immediate. PROCEDURE: Informed written consent was obtained from the patient after a thorough discussion of the  procedural risks, benefits and alternatives. All questions were addressed. Maximal Sterile Barrier Technique was utilized including caps, mask, sterile gowns, sterile gloves, sterile drape, hand hygiene and skin antiseptic. A timeout was performed prior to the initiation of the procedure. The right anterior chest wall hematomas were evaluated with ultrasound. Complex collections containing a small amount of fluid were obtained. The right anterior chest was prepped with chlorhexidine and sterile field was created. Attention was initially directed to the more inferior or caudal anterior chest wall collection. Skin was anesthetized with 1% lidocaine. Using ultrasound guidance, an 18 gauge trocar needle was directed into the collection and pink purulent fluid was aspirated. Superstiff Amplatz wire was advanced into the complex collection and the tract was dilated to accommodate a 10 [place] drain. Catheter was sutured to the skin and attached to a suction bulb. A sample of fluid was sent for culture. A second area more cephalad was targeted with ultrasound guidance. Skin was anesthetized with 1% lidocaine. Using ultrasound guidance, an 18 gauge trocar needle was directed into the complex collection. Again, purulent fluid was aspirated. Superstiff Amplatz wire was advanced into this collection and a 10 French drain was placed. Additional purulent fluid was aspirated and the catheter was sutured to skin and attached to a suction bulb. Dressings were placed over both drains. FINDINGS: Patient has palpable hematomas in the right anterior chest and upper abdomen region. Ultrasound demonstrates heterogeneous collections in both areas containing a small amount of compressible fluid. There are echogenic areas within the collections compatible with known gas. 10 French drains were placed in both areas and purulent fluid was draining from both collections. Both collections are very complex and compatible with infected hematomas.  IMPRESSION: Ultrasound-guided placement of 2 percutaneous drains within the superficial right anterior chest wall collections. Findings are compatible with infected hematomas. Fluid sample was sent for culture. Electronically Signed   By: Markus Daft M.D.   On: 03/14/2021 18:37   DG Chest Port 1 View  Result Date: 03/13/2021 CLINICAL  DATA:  Sepsis, right rib pain. EXAM: PORTABLE CHEST 1 VIEW COMPARISON:  March 09, 2021. FINDINGS: The heart size and mediastinal contours are within normal limits. Both lungs are clear. No pneumothorax or pleural effusion is noted. The visualized skeletal structures are unremarkable. IMPRESSION: No active disease. Electronically Signed   By: Marijo Conception M.D.   On: 03/13/2021 14:11   DG Chest Portable 1 View  Result Date: 03/09/2021 CLINICAL DATA:  Chest pain. Chest [place] hurting late yesterday. Complains of right-sided sternal pain and chest pain. EXAM: PORTABLE CHEST 1 VIEW COMPARISON:  None. FINDINGS: The heart size and mediastinal contours are within normal limits. Both lungs are clear. Blunting of the left costophrenic angle is noted, new from prior studies. The visualized skeletal structures are unremarkable. IMPRESSION: New blunting of left costophrenic angle may reflect small effusion. The lungs are otherwise clear. No signs of interstitial edema. Electronically Signed   By: Kerby Moors M.D.   On: 03/09/2021 11:26   [place] IMAGE GUIDED DRAINAGE BY PERCUTANEOUS CATHETER  Result Date: 03/18/2021 INDICATION: History of right-sided rib fractures complicated by development of infected adjacent hematomas, post ultrasound-guided placement of two percutaneous drainage catheters on 03/14/2021. Unfortunately, one of the percutaneous drainage catheters was inadvertently removed with subsequent chest CT performed 03/17/2021 demonstrating a persistent undrained complex fluid collection involving the anterior aspect of the right chest wall. As such, request made for  ultrasound-guided aspiration and/or drainage catheter placement for infection source control purposes. EXAM: 1. ULTRASOUND-GUIDED RIGHT CHEST WALL PERCUTANEOUS DRAINAGE CATHETER PLACEMENT X2 2. ULTRASOUND-GUIDED RIGHT CHEST WALL ABSCESS ASPIRATION COMPARISON:  Chest CT-03/17/2021; 03/13/2021; ultrasound-guided right chest wall percutaneous drainage catheter placement x2-03/14/2021 MEDICATIONS: The patient is currently admitted to the hospital and receiving intravenous antibiotics. The antibiotics were administered within an appropriate time frame prior to the initiation of the procedure. ANESTHESIA/SEDATION: Moderate (conscious) sedation was employed during this procedure. A total of Versed 4 mg and Fentanyl 200 mcg was administered intravenously. Moderate Sedation Time: 41 minutes. The patient's level of consciousness and vital signs were monitored continuously by radiology nursing throughout the procedure under my direct supervision. CONTRAST:  None COMPLICATIONS: None immediate. PROCEDURE: Informed written consent was obtained from the patient after a discussion of the risks, benefits and alternatives to treatment. Preprocedural ultrasound scanning demonstrated complex fluid involving the anterior aspect of the right chest wall compatible with the findings seen on preceding chest CT. A timeout was performed prior to the initiation of the procedure. The skin overlying the right anterior chest was prepped and draped in the usual sterile fashion. The overlying soft tissues were anesthetized with 1% lidocaine with epinephrine. Under direct ultrasound guidance, a 18 gauge trocar needle was advanced into the complex abscess within the right chest wall, inferior to the nipple. A short Amplatz wire was coiled within the collection. The track was dilated ultimately allowing placement of a 10 French percutaneous catheter. Multiple ultrasound images were saved procedural documentation purposes. Next, approximately 10 cc of  purulent fluid was aspirated from the drainage catheter Sonographic evaluation demonstrates a residual complex fluid collection and as such the more medial component of the collection was accessed with an 18 gauge trocar needle. A short Amplatz wire was coiled within the collection and an additional 10 French percutaneous drainage catheter was placed under ultrasound guidance. Multiple ultrasound images were saved procedural documentation purposes. Next, approximately 30 cc of purulent fluid was aspirated from this more medially positioned chest wall drain. A representative sample was capped and sent to the  laboratory for analysis. Next, the lateral component of the right chest wall collection was accessed with an 18 gauge trocar needle however only a small amount of bloody fluid was able to be aspirated. As such, a short Amplatz wire was coiled within the collection and the trocar needle was exchanged for a Yueh sheath catheter which was utilized to aspirate approximately 3 cc of thick bloody fluid. At this time, approximately 70 cc of additional blood-tinged purulent fluid was able to be expressed from the site of the previously removed percutaneous drainage catheter. Drainage catheters were secured at the entrance site within interrupted sutures and drainage catheters were connected to JP bulbs. Dressings were applied. The patient tolerated the procedure well without immediate postprocedural complication. IMPRESSION: 1. Successful ultrasound-guided placement of 2 two additional right anterior chest wall percutaneous drainage catheters yielding a total of 40 cc of purulent fluid. A representative aspirated sample was sent to the laboratory for analysis. 2. Successful ultrasound-guided aspiration of approximately 3 cc of thick bloody fluid from the more lateral component of the right anterior chest wall complex hematoma. 3. Successful bedside expression of approximately 70 cc of purulent fluid from the entrance  site of the recently removed percutaneous drainage catheter. Electronically Signed   By: Sandi Mariscal M.D.   On: 03/18/2021 15:29   [place] IMAGE GUIDED DRAINAGE BY PERCUTANEOUS CATHETER  Result Date: 03/18/2021 INDICATION: History of right-sided rib fractures complicated by development of infected adjacent hematomas, post ultrasound-guided placement of two percutaneous drainage catheters on 03/14/2021. Unfortunately, one of the percutaneous drainage catheters was inadvertently removed with subsequent chest CT performed 03/17/2021 demonstrating a persistent undrained complex fluid collection involving the anterior aspect of the right chest wall. As such, request made for ultrasound-guided aspiration and/or drainage catheter placement for infection source control purposes. EXAM: 1. ULTRASOUND-GUIDED RIGHT CHEST WALL PERCUTANEOUS DRAINAGE CATHETER PLACEMENT X2 2. ULTRASOUND-GUIDED RIGHT CHEST WALL ABSCESS ASPIRATION COMPARISON:  Chest CT-03/17/2021; 03/13/2021; ultrasound-guided right chest wall percutaneous drainage catheter placement x2-03/14/2021 MEDICATIONS: The patient is currently admitted to the hospital and receiving intravenous antibiotics. The antibiotics were administered within an appropriate time frame prior to the initiation of the procedure. ANESTHESIA/SEDATION: Moderate (conscious) sedation was employed during this procedure. A total of Versed 4 mg and Fentanyl 200 mcg was administered intravenously. Moderate Sedation Time: 41 minutes. The patient's level of consciousness and vital signs were monitored continuously by radiology nursing throughout the procedure under my direct supervision. CONTRAST:  None COMPLICATIONS: None immediate. PROCEDURE: Informed written consent was obtained from the patient after a discussion of the risks, benefits and alternatives to treatment. Preprocedural ultrasound scanning demonstrated complex fluid involving the anterior aspect of the right chest wall compatible with the  findings seen on preceding chest CT. A timeout was performed prior to the initiation of the procedure. The skin overlying the right anterior chest was prepped and draped in the usual sterile fashion. The overlying soft tissues were anesthetized with 1% lidocaine with epinephrine. Under direct ultrasound guidance, a 18 gauge trocar needle was advanced into the complex abscess within the right chest wall, inferior to the nipple. A short Amplatz wire was coiled within the collection. The track was dilated ultimately allowing placement of a 10 French percutaneous catheter. Multiple ultrasound images were saved procedural documentation purposes. Next, approximately 10 cc of purulent fluid was aspirated from the drainage catheter Sonographic evaluation demonstrates a residual complex fluid collection and as such the more medial component of the collection was accessed with an 18 gauge trocar needle. A  short Amplatz wire was coiled within the collection and an additional 10 French percutaneous drainage catheter was placed under ultrasound guidance. Multiple ultrasound images were saved procedural documentation purposes. Next, approximately 30 cc of purulent fluid was aspirated from this more medially positioned chest wall drain. A representative sample was capped and sent to the laboratory for analysis. Next, the lateral component of the right chest wall collection was accessed with an 18 gauge trocar needle however only a small amount of bloody fluid was able to be aspirated. As such, a short Amplatz wire was coiled within the collection and the trocar needle was exchanged for a Yueh sheath catheter which was utilized to aspirate approximately 3 cc of thick bloody fluid. At this time, approximately 70 cc of additional blood-tinged purulent fluid was able to be expressed from the site of the previously removed percutaneous drainage catheter. Drainage catheters were secured at the entrance site within interrupted sutures  and drainage catheters were connected to JP bulbs. Dressings were applied. The patient tolerated the procedure well without immediate postprocedural complication. IMPRESSION: 1. Successful ultrasound-guided placement of 2 two additional right anterior chest wall percutaneous drainage catheters yielding a total of 40 cc of purulent fluid. A representative aspirated sample was sent to the laboratory for analysis. 2. Successful ultrasound-guided aspiration of approximately 3 cc of thick bloody fluid from the more lateral component of the right anterior chest wall complex hematoma. 3. Successful bedside expression of approximately 70 cc of purulent fluid from the entrance site of the recently removed percutaneous drainage catheter. Electronically Signed   By: Sandi Mariscal M.D.   On: 03/18/2021 15:29   [place] FINE NEEDLE ASP 1ST LESION  Result Date: 03/18/2021 INDICATION: History of right-sided rib fractures complicated by development of infected adjacent hematomas, post ultrasound-guided placement of two percutaneous drainage catheters on 03/14/2021. Unfortunately, one of the percutaneous drainage catheters was inadvertently removed with subsequent chest CT performed 03/17/2021 demonstrating a persistent undrained complex fluid collection involving the anterior aspect of the right chest wall. As such, request made for ultrasound-guided aspiration and/or drainage catheter placement for infection source control purposes. EXAM: 1. ULTRASOUND-GUIDED RIGHT CHEST WALL PERCUTANEOUS DRAINAGE CATHETER PLACEMENT X2 2. ULTRASOUND-GUIDED RIGHT CHEST WALL ABSCESS ASPIRATION COMPARISON:  Chest CT-03/17/2021; 03/13/2021; ultrasound-guided right chest wall percutaneous drainage catheter placement x2-03/14/2021 MEDICATIONS: The patient is currently admitted to the hospital and receiving intravenous antibiotics. The antibiotics were administered within an appropriate time frame prior to the initiation of the procedure.  ANESTHESIA/SEDATION: Moderate (conscious) sedation was employed during this procedure. A total of Versed 4 mg and Fentanyl 200 mcg was administered intravenously. Moderate Sedation Time: 41 minutes. The patient's level of consciousness and vital signs were monitored continuously by radiology nursing throughout the procedure under my direct supervision. CONTRAST:  None COMPLICATIONS: None immediate. PROCEDURE: Informed written consent was obtained from the patient after a discussion of the risks, benefits and alternatives to treatment. Preprocedural ultrasound scanning demonstrated complex fluid involving the anterior aspect of the right chest wall compatible with the findings seen on preceding chest CT. A timeout was performed prior to the initiation of the procedure. The skin overlying the right anterior chest was prepped and draped in the usual sterile fashion. The overlying soft tissues were anesthetized with 1% lidocaine with epinephrine. Under direct ultrasound guidance, a 18 gauge trocar needle was advanced into the complex abscess within the right chest wall, inferior to the nipple. A short Amplatz wire was coiled within the collection. The track was dilated ultimately allowing placement  of a 10 French percutaneous catheter. Multiple ultrasound images were saved procedural documentation purposes. Next, approximately 10 cc of purulent fluid was aspirated from the drainage catheter Sonographic evaluation demonstrates a residual complex fluid collection and as such the more medial component of the collection was accessed with an 18 gauge trocar needle. A short Amplatz wire was coiled within the collection and an additional 10 French percutaneous drainage catheter was placed under ultrasound guidance. Multiple ultrasound images were saved procedural documentation purposes. Next, approximately 30 cc of purulent fluid was aspirated from this more medially positioned chest wall drain. A representative sample was  capped and sent to the laboratory for analysis. Next, the lateral component of the right chest wall collection was accessed with an 18 gauge trocar needle however only a small amount of bloody fluid was able to be aspirated. As such, a short Amplatz wire was coiled within the collection and the trocar needle was exchanged for a Yueh sheath catheter which was utilized to aspirate approximately 3 cc of thick bloody fluid. At this time, approximately 70 cc of additional blood-tinged purulent fluid was able to be expressed from the site of the previously removed percutaneous drainage catheter. Drainage catheters were secured at the entrance site within interrupted sutures and drainage catheters were connected to JP bulbs. Dressings were applied. The patient tolerated the procedure well without immediate postprocedural complication. IMPRESSION: 1. Successful ultrasound-guided placement of 2 two additional right anterior chest wall percutaneous drainage catheters yielding a total of 40 cc of purulent fluid. A representative aspirated sample was sent to the laboratory for analysis. 2. Successful ultrasound-guided aspiration of approximately 3 cc of thick bloody fluid from the more lateral component of the right anterior chest wall complex hematoma. 3. Successful bedside expression of approximately 70 cc of purulent fluid from the entrance site of the recently removed percutaneous drainage catheter. Electronically Signed   By: Sandi Mariscal M.D.   On: 03/18/2021 15:29
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
## 2021-04-01 DIAGNOSIS — L02213 Cutaneous abscess of chest wall: Secondary | ICD-10-CM | POA: Diagnosis not present

## 2021-04-01 DIAGNOSIS — F419 Anxiety disorder, unspecified: Secondary | ICD-10-CM | POA: Diagnosis not present

## 2021-04-01 DIAGNOSIS — A419 Sepsis, unspecified organism: Secondary | ICD-10-CM | POA: Diagnosis not present

## 2021-04-01 DIAGNOSIS — N179 Acute kidney failure, unspecified: Secondary | ICD-10-CM | POA: Diagnosis not present

## 2021-04-01 LAB — GLUCOSE, CAPILLARY
Glucose-Capillary: 126 mg/dL — ABNORMAL HIGH (ref 70–99)
Glucose-Capillary: 103 mg/dL — ABNORMAL HIGH (ref 70–99)
Glucose-Capillary: 121 mg/dL — ABNORMAL HIGH (ref 70–99)
Glucose-Capillary: 175 mg/dL — ABNORMAL HIGH (ref 70–99)
Glucose-Capillary: 141 mg/dL — ABNORMAL HIGH (ref 70–99)

## 2021-04-01 NOTE — Progress Notes (Signed)
PROGRESS NOTE        PATIENT DETAILS Name: Brian Malone Age: 70 y.o. Sex: male Date of Birth: 07-21-1951 Admit Date: 03/13/2021 Admitting Physician Lequita Halt, MD BDZ:[id number] [name], MD  Brief Narrative: Patient is a 70 y.o. male with history of chronic pain syndrome, hepatitis C, HIV, HTN, HLD, DM-2-who had sustained a fall in his yard on 3/6-following which she was diagnosed with acute right sixth, seventh and eighth rib fractures-he was treated conservatively-he presented to the ED on 3/25 with sepsis-and was found to have right chest wall abscess along with osteomyelitis.  See below for further details.  Significant events: 3/25>> admit to [facility]-right chest wall abscess/osteomyelitis. 3/30>> ultrasound guided aspiration of chest wall hematoma/abscess 3/31>> chest wall debridement-placement of wound VAC by cardiothoracic surgery  Significant studies: 3/25>> CT chest : Increasing size of right chest wall collection-possible abscess. 3/29>> CT chest: Interval decrease in size of a by lobar right anterolateral chest wall fluid collection, lucency in the anterior aspect of the right sixth and seventh ribs-suspicious for osteomyelitis. 4/3>> CT chest: No PE, destructive changes in the right anterior sixth rib concerning for osteomyelitis.  Antimicrobial therapy: None  Microbiology data: 3/25>> blood culture: No growth 3/26>> right chest wall hematoma/abscess: MRSA 3/30>> right chest wall fluid: MRSA 3/31>> right chest wall abscess: MRSA  Procedures : 3/26>> ultrasound-guided placement of 2 percutaneous drains in the right chest wall fluid collection 3/30>> ultrasound guided aspiration of chest wall hematoma/abscess 3/31>> chest wall debridement-placement of wound VAC by cardiothoracic surgery  Consults: ID, cardiothoracic surgery  DVT Prophylaxis : heparin injection 5,000 Units Start: 03/19/21 0600 SCDs Start: 03/13/21  2321   Subjective: Lying comfortably in bed-pain adequately controlled with current regimen.  Assessment/Plan: Sepsis due to right-sided chest wall abscess-multiple rib fractures-osteomyelitis of the sixth rib-fluid cultures positive for MRSA: Sepsis physiology has resolved-s/p debridement-wound VAC in place-ID recommending 3 weeks of daptomycin from 3/31-and then to transition to oral Zyvox until seen at outpatient ID clinic.  Pain currently controlled with transdermal fentanyl 25 mcg-and as needed oral oxycodone.  With dressing changes he receives IV Dilaudid.  AKI on CKD stage II: Creatinine close to baseline-AKI was likely hemodynamically mediated.  HTN: BP controlled-continue metoprolol.  HLD: Continue Zetia  DM-2 (A1c 8.3 on 3/26): CBG stable-continue SSI  Recent Labs    04/01/21 0625 04/01/21 0819 04/01/21 1255  GLUCAP 126* 103* 121*    HIV: Continue antiretrovirals  Anxiety/depression: Stable-continue BuSpar/Klonopin.  GERD: Continue PPI  BPH: Continue Flomax  Nutrition Problem: Nutrition Problem: Severe Malnutrition Etiology: acute illness (rib fractures S/P surgeries and drain placement causing pain) Signs/Symptoms: severe muscle depletion,severe fat depletion Interventions: Ensure Enlive (each supplement provides 350kcal and 20 grams of protein),MVI,Prostat,Hormel Shake,Glucerna shake    Diet: Diet Order            Diet Carb Modified Fluid consistency: Thin; Room service appropriate? Yes  Diet effective now                  Code Status: Full code   Family Communication: None at bedside  Disposition Plan: Status is: Inpatient  Remains inpatient appropriate because:Inpatient level of care appropriate due to severity of illness   Dispo: The patient is from: Home              Anticipated d/c is to: Home  Patient currently is not medically stable to d/c.   Difficult to place patient No        Barriers to  Discharge:  Antimicrobial agents: Anti-infectives (From admission, onward)   Start     Dose/Rate Route Frequency Ordered Stop   04/10/21 1000  linezolid (ZYVOX) tablet 600 mg        600 mg Oral Every 12 hours 03/27/21 1410 05/01/21 0959   03/27/21 0000  linezolid (ZYVOX) 600 MG tablet       Note to Pharmacy: Patient staying through next week for IV abx - Just working ahead.   600 mg Oral 2 times daily 03/27/21 0933 04/17/21 2359   03/23/21 2000  DAPTOmycin (CUBICIN) 700 mg in sodium chloride 0.9 % IVPB        8 mg/kg  85.2 kg 128 mL/hr over 30 Minutes Intravenous Every 24 hours 03/23/21 1133 04/09/21 2359   03/22/21 1200  vancomycin (VANCOREADY) IVPB 1250 mg/250 mL  Status:  Discontinued        1,250 mg 166.7 mL/hr over 90 Minutes Intravenous Every 24 hours 03/21/21 1319 03/23/21 1133   03/20/21 1000  Glecaprevir-Pibrentasivir 100-40 mg (Mavyret) tabs 3 tablets - patient's own supply        3 tablet Oral Daily 03/20/21 0733     03/15/21 1200  vancomycin (VANCOREADY) IVPB 1500 mg/300 mL  Status:  Discontinued        1,500 mg 150 mL/hr over 120 Minutes Intravenous Every 24 hours 03/15/21 0732 03/21/21 1319   03/14/21 1200  vancomycin (VANCOREADY) IVPB 1250 mg/250 mL  Status:  Discontinued        1,250 mg 166.7 mL/hr over 90 Minutes Intravenous Every 24 hours 03/13/21 1359 03/15/21 0732   03/14/21 1023  valACYclovir (VALTREX) tablet 1,000 mg       Note to Pharmacy: Trig neuralgia flare     1,000 mg Oral 3 times daily PRN 03/14/21 1023     03/14/21 1000  abacavir-dolutegravir-lamiVUDine (TRIUMEQ) 600-50-300 MG per tablet 1 tablet        1 tablet Oral Daily 03/13/21 2320     03/14/21 0200  meropenem (MERREM) 1 g in sodium chloride 0.9 % 100 mL IVPB  Status:  Discontinued        1 g 200 mL/hr over 30 Minutes Intravenous Every 8 hours 03/13/21 2337 03/16/21 0719   03/14/21 0100  ceFEPIme (MAXIPIME) 2 g in sodium chloride 0.9 % 100 mL IVPB  Status:  Discontinued       Note to Pharmacy:  Cefepime 2 g IV q12h for CrCl < 60 mL/min   2 g 200 mL/hr over 30 Minutes Intravenous Every 12 hours 03/13/21 2326 03/13/21 2331   03/13/21 2000  piperacillin-tazobactam (ZOSYN) IVPB 3.375 g  Status:  Discontinued        3.375 g 12.5 mL/hr over 240 Minutes Intravenous Every 8 hours 03/13/21 1402 03/13/21 2320   03/13/21 1430  clindamycin (CLEOCIN) IVPB 900 mg        900 mg 100 mL/hr over 30 Minutes Intravenous  Once 03/13/21 1418 03/13/21 1548   03/13/21 1230  vancomycin (VANCOCIN) IVPB 1000 mg/200 mL premix        1,000 mg 200 mL/hr over 60 Minutes Intravenous  Once 03/13/21 1229 03/13/21 1506   03/13/21 1230  piperacillin-tazobactam (ZOSYN) IVPB 3.375 g        3.375 g 100 mL/hr over 30 Minutes Intravenous  Once 03/13/21 1229 03/13/21 1400  Time spent: 25 minutes-Greater than 50% of this time was spent in counseling, explanation of diagnosis, planning of further management, and coordination of care.  MEDICATIONS: Scheduled Meds: . abacavir-dolutegravir-lamiVUDine  1 tablet Oral Daily  . busPIRone  30 mg Oral BID  . clonazepam  0.5 mg Oral QID  . ezetimibe  10 mg Oral Daily  . feeding supplement  237 mL Oral BID BM  . feeding supplement (GLUCERNA SHAKE)  237 mL Oral Q24H  . fentaNYL  1 patch Transdermal Q72H  . Glecaprevir-Pibrentasvir  3 tablet Oral Daily  . heparin injection (subcutaneous)  5,000 Units Subcutaneous Q8H  . insulin aspart  0-15 Units Subcutaneous TID AC & HS  . [START ON 04/10/2021] linezolid  600 mg Oral Q12H  . metoprolol tartrate  25 mg Oral BID  . multivitamin with minerals  1 tablet Oral Daily  . pantoprazole  40 mg Oral QAC breakfast  . tamsulosin  0.4 mg Oral QHS  . tiZANidine  2 mg Oral QHS   Continuous Infusions: . sodium chloride 10 mL/hr at 03/31/21 1524  . DAPTOmycin (CUBICIN)  IV 700 mg (03/31/21 1938)  . lactated ringers 10 mL/hr at 03/25/21 1007   PRN Meds:.(feeding supplement) PROSource Plus, sodium chloride, acetaminophen **OR**  [DISCONTINUED] acetaminophen, antiseptic oral rinse, docusate sodium, feeding supplement, HYDROmorphone (DILAUDID) injection, naLOXone (NARCAN)  injection, [DISCONTINUED] ondansetron **OR** ondansetron (ZOFRAN) IV, oxyCODONE-acetaminophen **AND** oxyCODONE, polyethylene glycol, valACYclovir   PHYSICAL EXAM: Vital signs: Vitals:   03/31/21 2012 04/01/21 0622 04/01/21 0915 04/01/21 1502  BP: 117/74 99/80 127/64 106/84  Pulse: 77 61 73 (!) 106  Resp: 19 20  18   Temp: 98 F (36.7 C) 98.1 F (36.7 C)  97.8 F (36.6 C)  TempSrc: Axillary Axillary  Oral  SpO2: 99% 100%  100%  Weight:      Height:       Filed Weights   03/16/21 0356 03/19/21 1330 03/25/21 0944  Weight: 85.2 kg 85.2 kg 85.2 kg   Body mass index is 25.47 kg/m.   Gen Exam:Alert awake-not in any distress HEENT:atraumatic, normocephalic Chest: B/L clear to auscultation anteriorly CVS:S1S2 regular Abdomen:soft non tender, non distended Extremities:no edema Neurology: Non focal Skin: no rash  I have personally reviewed following labs and imaging studies  LABORATORY DATA: CBC: Recent Labs  Lab 03/26/21 0329 03/31/21 0633  WBC 5.4 5.2  NEUTROABS 3.1  --   HGB 9.5* 9.8*  HCT 29.4* 30.1*  MCV 99.3 98.4  PLT 332 939    Basic Metabolic Panel: Recent Labs  Lab 03/26/21 0329 03/31/21 0633  NA 137 136  K 4.7 4.8  CL 99 97*  CO2 32 29  GLUCOSE 207* 128*  BUN 23 33*  CREATININE 1.28* 1.38*  CALCIUM 8.7* 9.2  MG 1.9  --     GFR: Estimated Creatinine Clearance: 57 mL/min (A) (by C-G formula based on SCr of 1.38 mg/dL (H)).  Liver Function Tests: Recent Labs  Lab 03/26/21 0329  AST 16  ALT 10  ALKPHOS 81  BILITOT 0.6  PROT 5.9*  ALBUMIN 2.3*   No results for input(s): LIPASE, AMYLASE in the last 168 hours. No results for input(s): AMMONIA in the last 168 hours.  Coagulation Profile: No results for input(s): INR, PROTIME in the last 168 hours.  Cardiac Enzymes: Recent Labs  Lab  03/31/21 0633  CKTOTAL 40*    BNP (last 3 results) No results for input(s): PROBNP in the last 8760 hours.  Lipid Profile: No results for input(s):  CHOL, HDL, LDLCALC, TRIG, CHOLHDL, LDLDIRECT in the last 72 hours.  Thyroid Function Tests: No results for input(s): TSH, T4TOTAL, FREET4, T3FREE, THYROIDAB in the last 72 hours.  Anemia Panel: No results for input(s): VITAMINB12, FOLATE, FERRITIN, TIBC, IRON, RETICCTPCT in the last 72 hours.  Urine analysis:    Component Value Date/Time   COLORURINE YELLOW 03/13/2021 2300   APPEARANCEUR CLEAR 03/13/2021 2300   LABSPEC 1.044 (H) 03/13/2021 2300   PHURINE 5.0 03/13/2021 2300   GLUCOSEU 50 (A) 03/13/2021 2300   HGBUR MODERATE (A) 03/13/2021 2300   BILIRUBINUR NEGATIVE 03/13/2021 2300   KETONESUR NEGATIVE 03/13/2021 2300   PROTEINUR 30 (A) 03/13/2021 2300   NITRITE NEGATIVE 03/13/2021 2300   LEUKOCYTESUR NEGATIVE 03/13/2021 2300    Sepsis Labs: Lactic Acid, Venous    Component Value Date/Time   LATICACIDVEN 1.8 03/14/2021 0038    MICROBIOLOGY: No results found for this or any previous visit (from the past 240 hour(s)).  RADIOLOGY STUDIES/RESULTS: No results found.   LOS: 19 days   Oren Binet, MD  [facility]    To contact the attending provider between 7A-7P or the covering provider during after hours 7P-7A, please log into the web site [email or web address] and access using universal [place] password for that web site. If you do not have the password, please call the hospital operator.  04/01/2021, 3:16 PM

## 2021-04-02 ENCOUNTER — Ambulatory Visit: Payer: Medicare Other | Admitting: Thoracic Surgery (Cardiothoracic Vascular Surgery)

## 2021-04-02 DIAGNOSIS — N179 Acute kidney failure, unspecified: Secondary | ICD-10-CM | POA: Diagnosis not present

## 2021-04-02 DIAGNOSIS — A419 Sepsis, unspecified organism: Secondary | ICD-10-CM | POA: Diagnosis not present

## 2021-04-02 DIAGNOSIS — R6520 Severe sepsis without septic shock: Secondary | ICD-10-CM | POA: Diagnosis not present

## 2021-04-02 LAB — GLUCOSE, CAPILLARY
Glucose-Capillary: 123 mg/dL — ABNORMAL HIGH (ref 70–99)
Glucose-Capillary: 116 mg/dL — ABNORMAL HIGH (ref 70–99)
Glucose-Capillary: 151 mg/dL — ABNORMAL HIGH (ref 70–99)
Glucose-Capillary: 158 mg/dL — ABNORMAL HIGH (ref 70–99)
Glucose-Capillary: 164 mg/dL — ABNORMAL HIGH (ref 70–99)
Glucose-Capillary: 148 mg/dL — ABNORMAL HIGH (ref 70–99)

## 2021-04-02 MED ORDER — POLYETHYLENE GLYCOL 3350 17 G PO PACK
17.0000 g | PACK | Freq: Every day | ORAL | Status: DC
  Filled 2021-04-02: qty 1

## 2021-04-02 MED ORDER — SENNOSIDES-DOCUSATE SODIUM 8.6-50 MG PO TABS
2.0000 | ORAL_TABLET | Freq: Every evening | ORAL | Status: DC | PRN
  Administered 2021-04-02: 2 via ORAL
  Filled 2021-04-02: qty 2

## 2021-04-02 MED ORDER — BISACODYL 10 MG RE SUPP
10.0000 mg | Freq: Every day | RECTAL | Status: DC | PRN
  Filled 2021-04-02: qty 1

## 2021-04-02 NOTE — Progress Notes (Signed)
PROGRESS NOTE        PATIENT DETAILS Name: Brian Malone Age: 70 y.o. Sex: male Date of Birth: 01-Sep-1951 Admit Date: 03/13/2021 Admitting Physician Lequita Halt, MD [id number] [name], MD  Brief Narrative: Patient is a 70 y.o. male with history of chronic pain syndrome, hepatitis C, HIV, HTN, HLD, DM-2-who had sustained a fall in his yard on 3/6-following which she was diagnosed with acute right sixth, seventh and eighth rib fractures-he was treated conservatively-he presented to the ED on 3/25 with sepsis-and was found to have right chest wall abscess along with osteomyelitis.  See below for further details.  Significant events: 3/25>> admit to [facility]-right chest wall abscess/osteomyelitis. 3/30>> ultrasound guided aspiration of chest wall hematoma/abscess 3/31>> chest wall debridement-placement of wound VAC by cardiothoracic surgery  Significant studies: 3/25>> CT chest : Increasing size of right chest wall collection-possible abscess. 3/29>> CT chest: Interval decrease in size of a by lobar right anterolateral chest wall fluid collection, lucency in the anterior aspect of the right sixth and seventh ribs-suspicious for osteomyelitis. 4/3>> CT chest: No PE, destructive changes in the right anterior sixth rib concerning for osteomyelitis.  Antimicrobial therapy: None  Microbiology data: 3/25>> blood culture: No growth 3/26>> right chest wall hematoma/abscess: MRSA 3/30>> right chest wall fluid: MRSA 3/31>> right chest wall abscess: MRSA  Procedures : 3/26>> ultrasound-guided placement of 2 percutaneous drains in the right chest wall fluid collection 3/30>> ultrasound guided aspiration of chest wall hematoma/abscess 3/31>> chest wall debridement-placement of wound VAC by cardiothoracic surgery  Consults: ID, cardiothoracic surgery  DVT Prophylaxis : heparin injection 5,000 Units Start: 03/19/21 0600 SCDs Start: 03/13/21  2321   Subjective: No major issues overnight-pain adequately controlled.  Denies any chest pain or shortness of breath.  Assessment/Plan: Sepsis due to right-sided chest wall abscess-multiple rib fractures-osteomyelitis of the sixth rib-fluid cultures positive for MRSA: Sepsis physiology has resolved-s/p debridement-wound VAC in place-ID recommending 3 weeks of daptomycin from 3/31-and then to transition to oral Zyvox until seen at outpatient ID clinic.  Pain currently controlled with transdermal fentanyl 25 mcg-and as needed oral oxycodone.  With dressing changes he receives IV Dilaudid.  AKI on CKD stage II: Creatinine close to baseline-AKI was likely hemodynamically mediated.  HTN: BP controlled-continue metoprolol.  HLD: Continue Zetia  DM-2 (A1c 8.3 on 3/26): CBG stable-continue SSI  Recent Labs    04/02/21 0442 04/02/21 0747 04/02/21 1211  GLUCAP 123* 116* 151*    HIV: Continue antiretrovirals  Anxiety/depression: Stable-continue BuSpar/Klonopin.  Normocytic anemia: Due to acute illness-no indication of blood loss-follow.  Weekly.  GERD: Continue PPI  BPH: Continue Flomax  Nutrition Problem: Nutrition Problem: Severe Malnutrition Etiology: acute illness (rib fractures S/P surgeries and drain placement causing pain) Signs/Symptoms: severe muscle depletion,severe fat depletion Interventions: Ensure Enlive (each supplement provides 350kcal and 20 grams of protein),MVI,Prostat,Hormel Shake,Glucerna shake    Diet: Diet Order            Diet Carb Modified Fluid consistency: Thin; Room service appropriate? Yes  Diet effective now                  Code Status: Full code   Family Communication: None at bedside  Disposition Plan: Status is: Inpatient  Remains inpatient appropriate because:Inpatient level of care appropriate due to severity of illness   Dispo: The patient is from: Home  Anticipated d/c is to: Home              Patient  currently is not medically stable to d/c.   Difficult to place patient No    Barriers to Discharge: Awaiting SNF bed for IV antimicrobial therapy  Antimicrobial agents: Anti-infectives (From admission, onward)   Start     Dose/Rate Route Frequency Ordered Stop   04/10/21 1000  linezolid (ZYVOX) tablet 600 mg        600 mg Oral Every 12 hours 03/27/21 1410 05/01/21 0959   03/27/21 0000  linezolid (ZYVOX) 600 MG tablet       Note to Pharmacy: Patient staying through next week for IV abx - Just working ahead.   600 mg Oral 2 times daily 03/27/21 0933 04/17/21 2359   03/23/21 2000  DAPTOmycin (CUBICIN) 700 mg in sodium chloride 0.9 % IVPB        8 mg/kg  85.2 kg 128 mL/hr over 30 Minutes Intravenous Every 24 hours 03/23/21 1133 04/09/21 2359   03/22/21 1200  vancomycin (VANCOREADY) IVPB 1250 mg/250 mL  Status:  Discontinued        1,250 mg 166.7 mL/hr over 90 Minutes Intravenous Every 24 hours 03/21/21 1319 03/23/21 1133   03/20/21 1000  Glecaprevir-Pibrentasivir 100-40 mg (Mavyret) tabs 3 tablets - patient's own supply        3 tablet Oral Daily 03/20/21 0733     03/15/21 1200  vancomycin (VANCOREADY) IVPB 1500 mg/300 mL  Status:  Discontinued        1,500 mg 150 mL/hr over 120 Minutes Intravenous Every 24 hours 03/15/21 0732 03/21/21 1319   03/14/21 1200  vancomycin (VANCOREADY) IVPB 1250 mg/250 mL  Status:  Discontinued        1,250 mg 166.7 mL/hr over 90 Minutes Intravenous Every 24 hours 03/13/21 1359 03/15/21 0732   03/14/21 1023  valACYclovir (VALTREX) tablet 1,000 mg       Note to Pharmacy: Trig neuralgia flare     1,000 mg Oral 3 times daily PRN 03/14/21 1023     03/14/21 1000  abacavir-dolutegravir-lamiVUDine (TRIUMEQ) 600-50-300 MG per tablet 1 tablet        1 tablet Oral Daily 03/13/21 2320     03/14/21 0200  meropenem (MERREM) 1 g in sodium chloride 0.9 % 100 mL IVPB  Status:  Discontinued        1 g 200 mL/hr over 30 Minutes Intravenous Every 8 hours 03/13/21 2337  03/16/21 0719   03/14/21 0100  ceFEPIme (MAXIPIME) 2 g in sodium chloride 0.9 % 100 mL IVPB  Status:  Discontinued       Note to Pharmacy: Cefepime 2 g IV q12h for CrCl < 60 mL/min   2 g 200 mL/hr over 30 Minutes Intravenous Every 12 hours 03/13/21 2326 03/13/21 2331   03/13/21 2000  piperacillin-tazobactam (ZOSYN) IVPB 3.375 g  Status:  Discontinued        3.375 g 12.5 mL/hr over 240 Minutes Intravenous Every 8 hours 03/13/21 1402 03/13/21 2320   03/13/21 1430  clindamycin (CLEOCIN) IVPB 900 mg        900 mg 100 mL/hr over 30 Minutes Intravenous  Once 03/13/21 1418 03/13/21 1548   03/13/21 1230  vancomycin (VANCOCIN) IVPB 1000 mg/200 mL premix        1,000 mg 200 mL/hr over 60 Minutes Intravenous  Once 03/13/21 1229 03/13/21 1506   03/13/21 1230  piperacillin-tazobactam (ZOSYN) IVPB 3.375 g  3.375 g 100 mL/hr over 30 Minutes Intravenous  Once 03/13/21 1229 03/13/21 1400       Time spent: 15 minutes-Greater than 50% of this time was spent in counseling, explanation of diagnosis, planning of further management, and coordination of care.  MEDICATIONS: Scheduled Meds: . abacavir-dolutegravir-lamiVUDine  1 tablet Oral Daily  . busPIRone  30 mg Oral BID  . clonazepam  0.5 mg Oral QID  . ezetimibe  10 mg Oral Daily  . feeding supplement  237 mL Oral BID BM  . feeding supplement (GLUCERNA SHAKE)  237 mL Oral Q24H  . fentaNYL  1 patch Transdermal Q72H  . Glecaprevir-Pibrentasvir  3 tablet Oral Daily  . heparin injection (subcutaneous)  5,000 Units Subcutaneous Q8H  . insulin aspart  0-15 Units Subcutaneous TID AC & HS  . [START ON 04/10/2021] linezolid  600 mg Oral Q12H  . metoprolol tartrate  25 mg Oral BID  . multivitamin with minerals  1 tablet Oral Daily  . pantoprazole  40 mg Oral QAC breakfast  . tamsulosin  0.4 mg Oral QHS  . tiZANidine  2 mg Oral QHS   Continuous Infusions: . sodium chloride 10 mL/hr at 03/31/21 1524  . DAPTOmycin (CUBICIN)  IV 700 mg (04/01/21 2000)   . lactated ringers 10 mL/hr at 03/25/21 1007   PRN Meds:.(feeding supplement) PROSource Plus, sodium chloride, acetaminophen **OR** [DISCONTINUED] acetaminophen, antiseptic oral rinse, docusate sodium, feeding supplement, HYDROmorphone (DILAUDID) injection, naLOXone (NARCAN)  injection, [DISCONTINUED] ondansetron **OR** ondansetron (ZOFRAN) IV, oxyCODONE-acetaminophen **AND** oxyCODONE, polyethylene glycol, valACYclovir   PHYSICAL EXAM: Vital signs: Vitals:   04/01/21 0915 04/01/21 1502 04/01/21 2100 04/02/21 0438  BP: 127/64 106/84 115/66 111/67  Pulse: 73 (!) 106 76 65  Resp:  18 20 19   Temp:  97.8 F (36.6 C) 98.3 F (36.8 C) 97.7 F (36.5 C)  TempSrc:  Oral Oral Oral  SpO2:  100% 99% 100%  Weight:      Height:       Filed Weights   03/16/21 0356 03/19/21 1330 03/25/21 0944  Weight: 85.2 kg 85.2 kg 85.2 kg   Body mass index is 25.47 kg/m.   Gen Exam:Alert awake-not in any distress HEENT:atraumatic, normocephalic Chest: B/L clear to auscultation anteriorly CVS:S1S2 regular Abdomen:soft non tender, non distended Extremities:no edema Neurology: Non focal Skin: no rash  I have personally reviewed following labs and imaging studies  LABORATORY DATA: CBC: Recent Labs  Lab 03/31/21 0633  WBC 5.2  HGB 9.8*  HCT 30.1*  MCV 98.4  PLT 209    Basic Metabolic Panel: Recent Labs  Lab 03/31/21 0633  NA 136  K 4.8  CL 97*  CO2 29  GLUCOSE 128*  BUN 33*  CREATININE 1.38*  CALCIUM 9.2    GFR: Estimated Creatinine Clearance: 57 mL/min (A) (by C-G formula based on SCr of 1.38 mg/dL (H)).  Liver Function Tests: No results for input(s): AST, ALT, ALKPHOS, BILITOT, PROT, ALBUMIN in the last 168 hours. No results for input(s): LIPASE, AMYLASE in the last 168 hours. No results for input(s): AMMONIA in the last 168 hours.  Coagulation Profile: No results for input(s): INR, PROTIME in the last 168 hours.  Cardiac Enzymes: Recent Labs  Lab 03/31/21 0633   CKTOTAL 40*    BNP (last 3 results) No results for input(s): PROBNP in the last 8760 hours.  Lipid Profile: No results for input(s): CHOL, HDL, LDLCALC, TRIG, CHOLHDL, LDLDIRECT in the last 72 hours.  Thyroid Function Tests: No results for input(s): TSH,  T4TOTAL, FREET4, T3FREE, THYROIDAB in the last 72 hours.  Anemia Panel: No results for input(s): VITAMINB12, FOLATE, FERRITIN, TIBC, IRON, RETICCTPCT in the last 72 hours.  Urine analysis:    Component Value Date/Time   COLORURINE YELLOW 03/13/2021 2300   APPEARANCEUR CLEAR 03/13/2021 2300   LABSPEC 1.044 (H) 03/13/2021 2300   PHURINE 5.0 03/13/2021 2300   GLUCOSEU 50 (A) 03/13/2021 2300   HGBUR MODERATE (A) 03/13/2021 2300   BILIRUBINUR NEGATIVE 03/13/2021 2300   KETONESUR NEGATIVE 03/13/2021 2300   PROTEINUR 30 (A) 03/13/2021 2300   NITRITE NEGATIVE 03/13/2021 2300   LEUKOCYTESUR NEGATIVE 03/13/2021 2300    Sepsis Labs: Lactic Acid, Venous    Component Value Date/Time   LATICACIDVEN 1.8 03/14/2021 0038    MICROBIOLOGY: No results found for this or any previous visit (from the past 240 hour(s)).  RADIOLOGY STUDIES/RESULTS: No results found.   LOS: 20 days   Oren Binet, MD  [facility]    To contact the attending provider between 7A-7P or the covering provider during after hours 7P-7A, please log into the web site [email or web address] and access using universal [place] password for that web site. If you do not have the password, please call the hospital operator.  04/02/2021, 12:15 PM
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
## 2021-04-02 NOTE — TOC Progression Note (Signed)
Transition of Care [facility]) - Progression Note    Patient Details  Name: Brian Malone MRN: [id number] Date of Birth: May 26, 1951  Transition of Care [facility]) CM/SW [place], LCSW Phone Number: 04/02/2021, 8:55 AM  Clinical Narrative:    Patient currently has no SNF bed offers. Will continue search since patient requires IV antibiotics until 4/21.   Expected Discharge Plan:  (TBD) Barriers to Discharge: Continued Medical Work up  Expected Discharge Plan and Services Expected Discharge Plan:  (TBD)   Discharge Planning Services: CM Consult   Living arrangements for the past 2 months: Single Family Home                                       Social Determinants of Health (SDOH) Interventions    Readmission Risk Interventions No flowsheet data found.

## 2021-04-03 ENCOUNTER — Other Ambulatory Visit (HOSPITAL_COMMUNITY): Payer: Self-pay

## 2021-04-03 DIAGNOSIS — R6520 Severe sepsis without septic shock: Secondary | ICD-10-CM | POA: Diagnosis not present

## 2021-04-03 DIAGNOSIS — A419 Sepsis, unspecified organism: Secondary | ICD-10-CM | POA: Diagnosis not present

## 2021-04-03 DIAGNOSIS — N179 Acute kidney failure, unspecified: Secondary | ICD-10-CM | POA: Diagnosis not present

## 2021-04-03 LAB — GLUCOSE, CAPILLARY
Glucose-Capillary: 158 mg/dL — ABNORMAL HIGH (ref 70–99)
Glucose-Capillary: 90 mg/dL (ref 70–99)
Glucose-Capillary: 169 mg/dL — ABNORMAL HIGH (ref 70–99)
Glucose-Capillary: 144 mg/dL — ABNORMAL HIGH (ref 70–99)
Glucose-Capillary: 177 mg/dL — ABNORMAL HIGH (ref 70–99)

## 2021-04-03 LAB — HCV RNA QUANT: HCV Quantitative: NOT DETECTED IU/mL (ref >50)

## 2021-04-03 MED ORDER — NALOXEGOL OXALATE 12.5 MG PO TABS
12.5000 mg | ORAL_TABLET | Freq: Every day | ORAL | Status: DC
  Administered 2021-04-03 – 2021-04-10 (×8): 12.5 mg via ORAL
  Filled 2021-04-03 (×9): qty 1

## 2021-04-03 MED ORDER — GLECAPREVIR-PIBRENTASVIR 100-40 MG PO TABS
3.0000 | ORAL_TABLET | Freq: Every day | ORAL | Status: DC
  Administered 2021-04-03 – 2021-04-10 (×8): 3 via ORAL
  Filled 2021-04-03 (×8): qty 3

## 2021-04-03 NOTE — Progress Notes (Signed)
PROGRESS NOTE        PATIENT DETAILS Name: Brian Malone Age: 70 y.o. Sex: male Date of Birth: 01/14/1951 Admit Date: 03/13/2021 Admitting Physician Lequita Halt, MD TIW:[id number] [name], MD  Brief Narrative: Patient is a 70 y.o. male with history of chronic pain syndrome, hepatitis C, HIV, HTN, HLD, DM-2-who had sustained a fall in his yard on 3/6-following which she was diagnosed with acute right sixth, seventh and eighth rib fractures-he was treated conservatively-he presented to the ED on 3/25 with sepsis-and was found to have right chest wall abscess along with osteomyelitis.  See below for further details.  Significant events: 3/25>> admit to [facility]-right chest wall abscess/osteomyelitis. 3/30>> ultrasound guided aspiration of chest wall hematoma/abscess 3/31>> chest wall debridement-placement of wound VAC by cardiothoracic surgery  Significant studies: 3/25>> CT chest : Increasing size of right chest wall collection-possible abscess. 3/29>> CT chest: Interval decrease in size of a by lobar right anterolateral chest wall fluid collection, lucency in the anterior aspect of the right sixth and seventh ribs-suspicious for osteomyelitis. 4/3>> CT chest: No PE, destructive changes in the right anterior sixth rib concerning for osteomyelitis.  Antimicrobial therapy: None  Microbiology data: 3/25>> blood culture: No growth 3/26>> right chest wall hematoma/abscess: MRSA 3/30>> right chest wall fluid: MRSA 3/31>> right chest wall abscess: MRSA  Procedures : 3/26>> ultrasound-guided placement of 2 percutaneous drains in the right chest wall fluid collection 3/30>> ultrasound guided aspiration of chest wall hematoma/abscess 3/31>> chest wall debridement-placement of wound VAC by cardiothoracic surgery  Consults: ID, cardiothoracic surgery  DVT Prophylaxis : heparin injection 5,000 Units Start: 03/19/21 0600 SCDs Start: 03/13/21  2321   Subjective: Lying comfortably in bed-no major issues overnight.  Assessment/Plan: Sepsis due to right-sided chest wall abscess-multiple rib fractures-osteomyelitis of the sixth rib-fluid cultures positive for MRSA: Sepsis physiology has resolved-s/p debridement-wound VAC in place-ID recommending 3 weeks of daptomycin from 3/31-and then to transition to oral Zyvox until seen at outpatient ID clinic.  Pain currently controlled with transdermal fentanyl 25 mcg-and as needed oral oxycodone.  With dressing changes he receives IV Dilaudid.  AKI on CKD stage II: Creatinine close to baseline-AKI was likely hemodynamically mediated.  HTN: BP controlled-continue metoprolol.  HLD: Continue Zetia  DM-2 (A1c 8.3 on 3/26): CBG stable-continue SSI  Recent Labs    04/02/21 2039 04/03/21 0555 04/03/21 0806  GLUCAP 148* 158* 90    HIV: Continue antiretrovirals  Opioid-induced constipation: Starting Movantik.  Anxiety/depression: Stable-continue BuSpar/Klonopin.  Normocytic anemia: Due to acute illness-no indication of blood loss-follow.  Weekly.  GERD: Continue PPI  BPH: Continue Flomax  Nutrition Problem: Nutrition Problem: Severe Malnutrition Etiology: acute illness (rib fractures S/P surgeries and drain placement causing pain) Signs/Symptoms: severe muscle depletion,severe fat depletion Interventions: Ensure Enlive (each supplement provides 350kcal and 20 grams of protein),MVI,Prostat,Hormel Shake,Glucerna shake    Diet: Diet Order            Diet Carb Modified Fluid consistency: Thin; Room service appropriate? Yes  Diet effective now                  Code Status: Full code   Family Communication: None at bedside  Disposition Plan: Status is: Inpatient  Remains inpatient appropriate because:Inpatient level of care appropriate due to severity of illness   Dispo: The patient is from: Home  Anticipated d/c is to: Home              Patient  currently is not medically stable to d/c.   Difficult to place patient No    Barriers to Discharge: Awaiting SNF bed for IV antimicrobial therapy  Antimicrobial agents: Anti-infectives (From admission, onward)   Start     Dose/Rate Route Frequency Ordered Stop   04/10/21 1000  linezolid (ZYVOX) tablet 600 mg        600 mg Oral Every 12 hours 03/27/21 1410 05/01/21 0959   03/27/21 0000  linezolid (ZYVOX) 600 MG tablet       Note to Pharmacy: Patient staying through next week for IV abx - Just working ahead.   600 mg Oral 2 times daily 03/27/21 0933 04/17/21 2359   03/23/21 2000  DAPTOmycin (CUBICIN) 700 mg in sodium chloride 0.9 % IVPB        8 mg/kg  85.2 kg 128 mL/hr over 30 Minutes Intravenous Every 24 hours 03/23/21 1133 04/09/21 2359   03/22/21 1200  vancomycin (VANCOREADY) IVPB 1250 mg/250 mL  Status:  Discontinued        1,250 mg 166.7 mL/hr over 90 Minutes Intravenous Every 24 hours 03/21/21 1319 03/23/21 1133   03/20/21 1000  Glecaprevir-Pibrentasivir 100-40 mg (Mavyret) tabs 3 tablets - patient's own supply  Status:  Discontinued        3 tablet Oral Daily 03/20/21 0733 04/03/21 0957   03/15/21 1200  vancomycin (VANCOREADY) IVPB 1500 mg/300 mL  Status:  Discontinued        1,500 mg 150 mL/hr over 120 Minutes Intravenous Every 24 hours 03/15/21 0732 03/21/21 1319   03/14/21 1200  vancomycin (VANCOREADY) IVPB 1250 mg/250 mL  Status:  Discontinued        1,250 mg 166.7 mL/hr over 90 Minutes Intravenous Every 24 hours 03/13/21 1359 03/15/21 0732   03/14/21 1023  valACYclovir (VALTREX) tablet 1,000 mg       Note to Pharmacy: Trig neuralgia flare     1,000 mg Oral 3 times daily PRN 03/14/21 1023     03/14/21 1000  abacavir-dolutegravir-lamiVUDine (TRIUMEQ) 600-50-300 MG per tablet 1 tablet        1 tablet Oral Daily 03/13/21 2320     03/14/21 0200  meropenem (MERREM) 1 g in sodium chloride 0.9 % 100 mL IVPB  Status:  Discontinued        1 g 200 mL/hr over 30 Minutes  Intravenous Every 8 hours 03/13/21 2337 03/16/21 0719   03/14/21 0100  ceFEPIme (MAXIPIME) 2 g in sodium chloride 0.9 % 100 mL IVPB  Status:  Discontinued       Note to Pharmacy: Cefepime 2 g IV q12h for CrCl < 60 mL/min   2 g 200 mL/hr over 30 Minutes Intravenous Every 12 hours 03/13/21 2326 03/13/21 2331   03/13/21 2000  piperacillin-tazobactam (ZOSYN) IVPB 3.375 g  Status:  Discontinued        3.375 g 12.5 mL/hr over 240 Minutes Intravenous Every 8 hours 03/13/21 1402 03/13/21 2320   03/13/21 1430  clindamycin (CLEOCIN) IVPB 900 mg        900 mg 100 mL/hr over 30 Minutes Intravenous  Once 03/13/21 1418 03/13/21 1548   03/13/21 1230  vancomycin (VANCOCIN) IVPB 1000 mg/200 mL premix        1,000 mg 200 mL/hr over 60 Minutes Intravenous  Once 03/13/21 1229 03/13/21 1506   03/13/21 1230  piperacillin-tazobactam (ZOSYN) IVPB 3.375 g  3.375 g 100 mL/hr over 30 Minutes Intravenous  Once 03/13/21 1229 03/13/21 1400       Time spent: 15 minutes-Greater than 50% of this time was spent in counseling, explanation of diagnosis, planning of further management, and coordination of care.  MEDICATIONS: Scheduled Meds: . abacavir-dolutegravir-lamiVUDine  1 tablet Oral Daily  . busPIRone  30 mg Oral BID  . clonazepam  0.5 mg Oral QID  . ezetimibe  10 mg Oral Daily  . feeding supplement  237 mL Oral BID BM  . feeding supplement (GLUCERNA SHAKE)  237 mL Oral Q24H  . fentaNYL  1 patch Transdermal Q72H  . heparin injection (subcutaneous)  5,000 Units Subcutaneous Q8H  . insulin aspart  0-15 Units Subcutaneous TID AC & HS  . [START ON 04/10/2021] linezolid  600 mg Oral Q12H  . metoprolol tartrate  25 mg Oral BID  . multivitamin with minerals  1 tablet Oral Daily  . naloxegol oxalate  12.5 mg Oral QAC breakfast  . pantoprazole  40 mg Oral QAC breakfast  . tamsulosin  0.4 mg Oral QHS  . tiZANidine  2 mg Oral QHS   Continuous Infusions: . sodium chloride 10 mL/hr at 03/31/21 1524  .  DAPTOmycin (CUBICIN)  IV 700 mg (04/02/21 2159)  . lactated ringers 10 mL/hr at 03/25/21 1007   PRN Meds:.(feeding supplement) PROSource Plus, sodium chloride, acetaminophen **OR** [DISCONTINUED] acetaminophen, antiseptic oral rinse, bisacodyl, docusate sodium, feeding supplement, naLOXone (NARCAN)  injection, [DISCONTINUED] ondansetron **OR** ondansetron (ZOFRAN) IV, oxyCODONE-acetaminophen **AND** oxyCODONE, senna-docusate, valACYclovir   PHYSICAL EXAM: Vital signs: Vitals:   04/02/21 0438 04/02/21 1712 04/02/21 2034 04/03/21 0441  BP: 111/67 122/75 130/74 107/64  Pulse: 65 83 80 72  Resp: 19 16 17 20   Temp: 97.7 F (36.5 C) 97.8 F (36.6 C) 98.1 F (36.7 C) 98.1 F (36.7 C)  TempSrc: Oral Oral Oral Oral  SpO2: 100% 100% 100% 99%  Weight:      Height:       Filed Weights   03/16/21 0356 03/19/21 1330 03/25/21 0944  Weight: 85.2 kg 85.2 kg 85.2 kg   Body mass index is 25.47 kg/m.   Gen Exam:Alert awake-not in any distress HEENT:atraumatic, normocephalic Chest: B/L clear to auscultation anteriorly CVS:S1S2 regular Abdomen:soft non tender, non distended Extremities:no edema Neurology: Non focal Skin: no rash  I have personally reviewed following labs and imaging studies  LABORATORY DATA: CBC: Recent Labs  Lab 03/31/21 0633  WBC 5.2  HGB 9.8*  HCT 30.1*  MCV 98.4  PLT 734    Basic Metabolic Panel: Recent Labs  Lab 03/31/21 0633  NA 136  K 4.8  CL 97*  CO2 29  GLUCOSE 128*  BUN 33*  CREATININE 1.38*  CALCIUM 9.2    GFR: Estimated Creatinine Clearance: 57 mL/min (A) (by C-G formula based on SCr of 1.38 mg/dL (H)).  Liver Function Tests: No results for input(s): AST, ALT, ALKPHOS, BILITOT, PROT, ALBUMIN in the last 168 hours. No results for input(s): LIPASE, AMYLASE in the last 168 hours. No results for input(s): AMMONIA in the last 168 hours.  Coagulation Profile: No results for input(s): INR, PROTIME in the last 168 hours.  Cardiac  Enzymes: Recent Labs  Lab 03/31/21 0633  CKTOTAL 40*    BNP (last 3 results) No results for input(s): PROBNP in the last 8760 hours.  Lipid Profile: No results for input(s): CHOL, HDL, LDLCALC, TRIG, CHOLHDL, LDLDIRECT in the last 72 hours.  Thyroid Function Tests: No results for input(s):  TSH, T4TOTAL, FREET4, T3FREE, THYROIDAB in the last 72 hours.  Anemia Panel: No results for input(s): VITAMINB12, FOLATE, FERRITIN, TIBC, IRON, RETICCTPCT in the last 72 hours.  Urine analysis:    Component Value Date/Time   COLORURINE YELLOW 03/13/2021 2300   APPEARANCEUR CLEAR 03/13/2021 2300   LABSPEC 1.044 (H) 03/13/2021 2300   PHURINE 5.0 03/13/2021 2300   GLUCOSEU 50 (A) 03/13/2021 2300   HGBUR MODERATE (A) 03/13/2021 2300   BILIRUBINUR NEGATIVE 03/13/2021 2300   KETONESUR NEGATIVE 03/13/2021 2300   PROTEINUR 30 (A) 03/13/2021 2300   NITRITE NEGATIVE 03/13/2021 2300   LEUKOCYTESUR NEGATIVE 03/13/2021 2300    Sepsis Labs: Lactic Acid, Venous    Component Value Date/Time   LATICACIDVEN 1.8 03/14/2021 0038    MICROBIOLOGY: No results found for this or any previous visit (from the past 240 hour(s)).  RADIOLOGY STUDIES/RESULTS: No results found.   LOS: 21 days   Oren Binet, MD  [facility]    To contact the attending provider between 7A-7P or the covering provider during after hours 7P-7A, please log into the web site [email or web address] and access using universal [place] password for that web site. If you do not have the password, please call the hospital operator.  04/03/2021, 10:50 AM

## 2021-04-03 NOTE — Consult Note (Signed)
WOC Nurse Consult Note: Patient receiving care in [facility] [phone]. Reason for Consult: NPWT dressing change to right chest Wound type: surgical Pressure Injury POA: Yes/No/NA Measurement: 7 cm x 11.5 cm x 1.6 cm.  The undermining at 12 o'clock is now 1.6 cm Wound bed: pink and red with cellular matrix in wound bed Drainage (amount, consistency, odor)  serosanginous in canister Periwound: intact Dressing procedure/placement/frequency: one piece of black foam removed from wound bed after patient received IV pain med, and with liberal soaking with saline and gentle peeling off of wound bed. Patient tolerated very well. One piece of Mepitel placed into wound bed, then one piece of black foam. Drape applied. Immediate seal obtained. Val Riles, RN, MSN, CWOCN, CNS-BC, pager [phone]

## 2021-04-03 NOTE — Plan of Care (Signed)
Patient is currently resting in bed. C/o pain, given PRN pain meds. VSS. Voiding. OOB ambulating to BR. Wound vac in place, drsg intact. Plan for drsg change today. Call bell within reach.  Problem: Education: Goal: Knowledge of General Education information will improve Description: Including pain rating scale, medication(s)/side effects and non-pharmacologic comfort measures Outcome: Progressing   Problem: Health Behavior/Discharge Planning: Goal: Ability to manage health-related needs will improve Outcome: Progressing   Problem: Clinical Measurements: Goal: Ability to maintain clinical measurements within normal limits will improve Outcome: Progressing Goal: Respiratory complications will improve Outcome: Progressing   Problem: Activity: Goal: Risk for activity intolerance will decrease Outcome: Progressing   Problem: Pain Managment: Goal: General experience of comfort will improve Outcome: Progressing

## 2021-04-04 DIAGNOSIS — N179 Acute kidney failure, unspecified: Secondary | ICD-10-CM | POA: Diagnosis not present

## 2021-04-04 DIAGNOSIS — A419 Sepsis, unspecified organism: Secondary | ICD-10-CM | POA: Diagnosis not present

## 2021-04-04 DIAGNOSIS — R6520 Severe sepsis without septic shock: Secondary | ICD-10-CM | POA: Diagnosis not present

## 2021-04-04 LAB — GLUCOSE, CAPILLARY
Glucose-Capillary: 102 mg/dL — ABNORMAL HIGH (ref 70–99)
Glucose-Capillary: 105 mg/dL — ABNORMAL HIGH (ref 70–99)
Glucose-Capillary: 165 mg/dL — ABNORMAL HIGH (ref 70–99)
Glucose-Capillary: 166 mg/dL — ABNORMAL HIGH (ref 70–99)
Glucose-Capillary: 173 mg/dL — ABNORMAL HIGH (ref 70–99)

## 2021-04-04 NOTE — Progress Notes (Signed)
PROGRESS NOTE        PATIENT DETAILS Name: Brian Malone Age: 70 y.o. Sex: male Date of Birth: 1951/12/14 Admit Date: 03/13/2021 Admitting Physician Lequita Halt, MD TKW:IOXBD [name], MD  Brief Narrative: Patient is a 70 y.o. male with history of chronic pain syndrome, hepatitis C, HIV, HTN, HLD, DM-2-who had sustained a fall in his yard on 3/6-following which she was diagnosed with acute right sixth, seventh and eighth rib fractures-he was treated conservatively-he presented to the ED on 3/25 with sepsis-and was found to have right chest wall abscess along with osteomyelitis.  See below for further details.  Significant events: 3/25>> admit to [facility]-right chest wall abscess/osteomyelitis. 3/30>> ultrasound guided aspiration of chest wall hematoma/abscess 3/31>> chest wall debridement-placement of wound VAC by cardiothoracic surgery  Significant studies: 3/25>> CT chest : Increasing size of right chest wall collection-possible abscess. 3/29>> CT chest: Interval decrease in size of a by lobar right anterolateral chest wall fluid collection, lucency in the anterior aspect of the right sixth and seventh ribs-suspicious for osteomyelitis. 4/3>> CT chest: No PE, destructive changes in the right anterior sixth rib concerning for osteomyelitis.  Antimicrobial therapy: None  Microbiology data: 3/25>> blood culture: No growth 3/26>> right chest wall hematoma/abscess: MRSA 3/30>> right chest wall fluid: MRSA 3/31>> right chest wall abscess: MRSA  Procedures : 3/26>> ultrasound-guided placement of 2 percutaneous drains in the right chest wall fluid collection 3/30>> ultrasound guided aspiration of chest wall hematoma/abscess 3/31>> chest wall debridement-placement of wound VAC by cardiothoracic surgery  Consults: ID, cardiothoracic surgery  DVT Prophylaxis : heparin injection 5,000 Units Start: 03/19/21 0600 SCDs Start: 03/13/21  2321   Subjective: Denies any chest pain or shortness of breath-lying comfortably in bed.  Assessment/Plan: Sepsis due to right-sided chest wall abscess-multiple rib fractures-osteomyelitis of the sixth rib-fluid cultures positive for MRSA: Sepsis physiology has resolved-s/p debridement-wound VAC in place-ID recommending 3 weeks of daptomycin from 3/31-and then to transition to oral Zyvox until seen at outpatient ID clinic.  Pain currently controlled with transdermal fentanyl 25 mcg-and as needed oral oxycodone.  With dressing changes he receives IV Dilaudid.  AKI on CKD stage II: Creatinine close to baseline-AKI was likely hemodynamically mediated.  HTN: BP controlled-continue metoprolol.  HLD: Continue Zetia  DM-2 (A1c 8.3 on 3/26): CBG stable-continue SSI  Recent Labs    04/03/21 2134 04/04/21 0617 04/04/21 0744  GLUCAP 177* 102* 105*    HIV: Continue antiretrovirals  Opioid-induced constipation: Starting Movantik.  Anxiety/depression: Stable-continue BuSpar/Klonopin.  Normocytic anemia: Due to acute illness-no indication of blood loss-follow.  Weekly.  GERD: Continue PPI  BPH: Continue Flomax  Nutrition Problem: Nutrition Problem: Severe Malnutrition Etiology: acute illness (rib fractures S/P surgeries and drain placement causing pain) Signs/Symptoms: severe muscle depletion,severe fat depletion Interventions: Ensure Enlive (each supplement provides 350kcal and 20 grams of protein),MVI,Prostat,Hormel Shake,Glucerna shake    Diet: Diet Order            Diet Carb Modified Fluid consistency: Thin; Room service appropriate? Yes  Diet effective now                  Code Status: Full code   Family Communication: None at bedside  Disposition Plan: Status is: Inpatient  Remains inpatient appropriate because:Inpatient level of care appropriate due to severity of illness   Dispo: The patient is from: Home  Anticipated d/c is to: Home               Patient currently is not medically stable to d/c.   Difficult to place patient No    Barriers to Discharge: Awaiting SNF bed for IV antimicrobial therapy  Antimicrobial agents: Anti-infectives (From admission, onward)   Start     Dose/Rate Route Frequency Ordered Stop   04/10/21 1000  linezolid (ZYVOX) tablet 600 mg        600 mg Oral Every 12 hours 03/27/21 1410 05/01/21 0959   04/03/21 1415  Glecaprevir-Pibrentasvir 100-40 MG TABS 3 tablet        3 tablet Oral Daily 04/03/21 1319 04/13/21 0959   03/27/21 0000  linezolid (ZYVOX) 600 MG tablet       Note to Pharmacy: Patient staying through next week for IV abx - Just working ahead.   600 mg Oral 2 times daily 03/27/21 0933 04/17/21 2359   03/23/21 2000  DAPTOmycin (CUBICIN) 700 mg in sodium chloride 0.9 % IVPB        8 mg/kg  85.2 kg 128 mL/hr over 30 Minutes Intravenous Every 24 hours 03/23/21 1133 04/09/21 2359   03/22/21 1200  vancomycin (VANCOREADY) IVPB 1250 mg/250 mL  Status:  Discontinued        1,250 mg 166.7 mL/hr over 90 Minutes Intravenous Every 24 hours 03/21/21 1319 03/23/21 1133   03/20/21 1000  Glecaprevir-Pibrentasivir 100-40 mg (Mavyret) tabs 3 tablets - patient's own supply  Status:  Discontinued        3 tablet Oral Daily 03/20/21 0733 04/03/21 0957   03/15/21 1200  vancomycin (VANCOREADY) IVPB 1500 mg/300 mL  Status:  Discontinued        1,500 mg 150 mL/hr over 120 Minutes Intravenous Every 24 hours 03/15/21 0732 03/21/21 1319   03/14/21 1200  vancomycin (VANCOREADY) IVPB 1250 mg/250 mL  Status:  Discontinued        1,250 mg 166.7 mL/hr over 90 Minutes Intravenous Every 24 hours 03/13/21 1359 03/15/21 0732   03/14/21 1023  valACYclovir (VALTREX) tablet 1,000 mg       Note to Pharmacy: Trig neuralgia flare     1,000 mg Oral 3 times daily PRN 03/14/21 1023     03/14/21 1000  abacavir-dolutegravir-lamiVUDine (TRIUMEQ) 600-50-300 MG per tablet 1 tablet        1 tablet Oral Daily 03/13/21 2320     03/14/21  0200  meropenem (MERREM) 1 g in sodium chloride 0.9 % 100 mL IVPB  Status:  Discontinued        1 g 200 mL/hr over 30 Minutes Intravenous Every 8 hours 03/13/21 2337 03/16/21 0719   03/14/21 0100  ceFEPIme (MAXIPIME) 2 g in sodium chloride 0.9 % 100 mL IVPB  Status:  Discontinued       Note to Pharmacy: Cefepime 2 g IV q12h for CrCl < 60 mL/min   2 g 200 mL/hr over 30 Minutes Intravenous Every 12 hours 03/13/21 2326 03/13/21 2331   03/13/21 2000  piperacillin-tazobactam (ZOSYN) IVPB 3.375 g  Status:  Discontinued        3.375 g 12.5 mL/hr over 240 Minutes Intravenous Every 8 hours 03/13/21 1402 03/13/21 2320   03/13/21 1430  clindamycin (CLEOCIN) IVPB 900 mg        900 mg 100 mL/hr over 30 Minutes Intravenous  Once 03/13/21 1418 03/13/21 1548   03/13/21 1230  vancomycin (VANCOCIN) IVPB 1000 mg/200 mL premix  1,000 mg 200 mL/hr over 60 Minutes Intravenous  Once 03/13/21 1229 03/13/21 1506   03/13/21 1230  piperacillin-tazobactam (ZOSYN) IVPB 3.375 g        3.375 g 100 mL/hr over 30 Minutes Intravenous  Once 03/13/21 1229 03/13/21 1400       Time spent: 15 minutes-Greater than 50% of this time was spent in counseling, explanation of diagnosis, planning of further management, and coordination of care.  MEDICATIONS: Scheduled Meds: . abacavir-dolutegravir-lamiVUDine  1 tablet Oral Daily  . busPIRone  30 mg Oral BID  . clonazepam  0.5 mg Oral QID  . ezetimibe  10 mg Oral Daily  . feeding supplement  237 mL Oral BID BM  . feeding supplement (GLUCERNA SHAKE)  237 mL Oral Q24H  . fentaNYL  1 patch Transdermal Q72H  . Glecaprevir-Pibrentasvir  3 tablet Oral Daily  . heparin injection (subcutaneous)  5,000 Units Subcutaneous Q8H  . insulin aspart  0-15 Units Subcutaneous TID AC & HS  . [START ON 04/10/2021] linezolid  600 mg Oral Q12H  . metoprolol tartrate  25 mg Oral BID  . multivitamin with minerals  1 tablet Oral Daily  . naloxegol oxalate  12.5 mg Oral QAC breakfast  .  pantoprazole  40 mg Oral QAC breakfast  . tamsulosin  0.4 mg Oral QHS  . tiZANidine  2 mg Oral QHS   Continuous Infusions: . sodium chloride 10 mL/hr at 03/31/21 1524  . DAPTOmycin (CUBICIN)  IV 700 mg (04/03/21 2206)  . lactated ringers 10 mL/hr at 03/25/21 1007   PRN Meds:.(feeding supplement) PROSource Plus, sodium chloride, acetaminophen **OR** [DISCONTINUED] acetaminophen, antiseptic oral rinse, bisacodyl, docusate sodium, feeding supplement, naLOXone (NARCAN)  injection, [DISCONTINUED] ondansetron **OR** ondansetron (ZOFRAN) IV, oxyCODONE-acetaminophen **AND** oxyCODONE, senna-docusate, valACYclovir   PHYSICAL EXAM: Vital signs: Vitals:   04/03/21 1726 04/03/21 2136 04/04/21 0412 04/04/21 0614  BP: 123/74 129/72  104/63  Pulse: 87 87  64  Resp:  18  16  Temp:  98.2 F (36.8 C)  97.7 F (36.5 C)  TempSrc:  Oral  Oral  SpO2:  100%  100%  Weight:   86.2 kg   Height:       Filed Weights   03/19/21 1330 03/25/21 0944 04/04/21 0412  Weight: 85.2 kg 85.2 kg 86.2 kg   Body mass index is 25.77 kg/m.   Gen Exam:Alert awake-not in any distress HEENT:atraumatic, normocephalic Chest: B/L clear to auscultation anteriorly CVS:S1S2 regular Abdomen:soft non tender, non distended Extremities:no edema Neurology: Non focal Skin: no rash  I have personally reviewed following labs and imaging studies  LABORATORY DATA: CBC: Recent Labs  Lab 03/31/21 0633  WBC 5.2  HGB 9.8*  HCT 30.1*  MCV 98.4  PLT 814    Basic Metabolic Panel: Recent Labs  Lab 03/31/21 0633  NA 136  K 4.8  CL 97*  CO2 29  GLUCOSE 128*  BUN 33*  CREATININE 1.38*  CALCIUM 9.2    GFR: Estimated Creatinine Clearance: 57 mL/min (A) (by C-G formula based on SCr of 1.38 mg/dL (H)).  Liver Function Tests: No results for input(s): AST, ALT, ALKPHOS, BILITOT, PROT, ALBUMIN in the last 168 hours. No results for input(s): LIPASE, AMYLASE in the last 168 hours. No results for input(s): AMMONIA in the  last 168 hours.  Coagulation Profile: No results for input(s): INR, PROTIME in the last 168 hours.  Cardiac Enzymes: Recent Labs  Lab 03/31/21 0633  CKTOTAL 40*    BNP (last 3 results) No results  for input(s): PROBNP in the last 8760 hours.  Lipid Profile: No results for input(s): CHOL, HDL, LDLCALC, TRIG, CHOLHDL, LDLDIRECT in the last 72 hours.  Thyroid Function Tests: No results for input(s): TSH, T4TOTAL, FREET4, T3FREE, THYROIDAB in the last 72 hours.  Anemia Panel: No results for input(s): VITAMINB12, FOLATE, FERRITIN, TIBC, IRON, RETICCTPCT in the last 72 hours.  Urine analysis:    Component Value Date/Time   COLORURINE YELLOW 03/13/2021 2300   APPEARANCEUR CLEAR 03/13/2021 2300   LABSPEC 1.044 (H) 03/13/2021 2300   PHURINE 5.0 03/13/2021 2300   GLUCOSEU 50 (A) 03/13/2021 2300   HGBUR MODERATE (A) 03/13/2021 2300   BILIRUBINUR NEGATIVE 03/13/2021 2300   KETONESUR NEGATIVE 03/13/2021 2300   PROTEINUR 30 (A) 03/13/2021 2300   NITRITE NEGATIVE 03/13/2021 2300   LEUKOCYTESUR NEGATIVE 03/13/2021 2300    Sepsis Labs: Lactic Acid, Venous    Component Value Date/Time   LATICACIDVEN 1.8 03/14/2021 0038    MICROBIOLOGY: No results found for this or any previous visit (from the past 240 hour(s)).  RADIOLOGY STUDIES/RESULTS: No results found.   LOS: 22 days   Oren Binet, MD  [facility]    To contact the attending provider between 7A-7P or the covering provider during after hours 7P-7A, please log into the web site [email or web address] and access using universal [place] password for that web site. If you do not have the password, please call the hospital operator.  04/04/2021, 10:43 AM

## 2021-04-04 NOTE — Plan of Care (Signed)
Patient is currently resting in bed. C/o pain, PRN pain meds. Drsg intact. OOB ambulating to BR. VSS. No other complaints overnight. Call bell within reach.   Problem: Education: Goal: Knowledge of General Education information will improve Description: Including pain rating scale, medication(s)/side effects and non-pharmacologic comfort measures Outcome: Progressing   Problem: Health Behavior/Discharge Planning: Goal: Ability to manage health-related needs will improve Outcome: Progressing   Problem: Clinical Measurements: Goal: Ability to maintain clinical measurements within normal limits will improve Outcome: Progressing Goal: Respiratory complications will improve Outcome: Progressing   Problem: Activity: Goal: Risk for activity intolerance will decrease Outcome: Progressing   Problem: Pain Managment: Goal: General experience of comfort will improve Outcome: Progressing

## 2021-04-05 DIAGNOSIS — N179 Acute kidney failure, unspecified: Secondary | ICD-10-CM | POA: Diagnosis not present

## 2021-04-05 DIAGNOSIS — R6520 Severe sepsis without septic shock: Secondary | ICD-10-CM | POA: Diagnosis not present

## 2021-04-05 DIAGNOSIS — A419 Sepsis, unspecified organism: Secondary | ICD-10-CM | POA: Diagnosis not present

## 2021-04-05 LAB — GLUCOSE, CAPILLARY
Glucose-Capillary: 130 mg/dL — ABNORMAL HIGH (ref 70–99)
Glucose-Capillary: 108 mg/dL — ABNORMAL HIGH (ref 70–99)
Glucose-Capillary: 141 mg/dL — ABNORMAL HIGH (ref 70–99)
Glucose-Capillary: 163 mg/dL — ABNORMAL HIGH (ref 70–99)

## 2021-04-05 NOTE — Plan of Care (Signed)
VSS. OOB ambulating. C/o pain, given PRN pain meds. Drsg intact. Call bell within reach.   Problem: Education: Goal: Knowledge of General Education information will improve Description: Including pain rating scale, medication(s)/side effects and non-pharmacologic comfort measures Outcome: Progressing   Problem: Health Behavior/Discharge Planning: Goal: Ability to manage health-related needs will improve Outcome: Progressing   Problem: Clinical Measurements: Goal: Ability to maintain clinical measurements within normal limits will improve Outcome: Progressing Goal: Respiratory complications will improve Outcome: Progressing   Problem: Activity: Goal: Risk for activity intolerance will decrease Outcome: Progressing   Problem: Pain Managment: Goal: General experience of comfort will improve Outcome: Progressing

## 2021-04-05 NOTE — Progress Notes (Addendum)
PROGRESS NOTE        PATIENT DETAILS Name: Brian Malone Age: 70 y.o. Sex: male Date of Birth: December 27, 1950 Admit Date: 03/13/2021 Admitting Physician Brian Halt, MD WUJ:WJXBJ Brian Bob, MD  Brief Narrative: Patient is a 70 y.o. male with history of chronic pain syndrome, hepatitis C, HIV, HTN, HLD, DM-2-who had sustained a fall in his yard on 3/6-following which she was diagnosed with acute right sixth, seventh and eighth rib fractures-he was treated conservatively-he presented to the ED on 3/25 with sepsis-and was found to have right chest wall abscess along with osteomyelitis.  See below for further details.  Significant events: 3/25>> admit to [facility]-right chest wall abscess/osteomyelitis. 3/30>> ultrasound guided aspiration of chest wall hematoma/abscess 3/31>> chest wall debridement-placement of wound VAC by cardiothoracic surgery  Significant studies: 3/25>> CT chest : Increasing size of right chest wall collection-possible abscess. 3/29>> CT chest: Interval decrease in size of a by lobar right anterolateral chest wall fluid collection, lucency in the anterior aspect of the right sixth and seventh ribs-suspicious for osteomyelitis. 4/3>> CT chest: No PE, destructive changes in the right anterior sixth rib concerning for osteomyelitis.  Antimicrobial therapy: None  Microbiology data: 3/25>> blood culture: No growth 3/26>> right chest wall hematoma/abscess: MRSA 3/30>> right chest wall fluid: MRSA 3/31>> right chest wall abscess: MRSA  Procedures : 3/26>> ultrasound-guided placement of 2 percutaneous drains in the right chest wall fluid collection 3/30>> ultrasound guided aspiration of chest wall hematoma/abscess 3/31>> chest wall debridement-placement of wound VAC by cardiothoracic surgery  Consults: ID, cardiothoracic surgery  DVT Prophylaxis : heparin injection 5,000 Units Start: 03/19/21 0600 SCDs Start: 03/13/21  2321   Subjective: Walking in the room when I walked in-no major issues overnight.  Assessment/Plan: Sepsis due to right-sided chest wall abscess-multiple rib fractures-osteomyelitis of the sixth rib-fluid cultures positive for MRSA: Sepsis physiology has resolved-s/p debridement-wound VAC in place-ID recommending 3 weeks of daptomycin from 3/31-and then to transition to oral Zyvox until seen at outpatient ID clinic.  Pain currently controlled with transdermal fentanyl 25 mcg-and as needed oral oxycodone.  With dressing changes he receives IV Dilaudid.  AKI on CKD stage II: Creatinine close to baseline-AKI was likely hemodynamically mediated.  HTN: BP controlled-continue metoprolol.  HLD: Continue Zetia  DM-2 (A1c 8.3 on 3/26): CBG stable-continue SSI  Recent Labs    04/04/21 1605 04/04/21 1950 04/05/21 0611  GLUCAP 166* 173* 130*    HIV: Continue antiretrovirals  Hepatitis C:continue Mavyret for 4 more weeks from 3/17.  HCVRNA 3/17 not detected.  Follows up with a provider at [facility]  Opioid-induced constipation: Starting [employer].  Anxiety/depression: Stable-continue BuSpar/Klonopin.  Normocytic anemia: Due to acute illness-no indication of blood loss-follow.  Weekly.  GERD: Continue PPI  BPH: Continue Flomax  Nutrition Problem: Nutrition Problem: Severe Malnutrition Etiology: acute illness (rib fractures S/P surgeries and drain placement causing pain) Signs/Symptoms: severe muscle depletion,severe fat depletion Interventions: Ensure Enlive (each supplement provides 350kcal and 20 grams of protein),MVI,Prostat,Hormel Shake,Glucerna shake    Diet: Diet Order            Diet Carb Modified Fluid consistency: Thin; Room service appropriate? Yes  Diet effective now                  Code Status: Full code   Family Communication: None at bedside  Disposition Plan: Status is:  Inpatient  Remains inpatient appropriate because:Inpatient level of care  appropriate due to severity of illness   Dispo: The patient is from: Home              Anticipated d/c is to: Home              Patient currently is not medically stable to d/c.   Difficult to place patient No    Barriers to Discharge: Awaiting SNF bed for IV antimicrobial therapy  Antimicrobial agents: Anti-infectives (From admission, onward)   Start     Dose/Rate Route Frequency Ordered Stop   04/10/21 1000  linezolid (ZYVOX) tablet 600 mg        600 mg Oral Every 12 hours 03/27/21 1410 05/01/21 0959   04/03/21 1415  Glecaprevir-Pibrentasvir 100-40 MG TABS 3 tablet        3 tablet Oral Daily 04/03/21 1319 04/13/21 0959   03/27/21 0000  linezolid (ZYVOX) 600 MG tablet       Note to Pharmacy: Patient staying through next week for IV abx - Just working ahead.   600 mg Oral 2 times daily 03/27/21 0933 04/17/21 2359   03/23/21 2000  DAPTOmycin (CUBICIN) 700 mg in sodium chloride 0.9 % IVPB        8 mg/kg  85.2 kg 128 mL/hr over 30 Minutes Intravenous Every 24 hours 03/23/21 1133 04/09/21 2359   03/22/21 1200  vancomycin (VANCOREADY) IVPB 1250 mg/250 mL  Status:  Discontinued        1,250 mg 166.7 mL/hr over 90 Minutes Intravenous Every 24 hours 03/21/21 1319 03/23/21 1133   03/20/21 1000  Glecaprevir-Pibrentasivir 100-40 mg (Mavyret) tabs 3 tablets - patient's own supply  Status:  Discontinued        3 tablet Oral Daily 03/20/21 0733 04/03/21 0957   03/15/21 1200  vancomycin (VANCOREADY) IVPB 1500 mg/300 mL  Status:  Discontinued        1,500 mg 150 mL/hr over 120 Minutes Intravenous Every 24 hours 03/15/21 0732 03/21/21 1319   03/14/21 1200  vancomycin (VANCOREADY) IVPB 1250 mg/250 mL  Status:  Discontinued        1,250 mg 166.7 mL/hr over 90 Minutes Intravenous Every 24 hours 03/13/21 1359 03/15/21 0732   03/14/21 1023  valACYclovir (VALTREX) tablet 1,000 mg       Note to Pharmacy: Trig neuralgia flare     1,000 mg Oral 3 times daily PRN 03/14/21 1023     03/14/21 1000   abacavir-dolutegravir-lamiVUDine (TRIUMEQ) 600-50-300 MG per tablet 1 tablet        1 tablet Oral Daily 03/13/21 2320     03/14/21 0200  meropenem (MERREM) 1 g in sodium chloride 0.9 % 100 mL IVPB  Status:  Discontinued        1 g 200 mL/hr over 30 Minutes Intravenous Every 8 hours 03/13/21 2337 03/16/21 0719   03/14/21 0100  ceFEPIme (MAXIPIME) 2 g in sodium chloride 0.9 % 100 mL IVPB  Status:  Discontinued       Note to Pharmacy: Cefepime 2 g IV q12h for CrCl < 60 mL/min   2 g 200 mL/hr over 30 Minutes Intravenous Every 12 hours 03/13/21 2326 03/13/21 2331   03/13/21 2000  piperacillin-tazobactam (ZOSYN) IVPB 3.375 g  Status:  Discontinued        3.375 g 12.5 mL/hr over 240 Minutes Intravenous Every 8 hours 03/13/21 1402 03/13/21 2320   03/13/21 1430  clindamycin (CLEOCIN) IVPB 900 mg  900 mg 100 mL/hr over 30 Minutes Intravenous  Once 03/13/21 1418 03/13/21 1548   03/13/21 1230  vancomycin (VANCOCIN) IVPB 1000 mg/200 mL premix        1,000 mg 200 mL/hr over 60 Minutes Intravenous  Once 03/13/21 1229 03/13/21 1506   03/13/21 1230  piperacillin-tazobactam (ZOSYN) IVPB 3.375 g        3.375 g 100 mL/hr over 30 Minutes Intravenous  Once 03/13/21 1229 03/13/21 1400       Time spent: 15 minutes-Greater than 50% of this time was spent in counseling, explanation of diagnosis, planning of further management, and coordination of care.  MEDICATIONS: Scheduled Meds: . abacavir-dolutegravir-lamiVUDine  1 tablet Oral Daily  . busPIRone  30 mg Oral BID  . clonazepam  0.5 mg Oral QID  . ezetimibe  10 mg Oral Daily  . feeding supplement  237 mL Oral BID BM  . feeding supplement (GLUCERNA SHAKE)  237 mL Oral Q24H  . fentaNYL  1 patch Transdermal Q72H  . Glecaprevir-Pibrentasvir  3 tablet Oral Daily  . heparin injection (subcutaneous)  5,000 Units Subcutaneous Q8H  . insulin aspart  0-15 Units Subcutaneous TID AC & HS  . [START ON 04/10/2021] linezolid  600 mg Oral Q12H  . metoprolol  tartrate  25 mg Oral BID  . multivitamin with minerals  1 tablet Oral Daily  . naloxegol oxalate  12.5 mg Oral QAC breakfast  . pantoprazole  40 mg Oral QAC breakfast  . tamsulosin  0.4 mg Oral QHS  . tiZANidine  2 mg Oral QHS   Continuous Infusions: . sodium chloride 10 mL/hr at 03/31/21 1524  . DAPTOmycin (CUBICIN)  IV 700 mg (04/04/21 2133)  . lactated ringers 10 mL/hr at 03/25/21 1007   PRN Meds:.(feeding supplement) PROSource Plus, sodium chloride, acetaminophen **OR** [DISCONTINUED] acetaminophen, antiseptic oral rinse, bisacodyl, docusate sodium, feeding supplement, naLOXone (NARCAN)  injection, [DISCONTINUED] ondansetron **OR** ondansetron (ZOFRAN) IV, oxyCODONE-acetaminophen **AND** oxyCODONE, senna-docusate, valACYclovir   PHYSICAL EXAM: Vital signs: Vitals:   04/04/21 0614 04/04/21 1230 04/04/21 1948 04/05/21 0606  BP: 104/63 125/80 125/80 122/75  Pulse: 64 95 90 68  Resp: 16 19 16 18   Temp: 97.7 F (36.5 C) 98.4 F (36.9 C) 98.5 F (36.9 C) 97.6 F (36.4 C)  TempSrc: Oral  Oral Oral  SpO2: 100% 99% 99% 100%  Weight:      Height:       Filed Weights   03/19/21 1330 03/25/21 0944 04/04/21 0412  Weight: 85.2 kg 85.2 kg 86.2 kg   Body mass index is 25.77 kg/m.   Gen Exam:Alert awake-not in any distress HEENT:atraumatic, normocephalic Chest: B/L clear to auscultation anteriorly CVS:S1S2 regular Abdomen:soft non tender, non distended Extremities:no edema Neurology: Non focal Skin: no rash  I have personally reviewed following labs and imaging studies  LABORATORY DATA: CBC: Recent Labs  Lab 03/31/21 0633  WBC 5.2  HGB 9.8*  HCT 30.1*  MCV 98.4  PLT 035    Basic Metabolic Panel: Recent Labs  Lab 03/31/21 0633  NA 136  K 4.8  CL 97*  CO2 29  GLUCOSE 128*  BUN 33*  CREATININE 1.38*  CALCIUM 9.2    GFR: Estimated Creatinine Clearance: 57 mL/min (A) (by C-G formula based on SCr of 1.38 mg/dL (H)).  Liver Function Tests: No results for  input(s): AST, ALT, ALKPHOS, BILITOT, PROT, ALBUMIN in the last 168 hours. No results for input(s): LIPASE, AMYLASE in the last 168 hours. No results for input(s): AMMONIA in the  last 168 hours.  Coagulation Profile: No results for input(s): INR, PROTIME in the last 168 hours.  Cardiac Enzymes: Recent Labs  Lab 03/31/21 0633  CKTOTAL 40*    BNP (last 3 results) No results for input(s): PROBNP in the last 8760 hours.  Lipid Profile: No results for input(s): CHOL, HDL, LDLCALC, TRIG, CHOLHDL, LDLDIRECT in the last 72 hours.  Thyroid Function Tests: No results for input(s): TSH, T4TOTAL, FREET4, T3FREE, THYROIDAB in the last 72 hours.  Anemia Panel: No results for input(s): VITAMINB12, FOLATE, FERRITIN, TIBC, IRON, RETICCTPCT in the last 72 hours.  Urine analysis:    Component Value Date/Time   COLORURINE YELLOW 03/13/2021 2300   APPEARANCEUR CLEAR 03/13/2021 2300   LABSPEC 1.044 (H) 03/13/2021 2300   PHURINE 5.0 03/13/2021 2300   GLUCOSEU 50 (A) 03/13/2021 2300   HGBUR MODERATE (A) 03/13/2021 2300   BILIRUBINUR NEGATIVE 03/13/2021 2300   KETONESUR NEGATIVE 03/13/2021 2300   PROTEINUR 30 (A) 03/13/2021 2300   NITRITE NEGATIVE 03/13/2021 2300   LEUKOCYTESUR NEGATIVE 03/13/2021 2300    Sepsis Labs: Lactic Acid, Venous    Component Value Date/Time   LATICACIDVEN 1.8 03/14/2021 0038    MICROBIOLOGY: No results found for this or any previous visit (from the past 240 hour(s)).  RADIOLOGY STUDIES/RESULTS: No results found.   LOS: 23 days   Oren Binet, MD  [facility]    To contact the attending provider between 7A-7P or the covering provider during after hours 7P-7A, please log into the web site [email or web address] and access using universal [place] password for that web site. If you do not have the password, please call the hospital operator.  04/05/2021, 11:11 AM

## 2021-04-06 DIAGNOSIS — N179 Acute kidney failure, unspecified: Secondary | ICD-10-CM | POA: Diagnosis not present

## 2021-04-06 DIAGNOSIS — A419 Sepsis, unspecified organism: Secondary | ICD-10-CM | POA: Diagnosis not present

## 2021-04-06 DIAGNOSIS — R6520 Severe sepsis without septic shock: Secondary | ICD-10-CM | POA: Diagnosis not present

## 2021-04-06 LAB — GLUCOSE, CAPILLARY
Glucose-Capillary: 103 mg/dL — ABNORMAL HIGH (ref 70–99)
Glucose-Capillary: 106 mg/dL — ABNORMAL HIGH (ref 70–99)
Glucose-Capillary: 121 mg/dL — ABNORMAL HIGH (ref 70–99)
Glucose-Capillary: 169 mg/dL — ABNORMAL HIGH (ref 70–99)
Glucose-Capillary: 133 mg/dL — ABNORMAL HIGH (ref 70–99)

## 2021-04-06 MED ORDER — HYDROMORPHONE HCL 1 MG/ML IJ SOLN
1.0000 mg | Freq: Once | INTRAMUSCULAR | Status: AC
  Administered 2021-04-06: 1 mg via INTRAVENOUS
  Filled 2021-04-06: qty 1
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
## 2021-04-06 NOTE — TOC Progression Note (Signed)
Transition of Care [facility]) - Progression Note    Patient Details  Name: Brian Malone MRN: [id number] Date of Birth: [birth year]  [facility] CM/SW Contact  Loletha Grayer Beverely Pace, RN Phone Number: 04/06/2021, 11:15 AM  Clinical Narrative:   CM spoke with [place] Nurse concerning patient's vac need at discharge. As of right now we are waiting for surgeon to give order for vac needs. TOC Team will continue to monitor.     Expected Discharge Plan:  (TBD) Barriers to Discharge: Continued Medical Work up  Expected Discharge Plan and Services Expected Discharge Plan:  (TBD)   Discharge Planning Services: CM Consult   Living arrangements for the past 2 months: Single Family Home                                       Social Determinants of Health (SDOH) Interventions    Readmission Risk Interventions No flowsheet data found.

## 2021-04-06 NOTE — Progress Notes (Signed)
12 Days Post-Op Procedure(s) (LRB): APPLICATION OF WOUND VAC (Right) INCISION AND DRAINAGE OF CHEST WALL (Right) Subjective: C/o pain at [facility] site. Worried about pain meds at discharge  Objective: Vital signs in last 24 hours: Temp:  [97.7 F (36.5 C)-98.4 F (36.9 C)] 97.7 F (36.5 C) (04/18 0527) Pulse Rate:  [64-70] 64 (04/18 0527) Resp:  [16-18] 16 (04/18 0527) BP: (109-126)/(66-70) 109/66 (04/18 0527) SpO2:  [98 %-100 %] 99 % (04/18 0527)  Hemodynamic parameters for last 24 hours:    Intake/Output from previous day: 04/17 0701 - 04/18 0700 In: -  Out: 660 [Urine:660] Intake/Output this shift: No intake/output data recorded.  General appearance: alert, cooperative and no distress Wound: VAC in place  Lab Results: No results for input(s): WBC, HGB, HCT, PLT in the last 72 hours. BMET: No results for input(s): NA, K, CL, CO2, GLUCOSE, BUN, CREATININE, CALCIUM in the last 72 hours.  PT/INR: No results for input(s): LABPROT, INR in the last 72 hours. ABG No results found for: PHART, HCO3, TCO2, ACIDBASEDEF, O2SAT CBG (last 3)  Recent Labs    04/06/21 0559 04/06/21 0751 04/06/21 1210  GLUCAP 103* 106* 121*    Assessment/Plan: S/P Procedure(s) (LRB): APPLICATION OF WOUND VAC (Right) INCISION AND DRAINAGE OF CHEST WALL (Right) -Chest wall abscess, s/p I&D and VAC placement VAC changed this AM Continue to change q M and F Will need an appointment with Dr. Kipp Brood about 1-2 weeks post dc    LOS: 24 days    Melrose Nakayama 04/06/2021

## 2021-04-06 NOTE — Consult Note (Signed)
[place] Nurse wound follow up Patient receiving care in 5W6. Wound type: right chest surgical wound Measurement: deferred until Friday Wound bed: Increased granulation tissue and cellular matrix in wound bed Drainage (amount, consistency, odor) serosanginous in canister Periwound: intact Dressing procedure/placement/frequency: 1 pc of Mepitel and 1 pc of black foam removed from wound bed. One of each placed into wound bed. Drape applied, immediate seal obtained. Patient was premedicated. Patient tolerated really well. I have requested the supplies be ordered by the [place] for Friday. Val Riles, RN, MSN, CWOCN, CNS-BC, pager [phone]

## 2021-04-06 NOTE — Plan of Care (Signed)
°  Problem: Education: °Goal: Knowledge of General Education information will improve °Description: Including pain rating scale, medication(s)/side effects and non-pharmacologic comfort measures °Outcome: Progressing °  °Problem: Health Behavior/Discharge Planning: °Goal: Ability to manage health-related needs will improve °Outcome: Progressing °  °Problem: Clinical Measurements: °Goal: Ability to maintain clinical measurements within normal limits will improve °Outcome: Progressing °Goal: Respiratory complications will improve °Outcome: Progressing °  °Problem: Activity: °Goal: Risk for activity intolerance will decrease °Outcome: Progressing °  °Problem: Pain Managment: °Goal: General experience of comfort will improve °Outcome: Progressing °  °

## 2021-04-06 NOTE — Progress Notes (Signed)
Patients old fentanyl patch removed from left arm and placed in sharps container, witnessed by Liana,RN. New fentanyl patch placed on right arm.

## 2021-04-06 NOTE — Progress Notes (Signed)
PROGRESS NOTE        PATIENT DETAILS Name: Brian Malone Age: 70 y.o. Sex: male Date of Birth: 1951-08-15 Admit Date: 03/13/2021 Admitting Physician Lequita Halt, MD CXK:GYJEH [name], MD  Brief Narrative: Patient is a 70 y.o. male with history of chronic pain syndrome, hepatitis C, HIV, HTN, HLD, DM-2-who had sustained a fall in his yard on 3/6-following which she was diagnosed with acute right sixth, seventh and eighth rib fractures-he was treated conservatively-he presented to the ED on 3/25 with sepsis-and was found to have right chest wall abscess along with osteomyelitis.  See below for further details.  Significant events: 3/25>> admit to [facility]-right chest wall abscess/osteomyelitis. 3/30>> ultrasound guided aspiration of chest wall hematoma/abscess 3/31>> chest wall debridement-placement of wound VAC by cardiothoracic surgery  Significant studies: 3/25>> CT chest : Increasing size of right chest wall collection-possible abscess. 3/29>> CT chest: Interval decrease in size of a by lobar right anterolateral chest wall fluid collection, lucency in the anterior aspect of the right sixth and seventh ribs-suspicious for osteomyelitis. 4/3>> CT chest: No PE, destructive changes in the right anterior sixth rib concerning for osteomyelitis.  Antimicrobial therapy: None  Microbiology data: 3/25>> blood culture: No growth 3/26>> right chest wall hematoma/abscess: MRSA 3/30>> right chest wall fluid: MRSA 3/31>> right chest wall abscess: MRSA  Procedures : 3/26>> ultrasound-guided placement of 2 percutaneous drains in the right chest wall fluid collection 3/30>> ultrasound guided aspiration of chest wall hematoma/abscess 3/31>> chest wall debridement-placement of wound VAC by cardiothoracic surgery  Consults: ID, cardiothoracic surgery  DVT Prophylaxis : heparin injection 5,000 Units Start: 03/19/21 0600 SCDs Start: 03/13/21  2321   Subjective: Just had his wound VAC changed-claims he had to suffer through the procedure.  Has numerous complaints this morning regarding plan discharge this Friday-asking about "a ride home"-asking about follow-up appointments etc. I have asked him to call his PCPs office and pain management's office today so that he could get appointment set up for the near future.  He then proceeded to tell me that he does not call and make appointments.  Assessment/Plan: Sepsis due to right-sided chest wall abscess-multiple rib fractures-osteomyelitis of the sixth rib-fluid cultures positive for MRSA: Sepsis physiology has resolved-s/p debridement-wound VAC in place-ID recommending 3 weeks of daptomycin from 3/31-and then to transition to oral Zyvox until seen at outpatient ID clinic.  Pain currently controlled with transdermal fentanyl 25 mcg-and as needed oral oxycodone.  With dressing changes he receives IV Dilaudid.  AKI on CKD stage II: Creatinine close to baseline-AKI was likely hemodynamically mediated.  HTN: BP controlled-continue metoprolol.  HLD: Continue Zetia  DM-2 (A1c 8.3 on 3/26): CBG stable-continue SSI  Recent Labs    04/05/21 2113 04/06/21 0559 04/06/21 0751  GLUCAP 163* 103* 106*    HIV: Continue antiretrovirals  Hepatitis C:continue Mavyret for 4 more weeks from 3/17.  HCVRNA 3/17 not detected.  Follows up with a provider at [facility]  Opioid-induced constipation: Continue Movantik.  Anxiety/depression: Stable-continue BuSpar/Klonopin.  Normocytic anemia: Due to acute illness-no indication of blood loss-follow.  GERD: Continue PPI  BPH: Continue Flomax  Nutrition Problem: Nutrition Problem: Severe Malnutrition Etiology: acute illness (rib fractures S/P surgeries and drain placement causing pain) Signs/Symptoms: severe muscle depletion,severe fat depletion Interventions: Ensure Enlive (each supplement provides 350kcal and 20 grams of  protein),MVI,Prostat,Hormel Shake,Glucerna shake  Diet: Diet Order            Diet Carb Modified Fluid consistency: Thin; Room service appropriate? Yes  Diet effective now                  Code Status: Full code   Family Communication: None at bedside  Disposition Plan: Status is: Inpatient  Remains inpatient appropriate because:Inpatient level of care appropriate due to severity of illness   Dispo: The patient is from: Home              Anticipated d/c is to: Home              Patient currently is not medically stable to d/c.   Difficult to place patient No    Barriers to Discharge: On IV daptomycin-no SNF bed-plan discharge this coming Friday.  Antimicrobial agents: Anti-infectives (From admission, onward)   Start     Dose/Rate Route Frequency Ordered Stop   04/10/21 1000  linezolid (ZYVOX) tablet 600 mg        600 mg Oral Every 12 hours 03/27/21 1410 05/01/21 0959   04/03/21 1415  Glecaprevir-Pibrentasvir 100-40 MG TABS 3 tablet        3 tablet Oral Daily 04/03/21 1319 04/13/21 0959   03/27/21 0000  linezolid (ZYVOX) 600 MG tablet       Note to Pharmacy: Patient staying through next week for IV abx - Just working ahead.   600 mg Oral 2 times daily 03/27/21 0933 04/17/21 2359   03/23/21 2000  DAPTOmycin (CUBICIN) 700 mg in sodium chloride 0.9 % IVPB        8 mg/kg  85.2 kg 128 mL/hr over 30 Minutes Intravenous Every 24 hours 03/23/21 1133 04/09/21 2359   03/22/21 1200  vancomycin (VANCOREADY) IVPB 1250 mg/250 mL  Status:  Discontinued        1,250 mg 166.7 mL/hr over 90 Minutes Intravenous Every 24 hours 03/21/21 1319 03/23/21 1133   03/20/21 1000  Glecaprevir-Pibrentasivir 100-40 mg (Mavyret) tabs 3 tablets - patient's own supply  Status:  Discontinued        3 tablet Oral Daily 03/20/21 0733 04/03/21 0957   03/15/21 1200  vancomycin (VANCOREADY) IVPB 1500 mg/300 mL  Status:  Discontinued        1,500 mg 150 mL/hr over 120 Minutes Intravenous Every 24  hours 03/15/21 0732 03/21/21 1319   03/14/21 1200  vancomycin (VANCOREADY) IVPB 1250 mg/250 mL  Status:  Discontinued        1,250 mg 166.7 mL/hr over 90 Minutes Intravenous Every 24 hours 03/13/21 1359 03/15/21 0732   03/14/21 1023  valACYclovir (VALTREX) tablet 1,000 mg       Note to Pharmacy: Trig neuralgia flare     1,000 mg Oral 3 times daily PRN 03/14/21 1023     03/14/21 1000  abacavir-dolutegravir-lamiVUDine (TRIUMEQ) 600-50-300 MG per tablet 1 tablet        1 tablet Oral Daily 03/13/21 2320     03/14/21 0200  meropenem (MERREM) 1 g in sodium chloride 0.9 % 100 mL IVPB  Status:  Discontinued        1 g 200 mL/hr over 30 Minutes Intravenous Every 8 hours 03/13/21 2337 03/16/21 0719   03/14/21 0100  ceFEPIme (MAXIPIME) 2 g in sodium chloride 0.9 % 100 mL IVPB  Status:  Discontinued       Note to Pharmacy: Cefepime 2 g IV q12h for CrCl < 60 mL/min   2 g 200  mL/hr over 30 Minutes Intravenous Every 12 hours 03/13/21 2326 03/13/21 2331   03/13/21 2000  piperacillin-tazobactam (ZOSYN) IVPB 3.375 g  Status:  Discontinued        3.375 g 12.5 mL/hr over 240 Minutes Intravenous Every 8 hours 03/13/21 1402 03/13/21 2320   03/13/21 1430  clindamycin (CLEOCIN) IVPB 900 mg        900 mg 100 mL/hr over 30 Minutes Intravenous  Once 03/13/21 1418 03/13/21 1548   03/13/21 1230  vancomycin (VANCOCIN) IVPB 1000 mg/200 mL premix        1,000 mg 200 mL/hr over 60 Minutes Intravenous  Once 03/13/21 1229 03/13/21 1506   03/13/21 1230  piperacillin-tazobactam (ZOSYN) IVPB 3.375 g        3.375 g 100 mL/hr over 30 Minutes Intravenous  Once 03/13/21 1229 03/13/21 1400       Time spent: 15 minutes-Greater than 50% of this time was spent in counseling, explanation of diagnosis, planning of further management, and coordination of care.  MEDICATIONS: Scheduled Meds: . abacavir-dolutegravir-lamiVUDine  1 tablet Oral Daily  . busPIRone  30 mg Oral BID  . clonazepam  0.5 mg Oral QID  . ezetimibe  10 mg  Oral Daily  . feeding supplement  237 mL Oral BID BM  . feeding supplement (GLUCERNA SHAKE)  237 mL Oral Q24H  . fentaNYL  1 patch Transdermal Q72H  . Glecaprevir-Pibrentasvir  3 tablet Oral Daily  . heparin injection (subcutaneous)  5,000 Units Subcutaneous Q8H  . insulin aspart  0-15 Units Subcutaneous TID AC & HS  . [START ON 04/10/2021] linezolid  600 mg Oral Q12H  . metoprolol tartrate  25 mg Oral BID  . multivitamin with minerals  1 tablet Oral Daily  . naloxegol oxalate  12.5 mg Oral QAC breakfast  . pantoprazole  40 mg Oral QAC breakfast  . tamsulosin  0.4 mg Oral QHS  . tiZANidine  2 mg Oral QHS   Continuous Infusions: . sodium chloride 10 mL/hr at 03/31/21 1524  . DAPTOmycin (CUBICIN)  IV 700 mg (04/05/21 1943)  . lactated ringers 10 mL/hr at 03/25/21 1007   PRN Meds:.(feeding supplement) PROSource Plus, sodium chloride, acetaminophen **OR** [DISCONTINUED] acetaminophen, antiseptic oral rinse, bisacodyl, docusate sodium, feeding supplement, naLOXone (NARCAN)  injection, [DISCONTINUED] ondansetron **OR** ondansetron (ZOFRAN) IV, oxyCODONE-acetaminophen **AND** oxyCODONE, senna-docusate, valACYclovir   PHYSICAL EXAM: Vital signs: Vitals:   04/05/21 0606 04/05/21 1510 04/05/21 2030 04/06/21 0527  BP: 122/75 119/67 126/70 109/66  Pulse: 68 70 66 64  Resp: 18 18 16 16   Temp: 97.6 F (36.4 C) 98 F (36.7 C) 98.4 F (36.9 C) 97.7 F (36.5 C)  TempSrc: Oral Axillary Oral Oral  SpO2: 100% 98% 100% 99%  Weight:      Height:       Filed Weights   03/19/21 1330 03/25/21 0944 04/04/21 0412  Weight: 85.2 kg 85.2 kg 86.2 kg   Body mass index is 25.77 kg/m.   Gen Exam:Alert awake-not in any distress HEENT:atraumatic, normocephalic Chest: B/L clear to auscultation anteriorly CVS:S1S2 regular Abdomen:soft non tender, non distended Extremities:no edema Neurology: Non focal Skin: no rash  I have personally reviewed following labs and imaging studies  LABORATORY  DATA: CBC: Recent Labs  Lab 03/31/21 0633  WBC 5.2  HGB 9.8*  HCT 30.1*  MCV 98.4  PLT 440    Basic Metabolic Panel: Recent Labs  Lab 03/31/21 0633  NA 136  K 4.8  CL 97*  CO2 29  GLUCOSE 128*  BUN 33*  CREATININE 1.38*  CALCIUM 9.2    GFR: Estimated Creatinine Clearance: 57 mL/min (A) (by C-G formula based on SCr of 1.38 mg/dL (H)).  Liver Function Tests: No results for input(s): AST, ALT, ALKPHOS, BILITOT, PROT, ALBUMIN in the last 168 hours. No results for input(s): LIPASE, AMYLASE in the last 168 hours. No results for input(s): AMMONIA in the last 168 hours.  Coagulation Profile: No results for input(s): INR, PROTIME in the last 168 hours.  Cardiac Enzymes: Recent Labs  Lab 03/31/21 0633  CKTOTAL 40*    BNP (last 3 results) No results for input(s): PROBNP in the last 8760 hours.  Lipid Profile: No results for input(s): CHOL, HDL, LDLCALC, TRIG, CHOLHDL, LDLDIRECT in the last 72 hours.  Thyroid Function Tests: No results for input(s): TSH, T4TOTAL, FREET4, T3FREE, THYROIDAB in the last 72 hours.  Anemia Panel: No results for input(s): VITAMINB12, FOLATE, FERRITIN, TIBC, IRON, RETICCTPCT in the last 72 hours.  Urine analysis:    Component Value Date/Time   COLORURINE YELLOW 03/13/2021 2300   APPEARANCEUR CLEAR 03/13/2021 2300   LABSPEC 1.044 (H) 03/13/2021 2300   PHURINE 5.0 03/13/2021 2300   GLUCOSEU 50 (A) 03/13/2021 2300   HGBUR MODERATE (A) 03/13/2021 2300   BILIRUBINUR NEGATIVE 03/13/2021 2300   KETONESUR NEGATIVE 03/13/2021 2300   PROTEINUR 30 (A) 03/13/2021 2300   NITRITE NEGATIVE 03/13/2021 2300   LEUKOCYTESUR NEGATIVE 03/13/2021 2300    Sepsis Labs: Lactic Acid, Venous    Component Value Date/Time   LATICACIDVEN 1.8 03/14/2021 0038    MICROBIOLOGY: No results found for this or any previous visit (from the past 240 hour(s)).  RADIOLOGY STUDIES/RESULTS: No results found.   LOS: 24 days   Oren Binet, MD  [facility]    To contact the attending provider between 7A-7P or the covering provider during after hours 7P-7A, please log into the web site [email or web address] and access using universal [place] password for that web site. If you do not have the password, please call the hospital operator.  04/06/2021, 11:02 AM

## 2021-04-07 DIAGNOSIS — A419 Sepsis, unspecified organism: Secondary | ICD-10-CM | POA: Diagnosis not present

## 2021-04-07 DIAGNOSIS — N179 Acute kidney failure, unspecified: Secondary | ICD-10-CM | POA: Diagnosis not present

## 2021-04-07 DIAGNOSIS — R6520 Severe sepsis without septic shock: Secondary | ICD-10-CM | POA: Diagnosis not present

## 2021-04-07 LAB — CBC
HCT: 29.6 % — ABNORMAL LOW (ref 39.0–52.0)
Hemoglobin: 9.6 g/dL — ABNORMAL LOW (ref 13.0–17.0)
MCH: 32.1 pg (ref 26.0–34.0)
MCHC: 32.4 g/dL (ref 30.0–36.0)
MCV: 99 fL (ref 80.0–100.0)
Platelets: 218 10*3/uL (ref 150–400)
RBC: 2.99 MIL/uL — ABNORMAL LOW (ref 4.22–5.81)
RDW: 14.6 % (ref 11.5–15.5)
WBC: 5.6 10*3/uL (ref 4.0–10.5)
nRBC: 0 % (ref 0.0–0.2)

## 2021-04-07 LAB — GLUCOSE, CAPILLARY
Glucose-Capillary: 121 mg/dL — ABNORMAL HIGH (ref 70–99)
Glucose-Capillary: 115 mg/dL — ABNORMAL HIGH (ref 70–99)
Glucose-Capillary: 162 mg/dL — ABNORMAL HIGH (ref 70–99)
Glucose-Capillary: 158 mg/dL — ABNORMAL HIGH (ref 70–99)
Glucose-Capillary: 130 mg/dL — ABNORMAL HIGH (ref 70–99)

## 2021-04-07 LAB — BASIC METABOLIC PANEL
Anion gap: 8 (ref 5–15)
BUN: 29 mg/dL — ABNORMAL HIGH (ref 8–23)
CO2: 29 mmol/L (ref 22–32)
Calcium: 9.1 mg/dL (ref 8.9–10.3)
Chloride: 100 mmol/L (ref 98–111)
Creatinine, Ser: 1.36 mg/dL — ABNORMAL HIGH (ref 0.61–1.24)
GFR, Estimated: 57 mL/min — ABNORMAL LOW (ref >60)
Glucose, Bld: 140 mg/dL — ABNORMAL HIGH (ref 70–99)
Potassium: 4.6 mmol/L (ref 3.5–5.1)
Sodium: 137 mmol/L (ref 135–145)

## 2021-04-07 LAB — CK: Total CK: 42 U/L — ABNORMAL LOW (ref 49–397)

## 2021-04-07 NOTE — Progress Notes (Addendum)
PROGRESS NOTE        PATIENT DETAILS Name: Brian Malone Age: 70 y.o. Sex: male Date of Birth: 02/02/1951 Admit Date: 03/13/2021 Admitting Physician Lequita Halt, MD [facility] [name], MD  Brief Narrative: Patient is a 70 y.o. male with history of chronic pain syndrome, hepatitis C, HIV, HTN, HLD, DM-2-who had sustained a fall in his yard on 3/6-following which she was diagnosed with acute right sixth, seventh and eighth rib fractures-he was treated conservatively-he presented to the ED on 3/25 with sepsis-and was found to have right chest wall abscess along with osteomyelitis.  Evaluated by CT surgery-infectious disease-underwent debridement-with wound VAC in place-ID recommending IV daptomycin through 4/22-until see below for further details.  Significant events: 3/25>> admit to [facility]-right chest wall abscess/osteomyelitis. 3/30>> ultrasound guided aspiration of chest wall hematoma/abscess 3/31>> chest wall debridement-placement of wound VAC by cardiothoracic surgery  Significant studies: 3/25>> CT chest : Increasing size of right chest wall collection-possible abscess. 3/29>> CT chest: Interval decrease in size of a by lobar right anterolateral chest wall fluid collection, lucency in the anterior aspect of the right sixth and seventh ribs-suspicious for osteomyelitis. 4/3>> CT chest: No PE, destructive changes in the right anterior sixth rib concerning for osteomyelitis.  Antimicrobial therapy: None  Microbiology data: 3/25>> blood culture: No growth 3/26>> right chest wall hematoma/abscess: MRSA 3/30>> right chest wall fluid: MRSA 3/31>> right chest wall abscess: MRSA  Procedures : 3/26>> ultrasound-guided placement of 2 percutaneous drains in the right chest wall fluid collection 3/30>> ultrasound guided aspiration of chest wall hematoma/abscess 3/31>> chest wall debridement-placement of wound VAC by cardiothoracic surgery  Consults: ID,  cardiothoracic surgery  DVT Prophylaxis : heparin injection 5,000 Units Start: 03/19/21 0600 SCDs Start: 03/13/21 2321   Subjective: Argumentative this morning-because I told him that he had to be proactive and try and make appointments with his PCP and his pain management MD-yesterday he proceeded to tell me he does not make appointments-and was waiting for someone from his PCPs office to call him back.  Still upset that discharge arrangements have not been discussed with him (I have been telling him for almost a week that he will likely be discharged on Friday-in that case management/social work will get in touch with him when it is closer to discharge).  He is aware that he has a follow-up appointment with cardiothoracic surgery on 4/29-and then with ID sometime in early May.  After this MD tried to explain that social work/case management will help him with the discharge process-he then proceeded to tell me that he had to undergo this prolonged hospitalization-chest wall abscess-osteomyelitis etc due to physicians/nurses with my attitude.  In any event-he appeared comfortable-did not verbalize any complaints-he then asked for patient care representative/advocate.  I have reached out to department director of [facility] to see if we can get patient care advocate to see him.  Spoke with RN-no major issues overnight.   Assessment/Plan: Sepsis due to right-sided chest wall abscess-multiple rib fractures-osteomyelitis of the sixth rib-fluid cultures positive for MRSA: Sepsis physiology has resolved-s/p debridement-wound VAC in place-ID recommending 3 weeks of daptomycin from 3/31-and then to transition to oral Zyvox until seen at outpatient ID clinic.  Pain currently controlled with transdermal fentanyl 25 mcg-and as needed oral oxycodone.   AKI on CKD stage II: Creatinine close to baseline-AKI was likely hemodynamically mediated.  HTN: BP controlled-continue metoprolol.  HLD: Continue Zetia  DM-2  (A1c 8.3 on 3/26): CBG stable-continue SSI  Recent Labs    04/06/21 2125 04/07/21 0554 04/07/21 0859  GLUCAP 133* 121* 115*    HIV: Continue antiretrovirals  Hepatitis C:continue Mavyret for 4 more weeks from 3/17.  HCVRNA 3/17 not detected.  Follows up with a provider at [facility]  Opioid-induced constipation: Continue Movantik.  Chronic pain syndrome: Pain appears well controlled with current regimen-reviewed prior notes-please see documentation by Dr. Candiss Norse on 4/4 regarding narcotic seeking behavior.  Patient follows with pain management MD-he is aware that he will be provided narcotics for a short duration on discharge-but to continue long-term narcotics-he will need to make an appointment with his pain management MD.  This MD has been asking patient to ensure that he start making appointments with his pain management MD and PCP while he is in the hospital.  Anxiety/depression: Stable-continue BuSpar/Klonopin.  Normocytic anemia: Due to acute illness-no indication of blood loss-follow.  GERD: Continue PPI  BPH: Continue Flomax  Nutrition Problem: Nutrition Problem: Severe Malnutrition Etiology: acute illness (rib fractures S/P surgeries and drain placement causing pain) Signs/Symptoms: severe muscle depletion,severe fat depletion Interventions: Ensure Enlive (each supplement provides 350kcal and 20 grams of protein),MVI,Prostat,Hormel Shake,Glucerna shake   Diet: Diet Order            Diet Carb Modified Fluid consistency: Thin; Room service appropriate? Yes  Diet effective now                  Code Status: Full code   Family Communication: None at bedside  Disposition Plan: Status is: Inpatient  Remains inpatient appropriate because:Inpatient level of care appropriate due to severity of illness   Dispo: The patient is from: Home              Anticipated d/c is to: Home              Patient currently is not medically stable to d/c.   Difficult to place  patient No    Barriers to Discharge: On IV daptomycin-no SNF bed-plan discharge this coming Friday-when he can be switched to oral Zyvox.  Antimicrobial agents: Anti-infectives (From admission, onward)   Start     Dose/Rate Route Frequency Ordered Stop   04/10/21 1000  linezolid (ZYVOX) tablet 600 mg        600 mg Oral Every 12 hours 03/27/21 1410 05/01/21 0959   04/03/21 1415  Glecaprevir-Pibrentasvir 100-40 MG TABS 3 tablet        3 tablet Oral Daily 04/03/21 1319 04/13/21 0959   03/27/21 0000  linezolid (ZYVOX) 600 MG tablet       Note to Pharmacy: Patient staying through next week for IV abx - Just working ahead.   600 mg Oral 2 times daily 03/27/21 0933 04/17/21 2359   03/23/21 2000  DAPTOmycin (CUBICIN) 700 mg in sodium chloride 0.9 % IVPB        8 mg/kg  85.2 kg 128 mL/hr over 30 Minutes Intravenous Every 24 hours 03/23/21 1133 04/09/21 2359   03/22/21 1200  vancomycin (VANCOREADY) IVPB 1250 mg/250 mL  Status:  Discontinued        1,250 mg 166.7 mL/hr over 90 Minutes Intravenous Every 24 hours 03/21/21 1319 03/23/21 1133   03/20/21 1000  Glecaprevir-Pibrentasivir 100-40 mg (Mavyret) tabs 3 tablets - patient's own supply  Status:  Discontinued        3 tablet Oral Daily 03/20/21 0733 04/03/21  0957   03/15/21 1200  vancomycin (VANCOREADY) IVPB 1500 mg/300 mL  Status:  Discontinued        1,500 mg 150 mL/hr over 120 Minutes Intravenous Every 24 hours 03/15/21 0732 03/21/21 1319   03/14/21 1200  vancomycin (VANCOREADY) IVPB 1250 mg/250 mL  Status:  Discontinued        1,250 mg 166.7 mL/hr over 90 Minutes Intravenous Every 24 hours 03/13/21 1359 03/15/21 0732   03/14/21 1023  valACYclovir (VALTREX) tablet 1,000 mg       Note to Pharmacy: Trig neuralgia flare     1,000 mg Oral 3 times daily PRN 03/14/21 1023     03/14/21 1000  abacavir-dolutegravir-lamiVUDine (TRIUMEQ) 600-50-300 MG per tablet 1 tablet        1 tablet Oral Daily 03/13/21 2320     03/14/21 0200  meropenem (MERREM)  1 g in sodium chloride 0.9 % 100 mL IVPB  Status:  Discontinued        1 g 200 mL/hr over 30 Minutes Intravenous Every 8 hours 03/13/21 2337 03/16/21 0719   03/14/21 0100  ceFEPIme (MAXIPIME) 2 g in sodium chloride 0.9 % 100 mL IVPB  Status:  Discontinued       Note to Pharmacy: Cefepime 2 g IV q12h for CrCl < 60 mL/min   2 g 200 mL/hr over 30 Minutes Intravenous Every 12 hours 03/13/21 2326 03/13/21 2331   03/13/21 2000  piperacillin-tazobactam (ZOSYN) IVPB 3.375 g  Status:  Discontinued        3.375 g 12.5 mL/hr over 240 Minutes Intravenous Every 8 hours 03/13/21 1402 03/13/21 2320   03/13/21 1430  clindamycin (CLEOCIN) IVPB 900 mg        900 mg 100 mL/hr over 30 Minutes Intravenous  Once 03/13/21 1418 03/13/21 1548   03/13/21 1230  vancomycin (VANCOCIN) IVPB 1000 mg/200 mL premix        1,000 mg 200 mL/hr over 60 Minutes Intravenous  Once 03/13/21 1229 03/13/21 1506   03/13/21 1230  piperacillin-tazobactam (ZOSYN) IVPB 3.375 g        3.375 g 100 mL/hr over 30 Minutes Intravenous  Once 03/13/21 1229 03/13/21 1400       Time spent: 15 minutes-Greater than 50% of this time was spent in counseling, explanation of diagnosis, planning of further management, and coordination of care.  MEDICATIONS: Scheduled Meds: . abacavir-dolutegravir-lamiVUDine  1 tablet Oral Daily  . busPIRone  30 mg Oral BID  . clonazepam  0.5 mg Oral QID  . ezetimibe  10 mg Oral Daily  . feeding supplement  237 mL Oral BID BM  . feeding supplement (GLUCERNA SHAKE)  237 mL Oral Q24H  . fentaNYL  1 patch Transdermal Q72H  . Glecaprevir-Pibrentasvir  3 tablet Oral Daily  . heparin injection (subcutaneous)  5,000 Units Subcutaneous Q8H  . insulin aspart  0-15 Units Subcutaneous TID AC & HS  . [START ON 04/10/2021] linezolid  600 mg Oral Q12H  . metoprolol tartrate  25 mg Oral BID  . multivitamin with minerals  1 tablet Oral Daily  . naloxegol oxalate  12.5 mg Oral QAC breakfast  . pantoprazole  40 mg Oral QAC  breakfast  . tamsulosin  0.4 mg Oral QHS  . tiZANidine  2 mg Oral QHS   Continuous Infusions: . sodium chloride 10 mL/hr at 03/31/21 1524  . DAPTOmycin (CUBICIN)  IV 700 mg (04/06/21 1937)  . lactated ringers 10 mL/hr at 03/25/21 1007   PRN Meds:.(feeding supplement) PROSource Plus,  sodium chloride, acetaminophen **OR** [DISCONTINUED] acetaminophen, antiseptic oral rinse, bisacodyl, docusate sodium, feeding supplement, naLOXone (NARCAN)  injection, [DISCONTINUED] ondansetron **OR** ondansetron (ZOFRAN) IV, oxyCODONE-acetaminophen **AND** oxyCODONE, senna-docusate, valACYclovir   PHYSICAL EXAM: Vital signs: Vitals:   04/06/21 0527 04/06/21 1450 04/06/21 2122 04/07/21 0526  BP: 109/66 114/75 117/78 118/79  Pulse: 64 72 73 64  Resp: 16 16 15 16   Temp: 97.7 F (36.5 C) 98.1 F (36.7 C) 98 F (36.7 C) 98.1 F (36.7 C)  TempSrc: Oral Oral Axillary Axillary  SpO2: 99% 100% 100% 97%  Weight:      Height:       Filed Weights   03/19/21 1330 03/25/21 0944 04/04/21 0412  Weight: 85.2 kg 85.2 kg 86.2 kg   Body mass index is 25.77 kg/m.   Gen Exam:Alert awake-not in any distress   I have personally reviewed following labs and imaging studies  LABORATORY DATA: CBC: Recent Labs  Lab 04/07/21 0039  WBC 5.6  HGB 9.6*  HCT 29.6*  MCV 99.0  PLT 527    Basic Metabolic Panel: Recent Labs  Lab 04/07/21 0039  NA 137  K 4.6  CL 100  CO2 29  GLUCOSE 140*  BUN 29*  CREATININE 1.36*  CALCIUM 9.1    GFR: Estimated Creatinine Clearance: 57.9 mL/min (A) (by C-G formula based on SCr of 1.36 mg/dL (H)).  Liver Function Tests: No results for input(s): AST, ALT, ALKPHOS, BILITOT, PROT, ALBUMIN in the last 168 hours. No results for input(s): LIPASE, AMYLASE in the last 168 hours. No results for input(s): AMMONIA in the last 168 hours.  Coagulation Profile: No results for input(s): INR, PROTIME in the last 168 hours.  Cardiac Enzymes: Recent Labs  Lab 04/07/21 0039   CKTOTAL 42*    BNP (last 3 results) No results for input(s): PROBNP in the last 8760 hours.  Lipid Profile: No results for input(s): CHOL, HDL, LDLCALC, TRIG, CHOLHDL, LDLDIRECT in the last 72 hours.  Thyroid Function Tests: No results for input(s): TSH, T4TOTAL, FREET4, T3FREE, THYROIDAB in the last 72 hours.  Anemia Panel: No results for input(s): VITAMINB12, FOLATE, FERRITIN, TIBC, IRON, RETICCTPCT in the last 72 hours.  Urine analysis:    Component Value Date/Time   COLORURINE YELLOW 03/13/2021 2300   APPEARANCEUR CLEAR 03/13/2021 2300   LABSPEC 1.044 (H) 03/13/2021 2300   PHURINE 5.0 03/13/2021 2300   GLUCOSEU 50 (A) 03/13/2021 2300   HGBUR MODERATE (A) 03/13/2021 2300   BILIRUBINUR NEGATIVE 03/13/2021 2300   KETONESUR NEGATIVE 03/13/2021 2300   PROTEINUR 30 (A) 03/13/2021 2300   NITRITE NEGATIVE 03/13/2021 2300   LEUKOCYTESUR NEGATIVE 03/13/2021 2300    Sepsis Labs: Lactic Acid, Venous    Component Value Date/Time   LATICACIDVEN 1.8 03/14/2021 0038    MICROBIOLOGY: No results found for this or any previous visit (from the past 240 hour(s)).  RADIOLOGY STUDIES/RESULTS: No results found.   LOS: 25 days   Oren Binet, MD  [facility]    To contact the attending provider between 7A-7P or the covering provider during after hours 7P-7A, please log into the web site [email or web address] and access using universal [place] password for that web site. If you do not have the password, please call the hospital operator.  04/07/2021, 10:18 AM

## 2021-04-07 NOTE — Progress Notes (Addendum)
Nutrition Follow-up  DOCUMENTATION CODES:   Severe malnutrition in context of acute illness/injury  INTERVENTION:    Glucerna Shake po once daily, each supplement provides 220 kcal and 10 grams of protein  Ensure Enlive po BID and prn, each supplement provides 350 kcal and 20 grams of protein  Prosource Plus 30 ml PO prn, each packet provides 100 kcal and 15 gm protein  Vital Cuisine Shake TID with meals, each supplement provides 520 kcal and 22 grams of protein  MVI with minerals daily  Encourage POs  NUTRITION DIAGNOSIS:   Severe Malnutrition related to acute illness (rib fractures S/P surgeries and drain placement causing pain) as evidenced by severe muscle depletion,severe fat depletion.  Ongoing  GOAL:   Patient will meet greater than or equal to 90% of their needs  Progressing  MONITOR:   PO intake,Supplement acceptance,Labs  REASON FOR ASSESSMENT:   Malnutrition Screening Tool    ASSESSMENT:   70 yo male admitted with chest wall abscess formation and cellulitis. Recent R side rib cage fracture S/P fall 02/22/21. PMH includes hepatitis C, HTN, HLD, DM-2, chronic pain syndrome, IVDU (abstinent since 2019), MDD, HIV.    VAC changed yesterday.  Remains on a Carb modified diet, meal intakes documented at 25-100% of meals. Patient reports good appetite and intake of meals. He is drinking Ensure or Glucerna shakes twice daily and extras on some days. Takes Prosource Plus on occasion.  Labs reviewed.  CBG: [phone]  Medications reviewed and include Novolog SSI, MVI with minerals, Flomax.   Diet Order:   Diet Order            Diet Carb Modified Fluid consistency: Thin; Room service appropriate? Yes  Diet effective now                 EDUCATION NEEDS:   Education needs have been addressed  Skin:  Skin Assessment: Skin Integrity Issues: Skin Integrity Issues:: Wound VAC Wound Vac: R chest wall abscess S/P I&D x 2 Other: R chest wall  abscess  Last BM:  4/16  Height:   Ht Readings from Last 1 Encounters:  03/25/21 6' (1.829 m)    Weight:   Wt Readings from Last 1 Encounters:  04/04/21 86.2 kg    Ideal Body Weight:  80.9 kg  BMI:  Body mass index is 25.77 kg/m.  Estimated Nutritional Needs:   Kcal:  2050-2250  Protein:  110-125 gm  Fluid:  >/= 2 L    Lucas Mallow, RD, LDN, CNSC Please refer to Amion for contact information.

## 2021-04-08 DIAGNOSIS — N179 Acute kidney failure, unspecified: Secondary | ICD-10-CM | POA: Diagnosis not present

## 2021-04-08 DIAGNOSIS — A419 Sepsis, unspecified organism: Secondary | ICD-10-CM | POA: Diagnosis not present

## 2021-04-08 DIAGNOSIS — J869 Pyothorax without fistula: Secondary | ICD-10-CM | POA: Diagnosis not present

## 2021-04-08 DIAGNOSIS — R6520 Severe sepsis without septic shock: Secondary | ICD-10-CM | POA: Diagnosis not present

## 2021-04-08 LAB — GLUCOSE, CAPILLARY
Glucose-Capillary: 132 mg/dL — ABNORMAL HIGH (ref 70–99)
Glucose-Capillary: 139 mg/dL — ABNORMAL HIGH (ref 70–99)
Glucose-Capillary: 153 mg/dL — ABNORMAL HIGH (ref 70–99)
Glucose-Capillary: 172 mg/dL — ABNORMAL HIGH (ref 70–99)

## 2021-04-08 NOTE — TOC Progression Note (Signed)
Transition of Care [facility]) - Progression Note    Patient Details  Name: Brian Malone MRN: [id number] Date of Birth: May 06, 1951  Transition of Care [facility]) CM/SW Contact  Loletha Grayer Beverely Pace, RN Phone Number: 04/08/2021, 1:03 PM  Clinical Narrative:    Case Manager called  Dr.Lightfoot's office to confirm followup appointment- scheduled for Friday, April 29,2022 @ 3:10pm. Entered on AVS. CM will request [employer] wound Vac when form is signed. Referral for [place] called to Adela Lank, [facility] Liaison. TOC Team will continue to monitor.      Expected Discharge Plan: [place] Barriers to Discharge: Continued Medical Work up  Expected Discharge Plan and Services Expected Discharge Plan: [place]   Discharge Planning Services: CM Consult   Living arrangements for the past 2 months: Single Family Home                           HH Arranged: RN [facility] Agency: [place] Date [place]: 04/08/21 Time HH Agency Contacted: 1000 Representative spoke with at [place]: Adela Lank   Social Determinants of Health (SDOH) Interventions    Readmission Risk Interventions No flowsheet data found.

## 2021-04-08 NOTE — Progress Notes (Signed)
PROGRESS NOTE    Brian Malone  IWP:[id number] DOB: 06/15/1951 DOA: 03/13/2021 PCP: Doreatha Lew, MD    Chief Complaint  Patient presents with  . Rib Injury    Brief Narrative:   70 y.o. male with history of chronic pain syndrome, hepatitis C, HIV, HTN, HLD, DM-2-who had sustained a fall in his yard on 3/6-following which she was diagnosed with acute right sixth, seventh and eighth rib fractures-he was treated conservatively-he presented to the ED on 3/25 with sepsis-and was found to have right chest wall abscess along with osteomyelitis.  Evaluated by CT surgery-infectious disease-underwent debridement-with wound VAC in place-ID recommending IV daptomycin through 4/22-until see below for further details.   Assessment & Plan:   Principal Problem:   Sepsis ([place]) Active Problems:   HIV (human immunodeficiency virus infection) ([place])   Anxiety disorder   Essential hypertension   AKI (acute kidney injury) ([place])   Lactic acidosis   Abscess of chest wall   Mixed diabetic hyperlipidemia associated with type 2 diabetes mellitus ([place])   GERD without esophagitis   BPH (benign prostatic hyperplasia)   Chronic hepatitis C without hepatic coma (HCC)   Protein-calorie malnutrition, severe   Osteomyelitis ([place])   Sepsis secondary to right-sided chest wall abscess in the setting of multiple rib fractures/osteomyelitis of the sixth rib.  Fluid cultures from the abscess positive for MRSA. Sepsis physiology has resolved. Status postdebridement, wound VAC in place and ID recommending 3 weeks of daptomycin from 3/31 till 4/22 and transition to oral Zyvox until patient sees ID in the clinic. Pain control with oral oxycodone.    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  AKI resolved.   Hypertension Well controlled, continue with bb.    Hyperlipidemia:  Continue with zetia.    Diabetes mellitus:  CBG (last 3)  Recent Labs    04/07/21 2033  04/08/21 0611 04/08/21 1155  GLUCAP 130* 132* 139*   Resume SSI.  A1c is 8.3%   Chronic pain syndrome:  As per previous encounters with physicians, there have been documentation regarding narcotic seeking behavior.    Normocytic anemia:  Probably sec to anemia of chronic disease and acute illness.  Transfuse to keep hemoglobin greater than 7.    BPH  On flomax.    GERD Continue with PPI.     Severe protein malnutrition:  Dietary consult.    HIV: Continue antiretrovirals  Hepatitis C:continue Mavyret for 4 more weeks from 3/17. HCVRNA 3/17 not detected.Follows up with a provider at [facility]  Opioid-induced constipation: Continue Movantik.    DVT prophylaxis: (Heparin) Code Status: (Full Code) Family Communication: NONE AT BEDSIDE.  Disposition:   Status is: Inpatient  Remains inpatient appropriate because:IV treatments appropriate due to intensity of illness or inability to take PO   Dispo: The patient is from: Home              Anticipated d/c is to: Home              Patient currently is not medically stable to d/c.   Difficult to place patient No       Level of care: Med-Surg Consultants:   CTVS.    Procedures: none.   Antimicrobials:  Antibiotics Given (last 72 hours)    Date/Time Action Medication Dose Rate   04/05/21 1943 New Bag/Given   DAPTOmycin (CUBICIN) 700 mg in sodium chloride 0.9 % IVPB 700 mg 128 mL/hr   04/06/21  0859 Given   abacavir-dolutegravir-lamiVUDine (TRIUMEQ) 600-50-300 MG per tablet 1 tablet 1 tablet    04/06/21 1041 Given   Glecaprevir-Pibrentasvir 100-40 MG TABS 3 tablet 3 tablet    04/06/21 1937 New Bag/Given   DAPTOmycin (CUBICIN) 700 mg in sodium chloride 0.9 % IVPB 700 mg 128 mL/hr   04/07/21 1041 Given   abacavir-dolutegravir-lamiVUDine (TRIUMEQ) 600-50-300 MG per tablet 1 tablet 1 tablet    04/07/21 1041 Given   Glecaprevir-Pibrentasvir 100-40 MG TABS 3 tablet 3 tablet    04/07/21 2019 New  Bag/Given   DAPTOmycin (CUBICIN) 700 mg in sodium chloride 0.9 % IVPB 700 mg 128 mL/hr   04/08/21 1009 Given   Glecaprevir-Pibrentasvir 100-40 MG TABS 3 tablet 3 tablet    04/08/21 1010 Given   abacavir-dolutegravir-lamiVUDine (TRIUMEQ) 600-50-300 MG per tablet 1 tablet 1 tablet           Subjective: No chest pain or sob, no nausea, or vomiting or abd pain. Requesting to arrange the transport and wound vac on discharge.  Concerned about his various appts.  Does not appear to be in distress.   Objective: Vitals:   04/07/21 1653 04/07/21 2031 04/07/21 2146 04/08/21 0620  BP: 119/76 122/67 129/83 125/62  Pulse: 74 75  70  Resp: 17 14    Temp: 98.2 F (36.8 C) 98 F (36.7 C)  98.1 F (36.7 C)  TempSrc: Oral Axillary  Axillary  SpO2: 99% 98%  97%  Weight:      Height:        Intake/Output Summary (Last 24 hours) at 04/08/2021 1345 Last data filed at 04/08/2021 0000 Gross per 24 hour  Intake 513.87 ml  Output 460 ml  Net 53.87 ml   Filed Weights   03/19/21 1330 03/25/21 0944 04/04/21 0412  Weight: 85.2 kg 85.2 kg 86.2 kg    Examination:  General exam: Appears calm and comfortable  Respiratory system: Clear to auscultation. Respiratory effort normal. Right check wall wound vac  Cardiovascular system: S1 & S2 heard, RRR. No JVD, . No pedal edema. Gastrointestinal system: Abdomen is nondistended, soft and nontender.Normal bowel sounds heard. Central nervous system: Alert and oriented. No focal neurological deficits. Extremities: Symmetric 5 x 5 power. Skin: No rashes, lesions or ulcers Psychiatry:. Mood & affect appropriate.     Data Reviewed: I have personally reviewed following labs and imaging studies  CBC: Recent Labs  Lab 04/07/21 0039  WBC 5.6  HGB 9.6*  HCT 29.6*  MCV 99.0  PLT 160    Basic Metabolic Panel: Recent Labs  Lab 04/07/21 0039  NA 137  K 4.6  CL 100  CO2 29  GLUCOSE 140*  BUN 29*  CREATININE 1.36*  CALCIUM 9.1     GFR: Estimated Creatinine Clearance: 57.9 mL/min (A) (by C-G formula based on SCr of 1.36 mg/dL (H)).  Liver Function Tests: No results for input(s): AST, ALT, ALKPHOS, BILITOT, PROT, ALBUMIN in the last 168 hours.  CBG: Recent Labs  Lab 04/07/21 1238 04/07/21 1657 04/07/21 2033 04/08/21 0611 04/08/21 1155  GLUCAP 162* 158* 130* 132* 139*     No results found for this or any previous visit (from the past 240 hour(s)).       Radiology Studies: No results found.      Scheduled Meds: . abacavir-dolutegravir-lamiVUDine  1 tablet Oral Daily  . busPIRone  30 mg Oral BID  . clonazepam  0.5 mg Oral QID  . ezetimibe  10 mg Oral Daily  . feeding supplement  237 mL Oral BID BM  . feeding supplement (GLUCERNA SHAKE)  237 mL Oral Q24H  . fentaNYL  1 patch Transdermal Q72H  . Glecaprevir-Pibrentasvir  3 tablet Oral Daily  . heparin injection (subcutaneous)  5,000 Units Subcutaneous Q8H  . insulin aspart  0-15 Units Subcutaneous TID AC & HS  . [START ON 04/10/2021] linezolid  600 mg Oral Q12H  . metoprolol tartrate  25 mg Oral BID  . multivitamin with minerals  1 tablet Oral Daily  . naloxegol oxalate  12.5 mg Oral QAC breakfast  . pantoprazole  40 mg Oral QAC breakfast  . tamsulosin  0.4 mg Oral QHS  . tiZANidine  2 mg Oral QHS   Continuous Infusions: . sodium chloride 10 mL/hr at 03/31/21 1524  . DAPTOmycin (CUBICIN)  IV 700 mg (04/07/21 2019)  . lactated ringers 10 mL/hr at 03/25/21 1007     LOS: 26 days        Hosie Poisson, MD [facility]   To contact the attending provider between 7A-7P or the covering provider during after hours 7P-7A, please log into the web site [email or web address] and access using universal [place] password for that web site. If you do not have the password, please call the hospital operator.  04/08/2021, 1:45 PM

## 2021-04-09 DIAGNOSIS — L02213 Cutaneous abscess of chest wall: Secondary | ICD-10-CM | POA: Diagnosis not present

## 2021-04-09 LAB — GLUCOSE, CAPILLARY
Glucose-Capillary: 146 mg/dL — ABNORMAL HIGH (ref 70–99)
Glucose-Capillary: 118 mg/dL — ABNORMAL HIGH (ref 70–99)
Glucose-Capillary: 122 mg/dL — ABNORMAL HIGH (ref 70–99)
Glucose-Capillary: 125 mg/dL — ABNORMAL HIGH (ref 70–99)

## 2021-04-09 NOTE — Care Management Important Message (Signed)
Important Message  Patient Details  Name: Brian Malone MRN: [id number] Date of Birth: May 27, 1951   Medicare Important Message Given:  Yes - Important Message mailed due to current National Emergency   Verbal consent obtained due to current National Emergency  Relationship to patient: Self Contact Name: Finnean Call Date: 04/09/21  Time: 4734 Phone: [phone] Outcome: No Answer/Busy Important Message mailed to: Patient address on file    Delorse Lek 04/09/2021, 2:31 PM

## 2021-04-09 NOTE — TOC Progression Note (Signed)
Transition of Care [facility]) - Progression Note    Patient Details  Name: Brian Malone MRN: [id number] Date of Birth: [birth year]  Transition of Care [facility]) CM/SW Contact  Pollie Friar, RN Phone Number: 04/09/2021, 1:36 PM  Clinical Narrative:    Order formed signed and faxed to [facility] with [employer]. TOC following.   Expected Discharge Plan: [place] Barriers to Discharge: Continued Medical Work up  Expected Discharge Plan and Services Expected Discharge Plan: [place]   Discharge Planning Services: CM Consult   Living arrangements for the past 2 months: Single Family Home                           HH Arranged: RN [facility] Agency: [place] Date [place]: 04/08/21 Time HH Agency Contacted: 1000 Representative spoke with at [place]: Adela Lank   Social Determinants of Health (SDOH) Interventions    Readmission Risk Interventions No flowsheet data found.

## 2021-04-09 NOTE — Progress Notes (Signed)
PROGRESS NOTE    Brian Malone  EXH:[id number] DOB: December 06, 1951 DOA: 03/13/2021 PCP: Doreatha Lew, MD    Chief Complaint  Patient presents with  . Rib Injury    Brief Narrative:   70 y.o. male with history of chronic pain syndrome, hepatitis C, HIV, HTN, HLD, DM-2-who had sustained a fall in his yard on 3/6-following which she was diagnosed with acute right sixth, seventh and eighth rib fractures-he was treated conservatively-he presented to the ED on 3/25 with sepsis-and was found to have right chest wall abscess along with osteomyelitis.  Evaluated by CT surgery-infectious disease-underwent debridement-with wound VAC in place-ID recommending IV daptomycin through 4/22-.   Pt seen and examined at bedside.  No new complaints today.    Assessment & Plan:   Principal Problem:   Sepsis ([place]) Active Problems:   HIV (human immunodeficiency virus infection) ([place])   Anxiety disorder   Essential hypertension   AKI (acute kidney injury) ([place])   Lactic acidosis   Abscess of chest wall   Mixed diabetic hyperlipidemia associated with type 2 diabetes mellitus ([place])   GERD without esophagitis   BPH (benign prostatic hyperplasia)   Chronic hepatitis C without hepatic coma (HCC)   Protein-calorie malnutrition, severe   Osteomyelitis ([place])   Sepsis secondary to right-sided chest wall abscess in the setting of multiple rib fractures/osteomyelitis of the sixth rib.  Fluid cultures from the abscess positive for MRSA. Sepsis physiology has resolved. Status postdebridement, wound VAC in place and ID recommending 3 weeks of daptomycin from 3/31 till 4/22 and transition to oral Zyvox until patient sees ID in the clinic. Pain control with oral oxycodone. No new complaints.     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  AKI resolved.   Hypertension Well controlled.    Hyperlipidemia:  Continue with zetia.    Type 2 DM non insulin dependent.   CBG (last 3)  Recent Labs    04/08/21 1959 04/09/21 0617 04/09/21 1223  GLUCAP 172* 146* 118*   Resume SSI.  A1c is 8.3%   Chronic pain syndrome:  As per previous encounters with physicians, there have been documentation regarding narcotic seeking behavior.  Please do not increase the dose of pain meds.    Normocytic anemia:  Probably sec to anemia of chronic disease and acute illness.  Transfuse to keep hemoglobin greater than 7.    BPH  On flomax.    GERD Continue with PPI.     Severe protein malnutrition:  Dietary consult.    HIV: Continue antiretrovirals  Hepatitis C:continue Mavyret for 4 more weeks from 3/17. HCVRNA 3/17 not detected.Follows up with a provider at [facility]  Opioid-induced constipation: Continue Movantik.    DVT prophylaxis: (Heparin) Code Status: (Full Code) Family Communication: NONE AT BEDSIDE.  Disposition:   Status is: Inpatient  Remains inpatient appropriate because:IV treatments appropriate due to intensity of illness or inability to take PO   Dispo: The patient is from: Home              Anticipated d/c is to: Home              Patient currently is not medically stable to d/c.   Difficult to place patient No       Level of care: Med-Surg Consultants:   CTVS.    Procedures: none.   Antimicrobials:  Antibiotics Given (last 72 hours)    Date/Time Action Medication Dose Rate  04/06/21 1937 New Bag/Given   DAPTOmycin (CUBICIN) 700 mg in sodium chloride 0.9 % IVPB 700 mg 128 mL/hr   04/07/21 1041 Given   abacavir-dolutegravir-lamiVUDine (TRIUMEQ) 600-50-300 MG per tablet 1 tablet 1 tablet    04/07/21 1041 Given   Glecaprevir-Pibrentasvir 100-40 MG TABS 3 tablet 3 tablet    04/07/21 2019 New Bag/Given   DAPTOmycin (CUBICIN) 700 mg in sodium chloride 0.9 % IVPB 700 mg 128 mL/hr   04/08/21 1009 Given   Glecaprevir-Pibrentasvir 100-40 MG TABS 3 tablet 3 tablet    04/08/21 1010 Given    abacavir-dolutegravir-lamiVUDine (TRIUMEQ) 600-50-300 MG per tablet 1 tablet 1 tablet    04/08/21 1943 New Bag/Given   DAPTOmycin (CUBICIN) 700 mg in sodium chloride 0.9 % IVPB 700 mg 128 mL/hr   04/09/21 0915 Given   abacavir-dolutegravir-lamiVUDine (TRIUMEQ) 600-50-300 MG per tablet 1 tablet 1 tablet    04/09/21 0936 Given   Glecaprevir-Pibrentasvir 100-40 MG TABS 3 tablet 3 tablet          Subjective: No new complaints. Is pleased with all the appointments. Looking forward to going home.   Objective: Vitals:   04/08/21 2001 04/08/21 2153 04/09/21 0343 04/09/21 1455  BP: 111/67 125/77 125/80 108/74  Pulse: 72  75 84  Resp: 18  18 17   Temp: 98.1 F (36.7 C)  98 F (36.7 C) 98 F (36.7 C)  TempSrc: Oral  Oral Oral  SpO2: 98%  98% 100%  Weight:      Height:        Intake/Output Summary (Last 24 hours) at 04/09/2021 1626 Last data filed at 04/09/2021 0630 Gross per 24 hour  Intake 57.68 ml  Output 700 ml  Net -642.32 ml   Filed Weights   03/19/21 1330 03/25/21 0944 04/04/21 0412  Weight: 85.2 kg 85.2 kg 86.2 kg    Examination:  General exam: Appears calm and comfortable  Respiratory system: air entry fair, no wheezing heard.  Right chest wall wound vac  Cardiovascular system: S1S2 [place]ard, RRR, no JVD, no pedal edema.  Gastrointestinal system: Abdomen is soft, NT ND BS+ Central nervous system: alert and comfortable.  Extremities: no pedal edema.  Skin: No rashes seen.  Psychiatry:. Mood is appropriate.     Data Reviewed: I have personally reviewed following labs and imaging studies  CBC: Recent Labs  Lab 04/07/21 0039  WBC 5.6  HGB 9.6*  HCT 29.6*  MCV 99.0  PLT 539    Basic Metabolic Panel: Recent Labs  Lab 04/07/21 0039  NA 137  K 4.6  CL 100  CO2 29  GLUCOSE 140*  BUN 29*  CREATININE 1.36*  CALCIUM 9.1    GFR: Estimated Creatinine Clearance: 57.9 mL/min (A) (by C-G formula based on SCr of 1.36 mg/dL (H)).  Liver Function Tests: No  results for input(s): AST, ALT, ALKPHOS, BILITOT, PROT, ALBUMIN in the last 168 hours.  CBG: Recent Labs  Lab 04/08/21 1155 04/08/21 1726 04/08/21 1959 04/09/21 0617 04/09/21 1223  GLUCAP 139* 153* 172* 146* 118*     No results found for this or any previous visit (from the past 240 hour(s)).       Radiology Studies: No results found.      Scheduled Meds: . abacavir-dolutegravir-lamiVUDine  1 tablet Oral Daily  . busPIRone  30 mg Oral BID  . clonazepam  0.5 mg Oral QID  . ezetimibe  10 mg Oral Daily  . feeding supplement  237 mL Oral BID BM  . feeding supplement (GLUCERNA  SHAKE)  237 mL Oral Q24H  . fentaNYL  1 patch Transdermal Q72H  . Glecaprevir-Pibrentasvir  3 tablet Oral Daily  . heparin injection (subcutaneous)  5,000 Units Subcutaneous Q8H  . insulin aspart  0-15 Units Subcutaneous TID AC & HS  . [START ON 04/10/2021] linezolid  600 mg Oral Q12H  . metoprolol tartrate  25 mg Oral BID  . multivitamin with minerals  1 tablet Oral Daily  . naloxegol oxalate  12.5 mg Oral QAC breakfast  . pantoprazole  40 mg Oral QAC breakfast  . tamsulosin  0.4 mg Oral QHS  . tiZANidine  2 mg Oral QHS   Continuous Infusions: . sodium chloride 10 mL/hr at 03/31/21 1524  . DAPTOmycin (CUBICIN)  IV 700 mg (04/08/21 1943)  . lactated ringers 10 mL/hr at 03/25/21 1007     LOS: 27 days        Hosie Poisson, MD [facility]   To contact the attending provider between 7A-7P or the covering provider during after hours 7P-7A, please log into the web site [email or web address] and access using universal [place] password for that web site. If you do not have the password, please call the hospital operator.  04/09/2021, 4:26 PM

## 2021-04-10 ENCOUNTER — Inpatient Hospital Stay (HOSPITAL_COMMUNITY): Payer: Medicare Other

## 2021-04-10 ENCOUNTER — Other Ambulatory Visit (HOSPITAL_COMMUNITY): Payer: Self-pay

## 2021-04-10 DIAGNOSIS — L02213 Cutaneous abscess of chest wall: Secondary | ICD-10-CM | POA: Diagnosis not present

## 2021-04-10 LAB — GLUCOSE, CAPILLARY
Glucose-Capillary: 109 mg/dL — ABNORMAL HIGH (ref 70–99)
Glucose-Capillary: 180 mg/dL — ABNORMAL HIGH (ref 70–99)
Glucose-Capillary: 159 mg/dL — ABNORMAL HIGH (ref 70–99)

## 2021-04-10 MED ORDER — SENNOSIDES-DOCUSATE SODIUM 8.6-50 MG PO TABS: 2.0000 | ORAL_TABLET | Freq: Every evening | ORAL | Status: DC | PRN

## 2021-04-10 MED ORDER — PROSOURCE PLUS PO LIQD
30.0000 mL | Freq: Two times a day (BID) | ORAL | 3 refills | Status: DC | PRN
  Filled 2021-04-10: qty 887, 15d supply, fill #0

## 2021-04-10 MED ORDER — OXYCODONE-ACETAMINOPHEN 5-325 MG PO TABS
2.0000 | ORAL_TABLET | ORAL | 0 refills | Status: DC | PRN
  Filled 2021-04-10: qty 30, 3d supply, fill #0

## 2021-04-10 MED ORDER — OXYCODONE HCL 5 MG PO TABS
5.0000 mg | ORAL_TABLET | ORAL | 0 refills | Status: DC | PRN
  Filled 2021-04-10: qty 30, 5d supply, fill #0

## 2021-04-10 MED ORDER — METOPROLOL TARTRATE 25 MG PO TABS
25.0000 mg | ORAL_TABLET | Freq: Two times a day (BID) | ORAL | 1 refills | Status: DC
  Filled 2021-04-10: qty 60, 30d supply, fill #0

## 2021-04-10 MED ORDER — GLUCERNA SHAKE PO LIQD
237.0000 mL | ORAL | 12 refills | Status: DC
  Filled 2021-04-10: qty 237, 1d supply, fill #0

## 2021-04-10 MED ORDER — OXYCODONE-ACETAMINOPHEN 5-325 MG PO TABS
1.0000 | ORAL_TABLET | Freq: Four times a day (QID) | ORAL | 0 refills | Status: DC | PRN
  Filled 2021-04-10: qty 28, 7d supply, fill #0

## 2021-04-10 MED ORDER — ENSURE ENLIVE PO LIQD
237.0000 mL | Freq: Two times a day (BID) | ORAL | 12 refills | Status: DC
  Filled 2021-04-10: qty 237, 1d supply, fill #0

## 2021-04-10 MED ORDER — ADULT MULTIVITAMIN W/MINERALS CH: 1.0000 | ORAL_TABLET | Freq: Every day | ORAL | Status: DC

## 2021-04-10 MED ORDER — LINEZOLID 600 MG PO TABS
600.0000 mg | ORAL_TABLET | Freq: Two times a day (BID) | ORAL | 0 refills | Status: DC
  Filled 2021-04-10: qty 42, 21d supply, fill #0

## 2021-04-10 MED ORDER — OXYCODONE-ACETAMINOPHEN 5-325 MG PO TABS
2.0000 | ORAL_TABLET | ORAL | Status: DC | PRN
  Administered 2021-04-10 (×2): 2 via ORAL
  Filled 2021-04-10 (×2): qty 2

## 2021-04-10 MED ORDER — OXYCODONE-ACETAMINOPHEN 10-325 MG PO TABS
1.0000 | ORAL_TABLET | ORAL | 0 refills | Status: AC | PRN
  Filled 2021-04-10 (×2): qty 36, 6d supply, fill #0

## 2021-04-10 NOTE — Progress Notes (Signed)
Pt feeling extremely anxious and feels as though something is "Not right". He mentioned mild shortness of breath when walking. States that he isn't worried or anxious about going home but continues to say that he feels antsy and anxious and like hes coming out of his skin. This RN noted some hypervigilance and restlessness upon assessment. Physician notified.

## 2021-04-10 NOTE — Discharge Summary (Signed)
Physician Discharge Summary  Brian Malone [id number] DOB: September 20, 1951 DOA: 03/13/2021  PCP: Doreatha Lew, MD  Admit date: 03/13/2021 Discharge date: 04/10/2021  Admitted From: Home.  Disposition: Home.   Recommendations for Outpatient Follow-up:  1. Follow up with PCP in 1-2 weeks 2. Please obtain BMP/CBC in one week 3. Please follow up with ID as recommended  4. Please follow up with cardiothoracic surgery as scheduled.  5. Please follow up with pain clinic in one week as scheduled.    Home Health: yes.  Equipment/Devices: wound vac.   Discharge Condition: stable.  CODE STATUS: full code.  Diet recommendation: Heart Healthy   Brief/Interim Summary:  70 y.o.malewith history of chronic pain syndrome, hepatitis C, HIV, HTN, HLD, DM-2-who had sustained a fall in his yard on 3/6-following which she was diagnosed with acute right sixth, seventh and eighth rib fractures-he was treated conservatively-he presented to the ED on 3/25 with sepsis-and was found to have right chest wall abscess along with osteomyelitis. Evaluated by CT surgery-infectious disease-underwent debridement-with wound VAC in place-ID recommending IV daptomycin through 4/22 followed by 2 weeks of zyvox.   Pt seen and examined at bedside.  No new complaints today.    Discharge Diagnoses:  Principal Problem:   Sepsis ([place]) Active Problems:   HIV (human immunodeficiency virus infection) ([place])   Anxiety disorder   Essential hypertension   AKI (acute kidney injury) ([place])   Lactic acidosis   Abscess of chest wall   Mixed diabetic hyperlipidemia associated with type 2 diabetes mellitus ([place])   GERD without esophagitis   BPH (benign prostatic hyperplasia)   Chronic hepatitis C without hepatic coma ([facility])   Protein-calorie malnutrition, severe   Osteomyelitis ([place])   Sepsis secondary to right-sided chest wall abscess in the setting of multiple rib fractures/osteomyelitis of the sixth rib.  Fluid  cultures from the abscess positive for MRSA. Sepsis physiology has resolved. Status postdebridement, wound VAC in place and ID recommending 3 weeks of daptomycin from 3/31 till 4/22 and transition to oral Zyvox until patient sees ID in the clinic. Pain control with oral oxycodone. No new complaints.     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  AKI resolved.   Hypertension Well controlled.    Hyperlipidemia:  Continue with zetia.    Type 2 DM non insulin dependent. CBG (last 3)  Recent Labs    04/10/21 0617 04/10/21 1221 04/10/21 1647  GLUCAP 109* 180* 159*   Resume home meds on discharge.  A1c is 8.3%   Chronic pain syndrome:  As per previous encounters with physicians, there have been documentation regarding narcotic seeking behavior.  Please do not increase the dose of pain meds.    Normocytic anemia:  Probably sec to anemia of chronic disease and acute illness.  Transfuse to keep hemoglobin greater than 7.    BPH  On flomax.    GERD Continue with PPI.     Severe protein malnutrition:  Dietary consult.    [id number] antiretrovirals  Hepatitis C:continue Mavyret for 4 more weeks from 3/17. HCVRNA 3/17 not detected.Follows up with a provider at [facility]  Opioid-induced constipation: continue with stool softeners.     Discharge Instructions  Discharge Instructions    Diet - low sodium heart healthy   Complete by: As directed    Discharge wound care:   Complete by: As directed    Change wound vac dressing Monday and Friday as recommended by cardiothoracic  surgery.   Increase activity slowly   Complete by: As directed      Allergies as of 04/10/2021      Reactions   Adhesive [tape] Other (See Comments)   "Tape will take off my skin, as will Band-Aids"   Cyclobenzaprine Other (See Comments)   Hallucinations   Duloxetine Hcl Other (See Comments)   Makes PTSD- induced  nightmares more vivid   Morphine Itching, Other (See Comments)   Hallucinations, aggression, and makes patient altered also      Medication List    STOP taking these medications   Aimovig 140 MG/ML Soaj Generic drug: Erenumab-aooe   isosorbide mononitrate 60 MG 24 hr tablet Commonly known as: IMDUR   oxyCODONE 5 MG immediate release tablet Commonly known as: Oxy IR/ROXICODONE   propranolol ER 60 MG 24 hr capsule Commonly known as: INDERAL LA     TAKE these medications   (feeding supplement) PROSource Plus liquid Take 30 mLs by mouth 2 (two) times daily as needed (Provide for additional protein as pt requests).   feeding supplement Liqd Take 237 mLs by mouth 2 (two) times daily between meals.   feeding supplement (GLUCERNA SHAKE) Liqd Take 237 mLs by mouth daily.   Belbuca 150 MCG Film Generic drug: Buprenorphine HCl Place 1 Film inside cheek every 12 (twelve) hours.   busPIRone 30 MG tablet Commonly known as: BUSPAR Take 30 mg by mouth 2 (two) times daily. What changed: Another medication with the same name was removed. Continue taking this medication, and follow the directions you see here.   clonazePAM 0.5 MG tablet Commonly known as: KLONOPIN Take 0.5 mg by mouth 3 (three) times daily.   ezetimibe 10 MG tablet Commonly known as: ZETIA Take 10 mg by mouth daily.   linezolid 600 MG tablet Commonly known as: ZYVOX Take 1 tablet (600 mg total) by mouth 2 (two) times daily for 21 days. Start taking 03/05/21.   loratadine 10 MG tablet Commonly known as: CLARITIN Take 10 mg by mouth daily.   Mavyret 100-40 MG Tabs Generic drug: Glecaprevir-Pibrentasvir Take 3 tablets by mouth daily.   metoprolol tartrate 25 MG tablet Commonly known as: LOPRESSOR Take 1 tablet (25 mg total) by mouth 2 (two) times daily.   multivitamin with minerals Tabs tablet Take 1 tablet by mouth daily.   oxyCODONE-acetaminophen 10-325 MG tablet Commonly known as: Percocet Take 1  tablet by mouth every 4 (four) hours as needed for up to 6 days for pain.   pantoprazole 40 MG tablet Commonly known as: PROTONIX Take 40 mg by mouth daily before breakfast.   Semaglutide(0.25 or 0.5MG /DOS) 2 MG/1.5ML Sopn Inject 0.5 mg into the skin every Friday.   senna-docusate 8.6-50 MG tablet Commonly known as: Senokot-S Take 2 tablets by mouth at bedtime as needed for mild constipation.   tamsulosin 0.4 MG Caps capsule Commonly known as: FLOMAX Take 0.4 mg by mouth at bedtime.   tiZANidine 4 MG tablet Commonly known as: ZANAFLEX Take 4 mg by mouth at bedtime.   Triumeq 600-50-300 MG tablet Generic drug: abacavir-dolutegravir-lamiVUDine Take 1 tablet by mouth daily.   valACYclovir 1000 MG tablet Commonly known as: VALTREX Take 1,000 mg by mouth 3 (three) times daily as needed (as directed for trigeminal neuralgia flares). Trig neuralgia flare            Discharge Care Instructions  (From admission, onward)         Start     Ordered   04/10/21 0000  Discharge wound care:       Comments: Change wound vac dressing Monday and Friday as recommended by cardiothoracic surgery.   04/10/21 [id number]          Follow-up Information    Lajuana Matte, MD. Go on 04/17/2021.   Specialty: Cardiothoracic Surgery Why: Patient needs to bring wound VAC change supplies (sponge and dressings). Appointment time is at 3:10 pm Contact information: [address] [place] [address] [place], [facility] Follow up.   Specialty: [place] Why: A representative from [facility] will contact you to arrange start date and time for your services. Contact information: [place] [address] [phone]        [name], [name], MD. Schedule an appointment as soon as possible for a visit in 1 week(s).   Specialty: Family Medicine Contact information: [place]  [id number] [phone]        Elouise Munroe, MD .   Specialties: Cardiology, Radiology Contact information: [address] [place] [address] [phone]              Allergies  Allergen Reactions  . Adhesive [Tape] Other (See Comments)    "Tape will take off my skin, as will Band-Aids"  . Cyclobenzaprine Other (See Comments)    Hallucinations  . Duloxetine Hcl Other (See Comments)    Makes PTSD- induced nightmares more vivid   . Morphine Itching and Other (See Comments)    Hallucinations, aggression, and makes patient altered also      Consultations:  ID  Cardiothoracic surgery.    Procedures/Studies: CT CHEST W CONTRAST  Result Date: 03/22/2021 CLINICAL DATA:  70 year old male with chest pain and shortness of breath. EXAM: CT CHEST WITH CONTRAST TECHNIQUE: Multidetector CT imaging of the chest was performed during intravenous contrast administration. CONTRAST:  12mL OMNIPAQUE IOHEXOL 300 MG/ML  SOLN COMPARISON:  Chest CT dated 03/17/2021. FINDINGS: Cardiovascular: There is no cardiomegaly or pericardial effusion. There is 3 vessel coronary vascular calcification. There is mild atherosclerotic calcification of the thoracic aorta. No aneurysmal dilatation or dissection. The origins of the great vessels of the aortic arch appear patent as visualized. No pulmonary artery embolus identified. Mediastinum/Nodes: No hilar or mediastinal adenopathy. The esophagus is grossly unremarkable. No mediastinal fluid collection. Lungs/Pleura: Small bilateral pleural effusions with partial compressive atelectasis of the lower lobes. Pneumonia is not excluded. Clinical correlation is recommended. No lobar consolidation or pneumothorax. The central airways are patent. Upper Abdomen: No acute abnormality. Musculoskeletal: Degenerative changes of the spine. There is an area of inflammatory changes with small pockets of air in the right anterior chest wall along the anterior  right sixth rib. There is fragmentation of the anterior right sixth rib concerning for osteomyelitis. Clinical correlation is recommended. No drainable fluid collection or abscess identified. There is thickening of the adjacent pleura. There is a large skin wound in the lower right anterior chest wall with a wound VAC. IMPRESSION: 1. No CT evidence of pulmonary embolism. 2. Small bilateral pleural effusions with partial compressive atelectasis of the lower lobes. Pneumonia is not excluded. Clinical correlation is recommended. 3. Large skin wound in the lower right anterior chest wall with a wound VAC. 4. Destructive changes of the anterior right sixth rib concerning for osteomyelitis. Clinical correlation is recommended. No drainable fluid collection or abscess. 5. Aortic Atherosclerosis (ICD10-I70.0). Electronically Signed  By: Anner Crete M.D.   On: 03/22/2021 19:48   CT CHEST W CONTRAST  Result Date: 03/17/2021 CLINICAL DATA:  Infected right chest wall hematoma EXAM: CT CHEST WITH CONTRAST TECHNIQUE: Multidetector CT imaging of the chest was performed during intravenous contrast administration. CONTRAST:  75 mL OMNIPAQUE IOHEXOL 350 MG/ML SOLN COMPARISON:  03/13/2021 FINDINGS: Cardiovascular: Heart size within normal limits. Coronary artery calcifications seen throughout. No significant vascular abnormality identified. Mediastinum/Nodes: No enlarged mediastinal, hilar, or axillary lymph nodes. Lungs/Pleura: Trace bilateral pleural effusions with adjacent atelectasis. Upper Abdomen: No acute abnormality. Musculoskeletal: Previously seen right chest wall collection has decreased in size since prior examination, measuring approximately 7.4 x 7.1 x 3.7 cm compared to 10.1 x 9.1 x 5.7 cm on prior exam from 03/13/2021. The collection is somewhat bilobed. The inferior portion of the right chest wall collection has decreased to a greater degree than the superior lobulation. Greater amount of air within the  collection likely due to presence of drains. Lucency and irregularity of the right sixth and seventh ribs, anterior segment again noted. IMPRESSION: 1. Interval decrease in size of bilobed right anterolateral chest wall fluid collection. The inferior lobulation is nearly completely resolved with some fat stranding and subcutaneous emphysema remaining. The more superior lobulation has decreased in size since prior study from 03/13/2021. The overall size of the collections now measures 7.4 x 7.1 x 3.7 cm compared to 10.1 x 9.1 x 5.7 cm on 03/13/2021. 2. Lucency in the anterior segments of the right sixth and seventh ribs again seen. Findings are suspicious for osteomyelitis, however underlying pathologic fracture is also possible. Electronically Signed   By: Miachel Roux M.D.   On: 03/17/2021 14:05   CT Chest W Contrast  Result Date: 03/13/2021 CLINICAL DATA:  Follow-up right chest wall hematoma and fractures of the right sixth through eighth ribs. Diabetes. HIV. EXAM: CT CHEST WITH CONTRAST TECHNIQUE: Multidetector CT imaging of the chest was performed during intravenous contrast administration. CONTRAST:  129mL OMNIPAQUE IOHEXOL 300 MG/ML  SOLN COMPARISON:  03/09/2021 FINDINGS: Cardiovascular: Heart size remains normal. No pericardial fluid. Coronary artery calcification as seen previously. Aortic atherosclerotic calcification as seen previously. Mediastinum/Nodes: Normal Lungs/Pleura: Scarring/atelectasis at the lung bases left more than right unchanged since the previous study. Small areas of patchy density in the superior segment of the right lower lobe unchanged from the study of 4 days ago. No new or progressive pulmonary finding. No pneumothorax. Upper Abdomen: No upper abdominal injury or acute finding. Musculoskeletal: There is increasing size of the right chest wall collections, primarily centered around the right sixth and eighth anterolateral ribs. Those ribs show a pattern of irregular destruction  that look more like pathologic fractures due to infection rather than traumatic fractures. Air bubbles are now present within the collections. There is mild extrapleural involvement in side of the ribcage, without frank breakthrough into the pleural space. Most of the collections manifest external to the ribcage. No new areas of involvement are seen. IMPRESSION: Increasing size of the right chest wall collections, primarily centered around the right sixth and eighth anterolateral ribs, with development of air bubbles. Those ribs show a pattern of irregular destruction that have progressed and look more like pathologic fractures due to infection rather than traumatic fractures. Air bubbles are now present within the collections and they probably represent abscesses. There is mild extrapleural involvement on the inner side of the ribcage, without frank breakthrough into the pleural space. The majority of the collections are on the outer  side of the ribcage. No new areas of involvement are seen. Case discussed with Dr. Regenia Skeeter at 1419 hours. Aortic Atherosclerosis (ICD10-I70.0). Electronically Signed   By: Nelson Chimes M.D.   On: 03/13/2021 14:19   IR US Guide Bx Asp/Drain  Result Date: 03/14/2021 INDICATION: 70 year old with history of fall and right rib fractures. Patient developed right chest hematomas. Patient is complaining of pain and recent CT demonstrates gas within the hematomas. Findings are concerning for infected hematomas. EXAM: PLACEMENT OF SUPERFICIAL RIGHT ANTERIOR CHEST WALL DRAIN USING ULTRASOUND GUIDANCE x 2 MEDICATIONS: Moderate sedation ANESTHESIA/SEDATION: Fentanyl 100 mcg IV; Versed 2.0 mg IV Moderate Sedation Time:  29 minutes The patient was continuously monitored during the procedure by the interventional radiology nurse under my direct supervision. COMPLICATIONS: None immediate. PROCEDURE: Informed written consent was obtained from the patient after a thorough discussion of the  procedural risks, benefits and alternatives. All questions were addressed. Maximal Sterile Barrier Technique was utilized including caps, mask, sterile gowns, sterile gloves, sterile drape, hand hygiene and skin antiseptic. A timeout was performed prior to the initiation of the procedure. The right anterior chest wall hematomas were evaluated with ultrasound. Complex collections containing a small amount of fluid were obtained. The right anterior chest was prepped with chlorhexidine and sterile field was created. Attention was initially directed to the more inferior or caudal anterior chest wall collection. Skin was anesthetized with 1% lidocaine. Using ultrasound guidance, an 18 gauge trocar needle was directed into the collection and pink purulent fluid was aspirated. Superstiff Amplatz wire was advanced into the complex collection and the tract was dilated to accommodate a 10 [place] drain. Catheter was sutured to the skin and attached to a suction bulb. A sample of fluid was sent for culture. A second area more cephalad was targeted with ultrasound guidance. Skin was anesthetized with 1% lidocaine. Using ultrasound guidance, an 18 gauge trocar needle was directed into the complex collection. Again, purulent fluid was aspirated. Superstiff Amplatz wire was advanced into this collection and a 10 French drain was placed. Additional purulent fluid was aspirated and the catheter was sutured to skin and attached to a suction bulb. Dressings were placed over both drains. FINDINGS: Patient has palpable hematomas in the right anterior chest and upper abdomen region. Ultrasound demonstrates heterogeneous collections in both areas containing a small amount of compressible fluid. There are echogenic areas within the collections compatible with known gas. 10 French drains were placed in both areas and purulent fluid was draining from both collections. Both collections are very complex and compatible with infected hematomas.  IMPRESSION: Ultrasound-guided placement of 2 percutaneous drains within the superficial right anterior chest wall collections. Findings are compatible with infected hematomas. Fluid sample was sent for culture. Electronically Signed   By: Markus Daft M.D.   On: 03/14/2021 18:37   IR US Guide Bx Asp/Drain  Result Date: 03/14/2021 INDICATION: 70 year old with history of fall and right rib fractures. Patient developed right chest hematomas. Patient is complaining of pain and recent CT demonstrates gas within the hematomas. Findings are concerning for infected hematomas. EXAM: PLACEMENT OF SUPERFICIAL RIGHT ANTERIOR CHEST WALL DRAIN USING ULTRASOUND GUIDANCE x 2 MEDICATIONS: Moderate sedation ANESTHESIA/SEDATION: Fentanyl 100 mcg IV; Versed 2.0 mg IV Moderate Sedation Time:  29 minutes The patient was continuously monitored during the procedure by the interventional radiology nurse under my direct supervision. COMPLICATIONS: None immediate. PROCEDURE: Informed written consent was obtained from the patient after a thorough discussion of the procedural risks, benefits and alternatives. All  questions were addressed. Maximal Sterile Barrier Technique was utilized including caps, mask, sterile gowns, sterile gloves, sterile drape, hand hygiene and skin antiseptic. A timeout was performed prior to the initiation of the procedure. The right anterior chest wall hematomas were evaluated with ultrasound. Complex collections containing a small amount of fluid were obtained. The right anterior chest was prepped with chlorhexidine and sterile field was created. Attention was initially directed to the more inferior or caudal anterior chest wall collection. Skin was anesthetized with 1% lidocaine. Using ultrasound guidance, an 18 gauge trocar needle was directed into the collection and pink purulent fluid was aspirated. Superstiff Amplatz wire was advanced into the complex collection and the tract was dilated to accommodate a 10  [place] drain. Catheter was sutured to the skin and attached to a suction bulb. A sample of fluid was sent for culture. A second area more cephalad was targeted with ultrasound guidance. Skin was anesthetized with 1% lidocaine. Using ultrasound guidance, an 18 gauge trocar needle was directed into the complex collection. Again, purulent fluid was aspirated. Superstiff Amplatz wire was advanced into this collection and a 10 French drain was placed. Additional purulent fluid was aspirated and the catheter was sutured to skin and attached to a suction bulb. Dressings were placed over both drains. FINDINGS: Patient has palpable hematomas in the right anterior chest and upper abdomen region. Ultrasound demonstrates heterogeneous collections in both areas containing a small amount of compressible fluid. There are echogenic areas within the collections compatible with known gas. 10 French drains were placed in both areas and purulent fluid was draining from both collections. Both collections are very complex and compatible with infected hematomas. IMPRESSION: Ultrasound-guided placement of 2 percutaneous drains within the superficial right anterior chest wall collections. Findings are compatible with infected hematomas. Fluid sample was sent for culture. Electronically Signed   By: Markus Daft M.D.   On: 03/14/2021 18:37   DG Chest Port 1 View  Result Date: 03/13/2021 CLINICAL DATA:  Sepsis, right rib pain. EXAM: PORTABLE CHEST 1 VIEW COMPARISON:  March 09, 2021. FINDINGS: The heart size and mediastinal contours are within normal limits. Both lungs are clear. No pneumothorax or pleural effusion is noted. The visualized skeletal structures are unremarkable. IMPRESSION: No active disease. Electronically Signed   By: Marijo Conception M.D.   On: 03/13/2021 14:11   [place] IMAGE GUIDED DRAINAGE BY PERCUTANEOUS CATHETER  Result Date: 03/18/2021 INDICATION: History of right-sided rib fractures complicated by development of  infected adjacent hematomas, post ultrasound-guided placement of two percutaneous drainage catheters on 03/14/2021. Unfortunately, one of the percutaneous drainage catheters was inadvertently removed with subsequent chest CT performed 03/17/2021 demonstrating a persistent undrained complex fluid collection involving the anterior aspect of the right chest wall. As such, request made for ultrasound-guided aspiration and/or drainage catheter placement for infection source control purposes. EXAM: 1. ULTRASOUND-GUIDED RIGHT CHEST WALL PERCUTANEOUS DRAINAGE CATHETER PLACEMENT X2 2. ULTRASOUND-GUIDED RIGHT CHEST WALL ABSCESS ASPIRATION COMPARISON:  Chest CT-03/17/2021; 03/13/2021; ultrasound-guided right chest wall percutaneous drainage catheter placement x2-03/14/2021 MEDICATIONS: The patient is currently admitted to the hospital and receiving intravenous antibiotics. The antibiotics were administered within an appropriate time frame prior to the initiation of the procedure. ANESTHESIA/SEDATION: Moderate (conscious) sedation was employed during this procedure. A total of Versed 4 mg and Fentanyl 200 mcg was administered intravenously. Moderate Sedation Time: 41 minutes. The patient's level of consciousness and vital signs were monitored continuously by radiology nursing throughout the procedure under my direct supervision. CONTRAST:  None COMPLICATIONS:  None immediate. PROCEDURE: Informed written consent was obtained from the patient after a discussion of the risks, benefits and alternatives to treatment. Preprocedural ultrasound scanning demonstrated complex fluid involving the anterior aspect of the right chest wall compatible with the findings seen on preceding chest CT. A timeout was performed prior to the initiation of the procedure. The skin overlying the right anterior chest was prepped and draped in the usual sterile fashion. The overlying soft tissues were anesthetized with 1% lidocaine with epinephrine. Under  direct ultrasound guidance, a 18 gauge trocar needle was advanced into the complex abscess within the right chest wall, inferior to the nipple. A short Amplatz wire was coiled within the collection. The track was dilated ultimately allowing placement of a 10 French percutaneous catheter. Multiple ultrasound images were saved procedural documentation purposes. Next, approximately 10 cc of purulent fluid was aspirated from the drainage catheter Sonographic evaluation demonstrates a residual complex fluid collection and as such the more medial component of the collection was accessed with an 18 gauge trocar needle. A short Amplatz wire was coiled within the collection and an additional 10 French percutaneous drainage catheter was placed under ultrasound guidance. Multiple ultrasound images were saved procedural documentation purposes. Next, approximately 30 cc of purulent fluid was aspirated from this more medially positioned chest wall drain. A representative sample was capped and sent to the laboratory for analysis. Next, the lateral component of the right chest wall collection was accessed with an 18 gauge trocar needle however only a small amount of bloody fluid was able to be aspirated. As such, a short Amplatz wire was coiled within the collection and the trocar needle was exchanged for a Yueh sheath catheter which was utilized to aspirate approximately 3 cc of thick bloody fluid. At this time, approximately 70 cc of additional blood-tinged purulent fluid was able to be expressed from the site of the previously removed percutaneous drainage catheter. Drainage catheters were secured at the entrance site within interrupted sutures and drainage catheters were connected to JP bulbs. Dressings were applied. The patient tolerated the procedure well without immediate postprocedural complication. IMPRESSION: 1. Successful ultrasound-guided placement of 2 two additional right anterior chest wall percutaneous drainage  catheters yielding a total of 40 cc of purulent fluid. A representative aspirated sample was sent to the laboratory for analysis. 2. Successful ultrasound-guided aspiration of approximately 3 cc of thick bloody fluid from the more lateral component of the right anterior chest wall complex hematoma. 3. Successful bedside expression of approximately 70 cc of purulent fluid from the entrance site of the recently removed percutaneous drainage catheter. Electronically Signed   By: Sandi Mariscal M.D.   On: 03/18/2021 15:29   [place] IMAGE GUIDED DRAINAGE BY PERCUTANEOUS CATHETER  Result Date: 03/18/2021 INDICATION: History of right-sided rib fractures complicated by development of infected adjacent hematomas, post ultrasound-guided placement of two percutaneous drainage catheters on 03/14/2021. Unfortunately, one of the percutaneous drainage catheters was inadvertently removed with subsequent chest CT performed 03/17/2021 demonstrating a persistent undrained complex fluid collection involving the anterior aspect of the right chest wall. As such, request made for ultrasound-guided aspiration and/or drainage catheter placement for infection source control purposes. EXAM: 1. ULTRASOUND-GUIDED RIGHT CHEST WALL PERCUTANEOUS DRAINAGE CATHETER PLACEMENT X2 2. ULTRASOUND-GUIDED RIGHT CHEST WALL ABSCESS ASPIRATION COMPARISON:  Chest CT-03/17/2021; 03/13/2021; ultrasound-guided right chest wall percutaneous drainage catheter placement x2-03/14/2021 MEDICATIONS: The patient is currently admitted to the hospital and receiving intravenous antibiotics. The antibiotics were administered within an appropriate time frame prior to the initiation  of the procedure. ANESTHESIA/SEDATION: Moderate (conscious) sedation was employed during this procedure. A total of Versed 4 mg and Fentanyl 200 mcg was administered intravenously. Moderate Sedation Time: 41 minutes. The patient's level of consciousness and vital signs were monitored continuously by  radiology nursing throughout the procedure under my direct supervision. CONTRAST:  None COMPLICATIONS: None immediate. PROCEDURE: Informed written consent was obtained from the patient after a discussion of the risks, benefits and alternatives to treatment. Preprocedural ultrasound scanning demonstrated complex fluid involving the anterior aspect of the right chest wall compatible with the findings seen on preceding chest CT. A timeout was performed prior to the initiation of the procedure. The skin overlying the right anterior chest was prepped and draped in the usual sterile fashion. The overlying soft tissues were anesthetized with 1% lidocaine with epinephrine. Under direct ultrasound guidance, a 18 gauge trocar needle was advanced into the complex abscess within the right chest wall, inferior to the nipple. A short Amplatz wire was coiled within the collection. The track was dilated ultimately allowing placement of a 10 French percutaneous catheter. Multiple ultrasound images were saved procedural documentation purposes. Next, approximately 10 cc of purulent fluid was aspirated from the drainage catheter Sonographic evaluation demonstrates a residual complex fluid collection and as such the more medial component of the collection was accessed with an 18 gauge trocar needle. A short Amplatz wire was coiled within the collection and an additional 10 French percutaneous drainage catheter was placed under ultrasound guidance. Multiple ultrasound images were saved procedural documentation purposes. Next, approximately 30 cc of purulent fluid was aspirated from this more medially positioned chest wall drain. A representative sample was capped and sent to the laboratory for analysis. Next, the lateral component of the right chest wall collection was accessed with an 18 gauge trocar needle however only a small amount of bloody fluid was able to be aspirated. As such, a short Amplatz wire was coiled within the  collection and the trocar needle was exchanged for a Yueh sheath catheter which was utilized to aspirate approximately 3 cc of thick bloody fluid. At this time, approximately 70 cc of additional blood-tinged purulent fluid was able to be expressed from the site of the previously removed percutaneous drainage catheter. Drainage catheters were secured at the entrance site within interrupted sutures and drainage catheters were connected to JP bulbs. Dressings were applied. The patient tolerated the procedure well without immediate postprocedural complication. IMPRESSION: 1. Successful ultrasound-guided placement of 2 two additional right anterior chest wall percutaneous drainage catheters yielding a total of 40 cc of purulent fluid. A representative aspirated sample was sent to the laboratory for analysis. 2. Successful ultrasound-guided aspiration of approximately 3 cc of thick bloody fluid from the more lateral component of the right anterior chest wall complex hematoma. 3. Successful bedside expression of approximately 70 cc of purulent fluid from the entrance site of the recently removed percutaneous drainage catheter. Electronically Signed   By: Sandi Mariscal M.D.   On: 03/18/2021 15:29   [place] FINE NEEDLE ASP 1ST LESION  Result Date: 03/18/2021 INDICATION: History of right-sided rib fractures complicated by development of infected adjacent hematomas, post ultrasound-guided placement of two percutaneous drainage catheters on 03/14/2021. Unfortunately, one of the percutaneous drainage catheters was inadvertently removed with subsequent chest CT performed 03/17/2021 demonstrating a persistent undrained complex fluid collection involving the anterior aspect of the right chest wall. As such, request made for ultrasound-guided aspiration and/or drainage catheter placement for infection source control purposes. EXAM: 1. ULTRASOUND-GUIDED RIGHT  CHEST WALL PERCUTANEOUS DRAINAGE CATHETER PLACEMENT X2 2. ULTRASOUND-GUIDED  RIGHT CHEST WALL ABSCESS ASPIRATION COMPARISON:  Chest CT-03/17/2021; 03/13/2021; ultrasound-guided right chest wall percutaneous drainage catheter placement x2-03/14/2021 MEDICATIONS: The patient is currently admitted to the hospital and receiving intravenous antibiotics. The antibiotics were administered within an appropriate time frame prior to the initiation of the procedure. ANESTHESIA/SEDATION: Moderate (conscious) sedation was employed during this procedure. A total of Versed 4 mg and Fentanyl 200 mcg was administered intravenously. Moderate Sedation Time: 41 minutes. The patient's level of consciousness and vital signs were monitored continuously by radiology nursing throughout the procedure under my direct supervision. CONTRAST:  None COMPLICATIONS: None immediate. PROCEDURE: Informed written consent was obtained from the patient after a discussion of the risks, benefits and alternatives to treatment. Preprocedural ultrasound scanning demonstrated complex fluid involving the anterior aspect of the right chest wall compatible with the findings seen on preceding chest CT. A timeout was performed prior to the initiation of the procedure. The skin overlying the right anterior chest was prepped and draped in the usual sterile fashion. The overlying soft tissues were anesthetized with 1% lidocaine with epinephrine. Under direct ultrasound guidance, a 18 gauge trocar needle was advanced into the complex abscess within the right chest wall, inferior to the nipple. A short Amplatz wire was coiled within the collection. The track was dilated ultimately allowing placement of a 10 French percutaneous catheter. Multiple ultrasound images were saved procedural documentation purposes. Next, approximately 10 cc of purulent fluid was aspirated from the drainage catheter Sonographic evaluation demonstrates a residual complex fluid collection and as such the more medial component of the collection was accessed with an 18  gauge trocar needle. A short Amplatz wire was coiled within the collection and an additional 10 French percutaneous drainage catheter was placed under ultrasound guidance. Multiple ultrasound images were saved procedural documentation purposes. Next, approximately 30 cc of purulent fluid was aspirated from this more medially positioned chest wall drain. A representative sample was capped and sent to the laboratory for analysis. Next, the lateral component of the right chest wall collection was accessed with an 18 gauge trocar needle however only a small amount of bloody fluid was able to be aspirated. As such, a short Amplatz wire was coiled within the collection and the trocar needle was exchanged for a Yueh sheath catheter which was utilized to aspirate approximately 3 cc of thick bloody fluid. At this time, approximately 70 cc of additional blood-tinged purulent fluid was able to be expressed from the site of the previously removed percutaneous drainage catheter. Drainage catheters were secured at the entrance site within interrupted sutures and drainage catheters were connected to JP bulbs. Dressings were applied. The patient tolerated the procedure well without immediate postprocedural complication. IMPRESSION: 1. Successful ultrasound-guided placement of 2 two additional right anterior chest wall percutaneous drainage catheters yielding a total of 40 cc of purulent fluid. A representative aspirated sample was sent to the laboratory for analysis. 2. Successful ultrasound-guided aspiration of approximately 3 cc of thick bloody fluid from the more lateral component of the right anterior chest wall complex hematoma. 3. Successful bedside expression of approximately 70 cc of purulent fluid from the entrance site of the recently removed percutaneous drainage catheter. Electronically Signed   By: Sandi Mariscal M.D.   On: 03/18/2021 15:29    (Echo, Carotid, EGD, Colonoscopy, ERCP)    Subjective:   Discharge  Exam: Vitals:   04/10/21 0351 04/10/21 1222  BP: 119/68 121/76  Pulse: 62 80  Resp: 18 19  Temp: 97.6 F (36.4 C) 98.3 F (36.8 C)  SpO2: 99% 100%   Vitals:   04/09/21 1455 04/09/21 2028 04/10/21 0351 04/10/21 1222  BP: 108/74 119/72 119/68 121/76  Pulse: 84 72 62 80  Resp: 17 18 18 19   Temp: 98 F (36.7 C) 98.8 F (37.1 C) 97.6 F (36.4 C) 98.3 F (36.8 C)  TempSrc: Oral Oral Oral   SpO2: 100% 99% 99% 100%  Weight:      Height:        General: Pt is alert, awake, not in acute distress Cardiovascular: RRR, S1/S2 +, no rubs, no gallops Respiratory: CTA bilaterally, no wheezing, no rhonchi Abdominal: Soft, NT, ND, bowel sounds + Extremities: no edema, no cyanosis    The results of significant diagnostics from this hospitalization (including imaging, microbiology, ancillary and laboratory) are listed below for reference.     Microbiology: No results found for this or any previous visit (from the past 240 hour(s)).   Labs: BNP (last 3 results) Recent Labs    03/24/21 0542 03/25/21 0032 03/26/21 0329  BNP 37.7 27.3 [id number]   Basic Metabolic Panel: Recent Labs  Lab 04/07/21 0039  NA 137  K 4.6  CL 100  CO2 29  GLUCOSE 140*  BUN 29*  CREATININE 1.36*  CALCIUM 9.1   Liver Function Tests: No results for input(s): AST, ALT, ALKPHOS, BILITOT, PROT, ALBUMIN in the last 168 hours. No results for input(s): LIPASE, AMYLASE in the last 168 hours. No results for input(s): AMMONIA in the last 168 hours. CBC: Recent Labs  Lab 04/07/21 0039  WBC 5.6  HGB 9.6*  HCT 29.6*  MCV 99.0  PLT 218   Cardiac Enzymes: Recent Labs  Lab 04/07/21 0039  CKTOTAL 42*   BNP: Invalid input(s): POCBNP CBG: Recent Labs  Lab 04/09/21 1223 04/09/21 1830 04/09/21 2026 04/10/21 0617 04/10/21 1221  GLUCAP 118* 122* 125* 109* 180*   D-Dimer No results for input(s): DDIMER in the last 72 hours. Hgb A1c No results for input(s): HGBA1C in the last 72 hours. Lipid  Profile No results for input(s): CHOL, HDL, LDLCALC, TRIG, CHOLHDL, LDLDIRECT in the last 72 hours. Thyroid function studies No results for input(s): TSH, T4TOTAL, T3FREE, THYROIDAB in the last 72 hours.  Invalid input(s): FREET3 Anemia work up No results for input(s): VITAMINB12, FOLATE, FERRITIN, TIBC, IRON, RETICCTPCT in the last 72 hours. Urinalysis    Component Value Date/Time   COLORURINE YELLOW 03/13/2021 2300   APPEARANCEUR CLEAR 03/13/2021 2300   LABSPEC 1.044 (H) 03/13/2021 2300   PHURINE 5.0 03/13/2021 2300   GLUCOSEU 50 (A) 03/13/2021 2300   HGBUR MODERATE (A) 03/13/2021 2300   BILIRUBINUR NEGATIVE 03/13/2021 2300   KETONESUR NEGATIVE 03/13/2021 2300   PROTEINUR 30 (A) 03/13/2021 2300   NITRITE NEGATIVE 03/13/2021 2300   LEUKOCYTESUR NEGATIVE 03/13/2021 2300   Sepsis Labs Invalid input(s): PROCALCITONIN,  WBC,  LACTICIDVEN Microbiology No results found for this or any previous visit (from the past 240 hour(s)).   Time coordinating discharge: 42 minutes.   SIGNED:   Hosie Poisson, MD  [facility] 04/10/2021, 4:17 PM

## 2021-04-10 NOTE — Consult Note (Signed)
[facility] Nurse wound follow up Patient receiving care in [facility] 5W6. Wound type: right chest surgical wound Measurement: 7 cm x 11.8 cm x 1.8 cm with approximately 0.5 cm undermining from 10-12 o'cloxk Wound bed: 100% pink/red except for cellular matrix area Drainage (amount, consistency, odor) serous in canister (light orange tint) Periwound: intact Dressing procedure/placement/frequency: Mepitel and one piece of black foam removed from wound bed. Mepitel and one piece of black foam placed into wound bed. Drape applied, immediate seal obtained. Patient tolerated very well. Val Riles, RN, MSN, CWOCN, CNS-BC, pager [phone]

## 2021-04-10 NOTE — TOC Transition Note (Signed)
Transition of Care [facility]) - CM/SW Discharge Note   Patient Details  Name: Brian Malone MRN: [id number] Date of Birth: 11/25/1951  [facility] CM/SW Contact:  Verdell Carmine, RN Phone Number: 04/10/2021, 9:56 AM   Clinical Narrative:    Confirmed that wound vac will be delivered today, discharge medication via [place]. Nurse reports patient is anxious with the would vac. She will change it over to the home vac when it comes. HH to follow.    Final next level of care: [place] Barriers to Discharge: Continued Medical Work up   Patient Goals and CMS Choice        Discharge Placement                       Discharge Plan and Services   Discharge Planning Services: CM Consult                      HH Arranged: RN [facility] Agency: [place] Date [place]: 04/08/21 Time [place] Agency Contacted: 1000 Representative spoke with at [place]: Adela Lank  Social Determinants of Health ([place]) Interventions     Readmission Risk Interventions No flowsheet data found.

## 2021-04-11 ENCOUNTER — Encounter (HOSPITAL_BASED_OUTPATIENT_CLINIC_OR_DEPARTMENT_OTHER): Payer: Self-pay | Admitting: *Deleted

## 2021-04-11 ENCOUNTER — Emergency Department (HOSPITAL_BASED_OUTPATIENT_CLINIC_OR_DEPARTMENT_OTHER): Payer: Medicare Other

## 2021-04-11 ENCOUNTER — Emergency Department (HOSPITAL_BASED_OUTPATIENT_CLINIC_OR_DEPARTMENT_OTHER)
Admission: EM | Admit: 2021-04-11 | Discharge: 2021-04-11 | Disposition: A | Discharge status: Home / Self Care | Payer: Medicare Other | Attending: Emergency Medicine | Admitting: Emergency Medicine

## 2021-04-11 ENCOUNTER — Other Ambulatory Visit: Payer: Self-pay

## 2021-04-11 DIAGNOSIS — I10 Essential (primary) hypertension: Secondary | ICD-10-CM | POA: Diagnosis not present

## 2021-04-11 DIAGNOSIS — E1140 Type 2 diabetes mellitus with diabetic neuropathy, unspecified: Secondary | ICD-10-CM | POA: Diagnosis not present

## 2021-04-11 DIAGNOSIS — I2510 Atherosclerotic heart disease of native coronary artery without angina pectoris: Secondary | ICD-10-CM | POA: Diagnosis not present

## 2021-04-11 DIAGNOSIS — R112 Nausea with vomiting, unspecified: Secondary | ICD-10-CM | POA: Diagnosis not present

## 2021-04-11 DIAGNOSIS — Z8616 Personal history of COVID-19: Secondary | ICD-10-CM | POA: Insufficient documentation

## 2021-04-11 DIAGNOSIS — Z79899 Other long term (current) drug therapy: Secondary | ICD-10-CM | POA: Diagnosis not present

## 2021-04-11 DIAGNOSIS — R110 Nausea: Secondary | ICD-10-CM

## 2021-04-11 DIAGNOSIS — Z21 Asymptomatic human immunodeficiency virus [HIV] infection status: Secondary | ICD-10-CM | POA: Insufficient documentation

## 2021-04-11 DIAGNOSIS — T887XXA Unspecified adverse effect of drug or medicament, initial encounter: Secondary | ICD-10-CM

## 2021-04-11 LAB — COMPREHENSIVE METABOLIC PANEL
ALT: 14 U/L (ref 0–44)
AST: 24 U/L (ref 15–41)
Albumin: 3.7 g/dL (ref 3.5–5.0)
Alkaline Phosphatase: 98 U/L (ref 38–126)
Anion gap: 12 (ref 5–15)
BUN: 42 mg/dL — ABNORMAL HIGH (ref 8–23)
CO2: 24 mmol/L (ref 22–32)
Calcium: 9.3 mg/dL (ref 8.9–10.3)
Chloride: 97 mmol/L — ABNORMAL LOW (ref 98–111)
Creatinine, Ser: 1.35 mg/dL — ABNORMAL HIGH (ref 0.61–1.24)
GFR, Estimated: 58 mL/min — ABNORMAL LOW (ref >60)
Glucose, Bld: 164 mg/dL — ABNORMAL HIGH (ref 70–99)
Potassium: 4.7 mmol/L (ref 3.5–5.1)
Sodium: 133 mmol/L — ABNORMAL LOW (ref 135–145)
Total Bilirubin: 0.5 mg/dL (ref 0.3–1.2)
Total Protein: 8.1 g/dL (ref 6.5–8.1)

## 2021-04-11 LAB — CBC WITH DIFFERENTIAL/PLATELET
Abs Immature Granulocytes: 0.01 10*3/uL (ref 0.00–0.07)
Basophils Absolute: 0 10*3/uL (ref 0.0–0.1)
Basophils Relative: 0 %
Eosinophils Absolute: 0.1 10*3/uL (ref 0.0–0.5)
Eosinophils Relative: 2 %
HCT: 32.8 % — ABNORMAL LOW (ref 39.0–52.0)
Hemoglobin: 10.6 g/dL — ABNORMAL LOW (ref 13.0–17.0)
Immature Granulocytes: 0 %
Lymphocytes Relative: 20 %
Lymphs Abs: 1.4 10*3/uL (ref 0.7–4.0)
MCH: 31.7 pg (ref 26.0–34.0)
MCHC: 32.3 g/dL (ref 30.0–36.0)
MCV: 98.2 fL (ref 80.0–100.0)
Monocytes Absolute: 0.8 10*3/uL (ref 0.1–1.0)
Monocytes Relative: 12 %
Neutro Abs: 4.5 10*3/uL (ref 1.7–7.7)
Neutrophils Relative %: 66 %
Platelets: 266 10*3/uL (ref 150–400)
RBC: 3.34 MIL/uL — ABNORMAL LOW (ref 4.22–5.81)
RDW: 14.7 % (ref 11.5–15.5)
WBC: 6.8 10*3/uL (ref 4.0–10.5)
nRBC: 0 % (ref 0.0–0.2)

## 2021-04-11 LAB — LIPASE, BLOOD: Lipase: 23 U/L (ref 11–51)

## 2021-04-11 MED ORDER — LORAZEPAM 2 MG/ML IJ SOLN
1.0000 mg | Freq: Once | INTRAMUSCULAR | Status: AC
  Administered 2021-04-11: 1 mg via INTRAVENOUS
  Filled 2021-04-11: qty 1

## 2021-04-11 MED ORDER — ONDANSETRON HCL 4 MG/2ML IJ SOLN
4.0000 mg | Freq: Once | INTRAMUSCULAR | Status: AC
  Administered 2021-04-11: 4 mg via INTRAVENOUS
  Filled 2021-04-11: qty 2

## 2021-04-11 MED ORDER — SODIUM CHLORIDE 0.9 % IV BOLUS
1000.0000 mL | Freq: Once | INTRAVENOUS | Status: AC
  Administered 2021-04-11: 1000 mL via INTRAVENOUS

## 2021-04-11 MED ORDER — DIPHENHYDRAMINE HCL 50 MG/ML IJ SOLN
25.0000 mg | Freq: Once | INTRAMUSCULAR | Status: AC
  Administered 2021-04-11: 25 mg via INTRAVENOUS
  Filled 2021-04-11: qty 1

## 2021-04-11 MED ORDER — HYDROMORPHONE HCL 1 MG/ML IJ SOLN
1.0000 mg | Freq: Once | INTRAMUSCULAR | Status: AC
Start: 2021-04-11 — End: 2021-04-11
  Administered 2021-04-11: 1 mg via INTRAVENOUS
  Filled 2021-04-11: qty 1

## 2021-04-11 MED ORDER — PROCHLORPERAZINE EDISYLATE 10 MG/2ML IJ SOLN
10.0000 mg | Freq: Once | INTRAMUSCULAR | Status: AC
  Administered 2021-04-11: 10 mg via INTRAVENOUS
  Filled 2021-04-11: qty 2

## 2021-04-11 MED ORDER — ONDANSETRON 4 MG PO TBDP: ORAL_TABLET | ORAL | 0 refills | Status: DC

## 2021-04-11 MED ORDER — FENTANYL 25 MCG/HR TD PT72: 1.0000 | MEDICATED_PATCH | TRANSDERMAL | 0 refills | Status: DC

## 2021-04-11 NOTE — ED Triage Notes (Addendum)
Pt reports he was d/c'ed from [facility] last night. States he was admitted for septic shock. Has wound vac present to right ribs. States he took 2 doses of new antibiotic they sent home home with and he has been feeling dizzy and nauseated

## 2021-04-11 NOTE — ED Provider Notes (Signed)
[facility] EMERGENCY DEPARTMENT Provider Note   CSN: [id number] Arrival date & time: 04/11/21  1843     History Chief Complaint  Patient presents with  . Nausea    Brian Malone is a 70 y.o. male history of HIV, hypertension, chronic pain, right chest wall abscess with osteomyelitis with wound VAC here presenting with nausea and vomiting.  Patient was in the hospital for about a month or so. Patient eventually had a wound VAC to treat the right chest wall osteomyelitis and finish 2 weeks of IV daptomycin and is on Zyvox.  He was just discharged yesterday.  He states that he was switched to Zyvox yesterday.  He states that he had some nausea in the hospital yesterday.  He states that after he went home he has worsening nausea and vomiting today.  He was concerned about his pain regimen.  He was prescribed Percocet 10 mg.  He apparently was on fentanyl patch in the hospital but did not get a refill.  He also has been anxious and has been taking his Klonopin with minimal relief.  The history is provided by the patient.       Past Medical History:  Diagnosis Date  . Acute subdural hematoma ([place]) 10/28/2019  . Anxiety   . Cellulitis of right upper extremity 11/04/2020  . Diabetes mellitus without complication ([place])   . History of COVID-19 05/26/2020  . HIV (human immunodeficiency virus infection) ([place])   . Hypertension   . Renal disorder     Patient Active Problem List   Diagnosis Date Noted  . Osteomyelitis ([place])   . Protein-calorie malnutrition, severe 03/19/2021  . Sepsis ([place]) 03/13/2021  . Abscess of chest wall 03/13/2021  . Mixed diabetic hyperlipidemia associated with type 2 diabetes mellitus ([place]) 03/13/2021  . GERD without esophagitis 03/13/2021  . BPH (benign prostatic hyperplasia) 03/13/2021  . Chronic hepatitis C without hepatic coma ([place]) 03/13/2021  . Malnutrition of moderate degree 11/05/2020  . Weakness of right upper extremity 11/04/2020  . Abscess  11/04/2020  . CAD (coronary artery disease) 05/26/2020  . Dysphagia 06/20/2019  . AKI (acute kidney injury) ([place]) 06/20/2019  . HCAP (healthcare-associated pneumonia) 06/20/2019  . Lactic acidosis 06/20/2019  . Cervical spinal stenosis 05/18/2019  . Lumbar spondylosis 05/18/2019  . Sacroiliitis ([place]) 05/15/2019  . HIV (human immunodeficiency virus infection) ([place]) 05/13/2019  . Wound infection after surgery 04/15/2019  . Benign prostatic hyperplasia (BPH) with straining on urination 02/22/2019  . Essential hypertension 02/22/2019  . Post-traumatic osteoarthritis of both ankles 02/22/2019  . Trigeminal neuralgia 02/22/2019  . Uncontrolled type 2 diabetes mellitus with hyperglycemia ([place]) 02/22/2019  . Chronic bilateral low back pain with bilateral sciatica 01/19/2019  . Diabetic autonomic neuropathy associated with type 2 diabetes mellitus ([place]) 01/19/2019  . Migraine with aura and without status migrainosus, not intractable 01/19/2019  . Neck pain 01/19/2019  . Neuropathy of both feet 01/19/2019  . Nocturia more than twice per night 01/07/2015  . Anxiety disorder 10/23/2014  . Depression 10/23/2014  . Insomnia 10/23/2014  . Rash of entire body 10/23/2014    Past Surgical History:  Procedure Laterality Date  . ANKLE ARTHROSCOPY    . APPENDECTOMY    . APPLICATION OF WOUND VAC Right 03/25/2021   Procedure: APPLICATION OF WOUND VAC;  Surgeon: Lajuana Matte, MD;  [facility];  Service: Thoracic;  Laterality: Right;  . BACK SURGERY     FUSION  . INCISION AND DRAINAGE Right 03/25/2021  Procedure: INCISION AND DRAINAGE OF CHEST WALL;  Surgeon: Lajuana Matte, MD;  Location[facility];  Service: Thoracic;  Laterality: Right;  . INCISION AND DRAINAGE ABSCESS Right 03/19/2021   Procedure: INCISION AND DRAINAGE RIGHT CHEST ABSCESS;  Surgeon: Lajuana Matte, MD;  Location: [facility];  Service: Vascular;  Laterality: Right;  . IR US GUIDE BX ASP/DRAIN  03/14/2021  . IR US GUIDE  BX ASP/DRAIN  03/14/2021  . PENILE PROSTHESIS  REMOVAL  05/2020   Removal due to abscess  . PENILE PROSTHESIS PLACEMENT  04/2020  . THORACOTOMY Right 2008  ?  . TONSILLECTOMY         Family History  Problem Relation Age of Onset  . CAD Mother   . Lung cancer Mother   . CAD Father   . Lung cancer Father   . CAD Brother     Social History   Tobacco Use  . Smoking status: Never Smoker  . Smokeless tobacco: Never Used  Vaping Use  . Vaping Use: Never used  Substance Use Topics  . Alcohol use: Not Currently  . Drug use: Never    Home Medications Prior to Admission medications   Medication Sig Start Date End Date Taking? Authorizing Provider  BELBUCA 150 MCG FILM Place 1 Film inside cheek every 12 (twelve) hours. 03/06/21   [provider]  busPIRone (BUSPAR) 30 MG tablet Take 30 mg by mouth 2 (two) times daily. 03/04/21   [provider]  clonazePAM (KLONOPIN) 0.5 MG tablet Take 0.5 mg by mouth 3 (three) times daily. 10/01/19   [provider]  ezetimibe (ZETIA) 10 MG tablet Take 10 mg by mouth daily.    [provider]  feeding supplement (ENSURE ENLIVE / ENSURE PLUS) LIQD Take 237 mLs by mouth 2 (two) times daily between meals. 04/10/21   Hosie Poisson, MD  feeding supplement, GLUCERNA SHAKE, (GLUCERNA SHAKE) LIQD Take 237 mLs by mouth daily. 04/10/21   Hosie Poisson, MD  linezolid (ZYVOX) 600 MG tablet Take 1 tablet (600 mg total) by mouth 2 (two) times daily for 21 days. Start taking 03/05/21. 04/10/21 05/01/21  Hosie Poisson, MD  loratadine (CLARITIN) 10 MG tablet Take 10 mg by mouth daily.     [provider]  MAVYRET 100-40 MG TABS Take 3 tablets by mouth daily. 03/04/21   [provider]  metoprolol tartrate (LOPRESSOR) 25 MG tablet Take 1 tablet (25 mg total) by mouth 2 (two) times daily. 04/10/21   Hosie Poisson, MD  Multiple Vitamin (MULTIVITAMIN WITH MINERALS) TABS tablet Take 1 tablet by mouth daily. 04/10/21   Hosie Poisson, MD  Nutritional Supplements (,FEEDING SUPPLEMENT, PROSOURCE PLUS) liquid Take 30 mLs by mouth 2 (two) times daily as needed (Provide for additional protein as pt requests). 04/10/21   Hosie Poisson, MD  oxyCODONE-acetaminophen (PERCOCET) 10-325 MG tablet Take 1 tablet by mouth every 4 (four) hours as needed for up to 6 days for pain. 04/10/21 04/16/21  Hosie Poisson, MD  pantoprazole (PROTONIX) 40 MG tablet Take 40 mg by mouth daily before breakfast.  02/12/19   [provider]  Semaglutide,0.25 or 0.5MG /DOS, 2 MG/1.5ML SOPN Inject 0.5 mg into the skin every Friday.  10/08/19   [provider]  senna-docusate (SENOKOT-S) 8.6-50 MG tablet Take 2 tablets by mouth at bedtime as needed for mild constipation. 04/10/21   Hosie Poisson, MD  tamsulosin (FLOMAX) 0.4 MG CAPS capsule Take 0.4 mg by mouth at bedtime.  02/12/19   [provider]  tiZANidine (ZANAFLEX) 4 MG tablet Take 4 mg by mouth at bedtime.  03/08/19   [provider]  TRIUMEQ 600-50-300 MG tablet Take 1 tablet by mouth daily.  02/12/19   [provider]  valACYclovir (VALTREX) 1000 MG tablet Take 1,000 mg by mouth 3 (three) times daily as needed (as directed for trigeminal neuralgia flares). Trig neuralgia flare 10/16/19   [provider]    Allergies    Adhesive [tape], Cyclobenzaprine, Duloxetine hcl, and Morphine  Review of Systems   Review of Systems  Gastrointestinal: Positive for vomiting.  All other systems reviewed and are negative.   Physical Exam Updated Vital Signs BP 130/79   Pulse 81   Temp 98.5 F (36.9 C) (Oral)   Resp 15   Ht 6' (1.829 m)   Wt 74.8 kg   SpO2 100%   BMI 22.38 kg/m   Physical Exam Vitals and nursing note reviewed.  Constitutional:      Comments: Anxious, slightly dehydrated  HENT:     Head: Normocephalic.     Nose: Nose normal.     Mouth/Throat:     Mouth: Mucous membranes are dry.  Eyes:     Extraocular Movements: Extraocular  movements intact.     Pupils: Pupils are equal, round, and reactive to light.  Cardiovascular:     Rate and Rhythm: Normal rate and regular rhythm.     Pulses: Normal pulses.     Heart sounds: Normal heart sounds.  Pulmonary:     Effort: Pulmonary effort is normal.     Breath sounds: Normal breath sounds.     Comments: Wound VAC to the right chest wall in place Abdominal:     General: Abdomen is flat.     Palpations: Abdomen is soft.  Musculoskeletal:        General: Normal range of motion.     Cervical back: Normal range of motion and neck supple.  Skin:    General: Skin is warm.     Capillary Refill: Capillary refill takes less than 2 seconds.  Neurological:     General: No focal deficit present.  Psychiatric:        Mood and Affect: Mood normal.        Behavior: Behavior normal.     ED Results / Procedures / Treatments   Labs (all labs ordered are listed, but only abnormal results are displayed) Labs Reviewed  CBC WITH DIFFERENTIAL/PLATELET - Abnormal; Notable for the following components:      Result Value   RBC 3.34 (*)    Hemoglobin 10.6 (*)    HCT 32.8 (*)    All other components within normal limits  COMPREHENSIVE METABOLIC PANEL - Abnormal; Notable for the following components:   Sodium 133 (*)    Chloride 97 (*)    Glucose, Bld 164 (*)    BUN 42 (*)    Creatinine, Ser 1.35 (*)    GFR, Estimated 58 (*)    All other components within normal limits  LIPASE, BLOOD    EKG EKG Interpretation  Date/Time:  Saturday April 11 2021 18:52:25 EDT Ventricular Rate:  88 PR Interval:  133 QRS Duration: 88 QT Interval:  366 QTC Calculation: 443 R Axis:   51 Text Interpretation: Sinus rhythm No significant change since last tracing Confirmed by Wandra Arthurs [id number]) on 04/11/2021 7:28:43 PM   Radiology DG Chest Port 1 View  Result Date: 04/11/2021 CLINICAL DATA:  Nausea, right chest wall  wound VAC. EXAM: PORTABLE CHEST 1 VIEW COMPARISON:  Chest radiograph dated  04/10/2021. FINDINGS: The heart size and mediastinal contours are within normal limits. Both lungs are clear. Fixation hardware overlies the lower cervical spine. IMPRESSION: No active disease. Electronically Signed   By: Zerita Boers M.D.   On: 04/11/2021 20:15   DG CHEST PORT 1 VIEW  Result Date: 04/10/2021 CLINICAL DATA:  Chest pain.  Follow-up. EXAM: PORTABLE CHEST 1 VIEW COMPARISON:  Radiograph 03/13/2021.  CT 03/22/2021 FINDINGS: No acute airspace disease. Stable heart size and mediastinal contours. Previous small pleural effusions on CT are not appreciated by radiograph. No pulmonary edema or pneumothorax. No acute osseous abnormalities are seen. IMPRESSION: No acute pulmonary process. Electronically Signed   By: Keith Rake M.D.   On: 04/10/2021 18:01    Procedures Procedures   Medications Ordered in ED Medications  HYDROmorphone (DILAUDID) injection 1 mg (1 mg Intravenous Given 04/11/21 1940)  prochlorperazine (COMPAZINE) injection 10 mg (10 mg Intravenous Given 04/11/21 1940)  diphenhydrAMINE (BENADRYL) injection 25 mg (25 mg Intravenous Given 04/11/21 1940)  sodium chloride 0.9 % bolus 1,000 mL (1,000 mLs Intravenous New Bag/Given 04/11/21 1939)  LORazepam (ATIVAN) injection 1 mg (1 mg Intravenous Given 04/11/21 1940)  ondansetron (ZOFRAN) injection 4 mg (4 mg Intravenous Given 04/11/21 1940)    ED Course  I have reviewed the triage vital signs and the nursing notes.  Pertinent labs & imaging results that were available during my care of the patient were reviewed by me and considered in my medical decision making (see chart for details).    MDM Rules/Calculators/A&P                         Brian Malone is a 70 y.o. male history of osteomyelitis to the right chest wall with wound VAC and on Zyvox here presenting with vomiting.  Patient was just discharged from the hospital yesterday.  I wonder if he had side effect from Zyvox versus withdrawing from pain meds versus  dehydration.  Plan to get labs and hydrate patient and give pain medicine and ativan  9:19 PM Labs unchanged from yesterday.  Chest x-ray unremarkable.  Patient felt better after pain meds and IV fluids and Ativan.  I wonder if he is withdrawing from the fentanyl patch that was removed.  He does have a pain medicine doctor but states that he is allowed to get prescriptions since he was in the hospital for a month.  Plan to represcribe several fentanyl patches until he can see his pain management doctor this week.  He does have oxycodone and Klonopin at home.  We will also prescribe some nausea medicine as well.    Final Clinical Impression(s) / ED Diagnoses Final diagnoses:  None    Rx / DC Orders ED Discharge Orders    None       Drenda Freeze, MD 04/11/21 2120

## 2021-04-11 NOTE — Discharge Instructions (Signed)
You likely have a medication side effect and may be withdrawal from fentanyl.  Please use and no patch as prescribed  Take Zofran with your Zyvox  See your doctor this week.  You also need to follow-up with your pain management doctor  Return to ER if you have worse nausea or vomiting or abdominal pain or fever

## 2021-04-13 ENCOUNTER — Telehealth: Payer: Self-pay

## 2021-04-13 DIAGNOSIS — M8610 Other acute osteomyelitis, unspecified site: Secondary | ICD-10-CM

## 2021-04-13 NOTE — Telephone Encounter (Signed)
Received call from [facility] stating that patient has stopped taking antibiotics since Saturday. States that he was seen at ED same day for nausea, vision changes, and heart palpations.  Spoke with patient who states that he did stop taking antibiotics. Patient states that he is not able to tolerate IV zyvox. Would like for MD to prescribe alternative. Will forward message to MD to advise on the following concern Heather,Rn  P: [place], RMA

## 2021-04-13 NOTE — Telephone Encounter (Signed)
Patient left voicemail stating he was having difficulty tolerating the linezolid. RN attempted to call patient back to further assess symptoms, no answer. Left HIPAA compliant voicemail requesting callback.   Beryle Flock, RN

## 2021-04-14 ENCOUNTER — Other Ambulatory Visit (HOSPITAL_COMMUNITY): Payer: Self-pay

## 2021-04-14 MED ORDER — DOXYCYCLINE HYCLATE 100 MG PO TABS: 100.0000 mg | ORAL_TABLET | Freq: Two times a day (BID) | ORAL | 0 refills | Status: DC

## 2021-04-14 NOTE — Telephone Encounter (Signed)
Patient was on oral Zyvox. Will send in doxycyline to pharmacy as requested. Patient understands he must keep upcoming appt.

## 2021-04-14 NOTE — Telephone Encounter (Signed)
Please advise on picc line.

## 2021-04-14 NOTE — Addendum Note (Signed)
Addended by: Leatrice Jewels on: 04/14/2021 10:59 AM   Modules accepted: Orders

## 2021-04-17 ENCOUNTER — Other Ambulatory Visit: Payer: Self-pay

## 2021-04-17 ENCOUNTER — Ambulatory Visit (INDEPENDENT_AMBULATORY_CARE_PROVIDER_SITE_OTHER): Payer: Medicare Other | Admitting: Thoracic Surgery (Cardiothoracic Vascular Surgery)

## 2021-04-17 VITALS — BP 170/83 | HR 77 | Resp 20 | Ht 72.0 in | Wt 166.0 lb

## 2021-04-17 DIAGNOSIS — L02213 Cutaneous abscess of chest wall: Secondary | ICD-10-CM

## 2021-04-17 DIAGNOSIS — Z4689 Encounter for fitting and adjustment of other specified devices: Secondary | ICD-10-CM | POA: Diagnosis not present

## 2021-04-17 NOTE — Progress Notes (Signed)
      [address]       [place] [address]             [phone]        Brian Malone  Medical Record #[id number] Date of Birth: 12/31/1950  Referring: Malone, Brian Huguenin, MD Primary Care: Brian Malone, Brian Grief, MD Primary Cardiologist:Brian Stann Mainland, MD  Reason for visit:   follow-up  History of Present Illness:     Overall doing well.  Brian Malone presents for wound check.  He has been tolerating VAC changes without much difficulty.  Physical Exam: BP (!) 170/83   Pulse 77   Resp 20   Ht 6' (1.829 m)   Wt 166 lb (75.3 kg)   SpO2 98% Comment: RA  BMI 22.51 kg/m   There is good granulation tissue and the wound has filled in nicely.  There is some residual mesh present in the wound at the most cranial portion.  The rest of the mesh was incorporated.  The wound measures 8 x 11 cm.       Assessment / Plan:   70 year old male with history of HIV who is status post chest wall debridement for an abscess likely due to rib fractures.  The wound is healing quite well and is filling in nicely.  Based on the size of the wound I think that he will need a split-thickness skin graft, and I have referred him to  Dr. Marla Roe to help facilitate this.  I will see him back in 1 month for wound check.  Lajuana Matte 04/17/2021 5:53 PM

## 2021-04-18 LAB — FUNGUS CULTURE WITH STAIN

## 2021-04-18 LAB — FUNGUS CULTURE RESULT

## 2021-04-18 LAB — FUNGAL ORGANISM REFLEX

## 2021-04-21 ENCOUNTER — Encounter: Payer: Self-pay | Admitting: Plastic Surgery

## 2021-04-21 ENCOUNTER — Ambulatory Visit (INDEPENDENT_AMBULATORY_CARE_PROVIDER_SITE_OTHER): Payer: Medicare Other | Admitting: Plastic Surgery

## 2021-04-21 ENCOUNTER — Other Ambulatory Visit: Payer: Self-pay

## 2021-04-21 DIAGNOSIS — S21101A Unspecified open wound of right front wall of thorax without penetration into thoracic cavity, initial encounter: Secondary | ICD-10-CM | POA: Diagnosis not present

## 2021-04-21 DIAGNOSIS — I2510 Atherosclerotic heart disease of native coronary artery without angina pectoris: Secondary | ICD-10-CM | POA: Diagnosis not present

## 2021-04-21 NOTE — Progress Notes (Signed)
Patient ID: Brian Malone, male    DOB: 07-Feb-1951, 70 y.o.   MRN: [id number]   Chief Complaint  Patient presents with   Advice Only   Skin Problem    The patient is a 70 year old male here with his caregiver for evaluation of his right chest wound.  Wound is 8 x 11 cm and granulating according to Dr. Abran Duke note.  Today he has the [facility] on that was just placed so we did not remove it.  He has a history of HIV.  He fell several months ago and had a large hematoma to the right chest area.  He went to the OR on 3/31 and 4/6 for debridement and placement of Maitri Derm.  He showed me several pictures.  There appears to be good granulation tissue and no sign of active infection.    Review of Systems  Constitutional: Negative.   HENT: Negative.   Eyes: Negative.   Respiratory: Positive for chest tightness. Negative for shortness of breath.   Cardiovascular: Negative.   Gastrointestinal: Negative.   Endocrine: Negative.   Genitourinary: Negative.   Musculoskeletal: Positive for back pain.  Hematological: Negative.   Psychiatric/Behavioral: Negative.     Past Medical History:  Diagnosis Date   Acute subdural hematoma ([place]) 10/28/2019   Anxiety    Cellulitis of right upper extremity 11/04/2020   Diabetes mellitus without complication ([place])    History of COVID-19 05/26/2020   HIV (human immunodeficiency virus infection) ([place])    Hypertension    Renal disorder     Past Surgical History:  Procedure Laterality Date   ANKLE ARTHROSCOPY     APPENDECTOMY     APPLICATION OF WOUND VAC Right 03/25/2021   Procedure: APPLICATION OF WOUND VAC;  Surgeon: Lajuana Matte, MD;  Location: [facility];  Service: Thoracic;  Laterality: Right;   BACK SURGERY     FUSION   INCISION AND DRAINAGE Right 03/25/2021   Procedure: INCISION AND DRAINAGE OF CHEST WALL;  Surgeon: Lajuana Matte, MD;  Location: [facility];  Service: Thoracic;  Laterality: Right;   INCISION AND DRAINAGE ABSCESS Right  03/19/2021   Procedure: INCISION AND DRAINAGE RIGHT CHEST ABSCESS;  Surgeon: Lajuana Matte, MD;  Location: [facility];  Service: Vascular;  Laterality: Right;   IR US GUIDE BX ASP/DRAIN  03/14/2021   IR US GUIDE BX ASP/DRAIN  03/14/2021   PENILE PROSTHESIS  REMOVAL  05/2020   Removal due to abscess   PENILE PROSTHESIS PLACEMENT  04/2020   THORACOTOMY Right 2008  ?   TONSILLECTOMY        Current Outpatient Medications:    busPIRone (BUSPAR) 30 MG tablet, Take 30 mg by mouth 2 (two) times daily., Disp: , Rfl:    clonazePAM (KLONOPIN) 0.5 MG tablet, Take 0.5 mg by mouth 3 (three) times daily., Disp: , Rfl:    ezetimibe (ZETIA) 10 MG tablet, Take 10 mg by mouth daily., Disp: , Rfl:    feeding supplement (ENSURE ENLIVE / ENSURE PLUS) LIQD, Take 237 mLs by mouth 2 (two) times daily between meals., Disp: 237 mL, Rfl: 12   feeding supplement, GLUCERNA SHAKE, (GLUCERNA SHAKE) LIQD, Take 237 mLs by mouth daily., Disp: 237 mL, Rfl: 12   fentaNYL (DURAGESIC) 25 MCG/HR, Place 1 patch onto the skin every 3 (three) days., Disp: 5 patch, Rfl: 0   loratadine (CLARITIN) 10 MG tablet, Take 10 mg by mouth daily. , Disp: , Rfl:    Nutritional Supplements (,  FEEDING SUPPLEMENT, PROSOURCE PLUS) liquid, Take 30 mLs by mouth 2 (two) times daily as needed (Provide for additional protein as pt requests)., Disp: 887 mL, Rfl: 3   ondansetron (ZOFRAN ODT) 4 MG disintegrating tablet, 4mg  ODT q4 hours prn nausea/vomit, Disp: 10 tablet, Rfl: 0   pantoprazole (PROTONIX) 40 MG tablet, Take 40 mg by mouth daily before breakfast. , Disp: , Rfl:    Semaglutide,0.25 or 0.5MG /DOS, 2 MG/1.5ML SOPN, Inject 0.5 mg into the skin every Friday. , Disp: , Rfl:    senna-docusate (SENOKOT-S) 8.6-50 MG tablet, Take 2 tablets by mouth at bedtime as needed for mild constipation., Disp: , Rfl:    tamsulosin (FLOMAX) 0.4 MG CAPS capsule, Take 0.4 mg by mouth at bedtime. , Disp: , Rfl:    tiZANidine (ZANAFLEX) 4 MG tablet, Take 4 mg by mouth at  bedtime. , Disp: , Rfl:    TRIUMEQ 600-50-300 MG tablet, Take 1 tablet by mouth daily. , Disp: , Rfl:    doxycycline (VIBRA-TABS) 100 MG tablet, Take 1 tablet (100 mg total) by mouth 2 (two) times daily., Disp: 60 tablet, Rfl: 0   oxyCODONE-acetaminophen (PERCOCET) 10-325 MG tablet, Take 1 tablet by mouth every 6 (six) hours as needed., Disp: , Rfl:    Objective:   Vitals:   04/21/21 1244  BP: (!) 148/81  Pulse: 73  SpO2: 98%    Physical Exam Vitals and nursing note reviewed.  Constitutional:      Appearance: Normal appearance.  HENT:     Head: Normocephalic and atraumatic.  Cardiovascular:     Pulses: Normal pulses.  Pulmonary:     Effort: Pulmonary effort is normal.     Breath sounds: No stridor.  Chest:     Chest wall: Tenderness present.  Abdominal:     General: Abdomen is flat. There is no distension.     Tenderness: There is no abdominal tenderness.  Musculoskeletal:        General: No swelling or deformity.  Skin:    General: Skin is warm.     Capillary Refill: Capillary refill takes less than 2 seconds.     Coloration: Skin is not jaundiced.     Findings: No bruising.  Neurological:     Mental Status: He is alert and oriented to person, place, and time.  Psychiatric:        Mood and Affect: Mood normal.        Thought Content: Thought content normal.     Assessment & Plan:  Open wound of right chest wall, initial encounter  Plan for operative debridement and skin graft.    [place], DO

## 2021-04-24 ENCOUNTER — Ambulatory Visit (INDEPENDENT_AMBULATORY_CARE_PROVIDER_SITE_OTHER): Payer: Medicare Other | Admitting: Infectious Diseases

## 2021-04-24 ENCOUNTER — Encounter: Payer: Self-pay | Admitting: Plastic Surgery

## 2021-04-24 ENCOUNTER — Encounter: Payer: Self-pay | Admitting: Infectious Diseases

## 2021-04-24 ENCOUNTER — Other Ambulatory Visit: Payer: Self-pay

## 2021-04-24 VITALS — BP 100/69 | HR 90 | Temp 97.4°F | Wt 165.4 lb

## 2021-04-24 DIAGNOSIS — M8610 Other acute osteomyelitis, unspecified site: Secondary | ICD-10-CM

## 2021-04-24 DIAGNOSIS — Z21 Asymptomatic human immunodeficiency virus [HIV] infection status: Secondary | ICD-10-CM

## 2021-04-24 DIAGNOSIS — B20 Human immunodeficiency virus [HIV] disease: Secondary | ICD-10-CM

## 2021-04-24 DIAGNOSIS — B182 Chronic viral hepatitis C: Secondary | ICD-10-CM | POA: Diagnosis not present

## 2021-04-24 DIAGNOSIS — L02213 Cutaneous abscess of chest wall: Secondary | ICD-10-CM

## 2021-04-24 MED ORDER — DOXYCYCLINE HYCLATE 100 MG PO TABS: 100.0000 mg | ORAL_TABLET | Freq: Two times a day (BID) | ORAL | 0 refills | Status: AC

## 2021-04-24 NOTE — Progress Notes (Signed)
[facility]                                                             [place], [place], [place], [address]                                                                  Phn. [phone]; Fax: [phone]                                                                             Date: 04/24/21  Reason for Referral: HFU for chest wall abscess  Assessment Problem List Items Addressed This Visit      Digestive   Chronic hepatitis C without hepatic coma ([place])     Musculoskeletal and Integument   Osteomyelitis ([place])   Relevant Medications   doxycycline (VIBRA-TABS) 100 MG tablet     Other   HIV (human immunodeficiency virus infection) ([place])   Abscess of chest wall - Primary     Right chest wall abscess status post ultrasound-guided drain placement by IR 3/26 and chest wall debridement and wound VAC placement 3/31 by CV surgery.  Cultures positive for MRSA. Status post repeat chest wall debridement and wound VAC change 4/6.  Anterior right sixth rib osteomyelitis  HIV well-controlled, HIV RNA undetectable, follows up with a provider at [facility], on Triumeq  Hepatitis C-completed 8 weeks of mavyret. Follows up with a provider at [facility]  Has completed 3 weeks of IV daptomycin on 4/21 followed by 1 day of Linezolid then Doxycycline from 4/27.  Continue Doxycyline for 2 more weeks. Tentative end date for 6 weeks is 5/18.  Patient recently had a CBC and CMP at PCP a couple of days before which was unremarkable.  Fu in 2-3 weeks  Fu with plastics   All questions and concerns were discussed and addressed. Patient verbalized understanding of the plan. ____________________________________________________________________________________________________________________  HPI: 70 YO male with PMH of Hepatitis C, HIV, HTN, HLD, DM2 who is here for a HFU after recent  hospital admission ( 3/25-4/22) for rt chest wall abscess/osteomyelitis. Seen by myself in the hospital and Ct SX. Patient had ultrasound-guided drain placement by IR 3/26 and chest wall debridement and wound VAC placement 3/31 by CV surgery.  Cultures positive for MRSA. Underwent repeat chest wall debridement and wound VAC change 4/6. Patient was discharged on 4/22 after receiving 3 weeks of IV daptomycin on Line zolid for remaining 3 weeks. Patient had a ED visit one day following discharge on 4/23 with vomiting with concerns for possible side effects related to Linezolid vs withdrawal from fentanyl patch. He was managed after conservative management with IVF, analgesics. He called [facility] office with request to switch antibiotics and hence I  recommended to start taking Doxycyline which he started taking from 4/27. He has been taking Doxycycline 100mg  PO BID from 4/27 and has been tolerating it well without any issues. Recently seen by [facility] 4/29 where the chest wound was thought to be healing well. He was referred to plastics for possible need of split thickness skin graft. He was seen by Plastics Dr Marla Roe on 5/3 and patient says he has been told for skin graft in next couple of weeks.   He showed me pictures of his chest wall wound which looks non infected. Denies any significant pain/tenderness/swelling from the wound site. Denies any fevers, chills and night sweats. No issues with the antibiotics.   He has completed tx for HCV, taking triumeq for HIV daily. Follows with [facility] provider at [facility] where he intends to continue his HCV and HIV care.   ROS: Constitutional: Negative for fever, chills, activity change, appetite change, fatigue and unexpected weight change.  HENT: Negative for congestion, sore throat, rhinorrhea, sneezing, trouble swallowing and sinus pressure.  Eyes: Negative for photophobia and visual disturbance.  Respiratory: Negative for cough, chest tightness, shortness  of breath, wheezing and stridor.  Cardiovascular: Negative for chest pain, palpitations and leg swelling.  Gastrointestinal: Negative for nausea, vomiting, abdominal pain, diarrhea, constipation, blood in stool, abdominal distention and anal bleeding.  Genitourinary: Negative for dysuria, hematuria, flank pain and difficulty urinating.  Musculoskeletal: Negative for myalgias, back pain, joint swelling, arthralgias and gait problem.  Skin: Negative for color change, pallor, rash and wound.  Neurological: Negative for dizziness, tremors, weakness and light-headedness.  Hematological: Negative for adenopathy. Does not bruise/bleed easily.  Psychiatric/Behavioral: Negative for behavioral problems, confusion, sleep disturbance, dysphoric mood, decreased concentration and agitation.   Past Medical History:  Diagnosis Date  . Acute subdural hematoma ([place]) 10/28/2019  . Anxiety   . Cellulitis of right upper extremity 11/04/2020  . Diabetes mellitus without complication ([place])   . History of COVID-19 05/26/2020  . HIV (human immunodeficiency virus infection) ([place])   . Hypertension   . Renal disorder    Past Surgical History:  Procedure Laterality Date  . ANKLE ARTHROSCOPY    . APPENDECTOMY    . APPLICATION OF WOUND VAC Right 03/25/2021   Procedure: APPLICATION OF WOUND VAC;  Surgeon: Lajuana Matte, MD;  Location: [facility];  Service: Thoracic;  Laterality: Right;  . BACK SURGERY     FUSION  . INCISION AND DRAINAGE Right 03/25/2021   Procedure: INCISION AND DRAINAGE OF CHEST WALL;  Surgeon: Lajuana Matte, MD;  Location: [facility];  Service: Thoracic;  Laterality: Right;  . INCISION AND DRAINAGE ABSCESS Right 03/19/2021   Procedure: INCISION AND DRAINAGE RIGHT CHEST ABSCESS;  Surgeon: Lajuana Matte, MD;  Location: [facility];  Service: Vascular;  Laterality: Right;  . IR US GUIDE BX ASP/DRAIN  03/14/2021  . IR US GUIDE BX ASP/DRAIN  03/14/2021  . PENILE PROSTHESIS  REMOVAL  05/2020   Removal  due to abscess  . PENILE PROSTHESIS PLACEMENT  04/2020  . THORACOTOMY Right 2008  ?  . TONSILLECTOMY     Current Outpatient Medications on File Prior to Visit  Medication Sig Dispense Refill  . busPIRone (BUSPAR) 30 MG tablet Take 30 mg by mouth 2 (two) times daily.    . clonazePAM (KLONOPIN) 0.5 MG tablet Take 0.5 mg by mouth 3 (three) times daily.    Marland Kitchen ezetimibe (ZETIA) 10 MG tablet Take 10 mg by mouth daily.    Marland Kitchen  feeding supplement (ENSURE ENLIVE / ENSURE PLUS) LIQD Take 237 mLs by mouth 2 (two) times daily between meals. 237 mL 12  . feeding supplement, GLUCERNA SHAKE, (GLUCERNA SHAKE) LIQD Take 237 mLs by mouth daily. 237 mL 12  . fentaNYL (DURAGESIC) 25 MCG/HR Place 1 patch onto the skin every 3 (three) days. 5 patch 0  . loratadine (CLARITIN) 10 MG tablet Take 10 mg by mouth daily.     . Nutritional Supplements (,FEEDING SUPPLEMENT, PROSOURCE PLUS) liquid Take 30 mLs by mouth 2 (two) times daily as needed (Provide for additional protein as pt requests). 887 mL 3  . ondansetron (ZOFRAN ODT) 4 MG disintegrating tablet 4mg  ODT q4 hours prn nausea/vomit 10 tablet 0  . pantoprazole (PROTONIX) 40 MG tablet Take 40 mg by mouth daily before breakfast.     . Semaglutide,0.25 or 0.5MG /DOS, 2 MG/1.5ML SOPN Inject 0.5 mg into the skin every Friday.     . senna-docusate (SENOKOT-S) 8.6-50 MG tablet Take 2 tablets by mouth at bedtime as needed for mild constipation.    . tamsulosin (FLOMAX) 0.4 MG CAPS capsule Take 0.4 mg by mouth at bedtime.     Marland Kitchen tiZANidine (ZANAFLEX) 4 MG tablet Take 4 mg by mouth at bedtime.     . TRIUMEQ 600-50-300 MG tablet Take 1 tablet by mouth daily.     Marland Kitchen oxyCODONE-acetaminophen (PERCOCET) 10-325 MG tablet Take 1 tablet by mouth every 6 (six) hours as needed.     No current facility-administered medications on file prior to visit.   Allergies  Allergen Reactions  . Adhesive [Tape] Other (See Comments)    "Tape will take off my skin, as will Band-Aids"  .  Cyclobenzaprine Other (See Comments)    Hallucinations  . Duloxetine Hcl Other (See Comments)    Makes PTSD- induced nightmares more vivid   . Morphine Itching and Other (See Comments)    Hallucinations, aggression, and makes patient altered also    . Pregabalin Anxiety    Pt reports insomnia and worsening anxiety    Social History   Socioeconomic History  . Marital status: Divorced    Spouse name: Not on file  . Number of children: 2  . Years of education: Not on file  . Highest education level: Bachelor's degree (e.g., BA, AB, BS)  Occupational History  . Not on file  Tobacco Use  . Smoking status: Never Smoker  . Smokeless tobacco: Never Used  Vaping Use  . Vaping Use: Never used  Substance and Sexual Activity  . Alcohol use: Not Currently  . Drug use: Never  . Sexual activity: Not on file  Other Topics Concern  . Not on file  Social History Narrative   Pt is divorced   2 children   Right handed   Drinks soda, no coffee, no tea    Lives on the third floor of an apartment bldg. One story studio apartment   Social Determinants of Health   Financial Resource Strain: Not on file  Food Insecurity: Not on file  Transportation Needs: Not on file  Physical Activity: Not on file  Stress: Not on file  Social Connections: Not on file  Intimate Partner Violence: Not on file     Vitals BP 100/69   Pulse 90   Temp (!) 97.4 F (36.3 C) (Oral)   Wt 165 lb 6.4 oz (75 kg)   SpO2 99%   BMI 22.43 kg/m    Examination  General - not in acute distress, comfortably  sitting in chair HEENT - PEERLA, no pallor and no icterus Chest - b/l clear air entry, no additional sounds CVS- Normal s1s2, RRR Abdomen - Soft, Non tender , non distended Ext- no pedal edema Neuro: grossly normal Back - WNL Psych : calm and cooperative Skin:         Recent labs CBC Latest Ref Rng & Units 04/11/2021 04/07/2021 03/31/2021  WBC 4.0 - 10.5 K/uL 6.8 5.6 5.2  Hemoglobin 13.0 - 17.0  g/dL 10.6(L) 9.6(L) 9.8(L)  Hematocrit 39.0 - 52.0 % 32.8(L) 29.6(L) 30.1(L)  Platelets 150 - 400 K/uL 266 218 271   CMP Latest Ref Rng & Units 04/11/2021 04/07/2021 03/31/2021  Glucose 70 - 99 mg/dL 164(H) 140(H) 128(H)  BUN 8 - 23 mg/dL 42(H) 29(H) 33(H)  Creatinine 0.61 - 1.24 mg/dL 1.35(H) 1.36(H) 1.38(H)  Sodium 135 - 145 mmol/L 133(L) 137 136  Potassium 3.5 - 5.1 mmol/L 4.7 4.6 4.8  Chloride 98 - 111 mmol/L 97(L) 100 97(L)  CO2 22 - 32 mmol/L 24 29 29   Calcium 8.9 - 10.3 mg/dL 9.3 9.1 9.2  Total Protein 6.5 - 8.1 g/dL 8.1 - -  Total Bilirubin 0.3 - 1.2 mg/dL 0.5 - -  Alkaline Phos 38 - 126 U/L 98 - -  AST 15 - 41 U/L 24 - -  ALT 0 - 44 U/L 14 - -    Pertinent Microbiology Results for orders placed or performed during the hospital encounter of 03/13/21  Urine culture     Status: None   Collection Time: 03/13/21 12:25 AM   Specimen: In/Out Cath Urine  Result Value Ref Range Status   Specimen Description IN/OUT CATH URINE  Final   Special Requests NONE  Final   Culture   Final    NO GROWTH Performed at [facility], [address] [place], [place] [address]    Report Status 03/15/2021 FINAL  Final  Blood Culture (routine x 2)     Status: None   Collection Time: 03/13/21 12:25 PM   Specimen: Right Antecubital; Blood  Result Value Ref Range Status   Specimen Description   Final    RIGHT ANTECUBITAL BLOOD Performed at [facility], [address] [place], [place] [address]    Special Requests   Final    BOTTLES DRAWN AEROBIC AND ANAEROBIC Blood Culture results may not be optimal due to an inadequate volume of blood received in culture bottles Performed at [name], [address] [place], [place] [address]    Culture   Final    NO GROWTH 5 DAYS Performed at [facility], [place] [address] [place], [place] [address]    Report Status 03/18/2021 FINAL  Final  Blood Culture (routine x 2)     Status: None   Collection Time: 03/13/21   1:15 PM   Specimen: BLOOD RIGHT HAND  Result Value Ref Range Status   Specimen Description   Final    BLOOD RIGHT HAND BLOOD Performed at [facility], [address] [place], [place] [address]    Special Requests   Final    BOTTLES DRAWN AEROBIC AND ANAEROBIC Blood Culture results may not be optimal due to an inadequate volume of blood received in culture bottles Performed at [facility], [address] [place], [place] [address]    Culture   Final    NO GROWTH 5 DAYS Performed at [facility],  [address] [place], [place] [address]    Report Status 03/18/2021 FINAL  Final  Resp Panel by RT-PCR (Flu A&B, Covid) Nasopharyngeal Swab     Status: None   Collection Time: 03/13/21  2:01 PM   Specimen: Nasopharyngeal Swab; Nasopharyngeal(NP) swabs in vial transport medium  Result Value Ref Range Status   SARS Coronavirus 2 by RT PCR NEGATIVE NEGATIVE Final    Comment: (NOTE) SARS-CoV-2 target nucleic acids are NOT DETECTED.  The SARS-CoV-2 RNA is generally detectable in upper respiratory specimens during the acute phase of infection. The lowest concentration of SARS-CoV-2 viral copies this assay can detect is 138 copies/mL. A negative result does not preclude SARS-Cov-2 infection and should not be used as the sole basis for treatment or other patient management decisions. A negative result may occur with  improper specimen collection/handling, submission of specimen other than nasopharyngeal swab, presence of viral mutation(s) within the areas targeted by this assay, and inadequate number of viral copies(<138 copies/mL). A negative result must be combined with clinical observations, patient history, and epidemiological information. The expected result is Negative.  Fact Sheet for Patients:  EntrepreneurPulse.com.au  Fact Sheet for Healthcare Providers:  IncredibleEmployment.be  This test is no t yet approved or  cleared by the [place] FDA and  has been authorized for detection and/or diagnosis of SARS-CoV-2 by FDA under an Emergency Use Authorization (EUA). This EUA will remain  in effect (meaning this test can be used) for the duration of the COVID-19 declaration under Section 564(b)(1) of the Act, 21 U.S.C.section 360bbb-3(b)(1), unless the authorization is terminated  or revoked sooner.       Influenza A by PCR NEGATIVE NEGATIVE Final   Influenza B by PCR NEGATIVE NEGATIVE Final    Comment: (NOTE) The Xpert Xpress SARS-CoV-2/FLU/RSV plus assay is intended as an aid in the diagnosis of influenza from Nasopharyngeal swab specimens and should not be used as a sole basis for treatment. Nasal washings and aspirates are unacceptable for Xpert Xpress SARS-CoV-2/FLU/RSV testing.  Fact Sheet for Patients: EntrepreneurPulse.com.au  Fact Sheet for Healthcare Providers: IncredibleEmployment.be  This test is not yet approved or cleared by the [place] FDA and has been authorized for detection and/or diagnosis of SARS-CoV-2 by FDA under an Emergency Use Authorization (EUA). This EUA will remain in effect (meaning this test can be used) for the duration of the COVID-19 declaration under Section 564(b)(1) of the Act, 21 U.S.C. section 360bbb-3(b)(1), unless the authorization is terminated or revoked.  Performed at [facility], [address] [place], [place] [address]   MRSA PCR Screening     Status: Abnormal   Collection Time: 03/13/21 11:51 PM   Specimen: Nasal Mucosa; Nasopharyngeal  Result Value Ref Range Status   MRSA by PCR POSITIVE (A) NEGATIVE Final    Comment:        The GeneXpert MRSA Assay (FDA approved for NASAL specimens only), is one component of a comprehensive MRSA colonization surveillance program. It is not intended to diagnose MRSA infection nor to guide or monitor treatment for MRSA infections. RESULT CALLED  TO, READ BACK BY AND VERIFIED WITHDorna Bloom RN 03/14/21 0432 JDW Performed at [facility] [address], [place], [place] [address]   Aerobic/Anaerobic Culture (surgical/deep wound)     Status: None   Collection Time: 03/14/21  1:23 PM   Specimen: Abscess  Result Value Ref Range Status   Specimen Description ABSCESS  Final   Special Requests NONE  Final   Gram Stain   Final    ABUNDANT WBC PRESENT, PREDOMINANTLY PMN ABUNDANT GRAM POSITIVE COCCI IN CLUSTERS    Culture   Final    ABUNDANT METHICILLIN RESISTANT STAPHYLOCOCCUS AUREUS NO ANAEROBES ISOLATED Performed at [facility], [address] [place], [place] [address]    Report Status 03/19/2021 FINAL  Final   Organism ID, Bacteria METHICILLIN RESISTANT STAPHYLOCOCCUS AUREUS  Final      Susceptibility   Methicillin resistant staphylococcus aureus - MIC*    CIPROFLOXACIN >=8 RESISTANT Resistant     ERYTHROMYCIN >=8 RESISTANT Resistant     GENTAMICIN <=0.5 SENSITIVE Sensitive     OXACILLIN >=4 RESISTANT Resistant     TETRACYCLINE <=1 SENSITIVE Sensitive     VANCOMYCIN <=0.5 SENSITIVE Sensitive     TRIMETH/SULFA >=320 RESISTANT Resistant     CLINDAMYCIN >=8 RESISTANT Resistant     RIFAMPIN <=0.5 SENSITIVE Sensitive     Inducible Clindamycin NEGATIVE Sensitive     * ABUNDANT METHICILLIN RESISTANT STAPHYLOCOCCUS AUREUS  Aerobic/Anaerobic Culture w Gram Stain (surgical/deep wound)     Status: None   Collection Time: 03/18/21  1:38 PM   Specimen: Abscess  Result Value Ref Range Status   Specimen Description ABSCESS  Final   Special Requests DRAIN CHEST WALL  Final   Gram Stain   Final    ABUNDANT WBC PRESENT, PREDOMINANTLY PMN ABUNDANT GRAM POSITIVE COCCI IN CLUSTERS    Culture   Final    ABUNDANT METHICILLIN RESISTANT STAPHYLOCOCCUS AUREUS NO ANAEROBES ISOLATED Performed at [facility], [address], [place], [place] [address]    Report Status 03/24/2021 FINAL  Final   Organism ID, Bacteria  METHICILLIN RESISTANT STAPHYLOCOCCUS AUREUS  Final      Susceptibility   Methicillin resistant staphylococcus aureus - MIC*    CIPROFLOXACIN >=8 RESISTANT Resistant     ERYTHROMYCIN >=8 RESISTANT Resistant     GENTAMICIN <=0.5 SENSITIVE Sensitive     OXACILLIN >=4 RESISTANT Resistant     TETRACYCLINE <=1 SENSITIVE Sensitive     VANCOMYCIN <=0.5 SENSITIVE Sensitive     TRIMETH/SULFA >=320 RESISTANT Resistant     CLINDAMYCIN >=8 RESISTANT Resistant     RIFAMPIN <=0.5 SENSITIVE Sensitive     Inducible Clindamycin NEGATIVE Sensitive     * ABUNDANT METHICILLIN RESISTANT STAPHYLOCOCCUS AUREUS  Fungus Culture With Stain     Status: None   Collection Time: 03/19/21  3:07 PM   Specimen: Wound; Abscess  Result Value Ref Range Status   Fungus Stain Final report  Final   Fungus (Mycology) Culture Final report  Final    Comment: (NOTE) Performed At: [facility] [place], [place] [id number] Rush Farmer MD NU:[id number]    Fungal Source ABSCESS  Final    Comment: RIGHT CHEST Performed at [facility], [place] [address] [place], [place] [address]   Aerobic/Anaerobic Culture w Gram Stain (surgical/deep wound)     Status: None   Collection Time: 03/19/21  3:07 PM   Specimen: Wound; Abscess  Result Value Ref Range Status   Specimen Description ABSCESS RIGHT CHEST  Final   Special Requests NONE  Final   Gram Stain   Final    FEW WBC PRESENT, PREDOMINANTLY PMN FEW GRAM POSITIVE COCCI IN PAIRS IN CLUSTERS    Culture   Final    FEW METHICILLIN RESISTANT STAPHYLOCOCCUS AUREUS NO ANAEROBES ISOLATED Performed at [facility], [place] [address] [place], [place] [address]  Report Status 03/24/2021 FINAL  Final   Organism ID, Bacteria METHICILLIN RESISTANT STAPHYLOCOCCUS AUREUS  Final      Susceptibility   Methicillin resistant staphylococcus aureus - MIC*    CIPROFLOXACIN >=8 RESISTANT Resistant     ERYTHROMYCIN >=8 RESISTANT Resistant     GENTAMICIN <=0.5  SENSITIVE Sensitive     OXACILLIN >=4 RESISTANT Resistant     TETRACYCLINE <=1 SENSITIVE Sensitive     VANCOMYCIN 1 SENSITIVE Sensitive     TRIMETH/SULFA >=320 RESISTANT Resistant     CLINDAMYCIN >=8 RESISTANT Resistant     RIFAMPIN <=0.5 SENSITIVE Sensitive     Inducible Clindamycin NEGATIVE Sensitive     * FEW METHICILLIN RESISTANT STAPHYLOCOCCUS AUREUS  Fungus Culture Result     Status: None   Collection Time: 03/19/21  3:07 PM  Result Value Ref Range Status   Result 1 Comment  Final    Comment: (NOTE) KOH/Calcofluor preparation:  no fungus observed. Performed At: [facility] [place], [place] [id number] Rush Farmer MD BR:[id number]   Fungal organism reflex     Status: None   Collection Time: 03/19/21  3:07 PM  Result Value Ref Range Status   Fungal result 1 Comment  Final    Comment: (NOTE) No yeast or mold isolated after 4 weeks. Performed At: [facility] [place], [place] [id number] Rush Farmer MD YV:[id number]    Pertinent Imaging All pertinent labs/Imagings/notes reviewed. All pertinent plain films and CT images have been personally visualized and interpreted; radiology reports have been reviewed. Decision making incorporated into the Impression / Recommendations.  I have spent 60 minutes for this patient encounter including  review of prior medical records with greater than 50% of time in face to face counsel of the patient/discussing diagnostics and plan of care.   Electronically signed by:  Rosiland Oz, MD Infectious Disease Physician [facility] [address] [place], [place] [address] Phone: [phone]  Fax: [phone]

## 2021-04-27 ENCOUNTER — Encounter: Payer: Self-pay | Admitting: Plastic Surgery

## 2021-04-30 ENCOUNTER — Encounter: Payer: Self-pay | Admitting: Plastic Surgery

## 2021-04-30 DIAGNOSIS — S21109A Unspecified open wound of unspecified front wall of thorax without penetration into thoracic cavity, initial encounter: Secondary | ICD-10-CM | POA: Insufficient documentation

## 2021-05-01 ENCOUNTER — Encounter: Admitting: Surgical

## 2021-05-04 ENCOUNTER — Other Ambulatory Visit: Payer: Self-pay

## 2021-05-04 ENCOUNTER — Encounter: Payer: Self-pay | Admitting: Surgical

## 2021-05-04 ENCOUNTER — Ambulatory Visit (INDEPENDENT_AMBULATORY_CARE_PROVIDER_SITE_OTHER): Payer: Medicare Other | Admitting: Surgical

## 2021-05-04 VITALS — BP 109/70 | HR 81 | Ht 72.0 in | Wt 165.2 lb

## 2021-05-04 DIAGNOSIS — S21101A Unspecified open wound of right front wall of thorax without penetration into thoracic cavity, initial encounter: Secondary | ICD-10-CM

## 2021-05-04 MED ORDER — ONDANSETRON HCL 4 MG PO TABS: 4.0000 mg | ORAL_TABLET | Freq: Three times a day (TID) | ORAL | 0 refills | Status: DC | PRN

## 2021-05-04 NOTE — H&P (View-Only) (Signed)
Patient ID: Brian Malone, male    DOB: 24-Jul-1951, 70 y.o.   MRN: [id number]  Chief Complaint  Patient presents with  . Pre-op Exam      ICD-10-CM   1. Open wound of right chest wall, initial encounter  S21.101A     History of Present Illness: Brian Malone is a 70 y.o.  male  with a history of right chest wound that he developed after debridement after drainage of large chest wall hematoma/abscess.  He presents for preoperative evaluation for upcoming procedure, right chest wound excision with possible skin graft and matriDerm, scheduled for 05/14/2021 with Dr. Marla Roe.  The patient has not had problems with anesthesia. No history of DVT/PE.  No family history of DVT/PE.  No family or personal history of bleeding or clotting disorders.  Patient is not currently taking any blood thinners.  No history of CVA/MI.   PMH Significant for: Hypertension, GERD, gout, HIV, hyperlipidemia, OSA,  PTSD, diabetes mellitus, coronary artery disease  Patient is currently on fentanyl patches and Percocet daily which she takes for the right chest wall/rib pain that he developed after his injury which resulted in the right chest wall wound.  Patient had a culture of his right chest wall abscess on 03/19/2021 which showed MRSA. Patient had A1c checked on 03/14/2021 which was 8.3. Patient reports overall he is feeling well, denies any chest pain or shortness of breath.  He reports that he does have daily pain in his right chest wall where he had his injury/infection that he uses fentanyl patches for daily.  He reports that he occasionally uses the Percocet as needed.  Past Medical History: Allergies: Allergies  Allergen Reactions  . Adhesive [Tape] Other (See Comments)    "Tape will take off my skin, as will Band-Aids"  . Cyclobenzaprine Other (See Comments)    Hallucinations  . Duloxetine Hcl Other (See Comments)    Makes PTSD- induced nightmares more vivid   . Morphine Itching and Other  (See Comments)    Hallucinations, aggression, and makes patient altered also    . Pregabalin Anxiety    Pt reports insomnia and worsening anxiety     Current Medications:  Current Outpatient Medications:  .  busPIRone (BUSPAR) 30 MG tablet, Take 30 mg by mouth 2 (two) times daily., Disp: , Rfl:  .  clonazePAM (KLONOPIN) 0.5 MG tablet, Take 0.5 mg by mouth 3 (three) times daily., Disp: , Rfl:  .  doxycycline (VIBRA-TABS) 100 MG tablet, Take 1 tablet (100 mg total) by mouth 2 (two) times daily., Disp: 60 tablet, Rfl: 0 .  ezetimibe (ZETIA) 10 MG tablet, Take 10 mg by mouth daily., Disp: , Rfl:  .  feeding supplement (ENSURE ENLIVE / ENSURE PLUS) LIQD, Take 237 mLs by mouth 2 (two) times daily between meals., Disp: 237 mL, Rfl: 12 .  feeding supplement, GLUCERNA SHAKE, (GLUCERNA SHAKE) LIQD, Take 237 mLs by mouth daily., Disp: 237 mL, Rfl: 12 .  fentaNYL (DURAGESIC) 25 MCG/HR, Place 1 patch onto the skin every 3 (three) days., Disp: 5 patch, Rfl: 0 .  loratadine (CLARITIN) 10 MG tablet, Take 10 mg by mouth daily. , Disp: , Rfl:  .  Nutritional Supplements (,FEEDING SUPPLEMENT, PROSOURCE PLUS) liquid, Take 30 mLs by mouth 2 (two) times daily as needed (Provide for additional protein as pt requests)., Disp: 887 mL, Rfl: 3 .  ondansetron (ZOFRAN ODT) 4 MG disintegrating tablet, 4mg  ODT q4 hours prn nausea/vomit, Disp: 10 tablet,  Rfl: 0 .  ondansetron (ZOFRAN) 4 MG tablet, Take 1 tablet (4 mg total) by mouth every 8 (eight) hours as needed for nausea or vomiting., Disp: 20 tablet, Rfl: 0 .  oxyCODONE-acetaminophen (PERCOCET) 10-325 MG tablet, Take 1 tablet by mouth every 6 (six) hours as needed., Disp: , Rfl:  .  pantoprazole (PROTONIX) 40 MG tablet, Take 40 mg by mouth daily before breakfast. , Disp: , Rfl:  .  propranolol ER (INDERAL LA) 60 MG 24 hr capsule, Take 60 mg by mouth daily., Disp: , Rfl:  .  Semaglutide,0.25 or 0.5MG /DOS, 2 MG/1.5ML SOPN, Inject 0.5 mg into the skin every Friday. ,  Disp: , Rfl:  .  senna-docusate (SENOKOT-S) 8.6-50 MG tablet, Take 2 tablets by mouth at bedtime as needed for mild constipation., Disp: , Rfl:  .  tamsulosin (FLOMAX) 0.4 MG CAPS capsule, Take 0.4 mg by mouth at bedtime. , Disp: , Rfl:  .  tiZANidine (ZANAFLEX) 4 MG tablet, Take 4 mg by mouth at bedtime. , Disp: , Rfl:  .  TRIUMEQ 600-50-300 MG tablet, Take 1 tablet by mouth daily. , Disp: , Rfl:   Past Medical Problems: Past Medical History:  Diagnosis Date  . Acute subdural hematoma ([place]) 10/28/2019  . Anxiety   . Cellulitis of right upper extremity 11/04/2020  . Diabetes mellitus without complication ([place])   . History of COVID-19 05/26/2020  . HIV (human immunodeficiency virus infection) ([place])   . Hypertension   . Renal disorder     Past Surgical History: Past Surgical History:  Procedure Laterality Date  . ANKLE ARTHROSCOPY    . APPENDECTOMY    . APPLICATION OF WOUND VAC Right 03/25/2021   Procedure: APPLICATION OF WOUND VAC;  Surgeon: Lajuana Matte, MD;  Location: [place];  Service: Thoracic;  Laterality: Right;  . BACK SURGERY     FUSION  . INCISION AND DRAINAGE Right 03/25/2021   Procedure: INCISION AND DRAINAGE OF CHEST WALL;  Surgeon: Lajuana Matte, MD;  Location: [place];  Service: Thoracic;  Laterality: Right;  . INCISION AND DRAINAGE ABSCESS Right 03/19/2021   Procedure: INCISION AND DRAINAGE RIGHT CHEST ABSCESS;  Surgeon: Lajuana Matte, MD;  Location: [place];  Service: Vascular;  Laterality: Right;  . IR US GUIDE BX ASP/DRAIN  03/14/2021  . IR US GUIDE BX ASP/DRAIN  03/14/2021  . PENILE PROSTHESIS  REMOVAL  05/2020   Removal due to abscess  . PENILE PROSTHESIS PLACEMENT  04/2020  . THORACOTOMY Right 2008  ?  . TONSILLECTOMY      Social History: Social History   Socioeconomic History  . Marital status: Divorced    Spouse name: Not on file  . Number of children: 2  . Years of education: Not on file  . Highest education level: Bachelor's degree (e.g.,  BA, AB, BS)  Occupational History  . Not on file  Tobacco Use  . Smoking status: Never Smoker  . Smokeless tobacco: Never Used  Vaping Use  . Vaping Use: Never used  Substance and Sexual Activity  . Alcohol use: Not Currently  . Drug use: Never  . Sexual activity: Not on file  Other Topics Concern  . Not on file  Social History Narrative   Pt is divorced   2 children   Right handed   Drinks soda, no coffee, no tea    Lives on the third floor of an apartment bldg. One story studio apartment   Social Determinants of Health  Financial Resource Strain: Not on file  Food Insecurity: Not on file  Transportation Needs: Not on file  Physical Activity: Not on file  Stress: Not on file  Social Connections: Not on file  Intimate Partner Violence: Not on file    Family History: Family History  Problem Relation Age of Onset  . CAD Mother   . Lung cancer Mother   . CAD Father   . Lung cancer Father   . CAD Brother     Review of Systems: Review of Systems  Constitutional: Negative.   Respiratory: Negative for shortness of breath.   Cardiovascular: Negative for chest pain, palpitations and leg swelling.  Gastrointestinal: Negative.   Neurological: Negative.     Physical Exam: Vital Signs BP 109/70 (BP Location: Left Arm, Patient Position: Sitting, Cuff Size: Normal)   Pulse 81   Ht 6' (1.829 m)   Wt 165 lb 3.2 oz (74.9 kg)   SpO2 98%   BMI 22.41 kg/m   Physical Exam Constitutional:      General: Not in acute distress.    Appearance: Normal appearance. Not ill-appearing.  HENT:     Head: Normocephalic and atraumatic.  Eyes:     Pupils: Pupils are equal, round Neck:     Musculoskeletal: Normal range of motion.  Cardiovascular:     Rate and Rhythm: Normal rate    Pulses: Normal pulses.  Pulmonary:     Effort: Pulmonary effort is normal. No respiratory distress.  Abdominal:     General: Abdomen is flat. There is no distension.  Musculoskeletal: Normal range  of motion.  Skin:    General: Skin is warm and dry.  Right chest wall wound VAC in place with good suction noted.  No surrounding erythema.    Findings: No erythema or rash.  Neurological:     General: No focal deficit present.     Mental Status: Alert and oriented to person, place, and time. Mental status is at baseline.     Motor: No weakness.  Psychiatric:        Mood and Affect: Mood normal.        Behavior: Behavior normal.    Assessment/Plan: The patient is scheduled for debridement of right chest wall wound, possible skin graft and possible application of MaitriDerm with Dr. Marla Roe.  Risks, benefits, and alternatives of procedure discussed, questions answered and consent obtained.    Smoking Status: Non-smoker; Counseling Given?  N/A  Caprini Score: 4, moderate; Risk Factors include: Age, and length of planned surgery. Recommendation for mechanical and pharmacological prophylaxis while hospitalized. Encourage early ambulation.   Post-op Rx sent to pharmacy: Zofran.  Patient is currently on fentanyl patches every 24 and Percocet every 6 hours as needed.  He reports that he is comfortable continuing this through the postoperative period.  He has a history of kidney disease and cannot tolerate additional ibuprofen.  Patient was provided with the General Surgical Risk consent document and Pain Medication Agreement prior to their appointment.  They had adequate time to read through the risk consent documents and Pain Medication Agreement. We also discussed them in person together during this preop appointment. All of their questions were answered to their satisfaction.  Recommended calling if they have any further questions.  Risk consent form and Pain Medication Agreement to be scanned into patient's chart.  The risks that can be encountered with and after a skin graft were discussed and include the following but not limited to these: bleeding, infection, delayed healing,  anesthesia  risks, skin sensation changes, injury to structures including nerves, blood vessels, and muscles which may be temporary or permanent, allergies to tape, suture materials and glues, blood products, topical preparations or injected agents, skin contour irregularities, skin discoloration and swelling, deep vein thrombosis, cardiac and pulmonary complications, pain, which may persist, failure of the graft and possible need for revisional surgery or staged procedures.   Electronically signed by: Tyshika Baldridge J Hazelene Doten, PA-C 05/04/2021 1:12 PM 

## 2021-05-04 NOTE — Progress Notes (Signed)
Patient ID: Brian Malone, male    DOB: 24-Jul-1951, 70 y.o.   MRN: [id number]  Chief Complaint  Patient presents with  . Pre-op Exam      ICD-10-CM   1. Open wound of right chest wall, initial encounter  S21.101A     History of Present Illness: Brian Malone is a 70 y.o.  male  with a history of right chest wound that he developed after debridement after drainage of large chest wall hematoma/abscess.  He presents for preoperative evaluation for upcoming procedure, right chest wound excision with possible skin graft and matriDerm, scheduled for 05/14/2021 with Dr. Marla Roe.  The patient has not had problems with anesthesia. No history of DVT/PE.  No family history of DVT/PE.  No family or personal history of bleeding or clotting disorders.  Patient is not currently taking any blood thinners.  No history of CVA/MI.   PMH Significant for: Hypertension, GERD, gout, HIV, hyperlipidemia, OSA,  PTSD, diabetes mellitus, coronary artery disease  Patient is currently on fentanyl patches and Percocet daily which she takes for the right chest wall/rib pain that he developed after his injury which resulted in the right chest wall wound.  Patient had a culture of his right chest wall abscess on 03/19/2021 which showed MRSA. Patient had A1c checked on 03/14/2021 which was 8.3. Patient reports overall he is feeling well, denies any chest pain or shortness of breath.  He reports that he does have daily pain in his right chest wall where he had his injury/infection that he uses fentanyl patches for daily.  He reports that he occasionally uses the Percocet as needed.  Past Medical History: Allergies: Allergies  Allergen Reactions  . Adhesive [Tape] Other (See Comments)    "Tape will take off my skin, as will Band-Aids"  . Cyclobenzaprine Other (See Comments)    Hallucinations  . Duloxetine Hcl Other (See Comments)    Makes PTSD- induced nightmares more vivid   . Morphine Itching and Other  (See Comments)    Hallucinations, aggression, and makes patient altered also    . Pregabalin Anxiety    Pt reports insomnia and worsening anxiety     Current Medications:  Current Outpatient Medications:  .  busPIRone (BUSPAR) 30 MG tablet, Take 30 mg by mouth 2 (two) times daily., Disp: , Rfl:  .  clonazePAM (KLONOPIN) 0.5 MG tablet, Take 0.5 mg by mouth 3 (three) times daily., Disp: , Rfl:  .  doxycycline (VIBRA-TABS) 100 MG tablet, Take 1 tablet (100 mg total) by mouth 2 (two) times daily., Disp: 60 tablet, Rfl: 0 .  ezetimibe (ZETIA) 10 MG tablet, Take 10 mg by mouth daily., Disp: , Rfl:  .  feeding supplement (ENSURE ENLIVE / ENSURE PLUS) LIQD, Take 237 mLs by mouth 2 (two) times daily between meals., Disp: 237 mL, Rfl: 12 .  feeding supplement, GLUCERNA SHAKE, (GLUCERNA SHAKE) LIQD, Take 237 mLs by mouth daily., Disp: 237 mL, Rfl: 12 .  fentaNYL (DURAGESIC) 25 MCG/HR, Place 1 patch onto the skin every 3 (three) days., Disp: 5 patch, Rfl: 0 .  loratadine (CLARITIN) 10 MG tablet, Take 10 mg by mouth daily. , Disp: , Rfl:  .  Nutritional Supplements (,FEEDING SUPPLEMENT, PROSOURCE PLUS) liquid, Take 30 mLs by mouth 2 (two) times daily as needed (Provide for additional protein as pt requests)., Disp: 887 mL, Rfl: 3 .  ondansetron (ZOFRAN ODT) 4 MG disintegrating tablet, 4mg  ODT q4 hours prn nausea/vomit, Disp: 10 tablet,  Rfl: 0 .  ondansetron (ZOFRAN) 4 MG tablet, Take 1 tablet (4 mg total) by mouth every 8 (eight) hours as needed for nausea or vomiting., Disp: 20 tablet, Rfl: 0 .  oxyCODONE-acetaminophen (PERCOCET) 10-325 MG tablet, Take 1 tablet by mouth every 6 (six) hours as needed., Disp: , Rfl:  .  pantoprazole (PROTONIX) 40 MG tablet, Take 40 mg by mouth daily before breakfast. , Disp: , Rfl:  .  propranolol ER (INDERAL LA) 60 MG 24 hr capsule, Take 60 mg by mouth daily., Disp: , Rfl:  .  Semaglutide,0.25 or 0.5MG /DOS, 2 MG/1.5ML SOPN, Inject 0.5 mg into the skin every Friday. ,  Disp: , Rfl:  .  senna-docusate (SENOKOT-S) 8.6-50 MG tablet, Take 2 tablets by mouth at bedtime as needed for mild constipation., Disp: , Rfl:  .  tamsulosin (FLOMAX) 0.4 MG CAPS capsule, Take 0.4 mg by mouth at bedtime. , Disp: , Rfl:  .  tiZANidine (ZANAFLEX) 4 MG tablet, Take 4 mg by mouth at bedtime. , Disp: , Rfl:  .  TRIUMEQ 600-50-300 MG tablet, Take 1 tablet by mouth daily. , Disp: , Rfl:   Past Medical Problems: Past Medical History:  Diagnosis Date  . Acute subdural hematoma ([place]) 10/28/2019  . Anxiety   . Cellulitis of right upper extremity 11/04/2020  . Diabetes mellitus without complication ([place])   . History of COVID-19 05/26/2020  . HIV (human immunodeficiency virus infection) ([place])   . Hypertension   . Renal disorder     Past Surgical History: Past Surgical History:  Procedure Laterality Date  . ANKLE ARTHROSCOPY    . APPENDECTOMY    . APPLICATION OF WOUND VAC Right 03/25/2021   Procedure: APPLICATION OF WOUND VAC;  Surgeon: Lajuana Matte, MD;  Location: [place];  Service: Thoracic;  Laterality: Right;  . BACK SURGERY     FUSION  . INCISION AND DRAINAGE Right 03/25/2021   Procedure: INCISION AND DRAINAGE OF CHEST WALL;  Surgeon: Lajuana Matte, MD;  Location: [place];  Service: Thoracic;  Laterality: Right;  . INCISION AND DRAINAGE ABSCESS Right 03/19/2021   Procedure: INCISION AND DRAINAGE RIGHT CHEST ABSCESS;  Surgeon: Lajuana Matte, MD;  Location: [place];  Service: Vascular;  Laterality: Right;  . IR US GUIDE BX ASP/DRAIN  03/14/2021  . IR US GUIDE BX ASP/DRAIN  03/14/2021  . PENILE PROSTHESIS  REMOVAL  05/2020   Removal due to abscess  . PENILE PROSTHESIS PLACEMENT  04/2020  . THORACOTOMY Right 2008  ?  . TONSILLECTOMY      Social History: Social History   Socioeconomic History  . Marital status: Divorced    Spouse name: Not on file  . Number of children: 2  . Years of education: Not on file  . Highest education level: Bachelor's degree (e.g.,  BA, AB, BS)  Occupational History  . Not on file  Tobacco Use  . Smoking status: Never Smoker  . Smokeless tobacco: Never Used  Vaping Use  . Vaping Use: Never used  Substance and Sexual Activity  . Alcohol use: Not Currently  . Drug use: Never  . Sexual activity: Not on file  Other Topics Concern  . Not on file  Social History Narrative   Pt is divorced   2 children   Right handed   Drinks soda, no coffee, no tea    Lives on the third floor of an apartment bldg. One story studio apartment   Social Determinants of Health  Financial Resource Strain: Not on file  Food Insecurity: Not on file  Transportation Needs: Not on file  Physical Activity: Not on file  Stress: Not on file  Social Connections: Not on file  Intimate Partner Violence: Not on file    Family History: Family History  Problem Relation Age of Onset  . CAD Mother   . Lung cancer Mother   . CAD Father   . Lung cancer Father   . CAD Brother     Review of Systems: Review of Systems  Constitutional: Negative.   Respiratory: Negative for shortness of breath.   Cardiovascular: Negative for chest pain, palpitations and leg swelling.  Gastrointestinal: Negative.   Neurological: Negative.     Physical Exam: Vital Signs BP 109/70 (BP Location: Left Arm, Patient Position: Sitting, Cuff Size: Normal)   Pulse 81   Ht 6' (1.829 m)   Wt 165 lb 3.2 oz (74.9 kg)   SpO2 98%   BMI 22.41 kg/m   Physical Exam Constitutional:      General: Not in acute distress.    Appearance: Normal appearance. Not ill-appearing.  HENT:     Head: Normocephalic and atraumatic.  Eyes:     Pupils: Pupils are equal, round Neck:     Musculoskeletal: Normal range of motion.  Cardiovascular:     Rate and Rhythm: Normal rate    Pulses: Normal pulses.  Pulmonary:     Effort: Pulmonary effort is normal. No respiratory distress.  Abdominal:     General: Abdomen is flat. There is no distension.  Musculoskeletal: Normal range  of motion.  Skin:    General: Skin is warm and dry.  Right chest wall wound VAC in place with good suction noted.  No surrounding erythema.    Findings: No erythema or rash.  Neurological:     General: No focal deficit present.     Mental Status: Alert and oriented to person, place, and time. Mental status is at baseline.     Motor: No weakness.  Psychiatric:        Mood and Affect: Mood normal.        Behavior: Behavior normal.    Assessment/Plan: The patient is scheduled for debridement of right chest wall wound, possible skin graft and possible application of MaitriDerm with Dr. Marla Roe.  Risks, benefits, and alternatives of procedure discussed, questions answered and consent obtained.    Smoking Status: Non-smoker; Counseling Given?  N/A  Caprini Score: 4, moderate; Risk Factors include: Age, and length of planned surgery. Recommendation for mechanical and pharmacological prophylaxis while hospitalized. Encourage early ambulation.   Post-op Rx sent to pharmacy: Zofran.  Patient is currently on fentanyl patches every 24 and Percocet every 6 hours as needed.  He reports that he is comfortable continuing this through the postoperative period.  He has a history of kidney disease and cannot tolerate additional ibuprofen.  Patient was provided with the General Surgical Risk consent document and Pain Medication Agreement prior to their appointment.  They had adequate time to read through the risk consent documents and Pain Medication Agreement. We also discussed them in person together during this preop appointment. All of their questions were answered to their satisfaction.  Recommended calling if they have any further questions.  Risk consent form and Pain Medication Agreement to be scanned into patient's chart.  The risks that can be encountered with and after a skin graft were discussed and include the following but not limited to these: bleeding, infection, delayed healing,  anesthesia  risks, skin sensation changes, injury to structures including nerves, blood vessels, and muscles which may be temporary or permanent, allergies to tape, suture materials and glues, blood products, topical preparations or injected agents, skin contour irregularities, skin discoloration and swelling, deep vein thrombosis, cardiac and pulmonary complications, pain, which may persist, failure of the graft and possible need for revisional surgery or staged procedures.   Electronically signed by: Carola Rhine Denijah Karrer, PA-C 05/04/2021 1:12 PM

## 2021-05-07 ENCOUNTER — Other Ambulatory Visit: Payer: Self-pay

## 2021-05-07 ENCOUNTER — Telehealth: Payer: Self-pay | Admitting: Internal Medicine

## 2021-05-07 ENCOUNTER — Encounter (HOSPITAL_BASED_OUTPATIENT_CLINIC_OR_DEPARTMENT_OTHER): Payer: Self-pay | Admitting: Plastic Surgery

## 2021-05-07 NOTE — Progress Notes (Signed)
Called Shana at Dr. Eusebio Friendly office to notify that patient needs cardiac clearance in order to proceed with surgery at [facility]. Pt states that he saw Dr. Margaretann Loveless for chest pain and was diagnosed with coronary artery blockage and placed on Imdur. Patient states he missed his last cardiology appointment and has not re-scheduled.   Also awaiting medical clearance. Edwena Blow has already requested this.  Edwena Blow stated she will look into this and that patient may be re-scheduled at the main OR.

## 2021-05-07 NOTE — Telephone Encounter (Signed)
PT NEEDS CARDIOLOGY RELEASE FOR SURGERY, CALLING TO CHECK THE STATUS, EDU PT NO REQ FOR CLEARANCE IS SHOWN ON FILE, F/U W/ PRACTICE PERFORMING PROCEDURE TO FAX REQ TO [place]

## 2021-05-08 ENCOUNTER — Ambulatory Visit (INDEPENDENT_AMBULATORY_CARE_PROVIDER_SITE_OTHER): Payer: Medicare Other | Admitting: Internal Medicine

## 2021-05-08 ENCOUNTER — Ambulatory Visit: Payer: Medicare Other | Admitting: Thoracic Surgery (Cardiothoracic Vascular Surgery)

## 2021-05-08 ENCOUNTER — Other Ambulatory Visit: Payer: Self-pay

## 2021-05-08 ENCOUNTER — Encounter: Payer: Self-pay | Admitting: Internal Medicine

## 2021-05-08 VITALS — BP 126/78 | HR 82 | Ht 72.0 in | Wt 162.6 lb

## 2021-05-08 DIAGNOSIS — I10 Essential (primary) hypertension: Secondary | ICD-10-CM | POA: Diagnosis not present

## 2021-05-08 DIAGNOSIS — E785 Hyperlipidemia, unspecified: Secondary | ICD-10-CM

## 2021-05-08 DIAGNOSIS — Z01810 Encounter for preprocedural cardiovascular examination: Secondary | ICD-10-CM | POA: Diagnosis not present

## 2021-05-08 DIAGNOSIS — I2510 Atherosclerotic heart disease of native coronary artery without angina pectoris: Secondary | ICD-10-CM

## 2021-05-08 NOTE — Telephone Encounter (Signed)
Pt agreeable to pre op appt. Ok per Belinda Block RN to use NP time slot for today for pre op. Pt is grateful and agreeable to appt today. I will forward clearance notes to MD for upcoming appt today. Will send FYI to requesting surgeon Dr. Marla Roe pt has appt today for pre op assessment.

## 2021-05-08 NOTE — Telephone Encounter (Signed)
Primary Cardiologist:Gayatri Stann Mainland, MD  Chart reviewed as part of pre-operative protocol coverage. Because of Brian Malone's past medical history and time since last visit, he/she will require a follow-up visit in order to better assess preoperative cardiovascular risk.  Pre-op covering staff: - Please schedule appointment and call patient to inform them. - Please contact requesting surgeon's office via preferred method (i.e, phone, fax) to inform them of need for appointment prior to surgery.  If applicable, this message will also be routed to pharmacy pool and/or primary cardiologist for input on holding anticoagulant/antiplatelet agent as requested below so that this information is available at time of patient's appointment.   Deberah Pelton, NP  05/08/2021, 8:26 AM

## 2021-05-08 NOTE — Progress Notes (Signed)
Cardiology Office Note:    Date:  05/08/2021   ID:  Brian Malone, Brian Malone 1951-05-05, MRN [id number]  PCP:  [name], [name], MD  Cardiologist:  Elouise Munroe, MD  Electrophysiologist:  None   Referring MD: [name], [name], MD   Chief Complaint: CAD, hypotension and tachycardia, Pre-Op optimization needed  History of Present Illness:    Brian Malone is a 70 y.o. male with a history of diabetes, hypertension, HIV, hyperlipidemia, chronic kidney disease stage III.  Recent CT coronary angiography demonstrated moderate CAD with indeterminate FFR values.   05/08/2021 Presents today after a month of hospitalization for rib fractures complicated by hematoma and infection requiring to debridement surgeries with general anesthesia and prolonged course of antibiotics.  He is here for preoperative risk stratification prior to plastic surgery for reconstruction of chest wall after extensive debridement was performed. At this visit he is wearing a WoundVac. He still has some pain due to his previous surgeries, but notes his surgical wounds are continuing to heal properly.  He showed me images from his wound debridement and wound care, images seem to show granulation tissue with appropriate expected healing.  The fall leading to his hospital visit was due to tripping while walking down the 35 degree incline of his driveway.  He sustained multiple rib fractures and developed hematomas.  For exercise, he is now starting to walk laps around the back of his yard, and denies any exertional chest pains or shortness of breath.  He denies any palpitations, headaches, lightheadedness, or syncope. Also has no lower extremity edema, orthopnea or PND.  HIV has remained well controlled and undetected since 2013 with continued antiretroviral therapy.  He also has a diagnosis of hepatitis C and has been well controlled per his report with medication therapy.  Past Medical History:  Diagnosis Date   . Acute subdural hematoma ([place]) 10/28/2019  . Anxiety   . Cellulitis of right upper extremity 11/04/2020  . Depression   . Diabetes mellitus without complication ([place])   . Dysphagia   . GERD (gastroesophageal reflux disease)   . Hepatitis C   . High cholesterol   . History of COVID-19 05/26/2020  . HIV (human immunodeficiency virus infection) ([place])   . Hypertension   . Renal disorder   . Tachycardia     Past Surgical History:  Procedure Laterality Date  . ANKLE ARTHROSCOPY    . APPENDECTOMY    . APPLICATION OF WOUND VAC Right 03/25/2021   Procedure: APPLICATION OF WOUND VAC;  Surgeon: Lajuana Matte, MD;  Location: [place];  Service: Thoracic;  Laterality: Right;  . BACK SURGERY     FUSION  . INCISION AND DRAINAGE Right 03/25/2021   Procedure: INCISION AND DRAINAGE OF CHEST WALL;  Surgeon: Lajuana Matte, MD;  Location: [place];  Service: Thoracic;  Laterality: Right;  . INCISION AND DRAINAGE ABSCESS Right 03/19/2021   Procedure: INCISION AND DRAINAGE RIGHT CHEST ABSCESS;  Surgeon: Lajuana Matte, MD;  Location: [place];  Service: Vascular;  Laterality: Right;  . IR US GUIDE BX ASP/DRAIN  03/14/2021  . IR US GUIDE BX ASP/DRAIN  03/14/2021  . PENILE PROSTHESIS  REMOVAL  05/2020   Removal due to abscess  . PENILE PROSTHESIS PLACEMENT  04/2020  . THORACOTOMY Right 2008  ?  . TONSILLECTOMY      Current Medications: Current Meds  Medication Sig  . busPIRone (BUSPAR) 30 MG tablet Take 30 mg by mouth 2 (two) times daily.  Marland Kitchen  clonazePAM (KLONOPIN) 0.5 MG tablet Take 0.5 mg by mouth 3 (three) times daily.  Marland Kitchen doxycycline (VIBRA-TABS) 100 MG tablet Take 1 tablet (100 mg total) by mouth 2 (two) times daily.  Marland Kitchen ezetimibe (ZETIA) 10 MG tablet Take 10 mg by mouth daily.  . feeding supplement (ENSURE ENLIVE / ENSURE PLUS) LIQD Take 237 mLs by mouth 2 (two) times daily between meals.  . feeding supplement, GLUCERNA SHAKE, (GLUCERNA SHAKE) LIQD Take 237 mLs by mouth daily.  . fentaNYL  (DURAGESIC) 25 MCG/HR Place 1 patch onto the skin every 3 (three) days.  . isosorbide mononitrate (IMDUR) 60 MG 24 hr tablet Take 60 mg by mouth daily.  Marland Kitchen loratadine (CLARITIN) 10 MG tablet Take 10 mg by mouth daily.   . ondansetron (ZOFRAN-ODT) 8 MG disintegrating tablet Take 8 mg by mouth every 8 (eight) hours as needed.  Marland Kitchen oxyCODONE-acetaminophen (PERCOCET) 10-325 MG tablet Take 1 tablet by mouth every 6 (six) hours as needed.  . pantoprazole (PROTONIX) 40 MG tablet Take 40 mg by mouth daily before breakfast.   . propranolol ER (INDERAL LA) 60 MG 24 hr capsule Take 60 mg by mouth daily.  . Semaglutide,0.25 or 0.5MG /DOS, 2 MG/1.5ML SOPN Inject 0.5 mg into the skin every Friday.   . senna-docusate (SENOKOT-S) 8.6-50 MG tablet Take 2 tablets by mouth at bedtime as needed for mild constipation.  . tamsulosin (FLOMAX) 0.4 MG CAPS capsule Take 0.4 mg by mouth at bedtime.   Marland Kitchen tiZANidine (ZANAFLEX) 4 MG tablet Take 4 mg by mouth at bedtime.   . TRIUMEQ 600-50-300 MG tablet Take 1 tablet by mouth daily.   . [DISCONTINUED] Nutritional Supplements (,FEEDING SUPPLEMENT, PROSOURCE PLUS) liquid Take 30 mLs by mouth 2 (two) times daily as needed (Provide for additional protein as pt requests).  . [DISCONTINUED] ondansetron (ZOFRAN ODT) 4 MG disintegrating tablet 4mg  ODT q4 hours prn nausea/vomit  . [DISCONTINUED] ondansetron (ZOFRAN) 4 MG tablet Take 1 tablet (4 mg total) by mouth every 8 (eight) hours as needed for nausea or vomiting.     Allergies:   Adhesive [tape], Cyclobenzaprine, Duloxetine hcl, Morphine, Zyvox [linezolid], and Pregabalin   Social History   Socioeconomic History  . Marital status: Divorced    Spouse name: Not on file  . Number of children: 2  . Years of education: Not on file  . Highest education level: Bachelor's degree (e.g., BA, AB, BS)  Occupational History  . Not on file  Tobacco Use  . Smoking status: Never Smoker  . Smokeless tobacco: Never Used  Vaping Use  .  Vaping Use: Never used  Substance and Sexual Activity  . Alcohol use: Not Currently  . Drug use: Never  . Sexual activity: Not on file  Other Topics Concern  . Not on file  Social History Narrative   Pt is divorced   2 children   Right handed   Drinks soda, no coffee, no tea    Lives on the third floor of an apartment bldg. One story studio apartment   Social Determinants of Health   Financial Resource Strain: Not on file  Food Insecurity: Not on file  Transportation Needs: Not on file  Physical Activity: Not on file  Stress: Not on file  Social Connections: Not on file     Family History: The patient's family history includes CAD in his brother, father, and mother; Lung cancer in his father and mother.  ROS:   Please see the history of present illness.    (+)  Pains at surgical sites All other systems reviewed and are negative.  EKGs/Labs/Other Studies Reviewed:    The following studies were reviewed today:  Echo 11/23/2019: 1. Left ventricular ejection fraction, by visual estimation, is 50 to  55%. The left ventricle has normal function. There is no left ventricular  hypertrophy.  2. Global right ventricle has normal systolic function.The right  ventricular size is normal. No increase in right ventricular wall  thickness.  3. Left atrial size was normal.  4. Right atrial size was mildly dilated.  5. The mitral valve is normal in structure. No evidence of mitral valve  regurgitation.  6. The tricuspid valve is normal in structure. Tricuspid valve  regurgitation is not demonstrated.  7. The aortic valve is grossly normal. Aortic valve regurgitation is  mild.  8. The pulmonic valve was normal in structure. Pulmonic valve  regurgitation is not visualized.  9. The atrial septum is grossly normal.   Vas [place] ABI 05/15/2019: Right: Resting right ankle-brachial index is within normal range. No  evidence of significant right lower extremity arterial disease.    Left: Resting left ankle-brachial index is within normal range. No  evidence of significant left lower extremity arterial disease.   EKG:   05/08/2021: NSR, rate 82 bpm 05/30/2020: NSR  Recent Labs: 03/26/2021: B Natriuretic Peptide 40.5; Magnesium 1.9 04/11/2021: ALT 14; BUN 42; Creatinine, Ser 1.35; Hemoglobin 10.6; Platelets 266; Potassium 4.7; Sodium 133  Recent Lipid Panel    Component Value Date/Time   CHOL 149 11/22/2019 2113   TRIG 133 02/12/2020 1928   HDL 25 (L) 11/22/2019 2113   CHOLHDL 6.0 11/22/2019 2113   VLDL 77 (H) 11/22/2019 2113   LDLCALC 47 11/22/2019 2113    Physical Exam:    VS:  BP 126/78   Pulse 82   Ht 6' (1.829 m)   Wt 162 lb 9.6 oz (73.8 kg)   SpO2 97%   BMI 22.05 kg/m     Wt Readings from Last 5 Encounters:  05/08/21 162 lb 9.6 oz (73.8 kg)  05/04/21 165 lb 3.2 oz (74.9 kg)  04/24/21 165 lb 6.4 oz (75 kg)  04/21/21 164 lb 6.4 oz (74.6 kg)  04/17/21 166 lb (75.3 kg)    Constitutional: No acute distress Eyes: sclera non-icteric, normal conjunctiva and lids ENMT: normal dentition, moist mucous membranes Cardiovascular: regular rhythm, normal rate, no murmurs. S1 and S2 normal. Radial pulses normal bilaterally. No jugular venous distention.  Wound VAC in place over right chest wall Respiratory: clear to auscultation bilaterally GI : normal bowel sounds, soft and nontender. No distention.   MSK: extremities warm, well perfused. No edema.  NEURO: grossly nonfocal exam, moves all extremities. PSYCH: alert and oriented x 3, normal mood and affect.   ASSESSMENT:    1. Pre-operative cardiovascular examination   2. Essential hypertension   3. Coronary artery disease involving native coronary artery of native heart without angina pectoris   4. Hyperlipidemia, unspecified hyperlipidemia type    PLAN:    Pre-operative cardiovascular examination - Plan: EKG 12-Lead -EKG normal today and patient is asymptomatic. The patient is intermediate risk for  general anesthesia.  He recently had general anesthesia 2 times in the last 6 weeks with no complications afterward.  He is stable on current cardiovascular regimen of Zetia, Imdur, propranolol.  No further cardiovascular testing is required prior to the procedure.  If this level of risk is acceptable to the patient and surgical team, the patient should be considered optimized  from a cardiovascular standpoint.  Patient understands risk stratification and is willing to proceed with surgical recommendations.  Essential hypertension-blood pressures well controlled today, continue propranolol.  He uses propranolol which also helps with his anxiety.  Coronary artery disease involving native coronary artery of native heart without angina pectoris -Patient has moderate CAD with indeterminate FFR and no chest pain.  Continue medical management of chest pain and angina.  No recent angina with addition of nitrate.  Hyperlipidemia, unspecified hyperlipidemia type -Currently on Zetia, LDL was 47 at last check.   Cherlynn Kaiser, MD [place]  [facility]    Medication Adjustments/Labs and Tests Ordered: Current medicines are reviewed at length with the patient today.  Concerns regarding medicines are outlined above.  Orders Placed This Encounter  Procedures  . EKG 12-Lead   No orders of the defined types were placed in this encounter.   Patient Instructions  Medication Instructions:  No Changes In Medications at this time.  *If you need a refill on your cardiac medications before your next appointment, please call your pharmacy*  Follow-Up: At [facility], you and your health needs are our priority.  As part of our continuing mission to provide you with exceptional heart care, we have created designated Provider Care Teams.  These Care Teams include your primary Cardiologist (physician) and Advanced Practice Providers (APPs -  Physician Assistants and Nurse Practitioners) who all work  together to provide you with the care you need, when you need it.  Your next appointment:   1 year(s)  The format for your next appointment:   In Person  Provider:   Cherlynn Kaiser, MD     [facility],acting as a scribe for Elouise Munroe, MD.,have documented all relevant documentation on the behalf of Elouise Munroe, MD,as directed by  Elouise Munroe, MD while in the presence of Elouise Munroe, MD.  I, Elouise Munroe, MD, have reviewed all documentation for this visit. The documentation on 05/08/21 for the exam, diagnosis, procedures, and orders are all accurate and complete.

## 2021-05-08 NOTE — Telephone Encounter (Signed)
   [facility] Pre-operative Risk Assessment    Patient Name: Brian Malone  DOB: [birth year]  MRN: [id number]  Request for surgical clearance:  1. What type of surgery is being performed? RIGHT CHEST WOUND EXCISION W/POSSIBLE SKIN GRAFT & SKIN SUBSITUTION   2. When is this surgery scheduled? 05/14/2021   3. What type of clearance is required (medical clearance vs. Pharmacy clearance to hold med vs. Both)? MEDICAL  4. Are there any medications that need to be held prior to surgery and how long? NONE LISTED   5. Practice name and name of physician performing surgery? Rule, PA-C   6. What is the office phone number? [phone]   7.   What is the office fax number? [phone]  8.   Anesthesia type (None, local, MAC, general) ? GENERAL

## 2021-05-08 NOTE — Patient Instructions (Signed)

## 2021-05-11 ENCOUNTER — Other Ambulatory Visit: Payer: Self-pay

## 2021-05-11 ENCOUNTER — Other Ambulatory Visit (HOSPITAL_COMMUNITY): Payer: Medicare Other

## 2021-05-11 ENCOUNTER — Encounter (HOSPITAL_COMMUNITY): Payer: Self-pay | Admitting: Plastic Surgery

## 2021-05-11 NOTE — Progress Notes (Signed)
Spoke with pt for pre-op call. Pt has hx of moderate CAD and Tachycardia. Pt sees Dr. Margaretann Loveless. Cardiac clearance in Epic dated 05/08/21. Pt denies any recent chest pain or shortness of breath. Pt is a type 2 diabetic. Last A1C was 6.4 on 01/16/21. He states his fasting blood sugar usually runs between 100-130. Instructed pt not to take his Ozempic the day of surgery. Instructed him to check his blood sugar when he gets up Thursday AM. If blood sugar is 70 or below, treat with 1/2 cup of clear juice (apple or cranberry) and recheck blood sugar 15 minutes after drinking juice. Instructed pt to let his nurse know when he arrives Thursday if he had to treat a low blood sugar. He voiced understanding.  Pt's surgery is scheduled as ambulatory so no Covid test is required prior to surgery.

## 2021-05-11 NOTE — Telephone Encounter (Signed)
     Please see office note from Dr. Margaretann Loveless from 05/08/21 regarding surgical clearance. Richardson Dopp, PA-C    05/11/2021 8:41 AM

## 2021-05-12 ENCOUNTER — Telehealth: Payer: Self-pay

## 2021-05-12 ENCOUNTER — Inpatient Hospital Stay (HOSPITAL_COMMUNITY): Admit: 2021-05-12 | Payer: Medicare Other

## 2021-05-12 NOTE — Telephone Encounter (Signed)
Returned patients call. He was sitting at the hospital waiting for labs to be done, but said the staff could not find the order. After speaking with Elise Benne at [facility], the lab orders were initiated when the patient's surgery was scheduled for their location. The surgery was moved from [facility] to the main hospital due to health history. It appears that the PAT nurse advised the patient that he will monitor his blood sugar and report that information to the nurse when he arrives for surgery on 5/26. We cannot find any pre-surgery labs orders in the system. Patient was advised that he can return home today and to follow the instructions provided to him by the PAT nurse. Mr. Mortellaro voiced understanding and agreement.

## 2021-05-12 NOTE — Telephone Encounter (Signed)
Patient left voicemail requesting call back regarding his surgery on 05/14/2021.

## 2021-05-12 NOTE — Anesthesia Preprocedure Evaluation (Addendum)
Anesthesia Evaluation  Patient identified by MRN, date of birth, ID band Patient awake    Reviewed: Allergy & Precautions, NPO status , Patient's Chart, lab work & pertinent test results  Airway Mallampati: I  TM Distance: >3 FB Neck ROM: Full    Dental  (+) Dental Advisory Given   Pulmonary neg pulmonary ROS,    breath sounds clear to auscultation       Cardiovascular hypertension, Pt. on medications and Pt. on home beta blockers + CAD   Rhythm:Regular Rate:Normal     Neuro/Psych  Headaches,  Neuromuscular disease    GI/Hepatic GERD  ,(+) Hepatitis -, C  Endo/Other  diabetes  Renal/GU Renal disease     Musculoskeletal   Abdominal   Peds  Hematology  (+) HIV,   Anesthesia Other Findings   Reproductive/Obstetrics                                                            Anesthesia Evaluation  Patient identified by MRN, date of birth, ID band Patient awake    Reviewed: Allergy & Precautions, NPO status , Patient's Chart, lab work & pertinent test results, reviewed documented beta blocker date and time   Airway Mallampati: II  TM Distance: >3 FB Neck ROM: Full    Dental no notable dental hx. (+) Teeth Intact, Dental Advisory Given   Pulmonary pneumonia, resolved,    Pulmonary exam normal breath sounds clear to auscultation       Cardiovascular hypertension, Pt. on medications + CAD  Normal cardiovascular exam Rhythm:Regular Rate:Normal     Neuro/Psych  Headaches, PSYCHIATRIC DISORDERS Anxiety Depression Peripheral neuropathy  Neuromuscular disease    GI/Hepatic GERD  Medicated,(+)     substance abuse  IV drug use, Hepatitis -, CAbstinent since 2019   Endo/Other  diabetes, Poorly Controlled, Type 2  Renal/GU Renal diseaseHx/o AKI  negative genitourinary   Musculoskeletal  (+) Arthritis , Osteoarthritis,  narcotic dependentRight Chest wall abscess    Abdominal   Peds  Hematology  (+) anemia , HIV,   Anesthesia Other Findings   Reproductive/Obstetrics                            Anesthesia Physical Anesthesia Plan  ASA: III  Anesthesia Plan: MAC   Post-op Pain Management:    Induction: Intravenous  PONV Risk Score and Plan: 2 and Treatment may vary due to age or medical condition, Ondansetron, Midazolam and Propofol infusion  Airway Management Planned: Natural Airway and Simple Face Mask  Additional Equipment:   Intra-op Plan:   Post-operative Plan:   Informed Consent: I have reviewed the patients History and Physical, chart, labs and discussed the procedure including the risks, benefits and alternatives for the proposed anesthesia with the patient or authorized representative who has indicated his/her understanding and acceptance.       Plan Discussed with: CRNA and Anesthesiologist  Anesthesia Plan Comments:         Anesthesia Quick Evaluation  Anesthesia Physical Anesthesia Plan  ASA: II  Anesthesia Plan: General   Post-op Pain Management:    Induction: Intravenous  PONV Risk Score and Plan: 2 and Dexamethasone, Ondansetron and Treatment may vary due to age or medical condition  Airway Management Planned: LMA  Additional Equipment: None  Intra-op Plan:   Post-operative Plan: Extubation in OR  Informed Consent: I have reviewed the patients History and Physical, chart, labs and discussed the procedure including the risks, benefits and alternatives for the proposed anesthesia with the patient or authorized representative who has indicated his/her understanding and acceptance.     Dental advisory given  Plan Discussed with: CRNA  Anesthesia Plan Comments: ( )      Anesthesia Quick Evaluation

## 2021-05-12 NOTE — Progress Notes (Signed)
Anesthesia Chart Review:  Pt is a same day work up   Case: [id number] Date/Time: 05/14/21 0715   Procedures:      Right chest wound excision (Right Chest)     possible skin graft and Matriderm (Right Chest)   Anesthesia type: General   Pre-op diagnosis: Open wound of right chest wall   Location: [facility] OR ROOM 50 / [facility]   Surgeons: Wallace Going, DO      DISCUSSION: Pt is 70 years old with hx CAD (moderate CAD on CT with indeterminate FFR and no chest pain), HTN, tachycardia, DM, CKD (stage III), HIV, chronic hepatitis C   Hospitalized 3/25-4/22/22 for sepsis, R chest wall abscess and osteomyelitis in the setting of a fall & 6th/7th/8th rib fractures on 02/22/21. S/p debridement by CT surgery, wound vac in place.    PROVIDERS: - PCP is [name], [name], MD (notes in care everywhere)  - Cardiologist is Cherlynn Kaiser, MD who cleared pt for surgery at intermediate risk for general anesthesia at last office visit 05/08/21 - Infectious disease provider is Elenore Rota, MD (notes in care everywhere)    LABS: Will be obtained day of surgery   IMAGES: 1 view CXR 04/11/21: No active disease  CT chest 03/22/21:  1. No CT evidence of pulmonary embolism. 2. Small bilateral pleural effusions with partial compressive atelectasis of the lower lobes. Pneumonia is not excluded. Clinical correlation is recommended. 3. Large skin wound in the lower right anterior chest wall with a wound VAC. 4. Destructive changes of the anterior right sixth rib concerning for osteomyelitis. Clinical correlation is recommended. No drainable fluid collection or abscess. 5. Aortic Atherosclerosis    EKG 05/08/21: NSR   CV: CT coronary morphology 11/23/19:  1. Coronary calcium score of 914. This was 31 percentile for age and sex matched control. 2. Normal coronary origin with right dominance. 3. Diffuse moderate CAD with severe stenosis in small lumen branches - ostial portion of acute marginal branch, distal  portion of the LCX artery, OM2, and possibly in the mid to distal LAD. - Given diffuse disease in small branches, aggressive risk factor modification, anginal therapy and abstinence from drugs is recommended. CT FFR will be submitted to evaluate proximal and distal LAD lesions.  CT coronary FFR 11/23/19:  1. CT FFR analysis showed severe stenosis in the mid to distal LAD, ostial portion of OM2 and ostial portion of the acute marginal branch. These vessels are too small for intervention. Aggressive risk factor modification, anginal therapy and abstinence from drugs are recommended.   Echo 11/23/19:  1. Left ventricular ejection fraction, by visual estimation, is 50 to 55%. The left ventricle has normal function. There is no left ventricular hypertrophy.  2. Global right ventricle has normal systolic function.The right ventricular size is normal. No increase in right ventricular wall thickness.  3. Left atrial size was normal.  4. Right atrial size was mildly dilated.  5. The mitral valve is normal in structure. No evidence of mitral valve regurgitation.  6. The tricuspid valve is normal in structure. Tricuspid valve regurgitation is not demonstrated.  7. The aortic valve is grossly normal. Aortic valve regurgitation is mild.  8. The pulmonic valve was normal in structure. Pulmonic valve regurgitation is not visualized.  9. The atrial septum is grossly normal.    Past Medical History:  Diagnosis Date  . Acute subdural hematoma ([place]) 10/28/2019  . Anxiety   . Arthritis   . 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 Cellulitis of right upper extremity 11/04/2020  . Depression   . Diabetes mellitus without complication ([place])   . Dysphagia   . GERD (gastroesophageal reflux disease)   . Hepatitis C   . High cholesterol   . History of COVID-19 05/26/2020  . History of kidney stones   . HIV (human immunodeficiency virus infection) ([place])   . Hypertension   . Pneumonia   . Renal disorder   .  Tachycardia     Past Surgical History:  Procedure Laterality Date  . ANKLE ARTHROSCOPY    . APPENDECTOMY    . APPLICATION OF WOUND VAC Right 03/25/2021   Procedure: APPLICATION OF WOUND VAC;  Surgeon: Lajuana Matte, MD;  Location: [facility];  Service: Thoracic;  Laterality: Right;  . BACK SURGERY     FUSION  . INCISION AND DRAINAGE Right 03/25/2021   Procedure: INCISION AND DRAINAGE OF CHEST WALL;  Surgeon: Lajuana Matte, MD;  Location: [facility];  Service: Thoracic;  Laterality: Right;  . INCISION AND DRAINAGE ABSCESS Right 03/19/2021   Procedure: INCISION AND DRAINAGE RIGHT CHEST ABSCESS;  Surgeon: Lajuana Matte, MD;  Location: [facility];  Service: Vascular;  Laterality: Right;  . IR US GUIDE BX ASP/DRAIN  03/14/2021  . IR US GUIDE BX ASP/DRAIN  03/14/2021  . PENILE PROSTHESIS  REMOVAL  05/2020   Removal due to abscess  . PENILE PROSTHESIS PLACEMENT  04/2020  . THORACOTOMY Right 2008  ?  . TONSILLECTOMY      MEDICATIONS: No current facility-administered medications for this encounter.   . busPIRone (BUSPAR) 30 MG tablet  . clonazePAM (KLONOPIN) 0.5 MG tablet  . doxycycline (VIBRA-TABS) 100 MG tablet  . ezetimibe (ZETIA) 10 MG tablet  . feeding supplement (ENSURE ENLIVE / ENSURE PLUS) LIQD  . fentaNYL (DURAGESIC) 25 MCG/HR  . isosorbide mononitrate (IMDUR) 60 MG 24 hr tablet  . loratadine (CLARITIN) 10 MG tablet  . ondansetron (ZOFRAN-ODT) 8 MG disintegrating tablet  . oxyCODONE-acetaminophen (PERCOCET) 10-325 MG tablet  . pantoprazole (PROTONIX) 40 MG tablet  . polyethylene glycol (MIRALAX / GLYCOLAX) 17 g packet  . propranolol ER (INDERAL LA) 60 MG 24 hr capsule  . Semaglutide,0.25 or 0.5MG /DOS, 2 MG/1.5ML SOPN  . senna-docusate (SENOKOT-S) 8.6-50 MG tablet  . tamsulosin (FLOMAX) 0.4 MG CAPS capsule  . tiZANidine (ZANAFLEX) 4 MG tablet  . feeding supplement, GLUCERNA SHAKE, ([place]) LIQD    If labs acceptable day of surgery, I anticipate pt can proceed with  surgery as scheduled.  Willeen Cass, PhD, FNP-BC [facility]/Anesthesiology Phone: [phone] 05/12/2021 2:43 PM

## 2021-05-14 ENCOUNTER — Encounter (HOSPITAL_COMMUNITY)
Admission: RE | Disposition: A | Discharge status: Home / Self Care | Payer: Self-pay | Source: Home / Self Care | Attending: Plastic Surgery

## 2021-05-14 ENCOUNTER — Ambulatory Visit (HOSPITAL_COMMUNITY)
Admission: RE | Admit: 2021-05-14 | Discharge: 2021-05-14 | Disposition: A | Discharge status: Home / Self Care | Payer: Medicare Other | Attending: Plastic Surgery | Admitting: Plastic Surgery

## 2021-05-14 ENCOUNTER — Encounter (HOSPITAL_COMMUNITY): Payer: Self-pay | Admitting: Plastic Surgery

## 2021-05-14 ENCOUNTER — Telehealth: Payer: Self-pay

## 2021-05-14 ENCOUNTER — Ambulatory Visit (HOSPITAL_COMMUNITY): Payer: Medicare Other | Admitting: Emergency Medicine

## 2021-05-14 DIAGNOSIS — S21101A Unspecified open wound of right front wall of thorax without penetration into thoracic cavity, initial encounter: Secondary | ICD-10-CM | POA: Diagnosis present

## 2021-05-14 DIAGNOSIS — X58XXXA Exposure to other specified factors, initial encounter: Secondary | ICD-10-CM | POA: Insufficient documentation

## 2021-05-14 DIAGNOSIS — Z8616 Personal history of COVID-19: Secondary | ICD-10-CM | POA: Insufficient documentation

## 2021-05-14 DIAGNOSIS — Z79899 Other long term (current) drug therapy: Secondary | ICD-10-CM | POA: Diagnosis not present

## 2021-05-14 DIAGNOSIS — Z792 Long term (current) use of antibiotics: Secondary | ICD-10-CM | POA: Insufficient documentation

## 2021-05-14 HISTORY — DX: Dysphagia, unspecified: R13.10

## 2021-05-14 HISTORY — DX: Malignant (primary) neoplasm, unspecified: C80.1

## 2021-05-14 HISTORY — DX: Gastro-esophageal reflux disease without esophagitis: K21.9

## 2021-05-14 HISTORY — DX: Unspecified viral hepatitis C without hepatic coma: B19.20

## 2021-05-14 HISTORY — DX: Depression, unspecified: F32.A

## 2021-05-14 HISTORY — DX: Personal history of urinary calculi: Z87.442

## 2021-05-14 HISTORY — PX: DEBRIDEMENT AND CLOSURE WOUND: SHX5614

## 2021-05-14 HISTORY — DX: Pneumonia, unspecified organism: J18.9

## 2021-05-14 HISTORY — DX: Tachycardia, unspecified: R00.0

## 2021-05-14 HISTORY — DX: Unspecified osteoarthritis, unspecified site: M19.90

## 2021-05-14 HISTORY — DX: Pure hypercholesterolemia, unspecified: E78.00

## 2021-05-14 HISTORY — PX: SKIN SPLIT GRAFT: SHX444

## 2021-05-14 LAB — COMPREHENSIVE METABOLIC PANEL
ALT: 16 U/L (ref 0–44)
AST: 24 U/L (ref 15–41)
Albumin: 3.4 g/dL — ABNORMAL LOW (ref 3.5–5.0)
Alkaline Phosphatase: 79 U/L (ref 38–126)
Anion gap: 8 (ref 5–15)
BUN: 27 mg/dL — ABNORMAL HIGH (ref 8–23)
CO2: 26 mmol/L (ref 22–32)
Calcium: 9.1 mg/dL (ref 8.9–10.3)
Chloride: 103 mmol/L (ref 98–111)
Creatinine, Ser: 1.26 mg/dL — ABNORMAL HIGH (ref 0.61–1.24)
GFR, Estimated: >60 mL/min (ref >60)
Glucose, Bld: 155 mg/dL — ABNORMAL HIGH (ref 70–99)
Potassium: 4.3 mmol/L (ref 3.5–5.1)
Sodium: 137 mmol/L (ref 135–145)
Total Bilirubin: 0.9 mg/dL (ref 0.3–1.2)
Total Protein: 6.5 g/dL (ref 6.5–8.1)

## 2021-05-14 LAB — GLUCOSE, CAPILLARY
Glucose-Capillary: 152 mg/dL — ABNORMAL HIGH (ref 70–99)
Glucose-Capillary: 117 mg/dL — ABNORMAL HIGH (ref 70–99)

## 2021-05-14 LAB — CBC
HCT: 34.6 % — ABNORMAL LOW (ref 39.0–52.0)
Hemoglobin: 11.4 g/dL — ABNORMAL LOW (ref 13.0–17.0)
MCH: 32.1 pg (ref 26.0–34.0)
MCHC: 32.9 g/dL (ref 30.0–36.0)
MCV: 97.5 fL (ref 80.0–100.0)
Platelets: 165 10*3/uL (ref 150–400)
RBC: 3.55 MIL/uL — ABNORMAL LOW (ref 4.22–5.81)
RDW: 14.1 % (ref 11.5–15.5)
WBC: 4.9 10*3/uL (ref 4.0–10.5)
nRBC: 0 % (ref 0.0–0.2)

## 2021-05-14 LAB — SURGICAL PCR SCREEN
MRSA, PCR: NEGATIVE
Staphylococcus aureus: NEGATIVE

## 2021-05-14 SURGERY — DEBRIDEMENT, WOUND, WITH CLOSURE
Anesthesia: General | Site: Chest | Laterality: Right

## 2021-05-14 MED ORDER — SODIUM CHLORIDE 0.9% FLUSH: 3.0000 mL | INTRAVENOUS | Status: DC | PRN

## 2021-05-14 MED ORDER — ACETAMINOPHEN 500 MG PO TABS
1000.0000 mg | ORAL_TABLET | Freq: Once | ORAL | Status: AC
Start: 2021-05-14 — End: 2021-05-14
  Administered 2021-05-14: 1000 mg via ORAL
  Filled 2021-05-14: qty 2

## 2021-05-14 MED ORDER — PHENYLEPHRINE 40 MCG/ML (10ML) SYRINGE FOR IV PUSH (FOR BLOOD PRESSURE SUPPORT)
PREFILLED_SYRINGE | INTRAVENOUS | Status: DC | PRN
  Administered 2021-05-14: 120 ug via INTRAVENOUS

## 2021-05-14 MED ORDER — DEXAMETHASONE SODIUM PHOSPHATE 10 MG/ML IJ SOLN
INTRAMUSCULAR | Status: AC
  Filled 2021-05-14: qty 1

## 2021-05-14 MED ORDER — LIDOCAINE HCL 1 % IJ SOLN
INTRAMUSCULAR | Status: AC
  Filled 2021-05-14: qty 20

## 2021-05-14 MED ORDER — CHLORHEXIDINE GLUCONATE 0.12 % MT SOLN: 15.0000 mL | Freq: Once | OROMUCOSAL | Status: AC

## 2021-05-14 MED ORDER — SODIUM CHLORIDE 0.9% FLUSH: 3.0000 mL | Freq: Two times a day (BID) | INTRAVENOUS | Status: DC

## 2021-05-14 MED ORDER — DEXAMETHASONE SODIUM PHOSPHATE 10 MG/ML IJ SOLN
INTRAMUSCULAR | Status: DC | PRN
  Administered 2021-05-14: 10 mg via INTRAVENOUS

## 2021-05-14 MED ORDER — BUPIVACAINE HCL (PF) 0.5 % IJ SOLN
INTRAMUSCULAR | Status: AC
  Filled 2021-05-14: qty 60

## 2021-05-14 MED ORDER — CHLORHEXIDINE GLUCONATE CLOTH 2 % EX PADS: 6.0000 | MEDICATED_PAD | Freq: Once | CUTANEOUS | Status: DC

## 2021-05-14 MED ORDER — LACTATED RINGERS IV SOLN: INTRAVENOUS | Status: DC

## 2021-05-14 MED ORDER — PHENYLEPHRINE HCL-NACL 10-0.9 MG/250ML-% IV SOLN
INTRAVENOUS | Status: DC | PRN
  Administered 2021-05-14: 35 ug/min via INTRAVENOUS

## 2021-05-14 MED ORDER — ONDANSETRON HCL 4 MG/2ML IJ SOLN
INTRAMUSCULAR | Status: DC | PRN
  Administered 2021-05-14: 4 mg via INTRAVENOUS

## 2021-05-14 MED ORDER — PROPOFOL 10 MG/ML IV BOLUS
INTRAVENOUS | Status: DC | PRN
  Administered 2021-05-14: 200 mg via INTRAVENOUS

## 2021-05-14 MED ORDER — ONDANSETRON HCL 4 MG/2ML IJ SOLN
INTRAMUSCULAR | Status: AC
  Filled 2021-05-14: qty 2

## 2021-05-14 MED ORDER — CEFAZOLIN SODIUM-DEXTROSE 2-4 GM/100ML-% IV SOLN
2.0000 g | INTRAVENOUS | Status: AC
  Administered 2021-05-14: 2 g via INTRAVENOUS
  Filled 2021-05-14: qty 100

## 2021-05-14 MED ORDER — PROPOFOL 10 MG/ML IV BOLUS
INTRAVENOUS | Status: AC
  Filled 2021-05-14: qty 40

## 2021-05-14 MED ORDER — EPHEDRINE SULFATE-NACL 50-0.9 MG/10ML-% IV SOSY
PREFILLED_SYRINGE | INTRAVENOUS | Status: DC | PRN
  Administered 2021-05-14: 5 mg via INTRAVENOUS

## 2021-05-14 MED ORDER — MIDAZOLAM HCL 2 MG/2ML IJ SOLN
INTRAMUSCULAR | Status: AC
  Filled 2021-05-14: qty 2

## 2021-05-14 MED ORDER — LIDOCAINE HCL (PF) 1 % IJ SOLN
INTRAMUSCULAR | Status: AC
  Filled 2021-05-14: qty 30

## 2021-05-14 MED ORDER — LIDOCAINE 2% (20 MG/ML) 5 ML SYRINGE
INTRAMUSCULAR | Status: AC
  Filled 2021-05-14: qty 5

## 2021-05-14 MED ORDER — ACETAMINOPHEN 325 MG PO TABS: 650.0000 mg | ORAL_TABLET | ORAL | Status: DC | PRN

## 2021-05-14 MED ORDER — GABAPENTIN 300 MG PO CAPS
300.0000 mg | ORAL_CAPSULE | Freq: Once | ORAL | Status: AC
  Administered 2021-05-14: 300 mg via ORAL
  Filled 2021-05-14: qty 1

## 2021-05-14 MED ORDER — LIDOCAINE-EPINEPHRINE 1 %-1:100000 IJ SOLN
INTRAMUSCULAR | Status: AC
  Filled 2021-05-14: qty 1

## 2021-05-14 MED ORDER — FENTANYL CITRATE (PF) 250 MCG/5ML IJ SOLN
INTRAMUSCULAR | Status: AC
  Filled 2021-05-14: qty 5

## 2021-05-14 MED ORDER — ACETAMINOPHEN 650 MG RE SUPP: 650.0000 mg | RECTAL | Status: DC | PRN

## 2021-05-14 MED ORDER — EPINEPHRINE PF 1 MG/ML IJ SOLN
INTRAMUSCULAR | Status: AC
  Filled 2021-05-14: qty 1

## 2021-05-14 MED ORDER — FENTANYL CITRATE (PF) 250 MCG/5ML IJ SOLN
INTRAMUSCULAR | Status: DC | PRN
  Administered 2021-05-14 (×2): 25 ug via INTRAVENOUS

## 2021-05-14 MED ORDER — ORAL CARE MOUTH RINSE: 15.0000 mL | Freq: Once | OROMUCOSAL | Status: AC

## 2021-05-14 MED ORDER — CHLORHEXIDINE GLUCONATE 0.12 % MT SOLN
OROMUCOSAL | Status: AC
  Administered 2021-05-14: 15 mL via OROMUCOSAL
  Filled 2021-05-14: qty 15

## 2021-05-14 MED ORDER — ROCURONIUM BROMIDE 10 MG/ML (PF) SYRINGE
PREFILLED_SYRINGE | INTRAVENOUS | Status: AC
  Filled 2021-05-14: qty 10

## 2021-05-14 MED ORDER — MIDAZOLAM HCL 2 MG/2ML IJ SOLN
INTRAMUSCULAR | Status: DC | PRN
  Administered 2021-05-14: 2 mg via INTRAVENOUS

## 2021-05-14 MED ORDER — SODIUM CHLORIDE 0.9 % IV SOLN: 250.0000 mL | INTRAVENOUS | Status: DC | PRN

## 2021-05-14 SURGICAL SUPPLY — 90 items
BINDER BREAST 3XL (GAUZE/BANDAGES/DRESSINGS) IMPLANT
BINDER BREAST LRG (GAUZE/BANDAGES/DRESSINGS) IMPLANT
BINDER BREAST MEDIUM (GAUZE/BANDAGES/DRESSINGS) IMPLANT
BINDER BREAST XLRG (GAUZE/BANDAGES/DRESSINGS) IMPLANT
BINDER BREAST XXLRG (GAUZE/BANDAGES/DRESSINGS) IMPLANT
BLADE CLIPPER SURG (BLADE) IMPLANT
BLADE DERMATOME SS (BLADE) ×2 IMPLANT
BLADE HEX COATED 2.75 (ELECTRODE) ×2 IMPLANT
BLADE SURG 10 STRL SS (BLADE) IMPLANT
BLADE SURG 15 STRL LF DISP TIS (BLADE) ×1 IMPLANT
BLADE SURG 15 STRL SS (BLADE) ×1
BNDG ELASTIC 4X5.8 VLCR STR LF (GAUZE/BANDAGES/DRESSINGS) IMPLANT
BNDG ELASTIC 6X5.8 VLCR STR LF (GAUZE/BANDAGES/DRESSINGS) IMPLANT
BNDG GAUZE ELAST 4 BULKY (GAUZE/BANDAGES/DRESSINGS) IMPLANT
CANISTER SUCT 3000ML PPV (MISCELLANEOUS) IMPLANT
CANISTER WOUND CARE 500ML ATS (WOUND CARE) IMPLANT
COTTONBALL LRG STERILE PKG (GAUZE/BANDAGES/DRESSINGS) IMPLANT
COVER BACK TABLE 60X90IN (DRAPES) ×2 IMPLANT
COVER MAYO STAND STRL (DRAPES) ×2 IMPLANT
COVER SURGICAL LIGHT HANDLE (MISCELLANEOUS) ×2 IMPLANT
COVER WAND RF STERILE (DRAPES) IMPLANT
DECANTER SPIKE VIAL GLASS SM (MISCELLANEOUS) IMPLANT
DERMABOND ADVANCED (GAUZE/BANDAGES/DRESSINGS)
DERMABOND ADVANCED .7 DNX12 (GAUZE/BANDAGES/DRESSINGS) IMPLANT
DERMACARRIERS GRAFT 1 TO 1.5 (DISPOSABLE) ×2
DRAIN CHANNEL 19F RND (DRAIN) IMPLANT
DRAPE CHEST BREAST 15X10 FENES (DRAPES) IMPLANT
DRAPE HALF SHEET 40X57 (DRAPES) ×2 IMPLANT
DRAPE INCISE IOBAN 66X45 STRL (DRAPES) ×2 IMPLANT
DRAPE LAPAROTOMY 100X72 PEDS (DRAPES) IMPLANT
DRAPE ORTHO SPLIT 77X108 STRL (DRAPES) ×2
DRAPE SURG 17X23 STRL (DRAPES) IMPLANT
DRAPE SURG ORHT 6 SPLT 77X108 (DRAPES) ×2 IMPLANT
DRAPE U-SHAPE 76X120 STRL (DRAPES) IMPLANT
DRESSING HYDROCOLLOID 4X4 (GAUZE/BANDAGES/DRESSINGS) IMPLANT
DRSG ADAPTIC 3X8 NADH LF (GAUZE/BANDAGES/DRESSINGS) IMPLANT
DRSG CUTIMED SORBACT 7X9 (GAUZE/BANDAGES/DRESSINGS) IMPLANT
DRSG EMULSION OIL 3X3 NADH (GAUZE/BANDAGES/DRESSINGS) IMPLANT
DRSG HYDROCOLLOID 4X4 (GAUZE/BANDAGES/DRESSINGS) IMPLANT
DRSG OPSITE 6X11 MED (GAUZE/BANDAGES/DRESSINGS) IMPLANT
DRSG PAD ABDOMINAL 8X10 ST (GAUZE/BANDAGES/DRESSINGS) ×2 IMPLANT
DRSG TEGADERM 4X4.75 (GAUZE/BANDAGES/DRESSINGS) IMPLANT
DRSG TELFA 3X8 NADH (GAUZE/BANDAGES/DRESSINGS) ×4 IMPLANT
DRSG VAC ATS LRG SENSATRAC (GAUZE/BANDAGES/DRESSINGS) IMPLANT
DRSG VAC ATS MED SENSATRAC (GAUZE/BANDAGES/DRESSINGS) IMPLANT
DRSG VAC ATS SM SENSATRAC (GAUZE/BANDAGES/DRESSINGS) ×2 IMPLANT
ELECT REM PT RETURN 9FT ADLT (ELECTROSURGICAL) ×2
ELECTRODE REM PT RTRN 9FT ADLT (ELECTROSURGICAL) ×1 IMPLANT
EVACUATOR SILICONE 100CC (DRAIN) IMPLANT
FILTER STRAW FLUID ASPIR (MISCELLANEOUS) ×2 IMPLANT
GAUZE SPONGE 4X4 12PLY STRL (GAUZE/BANDAGES/DRESSINGS) ×2 IMPLANT
GAUZE XEROFORM 5X9 LF (GAUZE/BANDAGES/DRESSINGS) ×2 IMPLANT
GEL ULTRASOUND 20GR AQUASONIC (MISCELLANEOUS) IMPLANT
GLOVE BIO SURGEON STRL SZ 6.5 (GLOVE) ×4 IMPLANT
GOWN STRL REUS W/ TWL LRG LVL3 (GOWN DISPOSABLE) ×2 IMPLANT
GOWN STRL REUS W/TWL LRG LVL3 (GOWN DISPOSABLE) ×2
GRAFT DERMACARRIERS 1 TO 1.5 (DISPOSABLE) ×1 IMPLANT
GRAFT MYRIAD 3 LAYER 5X5 (Graft) ×2 IMPLANT
HANDPIECE INTERPULSE COAX TIP (DISPOSABLE)
KIT BASIN (CUSTOM PROCEDURE TRAY) ×2 IMPLANT
KIT BASIN OR (CUSTOM PROCEDURE TRAY) ×2 IMPLANT
KIT TURNOVER KIT B (KITS) ×2 IMPLANT
NEEDLE HYPO 25GX1X1/2 BEV (NEEDLE) ×2 IMPLANT
NEEDLE HYPO 25X1 1.5 SAFETY (NEEDLE) ×2 IMPLANT
NS IRRIG 1000ML POUR BTL (IV SOLUTION) ×2 IMPLANT
PACK GENERAL/GYN (CUSTOM PROCEDURE TRAY) ×2 IMPLANT
PAD ARMBOARD 7.5X6 YLW CONV (MISCELLANEOUS) ×4 IMPLANT
PENCIL SMOKE EVACUATOR (MISCELLANEOUS) ×2 IMPLANT
SET HNDPC FAN SPRY TIP SCT (DISPOSABLE) IMPLANT
SHEET MEDIUM DRAPE 40X70 STRL (DRAPES) IMPLANT
SPONGE LAP 18X18 RF (DISPOSABLE) ×2 IMPLANT
STAPLER VISISTAT 35W (STAPLE) ×2 IMPLANT
SURGILUBE 2OZ TUBE FLIPTOP (MISCELLANEOUS) IMPLANT
SUT CHROMIC 4 0 PS 2 18 (SUTURE) IMPLANT
SUT MNCRL AB 3-0 PS2 18 (SUTURE) IMPLANT
SUT MNCRL AB 4-0 PS2 18 (SUTURE) IMPLANT
SUT MON AB 5-0 PS2 18 (SUTURE) IMPLANT
SUT SILK 3 0 PS 1 (SUTURE) IMPLANT
SUT SILK 4 0 PS 2 (SUTURE) IMPLANT
SUT VIC AB 5-0 PS2 18 (SUTURE) ×4 IMPLANT
SWAB COLLECTION DEVICE MRSA (MISCELLANEOUS) IMPLANT
SWAB CULTURE ESWAB REG 1ML (MISCELLANEOUS) IMPLANT
SYR BULB IRRIG 60ML STRL (SYRINGE) IMPLANT
SYR CONTROL 10ML LL (SYRINGE) ×2 IMPLANT
TISSUE MATRIDERM SM 5.2X7.4X1 (Tissue) ×2 IMPLANT
TOWEL GREEN STERILE (TOWEL DISPOSABLE) ×2 IMPLANT
TOWEL GREEN STERILE FF (TOWEL DISPOSABLE) ×4 IMPLANT
TUBE CONNECTING 20X1/4 (TUBING) ×2 IMPLANT
UNDERPAD 30X36 HEAVY ABSORB (UNDERPADS AND DIAPERS) ×2 IMPLANT
YANKAUER SUCT BULB TIP NO VENT (SUCTIONS) ×2 IMPLANT

## 2021-05-14 NOTE — Interval H&P Note (Signed)
History and Physical Interval Note:  05/14/2021 7:04 AM  Brian Malone  has presented today for surgery, with the diagnosis of Open wound of right chest wall.  The various methods of treatment have been discussed with the patient and family. After consideration of risks, benefits and other options for treatment, the patient has consented to  Procedure(s): Right chest wound excision (Right) possible skin graft and Matriderm (Right) as a surgical intervention.  The patient's history has been reviewed, patient examined, no change in status, stable for surgery.  I have reviewed the patient's chart and labs.  Questions were answered to the patient's satisfaction.     Loel Lofty Dane Bloch

## 2021-05-14 NOTE — Op Note (Signed)
DATE OF OPERATION: 05/14/2021  LOCATION: [name]  PREOPERATIVE DIAGNOSIS: check wound 6 x 8 cm  POSTOPERATIVE DIAGNOSIS: Same  PROCEDURE: Excision/debridemnet of chest wound 6 x 8 cm with placement of Matriderm 5 x 5 cm and Myriad 5  5 cm, placement of VAC  SURGEON: Jevon Shells [employer], DO  ASSISTANT: Rodman Key Visual merchandiser, PA  EBL: 1 cc  CONDITION: Stable  COMPLICATIONS: None  INDICATION: The patient, Brian Malone, is a 70 y.o. male born on 1951-03-17, is here for treatment of a chest wound after an accident.   PROCEDURE DETAILS:  The patient was seen prior to surgery and marked.  The IV antibiotics were given. The patient was taken to the operating room and given a general anesthetic. A standard time out was performed and all information was confirmed by those in the room. SCDs were placed.   The chest was prepped and draped.  The #10 blade was used to excise the nonviable edges of skin and soft tissue of the 6 x 8 cm wound.  The area was irrigated with saline.  All of the Matriderm was used on the superior aspect of the wound.  All of the myriad was used on the inferior portion of the wound.  It was secured with Vicryl.  The sorbact was placed and secured with the Vicryl. KY was placed over the sorbact. The VAC was placed and there was an excellent seal.  The patient was allowed to wake up and taken to recovery room in stable condition at the end of the case. The family was notified at the end of the case.   The advanced practice practitioner (APP) assisted throughout the case.  The APP was essential in retraction and counter traction when needed to make the case progress smoothly.  This retraction and assistance made it possible to see the tissue plans for the procedure.  The assistance was needed for blood control, tissue re-approximation and assisted with closure of the incision site.   The advanced practice practitioner (APP) assisted throughout the  case.  The APP was essential in retraction and counter traction when needed to make the case progress smoothly.  This retraction and assistance made it possible to see the tissue plans for the procedure.  The assistance was needed for blood control, tissue re-approximation and assisted with closure of the incision site.

## 2021-05-14 NOTE — Telephone Encounter (Signed)
Heather from [place] called back to follow up on wound vac orders. Read message from [facility], PA-C. Nira Conn will hold off until next week. [facility] fax # [phone]

## 2021-05-14 NOTE — Telephone Encounter (Signed)
Heather called from [facility] to say that the patient had surgery with Dr. Marla Roe today.  She would like to know if she is supposed to see him tomorrow and if so, please let her know the wound care orders for the patient.  Heather asked if the patient has a wound vac and if so, is it supposed to stay on until his follow-up visit with [place] on 05/22/2021?  Please call.

## 2021-05-14 NOTE — Discharge Instructions (Signed)
Wound Care  Guide to Wound Care  Proper wound care may reduce the risk of infection, improve healing rates, and limit scarring.  This is a general guide to help care for and manage wounds treated with a Wound Matrix.   Dressing Changes The frequency of dressing changes can vary based on which product was applied, the size of the wound, or the amount of wound drainage. Dressing inspections are recommended, at least weekly.   If you have a Wound VAC it will be changed in one week after the first time it is applied.  Then it will be changed once or twice a week.   If you don't have a Wound VAC, then place KY gel on the wound daily and cover with gauze.  Dressing Types Primary Dressing:  Non-adherent dressing goes directly over wounds being treated with the wound matrix.  Secondary Dressing:  Secures the primary dressing in place and provides extra protection, compression, and absorption.  1. Wash Hands - To help decrease the risk of infection, caregivers should wash their hands for a minimum of 20 seconds and may use medical gloves.   2. Remove the Dressings - Avoid removing product from the wound by carefully removing the applicable dressing(s) at the time points recommended above, or as recommended by the treating physician.  Expected Color and Odor:  It is entirely normal for the wound to have an unpleasant odor and to form a caramel-colored gel as the product absorbs into the wound. It is  important to leave this gel on the wound site.  3. Clean the Wound - Use clean water or saline to gently rinse around the wound surface and remove any excess discharge that may be present on the wound. Do not wipe off any of the caramel-colored gel on the wound.   What to look out for: . Large or increased amount of drainage  . Surrounding skin has worsening redness or hot to touch  . Increased pain in or around the wound  . Flu-like symptoms, fatigue, decreased appetite, fever  . Hard, crusty wound  surface with black or brown coloring  4. Apply New Dressings - Dressings should cover the entire wound and be suitable for maintaining a moist wound environment.  The non-adherent mesh dressing should be left in place.  New dressing should consist of KY Jelly to keep the wound moist and soft gauze secured with a wrap or tape.   Maintain a Hydrated Wound Area It is important to keep the wound area moist throughout the healing process. If the wound appears to be dry during dressing changes, select a dressing that will hydrate the wound and maintain that ideal moist environment. If you are unsure what to do, ask the treating physician.  Remodeling Process Every patient heals differently, and no two cases are the same. The size and location of the wound, product type and layering configurations, and general patient health all contribute to how quickly a wound will heal.  While many factors can influence the rate at which the product absorbs, the following can be used as a general guide.   THINGS TO DO: Refrain from smoking High protein diet with plenty of vegetables and some fruit  Limit simple processed carbohydrates and sugar Protect the wound from trauma Protect the dressing    Skin substitute            Sorbact dressing

## 2021-05-14 NOTE — Anesthesia Procedure Notes (Signed)
Procedure Name: LMA Insertion Date/Time: 05/14/2021 7:39 AM Performed by: Rande Brunt, CRNA Pre-anesthesia Checklist: Patient identified, Emergency Drugs available, Suction available and Patient being monitored Patient Re-evaluated:Patient Re-evaluated prior to induction Oxygen Delivery Method: Circle System Utilized Preoxygenation: Pre-oxygenation with 100% oxygen Induction Type: IV induction Ventilation: Mask ventilation without difficulty LMA: LMA inserted LMA Size: 4.0 Number of attempts: 1 Airway Equipment and Method: Bite block Placement Confirmation: positive ETCO2 Tube secured with: Tape Dental Injury: Teeth and Oropharynx as per pre-operative assessment

## 2021-05-14 NOTE — Anesthesia Postprocedure Evaluation (Signed)
Anesthesia Post Note  Patient: Brian Malone  Procedure(s) Performed: Right chest wound excision (Right Chest) possible skin graft and Matriderm (Right Chest)     Patient location during evaluation: PACU Anesthesia Type: General Level of consciousness: awake and alert Pain management: pain level controlled Vital Signs Assessment: post-procedure vital signs reviewed and stable Respiratory status: spontaneous breathing, nonlabored ventilation, respiratory function stable and patient connected to nasal cannula oxygen Cardiovascular status: blood pressure returned to baseline and stable Postop Assessment: no apparent nausea or vomiting Anesthetic complications: no   No complications documented.  Last Vitals:  Vitals:   05/14/21 0900 05/14/21 0905  BP: 100/68 100/66  Pulse: 68 71  Resp: 13   Temp:  (!) 36.4 C  SpO2: 100% 100%    Last Pain:  Vitals:   05/14/21 0900  TempSrc:   PainSc: 0-No pain                 Tiajuana Amass

## 2021-05-14 NOTE — Transfer of Care (Signed)
Immediate Anesthesia Transfer of Care Note  Patient: Brian Malone  Procedure(s) Performed: Right chest wound excision (Right Chest) possible skin graft and Matriderm (Right Chest)  Patient Location: PACU  Anesthesia Type:General  Level of Consciousness: awake, alert  and drowsy  Airway & Oxygen Therapy: Patient Spontanous Breathing  Post-op Assessment: Report given to RN, Post -op Vital signs reviewed and stable and Patient moving all extremities  Post vital signs: Reviewed and stable  Last Vitals:  Vitals Value Taken Time  BP 104/70 05/14/21 0829  Temp    Pulse 78 05/14/21 0831  Resp 56 05/14/21 0831  SpO2 100 % 05/14/21 0831  Vitals shown include unvalidated device data.  Last Pain:  Vitals:   05/14/21 0642  TempSrc:   PainSc: 3       Patients Stated Pain Goal: 3 (65/78/46 9629)  Complications: No complications documented.

## 2021-05-15 NOTE — Telephone Encounter (Signed)
Faxed orders to [facility] regarding wound vac care.

## 2021-05-20 ENCOUNTER — Encounter (HOSPITAL_COMMUNITY): Payer: Self-pay | Admitting: Plastic Surgery

## 2021-05-21 ENCOUNTER — Encounter (HOSPITAL_COMMUNITY): Payer: Self-pay | Admitting: Plastic Surgery

## 2021-05-22 ENCOUNTER — Ambulatory Visit: Payer: Medicare Other | Admitting: Thoracic Surgery (Cardiothoracic Vascular Surgery)

## 2021-05-22 ENCOUNTER — Telehealth: Payer: Self-pay | Admitting: Plastic Surgery

## 2021-05-22 ENCOUNTER — Other Ambulatory Visit: Payer: Self-pay

## 2021-05-22 ENCOUNTER — Encounter: Payer: Self-pay | Admitting: Plastic Surgery

## 2021-05-22 ENCOUNTER — Telehealth: Payer: Self-pay

## 2021-05-22 ENCOUNTER — Ambulatory Visit (INDEPENDENT_AMBULATORY_CARE_PROVIDER_SITE_OTHER): Payer: Medicare Other | Admitting: Plastic Surgery

## 2021-05-22 DIAGNOSIS — I2510 Atherosclerotic heart disease of native coronary artery without angina pectoris: Secondary | ICD-10-CM

## 2021-05-22 DIAGNOSIS — T8149XA Infection following a procedure, other surgical site, initial encounter: Secondary | ICD-10-CM | POA: Diagnosis not present

## 2021-05-22 NOTE — Telephone Encounter (Signed)
Patient lvm for Kirtland Bouchard to call [facility] with [facility] [phone].

## 2021-05-22 NOTE — Telephone Encounter (Signed)
Called and spoke with Heather with [facility] and gave her verbal orders for wound for the patient.    Wound Care Instructions:Adaptic                                           KY Jelly                                          Gauze  Frequency of Changes:Every other day to Right Chest Wound  Heather asked about placement of the dressing.  Informed her per Dr. Dillingham:Place the Adaptic on the wound, then apply KY Jelly on the Adaptic, and then cover with Gauze.  Heather verbalized understanding and agreed.    Nira Conn stated that she may be going to change the dressing 1x/week, and have the patient change the dressing the other days.//AB/CMA

## 2021-05-22 NOTE — Telephone Encounter (Signed)
Called and spoke with Heather with [facility] and gave her verbal orders for wound for the patient.    Wound Care Instructions:Adaptic                                           KY Jelly                                          Gauze  Frequency of Changes:Every other day to Right Chest Wound  Heather asked about placement of the dressing.  Informed her per Dr. Dillingham:Place the Adaptic on the wound, then apply KY Jelly on the Adaptic, and then cover with Gauze.  Heather verbalized understanding and agreed.    Nira Conn stated that she may be going to change the dressing 1x/week, and have the patient change the dressing the other days.//AB/CMA

## 2021-05-22 NOTE — Telephone Encounter (Signed)
Heather called from [facility] to say that the patient called to let her know that he saw Dr. Marla Roe today and they said that [facility] would be providing a special kind of wound care, not a wound vac.  Patient asked her to go ahead and call us.  Please call Heather back at [phone].  Heather's fax number is [phone].

## 2021-05-22 NOTE — Progress Notes (Signed)
   Subjective:    Patient ID: Brian Malone, male    DOB: 08-10-1951, 70 y.o.   MRN: [id number]  Is a 70 year old male here for follow-up on his right chest wound.  Overall he is doing really well.  The back has been in place.  He has some really good granulation tissue.  With the patient's permission I took a picture and it in the chart.  It was interesting that the area that had the [employer].  The area that had the myriad had not yet incorporated.      Review of Systems  Constitutional: Negative.   Eyes: Negative.   Respiratory: Negative.   Cardiovascular: Negative.  Negative for leg swelling.  Gastrointestinal: Negative.  Negative for abdominal distention.  Endocrine: Negative.   Genitourinary: Negative.   Musculoskeletal: Negative.        Objective:   Physical Exam Vitals and nursing note reviewed.  Constitutional:      Appearance: Normal appearance.  HENT:     Head: Normocephalic and atraumatic.  Cardiovascular:     Rate and Rhythm: Normal rate.     Pulses: Normal pulses.  Pulmonary:     Effort: Pulmonary effort is normal.  Chest:    Abdominal:     General: Abdomen is flat. There is no distension.  Neurological:     Mental Status: He is alert. Mental status is at baseline.  Psychiatric:        Mood and Affect: Mood normal.        Behavior: Behavior normal.        Assessment & Plan:     ICD-10-CM   1. Wound infection after surgery  T81.49XA     Donated Maitri Derm was applied to the same area it had been previously.  An Adaptic and KY dressing was placed.   Plan for split-thickness skin graft placement and back.  Pictures were obtained of the patient and placed in the chart with the patient's or guardian's permission.

## 2021-05-25 ENCOUNTER — Other Ambulatory Visit: Payer: Self-pay | Admitting: Infectious Diseases

## 2021-05-25 ENCOUNTER — Telehealth: Payer: Self-pay

## 2021-05-25 DIAGNOSIS — M8610 Other acute osteomyelitis, unspecified site: Secondary | ICD-10-CM

## 2021-05-25 NOTE — Telephone Encounter (Addendum)
Returned patients call. He indicated the nurse came by today and did not have the supplies that were on the order that was given late Friday afternoon. She attempted to change dressings with what she had, but patient denied service since she did not have the correct supplies. At this time he has been subsittuting with a Telfa pad, ky jelly and covering with gauze. Called [place] office in [place] and spoke with [place]. She verified the order has been in place and should arrive tomorrow or, Weds. Patient has another appointment this week and will be given the proper care and supplies.  Spoke with [place], verified patient can continue using Telfa pads, KY Jelly and gauze until nurse comes back with supplies this week. Attempted to call patient back several times today to advise, but phone would ring once, and disconnect.

## 2021-05-25 NOTE — Telephone Encounter (Signed)
Patient has appt 05/26/21

## 2021-05-25 NOTE — Telephone Encounter (Signed)
Patient asked that we please call him regarding dressing change and home health nursing.

## 2021-05-26 ENCOUNTER — Encounter: Payer: Medicare Other | Admitting: Plastic Surgery

## 2021-05-26 ENCOUNTER — Other Ambulatory Visit: Payer: Self-pay

## 2021-05-26 ENCOUNTER — Ambulatory Visit (INDEPENDENT_AMBULATORY_CARE_PROVIDER_SITE_OTHER): Payer: Medicare Other | Admitting: Infectious Diseases

## 2021-05-26 ENCOUNTER — Encounter: Payer: Self-pay | Admitting: Infectious Diseases

## 2021-05-26 VITALS — BP 122/74 | HR 78 | Temp 97.7°F | Wt 160.0 lb

## 2021-05-26 DIAGNOSIS — B20 Human immunodeficiency virus [HIV] disease: Secondary | ICD-10-CM | POA: Diagnosis not present

## 2021-05-26 DIAGNOSIS — B182 Chronic viral hepatitis C: Secondary | ICD-10-CM | POA: Diagnosis not present

## 2021-05-26 DIAGNOSIS — M8610 Other acute osteomyelitis, unspecified site: Secondary | ICD-10-CM | POA: Diagnosis not present

## 2021-05-26 DIAGNOSIS — L02213 Cutaneous abscess of chest wall: Secondary | ICD-10-CM | POA: Diagnosis present

## 2021-05-26 NOTE — Progress Notes (Signed)
[facility]                                                             [place], [place], [place], [address]                                                                  Phn. [phone]; Fax: [phone]                                                                             Date: 05/26/21  Reason for Referral: HFU for chest wall abscess  Assessment Problem List Items Addressed This Visit      Digestive   Chronic hepatitis C without hepatic coma ([place])     Musculoskeletal and Integument   Osteomyelitis ([place])     Other   Abscess of chest wall - Primary   HIV infection ([place])      Right chest wall abscess status post ultrasound-guided drain placement by IR 3/26 and chest wall debridement and wound VAC placement 3/31 by CV surgery.  Cultures positive for MRSA. Status post repeat chest wall debridement and wound VAC change 4/6. Has completed 3 weeks of IV daptomycin on 4/21 followed by 1 day of Linezolid then Doxycycline from 4/27 until today   Anterior right sixth rib osteomyelitis  HIV well-controlled, HIV RNA undetectable, follows up with a provider at [facility], on Triumeq  Hepatitis C-completed 8 weeks of mavyret. Follows up with a provider at [facility]  No need for further antibiotics. DC doxycycline from today Fu with Plastics  Fu as needed  All questions and concerns were discussed and addressed. Patient verbalized understanding of the plan. ____________________________________________________________________________________________________________________  HPI: 70 YO male with PMH of Hepatitis C, HIV, HTN, HLD, DM2 who is here for a HFU after recent hospital admission ( 3/25-4/22) for rt chest wall abscess/osteomyelitis. Seen by myself in the hospital and Ct SX. Patient had ultrasound-guided drain placement by IR 3/26 and chest wall debridement and  wound VAC placement 3/31 by CV surgery.  Cultures positive for MRSA. Underwent repeat chest wall debridement and wound VAC change 4/6. Patient was discharged on 4/22 after receiving 3 weeks of IV daptomycin on Line zolid for remaining 3 weeks. Patient had a ED visit one day following discharge on 4/23 with vomiting with concerns for possible side effects related to Linezolid vs withdrawal from fentanyl patch. He was managed after conservative management with IVF, analgesics. He called [facility] office with request to switch antibiotics and hence I recommended to start taking Doxycyline which he started taking from 4/27. He has been taking Doxycycline 100mg  PO BID from 4/27 and has been tolerating it well without any issues. Recently seen by [facility] 4/29 where  the chest wound was thought to be healing well. He was referred to plastics for possible need of split thickness skin graft. He was seen by Plastics Dr Marla Roe on 5/3 and patient says he has been told for skin graft in next couple of weeks.   He showed me pictures of his chest wall wound which looks non infected. Denies any significant pain/tenderness/swelling from the wound site. Denies any fevers, chills and night sweats. No issues with the antibiotics.   He has completed tx for HCV, taking triumeq for HIV daily. Follows with OD provider at [facility] where he intends to continue his HCV and HIV care.   05/26/21 Here for follow-up right chest wall abscess/osteomyelitis.  He has been taking doxycycline until today since last clinic visit.  Has been having some nausea which is controlled with the help of Zofran.  He was seen by Dr. Marla Roe on 5/26 and had excision/debridement of this chest wound with placement of vac. he was seen by Dr. Marla Roe on 6/3 and was thought to be doing well.  There is a plan for split thickness skin graft in the near future.   Denies any fever chills and sweats.  Denies any vomiting abdominal pain and diarrhea.   Denies any drainage from the chest wound. He is taking ART for HIV and is going to follow up with his ID provider for HIV and hepatitis C care   ROS: Constitutional: Negative for fever, chills, activity change, appetite change, fatigue and unexpected weight change.  HENT: Negative for congestion, sore throat, rhinorrhea, sneezing, trouble swallowing and sinus pressure.  Eyes: Negative for photophobia and visual disturbance.  Respiratory: Negative for cough, chest tightness, shortness of breath, wheezing and stridor.  Cardiovascular: Negative for chest pain, palpitations and leg swelling.  Gastrointestinal: Negative for nausea, vomiting, abdominal pain, diarrhea, constipation, blood in stool, abdominal distention and anal bleeding.  Genitourinary: Negative for dysuria, hematuria, flank pain and difficulty urinating.  Musculoskeletal: Negative for myalgias, back pain, joint swelling, arthralgias and gait problem.  Skin: Negative for color change, pallor, rash and wound.  Neurological: Negative for dizziness, tremors, weakness and light-headedness.  Hematological: Negative for adenopathy. Does not bruise/bleed easily.  Psychiatric/Behavioral: Negative for behavioral problems, confusion, sleep disturbance, dysphoric mood, decreased concentration and agitation.   Past Medical History:  Diagnosis Date  . Acute subdural hematoma ([place]) 10/28/2019  . Anxiety   . Arthritis   . 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 Cellulitis of right upper extremity 11/04/2020  . Depression   . Diabetes mellitus without complication ([place])   . Dysphagia   . GERD (gastroesophageal reflux disease)   . Hepatitis C   . High cholesterol   . History of COVID-19 05/26/2020  . History of kidney stones   . HIV (human immunodeficiency virus infection) ([place])   . Hypertension   . Pneumonia   . Renal disorder   . Tachycardia    Past Surgical History:  Procedure Laterality Date  . ANKLE ARTHROSCOPY    . APPENDECTOMY     . APPLICATION OF WOUND VAC Right 03/25/2021   Procedure: APPLICATION OF WOUND VAC;  Surgeon: Lajuana Matte, MD;  Location: [facility];  Service: Thoracic;  Laterality: Right;  . BACK SURGERY     FUSION  . DEBRIDEMENT AND CLOSURE WOUND Right 05/14/2021   Procedure: Right chest wound excision;  Surgeon: Wallace Going, DO;  Location: [facility];  Service: Plastics;  Laterality: Right;  . INCISION AND  DRAINAGE Right 03/25/2021   Procedure: INCISION AND DRAINAGE OF CHEST WALL;  Surgeon: Lajuana Matte, MD;  Location: [facility];  Service: Thoracic;  Laterality: Right;  . INCISION AND DRAINAGE ABSCESS Right 03/19/2021   Procedure: INCISION AND DRAINAGE RIGHT CHEST ABSCESS;  Surgeon: Lajuana Matte, MD;  Location: [facility];  Service: Vascular;  Laterality: Right;  . IR US GUIDE BX ASP/DRAIN  03/14/2021  . IR US GUIDE BX ASP/DRAIN  03/14/2021  . PENILE PROSTHESIS  REMOVAL  05/2020   Removal due to abscess  . PENILE PROSTHESIS PLACEMENT  04/2020  . SKIN SPLIT GRAFT Right 05/14/2021   Procedure: possible skin graft and Matriderm;  Surgeon: Wallace Going, DO;  Location: [facility];  Service: Plastics;  Laterality: Right;  . THORACOTOMY Right 2008  ?  . TONSILLECTOMY      Current Outpatient Medications on File Prior to Visit  Medication Sig Dispense Refill  . busPIRone (BUSPAR) 30 MG tablet Take 30 mg by mouth 2 (two) times daily.    . clonazePAM (KLONOPIN) 0.5 MG tablet Take 0.5 mg by mouth in the morning, at noon, in the evening, and at bedtime.    Marland Kitchen ezetimibe (ZETIA) 10 MG tablet Take 10 mg by mouth daily.    . feeding supplement (ENSURE ENLIVE / ENSURE PLUS) LIQD Take 237 mLs by mouth 2 (two) times daily between meals. 237 mL 12  . feeding supplement, GLUCERNA SHAKE, (GLUCERNA SHAKE) LIQD Take 237 mLs by mouth daily. 237 mL 12  . fentaNYL (DURAGESIC) 25 MCG/HR Place 1 patch onto the skin every 3 (three) days. 5 patch 0  . isosorbide mononitrate (IMDUR) 60 MG 24 hr tablet Take 60 mg by mouth  daily.    Marland Kitchen loratadine (CLARITIN) 10 MG tablet Take 10 mg by mouth daily.     . ondansetron (ZOFRAN-ODT) 8 MG disintegrating tablet Take 8 mg by mouth every 8 (eight) hours as needed for nausea or vomiting.    Marland Kitchen oxyCODONE-acetaminophen (PERCOCET) 10-325 MG tablet Take 1 tablet by mouth every 6 (six) hours as needed for pain.    . pantoprazole (PROTONIX) 40 MG tablet Take 40 mg by mouth daily before breakfast.     . polyethylene glycol (MIRALAX / GLYCOLAX) 17 g packet Take 17 g by mouth daily as needed for moderate constipation.    . propranolol ER (INDERAL LA) 60 MG 24 hr capsule Take 60 mg by mouth daily.    . Semaglutide,0.25 or 0.5MG /DOS, 2 MG/1.5ML SOPN Inject 0.5 mg into the skin every Friday.     . senna-docusate (SENOKOT-S) 8.6-50 MG tablet Take 2 tablets by mouth at bedtime as needed for mild constipation. (Patient taking differently: Take 1 tablet by mouth 2 (two) times daily.)    . tamsulosin (FLOMAX) 0.4 MG CAPS capsule Take 0.4 mg by mouth at bedtime.     Marland Kitchen tiZANidine (ZANAFLEX) 4 MG tablet Take 2-4 mg by mouth at bedtime.     No current facility-administered medications on file prior to visit.     Allergies  Allergen Reactions  . Adhesive [Tape] Other (See Comments)    "Tape will take off my skin, as will Band-Aids"  . Cyclobenzaprine Other (See Comments)    Hallucinations  . Duloxetine Hcl Other (See Comments)    Makes PTSD- induced nightmares more vivid   . Morphine Itching and Other (See Comments)    Hallucinations, aggression, and makes patient altered also    . Zyvox [Linezolid] Nausea And Vomiting  And headaches  . Pregabalin Anxiety    Pt reports insomnia and worsening anxiety    Social History   Socioeconomic History  . Marital status: Divorced    Spouse name: Not on file  . Number of children: 2  . Years of education: Not on file  . Highest education level: Bachelor's degree (e.g., BA, AB, BS)  Occupational History  . Not on file  Tobacco Use  .  Smoking status: Never Smoker  . Smokeless tobacco: Never Used  Vaping Use  . Vaping Use: Never used  Substance and Sexual Activity  . Alcohol use: Not Currently  . Drug use: Never  . Sexual activity: Not on file  Other Topics Concern  . Not on file  Social History Narrative   Pt is divorced   2 children   Right handed   Drinks soda, no coffee, no tea    Lives on the third floor of an apartment bldg. One story studio apartment   Social Determinants of Health   Financial Resource Strain: Not on file  Food Insecurity: Not on file  Transportation Needs: Not on file  Physical Activity: Not on file  Stress: Not on file  Social Connections: Not on file  Intimate Partner Violence: Not on file     Vitals BP 122/74   Pulse 78   Temp 97.7 F (36.5 C)   Wt 160 lb (72.6 kg)   SpO2 98%   BMI 21.70 kg/m    Examination  General - not in acute distress, comfortably sitting in chair HEENT - PEERLA, no pallor and no icterus Chest - b/l clear air entry, no additional sounds CVS- Normal s1s2, RRR Abdomen - Soft, Non tender , non distended Ext- no pedal edema Neuro: grossly normal Back - WNL Psych : calm and cooperative Skin:        Recent labs CBC Latest Ref Rng & Units 05/14/2021 04/11/2021 04/07/2021  WBC 4.0 - 10.5 K/uL 4.9 6.8 5.6  Hemoglobin 13.0 - 17.0 g/dL 11.4(L) 10.6(L) 9.6(L)  Hematocrit 39.0 - 52.0 % 34.6(L) 32.8(L) 29.6(L)  Platelets 150 - 400 K/uL 165 266 218   CMP Latest Ref Rng & Units 05/14/2021 04/11/2021 04/07/2021  Glucose 70 - 99 mg/dL 155(H) 164(H) 140(H)  BUN 8 - 23 mg/dL 27(H) 42(H) 29(H)  Creatinine 0.61 - 1.24 mg/dL 1.26(H) 1.35(H) 1.36(H)  Sodium 135 - 145 mmol/L 137 133(L) 137  Potassium 3.5 - 5.1 mmol/L 4.3 4.7 4.6  Chloride 98 - 111 mmol/L 103 97(L) 100  CO2 22 - 32 mmol/L 26 24 29   Calcium 8.9 - 10.3 mg/dL 9.1 9.3 9.1  Total Protein 6.5 - 8.1 g/dL 6.5 8.1 -  Total Bilirubin 0.3 - 1.2 mg/dL 0.9 0.5 -  Alkaline Phos 38 - 126 U/L 79 98 -   AST 15 - 41 U/L 24 24 -  ALT 0 - 44 U/L 16 14 -    Pertinent Microbiology Results for orders placed or performed during the hospital encounter of 05/14/21  Surgical pcr screen     Status: None   Collection Time: 05/14/21  6:58 AM   Specimen: Nasal Mucosa; Nasal Swab  Result Value Ref Range Status   MRSA, PCR NEGATIVE NEGATIVE Final   Staphylococcus aureus NEGATIVE NEGATIVE Final    Comment: (NOTE) The Xpert SA Assay (FDA approved for NASAL specimens in patients 4 years of age and older), is one component of a comprehensive surveillance program. It is not intended to diagnose infection nor to guide  or monitor treatment. Performed at [facility], [place] [address], [place], [place] [address]    Pertinent Imaging All pertinent labs/Imagings/notes reviewed. All pertinent plain films and CT images have been personally visualized and interpreted; radiology reports have been reviewed. Decision making incorporated into the Impression / Recommendations.  I have spent 60 minutes for this patient encounter including  review of prior medical records with greater than 50% of time in face to face counsel of the patient/discussing diagnostics and plan of care.   Electronically signed by:  Rosiland Oz, MD Infectious Disease Physician [facility] [address] [place], [place] [address] Phone: [phone]  Fax: [phone]

## 2021-05-26 NOTE — Telephone Encounter (Signed)
Patient has completed doxycycline correct?

## 2021-05-27 ENCOUNTER — Encounter: Payer: Self-pay | Admitting: Infectious Diseases

## 2021-05-29 ENCOUNTER — Ambulatory Visit: Payer: Self-pay | Admitting: Thoracic Surgery (Cardiothoracic Vascular Surgery)

## 2021-06-03 ENCOUNTER — Telehealth: Payer: Self-pay | Admitting: *Deleted

## 2021-06-03 NOTE — Telephone Encounter (Signed)
Received on (05/20/21) Orders from [facility].  Requesting signature,date, and return.  Given to the provider to complete.  Orders signed,dated and faxed back to [facility].  Confirmation received and copy scanned into the chart.//AB/CMA

## 2021-06-09 ENCOUNTER — Other Ambulatory Visit: Payer: Self-pay

## 2021-06-09 ENCOUNTER — Ambulatory Visit (INDEPENDENT_AMBULATORY_CARE_PROVIDER_SITE_OTHER): Payer: Medicare Other | Admitting: Surgical

## 2021-06-09 ENCOUNTER — Encounter: Payer: Self-pay | Admitting: Surgical

## 2021-06-09 DIAGNOSIS — S21101A Unspecified open wound of right front wall of thorax without penetration into thoracic cavity, initial encounter: Secondary | ICD-10-CM

## 2021-06-09 DIAGNOSIS — T8149XA Infection following a procedure, other surgical site, initial encounter: Secondary | ICD-10-CM

## 2021-06-09 MED ORDER — ONDANSETRON HCL 4 MG PO TABS: 4.0000 mg | ORAL_TABLET | Freq: Three times a day (TID) | ORAL | 0 refills | Status: DC | PRN

## 2021-06-09 MED ORDER — DOXYCYCLINE HYCLATE 100 MG PO TABS: 100.0000 mg | ORAL_TABLET | Freq: Two times a day (BID) | ORAL | 0 refills | Status: AC

## 2021-06-09 NOTE — H&P (View-Only) (Signed)
Patient ID: Brian Malone, male    DOB: 1951/06/28, 70 y.o.   MRN: [id number]  Chief Complaint  Patient presents with   Post-op Follow-up       ICD-10-CM   1. Wound infection after surgery  T81.49XA     2. Open wound of right chest wall, initial encounter  S21.101A        History of Present Illness: Brian Malone is a 70 y.o.  male  with a history of right chest wound that he sustained after debridement of large chest wall hematoma and abscess.  He presents for preoperative evaluation for upcoming procedure, split-thickness skin graft to right chest wound, scheduled for 06/24/2021 with Dr. Marla Roe.  The patient has not had problems with anesthesia. No history of DVT/PE.  No family history of DVT/PE.  No family or personal history of bleeding or clotting disorders.  Patient is not currently taking any blood thinners.  No history of CVA/MI.    PMH Significant for: Hypertension, GERD, gout, HIV, hyperlipidemia, OSA, PTSD, diabetes mellitus, coronary artery disease.  Patient is currently on fentanyl patches and Percocet daily which he takes for his right chest wall/rib pain that he developed after his injury.  He had a culture of his right chest wall abscess on 03/19/2021 which showed MRSA.  He had an A1c checked on 03/14/2021 which was 8.3.  Patient reports she has been feeling well lately.  He has been continuing with dressing changes and feels as if things are going well.  He is ready for surgery.  He reports no change in his health status since his last visit.  He has previously been cleared by cardiology.  No recent fevers, chills, nausea, vomiting, chest pain, shortness of breath.  No dizziness or weakness.  past Medical History: Allergies: Allergies  Allergen Reactions   Adhesive [Tape] Other (See Comments)    "Tape will take off my skin, as will Band-Aids"   Cyclobenzaprine Other (See Comments)    Hallucinations   Duloxetine Hcl Other (See Comments)    Makes PTSD-  induced nightmares more vivid    Morphine Itching and Other (See Comments)    Hallucinations, aggression, and makes patient altered also     Zyvox [Linezolid] Nausea And Vomiting    And headaches   Pregabalin Anxiety    Pt reports insomnia and worsening anxiety     Current Medications:  Current Outpatient Medications:    busPIRone (BUSPAR) 30 MG tablet, Take 30 mg by mouth 2 (two) times daily., Disp: , Rfl:    clonazePAM (KLONOPIN) 0.5 MG tablet, Take 0.5 mg by mouth in the morning, at noon, in the evening, and at bedtime., Disp: , Rfl:    ezetimibe (ZETIA) 10 MG tablet, Take 10 mg by mouth daily., Disp: , Rfl:    feeding supplement (ENSURE ENLIVE / ENSURE PLUS) LIQD, Take 237 mLs by mouth 2 (two) times daily between meals., Disp: 237 mL, Rfl: 12   feeding supplement, GLUCERNA SHAKE, (GLUCERNA SHAKE) LIQD, Take 237 mLs by mouth daily., Disp: 237 mL, Rfl: 12   fentaNYL (DURAGESIC) 25 MCG/HR, Place 1 patch onto the skin every 3 (three) days., Disp: 5 patch, Rfl: 0   isosorbide mononitrate (IMDUR) 60 MG 24 hr tablet, Take 60 mg by mouth daily., Disp: , Rfl:    loratadine (CLARITIN) 10 MG tablet, Take 10 mg by mouth daily. , Disp: , Rfl:    ondansetron (ZOFRAN-ODT) 8 MG disintegrating tablet, Take 8 mg by  mouth every 8 (eight) hours as needed for nausea or vomiting., Disp: , Rfl:    pantoprazole (PROTONIX) 40 MG tablet, Take 40 mg by mouth daily before breakfast. , Disp: , Rfl:    polyethylene glycol (MIRALAX / GLYCOLAX) 17 g packet, Take 17 g by mouth daily as needed for moderate constipation., Disp: , Rfl:    propranolol ER (INDERAL LA) 60 MG 24 hr capsule, Take 60 mg by mouth daily., Disp: , Rfl:    Semaglutide,0.25 or 0.5MG /DOS, 2 MG/1.5ML SOPN, Inject 0.5 mg into the skin every Friday. , Disp: , Rfl:    senna-docusate (SENOKOT-S) 8.6-50 MG tablet, Take 2 tablets by mouth at bedtime as needed for mild constipation. (Patient taking differently: Take 1 tablet by mouth 2 (two) times daily.),  Disp: , Rfl:    tamsulosin (FLOMAX) 0.4 MG CAPS capsule, Take 0.4 mg by mouth at bedtime. , Disp: , Rfl:    tiZANidine (ZANAFLEX) 4 MG tablet, Take 2-4 mg by mouth at bedtime., Disp: , Rfl:    oxyCODONE-acetaminophen (PERCOCET) 10-325 MG tablet, Take 1 tablet by mouth every 6 (six) hours as needed for pain. (Patient not taking: Reported on 06/09/2021), Disp: , Rfl:   Past Medical Problems: Past Medical History:  Diagnosis Date   Acute subdural hematoma ([place]) 10/28/2019   Anxiety    Arthritis    Cancer (HCC)    basal cell on nose   Cellulitis of right upper extremity 11/04/2020   Depression    Diabetes mellitus without complication (HCC)    Dysphagia    GERD (gastroesophageal reflux disease)    Hepatitis C    High cholesterol    History of COVID-19 05/26/2020   History of kidney stones    HIV (human immunodeficiency virus infection) ([place])    Hypertension    Pneumonia    Renal disorder    Tachycardia     Past Surgical History: Past Surgical History:  Procedure Laterality Date   ANKLE ARTHROSCOPY     APPENDECTOMY     APPLICATION OF WOUND VAC Right 03/25/2021   Procedure: APPLICATION OF WOUND VAC;  Surgeon: Lajuana Matte, MD;  Location: [place];  Service: Thoracic;  Laterality: Right;   BACK SURGERY     FUSION   DEBRIDEMENT AND CLOSURE WOUND Right 05/14/2021   Procedure: Right chest wound excision;  Surgeon: Wallace Going, DO;  Location: [place];  Service: Plastics;  Laterality: Right;   INCISION AND DRAINAGE Right 03/25/2021   Procedure: INCISION AND DRAINAGE OF CHEST WALL;  Surgeon: Lajuana Matte, MD;  Location: [place];  Service: Thoracic;  Laterality: Right;   INCISION AND DRAINAGE ABSCESS Right 03/19/2021   Procedure: INCISION AND DRAINAGE RIGHT CHEST ABSCESS;  Surgeon: Lajuana Matte, MD;  Location: [place];  Service: Vascular;  Laterality: Right;   IR US GUIDE BX ASP/DRAIN  03/14/2021   IR US GUIDE BX ASP/DRAIN  03/14/2021   PENILE PROSTHESIS  REMOVAL  05/2020    Removal due to abscess   PENILE PROSTHESIS PLACEMENT  04/2020   SKIN SPLIT GRAFT Right 05/14/2021   Procedure: possible skin graft and Matriderm;  Surgeon: Wallace Going, DO;  Location: [place];  Service: Plastics;  Laterality: Right;   THORACOTOMY Right 2008  ?   TONSILLECTOMY      Social History: Social History   Socioeconomic History   Marital status: Divorced    Spouse name: Not on file   Number of children: 2   Years of education:  Not on file   Highest education level: Bachelor's degree (e.g., BA, AB, BS)  Occupational History   Not on file  Tobacco Use   Smoking status: Never   Smokeless tobacco: Never  Vaping Use   Vaping Use: Never used  Substance and Sexual Activity   Alcohol use: Not Currently   Drug use: Never   Sexual activity: Not on file  Other Topics Concern   Not on file  Social History Narrative   Pt is divorced   2 children   Right handed   Drinks soda, no coffee, no tea    Lives on the third floor of an apartment bldg. One story studio apartment   Social Determinants of Health   Financial Resource Strain: Not on file  Food Insecurity: Not on file  Transportation Needs: Not on file  Physical Activity: Not on file  Stress: Not on file  Social Connections: Not on file  Intimate Partner Violence: Not on file    Family History: Family History  Problem Relation Age of Onset   CAD Mother    Lung cancer Mother    CAD Father    Lung cancer Father    CAD Brother     Review of Systems: Review of Systems  Constitutional: Negative.   Respiratory: Negative.    Cardiovascular: Negative.   Gastrointestinal: Negative.   Neurological: Negative.    Physical Exam: Vital Signs There were no vitals taken for this visit.  Physical Exam Constitutional:      General: Not in acute distress.    Appearance: Normal appearance. Not ill-appearing.  HENT:     Head: Normocephalic and atraumatic.  Eyes:     Pupils: Pupils are equal, round Neck:      Musculoskeletal: Normal range of motion.  Cardiovascular:     Rate and Rhythm: Normal rate    Pulses: Normal pulses.  Chest: Wound is healing nicely with new epithelium noted at the edges.  No surrounding erythema.  No purulence noted. Pulmonary:     Effort: Pulmonary effort is normal. No respiratory distress.  Abdominal:     General: Abdomen is flat. There is no distension.  Musculoskeletal: Normal range of motion.  Skin:    General: Skin is warm and dry.     Findings: No erythema or rash.  Neurological:     General: No focal deficit present.     Mental Status: Alert and oriented to person, place, and time. Mental status is at baseline.     Motor: No weakness.  Psychiatric:        Mood and Affect: Mood normal.        Behavior: Behavior normal.    Assessment/Plan: The patient is scheduled for split-thickness skin graft to right chest wall wound, with Dr. Marla Roe.  Risks, benefits, and alternatives of procedure discussed, questions answered and consent obtained.    Smoking Status: Non-smoker; Counseling Given?  N/A  Caprini Score: 4; Risk Factors include: Age, and length of planned surgery. Recommendation for mechanical and pharmacological prophylaxis while hospitalized. Encourage early ambulation.   Pictures obtained: 05/26/2021  Post-op Rx sent to pharmacy: Zofran, doxycycline.  Patient is comfortable continuing his chronic fentanyl and Percocet for pain control after surgery.  Patient was provided with the General Surgical Risk consent document and Pain Medication Agreement prior to their appointment.  They had adequate time to read through the risk consent documents and Pain Medication Agreement. We also discussed them in person together during this preop appointment. All of  their questions were answered to their satisfaction.  Recommended calling if they have any further questions.  Risk consent form and Pain Medication Agreement to be scanned into patient's chart.  The  risks that can be encountered with and after a skin graft were discussed and include the following but not limited to these: bleeding, infection, delayed healing, anesthesia risks, skin sensation changes, injury to structures including nerves, blood vessels, and muscles which may be temporary or permanent, allergies to tape, suture materials and glues, blood products, topical preparations or injected agents, skin contour irregularities, skin discoloration and swelling, deep vein thrombosis, cardiac and pulmonary complications, pain, which may persist, failure of the graft and possible need for revisional surgery or staged procedures.  Patient is understanding of that with his diabetes and comorbidities that he is at an increased risk of postoperative complications and delayed healing.  We discussed the risks specific to a split-thickness skin graft which include need for additional procedures, failure of the graft to take, pain to the donor site.  Electronically signed by: Carola Rhine Shelby Peltz, PA-C 06/09/2021 12:31 PM

## 2021-06-09 NOTE — Progress Notes (Signed)
Patient ID: Brian Malone, male    DOB: Nov 02, 1951, 70 y.o.   MRN: [id number]  Chief Complaint  Patient presents with   Post-op Follow-up       ICD-10-CM   1. Wound infection after surgery  T81.49XA     2. Open wound of right chest wall, initial encounter  S21.101A        History of Present Illness: Brian Malone is a 70 y.o.  male  with a history of right chest wound that he sustained after debridement of large chest wall hematoma and abscess.  He presents for preoperative evaluation for upcoming procedure, split-thickness skin graft to right chest wound, scheduled for 06/24/2021 with Dr. Marla Roe.  The patient has not had problems with anesthesia. No history of DVT/PE.  No family history of DVT/PE.  No family or personal history of bleeding or clotting disorders.  Patient is not currently taking any blood thinners.  No history of CVA/MI.    PMH Significant for: Hypertension, GERD, gout, HIV, hyperlipidemia, OSA, PTSD, diabetes mellitus, coronary artery disease.  Patient is currently on fentanyl patches and Percocet daily which he takes for his right chest wall/rib pain that he developed after his injury.  He had a culture of his right chest wall abscess on 03/19/2021 which showed MRSA.  He had an A1c checked on 03/14/2021 which was 8.3.  Patient reports she has been feeling well lately.  He has been continuing with dressing changes and feels as if things are going well.  He is ready for surgery.  He reports no change in his health status since his last visit.  He has previously been cleared by cardiology.  No recent fevers, chills, nausea, vomiting, chest pain, shortness of breath.  No dizziness or weakness.  past Medical History: Allergies: Allergies  Allergen Reactions   Adhesive [Tape] Other (See Comments)    "Tape will take off my skin, as will Band-Aids"   Cyclobenzaprine Other (See Comments)    Hallucinations   Duloxetine Hcl Other (See Comments)    Makes PTSD-  induced nightmares more vivid    Morphine Itching and Other (See Comments)    Hallucinations, aggression, and makes patient altered also     Zyvox [Linezolid] Nausea And Vomiting    And headaches   Pregabalin Anxiety    Pt reports insomnia and worsening anxiety     Current Medications:  Current Outpatient Medications:    busPIRone (BUSPAR) 30 MG tablet, Take 30 mg by mouth 2 (two) times daily., Disp: , Rfl:    clonazePAM (KLONOPIN) 0.5 MG tablet, Take 0.5 mg by mouth in the morning, at noon, in the evening, and at bedtime., Disp: , Rfl:    ezetimibe (ZETIA) 10 MG tablet, Take 10 mg by mouth daily., Disp: , Rfl:    feeding supplement (ENSURE ENLIVE / ENSURE PLUS) LIQD, Take 237 mLs by mouth 2 (two) times daily between meals., Disp: 237 mL, Rfl: 12   feeding supplement, GLUCERNA SHAKE, (GLUCERNA SHAKE) LIQD, Take 237 mLs by mouth daily., Disp: 237 mL, Rfl: 12   fentaNYL (DURAGESIC) 25 MCG/HR, Place 1 patch onto the skin every 3 (three) days., Disp: 5 patch, Rfl: 0   isosorbide mononitrate (IMDUR) 60 MG 24 hr tablet, Take 60 mg by mouth daily., Disp: , Rfl:    loratadine (CLARITIN) 10 MG tablet, Take 10 mg by mouth daily. , Disp: , Rfl:    ondansetron (ZOFRAN-ODT) 8 MG disintegrating tablet, Take 8 mg by  mouth every 8 (eight) hours as needed for nausea or vomiting., Disp: , Rfl:    pantoprazole (PROTONIX) 40 MG tablet, Take 40 mg by mouth daily before breakfast. , Disp: , Rfl:    polyethylene glycol (MIRALAX / GLYCOLAX) 17 g packet, Take 17 g by mouth daily as needed for moderate constipation., Disp: , Rfl:    propranolol ER (INDERAL LA) 60 MG 24 hr capsule, Take 60 mg by mouth daily., Disp: , Rfl:    Semaglutide,0.25 or 0.5MG /DOS, 2 MG/1.5ML SOPN, Inject 0.5 mg into the skin every Friday. , Disp: , Rfl:    senna-docusate (SENOKOT-S) 8.6-50 MG tablet, Take 2 tablets by mouth at bedtime as needed for mild constipation. (Patient taking differently: Take 1 tablet by mouth 2 (two) times daily.),  Disp: , Rfl:    tamsulosin (FLOMAX) 0.4 MG CAPS capsule, Take 0.4 mg by mouth at bedtime. , Disp: , Rfl:    tiZANidine (ZANAFLEX) 4 MG tablet, Take 2-4 mg by mouth at bedtime., Disp: , Rfl:    oxyCODONE-acetaminophen (PERCOCET) 10-325 MG tablet, Take 1 tablet by mouth every 6 (six) hours as needed for pain. (Patient not taking: Reported on 06/09/2021), Disp: , Rfl:   Past Medical Problems: Past Medical History:  Diagnosis Date   Acute subdural hematoma ([place]) 10/28/2019   Anxiety    Arthritis    Cancer (HCC)    basal cell on nose   Cellulitis of right upper extremity 11/04/2020   Depression    Diabetes mellitus without complication (HCC)    Dysphagia    GERD (gastroesophageal reflux disease)    Hepatitis C    High cholesterol    History of COVID-19 05/26/2020   History of kidney stones    HIV (human immunodeficiency virus infection) ([place])    Hypertension    Pneumonia    Renal disorder    Tachycardia     Past Surgical History: Past Surgical History:  Procedure Laterality Date   ANKLE ARTHROSCOPY     APPENDECTOMY     APPLICATION OF WOUND VAC Right 03/25/2021   Procedure: APPLICATION OF WOUND VAC;  Surgeon: Lajuana Matte, MD;  Location: [place];  Service: Thoracic;  Laterality: Right;   BACK SURGERY     FUSION   DEBRIDEMENT AND CLOSURE WOUND Right 05/14/2021   Procedure: Right chest wound excision;  Surgeon: Wallace Going, DO;  Location: [place];  Service: Plastics;  Laterality: Right;   INCISION AND DRAINAGE Right 03/25/2021   Procedure: INCISION AND DRAINAGE OF CHEST WALL;  Surgeon: Lajuana Matte, MD;  Location: [place];  Service: Thoracic;  Laterality: Right;   INCISION AND DRAINAGE ABSCESS Right 03/19/2021   Procedure: INCISION AND DRAINAGE RIGHT CHEST ABSCESS;  Surgeon: Lajuana Matte, MD;  Location: [place];  Service: Vascular;  Laterality: Right;   IR US GUIDE BX ASP/DRAIN  03/14/2021   IR US GUIDE BX ASP/DRAIN  03/14/2021   PENILE PROSTHESIS  REMOVAL  05/2020    Removal due to abscess   PENILE PROSTHESIS PLACEMENT  04/2020   SKIN SPLIT GRAFT Right 05/14/2021   Procedure: possible skin graft and Matriderm;  Surgeon: Wallace Going, DO;  Location: [place];  Service: Plastics;  Laterality: Right;   THORACOTOMY Right 2008  ?   TONSILLECTOMY      Social History: Social History   Socioeconomic History   Marital status: Divorced    Spouse name: Not on file   Number of children: 2   Years of education:  Not on file   Highest education level: Bachelor's degree (e.g., BA, AB, BS)  Occupational History   Not on file  Tobacco Use   Smoking status: Never   Smokeless tobacco: Never  Vaping Use   Vaping Use: Never used  Substance and Sexual Activity   Alcohol use: Not Currently   Drug use: Never   Sexual activity: Not on file  Other Topics Concern   Not on file  Social History Narrative   Pt is divorced   2 children   Right handed   Drinks soda, no coffee, no tea    Lives on the third floor of an apartment bldg. One story studio apartment   Social Determinants of Health   Financial Resource Strain: Not on file  Food Insecurity: Not on file  Transportation Needs: Not on file  Physical Activity: Not on file  Stress: Not on file  Social Connections: Not on file  Intimate Partner Violence: Not on file    Family History: Family History  Problem Relation Age of Onset   CAD Mother    Lung cancer Mother    CAD Father    Lung cancer Father    CAD Brother     Review of Systems: Review of Systems  Constitutional: Negative.   Respiratory: Negative.    Cardiovascular: Negative.   Gastrointestinal: Negative.   Neurological: Negative.    Physical Exam: Vital Signs There were no vitals taken for this visit.  Physical Exam Constitutional:      General: Not in acute distress.    Appearance: Normal appearance. Not ill-appearing.  HENT:     Head: Normocephalic and atraumatic.  Eyes:     Pupils: Pupils are equal, round Neck:      Musculoskeletal: Normal range of motion.  Cardiovascular:     Rate and Rhythm: Normal rate    Pulses: Normal pulses.  Chest: Wound is healing nicely with new epithelium noted at the edges.  No surrounding erythema.  No purulence noted. Pulmonary:     Effort: Pulmonary effort is normal. No respiratory distress.  Abdominal:     General: Abdomen is flat. There is no distension.  Musculoskeletal: Normal range of motion.  Skin:    General: Skin is warm and dry.     Findings: No erythema or rash.  Neurological:     General: No focal deficit present.     Mental Status: Alert and oriented to person, place, and time. Mental status is at baseline.     Motor: No weakness.  Psychiatric:        Mood and Affect: Mood normal.        Behavior: Behavior normal.    Assessment/Plan: The patient is scheduled for split-thickness skin graft to right chest wall wound, with Dr. Marla Roe.  Risks, benefits, and alternatives of procedure discussed, questions answered and consent obtained.    Smoking Status: Non-smoker; Counseling Given?  N/A  Caprini Score: 4; Risk Factors include: Age, and length of planned surgery. Recommendation for mechanical and pharmacological prophylaxis while hospitalized. Encourage early ambulation.   Pictures obtained: 05/26/2021  Post-op Rx sent to pharmacy: Zofran, doxycycline.  Patient is comfortable continuing his chronic fentanyl and Percocet for pain control after surgery.  Patient was provided with the General Surgical Risk consent document and Pain Medication Agreement prior to their appointment.  They had adequate time to read through the risk consent documents and Pain Medication Agreement. We also discussed them in person together during this preop appointment. All of  their questions were answered to their satisfaction.  Recommended calling if they have any further questions.  Risk consent form and Pain Medication Agreement to be scanned into patient's chart.  The  risks that can be encountered with and after a skin graft were discussed and include the following but not limited to these: bleeding, infection, delayed healing, anesthesia risks, skin sensation changes, injury to structures including nerves, blood vessels, and muscles which may be temporary or permanent, allergies to tape, suture materials and glues, blood products, topical preparations or injected agents, skin contour irregularities, skin discoloration and swelling, deep vein thrombosis, cardiac and pulmonary complications, pain, which may persist, failure of the graft and possible need for revisional surgery or staged procedures.  Patient is understanding of that with his diabetes and comorbidities that he is at an increased risk of postoperative complications and delayed healing.  We discussed the risks specific to a split-thickness skin graft which include need for additional procedures, failure of the graft to take, pain to the donor site.  Electronically signed by: Carola Rhine Doy Taaffe, PA-C 06/09/2021 12:31 PM

## 2021-06-19 ENCOUNTER — Other Ambulatory Visit: Payer: Self-pay

## 2021-06-19 ENCOUNTER — Encounter (HOSPITAL_COMMUNITY): Payer: Self-pay | Admitting: Plastic Surgery

## 2021-06-19 NOTE — Progress Notes (Signed)
PCP - Chipper Oman Zapata,MD Cardiologist - Gayatri A.Margaretann Loveless  PPM/ICD - denies Device Orders -  Rep Notified -   Chest x-ray -04/10/21  EKG - 05/08/21 Stress Test - none ECHO - 11/23/19 Cardiac Cath - no  Sleep Study - 11/21/19 CPAP - doesn't use  Fasting Blood Sugar - 100-130 Checks Blood Sugar _____ times a day Instructed him to check his blood sugar when he gets up Wednesday 7/6 AM. If blood sugar is 70 or below, treat with 1/2 cup of clear juice (apple or cranberry) and recheck blood sugar 15 minutes after drinking juice. If blood sugar is still less than 70,call [phone] for further instructions.   Blood Thinner Instructions:n/a Aspirin Instructions:n/a  ERAS Protcol -n/a PRE-SURGERY Ensure or G2-   COVID TEST- n/a ambulatory surgery   Anesthesia review: yes- cardiac history   All instructions explained to the patient, with a verbal understanding of the material. The opportunity to ask questions was provided.

## 2021-06-23 NOTE — Anesthesia Preprocedure Evaluation (Addendum)
Anesthesia Evaluation  Patient identified by MRN, date of birth, ID band Patient awake    Reviewed: Allergy & Precautions, NPO status , Patient's Chart, lab work & pertinent test results, reviewed documented beta blocker date and time   Airway Mallampati: I  TM Distance: >3 FB Neck ROM: Full    Dental  (+) Teeth Intact, Dental Advisory Given   Pulmonary neg pulmonary ROS,    Pulmonary exam normal breath sounds clear to auscultation       Cardiovascular hypertension, Pt. on home beta blockers and Pt. on medications + CAD  Normal cardiovascular exam Rhythm:Regular Rate:Normal  CT coronary morphology 11/23/19: 1. Coronary calcium score of 914. This was 53 percentile for age and sex matched control. 2. Normal coronary origin with right dominance. 3. Diffuse moderate CAD with severe stenosis in small lumen branches - ostial portion of acute marginal branch, distal portion of the LCX artery, OM2, and possibly in the mid to distal LAD. -Given diffuse disease in small branches, aggressive risk factor modification, anginal therapy and abstinence from drugs is recommended. CT FFR will be submitted to evaluate proximal and distal LAD lesions.  CT coronary FFR 11/23/19: 1. CT FFR analysis showed severe stenosis in the mid to distal LAD, ostial portion of OM2 and ostial portion of the acute marginal branch. These vessels are too small for intervention. Aggressive risk factor modification, anginal therapy and abstinence from drugs are recommended.  Echo 11/23/19: 1. Left ventricular ejection fraction, by visual estimation, is 50 to 55%. The left ventricle has normal function. There is no left ventricular hypertrophy.  2. Global right ventricle has normal systolic function.The right ventricular size is normal. No increase in right ventricular wall thickness.  3. Left atrial size was normal.  4. Right atrial size was mildly dilated.  5. The  mitral valve is normal in structure. No evidence of mitral valve regurgitation.  6. The tricuspid valve is normal in structure. Tricuspid valve regurgitation is not demonstrated.  7. The aortic valve is grossly normal. Aortic valve regurgitation is mild.  8. The pulmonic valve was normal in structure. Pulmonic valve regurgitation is not visualized.  9. The atrial septum is grossly normal.     Neuro/Psych  Headaches, PSYCHIATRIC DISORDERS Anxiety Depression    GI/Hepatic GERD  ,(+)     substance abuse (on fentanyl 65mcg/hr patch)  , Hepatitis -, C  Endo/Other  diabetes, Type 2  Renal/GU Renal InsufficiencyRenal disease  negative genitourinary   Musculoskeletal  (+) Arthritis , narcotic dependent  Abdominal   Peds  Hematology  (+) HIV,   Anesthesia Other Findings hx includes CAD (moderate CADon CTwith indeterminate FFR and no chest pain), HTN, tachycardia, DM, CKD (stage III), HIV, chronic hepatitis C (s/p treatment).  Hospitalized 3/25-4/22/22 for sepsis, R chest wall abscess and osteomyelitis in the setting of a fall & 6th/7th/8th rib fractures on 02/22/21. S/p debridement by CT surgery 03/19/21, I&D and wound vac 03/25/21, wound excision 05/14/21  Reproductive/Obstetrics                           Anesthesia Physical Anesthesia Plan  ASA: 3  Anesthesia Plan: General   Post-op Pain Management:    Induction: Intravenous  PONV Risk Score and Plan: 2 and Ondansetron, Dexamethasone and Midazolam  Airway Management Planned: LMA  Additional Equipment:   Intra-op Plan:   Post-operative Plan: Extubation in OR  Informed Consent: I have reviewed the patients History and Physical, chart, labs  and discussed the procedure including the risks, benefits and alternatives for the proposed anesthesia with the patient or authorized representative who has indicated his/her understanding and acceptance.     Dental advisory given  Plan Discussed with:  CRNA  Anesthesia Plan Comments:        Anesthesia Quick Evaluation

## 2021-06-23 NOTE — Progress Notes (Signed)
Anesthesia Chart Review: Same day workup  Pertinent hx includes CAD (moderate CAD on CT with indeterminate FFR and no chest pain), HTN, tachycardia, DM, CKD (stage III), HIV, chronic hepatitis C (s/p treatment).   Hospitalized 3/25-4/22/22 for sepsis, R chest wall abscess and osteomyelitis in the setting of a fall & 6th/7th/8th rib fractures on 02/22/21. S/p debridement by CT surgery 03/19/21, I&D and wound vac 03/25/21, wound excision 05/14/21.  Last seen by cardiologist Dr. Margaretann Loveless 05/08/21 and preop risk was discussed at that time. Per note, "Pre-operative cardiovascular examination -EKG normal today and patient is asymptomatic. The patient is intermediate risk for general anesthesia.  He recently had general anesthesia 2 times in the last 6 weeks with no complications afterward.  He is stable on current cardiovascular regimen of Zetia, Imdur, propranolol.  No further cardiovascular testing is required prior to the procedure.  If this level of risk is acceptable to the patient and surgical team, the patient should be considered optimized from a cardiovascular standpoint.  Patient understands risk stratification and is willing to proceed with surgical recommendations."  Will need DOS labs and evaluation.   EKG 05/08/21: NSR. Rate 82.  PORTABLE CHEST 1 VIEW 04/11/21: COMPARISON:  Chest radiograph dated 04/10/2021.   FINDINGS: The heart size and mediastinal contours are within normal limits. Both lungs are clear. Fixation hardware overlies the lower cervical spine.   IMPRESSION: No active disease.  CT chest 03/22/21:  1. No CT evidence of pulmonary embolism. 2. Small bilateral pleural effusions with partial compressive atelectasis of the lower lobes. Pneumonia is not excluded. Clinical correlation is recommended. 3. Large skin wound in the lower right anterior chest wall with a wound VAC. 4. Destructive changes of the anterior right sixth rib concerning for osteomyelitis. Clinical correlation is  recommended. No drainable fluid collection or abscess. 5. Aortic Atherosclerosis    CT coronary morphology 11/23/19:  1. Coronary calcium score of 914. This was 17 percentile for age and sex matched control. 2. Normal coronary origin with right dominance. 3. Diffuse moderate CAD with severe stenosis in small lumen branches - ostial portion of acute marginal branch, distal portion of the LCX artery, OM2, and possibly in the mid to distal LAD. - Given diffuse disease in small branches, aggressive risk factor modification, anginal therapy and abstinence from drugs is recommended. CT FFR will be submitted to evaluate proximal and distal LAD lesions.   CT coronary FFR 11/23/19:  1. CT FFR analysis showed severe stenosis in the mid to distal LAD, ostial portion of OM2 and ostial portion of the acute marginal branch. These vessels are too small for intervention. Aggressive risk factor modification, anginal therapy and abstinence from drugs are recommended.   Echo 11/23/19:  1. Left ventricular ejection fraction, by visual estimation, is 50 to 55%. The left ventricle has normal function. There is no left ventricular hypertrophy.  2. Global right ventricle has normal systolic function.The right ventricular size is normal. No increase in right ventricular wall thickness.  3. Left atrial size was normal.  4. Right atrial size was mildly dilated.  5. The mitral valve is normal in structure. No evidence of mitral valve regurgitation.  6. The tricuspid valve is normal in structure. Tricuspid valve regurgitation is not demonstrated.  7. The aortic valve is grossly normal. Aortic valve regurgitation is mild.  8. The pulmonic valve was normal in structure. Pulmonic valve regurgitation is not visualized.  9. The atrial septum is grossly normal.    Karoline Caldwell, PA-C [facility]  Stay Center/Anesthesiology Phone [phone] 06/23/2021 9:43 AM

## 2021-06-24 ENCOUNTER — Ambulatory Visit (HOSPITAL_COMMUNITY): Payer: Medicare Other | Admitting: Physician Assistant

## 2021-06-24 ENCOUNTER — Other Ambulatory Visit: Payer: Self-pay

## 2021-06-24 ENCOUNTER — Encounter (HOSPITAL_COMMUNITY): Payer: Self-pay | Admitting: Plastic Surgery

## 2021-06-24 ENCOUNTER — Encounter (HOSPITAL_COMMUNITY)
Admission: RE | Disposition: A | Discharge status: Home / Self Care | Payer: Self-pay | Source: Home / Self Care | Attending: Plastic Surgery

## 2021-06-24 ENCOUNTER — Ambulatory Visit (HOSPITAL_COMMUNITY)
Admission: RE | Admit: 2021-06-24 | Discharge: 2021-06-24 | Disposition: A | Discharge status: Home / Self Care | Payer: Medicare Other | Attending: Plastic Surgery | Admitting: Plastic Surgery

## 2021-06-24 DIAGNOSIS — E785 Hyperlipidemia, unspecified: Secondary | ICD-10-CM | POA: Diagnosis not present

## 2021-06-24 DIAGNOSIS — Z888 Allergy status to other drugs, medicaments and biological substances status: Secondary | ICD-10-CM | POA: Insufficient documentation

## 2021-06-24 DIAGNOSIS — Z85828 Personal history of other malignant neoplasm of skin: Secondary | ICD-10-CM | POA: Insufficient documentation

## 2021-06-24 DIAGNOSIS — I2510 Atherosclerotic heart disease of native coronary artery without angina pectoris: Secondary | ICD-10-CM | POA: Diagnosis not present

## 2021-06-24 DIAGNOSIS — N183 Chronic kidney disease, stage 3 unspecified: Secondary | ICD-10-CM | POA: Insufficient documentation

## 2021-06-24 DIAGNOSIS — E1122 Type 2 diabetes mellitus with diabetic chronic kidney disease: Secondary | ICD-10-CM | POA: Insufficient documentation

## 2021-06-24 DIAGNOSIS — K219 Gastro-esophageal reflux disease without esophagitis: Secondary | ICD-10-CM | POA: Diagnosis not present

## 2021-06-24 DIAGNOSIS — Z8616 Personal history of COVID-19: Secondary | ICD-10-CM | POA: Diagnosis not present

## 2021-06-24 DIAGNOSIS — I129 Hypertensive chronic kidney disease with stage 1 through stage 4 chronic kidney disease, or unspecified chronic kidney disease: Secondary | ICD-10-CM | POA: Insufficient documentation

## 2021-06-24 DIAGNOSIS — T8149XA Infection following a procedure, other surgical site, initial encounter: Secondary | ICD-10-CM | POA: Diagnosis present

## 2021-06-24 DIAGNOSIS — G4733 Obstructive sleep apnea (adult) (pediatric): Secondary | ICD-10-CM | POA: Diagnosis not present

## 2021-06-24 DIAGNOSIS — Z79899 Other long term (current) drug therapy: Secondary | ICD-10-CM | POA: Diagnosis not present

## 2021-06-24 DIAGNOSIS — Z91048 Other nonmedicinal substance allergy status: Secondary | ICD-10-CM | POA: Diagnosis not present

## 2021-06-24 DIAGNOSIS — X58XXXA Exposure to other specified factors, initial encounter: Secondary | ICD-10-CM | POA: Diagnosis not present

## 2021-06-24 DIAGNOSIS — S21101A Unspecified open wound of right front wall of thorax without penetration into thoracic cavity, initial encounter: Secondary | ICD-10-CM | POA: Diagnosis not present

## 2021-06-24 DIAGNOSIS — Z885 Allergy status to narcotic agent status: Secondary | ICD-10-CM | POA: Diagnosis not present

## 2021-06-24 DIAGNOSIS — Z21 Asymptomatic human immunodeficiency virus [HIV] infection status: Secondary | ICD-10-CM | POA: Diagnosis not present

## 2021-06-24 DIAGNOSIS — Z881 Allergy status to other antibiotic agents status: Secondary | ICD-10-CM | POA: Insufficient documentation

## 2021-06-24 HISTORY — PX: SKIN SPLIT GRAFT: SHX444

## 2021-06-24 LAB — COMPREHENSIVE METABOLIC PANEL
ALT: 14 U/L (ref 0–44)
AST: 24 U/L (ref 15–41)
Albumin: 3.8 g/dL (ref 3.5–5.0)
Alkaline Phosphatase: 80 U/L (ref 38–126)
Anion gap: 10 (ref 5–15)
BUN: 34 mg/dL — ABNORMAL HIGH (ref 8–23)
CO2: 25 mmol/L (ref 22–32)
Calcium: 9.4 mg/dL (ref 8.9–10.3)
Chloride: 103 mmol/L (ref 98–111)
Creatinine, Ser: 1.31 mg/dL — ABNORMAL HIGH (ref 0.61–1.24)
GFR, Estimated: 60 mL/min — ABNORMAL LOW (ref >60)
Glucose, Bld: 101 mg/dL — ABNORMAL HIGH (ref 70–99)
Potassium: 4.3 mmol/L (ref 3.5–5.1)
Sodium: 138 mmol/L (ref 135–145)
Total Bilirubin: 1 mg/dL (ref 0.3–1.2)
Total Protein: 6.8 g/dL (ref 6.5–8.1)

## 2021-06-24 LAB — CBC
HCT: 36.9 % — ABNORMAL LOW (ref 39.0–52.0)
Hemoglobin: 12.1 g/dL — ABNORMAL LOW (ref 13.0–17.0)
MCH: 32.2 pg (ref 26.0–34.0)
MCHC: 32.8 g/dL (ref 30.0–36.0)
MCV: 98.1 fL (ref 80.0–100.0)
Platelets: 165 10*3/uL (ref 150–400)
RBC: 3.76 MIL/uL — ABNORMAL LOW (ref 4.22–5.81)
RDW: 13 % (ref 11.5–15.5)
WBC: 4.7 10*3/uL (ref 4.0–10.5)
nRBC: 0 % (ref 0.0–0.2)

## 2021-06-24 LAB — GLUCOSE, CAPILLARY
Glucose-Capillary: 104 mg/dL — ABNORMAL HIGH (ref 70–99)
Glucose-Capillary: 93 mg/dL (ref 70–99)

## 2021-06-24 SURGERY — APPLICATION, GRAFT, SKIN, SPLIT-THICKNESS
Anesthesia: General | Site: Chest | Laterality: Right

## 2021-06-24 MED ORDER — CHLORHEXIDINE GLUCONATE 0.12 % MT SOLN: 15.0000 mL | Freq: Once | OROMUCOSAL | Status: AC

## 2021-06-24 MED ORDER — PHENYLEPHRINE HCL (PRESSORS) 10 MG/ML IV SOLN
INTRAVENOUS | Status: AC
  Filled 2021-06-24: qty 1

## 2021-06-24 MED ORDER — EPHEDRINE 5 MG/ML INJ
INTRAVENOUS | Status: AC
  Filled 2021-06-24: qty 10

## 2021-06-24 MED ORDER — PHENYLEPHRINE 40 MCG/ML (10ML) SYRINGE FOR IV PUSH (FOR BLOOD PRESSURE SUPPORT)
PREFILLED_SYRINGE | INTRAVENOUS | Status: DC | PRN
  Administered 2021-06-24: 80 ug via INTRAVENOUS

## 2021-06-24 MED ORDER — PHENYLEPHRINE 40 MCG/ML (10ML) SYRINGE FOR IV PUSH (FOR BLOOD PRESSURE SUPPORT)
PREFILLED_SYRINGE | INTRAVENOUS | Status: AC
  Filled 2021-06-24: qty 10

## 2021-06-24 MED ORDER — FENTANYL CITRATE (PF) 100 MCG/2ML IJ SOLN: 25.0000 ug | INTRAMUSCULAR | Status: DC | PRN

## 2021-06-24 MED ORDER — PROPOFOL 10 MG/ML IV BOLUS
INTRAVENOUS | Status: AC
  Filled 2021-06-24: qty 20

## 2021-06-24 MED ORDER — ACETAMINOPHEN 500 MG PO TABS
ORAL_TABLET | ORAL | Status: AC
  Administered 2021-06-24: 1000 mg via ORAL
  Filled 2021-06-24: qty 2

## 2021-06-24 MED ORDER — ORAL CARE MOUTH RINSE: 15.0000 mL | Freq: Once | OROMUCOSAL | Status: AC

## 2021-06-24 MED ORDER — CHLORHEXIDINE GLUCONATE CLOTH 2 % EX PADS: 6.0000 | MEDICATED_PAD | Freq: Once | CUTANEOUS | Status: DC

## 2021-06-24 MED ORDER — ACETAMINOPHEN 650 MG RE SUPP: 650.0000 mg | RECTAL | Status: DC | PRN

## 2021-06-24 MED ORDER — CHLORHEXIDINE GLUCONATE 0.12 % MT SOLN
OROMUCOSAL | Status: AC
  Administered 2021-06-24: 15 mL via OROMUCOSAL
  Filled 2021-06-24: qty 15

## 2021-06-24 MED ORDER — DEXAMETHASONE SODIUM PHOSPHATE 10 MG/ML IJ SOLN
INTRAMUSCULAR | Status: DC | PRN
  Administered 2021-06-24: 5 mg via INTRAVENOUS

## 2021-06-24 MED ORDER — KETAMINE HCL 50 MG/5ML IJ SOSY
PREFILLED_SYRINGE | INTRAMUSCULAR | Status: AC
  Filled 2021-06-24: qty 5

## 2021-06-24 MED ORDER — SODIUM CHLORIDE 0.9 % IV SOLN: 250.0000 mL | INTRAVENOUS | Status: DC | PRN

## 2021-06-24 MED ORDER — BUPIVACAINE-EPINEPHRINE 0.25% -1:200000 IJ SOLN
INTRAMUSCULAR | Status: DC | PRN
  Administered 2021-06-24: 9 mL

## 2021-06-24 MED ORDER — FENTANYL CITRATE (PF) 250 MCG/5ML IJ SOLN
INTRAMUSCULAR | Status: DC | PRN
  Administered 2021-06-24: 100 ug via INTRAVENOUS

## 2021-06-24 MED ORDER — ACETAMINOPHEN 500 MG PO TABS: 1000.0000 mg | ORAL_TABLET | Freq: Once | ORAL | Status: AC

## 2021-06-24 MED ORDER — FENTANYL CITRATE (PF) 250 MCG/5ML IJ SOLN
INTRAMUSCULAR | Status: AC
  Filled 2021-06-24: qty 5

## 2021-06-24 MED ORDER — SODIUM CHLORIDE 0.9% FLUSH: 3.0000 mL | Freq: Two times a day (BID) | INTRAVENOUS | Status: DC

## 2021-06-24 MED ORDER — CEFAZOLIN SODIUM-DEXTROSE 2-4 GM/100ML-% IV SOLN
INTRAVENOUS | Status: AC
  Filled 2021-06-24: qty 100

## 2021-06-24 MED ORDER — FENTANYL CITRATE (PF) 100 MCG/2ML IJ SOLN
25.0000 ug | INTRAMUSCULAR | Status: DC | PRN
  Administered 2021-06-24 (×2): 50 ug via INTRAVENOUS

## 2021-06-24 MED ORDER — 0.9 % SODIUM CHLORIDE (POUR BTL) OPTIME
TOPICAL | Status: DC | PRN
  Administered 2021-06-24: 1000 mL

## 2021-06-24 MED ORDER — MIDAZOLAM HCL 2 MG/2ML IJ SOLN
INTRAMUSCULAR | Status: DC | PRN
  Administered 2021-06-24: 2 mg via INTRAVENOUS

## 2021-06-24 MED ORDER — LACTATED RINGERS IV SOLN: INTRAVENOUS | Status: DC

## 2021-06-24 MED ORDER — MIDAZOLAM HCL 2 MG/2ML IJ SOLN
INTRAMUSCULAR | Status: AC
  Filled 2021-06-24: qty 2

## 2021-06-24 MED ORDER — CEFAZOLIN SODIUM-DEXTROSE 2-4 GM/100ML-% IV SOLN
2.0000 g | INTRAVENOUS | Status: AC
  Administered 2021-06-24: 2 g via INTRAVENOUS

## 2021-06-24 MED ORDER — PROPOFOL 10 MG/ML IV BOLUS
INTRAVENOUS | Status: DC | PRN
  Administered 2021-06-24: 150 mg via INTRAVENOUS
  Administered 2021-06-24: 50 mg via INTRAVENOUS

## 2021-06-24 MED ORDER — FENTANYL CITRATE (PF) 100 MCG/2ML IJ SOLN
INTRAMUSCULAR | Status: AC
  Filled 2021-06-24: qty 2

## 2021-06-24 MED ORDER — BUPIVACAINE-EPINEPHRINE (PF) 0.25% -1:200000 IJ SOLN
INTRAMUSCULAR | Status: AC
  Filled 2021-06-24: qty 30

## 2021-06-24 MED ORDER — SODIUM CHLORIDE 0.9% FLUSH: 3.0000 mL | INTRAVENOUS | Status: DC | PRN

## 2021-06-24 MED ORDER — ACETAMINOPHEN 325 MG PO TABS: 650.0000 mg | ORAL_TABLET | ORAL | Status: DC | PRN

## 2021-06-24 MED ORDER — LIDOCAINE 2% (20 MG/ML) 5 ML SYRINGE
INTRAMUSCULAR | Status: DC | PRN
  Administered 2021-06-24: 60 mg via INTRAVENOUS

## 2021-06-24 MED ORDER — ACETAMINOPHEN 10 MG/ML IV SOLN
INTRAVENOUS | Status: AC
  Filled 2021-06-24: qty 100

## 2021-06-24 MED ORDER — KETAMINE HCL 10 MG/ML IJ SOLN
INTRAMUSCULAR | Status: DC | PRN
  Administered 2021-06-24: 20 mg via INTRAVENOUS

## 2021-06-24 MED ORDER — EPHEDRINE SULFATE-NACL 50-0.9 MG/10ML-% IV SOSY
PREFILLED_SYRINGE | INTRAVENOUS | Status: DC | PRN
  Administered 2021-06-24: 5 mg via INTRAVENOUS

## 2021-06-24 MED ORDER — PHENYLEPHRINE HCL-NACL 10-0.9 MG/250ML-% IV SOLN
INTRAVENOUS | Status: DC | PRN
  Administered 2021-06-24: 40 ug/min via INTRAVENOUS

## 2021-06-24 MED ORDER — ONDANSETRON HCL 4 MG/2ML IJ SOLN
INTRAMUSCULAR | Status: DC | PRN
  Administered 2021-06-24: 4 mg via INTRAVENOUS

## 2021-06-24 SURGICAL SUPPLY — 52 items
BLADE CLIPPER SURG (BLADE) IMPLANT
BLADE DERMATOME SS (BLADE) ×2 IMPLANT
BNDG ELASTIC 4X5.8 VLCR STR LF (GAUZE/BANDAGES/DRESSINGS) IMPLANT
BNDG ELASTIC 6X5.8 VLCR STR LF (GAUZE/BANDAGES/DRESSINGS) IMPLANT
BNDG GAUZE ELAST 4 BULKY (GAUZE/BANDAGES/DRESSINGS) IMPLANT
CANISTER SUCT 3000ML PPV (MISCELLANEOUS) IMPLANT
CANISTER WOUND CARE 500ML ATS (WOUND CARE) IMPLANT
COTTONBALL LRG STERILE PKG (GAUZE/BANDAGES/DRESSINGS) IMPLANT
COVER SURGICAL LIGHT HANDLE (MISCELLANEOUS) ×2 IMPLANT
DERMACARRIERS GRAFT 1 TO 1.5 (DISPOSABLE) ×2
DRAPE HALF SHEET 40X57 (DRAPES) ×2 IMPLANT
DRAPE INCISE IOBAN 66X45 STRL (DRAPES) ×2 IMPLANT
DRAPE ORTHO SPLIT 77X108 STRL (DRAPES) ×2
DRAPE SURG ORHT 6 SPLT 77X108 (DRAPES) ×2 IMPLANT
DRESSING HYDROCOLLOID 4X4 (GAUZE/BANDAGES/DRESSINGS) IMPLANT
DRESSING MEPILEX FLEX 4X4 (GAUZE/BANDAGES/DRESSINGS) ×1 IMPLANT
DRSG CUTIMED SORBACT 7X9 (GAUZE/BANDAGES/DRESSINGS) ×2 IMPLANT
DRSG MEPILEX FLEX 4X4 (GAUZE/BANDAGES/DRESSINGS) ×2
DRSG OPSITE 6X11 MED (GAUZE/BANDAGES/DRESSINGS) IMPLANT
DRSG PAD ABDOMINAL 8X10 ST (GAUZE/BANDAGES/DRESSINGS) ×2 IMPLANT
DRSG TELFA 3X8 NADH (GAUZE/BANDAGES/DRESSINGS) ×4 IMPLANT
DRSG VAC ATS LRG SENSATRAC (GAUZE/BANDAGES/DRESSINGS) IMPLANT
DRSG VAC ATS MED SENSATRAC (GAUZE/BANDAGES/DRESSINGS) IMPLANT
DRSG VAC ATS SM SENSATRAC (GAUZE/BANDAGES/DRESSINGS) IMPLANT
ELECT REM PT RETURN 9FT ADLT (ELECTROSURGICAL)
ELECTRODE REM PT RTRN 9FT ADLT (ELECTROSURGICAL) IMPLANT
FILTER STRAW FLUID ASPIR (MISCELLANEOUS) ×2 IMPLANT
GAUZE SPONGE 4X4 12PLY STRL (GAUZE/BANDAGES/DRESSINGS) ×2 IMPLANT
GAUZE XEROFORM 5X9 LF (GAUZE/BANDAGES/DRESSINGS) ×2 IMPLANT
GEL ULTRASOUND 20GR AQUASONIC (MISCELLANEOUS) IMPLANT
GLOVE SURG ENC MOIS LTX SZ6.5 (GLOVE) ×4 IMPLANT
GOWN STRL REUS W/ TWL LRG LVL3 (GOWN DISPOSABLE) ×2 IMPLANT
GOWN STRL REUS W/TWL LRG LVL3 (GOWN DISPOSABLE) ×2
GRAFT DERMACARRIERS 1 TO 1.5 (DISPOSABLE) ×1 IMPLANT
GRAFT MYRIAD 3 LAYER 5X5 (Graft) ×2 IMPLANT
HANDPIECE INTERPULSE COAX TIP (DISPOSABLE)
KIT BASIN OR (CUSTOM PROCEDURE TRAY) ×2 IMPLANT
KIT TURNOVER KIT B (KITS) ×2 IMPLANT
NEEDLE HYPO 25GX1X1/2 BEV (NEEDLE) ×2 IMPLANT
NS IRRIG 1000ML POUR BTL (IV SOLUTION) ×2 IMPLANT
PACK GENERAL/GYN (CUSTOM PROCEDURE TRAY) ×2 IMPLANT
PAD ARMBOARD 7.5X6 YLW CONV (MISCELLANEOUS) ×4 IMPLANT
SET HNDPC FAN SPRY TIP SCT (DISPOSABLE) IMPLANT
STAPLER VISISTAT 35W (STAPLE) ×2 IMPLANT
SURGILUBE 2OZ TUBE FLIPTOP (MISCELLANEOUS) IMPLANT
SUT CHROMIC 4 0 PS 2 18 (SUTURE) IMPLANT
SUT SILK 4 0 PS 2 (SUTURE) IMPLANT
SUT VIC AB 5-0 PS2 18 (SUTURE) ×4 IMPLANT
SYR CONTROL 10ML LL (SYRINGE) ×2 IMPLANT
TOWEL GREEN STERILE (TOWEL DISPOSABLE) ×2 IMPLANT
TOWEL GREEN STERILE FF (TOWEL DISPOSABLE) ×2 IMPLANT
UNDERPAD 30X36 HEAVY ABSORB (UNDERPADS AND DIAPERS) ×2 IMPLANT

## 2021-06-24 NOTE — Discharge Instructions (Addendum)
INSTRUCTIONS FOR AFTER SURGERY   You will likely have some questions about what to expect following your operation.  The following information will help you and your family understand what to expect when you are discharged from the hospital.  Following these guidelines will help ensure a smooth recovery and reduce risks of complications.  Postoperative instructions include information on: diet, wound care, medications and physical activity.  AFTER SURGERY Expect to go home after the procedure.   DIET This surgery does not require a specific diet.  However, the healthier you eat the better your body can start healing. It is important to increasing your protein intake.  This means limiting the foods with added sugar.  Focus on fruits and vegetables and some meat. It is very important to drink water after your surgery.  If your urine is bright yellow, then it is concentrated, and you need to drink more water.  As a general rule after surgery, you should have 8 ounces of water every hour while awake.  If you find you are persistently nauseated or unable to take in liquids let us know.  NO TOBACCO USE or EXPOSURE.  This will slow your healing process and increase the risk of a wound.  WOUND CARE You have a wound VAC and a graft: Clean body with baby wipes until next appointment.  Do not remove VAC.  Do not get chest wet.  if the [facility] is disconnected you will risk loosing the graft.   The right leg wound:  This is your donor site where we got the skin for your chest.  Place KY gel on the area daily starting on Friday.  ACTIVITY No heavy lifting until cleared by the doctor.  It is OK to walk and climb stairs. In fact, moving your legs is very important to decrease your risk of a blood clot.  It will also help keep you from getting deconditioned.  Every 1 to 2 hours get up and walk for 5 minutes. This will help with a quicker recovery back to normal.  Let pain be your guide so you don't do too much.     WORK Everyone returns to work at different times. As a rough guide, most people take at least 1 - 2 weeks off prior to returning to work. If you need documentation for your job, bring the forms to your postoperative follow up visit.  DRIVING You may be able to drive a few days after surgery but not while taking any narcotics or valium.  BOWEL MOVEMENTS Constipation can occur after anesthesia and while taking pain medication.  It is important to stay ahead for your comfort.  We recommend taking Milk of Magnesia (2 tablespoons; twice a day) while taking the pain pills.  SEROMA This is fluid your body tried to put in the surgical site.  This is normal but if it creates excessive pain and swelling let us know.  It usually decreases in a few weeks.  WHEN TO CALL Call your surgeon's office if any of the following occur:  Fever 101 degrees F or greater  Excessive bleeding or fluid from the incision site.  Pain that increases over time without aid from the medications  Redness, warmth, or pus draining from incision sites  Persistent nausea or inability to take in liquids  Severe misshapen area that underwent the operation.

## 2021-06-24 NOTE — Interval H&P Note (Signed)
History and Physical Interval Note:  06/24/2021 1:38 PM  Brian Malone  has presented today for surgery, with the diagnosis of WOUND INFECTION AFTER SURGERY-CHEST WOUND.  The various methods of treatment have been discussed with the patient and family. After consideration of risks, benefits and other options for treatment, the patient has consented to  Procedure(s): SKIN GRAFT SPLIT THICKNESS TO RIGHT CHEST (N/A) as a surgical intervention.  The patient's history has been reviewed, patient examined, no change in status, stable for surgery.  I have reviewed the patient's chart and labs.  Questions were answered to the patient's satisfaction.     [name]

## 2021-06-24 NOTE — Transfer of Care (Signed)
Immediate Anesthesia Transfer of Care Note  Patient: Brian Malone  Procedure(s) Performed: SKIN GRAFT SPLIT THICKNESS TO RIGHT CHEST (Right: Chest)  Patient Location: PACU  Anesthesia Type:General  Level of Consciousness: drowsy and patient cooperative  Airway & Oxygen Therapy: Patient Spontanous Breathing and Patient connected to face mask oxygen  Post-op Assessment: Report given to RN, Post -op Vital signs reviewed and stable and Patient moving all extremities  Post vital signs: Reviewed and stable  Last Vitals:  Vitals Value Taken Time  BP    Temp 97.2   Pulse    Resp    SpO2      Last Pain:  Vitals:   06/24/21 1322  TempSrc:   PainSc: 0-No pain         Complications: No notable events documented.

## 2021-06-24 NOTE — Op Note (Signed)
DATE OF OPERATION: 06/24/2021  LOCATION: [name]  PREOPERATIVE DIAGNOSIS: right chest wound 2.5 x 6 cm  POSTOPERATIVE DIAGNOSIS: Same  PROCEDURE: Split thickness skin graft to chest wall 2.5 x 6 cm, VAC placement, Myriad placement to donor site 5 x 5 cm  SURGEON: Kahdijah Errickson Sanger Darrin Apodaca, DO  EBL: 1 cc  CONDITION: Stable  COMPLICATIONS: None  INDICATION: The patient, Brian Malone, is a 70 y.o. male born on [birth year], is here for treatment of right chest wound.   PROCEDURE DETAILS:  The patient was seen prior to surgery and marked.  The IV antibiotics were given. The patient was taken to the operating room and given a general anesthetic. A standard time out was performed and all information was confirmed by those in the room. SCDs were placed.   The chest was prepped and draped.  Local with epinephrine was injected in the subcuticular area of the right thigh for the donor site.  The area was marked to match the wound size.  The dermatome was set at 11/999 inch.  The skin was removed from the thigh.  The 5 x 5 cm myriad was applied to the 5 x 5 cm donor site and secured with the 5-0 Vicryl and the sorbact.  A sterile dressing was applied.  The chest wound was cleaned and prepared.  The skin was then placed on the 2.5 x 6 cm wound. It was secured with the 5-0 Vicryl.  Two slits were placed to allow drainage.  The sorbact was applied and the VAC.  There was an excellent seal.  The patient was allowed to wake up and taken to recovery room in stable condition at the end of the case. The family was notified at the end of the case.

## 2021-06-24 NOTE — Anesthesia Procedure Notes (Signed)
Procedure Name: LMA Insertion Date/Time: 06/24/2021 2:24 PM Performed by: Alain Marion, CRNA Pre-anesthesia Checklist: Patient identified, Emergency Drugs available, Suction available and Patient being monitored Patient Re-evaluated:Patient Re-evaluated prior to induction Oxygen Delivery Method: Circle System Utilized Preoxygenation: Pre-oxygenation with 100% oxygen Induction Type: IV induction Ventilation: Mask ventilation without difficulty LMA: LMA inserted LMA Size: 4.0 Number of attempts: 2 (LMA 5 x1; LMA 4 x1) Placement Confirmation: positive ETCO2 Tube secured with: Tape Dental Injury: Teeth and Oropharynx as per pre-operative assessment  Comments: Performed by Heide Scales, SRNA under direct supervision by Dr. Lanetta Inch and Janet Berlin, CRNA

## 2021-06-25 ENCOUNTER — Encounter (HOSPITAL_COMMUNITY): Payer: Self-pay | Admitting: Plastic Surgery

## 2021-06-25 NOTE — Anesthesia Postprocedure Evaluation (Signed)
Anesthesia Post Note  Patient: [name]  Procedure(s) Performed: SKIN GRAFT SPLIT THICKNESS TO RIGHT CHEST (Right: Chest)     Patient location during evaluation: PACU Anesthesia Type: General Level of consciousness: awake and alert Pain management: pain level controlled Vital Signs Assessment: post-procedure vital signs reviewed and stable Respiratory status: spontaneous breathing, nonlabored ventilation, respiratory function stable and patient connected to nasal cannula oxygen Cardiovascular status: blood pressure returned to baseline and stable Postop Assessment: no apparent nausea or vomiting Anesthetic complications: no   No notable events documented.  Last Vitals:  Vitals:   06/24/21 1535 06/24/21 1548  BP: 138/83 135/79  Pulse: (!) 55 67  Resp: 16 11  Temp:  (!) 36.2 C  SpO2: 100% 100%    Last Pain:  Vitals:   06/24/21 1548  TempSrc:   PainSc: 2                  [name] [name]

## 2021-06-30 ENCOUNTER — Other Ambulatory Visit: Payer: Self-pay

## 2021-06-30 ENCOUNTER — Ambulatory Visit (INDEPENDENT_AMBULATORY_CARE_PROVIDER_SITE_OTHER): Payer: Medicare Other | Admitting: Surgical

## 2021-06-30 DIAGNOSIS — T8149XA Infection following a procedure, other surgical site, initial encounter: Secondary | ICD-10-CM

## 2021-06-30 DIAGNOSIS — S21101A Unspecified open wound of right front wall of thorax without penetration into thoracic cavity, initial encounter: Secondary | ICD-10-CM

## 2021-06-30 NOTE — Progress Notes (Signed)
70 year old male here for follow-up after placement of wound matrix and split-thickness skin graft to right chest wall wound with Dr. Marla Roe on 06/24/2021.  Patient had wound VAC placed.  Patient is doing well today.  No complaints.  He reports he has been doing K-Y jelly to the right thigh split-thickness skin graft donor site.  He has a wound VAC on the chest wall skin graft.  On exam skin graft has good adherence, no necrosis or sloughing noted.  No surrounding erythema.  No cellulitic changes noted.  Right thigh skin graft donor site with wound matrix in place, appears to be healing well. Sorbact mesh in place.  No foul odors.  No cellulitic changes.  Recommend continue with K-Y jelly to right thigh skin graft donor site, cover this with 4 x 4 gauze and Mepilex border or Medipore tape. Recommend Xeroform to right chest wall wound every other day follow-up in 4 4 gauze and Mepilex border dressing or Medipore tape. Wound VAC was removed. Recommend following up in 2 weeks for reevaluation.  There is no sign of infection, seroma, hematoma.  All of his questions were answered to his content.

## 2021-07-03 ENCOUNTER — Ambulatory Visit: Payer: Self-pay | Admitting: Thoracic Surgery (Cardiothoracic Vascular Surgery)

## 2021-07-03 ENCOUNTER — Encounter: Payer: Medicare Other | Admitting: Surgical

## 2021-07-14 ENCOUNTER — Encounter: Payer: Self-pay | Admitting: Plastic Surgery

## 2021-07-14 ENCOUNTER — Other Ambulatory Visit: Payer: Self-pay

## 2021-07-14 ENCOUNTER — Ambulatory Visit (INDEPENDENT_AMBULATORY_CARE_PROVIDER_SITE_OTHER): Payer: Medicare Other | Admitting: Plastic Surgery

## 2021-07-14 DIAGNOSIS — I2510 Atherosclerotic heart disease of native coronary artery without angina pectoris: Secondary | ICD-10-CM

## 2021-07-14 DIAGNOSIS — T8149XA Infection following a procedure, other surgical site, initial encounter: Secondary | ICD-10-CM

## 2021-07-14 NOTE — Progress Notes (Signed)
   Subjective:    Patient ID: Brian Malone, male    DOB: 1951-05-01, 70 y.o.   MRN: [id number]  The patient is a 70 year old male here for follow-up on his right chest wound.  He underwent a skin graft.  I took the dressing off today and it looks really good with 100% take.  No sign of infection.  Donor site has healed as well.  No infection on either area.     Review of Systems  Constitutional: Negative.   HENT: Negative.    Eyes: Negative.   Respiratory: Negative.    Cardiovascular: Negative.   Gastrointestinal: Negative.   Genitourinary: Negative.       Objective:   Physical Exam Vitals and nursing note reviewed.  Constitutional:      Appearance: Normal appearance.  HENT:     Head: Normocephalic and atraumatic.  Cardiovascular:     Rate and Rhythm: Normal rate.     Pulses: Normal pulses.  Skin:    General: Skin is warm.     Capillary Refill: Capillary refill takes less than 2 seconds.     Coloration: Skin is not jaundiced.     Findings: No bruising.  Neurological:     Mental Status: He is alert. Mental status is at baseline.       Assessment & Plan:     ICD-10-CM   1. Wound infection after surgery  T81.49XA       The patient should do Vaseline dressing changes once a day.  This can be done to the donor site and the grafted area on the chest.  No sun exposure.  Keep it covered for another week.  Can shower. Pictures were obtained of the patient and placed in the chart with the patient's or guardian's permission.
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
## 2021-07-21 ENCOUNTER — Ambulatory Visit (INDEPENDENT_AMBULATORY_CARE_PROVIDER_SITE_OTHER): Payer: Medicare Other | Admitting: Neurology

## 2021-07-21 ENCOUNTER — Encounter: Payer: Self-pay | Admitting: Neurology

## 2021-07-21 ENCOUNTER — Other Ambulatory Visit: Payer: Self-pay

## 2021-07-21 VITALS — BP 118/70 | HR 81 | Ht 72.0 in | Wt 169.0 lb

## 2021-07-21 DIAGNOSIS — G5631 Lesion of radial nerve, right upper limb: Secondary | ICD-10-CM

## 2021-07-21 DIAGNOSIS — G43109 Migraine with aura, not intractable, without status migrainosus: Secondary | ICD-10-CM | POA: Diagnosis not present

## 2021-07-21 DIAGNOSIS — I2510 Atherosclerotic heart disease of native coronary artery without angina pectoris: Secondary | ICD-10-CM

## 2021-07-21 DIAGNOSIS — G629 Polyneuropathy, unspecified: Secondary | ICD-10-CM

## 2021-07-21 NOTE — Patient Instructions (Signed)
Use rizatriptan as needed.   If we need to restart Aimovig, contact me

## 2021-07-24 ENCOUNTER — Other Ambulatory Visit: Payer: Self-pay

## 2021-07-24 ENCOUNTER — Encounter: Payer: Self-pay | Admitting: Thoracic Surgery (Cardiothoracic Vascular Surgery)

## 2021-07-24 ENCOUNTER — Ambulatory Visit (INDEPENDENT_AMBULATORY_CARE_PROVIDER_SITE_OTHER): Payer: Medicare Other | Admitting: Thoracic Surgery (Cardiothoracic Vascular Surgery)

## 2021-07-24 VITALS — BP 125/73 | HR 88 | Resp 20 | Ht 72.0 in | Wt 168.0 lb

## 2021-07-24 DIAGNOSIS — L02213 Cutaneous abscess of chest wall: Secondary | ICD-10-CM

## 2021-07-24 NOTE — Progress Notes (Signed)
      [address]       [place] [address]             [phone]        Story K Hakim [place] Medical Record [id number] Date of Birth: [birth year]  Referring: Elgergawy, Silver Huguenin, MD Primary Care: [name], [name], MD Primary Cardiologist:Gayatri Stann Mainland, MD  Reason for visit:   follow-up  History of Present Illness:     70 year old male status post chest wall resection for severe infection presents in follow-up.  He recently underwent skin grafting by Dr.Dillingham.  Overall he is doing well.  He has no complaints today  Physical Exam: BP 125/73 (BP Location: Left Arm, Patient Position: Sitting)   Pulse 88   Resp 20   Ht 6' (1.829 m)   Wt 168 lb (76.2 kg)   SpO2 97% Comment: RA  BMI 22.78 kg/m   Alert NAD Incision well-healed..        Assessment / Plan:   70 year old male status post chest wall debridement.  He underwent skin grafting by plastic surgery.  He is currently doing well.  No need for further follow-up with CT surgery.   Lajuana Matte 07/24/2021 4:20 PM

## 2021-07-28 ENCOUNTER — Ambulatory Visit: Payer: Medicare Other | Admitting: Neurology

## 2021-09-29 ENCOUNTER — Other Ambulatory Visit: Payer: Self-pay | Admitting: Neurology

## 2021-10-07 ENCOUNTER — Telehealth: Payer: Self-pay

## 2021-10-07 NOTE — Telephone Encounter (Signed)
Refill request recevied from [facility] for Aimovi.  Per last ov note 07/27/21 pt and DR.Jaffe decied to hold on the [place] for now and to call if he wanted to restart.  Telephone call to pt, to see he wanted to refill the mediation  Per pt he do not want to restart, He's okay right now.

## 2021-10-16 ENCOUNTER — Ambulatory Visit (INDEPENDENT_AMBULATORY_CARE_PROVIDER_SITE_OTHER): Payer: Medicare Other | Admitting: Physician Assistant

## 2021-10-16 ENCOUNTER — Other Ambulatory Visit: Payer: Self-pay

## 2021-10-16 DIAGNOSIS — L989 Disorder of the skin and subcutaneous tissue, unspecified: Secondary | ICD-10-CM

## 2021-10-16 DIAGNOSIS — T8149XA Infection following a procedure, other surgical site, initial encounter: Secondary | ICD-10-CM | POA: Diagnosis not present

## 2021-10-16 DIAGNOSIS — I2510 Atherosclerotic heart disease of native coronary artery without angina pectoris: Secondary | ICD-10-CM

## 2021-10-16 NOTE — Progress Notes (Signed)
Referring Provider Brian Malone, [name], MD [place],  [place] [phone]   CC:  Chief Complaint  Patient presents with   Follow-up      Brian Malone is an 70 y.o. male.  HPI: Patient is a 70 year old male with PMH of right-sided chest wound subsequent to a large hematoma/abscess that is now s/p debridement and matriDerm placement 05/14/2021 followed by [facility] 06/24/2021 with Dr. Marla Roe who presents to clinic for follow-up.  He was last evaluated here in clinic on 07/14/2021.  At that time, graft harvest site had healed well and the right chest wound was also improving considerably.  Plan was for daily Vaseline dressing changes and avoidance of sun exposure.  Today, he is accompanied by his wife at bedside.  He is doing well.  He stopped applying Vaseline a couple of months ago because it epithelialized.  Denies any redness, swelling, or any new or worsening pain.  He does experience occasional sharp shooting discomfort that he suspects to be neuropathic.  He had similar symptoms postoperatively from a thoracotomy performed years ago.   Allergies  Allergen Reactions   Adhesive [Tape] Other (See Comments)    "Tape will take off my skin, as will Band-Aids"   Cyclobenzaprine Other (See Comments)    Hallucinations   Duloxetine Hcl Other (See Comments)    Makes PTSD- induced nightmares more vivid    Morphine Itching and Other (See Comments)    In IV form causes hallucinations, aggression, and makes patient altered also. Oral morphine is ok     Zyvox [Linezolid] Nausea And Vomiting    And headaches   Pregabalin Anxiety    Pt reports insomnia and worsening anxiety     Outpatient Encounter Medications as of 10/16/2021  Medication Sig Note   acetaminophen (TYLENOL) 325 MG tablet Take 650 mg by mouth every 6 (six) hours as needed for mild pain.    AIMOVIG 140 MG/ML SOAJ Inject 140 mg as directed every 28 (twenty-eight) days.    buprenorphine (BUTRANS) 15 MCG/HR  1 patch once a week.    busPIRone (BUSPAR) 30 MG tablet Take 30 mg by mouth 2 (two) times daily.    Carboxymethylcellul-Glycerin (LUBRICATING EYE DROPS OP) Place 1 drop into both eyes daily as needed (dry eyes).    clonazePAM (KLONOPIN) 0.5 MG tablet Take 0.5 mg by mouth in the morning, at noon, and at bedtime. 11/04/2020: PMP: Narcotic score 460; Sedative score 501; Overdose risk 570; LF 10/17/2020 # 90/30 DS @ [facility]   docusate sodium (COLACE) 250 MG capsule Take 250 mg by mouth in the morning and at bedtime.    Ensure Max Protein (ENSURE MAX PROTEIN) LIQD Take 11 oz by mouth in the morning, at noon, and at bedtime.    ezetimibe (ZETIA) 10 MG tablet Take 10 mg by mouth daily.    fentaNYL (DURAGESIC) 25 MCG/HR Place 1 patch onto the skin every 3 (three) days. (Patient not taking: Reported on 07/21/2021)    isosorbide mononitrate (IMDUR) 60 MG 24 hr tablet Take 60 mg by mouth every evening.    loratadine (CLARITIN) 10 MG tablet Take 10 mg by mouth every evening.    ondansetron (ZOFRAN-ODT) 8 MG disintegrating tablet Take 8 mg by mouth every 8 (eight) hours as needed for nausea or vomiting.    oxyCODONE-acetaminophen (PERCOCET) 10-325 MG tablet Take 1 tablet by mouth 2 (two) times daily as needed for pain.    pantoprazole (PROTONIX) 40 MG tablet Take  40 mg by mouth daily before breakfast.     polyethylene glycol (MIRALAX / GLYCOLAX) 17 g packet Take 8.5 g by mouth daily.    propranolol ER (INDERAL LA) 60 MG 24 hr capsule Take 60 mg by mouth every evening.    Semaglutide,0.25 or 0.5MG /DOS, 2 MG/1.5ML SOPN Inject 0.5 mg into the skin once a week. 06/24/2021: Ozempic   tamsulosin (FLOMAX) 0.4 MG CAPS capsule Take 0.4 mg by mouth at bedtime.     tiZANidine (ZANAFLEX) 4 MG tablet Take 2-4 mg by mouth at bedtime.    TRIUMEQ 600-50-300 MG tablet Take 1 tablet by mouth daily.    No facility-administered encounter medications on file as of 10/16/2021.     Past Medical History:  Diagnosis Date   Acute  subdural hematoma ([place]) 10/28/2019   Anxiety    Arthritis    Cancer (HCC)    basal cell on nose   Cellulitis of right upper extremity 11/04/2020   Depression    Diabetes mellitus without complication (HCC)    Dysphagia    GERD (gastroesophageal reflux disease)    Hepatitis C    High cholesterol    History of COVID-19 05/26/2020   History of kidney stones    HIV (human immunodeficiency virus infection) ([place])    Hypertension    Pneumonia    Renal disorder    Tachycardia     Past Surgical History:  Procedure Laterality Date   ANKLE ARTHROSCOPY     APPENDECTOMY     APPLICATION OF WOUND VAC Right 03/25/2021   Procedure: APPLICATION OF WOUND VAC;  Surgeon: Lajuana Matte, MD;  Location: [facility];  Service: Thoracic;  Laterality: Right;   BACK SURGERY     FUSION   DEBRIDEMENT AND CLOSURE WOUND Right 05/14/2021   Procedure: Right chest wound excision;  Surgeon: Wallace Going, DO;  Location: [facility];  Service: Plastics;  Laterality: Right;   INCISION AND DRAINAGE Right 03/25/2021   Procedure: INCISION AND DRAINAGE OF CHEST WALL;  Surgeon: Lajuana Matte, MD;  Location: [facility];  Service: Thoracic;  Laterality: Right;   INCISION AND DRAINAGE ABSCESS Right 03/19/2021   Procedure: INCISION AND DRAINAGE RIGHT CHEST ABSCESS;  Surgeon: Lajuana Matte, MD;  Location: [facility];  Service: Vascular;  Laterality: Right;   IR US GUIDE BX ASP/DRAIN  03/14/2021   IR US GUIDE BX ASP/DRAIN  03/14/2021   PENILE PROSTHESIS  REMOVAL  05/2020   Removal due to abscess   PENILE PROSTHESIS PLACEMENT  04/2020   SKIN SPLIT GRAFT Right 05/14/2021   Procedure: possible skin graft and Matriderm;  Surgeon: Wallace Going, DO;  Location: [facility];  Service: Plastics;  Laterality: Right;   SKIN SPLIT GRAFT Right 06/24/2021   Procedure: SKIN GRAFT SPLIT THICKNESS TO RIGHT CHEST;  Surgeon: Wallace Going, DO;  Location: [facility];  Service: Plastics;  Laterality: Right;   THORACOTOMY Right 2008  ?    TONSILLECTOMY      Family History  Problem Relation Age of Onset   CAD Mother    Lung cancer Mother    CAD Father    Lung cancer Father    CAD Brother     Social History   Social History Narrative   Pt is divorced   2 children   Right handed   Drinks soda, no coffee, no tea    Lives on the third floor of an apartment bldg. One story studio apartment     Review  of Systems General: Denies fevers or chills Skin: Denies any changes or redness at surgical sites. Endorses new lesion immediately lateral to right eye.  Physical Exam Vitals with BMI 07/24/2021 07/21/2021 06/24/2021  Height 6\' 0"  6\' 0"  -  Weight 168 lbs 169 lbs -  BMI 09.81 19.14 -  Systolic 782 956 213  Diastolic 73 70 79  Pulse 88 81 67  Some encounter information is confidential and restricted. Go to Review Flowsheets activity to see all data.    General:  No acute distress, nontoxic appearing  Respiratory: No increased work of breathing Neuro: Alert and oriented Psychiatric: Normal mood and affect  Skin: See photos. Good wound healing at surgical sites.  Small less than one cm lesion immediately lateral to right eye.  No cellulitic changes.  Assessment/Plan  Physical exam is entirely reassuring.  Scars noted.  No wounds or cellulitic changes.  Discussed scar management.  He would like to try Mederma. Given that this is a new scar, recommend that they apply Mederma once daily at night x8 weeks.  Patient then mentioned that he has this new lesion immediately lateral to right eye that is new.  He states that he first noticed it 5 to 6 months ago.  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  I told him that it should absolutely be biopsied.  I will send a message to Dr. Marla Roe to see if we would be able to accommodate that request or if he should follow-up with dermatology for biopsy.  From a wound care standpoint, he is cleared and has no specific  follow-up needed.  Krista Blue 10/16/2021, 11:17 AM

## 2021-10-19 NOTE — Addendum Note (Signed)
Addended by: [place] Romp on: 10/19/2021 08:18 AM   Modules accepted: Orders

## 2021-11-09 ENCOUNTER — Ambulatory Visit (INDEPENDENT_AMBULATORY_CARE_PROVIDER_SITE_OTHER): Payer: Medicare Other | Admitting: Plastic Surgery

## 2021-11-09 ENCOUNTER — Other Ambulatory Visit: Payer: Self-pay

## 2021-11-09 VITALS — BP 147/77 | HR 84 | Ht 72.0 in | Wt 179.8 lb

## 2021-11-09 DIAGNOSIS — D489 Neoplasm of uncertain behavior, unspecified: Secondary | ICD-10-CM

## 2021-11-10 ENCOUNTER — Other Ambulatory Visit (HOSPITAL_COMMUNITY)
Admission: RE | Admit: 2021-11-10 | Discharge: 2021-11-10 | Disposition: A | Discharge status: Home / Self Care | Payer: Medicare Other | Source: Ambulatory Visit | Attending: Plastic Surgery | Admitting: Plastic Surgery

## 2021-11-10 ENCOUNTER — Ambulatory Visit (INDEPENDENT_AMBULATORY_CARE_PROVIDER_SITE_OTHER): Payer: Medicare Other | Admitting: Plastic Surgery

## 2021-11-10 ENCOUNTER — Other Ambulatory Visit: Payer: Self-pay

## 2021-11-10 VITALS — BP 143/74 | HR 88

## 2021-11-10 DIAGNOSIS — L989 Disorder of the skin and subcutaneous tissue, unspecified: Secondary | ICD-10-CM | POA: Diagnosis present

## 2021-11-10 DIAGNOSIS — L814 Other melanin hyperpigmentation: Secondary | ICD-10-CM | POA: Diagnosis not present

## 2021-11-10 NOTE — Progress Notes (Signed)
Operative Note   DATE OF OPERATION: 11/10/2021  LOCATION:    SURGICAL DEPARTMENT: Plastic Surgery  PREOPERATIVE DIAGNOSES:  right face pigmented lesion  POSTOPERATIVE DIAGNOSES:  same  PROCEDURE:  Excision of 1 cm right facial lesion with simple closure   SURGEON: Sawyer Kahan P. Trelyn Vanderlinde, MD  ANESTHESIA:  Local  COMPLICATIONS: None.   INDICATIONS FOR PROCEDURE:  The patient, Brian Malone is a 70 y.o. male born on Aug 28, 1951, is here for treatment of right facial lesion lateral to right eye. MRN: [id number]  CONSENT:  Informed consent was obtained directly from the patient. Risks, benefits and alternatives were fully discussed. Specific risks including but not limited to bleeding, infection, hematoma, seroma, scarring, pain, infection, wound healing problems, and need for further surgery were all discussed. The patient did have an ample opportunity to have questions answered to satisfaction.   DESCRIPTION OF PROCEDURE:  Local anesthesia was administered. The patient's operative site was prepped and draped in a sterile fashion. A time out was performed and all information was confirmed to be correct.  The lesion was excised with a 15 blade.  Hemostasis was obtained.  Circumferential undermining was performed and the skin was advanced and closed in with Prolene in a single layer in the orientation of his crow's feet.  The specimen was sent to pathology.  The patient tolerated the procedure well.  There were no complications.

## 2021-11-11 NOTE — Progress Notes (Signed)
Referring Provider [name], [name], MD [address],  [place] [address]   CC:  Lesion lateral to right eye    Brian Malone is an 70 y.o. male.  HPI: 70 year old concerned of lesion lateral to the right eye.  Irregular pigmented.  Allergies  Allergen Reactions   Adhesive [Tape] Other (See Comments)    "Tape will take off my skin, as will Band-Aids"   Cyclobenzaprine Other (See Comments)    Hallucinations   Duloxetine Hcl Other (See Comments)    Makes PTSD- induced nightmares more vivid    Morphine Itching and Other (See Comments)    In IV form causes hallucinations, aggression, and makes patient altered also. Oral morphine is ok     Zyvox [Linezolid] Nausea And Vomiting    And headaches   Pregabalin Anxiety    Pt reports insomnia and worsening anxiety     Outpatient Encounter Medications as of 11/09/2021  Medication Sig Note   acetaminophen (TYLENOL) 325 MG tablet Take 650 mg by mouth every 6 (six) hours as needed for mild pain.    AIMOVIG 140 MG/ML SOAJ Inject 140 mg as directed every 28 (twenty-eight) days.    buprenorphine (BUTRANS) 20 MCG/HR PTWK 1 patch once a week.    busPIRone (BUSPAR) 30 MG tablet Take 30 mg by mouth 2 (two) times daily.    Carboxymethylcellul-Glycerin (LUBRICATING EYE DROPS OP) Place 1 drop into both eyes daily as needed (dry eyes).    clonazePAM (KLONOPIN) 0.5 MG tablet Take 0.5 mg by mouth in the morning, at noon, and at bedtime. 11/04/2020: PMP: Narcotic score 460; Sedative score 501; Overdose risk 570; LF 10/17/2020 # 90/30 DS @ [facility]   docusate sodium (COLACE) 250 MG capsule Take 250 mg by mouth in the morning and at bedtime.    DOVATO 50-300 MG tablet Take 1 tablet by mouth daily.    Ensure Max Protein (ENSURE MAX PROTEIN) LIQD Take 11 oz by mouth in the morning, at noon, and at bedtime.    ezetimibe (ZETIA) 10 MG tablet Take 10 mg by mouth daily.    isosorbide mononitrate (IMDUR) 60 MG 24 hr tablet Take 60 mg  by mouth every evening.    loratadine (CLARITIN) 10 MG tablet Take 10 mg by mouth every evening.    ondansetron (ZOFRAN-ODT) 8 MG disintegrating tablet Take 8 mg by mouth every 8 (eight) hours as needed for nausea or vomiting.    oxyCODONE-acetaminophen (PERCOCET) 10-325 MG tablet Take 1 tablet by mouth 2 (two) times daily as needed for pain.    pantoprazole (PROTONIX) 40 MG tablet Take 40 mg by mouth daily before breakfast.     polyethylene glycol (MIRALAX / GLYCOLAX) 17 g packet Take 8.5 g by mouth daily.    propranolol ER (INDERAL LA) 60 MG 24 hr capsule Take 60 mg by mouth every evening.    Semaglutide,0.25 or 0.5MG /DOS, 2 MG/1.5ML SOPN Inject 0.5 mg into the skin once a week. 06/24/2021: Ozempic   tamsulosin (FLOMAX) 0.4 MG CAPS capsule Take 0.4 mg by mouth at bedtime.     tiZANidine (ZANAFLEX) 4 MG tablet Take 2-4 mg by mouth at bedtime.    TRIUMEQ 600-50-300 MG tablet Take 1 tablet by mouth daily.    No facility-administered encounter medications on file as of 11/09/2021.     Past Medical History:  Diagnosis Date   Acute subdural hematoma ([facility]) 10/28/2019   Anxiety    Arthritis  Cancer ([facility])    basal cell on nose   Cellulitis of right upper extremity 11/04/2020   Depression    Diabetes mellitus without complication (HCC)    Dysphagia    GERD (gastroesophageal reflux disease)    Hepatitis C    High cholesterol    History of COVID-19 05/26/2020   History of kidney stones    HIV (human immunodeficiency virus infection) ([facility])    Hypertension    Pneumonia    Renal disorder    Tachycardia     Past Surgical History:  Procedure Laterality Date   ANKLE ARTHROSCOPY     APPENDECTOMY     APPLICATION OF WOUND VAC Right 03/25/2021   Procedure: APPLICATION OF WOUND VAC;  Surgeon: Lajuana Matte, MD;  Location: [facility] [place];  Service: Thoracic;  Laterality: Right;   BACK SURGERY     FUSION   DEBRIDEMENT AND CLOSURE WOUND Right 05/14/2021   Procedure: Right chest wound excision;   Surgeon: Wallace Going, DO;  Location: [facility];  Service: Plastics;  Laterality: Right;   INCISION AND DRAINAGE Right 03/25/2021   Procedure: INCISION AND DRAINAGE OF CHEST WALL;  Surgeon: Lajuana Matte, MD;  Location: [facility];  Service: Thoracic;  Laterality: Right;   INCISION AND DRAINAGE ABSCESS Right 03/19/2021   Procedure: INCISION AND DRAINAGE RIGHT CHEST ABSCESS;  Surgeon: Lajuana Matte, MD;  Location: [facility];  Service: Vascular;  Laterality: Right;   IR US GUIDE BX ASP/DRAIN  03/14/2021   IR US GUIDE BX ASP/DRAIN  03/14/2021   PENILE PROSTHESIS  REMOVAL  05/2020   Removal due to abscess   PENILE PROSTHESIS PLACEMENT  04/2020   SKIN SPLIT GRAFT Right 05/14/2021   Procedure: possible skin graft and Matriderm;  Surgeon: Wallace Going, DO;  Location: [facility];  Service: Plastics;  Laterality: Right;   SKIN SPLIT GRAFT Right 06/24/2021   Procedure: SKIN GRAFT SPLIT THICKNESS TO RIGHT CHEST;  Surgeon: Wallace Going, DO;  Location: [facility];  Service: Plastics;  Laterality: Right;   THORACOTOMY Right 2008  ?   TONSILLECTOMY      Family History  Problem Relation Age of Onset   CAD Mother    Lung cancer Mother    CAD Father    Lung cancer Father    CAD Brother     Social History   Social History Narrative   Pt is divorced   2 children   Right handed   Drinks soda, no coffee, no tea    Lives on the third floor of an apartment bldg. One story studio apartment     Review of Systems General: Denies fevers, chills, weight loss CV: Denies chest pain, shortness of breath, palpitations   Physical Exam Vitals with BMI 11/10/2021 11/09/2021 07/24/2021  Height - 6\' 0"  6\' 0"   Weight - 179 lbs 13 oz 168 lbs  BMI - 45.80 99.83  Systolic 382 505 397  Diastolic 74 77 73  Pulse 88 84 88  Some encounter information is confidential and restricted. Go to Review Flowsheets activity to see all data.    General:  No acute distress,  Alert and oriented, Non-Toxic, Normal  speech and affect Heent:  7 mm irregular pigmented lesion lateral to right eye  Assessment/Plan Given patient concerns about how fast this is growing excision of pigmented lesion lateral to right eye indicated.  Time based coding: 16 minutes were spent with the patient.  Greater than 50% was spent on counseling cordination  of care.  We discussed the nature of irregular pigmented lesions and that excision was reasonable.   Lennice Sites 11/11/2021, 6:53 PM

## 2021-11-16 ENCOUNTER — Other Ambulatory Visit: Payer: Self-pay

## 2021-11-16 ENCOUNTER — Ambulatory Visit (INDEPENDENT_AMBULATORY_CARE_PROVIDER_SITE_OTHER): Payer: Medicare Other | Admitting: Plastic Surgery

## 2021-11-16 ENCOUNTER — Encounter: Payer: Self-pay | Admitting: Plastic Surgery

## 2021-11-16 DIAGNOSIS — D489 Neoplasm of uncertain behavior, unspecified: Secondary | ICD-10-CM

## 2021-11-16 NOTE — Progress Notes (Signed)
Patient is status post excision lateral to the right eyelid on 11/10/2021.  Patient is doing well without complaints.  Physical exam Incisions clean dry and intact  Pathology: Pending  Assessment and plan: We will see the patient back in a couple weeks to discuss his pathology.  Sutures were removed today but pathology is still pending.

## 2021-11-18 LAB — SURGICAL PATHOLOGY

## 2021-11-20 ENCOUNTER — Encounter: Payer: Self-pay | Admitting: Plastic Surgery

## 2021-11-27 ENCOUNTER — Telehealth: Payer: Self-pay

## 2021-11-27 NOTE — Telephone Encounter (Signed)
New message  [name] (Key: [id number]) Aimovig 70MG /ML auto-injectors   Form [employer] Electronic PA Form Created 6 days ago Sent to Plan 1 minute ago Plan Response less than a minute ago Submit Clinical Questions Determination Message from [facility] already on file for this request. Authorization starting on 12/20/2020 and ending on 12/19/2022.  [employer] is unable to respond with clinical questions. Please see more information at the bottom of the page for next steps.

## 2021-12-03 ENCOUNTER — Ambulatory Visit: Payer: Medicare Other | Admitting: Plastic Surgery

## 2022-01-25 ENCOUNTER — Other Ambulatory Visit: Payer: Self-pay | Admitting: Internal Medicine
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
## 2022-01-27 ENCOUNTER — Encounter: Payer: Self-pay | Admitting: Neurology

## 2022-01-27 ENCOUNTER — Other Ambulatory Visit: Payer: Self-pay

## 2022-01-27 ENCOUNTER — Ambulatory Visit (INDEPENDENT_AMBULATORY_CARE_PROVIDER_SITE_OTHER): Payer: Medicare Other | Admitting: Neurology

## 2022-01-27 VITALS — BP 120/80 | HR 98 | Ht 68.0 in | Wt 170.0 lb

## 2022-01-27 DIAGNOSIS — G43109 Migraine with aura, not intractable, without status migrainosus: Secondary | ICD-10-CM | POA: Diagnosis not present

## 2022-02-01 ENCOUNTER — Encounter: Payer: Self-pay | Admitting: Internal Medicine

## 2022-03-24 ENCOUNTER — Ambulatory Visit: Payer: Medicare Other

## 2022-03-24 ENCOUNTER — Ambulatory Visit (INDEPENDENT_AMBULATORY_CARE_PROVIDER_SITE_OTHER): Payer: Medicare Other | Admitting: Podiatry

## 2022-03-24 DIAGNOSIS — M7752 Other enthesopathy of left foot: Secondary | ICD-10-CM | POA: Diagnosis not present

## 2022-03-24 DIAGNOSIS — E1143 Type 2 diabetes mellitus with diabetic autonomic (poly)neuropathy: Secondary | ICD-10-CM

## 2022-03-24 DIAGNOSIS — M7751 Other enthesopathy of right foot: Secondary | ICD-10-CM

## 2022-03-24 DIAGNOSIS — R52 Pain, unspecified: Secondary | ICD-10-CM

## 2022-03-24 MED ORDER — BETAMETHASONE SOD PHOS & ACET 6 (3-3) MG/ML IJ SUSP
3.0000 mg | Freq: Once | INTRAMUSCULAR | Status: AC
  Administered 2022-03-24: 3 mg via INTRA_ARTICULAR

## 2022-03-24 NOTE — Progress Notes (Signed)
SITUATION ?Reason for Consult: Evaluation for Prefabricated Diabetic Shoes and Custom Diabetic Inserts. ?Patient / Caregiver Report: Patient would like well fitting shoes ? ?OBJECTIVE DATA: ?Patient History / Diagnosis:  ?  ICD-10-CM   ?1. Capsulitis of left ankle  M77.52   ?  ?2. Diabetic autonomic neuropathy associated with type 2 diabetes mellitus (HCC)  E11.43   ?  ?3. Capsulitis of right ankle  M77.51   ?  ?4. New complaint of pain  R52   ?  ? ? ?Physician Treating Diabetes:  Dr. Denton Lank ([facility]) ? ?Current or Previous Devices:   Historical user ? ?In-Person Foot Examination: ?Ulcers & Callousing:   None ?Deformities:    hammertoes ?Sensation:    Compromised  ?Shoe Size:     11.5XW ? ?ORTHOTIC RECOMMENDATION ?Recommended Devices: ?- 1x pair prefabricated PDAC approved diabetic shoes; Patient Selected [employer] [place] [address] Blue Size 11.5XW ?- 3x pair custom-to-patient PDAC approved vacuum formed diabetic insoles. ? ?GOALS OF SHOES AND INSOLES ?- Reduce shear and pressure ?- Reduce / Prevent callus formation ?- Reduce / Prevent ulceration ?- Protect the fragile healing compromised diabetic foot. ? ?Patient would benefit from diabetic shoes and inserts as patient has diabetes mellitus and the patient has one or more of the following conditions: ?- History of partial or complete amputation of the foot ?- History of previous foot ulceration. ?- History of pre-ulcerative callus ?- Peripheral neuropathy with evidence of callus formation ?- Foot deformity ?- Poor circulation ? ?ACTIONS PERFORMED ?Potential out of pocket cost was communicated to patient. Patient understood and consented to measurement and casting. Patient was casted for insoles via crush box and measured for shoes via brannock device. Procedure was explained and patient tolerated procedure well. All questions were answered and concerns addressed. Casts were shipped to central fabrication for HOLD until Certificate of Medical Necessity or otherwise necessary  authorization from insurance is obtained. ? ?PLAN ?Shoes are to be ordered and casts released from hold once all appropriate paperwork is complete. Patient is to be contacted and scheduled for fitting once shoes and insoles have been fabricated and received. ? ?

## 2022-03-24 NOTE — Progress Notes (Signed)
? ?  Subjective:  ?71 y.o. male presenting today for new complaint of pain regarding bilateral ankle pain as well as midfoot pain.  Patient was last seen in the office 07/09/2019.  He says that the injections he received several years ago helped significantly.  The pain is slowly returned after being good for about 1 year.  Most recently his heart about a month.  He presents for further treatment and evaluation ? ? ?Past Medical History:  ?Diagnosis Date  ? Acute subdural hematoma 10/28/2019  ? Anxiety   ? Arthritis   ? Cancer [facility])   ? 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 Cellulitis of right upper extremity 11/04/2020  ? Depression   ? Diabetes mellitus without complication ([facility])   ? Dysphagia   ? GERD (gastroesophageal reflux disease)   ? Hepatitis C   ? High cholesterol   ? History of COVID-19 05/26/2020  ? History of kidney stones   ? HIV (human immunodeficiency virus infection) ([facility])   ? Hypertension   ? Pneumonia   ? Renal disorder   ? Tachycardia   ? ? ? ?Objective / Physical Exam:  ?General:  ?The patient is alert and oriented x3 in no acute distress. ?Dermatology:  ?Skin is warm, dry and supple bilateral lower extremities. Negative for open lesions or macerations. ?Vascular:  ?Palpable pedal pulses bilaterally. No edema or erythema noted. Capillary refill within normal limits. ?Neurological:  ?Epicritic and protective threshold grossly intact bilaterally.  ?Musculoskeletal Exam:  ?Pain on palpation to the anterior lateral medial aspects of the patient's bilateral ankles. Mild edema noted. Range of motion within normal limits to all pedal and ankle joints bilateral. Muscle strength 5/5 in all groups bilateral.  Patient also has high arches which likely contribute to the patient's ankle pain and midfoot pain ? ? ?Assessment: ?1.  Capsulitis bilateral ankles ? ?Plan of Care:  ?1. Patient was evaluated. X-Rays reviewed.  ?2. Injection of 0.5 mL Celestone Soluspan injected in the patient's bilateral ankle joints.  ?3.  Appointment  with Pedorthist for new custom molded orthotics ?4.  Return to clinic as needed ? ? ?Edrick Kins, DPM ?[place] [facility] ? ?Dr. Edrick Kins, DPM  ?  ?[place]                                        ?[place], [place] [address]                ?Office [phone]  ?Fax [phone] ? ? ? ?

## 2022-04-30 ENCOUNTER — Telehealth: Payer: Self-pay

## 2022-04-30 NOTE — Telephone Encounter (Signed)
CMN submitted ?

## 2022-07-15 ENCOUNTER — Telehealth: Payer: Self-pay | Admitting: Podiatry

## 2022-07-15 NOTE — Telephone Encounter (Signed)
Pt called asking about status of diabetic shoes and upon checking in safestep the provider had not signed off on the documents. Pt has appt coming up and I am mailing the documents to the patient to take to the appt with the pcp.
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
## 2022-08-09 ENCOUNTER — Encounter: Payer: Self-pay | Admitting: Neurology

## 2022-08-09 ENCOUNTER — Ambulatory Visit (INDEPENDENT_AMBULATORY_CARE_PROVIDER_SITE_OTHER): Payer: Medicare Other | Admitting: Neurology

## 2022-08-09 VITALS — BP 118/70 | HR 81 | Resp 18 | Ht 72.0 in | Wt 185.0 lb

## 2022-08-09 DIAGNOSIS — G43109 Migraine with aura, not intractable, without status migrainosus: Secondary | ICD-10-CM | POA: Diagnosis not present

## 2022-08-26 ENCOUNTER — Telehealth: Payer: Self-pay | Admitting: Podiatry

## 2022-08-26 NOTE — Telephone Encounter (Signed)
Patient wants [place] to re-mail the paper work to him , so that he can hand deliver the paper work to his doctors office. He called them again and they said they do not have it, after he dropped it off to them?  Please advise

## 2022-08-26 NOTE — Telephone Encounter (Signed)
Pt stated hes been waiting to hear back from the ortho department regarding his orthotics. He stated we faxed it over and he has also hand delivered the referral to his Dr and wants to know if we received it back yet. I told pt I sent a message to Orthotics and would send a note to Dr Amalia Hailey however the pt hung up.

## 2022-08-27 NOTE — Telephone Encounter (Signed)
[name] will be mailing the paperwork to the patient on Monday

## 2022-09-14 ENCOUNTER — Ambulatory Visit: Payer: Medicare Other | Admitting: Internal Medicine

## 2022-09-22 ENCOUNTER — Encounter: Payer: Self-pay | Admitting: Podiatry

## 2022-09-30 NOTE — Progress Notes (Signed)
Office Visit    Patient Name: Brian Malone Date of Encounter: 10/01/2022  Primary Care Provider:  [name], [name], MD Primary Cardiologist:  Elouise Munroe, MD  Chief Complaint    71 year old male with a history of CAD, hypertension, hyperlipidemia, type 2 diabetes, HIV, anxiety, depression, PTSD, and GERD who presents for follow-up related to CAD.  Past Medical History    Past Medical History:  Diagnosis Date   Acute subdural hematoma ([place]) 10/28/2019   Anxiety    Arthritis    Cancer (HCC)    basal cell on nose   Cellulitis of right upper extremity 11/04/2020   Depression    Diabetes mellitus without complication (HCC)    Dysphagia    GERD (gastroesophageal reflux disease)    Hepatitis C    High cholesterol    History of COVID-19 05/26/2020   History of kidney stones    HIV (human immunodeficiency virus infection) ([place])    Hypertension    Pneumonia    Renal disorder    Tachycardia    Past Surgical History:  Procedure Laterality Date   ANKLE ARTHROSCOPY     APPENDECTOMY     APPLICATION OF WOUND VAC Right 03/25/2021   Procedure: APPLICATION OF WOUND VAC;  Surgeon: Lajuana Matte, MD;  Location: [facility];  Service: Thoracic;  Laterality: Right;   BACK SURGERY     FUSION   DEBRIDEMENT AND CLOSURE WOUND Right 05/14/2021   Procedure: Right chest wound excision;  Surgeon: Wallace Going, DO;  Location: [facility];  Service: Plastics;  Laterality: Right;   INCISION AND DRAINAGE Right 03/25/2021   Procedure: INCISION AND DRAINAGE OF CHEST WALL;  Surgeon: Lajuana Matte, MD;  Location: [facility];  Service: Thoracic;  Laterality: Right;   INCISION AND DRAINAGE ABSCESS Right 03/19/2021   Procedure: INCISION AND DRAINAGE RIGHT CHEST ABSCESS;  Surgeon: Lajuana Matte, MD;  Location: [facility];  Service: Vascular;  Laterality: Right;   IR US GUIDE BX ASP/DRAIN  03/14/2021   IR US GUIDE BX ASP/DRAIN  03/14/2021   PENILE PROSTHESIS  REMOVAL  05/2020   Removal due to  abscess   PENILE PROSTHESIS PLACEMENT  04/2020   SKIN SPLIT GRAFT Right 05/14/2021   Procedure: possible skin graft and Matriderm;  Surgeon: Wallace Going, DO;  Location: [facility];  Service: Plastics;  Laterality: Right;   SKIN SPLIT GRAFT Right 06/24/2021   Procedure: SKIN GRAFT SPLIT THICKNESS TO RIGHT CHEST;  Surgeon: Wallace Going, DO;  Location: [facility];  Service: Plastics;  Laterality: Right;   THORACOTOMY Right 2008  ?   TONSILLECTOMY      Allergies  Allergies  Allergen Reactions   Adhesive [Tape] Other (See Comments)    "Tape will take off my skin, as will Band-Aids"   Cyclobenzaprine Other (See Comments)    Hallucinations   Duloxetine Hcl Other (See Comments)    Makes PTSD- induced nightmares more vivid    Morphine Itching and Other (See Comments)    In IV form causes hallucinations, aggression, and makes patient altered also. Oral morphine is ok     Zyvox [Linezolid] Nausea And Vomiting    And headaches   Pregabalin Anxiety    Pt reports insomnia and worsening anxiety     History of Present Illness    71 year old male with the above past medical history including CAD, hypertension, hyperlipidemia, type 2 diabetes, HIV, anxiety, depression, PTSD, and GERD.  Echocardiogram in 11/2019 showed EF 50  to 55%, normal LV function, no LVH, normal RV systolic function, mild aortic valve regurgitation.  Coronary CTA in 11/2019 showed severe stenosis of the mid to distal LAD, ostial portion of OM 2, and ostial portion of acute marginal branch, too small for intervention, indeterminate FFR.  Medical therapy was recommended.  He was hospitalized in April 2022 following a traumatic fall from which he sustained multiple rib fractures, hematomas, and ultimately underwent surgical reconstruction of chest wall.  He was last seen in the office on 05/08/2021 and was stable from a cardiac standpoint.  He denied symptoms concerning for angina.  BP was well controlled.  He was cleared for  surgery at the time without need for further cardiovascular testing.  He presents today for follow-up.  Since his last visit he has been stable from a cardiac standpoint.  He notes that he has occasional fleeting chest pressure that occurs at night when he awakens from a bad dream.  He is running 5K every day.  He denies any exertional symptoms concerning for angina.  He recently was taken off of his anxiety/PTSD medications and did experience some palpitations with withdrawal symptoms.  He has since resumed his medication.  He denies any recurrent palpitations.  He denies any dyspnea, edema, PND, orthopnea, weight gain.  He states that his PCP encouraged him to follow-up with cardiology to discuss the "severity" of his coronary artery disease.  He states that he is pending possible surgical procedure for penile prosthesis implant in the next coming months.  Overall, he reports feeling well and denies any additional concerns today.  Home Medications    Current Outpatient Medications  Medication Sig Dispense Refill   acetaminophen (TYLENOL) 325 MG tablet Take 650 mg by mouth every 6 (six) hours as needed for mild pain.     buprenorphine (BUTRANS) 20 MCG/HR PTWK 1 patch once a week.     busPIRone (BUSPAR) 30 MG tablet Take 30 mg by mouth 2 (two) times daily.     Carboxymethylcellul-Glycerin (LUBRICATING EYE DROPS OP) Place 1 drop into both eyes daily as needed (dry eyes).     clonazePAM (KLONOPIN) 0.5 MG tablet Take 0.5 mg by mouth in the morning, at noon, and at bedtime.     docusate sodium (COLACE) 250 MG capsule Take 250 mg by mouth in the morning and at bedtime.     Ensure Max Protein (ENSURE MAX PROTEIN) LIQD Take 11 oz by mouth in the morning, at noon, and at bedtime.     ezetimibe (ZETIA) 10 MG tablet Take 10 mg by mouth daily.     isosorbide mononitrate (IMDUR) 60 MG 24 hr tablet TAKE 1 TABLET(60 MG) BY MOUTH DAILY 90 tablet 1   loratadine (CLARITIN) 10 MG tablet Take 10 mg by mouth every  evening.     ondansetron (ZOFRAN-ODT) 8 MG disintegrating tablet Take 8 mg by mouth every 8 (eight) hours as needed for nausea or vomiting.     pantoprazole (PROTONIX) 40 MG tablet Take 40 mg by mouth daily before breakfast.      polyethylene glycol (MIRALAX / GLYCOLAX) 17 g packet Take 8.5 g by mouth daily.     propranolol ER (INDERAL LA) 60 MG 24 hr capsule Take 60 mg by mouth every evening.     Semaglutide,0.25 or 0.'5MG'$ /DOS, 2 MG/1.5ML SOPN Inject 0.5 mg into the skin once a week.     tamsulosin (FLOMAX) 0.4 MG CAPS capsule Take 0.4 mg by mouth at bedtime.  tiZANidine (ZANAFLEX) 4 MG tablet Take 2-4 mg by mouth at bedtime.     rosuvastatin (CRESTOR) 10 MG tablet Take 10 mg by mouth at bedtime.     No current facility-administered medications for this visit.     Review of Systems    He denies chest pain, palpitations, dyspnea, pnd, orthopnea, n, v, dizziness, syncope, edema, weight gain, or early satiety. All other systems reviewed and are otherwise negative except as noted above.   Physical Exam    VS:  BP 118/72 (BP Location: Left Arm, Patient Position: Sitting, Cuff Size: Normal)   Pulse 68   Ht 6' (1.829 m)   Wt 180 lb 9.6 oz (81.9 kg)   SpO2 97%   BMI 24.49 kg/m  GEN: Well nourished, well developed, in no acute distress. HEENT: normal. Neck: Supple, no JVD, carotid bruits, or masses. Cardiac: RRR, no murmurs, rubs, or gallops. No clubbing, cyanosis, edema.  Radials/DP/PT 2+ and equal bilaterally.  Respiratory:  Respirations regular and unlabored, clear to auscultation bilaterally. GI: Soft, nontender, nondistended, BS + x 4. MS: no deformity or atrophy. Skin: warm and dry, no rash. Neuro:  Strength and sensation are intact. Psych: Normal affect.  Accessory Clinical Findings    ECG personally reviewed by me today -NSR, 68 bpm- no acute changes.   Lab Results  Component Value Date   WBC 4.7 06/24/2021   HGB 12.1 (L) 06/24/2021   HCT 36.9 (L) 06/24/2021   MCV  98.1 06/24/2021   PLT 165 06/24/2021   Lab Results  Component Value Date   CREATININE 1.31 (H) 06/24/2021   BUN 34 (H) 06/24/2021   NA 138 06/24/2021   K 4.3 06/24/2021   CL 103 06/24/2021   CO2 25 06/24/2021   Lab Results  Component Value Date   ALT 14 06/24/2021   AST 24 06/24/2021   ALKPHOS 80 06/24/2021   BILITOT 1.0 06/24/2021   Lab Results  Component Value Date   CHOL 149 11/22/2019   HDL 25 (L) 11/22/2019   LDLCALC 47 11/22/2019   TRIG 133 02/12/2020   CHOLHDL 6.0 11/22/2019    Lab Results  Component Value Date   HGBA1C 8.3 (H) 03/14/2021    Assessment & Plan    1. CAD: Coronary CTA in 11/2019 showed severe stenosis of the mid to distal LAD, ostial portion of OM 2, and ostial portion of acute marginal branch, too small for intervention, indeterminate FFR.  Medical therapy was recommended.  He notes occasional chest pressure upon waking from a bad dream.  This occurs only upon waking at night following a bad dream in the setting of anxiety and stress.  He runs a 5K daily and denies any exertional symptoms concerning for angina.  He states his PCP advised him to discuss possible cardiac catheterization with his cardiologist.  Given prior CT findings and lack of symptoms, cardiac catheterization is not warranted at this point in time.  Patient states he has historically had "atypical presentations" for any acute illness and is concerned that something bad is going happen to him without him having any warning symptoms.  He is asking "how will I know if something is wrong?"  Patient increasingly anxious and frustrated when discussing his CAD and recommended treatment strategy. Discussed possibility of ETT/Lexiscan Myoview/cardiac PET stress test.  Patient declines stress test at this time.  I have scheduled an appointment for him with Dr. Margaretann Loveless in approximately 1 months to discuss his concerns regarding his prognosis and treatment plan.  Discussed ED precautions.  Pt verbalized  understanding.  Not on aspirin due to reported intolerance.  Continue isosorbide, Crestor, Zetia.   2. Hypertension: BP well controlled. Continue current antihypertensive regimen.   3. Hyperlipidemia: LDL was 38 in 08/2022. Monitored and managed per PCP. Continue Crestor, Zetia.  4. Type 2 diabetes: A1c was 5.9 in 01/2022. Monitored and managed per PCP.  5. Preoperative cardiac exam: Patient is pending possible penile prosthesis implant surgery.  According to the Revised Cardiac Risk Index (RCRI), his Perioperative Risk of Major Cardiac Event is (%): 0.4. His Functional Capacity in METs is: 8.33 according to the [employer] Activity Status Index (DASI). Therefore, based on ACC/AHA guidelines, patient would be at acceptable risk for the planned procedure without further cardiovascular testing.   5. Disposition: Follow-up in 1 month (MD only).      Lenna Sciara, NP 10/01/2022, 12:20 PM

## 2022-10-01 ENCOUNTER — Encounter: Payer: Self-pay | Admitting: Nurse Practitioner

## 2022-10-01 ENCOUNTER — Ambulatory Visit (INDEPENDENT_AMBULATORY_CARE_PROVIDER_SITE_OTHER): Payer: Medicare Other | Admitting: Podiatry

## 2022-10-01 ENCOUNTER — Ambulatory Visit: Payer: Medicare Other | Attending: Internal Medicine | Admitting: Nurse Practitioner

## 2022-10-01 VITALS — BP 118/72 | HR 68 | Ht 72.0 in | Wt 180.6 lb

## 2022-10-01 DIAGNOSIS — M7751 Other enthesopathy of right foot: Secondary | ICD-10-CM

## 2022-10-01 DIAGNOSIS — I2510 Atherosclerotic heart disease of native coronary artery without angina pectoris: Secondary | ICD-10-CM

## 2022-10-01 DIAGNOSIS — M7752 Other enthesopathy of left foot: Secondary | ICD-10-CM

## 2022-10-01 DIAGNOSIS — E1165 Type 2 diabetes mellitus with hyperglycemia: Secondary | ICD-10-CM | POA: Insufficient documentation

## 2022-10-01 DIAGNOSIS — Z01810 Encounter for preprocedural cardiovascular examination: Secondary | ICD-10-CM | POA: Diagnosis present

## 2022-10-01 DIAGNOSIS — E785 Hyperlipidemia, unspecified: Secondary | ICD-10-CM

## 2022-10-01 DIAGNOSIS — I10 Essential (primary) hypertension: Secondary | ICD-10-CM

## 2022-10-01 DIAGNOSIS — E1143 Type 2 diabetes mellitus with diabetic autonomic (poly)neuropathy: Secondary | ICD-10-CM

## 2022-10-01 NOTE — Patient Instructions (Signed)
Medication Instructions:  Your physician recommends that you continue on your current medications as directed. Please refer to the Current Medication list given to you today.   *If you need a refill on your cardiac medications before your next appointment, please call your pharmacy*   Lab Work: NONE ordered at this time of appointment   If you have labs (blood work) drawn today and your tests are completely normal, you will receive your results only by: [place] (if you have MyChart) OR A paper copy in the mail If you have any lab test that is abnormal or we need to change your treatment, we will call you to review the results.   Testing/Procedures: NONE ordered at this time of appointment     Follow-Up: At [facility], you and your health needs are our priority.  As part of our continuing mission to provide you with exceptional heart care, we have created designated Provider Care Teams.  These Care Teams include your primary Cardiologist (physician) and Advanced Practice Providers (APPs -  Physician Assistants and Nurse Practitioners) who all work together to provide you with the care you need, when you need it.  We recommend signing up for the patient portal called "MyChart".  Sign up information is provided on this After Visit Summary.  MyChart is used to connect with patients for Virtual Visits (Telemedicine).  Patients are able to view lab/test results, encounter notes, upcoming appointments, etc.  Non-urgent messages can be sent to your provider as well.   To learn more about what you can do with MyChart, go to [email or web address].    Your next appointment:    Keep Upcoming appointment   The format for your next appointment:   In Person  Provider:   Elouise Munroe, MD     Other Instructions   Important Information About Sugar      Your physician recommends that you continue on your current medications as directed. Please refer to the Current  Medication list given to you today.

## 2022-10-04 ENCOUNTER — Other Ambulatory Visit: Payer: Medicare Other

## 2022-10-19 ENCOUNTER — Telehealth: Payer: Self-pay | Admitting: Internal Medicine

## 2022-10-19 MED ORDER — ISOSORBIDE MONONITRATE ER 60 MG PO TB24: ORAL_TABLET | ORAL | 3 refills | Status: DC

## 2022-10-19 NOTE — Telephone Encounter (Signed)
*  STAT* If patient is at the pharmacy, call can be transferred to refill team.   1. Which medications need to be refilled? (please list name of each medication and dose if known)  isosorbide mononitrate (IMDUR) 60 MG 24 hr tablet  2. Which pharmacy/location (including street and city if local pharmacy) is medication to be sent to? [place]  3. Do they need a 30 day or 90 day supply? 30 day supply   Pharmacy states patient is out of medication.

## 2022-10-22 ENCOUNTER — Encounter: Payer: Self-pay | Admitting: Internal Medicine

## 2022-10-22 ENCOUNTER — Ambulatory Visit: Payer: Medicare Other | Attending: Internal Medicine | Admitting: Internal Medicine

## 2022-10-22 VITALS — BP 132/78 | HR 60 | Ht 72.0 in | Wt 179.6 lb

## 2022-10-22 DIAGNOSIS — I200 Unstable angina: Secondary | ICD-10-CM | POA: Insufficient documentation

## 2022-10-22 DIAGNOSIS — E785 Hyperlipidemia, unspecified: Secondary | ICD-10-CM | POA: Insufficient documentation

## 2022-10-22 DIAGNOSIS — B20 Human immunodeficiency virus [HIV] disease: Secondary | ICD-10-CM | POA: Diagnosis present

## 2022-10-22 DIAGNOSIS — I10 Essential (primary) hypertension: Secondary | ICD-10-CM | POA: Insufficient documentation

## 2022-10-22 DIAGNOSIS — I25118 Atherosclerotic heart disease of native coronary artery with other forms of angina pectoris: Secondary | ICD-10-CM | POA: Insufficient documentation

## 2022-10-22 NOTE — Patient Instructions (Signed)
Medication Instructions:  No Changes In Medications at this time.  *If you need a refill on your cardiac medications before your next appointment, please call your pharmacy*  Lab Work: BMET AND CBC TODAY  If you have labs (blood work) drawn today and your tests are completely normal, you will receive your results only by: [place] (if you have MyChart) OR A paper copy in the mail If you have any lab test that is abnormal or we need to change your treatment, we will call you to review the results.  Testing/Procedures: Your physician has requested that you have a cardiac catheterization. Cardiac catheterization is used to diagnose and/or treat various heart conditions. Doctors may recommend this procedure for a number of different reasons. The most common reason is to evaluate chest pain. Chest pain can be a symptom of coronary artery disease (CAD), and cardiac catheterization can show whether plaque is narrowing or blocking your heart's arteries. This procedure is also used to evaluate the valves, as well as measure the blood flow and oxygen levels in different parts of your heart. For further information please visit HugeFiesta.tn. Please follow instruction sheet, as given.  Follow-Up: At [facility], you and your health needs are our priority.  As part of our continuing mission to provide you with exceptional heart care, we have created designated Provider Care Teams.  These Care Teams include your primary Cardiologist (physician) and Advanced Practice Providers (APPs -  Physician Assistants and Nurse Practitioners) who all work together to provide you with the care you need, when you need it.  Your next appointment:   1-2 WEEKS POST CATH   The format for your next appointment:   In Person  Provider:   HAO MENG PA-C [place] PA-C  Other Instructions  [place] A DEPT OF [place] A  DEPT OF [place]. [facility] [place] [id number] [place] [address] Dept: [place]: [place]  10/22/2022  You are scheduled for a Cardiac Catheterization on Tuesday, November 7 with Dr. Glenetta Hew.  1. Please arrive at the [facility] (Main Entrance A) at [facility]: [address] [place], [place] [address] at 8:30 AM (This time is two hours before your procedure to ensure your preparation). Free valet parking service is available.   Special note: Every effort is made to have your procedure done on time. Please understand that emergencies sometimes delay scheduled procedures.  2. Diet: Do not eat solid foods after midnight.  The patient may have clear liquids until 5am upon the day of the procedure.  3. Labs: You will need to have blood drawn on TODAY  4. Medication instructions in preparation for your procedure:   Contrast Allergy: No  On the morning of your procedure, take your Aspirin 81 mg and any morning medicines NOT listed above.  You may use sips of water.  5. Plan for one night stay--bring personal belongings. 6. Bring a current list of your medications and current insurance cards. 7. You MUST have a responsible person to drive you home. 8. Someone MUST be with you the first 24 hours after you arrive home or your discharge will be delayed. 9. Please wear clothes that are easy to get on and off and wear slip-on shoes.  Thank you for allowing [place] to care for you!   -- [place] Invasive Cardiovascular services

## 2022-10-22 NOTE — H&P (View-Only) (Signed)
Cardiology Office Note:    Date:  10/22/2022   ID:  Lev, Cervone [birth year], MRN [id number]  PCP:  [name], [name], MD  Cardiologist:  Elouise Munroe, MD  Electrophysiologist:  None   Referring MD: [name], [name], MD   Chief Complaint: CAD, hypotension and tachycardia, Pre-Op optimization needed  History of Present Illness:    Brian Malone is a 71 y.o. male with a history of diabetes, hypertension, HIV, hyperlipidemia, chronic kidney disease stage III, hepatitis C treated.  CT coronary angiography demonstrated moderate CAD with indeterminate FFR values.   Last visit, he presenting for a follow up after a hospitalization after a fall for rib fractures and hematoma. He came to clinic for risk stratification for debridement surgeries. At the time, his HIV was well controlled.   Today:  He "is in the best health he's been in years."  He does not have chest pain, but he does have chest discomfort. This is most commonly felt when he has a PTSD episode, but he feels them as well with strenuous exercise. This does not stop him from running, and he often does 5ks. He feels his symptoms are not lifestyle limiting.  He is now taking a statin with stable LFTs this is permissible. His LDL is 53. His triglycerides are 105. His liver function is stable. It had not been previously which lead to delay in initiation of statin.   He subjectively reports inflammatory joint disease which is exacerbated by Asprin. He has had his joints aspirates several times, but no crystals have been found. Unclear what this reaction is but he has informed me in the past he categorically cannot take lifelong aspirin. This, and his prior trauma leading to a subdural hematoma have lead to many conversations about challenges pursuing cath for progressive symptoms, and we have pursued aggressive medical therapy as tolerated. We participated in shared decision making today. The patient notes now,  despite optimal medical therapy, his chest pain "scares him" and he would like to proceed with cath.   He denies any palpitations, shortness of breath, or peripheral edema. No lightheadedness, headaches, syncope, orthopnea, or PND.   Past Medical History:  Diagnosis Date   Acute subdural hematoma ([place]) 10/28/2019   Anxiety    Arthritis    Cancer (HCC)    basal cell on nose   Cellulitis of right upper extremity 11/04/2020   Depression    Diabetes mellitus without complication (HCC)    Dysphagia    GERD (gastroesophageal reflux disease)    Hepatitis C    High cholesterol    History of COVID-19 05/26/2020   History of kidney stones    HIV (human immunodeficiency virus infection) ([place])    Hypertension    Pneumonia    Renal disorder    Tachycardia     Past Surgical History:  Procedure Laterality Date   ANKLE ARTHROSCOPY     APPENDECTOMY     APPLICATION OF WOUND VAC Right 03/25/2021   Procedure: APPLICATION OF WOUND VAC;  Surgeon: Lajuana Matte, MD;  Location: [place];  Service: Thoracic;  Laterality: Right;   BACK SURGERY     FUSION   DEBRIDEMENT AND CLOSURE WOUND Right 05/14/2021   Procedure: Right chest wound excision;  Surgeon: Wallace Going, DO;  Location: [place];  Service: Plastics;  Laterality: Right;   INCISION AND DRAINAGE Right 03/25/2021   Procedure: INCISION AND DRAINAGE OF CHEST WALL;  Surgeon: Lajuana Matte, MD;  Location:  [place] [facility];  Service: Thoracic;  Laterality: Right;   INCISION AND DRAINAGE ABSCESS Right 03/19/2021   Procedure: INCISION AND DRAINAGE RIGHT CHEST ABSCESS;  Surgeon: Lajuana Matte, MD;  Location: [place];  Service: Vascular;  Laterality: Right;   IR US GUIDE BX ASP/DRAIN  03/14/2021   IR US GUIDE BX ASP/DRAIN  03/14/2021   PENILE PROSTHESIS  REMOVAL  05/2020   Removal due to abscess   PENILE PROSTHESIS PLACEMENT  04/2020   SKIN SPLIT GRAFT Right 05/14/2021   Procedure: possible skin graft and Matriderm;  Surgeon: Wallace Going,  DO;  Location: [place];  Service: Plastics;  Laterality: Right;   SKIN SPLIT GRAFT Right 06/24/2021   Procedure: SKIN GRAFT SPLIT THICKNESS TO RIGHT CHEST;  Surgeon: Wallace Going, DO;  Location: [place];  Service: Plastics;  Laterality: Right;   THORACOTOMY Right 2008  ?   TONSILLECTOMY      Current Medications: Current Meds  Medication Sig   acetaminophen (TYLENOL) 325 MG tablet Take 650 mg by mouth every 6 (six) hours as needed for mild pain.   buprenorphine (BUTRANS) 20 MCG/HR PTWK 1 patch once a week.   busPIRone (BUSPAR) 30 MG tablet Take 30 mg by mouth 2 (two) times daily.   Carboxymethylcellul-Glycerin (LUBRICATING EYE DROPS OP) Place 1 drop into both eyes daily as needed (dry eyes).   clonazePAM (KLONOPIN) 0.5 MG tablet Take 0.5 mg by mouth in the morning, at noon, and at bedtime.   docusate sodium (COLACE) 250 MG capsule Take 250 mg by mouth in the morning and at bedtime.   Ensure Max Protein (ENSURE MAX PROTEIN) LIQD Take 11 oz by mouth in the morning, at noon, and at bedtime.   ezetimibe (ZETIA) 10 MG tablet Take 10 mg by mouth daily.   isosorbide mononitrate (IMDUR) 60 MG 24 hr tablet TAKE 1 TABLET(60 MG) BY MOUTH DAILY   loratadine (CLARITIN) 10 MG tablet Take 10 mg by mouth every evening.   ondansetron (ZOFRAN-ODT) 8 MG disintegrating tablet Take 8 mg by mouth every 8 (eight) hours as needed for nausea or vomiting.   pantoprazole (PROTONIX) 40 MG tablet Take 40 mg by mouth daily before breakfast.    polyethylene glycol (MIRALAX / GLYCOLAX) 17 g packet Take 8.5 g by mouth daily.   propranolol ER (INDERAL LA) 60 MG 24 hr capsule Take 60 mg by mouth every evening.   rosuvastatin (CRESTOR) 10 MG tablet Take 10 mg by mouth at bedtime.   Semaglutide,0.25 or 0.'5MG'$ /DOS, 2 MG/1.5ML SOPN Inject 0.5 mg into the skin once a week.   tamsulosin (FLOMAX) 0.4 MG CAPS capsule Take 0.4 mg by mouth at bedtime.    tiZANidine (ZANAFLEX) 4 MG tablet Take 2-4 mg by mouth at bedtime.      Allergies:   Adhesive [tape], Cyclobenzaprine, Duloxetine hcl, Morphine, Zyvox [linezolid], and Pregabalin   Social History   Socioeconomic History   Marital status: Divorced    Spouse name: Not on file   Number of children: 2   Years of education: Not on file   Highest education level: Bachelor's degree (e.g., BA, AB, BS)  Occupational History   Not on file  Tobacco Use   Smoking status: Never   Smokeless tobacco: Never  Vaping Use   Vaping Use: Never used  Substance and Sexual Activity   Alcohol use: Not Currently   Drug use: Never   Sexual activity: Not on file  Other Topics Concern   Not on file  Social History Narrative   Pt is divorced   2 children   Right handed   Drinks soda, no coffee, no tea    Lives on the third floor of an apartment bldg. One story studio apartment   Social Determinants of Health   Financial Resource Strain: Not on file  Food Insecurity: Not on file  Transportation Needs: Not on file  Physical Activity: Not on file  Stress: Not on file  Social Connections: Not on file     Family History: The patient's family history includes CAD in his brother, father, and mother; Lung cancer in his father and mother.  ROS:   Please see the history of present illness.     (+) Chest Discomfort  All other systems reviewed and are negative.  EKGs/Labs/Other Studies Reviewed:    The following studies were reviewed today:  CT Coronary Fractional Flow [place] 11/23/19:  1. CT FFR analysis showed severe stenosis in the mid to distal LAD, ostial portion of OM2 and ostial portion of the acute marginal branch. These vessels are too small for intervention. Aggressive risk factor modification, anginal therapy and abstinence from drugs are recommended.  Echo 11/23/2019: 1. Left ventricular ejection fraction, by visual estimation, is 50 to  55%. The left ventricle has normal function. There is no left ventricular  hypertrophy.   2. Global right  ventricle has normal systolic function.The right  ventricular size is normal. No increase in right ventricular wall  thickness.   3. Left atrial size was normal.   4. Right atrial size was mildly dilated.   5. The mitral valve is normal in structure. No evidence of mitral valve  regurgitation.   6. The tricuspid valve is normal in structure. Tricuspid valve  regurgitation is not demonstrated.   7. The aortic valve is grossly normal. Aortic valve regurgitation is  mild.   8. The pulmonic valve was normal in structure. Pulmonic valve  regurgitation is not visualized.   9. The atrial septum is grossly normal.   Vas [place] ABI 05/15/2019: Right: Resting right ankle-brachial index is within normal range. No  evidence of significant right lower extremity arterial disease.   Left: Resting left ankle-brachial index is within normal range. No evidence of significant left lower extremity arterial disease.   EKG:   05/08/2021: NSR, rate 82 bpm 05/30/2020: NSR  Recent Labs: 10/22/2022: BUN 40; Creatinine, Ser 1.35; Hemoglobin 12.1; Platelets 153; Potassium 4.8; Sodium 139  Recent Lipid Panel    Component Value Date/Time   CHOL 149 11/22/2019 2113   TRIG 133 02/12/2020 1928   HDL 25 (L) 11/22/2019 2113   CHOLHDL 6.0 11/22/2019 2113   VLDL 77 (H) 11/22/2019 2113   LDLCALC 47 11/22/2019 2113    Physical Exam:    VS:  BP 132/78   Pulse 60   Ht 6' (1.829 m)   Wt 179 lb 9.6 oz (81.5 kg)   SpO2 100%   BMI 24.36 kg/m     Wt Readings from Last 5 Encounters:  10/22/22 179 lb 9.6 oz (81.5 kg)  10/01/22 180 lb 9.6 oz (81.9 kg)  08/09/22 185 lb (83.9 kg)  01/27/22 170 lb (77.1 kg)  11/09/21 179 lb 12.8 oz (81.6 kg)    Constitutional: No acute distress Eyes: sclera non-icteric, normal conjunctiva and lids ENMT: normal dentition, moist mucous membranes Cardiovascular: regular rhythm, normal rate, no murmurs. S1 and S2 normal. Radial pulses normal bilaterally. No jugular venous distention.   Wound VAC in place over right  chest wall Respiratory: clear to auscultation bilaterally GI : normal bowel sounds, soft and nontender. No distention.   MSK: extremities warm, well perfused. No edema.  NEURO: grossly nonfocal exam, moves all extremities. PSYCH: alert and oriented x 3, normal mood and affect.   ASSESSMENT:    1. Progressive angina ([place])   2. Coronary artery disease of native artery of native heart with stable angina pectoris ([place])   3. Essential hypertension   4. Hyperlipidemia LDL goal <70   5. HIV infection, unspecified symptom status ([place])     PLAN:    Progressive angina Coronary artery disease involving native coronary artery of native heart with angina pectoris - patient has known severe CAD in small vessels that did not appear to be amenable to intervention by CT in 2020. Medical therapy recommended. Patient feels uncertain to continue medical therapy alone and is requesting definitive inquiry into his ongoing chest pain. Note, his chest pain has responded well to uptitrations of imdur, however now patient feels cardiac catheterization is warranted. Reasonable for [place] to define coronary anatomy further given ongoing symptoms. However, his case is complex in that he does not feel he can take prolonged aspirin therapy, and has a history of a SDH that has resolved as of head imaging 11/03/20, and a recent very complex right sided chest wound with large hematoma and infected abscess that has also resolved. We may need to pursue diagnostic angiogram +/- PCI based on results. I will discuss his care with the interventional cardiologist prior to procedure.  INFORMED CONSENT: I have reviewed the risks, indications, and alternatives to cardiac catheterization, possible angioplasty, and stenting with the patient. Risks include but are not limited to bleeding, infection, vascular injury, stroke, myocardial infarction, arrhythmia, kidney injury, radiation-related injury in the case of  prolonged fluoroscopy use, emergency cardiac surgery, and death. The patient understands the risks of serious complication is 1-2 in 1610 with diagnostic cardiac cath and 1-2% or less with angioplasty/stenting.    Essential hypertension-blood pressures well controlled today, continue propranolol.  He uses propranolol which also helps with his anxiety. Of note, this choice of beta blockade was due to coverage options at the time of initiation and patient preferred to stay on this therapy.   Hyperlipidemia, unspecified hyperlipidemia type -Currently on Zetia, LDL was 47 at last check. Now also on statin with permissible LFTs initiated by PCP. Agree. Continue crestor 10 mg daily.    Total time of encounter: 40 minutes total time of encounter, including 30 minutes spent in face-to-face patient care on the date of this encounter. This time includes coordination of care and counseling regarding above mentioned problem list. Remainder of non-face-to-face time involved reviewing chart documents/testing relevant to the patient encounter and documentation in the medical record. I have independently reviewed documentation from referring provider.   Cherlynn Kaiser, MD, [facility]     Medication Adjustments/Labs and Tests Ordered: Current medicines are reviewed at length with the patient today.  Concerns regarding medicines are outlined above.  Orders Placed This Encounter  Procedures   Basic metabolic panel   CBC   No orders of the defined types were placed in this encounter.   Patient Instructions  Medication Instructions:  No Changes In Medications at this time.  *If you need a refill on your cardiac medications before your next appointment, please call your pharmacy*  Lab Work: BMET AND CBC TODAY  If you have labs (blood work) drawn today and your tests are  completely normal, you will receive your results only by: MyChart Message (if you have MyChart) OR A paper  copy in the mail If you have any lab test that is abnormal or we need to change your treatment, we will call you to review the results.  Testing/Procedures: Your physician has requested that you have a cardiac catheterization. Cardiac catheterization is used to diagnose and/or treat various heart conditions. Doctors may recommend this procedure for a number of different reasons. The most common reason is to evaluate chest pain. Chest pain can be a symptom of coronary artery disease (CAD), and cardiac catheterization can show whether plaque is narrowing or blocking your heart's arteries. This procedure is also used to evaluate the valves, as well as measure the blood flow and oxygen levels in different parts of your heart. For further information please visit HugeFiesta.tn. Please follow instruction sheet, as given.  Follow-Up: At [facility], you and your health needs are our priority.  As part of our continuing mission to provide you with exceptional heart care, we have created designated Provider Care Teams.  These Care Teams include your primary Cardiologist (physician) and Advanced Practice Providers (APPs -  Physician Assistants and Nurse Practitioners) who all work together to provide you with the care you need, when you need it.  Your next appointment:   1-2 WEEKS POST CATH   The format for your next appointment:   In Person  Provider:   HAO MENG PA-C [place] PA-C  Other Instructions  [place] A DEPT OF [employer] [place] A DEPT OF . [facility] [place] [id number] [place] [address] Dept: [place]: [place]  10/22/2022  You are scheduled for a Cardiac Catheterization on Tuesday, November 7 with Dr. Glenetta Hew.  1. Please arrive at the [facility] (Main Entrance A) at [facility]: [address] [place], [place]  [address] at 8:30 AM (This time is two hours before your procedure to ensure your preparation). Free valet parking service is available.   Special note: Every effort is made to have your procedure done on time. Please understand that emergencies sometimes delay scheduled procedures.  2. Diet: Do not eat solid foods after midnight.  The patient may have clear liquids until 5am upon the day of the procedure.  3. Labs: You will need to have blood drawn on TODAY  4. Medication instructions in preparation for your procedure:   Contrast Allergy: No  On the morning of your procedure, take your Aspirin 81 mg and any morning medicines NOT listed above.  You may use sips of water.  5. Plan for one night stay--bring personal belongings. 6. Bring a current list of your medications and current insurance cards. 7. You MUST have a responsible person to drive you home. 8. Someone MUST be with you the first 24 hours after you arrive home or your discharge will be delayed. 9. Please wear clothes that are easy to get on and off and wear slip-on shoes.  Thank you for allowing [place] to care for you!   --  Invasive Cardiovascular services     I,Mary Mosetta Pigeon Buren,acting as a scribe for Elouise Munroe, MD.,have documented all relevant documentation on the behalf of Elouise Munroe, MD,as directed by  Elouise Munroe, MD while in the presence of Elouise Munroe, MD.   I, Elouise Munroe,  MD, have reviewed all documentation for the visit on 10/22/2022. The documentation on today's date of service for the exam, diagnosis, procedures, and orders are all accurate and complete.

## 2022-10-22 NOTE — Progress Notes (Signed)
Cardiology Office Note:    Date:  10/22/2022   ID:  [name], [name] 1951/03/03, MRN [id number]  PCP:  Randel Pigg, Dorma Russell, MD  Cardiologist:  Parke Poisson, MD  Electrophysiologist:  None   Referring MD: Randel Pigg, Dorma Russell, MD   Chief Complaint: CAD, hypotension and tachycardia, Pre-Op optimization needed  History of Present Illness:    Brian Malone is a 71 y.o. male with a history of diabetes, hypertension, HIV, hyperlipidemia, chronic kidney disease stage III.  Recent CT coronary angiography demonstrated moderate CAD with indeterminate FFR values.   Last visit, he presenting for a follow up after a hospitalization after a fall for rib fractures and hematoma. He came to clinic for risk stratification for debridement surgeries. At the time, his HIV was well controlled.   Today:  He "is in the best health he's been in years."  He does not have chest pain, but he does have chest discomfort. This is most commonly felt when he has a PTSD episode, but he feels them as well with strenuous exercise. This does not stop him from running, and he often does 5ks. He feels the pressure is not life-limiting.  He is now taking a statin. His LDL is 53. His triglycerides are 105. His liver function is stable.  His calcium score is 900.  He has inflammatory joint disease which is exacerbated by Asprin. He has had his joints aspirates several times, but no crystals have been found.   He denies any palpitations, shortness of breath, or peripheral edema. No lightheadedness, headaches, syncope, orthopnea, or PND.   Past Medical History:  Diagnosis Date   Acute subdural hematoma (HCC) 10/28/2019   Anxiety    Arthritis    Cancer (HCC)    basal cell on nose   Cellulitis of right upper extremity 11/04/2020   Depression    Diabetes mellitus without complication (HCC)    Dysphagia    GERD (gastroesophageal reflux disease)    Hepatitis C    High cholesterol    History of COVID-19  05/26/2020   History of kidney stones    HIV (human immunodeficiency virus infection) (HCC)    Hypertension    Pneumonia    Renal disorder    Tachycardia     Past Surgical History:  Procedure Laterality Date   ANKLE ARTHROSCOPY     APPENDECTOMY     APPLICATION OF WOUND VAC Right 03/25/2021   Procedure: APPLICATION OF WOUND VAC;  Surgeon: Corliss Skains, MD;  Location: [facility] OR;  Service: Thoracic;  Laterality: Right;   BACK SURGERY     FUSION   DEBRIDEMENT AND CLOSURE WOUND Right 05/14/2021   Procedure: Right chest wound excision;  Surgeon: Peggye Form, DO;  Location: [facility] OR;  Service: Plastics;  Laterality: Right;   INCISION AND DRAINAGE Right 03/25/2021   Procedure: INCISION AND DRAINAGE OF CHEST WALL;  Surgeon: Corliss Skains, MD;  Location: [facility] OR;  Service: Thoracic;  Laterality: Right;   INCISION AND DRAINAGE ABSCESS Right 03/19/2021   Procedure: INCISION AND DRAINAGE RIGHT CHEST ABSCESS;  Surgeon: Corliss Skains, MD;  Location: [facility] OR;  Service: Vascular;  Laterality: Right;   IR US GUIDE BX ASP/DRAIN  03/14/2021   IR US GUIDE BX ASP/DRAIN  03/14/2021   PENILE PROSTHESIS  REMOVAL  05/2020   Removal due to abscess   PENILE PROSTHESIS PLACEMENT  04/2020   SKIN SPLIT GRAFT Right 05/14/2021   Procedure: possible skin graft and Matriderm;  Surgeon: Peggye Form, DO;  Location: [facility];  Service: Plastics;  Laterality: Right;   SKIN SPLIT GRAFT Right 06/24/2021   Procedure: SKIN GRAFT SPLIT THICKNESS TO RIGHT CHEST;  Surgeon: Peggye Form, DO;  Location: [facility] OR;  Service: Plastics;  Laterality: Right;   THORACOTOMY Right 2008  ?   TONSILLECTOMY      Current Medications: No outpatient medications have been marked as taking for the 10/22/22 encounter (Appointment) with Parke Poisson, MD.     Allergies:   Adhesive [tape], Cyclobenzaprine, Duloxetine hcl, Morphine, Zyvox [linezolid], and Pregabalin   Social History   Socioeconomic History   Marital  status: Divorced    Spouse name: Not on file   Number of children: 2   Years of education: Not on file   Highest education level: Bachelor's degree (e.g., BA, AB, BS)  Occupational History   Not on file  Tobacco Use   Smoking status: Never   Smokeless tobacco: Never  Vaping Use   Vaping Use: Never used  Substance and Sexual Activity   Alcohol use: Not Currently   Drug use: Never   Sexual activity: Not on file  Other Topics Concern   Not on file  Social History Narrative   Pt is divorced   2 children   Right handed   Drinks soda, no coffee, no tea    Lives on the third floor of an apartment bldg. One story studio apartment   Social Determinants of Health   Financial Resource Strain: Not on file  Food Insecurity: Not on file  Transportation Needs: Not on file  Physical Activity: Not on file  Stress: Not on file  Social Connections: Not on file     Family History: The patient's family history includes CAD in his brother, father, and mother; Lung cancer in his father and mother.  ROS:   Please see the history of present illness.     (+) Chest Discomfort  All other systems reviewed and are negative.  EKGs/Labs/Other Studies Reviewed:    The following studies were reviewed today:  CT Coronary Fractional Flow Reserve 11/23/19:  1. CT FFR analysis showed severe stenosis in the mid to distal LAD, ostial portion of OM2 and ostial portion of the acute marginal branch. These vessels are too small for intervention. Aggressive risk factor modification, anginal therapy and abstinence from drugs are recommended.  Echo 11/23/2019: 1. Left ventricular ejection fraction, by visual estimation, is 50 to  55%. The left ventricle has normal function. There is no left ventricular  hypertrophy.   2. Global right ventricle has normal systolic function.The right  ventricular size is normal. No increase in right ventricular wall  thickness.   3. Left atrial size was normal.   4.  Right atrial size was mildly dilated.   5. The mitral valve is normal in structure. No evidence of mitral valve  regurgitation.   6. The tricuspid valve is normal in structure. Tricuspid valve  regurgitation is not demonstrated.   7. The aortic valve is grossly normal. Aortic valve regurgitation is  mild.   8. The pulmonic valve was normal in structure. Pulmonic valve  regurgitation is not visualized.   9. The atrial septum is grossly normal.   Vas [place] ABI 05/15/2019: Right: Resting right ankle-brachial index is within normal range. No  evidence of significant right lower extremity arterial disease.   Left: Resting left ankle-brachial index is within normal range. No evidence of significant left lower extremity arterial disease.  EKG:   05/08/2021: NSR, rate 82 bpm 05/30/2020: NSR  Recent Labs: No results found for requested labs within last 365 days.  Recent Lipid Panel    Component Value Date/Time   CHOL 149 11/22/2019 2113   TRIG 133 02/12/2020 1928   HDL 25 (L) 11/22/2019 2113   CHOLHDL 6.0 11/22/2019 2113   VLDL 77 (H) 11/22/2019 2113   LDLCALC 47 11/22/2019 2113    Physical Exam:    VS:  There were no vitals taken for this visit.    Wt Readings from Last 5 Encounters:  10/01/22 180 lb 9.6 oz (81.9 kg)  08/09/22 185 lb (83.9 kg)  01/27/22 170 lb (77.1 kg)  11/09/21 179 lb 12.8 oz (81.6 kg)  07/24/21 168 lb (76.2 kg)    Constitutional: No acute distress Eyes: sclera non-icteric, normal conjunctiva and lids ENMT: normal dentition, moist mucous membranes Cardiovascular: regular rhythm, normal rate, no murmurs. S1 and S2 normal. Radial pulses normal bilaterally. No jugular venous distention.  Wound VAC in place over right chest wall Respiratory: clear to auscultation bilaterally GI : normal bowel sounds, soft and nontender. No distention.   MSK: extremities warm, well perfused. No edema.  NEURO: grossly nonfocal exam, moves all extremities. PSYCH: alert and oriented  x 3, normal mood and affect.   ASSESSMENT:    No diagnosis found.  PLAN:    Pre-operative cardiovascular examination - Plan: EKG 12-Lead -EKG normal today and patient is asymptomatic. The patient is intermediate risk for general anesthesia.  He recently had general anesthesia 2 times in the last 6 weeks with no complications afterward.  He is stable on current cardiovascular regimen of Zetia, Imdur, propranolol.  No further cardiovascular testing is required prior to the procedure.  If this level of risk is acceptable to the patient and surgical team, the patient should be considered optimized from a cardiovascular standpoint.  Patient understands risk stratification and is willing to proceed with surgical recommendations.  Essential hypertension-blood pressures well controlled today, continue propranolol.  He uses propranolol which also helps with his anxiety.  Coronary artery disease involving native coronary artery of native heart without angina pectoris -Patient has moderate CAD with indeterminate FFR and no chest pain.  Continue medical management of chest pain and angina.  No recent angina with addition of nitrate.  Hyperlipidemia, unspecified hyperlipidemia type -Currently on Zetia, LDL was 47 at last check.   Weston Brass, MD [place]  [facility]    Medication Adjustments/Labs and Tests Ordered: Current medicines are reviewed at length with the patient today.  Concerns regarding medicines are outlined above.  No orders of the defined types were placed in this encounter.  No orders of the defined types were placed in this encounter.   There are no Patient Instructions on file for this visit.    I,Mary Shauna Hugh Buren,acting as a scribe for Parke Poisson, MD.,have documented all relevant documentation on the behalf of Parke Poisson, MD,as directed by  Parke Poisson, MD while in the presence of Parke Poisson, MD.   ***

## 2022-10-23 LAB — BASIC METABOLIC PANEL
BUN/Creatinine Ratio: 30 — ABNORMAL HIGH (ref 10–24)
BUN: 40 mg/dL — ABNORMAL HIGH (ref 8–27)
CO2: 26 mmol/L (ref 20–29)
Calcium: 9.3 mg/dL (ref 8.6–10.2)
Chloride: 102 mmol/L (ref 96–106)
Creatinine, Ser: 1.35 mg/dL — ABNORMAL HIGH (ref 0.76–1.27)
Glucose: 89 mg/dL (ref 70–99)
Potassium: 4.8 mmol/L (ref 3.5–5.2)
Sodium: 139 mmol/L (ref 134–144)
eGFR: 57 mL/min/{1.73_m2} — ABNORMAL LOW (ref >59)

## 2022-10-23 LAB — CBC
Hematocrit: 37.2 % — ABNORMAL LOW (ref 37.5–51.0)
Hemoglobin: 12.1 g/dL — ABNORMAL LOW (ref 13.0–17.7)
MCH: 30 pg (ref 26.6–33.0)
MCHC: 32.5 g/dL (ref 31.5–35.7)
MCV: 92 fL (ref 79–97)
Platelets: 153 10*3/uL (ref 150–450)
RBC: 4.03 x10E6/uL — ABNORMAL LOW (ref 4.14–5.80)
RDW: 13 % (ref 11.6–15.4)
WBC: 4.4 10*3/uL (ref 3.4–10.8)

## 2022-10-25 ENCOUNTER — Telehealth: Payer: Self-pay | Admitting: *Deleted

## 2022-10-25 NOTE — Telephone Encounter (Addendum)
Cardiac Catheterization scheduled at [facility] for: Tuesday October 26, 2022 10:30 AM Arrival time and place: [place] Entrance A at: 8:30 AM  Nothing to eat after midnight prior to procedure, clear liquids until 5 AM day of procedure.  Medication instructions: -Usual morning medications can be taken with sips of water including aspirin 81 mg.  Patient reports he discussed taking aspirin with Dr Margaretann Loveless and feels he can take one 81 mg aspirin the morning of the procedure.   Confirmed patient has responsible adult to drive home post procedure and be with patient first 24 hours after arriving home.  Patient reports no new symptoms concerning for COVID-19 in the past 10 days.  Ozempic-weekly-patient reports he takes weekly on Thursday or Friday.  Reviewed procedure instructions with patient.

## 2022-10-26 ENCOUNTER — Encounter (HOSPITAL_COMMUNITY)
Admission: RE | Disposition: A | Discharge status: Home / Self Care | Payer: Self-pay | Source: Home / Self Care | Attending: Cardiology

## 2022-10-26 ENCOUNTER — Ambulatory Visit (HOSPITAL_COMMUNITY)
Admission: RE | Admit: 2022-10-26 | Discharge: 2022-10-26 | Disposition: A | Discharge status: Home / Self Care | Payer: Medicare Other | Attending: Cardiology | Admitting: Cardiology

## 2022-10-26 ENCOUNTER — Other Ambulatory Visit (HOSPITAL_COMMUNITY): Payer: Self-pay

## 2022-10-26 ENCOUNTER — Other Ambulatory Visit: Payer: Self-pay

## 2022-10-26 DIAGNOSIS — E785 Hyperlipidemia, unspecified: Secondary | ICD-10-CM | POA: Insufficient documentation

## 2022-10-26 DIAGNOSIS — I25110 Atherosclerotic heart disease of native coronary artery with unstable angina pectoris: Secondary | ICD-10-CM | POA: Diagnosis present

## 2022-10-26 DIAGNOSIS — I129 Hypertensive chronic kidney disease with stage 1 through stage 4 chronic kidney disease, or unspecified chronic kidney disease: Secondary | ICD-10-CM | POA: Diagnosis not present

## 2022-10-26 DIAGNOSIS — I200 Unstable angina: Secondary | ICD-10-CM

## 2022-10-26 DIAGNOSIS — I2584 Coronary atherosclerosis due to calcified coronary lesion: Secondary | ICD-10-CM | POA: Diagnosis not present

## 2022-10-26 DIAGNOSIS — N183 Chronic kidney disease, stage 3 unspecified: Secondary | ICD-10-CM | POA: Insufficient documentation

## 2022-10-26 DIAGNOSIS — Z7985 Long-term (current) use of injectable non-insulin antidiabetic drugs: Secondary | ICD-10-CM | POA: Insufficient documentation

## 2022-10-26 DIAGNOSIS — R931 Abnormal findings on diagnostic imaging of heart and coronary circulation: Secondary | ICD-10-CM

## 2022-10-26 DIAGNOSIS — Z79899 Other long term (current) drug therapy: Secondary | ICD-10-CM | POA: Diagnosis not present

## 2022-10-26 DIAGNOSIS — E1122 Type 2 diabetes mellitus with diabetic chronic kidney disease: Secondary | ICD-10-CM | POA: Diagnosis not present

## 2022-10-26 DIAGNOSIS — Z955 Presence of coronary angioplasty implant and graft: Secondary | ICD-10-CM | POA: Insufficient documentation

## 2022-10-26 DIAGNOSIS — B20 Human immunodeficiency virus [HIV] disease: Secondary | ICD-10-CM | POA: Diagnosis not present

## 2022-10-26 HISTORY — PX: LEFT HEART CATH AND CORONARY ANGIOGRAPHY: CATH118249

## 2022-10-26 HISTORY — PX: CORONARY STENT INTERVENTION: CATH118234

## 2022-10-26 LAB — GLUCOSE, CAPILLARY: Glucose-Capillary: 134 mg/dL — ABNORMAL HIGH (ref 70–99)

## 2022-10-26 LAB — POCT ACTIVATED CLOTTING TIME: Activated Clotting Time: 275 seconds

## 2022-10-26 SURGERY — LEFT HEART CATH AND CORONARY ANGIOGRAPHY
Anesthesia: LOCAL

## 2022-10-26 MED ORDER — ACETAMINOPHEN 325 MG PO TABS: 650.0000 mg | ORAL_TABLET | ORAL | Status: DC | PRN

## 2022-10-26 MED ORDER — SODIUM CHLORIDE 0.9 % IV SOLN: 250.0000 mL | INTRAVENOUS | Status: DC | PRN

## 2022-10-26 MED ORDER — CLOPIDOGREL BISULFATE 300 MG PO TABS
ORAL_TABLET | ORAL | Status: DC | PRN
  Administered 2022-10-26: 600 mg via ORAL

## 2022-10-26 MED ORDER — ONDANSETRON HCL 4 MG/2ML IJ SOLN: 4.0000 mg | Freq: Four times a day (QID) | INTRAMUSCULAR | Status: DC | PRN

## 2022-10-26 MED ORDER — LIDOCAINE HCL (PF) 1 % IJ SOLN
INTRAMUSCULAR | Status: DC | PRN
  Administered 2022-10-26: 2 mL

## 2022-10-26 MED ORDER — LIDOCAINE HCL (PF) 1 % IJ SOLN
INTRAMUSCULAR | Status: AC
  Filled 2022-10-26: qty 30

## 2022-10-26 MED ORDER — ASPIRIN 81 MG PO CHEW: 81.0000 mg | CHEWABLE_TABLET | Freq: Every day | ORAL | Status: DC

## 2022-10-26 MED ORDER — NITROGLYCERIN 0.4 MG SL SUBL
0.4000 mg | SUBLINGUAL_TABLET | SUBLINGUAL | 2 refills | Status: DC | PRN
  Filled 2022-10-26: qty 25, 14d supply, fill #0

## 2022-10-26 MED ORDER — ASPIRIN 81 MG PO CHEW: 81.0000 mg | CHEWABLE_TABLET | ORAL | Status: DC

## 2022-10-26 MED ORDER — NITROGLYCERIN 1 MG/10 ML FOR IR/CATH LAB
INTRA_ARTERIAL | Status: DC | PRN
  Administered 2022-10-26: 200 ug via INTRACORONARY

## 2022-10-26 MED ORDER — MIDAZOLAM HCL 2 MG/2ML IJ SOLN
INTRAMUSCULAR | Status: AC
  Filled 2022-10-26: qty 2

## 2022-10-26 MED ORDER — HEPARIN SODIUM (PORCINE) 1000 UNIT/ML IJ SOLN
INTRAMUSCULAR | Status: DC | PRN
  Administered 2022-10-26: 4000 [IU] via INTRAVENOUS
  Administered 2022-10-26: 4500 [IU] via INTRAVENOUS

## 2022-10-26 MED ORDER — FAMOTIDINE IN NACL 20-0.9 MG/50ML-% IV SOLN
INTRAVENOUS | Status: AC | PRN
  Administered 2022-10-26: 20 mg via INTRAVENOUS

## 2022-10-26 MED ORDER — FENTANYL CITRATE (PF) 100 MCG/2ML IJ SOLN
INTRAMUSCULAR | Status: AC
  Filled 2022-10-26: qty 2

## 2022-10-26 MED ORDER — SODIUM CHLORIDE 0.9 % WEIGHT BASED INFUSION
3.0000 mL/kg/h | INTRAVENOUS | Status: AC
  Administered 2022-10-26: 3 mL/kg/h via INTRAVENOUS

## 2022-10-26 MED ORDER — LABETALOL HCL 5 MG/ML IV SOLN: 10.0000 mg | INTRAVENOUS | Status: DC | PRN

## 2022-10-26 MED ORDER — NITROGLYCERIN 1 MG/10 ML FOR IR/CATH LAB
INTRA_ARTERIAL | Status: AC
  Filled 2022-10-26: qty 10

## 2022-10-26 MED ORDER — ASPIRIN 81 MG PO TBEC: 81.0000 mg | DELAYED_RELEASE_TABLET | Freq: Every day | ORAL | 0 refills | Status: DC

## 2022-10-26 MED ORDER — SODIUM CHLORIDE 0.9% FLUSH: 3.0000 mL | Freq: Two times a day (BID) | INTRAVENOUS | Status: DC

## 2022-10-26 MED ORDER — SODIUM CHLORIDE 0.9 % WEIGHT BASED INFUSION: 1.0000 mL/kg/h | INTRAVENOUS | Status: DC

## 2022-10-26 MED ORDER — HEPARIN SODIUM (PORCINE) 1000 UNIT/ML IJ SOLN
INTRAMUSCULAR | Status: AC
  Filled 2022-10-26: qty 10

## 2022-10-26 MED ORDER — ROSUVASTATIN CALCIUM 20 MG PO TABS: 10.0000 mg | ORAL_TABLET | Freq: Every day | ORAL | 1 refills | Status: DC

## 2022-10-26 MED ORDER — ROSUVASTATIN CALCIUM 20 MG PO TABS: 20.0000 mg | ORAL_TABLET | Freq: Every day | ORAL | 1 refills | Status: AC

## 2022-10-26 MED ORDER — HEPARIN (PORCINE) IN NACL 1000-0.9 UT/500ML-% IV SOLN
INTRAVENOUS | Status: DC | PRN
  Administered 2022-10-26 (×2): 500 mL

## 2022-10-26 MED ORDER — FENTANYL CITRATE (PF) 100 MCG/2ML IJ SOLN
INTRAMUSCULAR | Status: DC | PRN
  Administered 2022-10-26: 50 ug via INTRAVENOUS
  Administered 2022-10-26 (×2): 25 ug via INTRAVENOUS

## 2022-10-26 MED ORDER — MIDAZOLAM HCL 2 MG/2ML IJ SOLN
INTRAMUSCULAR | Status: DC | PRN
  Administered 2022-10-26: 1 mg via INTRAVENOUS
  Administered 2022-10-26: 2 mg via INTRAVENOUS

## 2022-10-26 MED ORDER — HEPARIN (PORCINE) IN NACL 1000-0.9 UT/500ML-% IV SOLN
INTRAVENOUS | Status: AC
  Filled 2022-10-26: qty 500

## 2022-10-26 MED ORDER — VERAPAMIL HCL 2.5 MG/ML IV SOLN
INTRAVENOUS | Status: DC | PRN
  Administered 2022-10-26: 10 mL via INTRA_ARTERIAL

## 2022-10-26 MED ORDER — HYDRALAZINE HCL 20 MG/ML IJ SOLN: 10.0000 mg | INTRAMUSCULAR | Status: DC | PRN

## 2022-10-26 MED ORDER — SODIUM CHLORIDE 0.9% FLUSH: 3.0000 mL | INTRAVENOUS | Status: DC | PRN

## 2022-10-26 MED ORDER — CLOPIDOGREL BISULFATE 75 MG PO TABS
75.0000 mg | ORAL_TABLET | Freq: Every day | ORAL | 2 refills | Status: DC
  Filled 2022-10-26: qty 30, 30d supply, fill #0

## 2022-10-26 MED ORDER — CLOPIDOGREL BISULFATE 75 MG PO TABS: 75.0000 mg | ORAL_TABLET | Freq: Every day | ORAL | Status: DC

## 2022-10-26 MED ORDER — FAMOTIDINE IN NACL 20-0.9 MG/50ML-% IV SOLN
INTRAVENOUS | Status: AC
  Filled 2022-10-26: qty 50

## 2022-10-26 MED ORDER — VERAPAMIL HCL 2.5 MG/ML IV SOLN
INTRAVENOUS | Status: AC
  Filled 2022-10-26: qty 2

## 2022-10-26 MED ORDER — IOHEXOL 350 MG/ML SOLN
INTRAVENOUS | Status: DC | PRN
  Administered 2022-10-26: 170 mL

## 2022-10-26 MED ORDER — CLOPIDOGREL BISULFATE 75 MG PO TABS: 75.0000 mg | ORAL_TABLET | Freq: Every day | ORAL | 0 refills | Status: DC

## 2022-10-26 MED ORDER — ASPIRIN 81 MG PO TBEC
81.0000 mg | DELAYED_RELEASE_TABLET | Freq: Every day | ORAL | 1 refills | Status: DC
  Filled 2022-10-26: qty 90, 90d supply, fill #0

## 2022-10-26 MED ORDER — CLOPIDOGREL BISULFATE 300 MG PO TABS
ORAL_TABLET | ORAL | Status: AC
  Filled 2022-10-26: qty 2

## 2022-10-26 SURGICAL SUPPLY — 21 items
BALL SAPPHIRE NC24 2.75X18 (BALLOONS) ×1
BALLN SAPPHIRE 2.25X20 (BALLOONS) ×1
BALLOON SAPPHIRE 2.25X20 (BALLOONS) IMPLANT
BALLOON SAPPHIRE NC24 2.75X18 (BALLOONS) IMPLANT
BAND CMPR LRG ZPHR (HEMOSTASIS) ×1
BAND ZEPHYR COMPRESS 30 LONG (HEMOSTASIS) IMPLANT
CATH INFINITI 5FR ANG PIGTAIL (CATHETERS) IMPLANT
CATH OPTITORQUE TIG 4.0 5F (CATHETERS) IMPLANT
CATH VISTA GUIDE 6FR XBLAD3.5 (CATHETERS) IMPLANT
GLIDESHEATH SLEND SS 6F .021 (SHEATH) IMPLANT
GUIDEWIRE INQWIRE 1.5J.035X260 (WIRE) IMPLANT
INQWIRE 1.5J .035X260CM (WIRE) ×1
KIT ENCORE 26 ADVANTAGE (KITS) IMPLANT
KIT HEART LEFT (KITS) ×1 IMPLANT
PACK CARDIAC CATHETERIZATION (CUSTOM PROCEDURE TRAY) ×1 IMPLANT
SHEATH PROBE COVER 6X72 (BAG) IMPLANT
STENT SYNERGY XD 2.50X28 (Permanent Stent) IMPLANT
SYNERGY XD 2.50X28 (Permanent Stent) ×1 IMPLANT
TRANSDUCER W/STOPCOCK (MISCELLANEOUS) ×1 IMPLANT
TUBING CIL FLEX 10 FLL-RA (TUBING) ×1 IMPLANT
WIRE ASAHI PROWATER 180CM (WIRE) IMPLANT

## 2022-10-26 NOTE — Progress Notes (Signed)
Pt was educated on stent card, stent location restrictions, Aspirin & Plavix, ex guidelines, S/S of MI, NTG use and calling 911, heart healthy diet, and CRPII. Pt is being referred to CRPII at [facility].

## 2022-10-26 NOTE — Discharge Summary (Signed)
Discharge Summary for Same Day PCI   Patient ID: Brian Malone MRN: [id number]; DOB: 1951-08-10  Admit date: 10/26/2022 Discharge date: 10/26/2022  Primary Care Provider: Doreatha Lew, MD  Primary Cardiologist: Elouise Munroe, MD  Primary Electrophysiologist:  None   Discharge Diagnoses    Active Problems:   Progressive angina [facility])   Abnormal cardiac CT angiography  Diagnostic Studies/Procedures    Cardiac Catheterization 10/26/2022:    CULPRIT LESION: Mid LAD to Dist LAD lesion is 75% stenosed. (FFRct 0.76)   A drug-eluting stent was successfully placed using a SYNERGY XD 2.50X28. -Postdilated to 2.8 mm   Post intervention, there is a 0% residual stenosis.   ----------------------------------------------   2nd Diag lesion is 80% stenosed.   LPAV lesion is 80% stenosed.   Prox RCA to Mid RCA lesion is 50% stenosed.   ----------------------------------------------   The left ventricular systolic function is normal.  The left ventricular ejection fraction is 50-55% by visual estimate.   LV end diastolic pressure is normal.   There is no aortic valve stenosis.   POSTPROCEDURE DIAGNOSES Severe ~3 Vessel CAD Culprit Lesion: Mid LAD eccentric/irregular, mildly calcified 75-85% stenosis ->  successfully treated with DES PCI: Synergy XD 2.5 x 28 postdilated to 2.8 mm. Reduced to 0%.  TIMI-3 flow pre and post. Proximal D3 (small caliber vessel 80% stenosis-at most 2 mm vessel, not favorable for PCI target) Distal AVG LCx 70 to 80% stenosis (1 mm vessel) Preserved LVEF of roughly 50 to 55% with no obvious RWMA and normal EDP.      RECOMMENDATIONS Okay for same-day discharge. We will plan DAPT for 3 months and continue Plavix to try to complete 1 year.  Okay to interrupt at 6 months Continue aggressive risk modification-lipid, glycemic and blood pressure control.     Glenetta Hew, MD  Diagnostic Dominance: Co-dominant  Intervention   _____________    History of Present Illness     Brian Malone is a 71 y.o. male with HTN, DM, HIV, hyperlipidemia, CKD stage III, hepatitis C who is followed by Dr. Monte Fantasia as an outpatient.  Initially presented to the office complaining of chest discomfort with exertion.  He was set up for outpatient coronary CT which demonstrated moderate CAD with indeterminate FFR values.  Given his ongoing symptoms he was set up for outpatient cardiac catheterization.  Hospital Course     The patient underwent cardiac cath as noted above with severe three-vessel CAD with culprit lesion felt to be mid LAD 85% stenosis treated with PCI/DES x1, proximal D3 80% stenosis though small caliber vessel not favorable for PCI, as well as distal left circumflex 80% stenosis small caliber vessel. Plan for DAPT with ASA/Plavix for at least 3 months with continuation of plavix for a total of a year. The patient was seen by cardiac rehab while in short stay. There were no observed complications post cath. Radial cath site was re-evaluated prior to discharge and found to be stable without any complications. Instructions/precautions regarding cath site care were given prior to discharge. Increased Crestor to 50m daily.  WLavina Hammanwas seen by Dr. HEllyn Hackand determined stable for discharge home. Follow up with our office has been arranged. Medications are listed below. Pertinent changes include addition of plavix. ____________  Cath/PCI Registry Performance & Quality Measures: Aspirin prescribed? - Yes ADP Receptor Inhibitor (Plavix/Clopidogrel, Brilinta/Ticagrelor or Effient/Prasugrel) prescribed (includes medically managed patients)? - Yes High Intensity Statin (Lipitor 40-826mor Crestor 20-4031mprescribed? - Yes  For EF <40%, was ACEI/ARB prescribed? - Not Applicable (EF >/= 31%) For EF <40%, Aldosterone Antagonist (Spironolactone or Eplerenone) prescribed? - Not Applicable (EF >/= 51%) Cardiac Rehab Phase II ordered (Included  Medically managed Patients)? - Yes _____________  Discharge Vitals Blood pressure 120/76, pulse 64, temperature 97.9 F (36.6 C), temperature source Oral, resp. rate 17, height 6' (1.829 m), weight 81.2 kg, SpO2 97 %.  Filed Weights   10/26/22 0911  Weight: 81.2 kg    Last Labs & Radiologic Studies    CBC No results for input(s): "WBC", "NEUTROABS", "HGB", "HCT", "MCV", "PLT" in the last 72 hours. Basic Metabolic Panel No results for input(s): "NA", "K", "CL", "CO2", "GLUCOSE", "BUN", "CREATININE", "CALCIUM", "MG", "PHOS" in the last 72 hours. Liver Function Tests No results for input(s): "AST", "ALT", "ALKPHOS", "BILITOT", "PROT", "ALBUMIN" in the last 72 hours. No results for input(s): "LIPASE", "AMYLASE" in the last 72 hours. High Sensitivity Troponin:   No results for input(s): "TROPONINIHS" in the last 720 hours.  BNP Invalid input(s): "POCBNP" D-Dimer No results for input(s): "DDIMER" in the last 72 hours. Hemoglobin A1C No results for input(s): "HGBA1C" in the last 72 hours. Fasting Lipid Panel No results for input(s): "CHOL", "HDL", "LDLCALC", "TRIG", "CHOLHDL", "LDLDIRECT" in the last 72 hours. Thyroid Function Tests No results for input(s): "TSH", "T4TOTAL", "T3FREE", "THYROIDAB" in the last 72 hours.  Invalid input(s): "FREET3" _____________  CARDIAC CATHETERIZATION  Result Date: 10/26/2022   CULPRIT LESION: Mid LAD to Dist LAD lesion is 75% stenosed. (FFRct 0.76)   A drug-eluting stent was successfully placed using a SYNERGY XD 2.50X28. -Postdilated to 2.8 mm   Post intervention, there is a 0% residual stenosis.   ----------------------------------------------   2nd Diag lesion is 80% stenosed.   LPAV lesion is 80% stenosed.   Prox RCA to Mid RCA lesion is 50% stenosed.   ----------------------------------------------   The left ventricular systolic function is normal.  The left ventricular ejection fraction is 50-55% by visual estimate.   LV end diastolic pressure is  normal.   There is no aortic valve stenosis. POSTPROCEDURE DIAGNOSES Severe ~3 Vessel CAD Culprit Lesion: Mid LAD eccentric/irregular, mildly calcified 75-85% stenosis -> successfully treated with DES PCI: Synergy XD 2.5 x 28 postdilated to 2.8 mm. Reduced to 0%.  TIMI-3 flow pre and post. Proximal D3 (small caliber vessel 80% stenosis-at most 2 mm vessel, not favorable for PCI target) Distal AVG LCx 70 to 80% stenosis (1 mm vessel) Preserved LVEF of roughly 50 to 55% with no obvious RWMA and normal EDP. RECOMMENDATIONS Okay for same-day discharge. We will plan DAPT for 3 months and continue Plavix to try to complete 1 year.  Okay to interrupt at 6 months Continue aggressive risk modification-lipid, glycemic and blood pressure control. Glenetta Hew, MD   Disposition   Pt is being discharged home today in good condition.  Follow-up Plans & Appointments     Follow-up Information     Almyra Deforest, [place] Follow up on 11/09/2022.   Specialties: Cardiology, Radiology Why: at 8:50am for your follow up appt Contact information: [address] [place] [address] [phone]                Discharge Instructions     AMB Referral to Cardiac Rehabilitation - Phase II   Complete by: As directed    Diagnosis:  Stable Angina Coronary Stents     After initial evaluation and assessments completed: Virtual Based Care may be provided alone or in  conjunction with Phase 2 Cardiac Rehab based on patient barriers.: Yes   Intensive Cardiac Rehabilitation (ICR) [place] location only OR Traditional Cardiac Rehabilitation (TCR) *If criteria for ICR are not met will enroll in TCR [facility] only): Yes      Discharge Medications   Allergies as of 10/26/2022       Reactions   Adhesive [tape] Other (See Comments)   "Tape will take off my skin, as will Band-Aids"   Cyclobenzaprine Other (See Comments)   Hallucinations   Duloxetine Hcl Other (See Comments)   Makes PTSD- induced nightmares more  vivid   Morphine Itching, Other (See Comments)   In IV form causes hallucinations, aggression, and makes patient altered also only.  Oral morphine is ok   Zyvox [linezolid] Nausea And Vomiting   And headaches   Pregabalin Anxiety   Pt reports insomnia and worsening anxiety         Medication List     TAKE these medications    acetaminophen 325 MG tablet Commonly known as: TYLENOL Take 650 mg by mouth every 6 (six) hours as needed for mild pain.   Aspirin Low Dose 81 MG tablet Generic drug: aspirin EC Take 1 tablet (81 mg total) by mouth daily. Swallow whole.   buprenorphine 20 MCG/HR Ptwk Commonly known as: BUTRANS Place 1 patch onto the skin once a week.   busPIRone 30 MG tablet Commonly known as: BUSPAR Take 10-30 mg by mouth See admin instructions. Take 10 mg in the morning and 30 mg at bedtime   clonazePAM 0.5 MG tablet Commonly known as: KLONOPIN Take 0.5 mg by mouth in the morning, at noon, and at bedtime. Adjust with anxiety level   clopidogrel 75 MG tablet Commonly known as: Plavix Take 1 tablet (75 mg total) by mouth daily.   docusate sodium 250 MG capsule Commonly known as: COLACE Take 250 mg by mouth in the morning and at bedtime.   Ensure Max Protein Liqd Take 11 oz by mouth 3 (three) times daily.   ezetimibe 10 MG tablet Commonly known as: ZETIA Take 10 mg by mouth every morning.   isosorbide mononitrate 60 MG 24 hr tablet Commonly known as: IMDUR TAKE 1 TABLET(60 MG) BY MOUTH DAILY   loratadine 10 MG tablet Commonly known as: CLARITIN Take 10 mg by mouth every evening.   LUBRICANT DROPS OP Place 1 drop into both eyes daily as needed (dry eye).   LUBRICATING EYE DROPS OP Place 1 drop into both eyes daily as needed (dry eyes).   nitroGLYCERIN 0.4 MG SL tablet Commonly known as: Nitrostat Place 1 tablet (0.4 mg total) under the tongue every 5 (five) minutes as needed.   ondansetron 8 MG disintegrating tablet Commonly known as:  ZOFRAN-ODT Take 8 mg by mouth every 8 (eight) hours as needed for nausea or vomiting.   pantoprazole 40 MG tablet Commonly known as: PROTONIX Take 40 mg by mouth daily before breakfast.   polyethylene glycol 17 g packet Commonly known as: MIRALAX / GLYCOLAX Take 17 g by mouth daily. Capful   propranolol ER 60 MG 24 hr capsule Commonly known as: INDERAL LA Take 60 mg by mouth every evening.   rosuvastatin 20 MG tablet Commonly known as: CRESTOR Take 1 tablet (20 mg total) by mouth at bedtime. What changed:  medication strength how much to take   Semaglutide(0.25 or 0.5MG/DOS) 2 MG/1.5ML Sopn Inject 0.5 mg into the skin once a week.   tamsulosin 0.4 MG Caps capsule Commonly  known as: FLOMAX Take 0.4 mg by mouth at bedtime.   tiZANidine 4 MG tablet Commonly known as: ZANAFLEX Take 2-4 mg by mouth at bedtime.        Allergies Allergies  Allergen Reactions   Adhesive [Tape] Other (See Comments)    "Tape will take off my skin, as will Band-Aids"   Cyclobenzaprine Other (See Comments)    Hallucinations   Duloxetine Hcl Other (See Comments)    Makes PTSD- induced nightmares more vivid    Morphine Itching and Other (See Comments)    In IV form causes hallucinations, aggression, and makes patient altered also only.  Oral morphine is ok     Zyvox [Linezolid] Nausea And Vomiting    And headaches   Pregabalin Anxiety    Pt reports insomnia and worsening anxiety     Outstanding Labs/Studies   FLP/LFTs in 8 weeks   Duration of Discharge Encounter   Greater than 30 minutes including physician time.  Signed, Reino Bellis, NP 10/26/2022, 3:43 PM

## 2022-10-26 NOTE — Discharge Instructions (Signed)

## 2022-10-26 NOTE — Interval H&P Note (Signed)
History and Physical Interval Note:  10/26/2022 12:20 PM  Brian Malone  has presented today for surgery, with the diagnosis of progressive angina -with abnormal Coronary CTA.  The various methods of treatment have been discussed with the patient and family. After consideration of risks, benefits and other options for treatment, the patient has consented to  Procedure(s): LEFT HEART CATH AND CORONARY ANGIOGRAPHY (N/A) as a surgical intervention.  The patient's history has been reviewed, patient examined, no change in status, stable for surgery.  I have reviewed the patient's chart and labs.  Questions were answered to the patient's satisfaction.    Cath Lab Visit (complete for each Cath Lab visit)  Clinical Evaluation Leading to the Procedure:   ACS: No.  Non-ACS:    Anginal Classification: CCS II  Anti-ischemic medical therapy: Minimal Therapy (1 class of medications)  Non-Invasive Test Results: High-risk stress test findings: cardiac mortality >3%/year  Prior CABG: No previous CABG   Glenetta Hew

## 2022-10-27 ENCOUNTER — Encounter (HOSPITAL_COMMUNITY): Payer: Self-pay | Admitting: Cardiology

## 2022-11-04 ENCOUNTER — Other Ambulatory Visit (HOSPITAL_COMMUNITY): Payer: Self-pay

## 2022-11-09 ENCOUNTER — Encounter: Payer: Self-pay | Admitting: Physician Assistant

## 2022-11-09 ENCOUNTER — Ambulatory Visit: Payer: Medicare Other | Attending: Physician Assistant | Admitting: Physician Assistant

## 2022-11-09 VITALS — BP 102/68 | HR 83 | Ht 72.0 in | Wt 181.2 lb

## 2022-11-09 DIAGNOSIS — I10 Essential (primary) hypertension: Secondary | ICD-10-CM | POA: Insufficient documentation

## 2022-11-09 DIAGNOSIS — I200 Unstable angina: Secondary | ICD-10-CM

## 2022-11-09 DIAGNOSIS — E785 Hyperlipidemia, unspecified: Secondary | ICD-10-CM | POA: Diagnosis not present

## 2022-11-09 DIAGNOSIS — I2510 Atherosclerotic heart disease of native coronary artery without angina pectoris: Secondary | ICD-10-CM | POA: Insufficient documentation

## 2022-11-09 DIAGNOSIS — E118 Type 2 diabetes mellitus with unspecified complications: Secondary | ICD-10-CM | POA: Diagnosis not present

## 2022-11-09 NOTE — Patient Instructions (Signed)
Medication Instructions:  No Changes *If you need a refill on your cardiac medications before your next appointment, please call your pharmacy*   Lab Work: No Labs If you have labs (blood work) drawn today and your tests are completely normal, you will receive your results only by: [place] (if you have MyChart) OR A paper copy in the mail If you have any lab test that is abnormal or we need to change your treatment, we will call you to review the results.   Testing/Procedures: No Testing   Follow-Up: At [facility], you and your health needs are our priority.  As part of our continuing mission to provide you with exceptional heart care, we have created designated Provider Care Teams.  These Care Teams include your primary Cardiologist (physician) and Advanced Practice Providers (APPs -  Physician Assistants and Nurse Practitioners) who all work together to provide you with the care you need, when you need it.  We recommend signing up for the patient portal called "MyChart".  Sign up information is provided on this After Visit Summary.  MyChart is used to connect with patients for Virtual Visits (Telemedicine).  Patients are able to view lab/test results, encounter notes, upcoming appointments, etc.  Non-urgent messages can be sent to your provider as well.   To learn more about what you can do with MyChart, go to [email or web address].    Your next appointment:   3 month(s)  The format for your next appointment:   In Person  Provider:   Elouise Munroe, MD
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
## 2022-11-11 ENCOUNTER — Encounter: Payer: Self-pay | Admitting: Physician Assistant

## 2022-12-02 ENCOUNTER — Telehealth: Payer: Self-pay | Admitting: Podiatry

## 2022-12-02 NOTE — Telephone Encounter (Signed)
Left message on vm for patient to call back to set up appt to pick up diabetic shoes  Exp  01/22/23

## 2022-12-03 ENCOUNTER — Ambulatory Visit (INDEPENDENT_AMBULATORY_CARE_PROVIDER_SITE_OTHER): Payer: Medicare Other | Admitting: Podiatry

## 2022-12-03 DIAGNOSIS — M7752 Other enthesopathy of left foot: Secondary | ICD-10-CM

## 2022-12-03 DIAGNOSIS — M7751 Other enthesopathy of right foot: Secondary | ICD-10-CM

## 2022-12-03 DIAGNOSIS — E1143 Type 2 diabetes mellitus with diabetic autonomic (poly)neuropathy: Secondary | ICD-10-CM
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
## 2022-12-14 ENCOUNTER — Ambulatory Visit: Payer: Medicare Other | Admitting: Neurology

## 2023-02-16 ENCOUNTER — Ambulatory Visit: Payer: Medicare Other | Admitting: Internal Medicine

## 2023-03-03 ENCOUNTER — Ambulatory Visit: Payer: Medicare Other | Attending: Internal Medicine | Admitting: Internal Medicine

## 2023-03-03 ENCOUNTER — Encounter: Payer: Self-pay | Admitting: Internal Medicine

## 2023-03-03 VITALS — BP 110/62 | HR 61 | Ht 72.0 in | Wt 177.6 lb

## 2023-03-03 DIAGNOSIS — I10 Essential (primary) hypertension: Secondary | ICD-10-CM | POA: Diagnosis present

## 2023-03-03 DIAGNOSIS — Z955 Presence of coronary angioplasty implant and graft: Secondary | ICD-10-CM | POA: Diagnosis present

## 2023-03-03 DIAGNOSIS — B20 Human immunodeficiency virus [HIV] disease: Secondary | ICD-10-CM | POA: Diagnosis present

## 2023-03-03 DIAGNOSIS — E118 Type 2 diabetes mellitus with unspecified complications: Secondary | ICD-10-CM

## 2023-03-03 DIAGNOSIS — Z21 Asymptomatic human immunodeficiency virus [HIV] infection status: Secondary | ICD-10-CM

## 2023-03-03 DIAGNOSIS — I2510 Atherosclerotic heart disease of native coronary artery without angina pectoris: Secondary | ICD-10-CM

## 2023-03-03 DIAGNOSIS — I200 Unstable angina: Secondary | ICD-10-CM

## 2023-03-03 DIAGNOSIS — E785 Hyperlipidemia, unspecified: Secondary | ICD-10-CM | POA: Diagnosis present

## 2023-03-03 DIAGNOSIS — I25119 Atherosclerotic heart disease of native coronary artery with unspecified angina pectoris: Secondary | ICD-10-CM | POA: Diagnosis not present

## 2023-03-03 DIAGNOSIS — Z01810 Encounter for preprocedural cardiovascular examination: Secondary | ICD-10-CM

## 2023-03-03 DIAGNOSIS — Z79899 Other long term (current) drug therapy: Secondary | ICD-10-CM | POA: Diagnosis present

## 2023-03-03 NOTE — Patient Instructions (Addendum)
Medication Instructions:   STOP ASPIRIN   START: ROSUVASTATIN '20mg'$  (1)  WHOLE TABLET DAILY   CONTINUE PLAVIX (CLOPIDOGREL)  *If you need a refill on your cardiac medications before your next appointment, please call your pharmacy*  Follow-Up: At [facility], you and your health needs are our priority.  As part of our continuing mission to provide you with exceptional heart care, we have created designated Provider Care Teams.  These Care Teams include your primary Cardiologist (physician) and Advanced Practice Providers (APPs -  Physician Assistants and Nurse Practitioners) who all work together to provide you with the care you need, when you need it.  Your next appointment:   6 month(s)  Provider:   Elouise Munroe, MD   OR APP

## 2023-03-03 NOTE — Progress Notes (Signed)
Cardiology Office Note:    Date:  03/03/2023  ID:  EERO HEIMAN, DOB June 15, 1951, MRN [id number]  PCP:  [name], [name], MD  Cardiologist:  Elouise Munroe, MD  Electrophysiologist:  None   Referring MD: [name], [name], MD   Chief Complaint: CAD, hypotension and tachycardia, Pre-Op optimization needed  History of Present Illness:    Brian Malone is a 72 y.o. male with a history of diabetes, hypertension, HIV, hyperlipidemia, chronic kidney disease stage III, hepatitis C treated.  CT coronary angiography demonstrated moderate CAD with indeterminate FFR values who is here for follow up.  He initially came to clinic for risk stratification for debridement surgeries. At the time, his HIV was well controlled. At his last visit he complained of chest discomfort during PTSD episodes and strenuous exercise. This does not stop him from running, and he often does 5ks. He feels his symptoms are not lifestyle limiting. He had been started on a statin. He subjectively reports inflammatory joint disease which is exacerbated by Asprin. He has had his joints aspirates several times, but no crystals have been found. Unclear what this reaction is but he has informed me in the past he categorically cannot take lifelong aspirin. This, and his prior trauma leading to a subdural hematoma have lead to many conversations about challenges pursuing cath for progressive symptoms, and we have pursued aggressive medical therapy as tolerated.   He has since underwent cardiac cath with severe three-vessel CAD, he received stents to the mid and distal LAD and had no complications. Feels much better after.  Today, the patient states that he has been doing well post stent and has noticed no angina. He has been able to increase his activity and has no exertional limitations.   He hasn't had any issues with aspirin aside from one joint issue recently. He has been compliant with his medication and Plavix is  well tolerated. He has been taking a half tablet of rosuvastatin (10 mg) due to pharmacy instructions which we discussed. Given CAD we will increase to 20 mg daily. When he misses a dose of propanolol he notices tachycardia and anxiety.  His platelet count has been low recently, 132k. It has been borderline historically. Discussed stopping ASA today as per cath instructions.  He denies any chest pain, shortness of breath, or peripheral edema. No lightheadedness, headaches, syncope, orthopnea, or PND.   Past Medical History:  Diagnosis Date   Acute subdural hematoma ([place]) 10/28/2019   Anxiety    Arthritis    Cancer (HCC)    basal cell on nose   Cellulitis of right upper extremity 11/04/2020   Depression    Diabetes mellitus without complication (HCC)    Dysphagia    GERD (gastroesophageal reflux disease)    Hepatitis C    High cholesterol    History of COVID-19 05/26/2020   History of kidney stones    HIV (human immunodeficiency virus infection) ([place])    Hypertension    Pneumonia    Renal disorder    Tachycardia     Past Surgical History:  Procedure Laterality Date   ANKLE ARTHROSCOPY     APPENDECTOMY     APPLICATION OF WOUND VAC Right 03/25/2021   Procedure: APPLICATION OF WOUND VAC;  Surgeon: Lajuana Matte, MD;  Location: [facility];  Service: Thoracic;  Laterality: Right;   BACK SURGERY     FUSION   CORONARY STENT INTERVENTION N/A 10/26/2022   Procedure: CORONARY STENT INTERVENTION;  Surgeon:  Leonie Man, MD;  Location: [facility];  Service: Cardiovascular;  Laterality: N/A;   DEBRIDEMENT AND CLOSURE WOUND Right 05/14/2021   Procedure: Right chest wound excision;  Surgeon: Wallace Going, DO;  Location: [facility];  Service: Plastics;  Laterality: Right;   INCISION AND DRAINAGE Right 03/25/2021   Procedure: INCISION AND DRAINAGE OF CHEST WALL;  Surgeon: Lajuana Matte, MD;  Location: [facility];  Service: Thoracic;  Laterality: Right;   INCISION AND DRAINAGE  ABSCESS Right 03/19/2021   Procedure: INCISION AND DRAINAGE RIGHT CHEST ABSCESS;  Surgeon: Lajuana Matte, MD;  Location: [facility];  Service: Vascular;  Laterality: Right;   IR US GUIDE BX ASP/DRAIN  03/14/2021   IR US GUIDE BX ASP/DRAIN  03/14/2021   LEFT HEART CATH AND CORONARY ANGIOGRAPHY N/A 10/26/2022   Procedure: LEFT HEART CATH AND CORONARY ANGIOGRAPHY;  Surgeon: Leonie Man, MD;  Location: [facility];  Service: Cardiovascular;  Laterality: N/A;   PENILE PROSTHESIS  REMOVAL  05/2020   Removal due to abscess   PENILE PROSTHESIS PLACEMENT  04/2020   SKIN SPLIT GRAFT Right 05/14/2021   Procedure: possible skin graft and Matriderm;  Surgeon: Wallace Going, DO;  Location: [facility];  Service: Plastics;  Laterality: Right;   SKIN SPLIT GRAFT Right 06/24/2021   Procedure: SKIN GRAFT SPLIT THICKNESS TO RIGHT CHEST;  Surgeon: Wallace Going, DO;  Location: [facility];  Service: Plastics;  Laterality: Right;   THORACOTOMY Right 2008  ?   TONSILLECTOMY      Current Medications: Current Meds  Medication Sig   acetaminophen (TYLENOL) 325 MG tablet Take 650 mg by mouth every 6 (six) hours as needed for mild pain.   buprenorphine (BUTRANS) 20 MCG/HR PTWK Place 1 patch onto the skin once a week.   busPIRone (BUSPAR) 30 MG tablet Take 10-30 mg by mouth See admin instructions. Take 10 mg in the morning and 30 mg at bedtime   Carboxymethylcellul-Glycerin (LUBRICATING EYE DROPS OP) Place 1 drop into both eyes daily as needed (dry eyes).   clonazePAM (KLONOPIN) 0.5 MG tablet Take 0.5 mg by mouth in the morning, at noon, and at bedtime. Adjust with anxiety level   clopidogrel (PLAVIX) 75 MG tablet Take 1 tablet (75 mg total) by mouth daily.   docusate sodium (COLACE) 250 MG capsule Take 250 mg by mouth in the morning and at bedtime.   Ensure Max Protein (ENSURE MAX PROTEIN) LIQD Take 11 oz by mouth 3 (three) times daily.   ezetimibe (ZETIA) 10 MG tablet Take 10 mg by mouth every morning.    isosorbide mononitrate (IMDUR) 60 MG 24 hr tablet TAKE 1 TABLET(60 MG) BY MOUTH DAILY   loratadine (CLARITIN) 10 MG tablet Take 10 mg by mouth every evening.   nitroGLYCERIN (NITROSTAT) 0.4 MG SL tablet Place 1 tablet (0.4 mg total) under the tongue every 5 (five) minutes as needed.   ondansetron (ZOFRAN-ODT) 8 MG disintegrating tablet Take 8 mg by mouth every 8 (eight) hours as needed for nausea or vomiting.   pantoprazole (PROTONIX) 40 MG tablet Take 40 mg by mouth daily before breakfast.    polyethylene glycol (MIRALAX / GLYCOLAX) 17 g packet Take 17 g by mouth daily. Capful   Polyvinyl Alcohol (LUBRICANT DROPS OP) Place 1 drop into both eyes daily as needed (dry eye).   propranolol ER (INDERAL LA) 60 MG 24 hr capsule Take 60 mg by mouth every evening.   rosuvastatin (CRESTOR) 20 MG  tablet Take 1 tablet (20 mg total) by mouth at bedtime.   Semaglutide,0.25 or 0.'5MG'$ /DOS, 2 MG/1.5ML SOPN Inject 0.5 mg into the skin once a week.   tamsulosin (FLOMAX) 0.4 MG CAPS capsule Take 0.4 mg by mouth at bedtime.    tiZANidine (ZANAFLEX) 4 MG tablet Take 2-4 mg by mouth at bedtime.   [DISCONTINUED] aspirin EC 81 MG tablet Take 1 tablet (81 mg total) by mouth daily. Swallow whole.     Allergies:   Adhesive [tape], Cyclobenzaprine, Duloxetine hcl, Morphine, Zyvox [linezolid], and Pregabalin   Social History   Socioeconomic History   Marital status: Divorced    Spouse name: Not on file   Number of children: 2   Years of education: Not on file   Highest education level: Bachelor's degree (e.g., BA, AB, BS)  Occupational History   Not on file  Tobacco Use   Smoking status: Never   Smokeless tobacco: Never  Vaping Use   Vaping Use: Never used  Substance and Sexual Activity   Alcohol use: Not Currently   Drug use: Never   Sexual activity: Not on file  Other Topics Concern   Not on file  Social History Narrative   Pt is divorced   2 children   Right handed   Drinks soda, no coffee, no tea     Lives on the third floor of an apartment bldg. One story studio apartment   Social Determinants of Health   Financial Resource Strain: Not on file  Food Insecurity: Not on file  Transportation Needs: Not on file  Physical Activity: Not on file  Stress: Not on file  Social Connections: Not on file     Family History: The patient's family history includes CAD in his brother, father, and mother; Lung cancer in his father and mother.  ROS:   Please see the history of present illness.     All other systems reviewed and are negative.  EKGs/Labs/Other Studies Reviewed:    The following studies were reviewed today:  CT Coronary Fractional Flow [place] 11/23/19:  1. CT FFR analysis showed severe stenosis in the mid to distal LAD, ostial portion of OM2 and ostial portion of the acute marginal branch. These vessels are too small for intervention. Aggressive risk factor modification, anginal therapy and abstinence from drugs are recommended.  Echo 11/23/2019: 1. Left ventricular ejection fraction, by visual estimation, is 50 to  55%. The left ventricle has normal function. There is no left ventricular  hypertrophy.   2. Global right ventricle has normal systolic function.The right  ventricular size is normal. No increase in right ventricular wall  thickness.   3. Left atrial size was normal.   4. Right atrial size was mildly dilated.   5. The mitral valve is normal in structure. No evidence of mitral valve  regurgitation.   6. The tricuspid valve is normal in structure. Tricuspid valve  regurgitation is not demonstrated.   7. The aortic valve is grossly normal. Aortic valve regurgitation is  mild.   8. The pulmonic valve was normal in structure. Pulmonic valve  regurgitation is not visualized.   9. The atrial septum is grossly normal.   Vas [place] ABI 05/15/2019: Right: Resting right ankle-brachial index is within normal range. No  evidence of significant right lower extremity  arterial disease.   Left: Resting left ankle-brachial index is within normal range. No evidence of significant left lower extremity arterial disease.   EKG:  The EKG is personally reviewed 03/03/2023;  The EKG was not ordered today. 05/08/2021: NSR, rate 82 bpm 05/30/2020: NSR  Recent Labs: 10/22/2022: BUN 40; Creatinine, Ser 1.35; Hemoglobin 12.1; Platelets 153; Potassium 4.8; Sodium 139  Recent Lipid Panel    Component Value Date/Time   CHOL 149 11/22/2019 2113   TRIG 133 02/12/2020 1928   HDL 25 (L) 11/22/2019 2113   CHOLHDL 6.0 11/22/2019 2113   VLDL 77 (H) 11/22/2019 2113   LDLCALC 47 11/22/2019 2113    Physical Exam:    VS:  BP 110/62   Pulse 61   Ht 6' (1.829 m)   Wt 177 lb 9.6 oz (80.6 kg)   SpO2 97%   BMI 24.09 kg/m     Wt Readings from Last 5 Encounters:  03/03/23 177 lb 9.6 oz (80.6 kg)  11/09/22 181 lb 3.2 oz (82.2 kg)  10/26/22 179 lb (81.2 kg)  10/22/22 179 lb 9.6 oz (81.5 kg)  10/01/22 180 lb 9.6 oz (81.9 kg)    Constitutional: No acute distress Eyes: sclera non-icteric, normal conjunctiva and lids ENMT: normal dentition, moist mucous membranes Cardiovascular: regular rhythm, normal rate, no murmurs. S1 and S2 normal. No jugular venous distention.  Respiratory: clear to auscultation bilaterally GI : normal bowel sounds, soft and nontender. No distention.   MSK: extremities warm, well perfused. No edema.  NEURO: grossly nonfocal exam, moves all extremities. PSYCH: alert and oriented x 3, normal mood and affect.   ASSESSMENT:    1. Essential hypertension   2. Coronary artery disease involving native coronary artery of native heart without angina pectoris   3. Hyperlipidemia LDL goal <70   4. Controlled type 2 diabetes mellitus with complication, without long-term current use of insulin ([place])   5. Progressive angina ([place])   6. HIV infection, unspecified symptom status ([place])   7. Preop cardiovascular exam   8. S/P drug eluting coronary stent placement       PLAN:    Progressive angina Coronary artery disease involving native coronary artery of native heart with angina pectoris S/p PCI Medication management -Given borderline platelet count and previous intolerance of aspirin, we will discontinue aspirin today.  Plavix is to be continued indefinitely given prior PCI. -No recurrent angina.  Exercising without limitation.  Continue Imdur 60 mg daily.  Patient is on beta-blockade with propranolol per patient preference, continue. -Takes rosuvastatin but has been taking 10 mg daily.  Lipids are reasonably well-controlled, however for secondary prevention of CAD recommend 20 mg daily.  He will try this and will report to his primary care physician any side effects next week at their visit.  Essential hypertension-blood pressures well controlled today, continue propranolol.  He uses propranolol which also helps with his anxiety. Of note, this choice of beta blockade was due to coverage options at the time of initiation and patient preferred to stay on this therapy.  He feels well with this therapy and blood pressure is stable.  Hyperlipidemia, unspecified hyperlipidemia type -Currently on Zetia, LDL was 47 at last check. Now also on statin with permissible LFTs initiated by PCP.  Increased to 20 mg of Crestor.  Preoperative cardiovascular risk assessment -Discussed preoperative counseling in detail.  We discussed cardiovascular risk stratification and patient understands and is in agreement with the proposed risk stratification.  He understands his risk level and is willing to proceed as scheduled. - The patient is intermediate risk for general anesthesia.  No further cardiovascular testing is required prior to the procedure.  If this level of risk is  acceptable to the patient and surgical team, the patient should be considered optimized from a cardiovascular standpoint.  Risk level is nonmodifiable and contributed to by multivessel CAD, hypertension,  hyperlipidemia, borderline thrombocytopenia. -After 6 months of antiplatelet therapy, which will be on or after Apr 26, 2023, the patient may hold his Plavix for 5 days preceding penile implant surgery.  He should resume Plavix as soon as safe from a postsurgical standpoint.  Total time of encounter: 40 minutes total time of encounter, including 25 minutes spent in face-to-face patient care on the date of this encounter. This time includes coordination of care and counseling regarding above mentioned problem list. Remainder of non-face-to-face time involved reviewing chart documents/testing relevant to the patient encounter and documentation in the medical record. I have independently reviewed documentation from referring provider.   Cherlynn Kaiser, MD, [facility]     Medication Adjustments/Labs and Tests Ordered: Current medicines are reviewed at length with the patient today.  Concerns regarding medicines are outlined above.  No orders of the defined types were placed in this encounter.  No orders of the defined types were placed in this encounter.   Patient Instructions  Medication Instructions:   STOP ASPIRIN   START: ROSUVASTATIN '20mg'$  (1)  WHOLE TABLET DAILY   CONTINUE PLAVIX (CLOPIDOGREL)  *If you need a refill on your cardiac medications before your next appointment, please call your pharmacy*  Follow-Up: At [facility], you and your health needs are our priority.  As part of our continuing mission to provide you with exceptional heart care, we have created designated Provider Care Teams.  These Care Teams include your primary Cardiologist (physician) and Advanced Practice Providers (APPs -  Physician Assistants and Nurse Practitioners) who all work together to provide you with the care you need, when you need it.  Your next appointment:   6 month(s)  Provider:   Elouise Munroe, MD   OR APP     I,Coren O'Brien,acting as a scribe for  Elouise Munroe, MD.,have documented all relevant documentation on the behalf of Elouise Munroe, MD,as directed by  Elouise Munroe, MD while in the presence of Elouise Munroe, MD.  I, Elouise Munroe, MD, have reviewed all documentation for this visit. The documentation on 03/03/23 for the exam, diagnosis, procedures, and orders are all accurate and complete.

## 2023-05-18 ENCOUNTER — Telehealth: Payer: Self-pay | Admitting: *Deleted

## 2023-05-18 NOTE — Telephone Encounter (Signed)
   Patient Name: Brian Malone  DOB: 04/15/1951 MRN: [id number]  Primary Cardiologist: Parke Poisson, MD  Chart reviewed as part of pre-operative protocol coverage.   Please see comment per Dr. Rudene Anda below regarding requested hold time for Plavix.  This is not the optimal scenario. I would offer to the patient that if a 10 day hold is required, he may want to wait until 1 year after stent implant for surgery. There is a higher risk off of antiplatelet therapy with stent placement, the longer the duration of the hold. Please ensure patient understands risk of holding antiplatelets. If he is adamant about proceeding with current surgical date, his risk categorization may increase slightly.  Hope this helps, I'm not aware of data to guide on 5 vs 10 day hold.    [name], Leodis Rains, NP 05/18/2023, 12:21 PM

## 2023-05-18 NOTE — Telephone Encounter (Signed)
   Pre-operative Risk Assessment    Patient Name: Brian Malone  DOB: 1951/11/23 MRN: [id number]      Request for Surgical Clearance    Procedure:  IPP (PENILE IMPLANT)  Date of Surgery:  Clearance TBD                                 Surgeon:  Ollen Bowl, MD Surgeon's Group or Practice Name:  [facility] Phone number:  [phone] Fax number:  [phone]   Type of Clearance Requested:   - Pharmacy:  Hold Clopidogrel (Plavix) This is from the clearance request:   We require the patient to hve a platelet count of 150,000, that have not been effected by antiplatelet medicatins prior to elective surgery.  This patient's baseline platelet count is exactly 150,000.  Please let us know when you feel he would be able to hold his Plavix for a full 10 day pre-operatively and this would keep him at intermediate risk for surgery?   Type of Anesthesia:  Not Indicated   Additional requests/questions:    Signed, Elliot Cousin   05/18/2023, 11:42 AM

## 2023-07-06 ENCOUNTER — Encounter: Payer: Self-pay | Admitting: Podiatry

## 2023-07-06 ENCOUNTER — Ambulatory Visit: Payer: Medicare Other

## 2023-07-06 ENCOUNTER — Ambulatory Visit (INDEPENDENT_AMBULATORY_CARE_PROVIDER_SITE_OTHER): Payer: Medicare Other | Admitting: Podiatry

## 2023-07-06 DIAGNOSIS — M7742 Metatarsalgia, left foot: Secondary | ICD-10-CM

## 2023-07-06 DIAGNOSIS — M7741 Metatarsalgia, right foot: Secondary | ICD-10-CM | POA: Diagnosis not present

## 2023-07-06 DIAGNOSIS — E119 Type 2 diabetes mellitus without complications: Secondary | ICD-10-CM | POA: Diagnosis not present

## 2023-07-06 DIAGNOSIS — Q667 Congenital pes cavus, unspecified foot: Secondary | ICD-10-CM

## 2023-07-06 NOTE — Progress Notes (Signed)
   Chief Complaint  Patient presents with   Foot Pain    Pt came in for a yearly foot exam. He  also stated that his feet hurt when he walks.     Subjective:  72 y.o. male presenting today for annual diabetic foot exam.  Patient states that he does have pain and achiness almost on a daily basis secondary to his chronic peripheral polyneuropathy.  He says that his diabetes is well-controlled.  Last A1c 03/08/2023 was 5.8.  He wears his diabetic shoes with Plastizote custom molded insoles daily.  Presenting for routine annual foot exam   Past Medical History:  Diagnosis Date   Acute subdural hematoma (HCC) 10/28/2019   Anxiety    Arthritis    Cancer (HCC)    basal cell on nose   Cellulitis of right upper extremity 11/04/2020   Depression    Diabetes mellitus without complication (HCC)    Dysphagia    GERD (gastroesophageal reflux disease)    Hepatitis C    High cholesterol    History of COVID-19 05/26/2020   History of kidney stones    HIV (human immunodeficiency virus infection) (HCC)    Hypertension    Pneumonia    Renal disorder    Tachycardia      Objective / Physical Exam:  General:  The patient is alert and oriented x3 in no acute distress. Dermatology:  Skin is warm, dry and supple bilateral lower extremities. Negative for open lesions or macerations. Vascular:  Palpable pedal pulses bilaterally. No edema or erythema noted. Capillary refill within normal limits.  Clinically there is no concern for vascular compromise Neurological:  Light touch and protective threshold diminished bilateral Musculoskeletal Exam:  Muscle strength 5/5 all compartments.  ROM WNL.  High arches noted which likely contribute to the patient's chronic ankle pain and midfoot pain.  There is also some pain and tenderness associated to the bilateral forefoot likely with high arches being the underlying cause   Assessment: 1.  Encounter for diabetic foot exam 2.  Chronic foot pain bilateral.   Metatarsalgia bilateral 3.  High arches bilateral  Plan of Care:  -Patient was evaluated.  Comprehensive diabetic foot exam was performed today -Order placed for new custom molded Plastizote insoles with diabetic shoes -Maintain close management with PCP for diabetes.  Currently well-controlled -Return to clinic annually  Felecia Shelling, DPM [facility]  Dr. Felecia Shelling, DPM    [address]                                        [place], [place] [address]                Office [phone]  Fax [phone]

## 2023-07-06 NOTE — Progress Notes (Signed)
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 Inserts needed to provide total contact to Cavus arch deformity helping to greater reduce MT pressure, reduce friction and sheering that will reduce callous and chance of developing ulcer Items to be ordered and fit when in - Brian Malone C.Ped, CFo, CFm.

## 2023-09-15 ENCOUNTER — Ambulatory Visit: Payer: Medicare Other | Attending: Internal Medicine | Admitting: Internal Medicine

## 2023-09-15 ENCOUNTER — Encounter: Payer: Self-pay | Admitting: Internal Medicine

## 2023-09-15 VITALS — BP 126/78 | HR 65 | Ht 72.0 in | Wt 187.2 lb

## 2023-09-15 DIAGNOSIS — I10 Essential (primary) hypertension: Secondary | ICD-10-CM | POA: Diagnosis not present

## 2023-09-15 NOTE — Progress Notes (Signed)
Cardiology Office Note:  .   Date:  09/15/2023  ID:  Brian Malone, DOB 1951-04-06, MRN [id number] PCP: Randel Pigg, Dorma Russell, MD  [facility] Providers Cardiologist:  Parke Poisson, MD    History of Present Illness: .   Brian Malone is a 72 y.o. male.  Discussed the use of AI scribe software for clinical note transcription with the patient, who gave verbal consent to proceed.  History of Present Illness   The patient, with a history of diabetes, hypertension, HIV, hyperlipidemia, chronic kidney disease stage 3, treated hepatitis C, and coronary artery disease, presents for a routine follow-up. The patient had previously undergone coronary CTA and cardiac catheterization with stent placement in the mid and distal LAD. The patient is currently on Plavix, rosuvastatin, and other medications. The patient reports no residual symptoms and has been maintaining an active lifestyle with regular exercise. The patient's labs show slightly worsened kidney function (Cr 1.58). The patient's cholesterol levels are well controlled. The patient's blood pressure is 126/78. The patient's EKG is normal.       ROS: negative except per HPI above.  Studies Reviewed: Marland Kitchen   EKG Interpretation Date/Time:  Thursday September 15 2023 13:32:12 EDT Ventricular Rate:  65 PR Interval:  146 QRS Duration:  90 QT Interval:  404 QTC Calculation: 420 R Axis:   24  Text Interpretation: Normal sinus rhythm Normal ECG When compared with ECG of 26-Oct-2022 13:35, No significant change was found Confirmed by Weston Brass ([id number]) on 09/15/2023 1:50:03 PM    Results   LABS PLT: 132,000 (08/2023) Total cholesterol: 119 (08/2023) Triglycerides: 84 (08/2023) HDL: 44 (08/2023) LDL: 59 (08/2023)  DIAGNOSTIC Cardiac catheterization: Stents placed in mid and distal LAD EKG: Normal (09/15/2023)     Risk Assessment/Calculations:             Physical Exam:   VS:  BP 126/78 (BP Location: Right Arm,  Patient Position: Sitting, Cuff Size: Normal)   Pulse 65   Ht 6' (1.829 m)   Wt 187 lb 3.2 oz (84.9 kg)   SpO2 98%   BMI 25.39 kg/m    Wt Readings from Last 3 Encounters:  09/15/23 187 lb 3.2 oz (84.9 kg)  03/03/23 177 lb 9.6 oz (80.6 kg)  11/09/22 181 lb 3.2 oz (82.2 kg)     Physical Exam   VITALS: BP- 126/78 CHEST: Lungs clear to auscultation. CARDIOVASCULAR: Heart sounds normal with regular rate and rhythm. No murmurs.     GEN: Well nourished, well developed in no acute distress NECK: No JVD; No carotid bruits CARDIAC: RRR, no murmurs, rubs, gallops RESPIRATORY:  Clear to auscultation without rales, wheezing or rhonchi  ABDOMEN: Soft, non-tender, non-distended EXTREMITIES:  No edema; No deformity   ASSESSMENT AND PLAN: .    Assessment and Plan    Coronary Artery Disease Preop risk stratification Post-PCI with stents to mid and distal LAD. Asymptomatic with good exercise tolerance. Currently on Plavix.  Secondary prevention with statin and BB.  -Continue current management. - Surgeon recommends holding Plavix for 10 days prior to penile implant surgery in November 2024. We participated in shared decision making, acknowledging the low but not zero risk to stent off of all antiplatelet agents. He will discuss with his surgeon. Consider ASA 81 mg daily in perioperative period if permissible to surgical team.  - The patient is intermediate risk for intermediate risk procedure.  No further cardiovascular testing is required prior to the procedure.  If this level  of risk is acceptable to the patient and surgical team, the patient should be considered optimized from a cardiovascular standpoint.  Hyperlipidemia LDL 59. Currently on Rosuvastatin 20mg  daily and Zetia. -Continue current management.  Hypertension Well-controlled on current regimen including Propranolol 60mg  daily and Imdur. -Trial discontinuation of Imdur, monitor for any increase in chest pain. per patient  preference to reduce polypharmacy.  Chronic Kidney Disease Recent worsening of kidney function. Currently under investigation with primary care provider. -Encourage hydration and retesting in two weeks. -Discuss potential impact of long-term Pantoprazole use with primary care provider.  Diabetes Mellitus Recent increase in Hemoglobin A1c. Currently on Semaglutide. -Continue current management. -Encourage adherence to dietary modifications.  Follow-up in 1 year or sooner if any issues arise.         Signed, Parke Poisson, MD

## 2023-09-15 NOTE — Patient Instructions (Signed)
Medication Instructions:  Your physician has recommended you make the following change in your medication:   -Ok to hold isosorbide mononitrate (imdur) at patient preference.  *If you need a refill on your cardiac medications before your next appointment, please call your pharmacy*   Follow-Up: At [facility], you and your health needs are our priority.  As part of our continuing mission to provide you with exceptional heart care, we have created designated Provider Care Teams.  These Care Teams include your primary Cardiologist (physician) and Advanced Practice Providers (APPs -  Physician Assistants and Nurse Practitioners) who all work together to provide you with the care you need, when you need it.  We recommend signing up for the patient portal called "MyChart".  Sign up information is provided on this After Visit Summary.  MyChart is used to connect with patients for Virtual Visits (Telemedicine).  Patients are able to view lab/test results, encounter notes, upcoming appointments, etc.  Non-urgent messages can be sent to your provider as well.   To learn more about what you can do with MyChart, go to [email or web address].    Your next appointment:   12 month(s)  Provider:   Parke Poisson, MD  or Micah Flesher, PA-C, Juanda Crumble, PA-C, Azalee Course, PA-C, or Bernadene Person, NP

## 2023-09-17 NOTE — Progress Notes (Signed)
Seen by casting department Dr. Logan Bores patient

## 2023-09-17 NOTE — Progress Notes (Signed)
Seen by casting department

## 2023-11-16 ENCOUNTER — Other Ambulatory Visit: Payer: Self-pay | Admitting: Cardiology

## 2023-11-22 NOTE — Telephone Encounter (Signed)
Refill request for review. Filled by Laverda Page, NP. Are we managing med or her.

## 2023-11-22 NOTE — Telephone Encounter (Signed)
Pt last seen by Dr. Jacques Navy on 09/15/23. Per office note pt will continue on plavix. Refill is appropriate. Refill sent.

## 2023-11-23 ENCOUNTER — Telehealth: Payer: Self-pay

## 2023-11-23 ENCOUNTER — Ambulatory Visit: Payer: Medicare Other | Admitting: Plastic Surgery

## 2023-11-23 DIAGNOSIS — L989 Disorder of the skin and subcutaneous tissue, unspecified: Secondary | ICD-10-CM

## 2023-11-23 NOTE — Telephone Encounter (Signed)
Faxed surgical clearance form to Dr. Lupe Carney office for cardiac clearance to hold Plavix for upcoming procedure. (Date TBD) Fax: [phone]  Confirmation of receipt received.

## 2023-11-23 NOTE — Progress Notes (Signed)
Referring Provider Randel Pigg, Dorma Russell, MD [address] [place],  [place] [address]   CC:  Chief Complaint  Patient presents with   Advice Only   Skin Problem      Brian Malone is an 72 y.o. male.  HPI: Mr. Prasse is a 72 year old male who presents today for evaluation of several facial skin lesions.  Patient had a benign skin lesion removed in the office approximately 2 years ago but had a small skin lesion similar to the one that he is most concerned about on his nose which ultimately was diagnosed as a skin cancer and required extensive Mohs excision.  He states that the skin lesion that he is most concerned about has been present for approximately 7 to 8 months.  He said that it grows in size and he generally shaves it off to keep it flat but the skin.  He has several other skin lesions most notably at the lateral aspect of his left eye within the chondral bowl of the left ear and 1 skin tag just below and posterior to the left ear.  The skin lesions in the ear and on the face have been a have been there for quite some time he believes in excess of a year.  He would like to have all these lesions removed or biopsied.  He also noted that there was a skin lesion in the region of the left clavicle however he was unable to find this today.  Of note the patient is on Plavix for a stent placed approximately 1 year ago.  Allergies  Allergen Reactions   Adhesive [Tape] Other (See Comments)    "Tape will take off my skin, as will Band-Aids"   Cyclobenzaprine Other (See Comments)    Hallucinations   Duloxetine Hcl Other (See Comments)    Makes PTSD- induced nightmares more vivid    Morphine Itching and Other (See Comments)    In IV form causes hallucinations, aggression, and makes patient altered also only.  Oral morphine is ok     Zyvox [Linezolid] Nausea And Vomiting    And headaches   Pregabalin Anxiety    Pt reports insomnia and worsening anxiety     Outpatient  Encounter Medications as of 11/23/2023  Medication Sig Note   acetaminophen (TYLENOL) 325 MG tablet Take 650 mg by mouth every 6 (six) hours as needed for mild pain.    buprenorphine (BUTRANS) 20 MCG/HR PTWK Place 1 patch onto the skin once a week.    busPIRone (BUSPAR) 30 MG tablet Take 10-30 mg by mouth See admin instructions. Take 10 mg in the morning and 30 mg at bedtime    Carboxymethylcellul-Glycerin (LUBRICATING EYE DROPS OP) Place 1 drop into both eyes daily as needed (dry eyes).    clonazePAM (KLONOPIN) 0.5 MG tablet Take 0.5 mg by mouth in the morning, at noon, and at bedtime. Adjust with anxiety level 11/04/2020: PMP: Narcotic score 460; Sedative score 501; Overdose risk 570; LF 10/17/2020 # 90/30 DS @ [facility]   clopidogrel (PLAVIX) 75 MG tablet TAKE 1 TABLET BY MOUTH DAILY    docusate sodium (COLACE) 250 MG capsule Take 250 mg by mouth in the morning and at bedtime.    Ensure Max Protein (ENSURE MAX PROTEIN) LIQD Take 11 oz by mouth 3 (three) times daily.    ezetimibe (ZETIA) 10 MG tablet Take 10 mg by mouth every morning.    loratadine (CLARITIN) 10 MG tablet Take 10 mg  by mouth every evening.    nitroGLYCERIN (NITROSTAT) 0.4 MG SL tablet Place 1 tablet (0.4 mg total) under the tongue every 5 (five) minutes as needed.    ondansetron (ZOFRAN-ODT) 8 MG disintegrating tablet Take 8 mg by mouth every 8 (eight) hours as needed for nausea or vomiting.    pantoprazole (PROTONIX) 40 MG tablet Take 40 mg by mouth daily before breakfast.     polyethylene glycol (MIRALAX / GLYCOLAX) 17 g packet Take 17 g by mouth daily. Capful    Polyvinyl Alcohol (LUBRICANT DROPS OP) Place 1 drop into both eyes daily as needed (dry eye).    propranolol ER (INDERAL LA) 60 MG 24 hr capsule Take 60 mg by mouth every evening.    rosuvastatin (CRESTOR) 20 MG tablet Take 1 tablet (20 mg total) by mouth at bedtime.    Semaglutide,0.25 or 0.5MG /DOS, 2 MG/1.5ML SOPN Inject 0.5 mg into the skin once a week. 06/24/2021:  Ozempic   tamsulosin (FLOMAX) 0.4 MG CAPS capsule Take 0.4 mg by mouth at bedtime.     tiZANidine (ZANAFLEX) 4 MG tablet Take 2-4 mg by mouth at bedtime.    TRIUMEQ 600-50-300 MG tablet Take 1 tablet by mouth daily.    No facility-administered encounter medications on file as of 11/23/2023.     Past Medical History:  Diagnosis Date   Acute subdural hematoma (HCC) 10/28/2019   Anxiety    Arthritis    Cancer (HCC)    basal cell on nose   Cellulitis of right upper extremity 11/04/2020   Depression    Diabetes mellitus without complication (HCC)    Dysphagia    GERD (gastroesophageal reflux disease)    Hepatitis C    High cholesterol    History of COVID-19 05/26/2020   History of kidney stones    HIV (human immunodeficiency virus infection) (HCC)    Hypertension    Pneumonia    Renal disorder    Tachycardia     Past Surgical History:  Procedure Laterality Date   ANKLE ARTHROSCOPY     APPENDECTOMY     APPLICATION OF WOUND VAC Right 03/25/2021   Procedure: APPLICATION OF WOUND VAC;  Surgeon: Corliss Skains, MD;  Location: [facility] OR;  Service: Thoracic;  Laterality: Right;   BACK SURGERY     FUSION   CORONARY STENT INTERVENTION N/A 10/26/2022   Procedure: CORONARY STENT INTERVENTION;  Surgeon: Marykay Lex, MD;  Location: [facility] INVASIVE CV LAB;  Service: Cardiovascular;  Laterality: N/A;   DEBRIDEMENT AND CLOSURE WOUND Right 05/14/2021   Procedure: Right chest wound excision;  Surgeon: Peggye Form, DO;  Location: [facility] OR;  Service: Plastics;  Laterality: Right;   INCISION AND DRAINAGE Right 03/25/2021   Procedure: INCISION AND DRAINAGE OF CHEST WALL;  Surgeon: Corliss Skains, MD;  Location: [facility] OR;  Service: Thoracic;  Laterality: Right;   INCISION AND DRAINAGE ABSCESS Right 03/19/2021   Procedure: INCISION AND DRAINAGE RIGHT CHEST ABSCESS;  Surgeon: Corliss Skains, MD;  Location: [facility] OR;  Service: Vascular;  Laterality: Right;   IR US GUIDE BX ASP/DRAIN  03/14/2021    IR US GUIDE BX ASP/DRAIN  03/14/2021   LEFT HEART CATH AND CORONARY ANGIOGRAPHY N/A 10/26/2022   Procedure: LEFT HEART CATH AND CORONARY ANGIOGRAPHY;  Surgeon: Marykay Lex, MD;  [facility];  Service: Cardiovascular;  Laterality: N/A;   PENILE PROSTHESIS  REMOVAL  05/2020   Removal due to abscess   PENILE PROSTHESIS PLACEMENT  04/2020   SKIN SPLIT GRAFT Right 05/14/2021   Procedure: possible skin graft and Matriderm;  Surgeon: Peggye Form, DO;  Location: [facility] OR;  Service: Plastics;  Laterality: Right;   SKIN SPLIT GRAFT Right 06/24/2021   Procedure: SKIN GRAFT SPLIT THICKNESS TO RIGHT CHEST;  Surgeon: Peggye Form, DO;  Location: [facility] OR;  Service: Plastics;  Laterality: Right;   THORACOTOMY Right 2008  ?   TONSILLECTOMY      Family History  Problem Relation Age of Onset   CAD Mother    Lung cancer Mother    CAD Father    Lung cancer Father    CAD Brother     Social History   Social History Narrative   Pt is divorced   2 children   Right handed   Drinks soda, no coffee, no tea    Lives on the third floor of an apartment bldg. One story studio apartment     Review of Systems General: Denies fevers, chills, weight loss CV: Denies chest pain, shortness of breath, palpitations Skin: Multiple skin lesions none of which are painful or infected.  Physical Exam    09/15/2023    1:22 PM 03/03/2023    1:20 PM 11/09/2022    8:45 AM  Vitals with BMI  Height 6\' 0"  6\' 0"  6\' 0"   Weight 187 lbs 3 oz 177 lbs 10 oz 181 lbs 3 oz  BMI 25.38 24.08 24.57  Systolic 126 110 440  Diastolic 78 62 68  Pulse 65 61 83    General:  No acute distress,  Alert and oriented, Non-Toxic, Normal speech and affect Integument multiple skin lesions on the left side of the face and upper neck as outlined above.    Assessment/Plan Skin lesions: Patient has 4 obvious skin lesions on the face which he would like to have removed or biopsied.  These include 1 on the left cheek 1  lateral to the left orbital rim 1 in the left chondral bowl and 1 behind the left ear.  We discussed excision of all of these lesions including the potential need for additional surgeries based on pathology.  The patient is on Plavix and especially with the lesion within the chondral bowl I am concerned about bleeding.  I hematoma in this area can cause significant damage to the auricular cartilage.  He is able to come off of the Plavix for surgery however I still believe that doing the excisions in the operating room are in his best interest.  All questions were answered to his satisfaction.  Photographs were obtained today with his consent.  Will submit him for excision of multiple skin lesions on the left face at his request.  Santiago Glad 11/23/2023, 1:25 PM

## 2023-11-24 ENCOUNTER — Telehealth: Payer: Self-pay

## 2023-11-24 ENCOUNTER — Telehealth: Payer: Self-pay | Admitting: *Deleted

## 2023-11-24 NOTE — Telephone Encounter (Signed)
  Patient Consent for Virtual Visit         Brian Malone has provided verbal consent on 11/24/2023 for a virtual visit (video or telephone).   CONSENT FOR VIRTUAL VISIT FOR:  Brian Malone  By participating in this virtual visit I agree to the following:  I hereby voluntarily request, consent and authorize [facility] and its employed or contracted physicians, physician assistants, nurse practitioners or other licensed health care professionals (the Practitioner), to provide me with telemedicine health care services (the "Services") as deemed necessary by the treating Practitioner. I acknowledge and consent to receive the Services by the Practitioner via telemedicine. I understand that the telemedicine visit will involve communicating with the Practitioner through live audiovisual communication technology and the disclosure of certain medical information by electronic transmission. I acknowledge that I have been given the opportunity to request an in-person assessment or other available alternative prior to the telemedicine visit and am voluntarily participating in the telemedicine visit.  I understand that I have the right to withhold or withdraw my consent to the use of telemedicine in the course of my care at any time, without affecting my right to future care or treatment, and that the Practitioner or I may terminate the telemedicine visit at any time. I understand that I have the right to inspect all information obtained and/or recorded in the course of the telemedicine visit and may receive copies of available information for a reasonable fee.  I understand that some of the potential risks of receiving the Services via telemedicine include:  Delay or interruption in medical evaluation due to technological equipment failure or disruption; Information transmitted may not be sufficient (e.g. poor resolution of images) to allow for appropriate medical decision making by the  Practitioner; and/or  In rare instances, security protocols could fail, causing a breach of personal health information.  Furthermore, I acknowledge that it is my responsibility to provide information about my medical history, conditions and care that is complete and accurate to the best of my ability. I acknowledge that Practitioner's advice, recommendations, and/or decision may be based on factors not within their control, such as incomplete or inaccurate data provided by me or distortions of diagnostic images or specimens that may result from electronic transmissions. I understand that the practice of medicine is not an exact science and that Practitioner makes no warranties or guarantees regarding treatment outcomes. I acknowledge that a copy of this consent can be made available to me via my patient portal [facility] MyChart), or I can request a printed copy by calling the office of [facility].    I understand that my insurance will be billed for this visit.   I have read or had this consent read to me. I understand the contents of this consent, which adequately explains the benefits and risks of the Services being provided via telemedicine.  I have been provided ample opportunity to ask questions regarding this consent and the Services and have had my questions answered to my satisfaction. I give my informed consent for the services to be provided through the use of telemedicine in my medical care

## 2023-11-24 NOTE — Telephone Encounter (Signed)
 Patient is agreeable with telehealth appt.

## 2023-11-24 NOTE — Telephone Encounter (Signed)
   Pre-operative Risk Assessment    Patient Name: Brian Malone  DOB: [birth year] MRN: [id number]   Last OV: Dr. Jacques Navy 09/15/2023 Upcoming OV: None  Request for Surgical Clearance    Procedure:   Excision of skin lesions: R cheek, L orbital rim, L ear, L upper neck.  Date of Surgery:  Clearance TBD                                 Surgeon:  Dr. Weyman Croon Surgeon's Group or Practice Name:  [facility] Phone number:  [phone] Fax number:  [phone]   Type of Clearance Requested:   - Medical  - Pharmacy:  Hold Clopidogrel (Plavix) 2 days prior, one day after procedure.    Type of Anesthesia:  General    Additional requests/questions:    Signed, Emmit Pomfret   11/24/2023, 11:13 AM

## 2023-11-24 NOTE — Telephone Encounter (Signed)
   Name: Brian Malone  DOB: 1951/05/22  MRN: [id number]  Primary Cardiologist: Parke Poisson, MD   Preoperative team, please contact this patient and set up a phone call appointment for further preoperative risk assessment. Please obtain consent and complete medication review. Thank you for your help.  I confirm that guidance regarding antiplatelet and oral anticoagulation therapy has been completed and, if necessary, noted below.  His Plavix may be held for 2 days prior to his procedure and 1 day after his procedures.  Throughout his perioperative period he should continue 81 mg aspirin daily.  When his Plavix may be resumed he may discontinue aspirin therapy.  I also confirmed the patient resides in the state of [place]. As per [facility] Medical Board telemedicine laws, the patient must reside in the state in which the provider is licensed.   Ronney Asters, NP 11/24/2023, 11:38 AM [facility]

## 2023-12-16 ENCOUNTER — Ambulatory Visit (INDEPENDENT_AMBULATORY_CARE_PROVIDER_SITE_OTHER): Payer: Medicare Other | Admitting: Physician Assistant

## 2023-12-16 VITALS — BP 158/82 | HR 73

## 2023-12-16 DIAGNOSIS — L989 Disorder of the skin and subcutaneous tissue, unspecified: Secondary | ICD-10-CM

## 2023-12-16 MED ORDER — ONDANSETRON 4 MG PO TBDP: 4.0000 mg | ORAL_TABLET | Freq: Three times a day (TID) | ORAL | 0 refills | Status: AC | PRN

## 2023-12-16 NOTE — H&P (View-Only) (Signed)
 Patient ID: Brian Malone, male    DOB: 22-Jul-1951, 72 y.o.   MRN: [id number]  Chief Complaint  Patient presents with   Pre-op Exam      ICD-10-CM   1. Skin lesion  L98.9        History of Present Illness: Brian Malone is a 72 y.o.  male  with a history of multiple facial skin lesions.  He presents for preoperative evaluation for upcoming procedure, excision of skin lesions from left cheek, left orbital rim, left ear, and left upper neck, scheduled for 01/10/2024 with Dr.  Ladona Ridgel .  The patient has not had problems with anesthesia aside from mild PONV.  He just recently had a urologic procedure performed approximately 5 weeks ago.  At that time, he was able to hold his Plavix for 10 full days prior to surgery, restarting the day after.  He did not bridge with aspirin.  Per note from his cardiologist 09/15/2023, "participated in shared decision making, acknowledging the low but not 0 risk to stent off all antiplatelet agents.  He will discuss with the surgeon.  Consider ASA 81 mg daily in perioperative period if permissible to surgical team.  Intermediate risk for intermediate risk procedure.  No further cardiovascular testing required."  Clearance requested per chart encounter 11/24/2023 was for Plavix being held 2 days prior and 1 day after procedure on the condition that ASA 81 mg is taken on the days that Plavix is held.  He has his preoperative appointment with cardiology scheduled for 01/02/2024.  Patient exercises regularly, averaging nearly 2 hours of moderate exercise daily without any anginal symptoms.  He denies any personal or family history of DVT/PE.  He is feeling well recovered from his recent urologic surgery.  He denies any history of cancer aside from [facility].  Discussed risks of this surgery including possible necessity for additional surgery if margins are positive.  He voices understanding and is agreeable to the plan.  Summary of Previous Visit: He was seen for consult  by Dr. Ladona Ridgel on 11/23/2023.  At that time, discussed excision of multiple skin lesions and for them to be sent to biopsy.  Discussed potential need for additional surgeries based on results of the pathology.  He was informed that he would need to come off the Plavix completely for surgery and that the excision should occur in the operating room.  Photographs were obtained and placed in chart.  Job: Retired.  PMH Significant for: CAD with history of PCI on Plavix indefinitely followed by Dr. Jacques Navy with [facility], history of skin cancer, HTN, HLD, DM with A1c most recently 6.2%, gout, MDD, subdural hematoma, HIV, hepatitis.   Past Medical History: Allergies: Allergies  Allergen Reactions   Adhesive [Tape] Other (See Comments)    "Tape will take off my skin, as will Band-Aids"   Cyclobenzaprine Other (See Comments)    Hallucinations   Duloxetine Hcl Other (See Comments)    Makes PTSD- induced nightmares more vivid    Morphine Itching and Other (See Comments)    In IV form causes hallucinations, aggression, and makes patient altered also only.  Oral morphine is ok     Zyvox [Linezolid] Nausea And Vomiting    And headaches   Pregabalin Anxiety    Pt reports insomnia and worsening anxiety     Current Medications:  Current Outpatient Medications:    acetaminophen (TYLENOL) 325 MG tablet, Take 650 mg by mouth every 6 (six) hours as  needed for mild pain., Disp: , Rfl:    busPIRone (BUSPAR) 30 MG tablet, Take 30 mg by mouth See admin instructions. Take 10 mg in the morning and 30 mg at bedtime, Disp: , Rfl:    Carboxymethylcellul-Glycerin (LUBRICATING EYE DROPS OP), Place 1 drop into both eyes daily as needed (dry eyes)., Disp: , Rfl:    clonazePAM (KLONOPIN) 0.5 MG tablet, Take 0.5 mg by mouth in the morning, at noon, and at bedtime. Adjust with anxiety level, Disp: , Rfl:    clopidogrel (PLAVIX) 75 MG tablet, TAKE 1 TABLET BY MOUTH DAILY, Disp: 90 tablet, Rfl: 3   docusate sodium  (COLACE) 250 MG capsule, Take 250 mg by mouth daily., Disp: , Rfl:    Ensure Max Protein (ENSURE MAX PROTEIN) LIQD, Take 11 oz by mouth daily., Disp: , Rfl:    ezetimibe (ZETIA) 10 MG tablet, Take 10 mg by mouth every morning., Disp: , Rfl:    loratadine (CLARITIN) 10 MG tablet, Take 10 mg by mouth every evening., Disp: , Rfl:    nitroGLYCERIN (NITROSTAT) 0.4 MG SL tablet, Place 1 tablet (0.4 mg total) under the tongue every 5 (five) minutes as needed., Disp: 25 tablet, Rfl: 2   ondansetron (ZOFRAN-ODT) 8 MG disintegrating tablet, Take 8 mg by mouth every 8 (eight) hours as needed for nausea or vomiting., Disp: , Rfl:    pantoprazole (PROTONIX) 40 MG tablet, Take 40 mg by mouth daily before breakfast. , Disp: , Rfl:    polyethylene glycol (MIRALAX / GLYCOLAX) 17 g packet, Take 17 g by mouth daily. Capful, Disp: , Rfl:    Polyvinyl Alcohol (LUBRICANT DROPS OP), Place 1 drop into both eyes daily as needed (dry eye)., Disp: , Rfl:    propranolol ER (INDERAL LA) 60 MG 24 hr capsule, Take 60 mg by mouth every evening., Disp: , Rfl:    rosuvastatin (CRESTOR) 20 MG tablet, Take 1 tablet (20 mg total) by mouth at bedtime., Disp: 90 tablet, Rfl: 1   Semaglutide,0.25 or 0.5MG /DOS, 2 MG/1.5ML SOPN, Inject 0.5 mg into the skin once a week., Disp: , Rfl:    tamsulosin (FLOMAX) 0.4 MG CAPS capsule, Take 0.4 mg by mouth at bedtime. , Disp: , Rfl:    TRIUMEQ 600-50-300 MG tablet, Take 1 tablet by mouth daily., Disp: , Rfl:    buprenorphine (BUTRANS) 20 MCG/HR PTWK, Place 1 patch onto the skin once a week. (Patient not taking: Reported on 11/24/2023), Disp: , Rfl:    tiZANidine (ZANAFLEX) 4 MG tablet, Take 2-4 mg by mouth at bedtime. (Patient not taking: Reported on 11/24/2023), Disp: , Rfl:   Past Medical Problems: Past Medical History:  Diagnosis Date   Acute subdural hematoma (HCC) 10/28/2019   Anxiety    Arthritis    Cancer (HCC)    basal cell on nose   Cellulitis of right upper extremity 11/04/2020    Depression    Diabetes mellitus without complication (HCC)    Dysphagia    GERD (gastroesophageal reflux disease)    Hepatitis C    High cholesterol    History of COVID-19 05/26/2020   History of kidney stones    HIV (human immunodeficiency virus infection) (HCC)    Hypertension    Pneumonia    Renal disorder    Tachycardia     Past Surgical History: Past Surgical History:  Procedure Laterality Date   ANKLE ARTHROSCOPY     APPENDECTOMY     APPLICATION OF WOUND VAC Right 03/25/2021  Procedure: APPLICATION OF WOUND VAC;  Surgeon: Corliss Skains, MD;  Location: [facility] OR;  Service: Thoracic;  Laterality: Right;   BACK SURGERY     FUSION   CORONARY STENT INTERVENTION N/A 10/26/2022   Procedure: CORONARY STENT INTERVENTION;  Surgeon: Marykay Lex, MD;  Location: [facility] INVASIVE CV LAB;  Service: Cardiovascular;  Laterality: N/A;   DEBRIDEMENT AND CLOSURE WOUND Right 05/14/2021   Procedure: Right chest wound excision;  Surgeon: Peggye Form, DO;  Location: [facility] OR;  Service: Plastics;  Laterality: Right;   INCISION AND DRAINAGE Right 03/25/2021   Procedure: INCISION AND DRAINAGE OF CHEST WALL;  Surgeon: Corliss Skains, MD;  Location: [facility] OR;  Service: Thoracic;  Laterality: Right;   INCISION AND DRAINAGE ABSCESS Right 03/19/2021   Procedure: INCISION AND DRAINAGE RIGHT CHEST ABSCESS;  Surgeon: Corliss Skains, MD;  Location: [facility];  Service: Vascular;  Laterality: Right;   IR US GUIDE BX ASP/DRAIN  03/14/2021   IR US GUIDE BX ASP/DRAIN  03/14/2021   LEFT HEART CATH AND CORONARY ANGIOGRAPHY N/A 10/26/2022   Procedure: LEFT HEART CATH AND CORONARY ANGIOGRAPHY;  Surgeon: Marykay Lex, MD;  Location: [facility] INVASIVE CV LAB;  Service: Cardiovascular;  Laterality: N/A;   PENILE PROSTHESIS  REMOVAL  05/2020   Removal due to abscess   PENILE PROSTHESIS PLACEMENT  04/2020   SKIN SPLIT GRAFT Right 05/14/2021   Procedure: possible skin graft and Matriderm;  Surgeon: Peggye Form,  DO;  Location: [facility];  Service: Plastics;  Laterality: Right;   SKIN SPLIT GRAFT Right 06/24/2021   Procedure: SKIN GRAFT SPLIT THICKNESS TO RIGHT CHEST;  Surgeon: Peggye Form, DO;  Location: [facility];  Service: Plastics;  Laterality: Right;   THORACOTOMY Right 2008  ?   TONSILLECTOMY      Social History: Social History   Socioeconomic History   Marital status: Divorced    Spouse name: Not on file   Number of children: 2   Years of education: Not on file   Highest education level: Bachelor's degree (e.g., BA, AB, BS)  Occupational History   Not on file  Tobacco Use   Smoking status: Never   Smokeless tobacco: Never  Vaping Use   Vaping status: Never Used  Substance and Sexual Activity   Alcohol use: Not Currently   Drug use: Never   Sexual activity: Not on file  Other Topics Concern   Not on file  Social History Narrative   Pt is divorced   2 children   Right handed   Drinks soda, no coffee, no tea    Lives on the third floor of an apartment bldg. One story studio apartment   Social Drivers of Health   Financial Resource Strain: Not on file  Food Insecurity: Low Risk  (11/07/2023)   Received from [facility]   Hunger Vital Sign    Worried About Running Out of Food in the Last Year: Never true    Ran Out of Food in the Last Year: Never true  Transportation Needs: No Transportation Needs (11/07/2023)   Received from [employer]    In the past 12 months, has lack of reliable transportation kept you from medical appointments, meetings, work or from getting things needed for daily living? : No  Physical Activity: Not on file  Stress: Not on file  Social Connections: Not on file  Intimate Partner Violence: Not on file    Family History: Family History  Problem Relation Age of Onset   CAD Mother    Lung cancer Mother    CAD Father    Lung cancer Father    CAD Brother     Review of Systems: ROS Denies any recent chest pain, difficulty  breathing, leg swelling, fevers.  Physical Exam: Vital Signs BP (!) 158/82 (BP Location: Left Arm, Patient Position: Sitting, Cuff Size: Normal)   Pulse 73   SpO2 100%   Physical Exam  Constitutional:      General: Not in acute distress.    Appearance: Normal appearance. Not ill-appearing.  HENT:     Head: Normocephalic and atraumatic.  Eyes:     Pupils: Pupils are equal, round. Cardiovascular:     Rate and Rhythm: Normal rate.    Pulses: Normal pulses.  Pulmonary:     Effort: No respiratory distress or increased work of breathing.  Speaks in full sentences. Abdominal:     General: Abdomen is flat. No distension.   Musculoskeletal: Normal range of motion. No lower extremity swelling or edema. No varicosities. Skin:    General: Skin is warm and dry.     Findings: No erythema or rash.  Neurological:     Mental Status: Alert and oriented to person, place, and time.  Psychiatric:        Mood and Affect: Mood normal.        Behavior: Behavior normal.    Assessment/Plan: The patient is scheduled for excision of skin lesions from left cheek, left orbital rim, left ear, and left upper neck with Dr.  Ladona Ridgel .  Risks, benefits, and alternatives of procedure discussed, questions answered and consent obtained.    Smoking Status: Non-smoker.  Caprini Score: 4; Risk Factors include: Age, CAD, and length of planned surgery. Recommendation for mechanical prophylaxis.  Can restart his Plavix one day after surgery assuming hemostasis achieved at that time.  Encourage early ambulation.   Pictures obtained: 11/23/2023  Post-op Rx sent to pharmacy: Zofran. Will try to manage with Tylenol and consider oxycodone if needed due to post-operative pain.  Patient was provided with the General Surgical Risk consent document and Pain Medication Agreement prior to their appointment.  They had adequate time to read through the risk consent documents and Pain Medication Agreement. We also discussed them  in person together during this preop appointment. All of their questions were answered to their satisfaction.  Recommended calling if they have any further questions.  Risk consent form and Pain Medication Agreement to be scanned into patient's chart.    Electronically signed by: Evelena Leyden, PA-C 12/16/2023 3:24 PM

## 2023-12-16 NOTE — Progress Notes (Signed)
Patient ID: Brian Malone, male    DOB: 22-Jul-1951, 72 y.o.   MRN: [id number]  Chief Complaint  Patient presents with   Pre-op Exam      ICD-10-CM   1. Skin lesion  L98.9        History of Present Illness: Brian Malone is a 72 y.o.  male  with a history of multiple facial skin lesions.  He presents for preoperative evaluation for upcoming procedure, excision of skin lesions from left cheek, left orbital rim, left ear, and left upper neck, scheduled for 01/10/2024 with Dr.  Ladona Ridgel .  The patient has not had problems with anesthesia aside from mild PONV.  He just recently had a urologic procedure performed approximately 5 weeks ago.  At that time, he was able to hold his Plavix for 10 full days prior to surgery, restarting the day after.  He did not bridge with aspirin.  Per note from his cardiologist 09/15/2023, "participated in shared decision making, acknowledging the low but not 0 risk to stent off all antiplatelet agents.  He will discuss with the surgeon.  Consider ASA 81 mg daily in perioperative period if permissible to surgical team.  Intermediate risk for intermediate risk procedure.  No further cardiovascular testing required."  Clearance requested per chart encounter 11/24/2023 was for Plavix being held 2 days prior and 1 day after procedure on the condition that ASA 81 mg is taken on the days that Plavix is held.  He has his preoperative appointment with cardiology scheduled for 01/02/2024.  Patient exercises regularly, averaging nearly 2 hours of moderate exercise daily without any anginal symptoms.  He denies any personal or family history of DVT/PE.  He is feeling well recovered from his recent urologic surgery.  He denies any history of cancer aside from [facility].  Discussed risks of this surgery including possible necessity for additional surgery if margins are positive.  He voices understanding and is agreeable to the plan.  Summary of Previous Visit: He was seen for consult  by Dr. Ladona Ridgel on 11/23/2023.  At that time, discussed excision of multiple skin lesions and for them to be sent to biopsy.  Discussed potential need for additional surgeries based on results of the pathology.  He was informed that he would need to come off the Plavix completely for surgery and that the excision should occur in the operating room.  Photographs were obtained and placed in chart.  Job: Retired.  PMH Significant for: CAD with history of PCI on Plavix indefinitely followed by Dr. Jacques Navy with [facility], history of skin cancer, HTN, HLD, DM with A1c most recently 6.2%, gout, MDD, subdural hematoma, HIV, hepatitis.   Past Medical History: Allergies: Allergies  Allergen Reactions   Adhesive [Tape] Other (See Comments)    "Tape will take off my skin, as will Band-Aids"   Cyclobenzaprine Other (See Comments)    Hallucinations   Duloxetine Hcl Other (See Comments)    Makes PTSD- induced nightmares more vivid    Morphine Itching and Other (See Comments)    In IV form causes hallucinations, aggression, and makes patient altered also only.  Oral morphine is ok     Zyvox [Linezolid] Nausea And Vomiting    And headaches   Pregabalin Anxiety    Pt reports insomnia and worsening anxiety     Current Medications:  Current Outpatient Medications:    acetaminophen (TYLENOL) 325 MG tablet, Take 650 mg by mouth every 6 (six) hours as  needed for mild pain., Disp: , Rfl:    busPIRone (BUSPAR) 30 MG tablet, Take 30 mg by mouth See admin instructions. Take 10 mg in the morning and 30 mg at bedtime, Disp: , Rfl:    Carboxymethylcellul-Glycerin (LUBRICATING EYE DROPS OP), Place 1 drop into both eyes daily as needed (dry eyes)., Disp: , Rfl:    clonazePAM (KLONOPIN) 0.5 MG tablet, Take 0.5 mg by mouth in the morning, at noon, and at bedtime. Adjust with anxiety level, Disp: , Rfl:    clopidogrel (PLAVIX) 75 MG tablet, TAKE 1 TABLET BY MOUTH DAILY, Disp: 90 tablet, Rfl: 3   docusate sodium  (COLACE) 250 MG capsule, Take 250 mg by mouth daily., Disp: , Rfl:    Ensure Max Protein (ENSURE MAX PROTEIN) LIQD, Take 11 oz by mouth daily., Disp: , Rfl:    ezetimibe (ZETIA) 10 MG tablet, Take 10 mg by mouth every morning., Disp: , Rfl:    loratadine (CLARITIN) 10 MG tablet, Take 10 mg by mouth every evening., Disp: , Rfl:    nitroGLYCERIN (NITROSTAT) 0.4 MG SL tablet, Place 1 tablet (0.4 mg total) under the tongue every 5 (five) minutes as needed., Disp: 25 tablet, Rfl: 2   ondansetron (ZOFRAN-ODT) 8 MG disintegrating tablet, Take 8 mg by mouth every 8 (eight) hours as needed for nausea or vomiting., Disp: , Rfl:    pantoprazole (PROTONIX) 40 MG tablet, Take 40 mg by mouth daily before breakfast. , Disp: , Rfl:    polyethylene glycol (MIRALAX / GLYCOLAX) 17 g packet, Take 17 g by mouth daily. Capful, Disp: , Rfl:    Polyvinyl Alcohol (LUBRICANT DROPS OP), Place 1 drop into both eyes daily as needed (dry eye)., Disp: , Rfl:    propranolol ER (INDERAL LA) 60 MG 24 hr capsule, Take 60 mg by mouth every evening., Disp: , Rfl:    rosuvastatin (CRESTOR) 20 MG tablet, Take 1 tablet (20 mg total) by mouth at bedtime., Disp: 90 tablet, Rfl: 1   Semaglutide,0.25 or 0.5MG /DOS, 2 MG/1.5ML SOPN, Inject 0.5 mg into the skin once a week., Disp: , Rfl:    tamsulosin (FLOMAX) 0.4 MG CAPS capsule, Take 0.4 mg by mouth at bedtime. , Disp: , Rfl:    TRIUMEQ 600-50-300 MG tablet, Take 1 tablet by mouth daily., Disp: , Rfl:    buprenorphine (BUTRANS) 20 MCG/HR PTWK, Place 1 patch onto the skin once a week. (Patient not taking: Reported on 11/24/2023), Disp: , Rfl:    tiZANidine (ZANAFLEX) 4 MG tablet, Take 2-4 mg by mouth at bedtime. (Patient not taking: Reported on 11/24/2023), Disp: , Rfl:   Past Medical Problems: Past Medical History:  Diagnosis Date   Acute subdural hematoma (HCC) 10/28/2019   Anxiety    Arthritis    Cancer (HCC)    basal cell on nose   Cellulitis of right upper extremity 11/04/2020    Depression    Diabetes mellitus without complication (HCC)    Dysphagia    GERD (gastroesophageal reflux disease)    Hepatitis C    High cholesterol    History of COVID-19 05/26/2020   History of kidney stones    HIV (human immunodeficiency virus infection) (HCC)    Hypertension    Pneumonia    Renal disorder    Tachycardia     Past Surgical History: Past Surgical History:  Procedure Laterality Date   ANKLE ARTHROSCOPY     APPENDECTOMY     APPLICATION OF WOUND VAC Right 03/25/2021  Procedure: APPLICATION OF WOUND VAC;  Surgeon: Corliss Skains, MD;  Location: [facility] OR;  Service: Thoracic;  Laterality: Right;   BACK SURGERY     FUSION   CORONARY STENT INTERVENTION N/A 10/26/2022   Procedure: CORONARY STENT INTERVENTION;  Surgeon: Marykay Lex, MD;  Location: [facility] INVASIVE CV LAB;  Service: Cardiovascular;  Laterality: N/A;   DEBRIDEMENT AND CLOSURE WOUND Right 05/14/2021   Procedure: Right chest wound excision;  Surgeon: Peggye Form, DO;  Location: [facility];  Service: Plastics;  Laterality: Right;   INCISION AND DRAINAGE Right 03/25/2021   Procedure: INCISION AND DRAINAGE OF CHEST WALL;  Surgeon: Corliss Skains, MD;  Location: [facility];  Service: Thoracic;  Laterality: Right;   INCISION AND DRAINAGE ABSCESS Right 03/19/2021   Procedure: INCISION AND DRAINAGE RIGHT CHEST ABSCESS;  Surgeon: Corliss Skains, MD;  Location: [facility];  Service: Vascular;  Laterality: Right;   IR US GUIDE BX ASP/DRAIN  03/14/2021   IR US GUIDE BX ASP/DRAIN  03/14/2021   LEFT HEART CATH AND CORONARY ANGIOGRAPHY N/A 10/26/2022   Procedure: LEFT HEART CATH AND CORONARY ANGIOGRAPHY;  Surgeon: Marykay Lex, MD;  Location: [facility] INVASIVE CV LAB;  Service: Cardiovascular;  Laterality: N/A;   PENILE PROSTHESIS  REMOVAL  05/2020   Removal due to abscess   PENILE PROSTHESIS PLACEMENT  04/2020   SKIN SPLIT GRAFT Right 05/14/2021   Procedure: possible skin graft and Matriderm;  Surgeon: Peggye Form,  DO;  Location: [facility];  Service: Plastics;  Laterality: Right;   SKIN SPLIT GRAFT Right 06/24/2021   Procedure: SKIN GRAFT SPLIT THICKNESS TO RIGHT CHEST;  Surgeon: Peggye Form, DO;  Location: [facility];  Service: Plastics;  Laterality: Right;   THORACOTOMY Right 2008  ?   TONSILLECTOMY      Social History: Social History   Socioeconomic History   Marital status: Divorced    Spouse name: Not on file   Number of children: 2   Years of education: Not on file   Highest education level: Bachelor's degree (e.g., BA, AB, BS)  Occupational History   Not on file  Tobacco Use   Smoking status: Never   Smokeless tobacco: Never  Vaping Use   Vaping status: Never Used  Substance and Sexual Activity   Alcohol use: Not Currently   Drug use: Never   Sexual activity: Not on file  Other Topics Concern   Not on file  Social History Narrative   Pt is divorced   2 children   Right handed   Drinks soda, no coffee, no tea    Lives on the third floor of an apartment bldg. One story studio apartment   Social Drivers of Health   Financial Resource Strain: Not on file  Food Insecurity: Low Risk  (11/07/2023)   Received from [facility]   Hunger Vital Sign    Worried About Running Out of Food in the Last Year: Never true    Ran Out of Food in the Last Year: Never true  Transportation Needs: No Transportation Needs (11/07/2023)   Received from [employer]    In the past 12 months, has lack of reliable transportation kept you from medical appointments, meetings, work or from getting things needed for daily living? : No  Physical Activity: Not on file  Stress: Not on file  Social Connections: Not on file  Intimate Partner Violence: Not on file    Family History: Family History  Problem Relation Age of Onset   CAD Mother    Lung cancer Mother    CAD Father    Lung cancer Father    CAD Brother     Review of Systems: ROS Denies any recent chest pain, difficulty  breathing, leg swelling, fevers.  Physical Exam: Vital Signs BP (!) 158/82 (BP Location: Left Arm, Patient Position: Sitting, Cuff Size: Normal)   Pulse 73   SpO2 100%   Physical Exam  Constitutional:      General: Not in acute distress.    Appearance: Normal appearance. Not ill-appearing.  HENT:     Head: Normocephalic and atraumatic.  Eyes:     Pupils: Pupils are equal, round. Cardiovascular:     Rate and Rhythm: Normal rate.    Pulses: Normal pulses.  Pulmonary:     Effort: No respiratory distress or increased work of breathing.  Speaks in full sentences. Abdominal:     General: Abdomen is flat. No distension.   Musculoskeletal: Normal range of motion. No lower extremity swelling or edema. No varicosities. Skin:    General: Skin is warm and dry.     Findings: No erythema or rash.  Neurological:     Mental Status: Alert and oriented to person, place, and time.  Psychiatric:        Mood and Affect: Mood normal.        Behavior: Behavior normal.    Assessment/Plan: The patient is scheduled for excision of skin lesions from left cheek, left orbital rim, left ear, and left upper neck with Dr.  Ladona Ridgel .  Risks, benefits, and alternatives of procedure discussed, questions answered and consent obtained.    Smoking Status: Non-smoker.  Caprini Score: 4; Risk Factors include: Age, CAD, and length of planned surgery. Recommendation for mechanical prophylaxis.  Can restart his Plavix one day after surgery assuming hemostasis achieved at that time.  Encourage early ambulation.   Pictures obtained: 11/23/2023  Post-op Rx sent to pharmacy: Zofran. Will try to manage with Tylenol and consider oxycodone if needed due to post-operative pain.  Patient was provided with the General Surgical Risk consent document and Pain Medication Agreement prior to their appointment.  They had adequate time to read through the risk consent documents and Pain Medication Agreement. We also discussed them  in person together during this preop appointment. All of their questions were answered to their satisfaction.  Recommended calling if they have any further questions.  Risk consent form and Pain Medication Agreement to be scanned into patient's chart.    Electronically signed by: Evelena Leyden, PA-C 12/16/2023 3:24 PM

## 2024-01-02 ENCOUNTER — Ambulatory Visit: Payer: Medicare Other | Attending: Cardiology

## 2024-01-02 ENCOUNTER — Telehealth: Payer: Self-pay | Admitting: Adult Health

## 2024-01-02 DIAGNOSIS — Z01810 Encounter for preprocedural cardiovascular examination: Secondary | ICD-10-CM | POA: Diagnosis not present

## 2024-01-02 NOTE — Telephone Encounter (Signed)
 Pt is requesting a callback regarding his tele pre op visit a few minutes ago. He need clarity on how long to hold the plavix since he stated he was told 5 days but the notes are telling him 2 days. Please advise

## 2024-01-02 NOTE — Telephone Encounter (Signed)
 Called and spoke to patient. Verified name and DOB. Patient is calling to clarify the hold time on his Plavix  prior to surgery. Patient stated he was told during the visit to hold Plavix  5 days before surgery. The note from today's tele visit stated 2 days. Please advise.

## 2024-01-02 NOTE — Progress Notes (Addendum)
 Virtual Visit via Telephone Note   Because of Brian Malone's co-morbid illnesses, he is at least at moderate risk for complications without adequate follow up.  This format is felt to be most appropriate for this patient at this time.  The patient did not have access to video technology/had technical difficulties with video requiring transitioning to audio format only (telephone).  All issues noted in this document were discussed and addressed.  No physical exam could be performed with this format.  Please refer to the patient's chart for his consent to telehealth for [facility].  Evaluation Performed:  Preoperative cardiovascular risk assessment _____________   Date:  01/02/2024   Patient ID:  Brian Malone, Brian Malone 1951/06/08, MRN [id number] Patient Location:  Home Provider location:   Office  Primary Care Provider:  Nikki Rams, Aliene, MD Primary Cardiologist:  Soyla DELENA Merck, MD  Chief Complaint / Patient Profile   73 y.o. y/o male with a h/o hypertension, hyperlipidemia, diabetes, HIV, chronic kidney disease stage III, treated hepatitis C, and CAD.  Coronary CTA and cardiac catheterization with stent placement in the mid and distal LAD on Plavix .   He is pending excision of skin lesions, right cheek, left orbital rim, left ear, and left upper back by Dr. Leonce Birmingham, [facility] plastic surgery on January 09, 2024 . Recommendations are requested concerning holding Plavix  for 2 days prior and 1 day after procedure.  He presents today for telephonic preoperative cardiovascular risk assessment.  History of Present Illness    Brian Malone is a 73 y.o. male who presents via audio/video conferencing for a telehealth visit today.  Pt was last seen in cardiology clinic on 09/15/2023, by by Dr. Merck.  At that time Brian Malone was doing well he was to follow-up in 1 year.  PCP was investigating CKD, He is very active walking up and down stairs and lifting weights,  15,000-20,000 steps a day on an incline. Does 5 K runs 3 times a week. He denies chest pain, DOE, fatigue, or excessive bleeding on Plavix . He is medically compliant.    Past Medical History    Past Medical History:  Diagnosis Date   Acute subdural hematoma (HCC) 10/28/2019   Anxiety    Arthritis    Cancer (HCC)    basal cell on nose   Cellulitis of right upper extremity 11/04/2020   Depression    Diabetes mellitus without complication (HCC)    Dysphagia    GERD (gastroesophageal reflux disease)    Hepatitis C    High cholesterol    History of COVID-19 05/26/2020   History of kidney stones    HIV (human immunodeficiency virus infection) (HCC)    Hypertension    Pneumonia    Renal disorder    Tachycardia    Past Surgical History:  Procedure Laterality Date   ANKLE ARTHROSCOPY     APPENDECTOMY     APPLICATION OF WOUND VAC Right 03/25/2021   Procedure: APPLICATION OF WOUND VAC;  Surgeon: Shyrl Linnie KIDD, MD;  Location: [facility] OR;  Service: Thoracic;  Laterality: Right;   BACK SURGERY     FUSION   CORONARY STENT INTERVENTION N/A 10/26/2022   Procedure: CORONARY STENT INTERVENTION;  Surgeon: Anner Alm ORN, MD;  Location: [facility] INVASIVE CV LAB;  Service: Cardiovascular;  Laterality: N/A;   DEBRIDEMENT AND CLOSURE WOUND Right 05/14/2021   Procedure: Right chest wound excision;  Surgeon: Lowery Estefana RAMAN, DO;  Location: [facility] OR;  Service: Plastics;  Laterality: Right;   INCISION AND DRAINAGE Right 03/25/2021   Procedure: INCISION AND DRAINAGE OF CHEST WALL;  Surgeon: Shyrl Linnie KIDD, MD;  Location: [facility] OR;  Service: Thoracic;  Laterality: Right;   INCISION AND DRAINAGE ABSCESS Right 03/19/2021   Procedure: INCISION AND DRAINAGE RIGHT CHEST ABSCESS;  Surgeon: Shyrl Linnie KIDD, MD;  Location: [facility] OR;  Service: Vascular;  Laterality: Right;   IR US  GUIDE BX ASP/DRAIN  03/14/2021   IR US  GUIDE BX ASP/DRAIN  03/14/2021   LEFT HEART CATH AND CORONARY ANGIOGRAPHY N/A 10/26/2022    Procedure: LEFT HEART CATH AND CORONARY ANGIOGRAPHY;  Surgeon: Anner Alm ORN, MD;  Location: [facility] INVASIVE CV LAB;  Service: Cardiovascular;  Laterality: N/A;   PENILE PROSTHESIS  REMOVAL  05/2020   Removal due to abscess   PENILE PROSTHESIS PLACEMENT  04/2020   SKIN SPLIT GRAFT Right 05/14/2021   Procedure: possible skin graft and Matriderm;  Surgeon: Lowery Estefana RAMAN, DO;  Location: [facility] OR;  Service: Plastics;  Laterality: Right;   SKIN SPLIT GRAFT Right 06/24/2021   Procedure: SKIN GRAFT SPLIT THICKNESS TO RIGHT CHEST;  Surgeon: Lowery Estefana RAMAN, DO;  Location: [facility] OR;  Service: Plastics;  Laterality: Right;   THORACOTOMY Right 2008  ?   TONSILLECTOMY      Allergies  Allergies  Allergen Reactions   Adhesive [Tape] Other (See Comments)    Tape will take off my skin, as will Band-Aids   Cyclobenzaprine  Other (See Comments)    Hallucinations   Duloxetine  Hcl Other (See Comments)    Makes PTSD- induced nightmares more vivid    Morphine Itching and Other (See Comments)    In IV form causes hallucinations, aggression, and makes patient altered also only.  Oral morphine is ok     Zyvox  [Linezolid ] Nausea And Vomiting    And headaches   Pregabalin Anxiety    Pt reports insomnia and worsening anxiety     Home Medications    Prior to Admission medications   Medication Sig Start Date End Date Taking? Authorizing Provider  acetaminophen  (TYLENOL ) 325 MG tablet Take 650 mg by mouth every 6 (six) hours as needed for mild pain.    [provider]  buprenorphine (BUTRANS) 20 MCG/HR PTWK Place 1 patch onto the skin once a week. Patient not taking: Reported on 11/24/2023 10/09/21   [provider]  busPIRone  (BUSPAR ) 30 MG tablet Take 30 mg by mouth See admin instructions. Take 10 mg in the morning and 30 mg at bedtime 03/04/21   [provider]  Carboxymethylcellul-Glycerin (LUBRICATING EYE DROPS OP) Place 1 drop into both eyes daily as needed (dry eyes).     [provider]  clonazePAM  (KLONOPIN ) 0.5 MG tablet Take 0.5 mg by mouth in the morning, at noon, and at bedtime. Adjust with anxiety level 10/01/19   [provider]  clopidogrel  (PLAVIX ) 75 MG tablet TAKE 1 TABLET BY MOUTH DAILY 11/22/23   Acharya, Gayatri A, MD  docusate sodium  (COLACE) 250 MG capsule Take 250 mg by mouth daily.    [provider]  Ensure Max Protein (ENSURE MAX PROTEIN) LIQD Take 11 oz by mouth daily.    [provider]  ezetimibe  (ZETIA ) 10 MG tablet Take 10 mg by mouth every morning.    [provider]  loratadine  (CLARITIN ) 10 MG tablet Take 10 mg by mouth every evening.    [provider]  nitroGLYCERIN  (NITROSTAT ) 0.4 MG SL tablet Place 1 tablet (0.4 mg total)  under the tongue every 5 (five) minutes as needed. 10/26/22   Henry Manuelita NOVAK, NP  ondansetron  (ZOFRAN -ODT) 4 MG disintegrating tablet Take 1 tablet (4 mg total) by mouth every 8 (eight) hours as needed for nausea or vomiting. 12/16/23   Landy Honora CROME, PA-C  pantoprazole  (PROTONIX ) 40 MG tablet Take 40 mg by mouth daily before breakfast.  02/12/19   [provider]  polyethylene glycol (MIRALAX  / GLYCOLAX ) 17 g packet Take 17 g by mouth daily. Capful    [provider]  Polyvinyl Alcohol  (LUBRICANT DROPS OP) Place 1 drop into both eyes daily as needed (dry eye).    [provider]  propranolol  ER (INDERAL  LA) 60 MG 24 hr capsule Take 60 mg by mouth every evening. 04/28/21   [provider]  rosuvastatin  (CRESTOR ) 20 MG tablet Take 1 tablet (20 mg total) by mouth at bedtime. 10/26/22   Anner Alm ORN, MD  Semaglutide,0.25 or 0.5MG /DOS, 2 MG/1.5ML SOPN Inject 0.5 mg into the skin once a week. 10/08/19   [provider]  tamsulosin  (FLOMAX ) 0.4 MG CAPS capsule Take 0.4 mg by mouth at bedtime.  02/12/19   [provider]  tiZANidine  (ZANAFLEX ) 4 MG tablet Take 2-4 mg by mouth at bedtime. Patient not taking:  Reported on 11/24/2023 03/08/19   [provider]  TRIUMEQ  600-50-300 MG tablet Take 1 tablet by mouth daily.    [provider]    Physical Exam    Vital Signs:  Brian Malone does not have vital signs available for review today. 120/72, HR 67 bpm.  Given telephonic nature of communication, physical exam is limited. AAOx3. NAD. Normal affect.  Speech and respirations are unlabored.  Accessory Clinical Findings    None  Assessment & Plan    1.  Preoperative Cardiovascular Risk Assessment:  According to the Revised Cardiac Risk Index (RCRI), his Perioperative Risk of Major Cardiac Event is (%): 0.4  His Functional Capacity in METs is: 8.63 according to the [facility] Activity Status Index (DASI).   Per office protocol, if patient is without any new symptoms or concerns at the time of their virtual visit, he/she may hold Plavix  for 2 days prior to procedure. Please resume Plavix  as soon as possible postprocedure, at the discretion of the surgeon.    The patient was advised that if he develops new symptoms prior to surgery to contact our office to arrange for a follow-up visit, and he verbalized understanding.    A copy of this note will be routed to requesting surgeon.  Time:   Today, I have spent 10 minutes with the patient with telehealth technology discussing medical history, symptoms, and management plan.     Lamarr Satterfield, NP  01/02/2024, 2:04 PM

## 2024-01-03 NOTE — Telephone Encounter (Signed)
 Called patient back and let him know according to the pre-operative clearance request on 11/24/2023, the request was for 2 days prior to the procedure. Note from surgeon's office, Honora Seip PA noted to hold for 2 days as long as he continued to take ASA.   I advised him that he can hold up to 5 days but should contact the surgeon's office to clarify with them based upon their notes and clearance request.

## 2024-01-06 ENCOUNTER — Encounter (HOSPITAL_COMMUNITY): Payer: Self-pay | Admitting: Plastic Surgery

## 2024-01-06 NOTE — Anesthesia Preprocedure Evaluation (Signed)
Anesthesia Evaluation  Patient identified by MRN, date of birth, ID band Patient awake    Reviewed: Allergy & Precautions, NPO status , Patient's Chart, lab work & pertinent test results  History of Anesthesia Complications (+) PONV and history of anesthetic complications  Airway Mallampati: II  TM Distance: >3 FB Neck ROM: Full    Dental no notable dental hx.    Pulmonary sleep apnea and Continuous Positive Airway Pressure Ventilation    Pulmonary exam normal        Cardiovascular hypertension, Pt. on medications and Pt. on home beta blockers + CAD and + Cardiac Stents   Rhythm:Regular Rate:Normal     Neuro/Psych  Headaches  Anxiety Depression    CVA    GI/Hepatic ,GERD  Medicated,,(+) Hepatitis -, C  Endo/Other  diabetes    Renal/GU CRFRenal disease  negative genitourinary   Musculoskeletal  (+) Arthritis , Osteoarthritis,    Abdominal Normal abdominal exam  (+)   Peds  Hematology  (+) HIV  Anesthesia Other Findings   Reproductive/Obstetrics                             Anesthesia Physical Anesthesia Plan  ASA: 3  Anesthesia Plan: General   Post-op Pain Management:    Induction: Intravenous  PONV Risk Score and Plan: 3 and Ondansetron, Dexamethasone and Treatment may vary due to age or medical condition  Airway Management Planned: Mask, Oral ETT and Video Laryngoscope Planned  Additional Equipment: None  Intra-op Plan:   Post-operative Plan: Extubation in OR  Informed Consent: I have reviewed the patients History and Physical, chart, labs and discussed the procedure including the risks, benefits and alternatives for the proposed anesthesia with the patient or authorized representative who has indicated his/her understanding and acceptance.     Dental advisory given  Plan Discussed with: CRNA  Anesthesia Plan Comments: (PAT note by Antionette Poles, PA-C: 73 y.o. 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  Seen by Joni Reining, NP on 01/02/2024 for preop cardiology evaluation.  Per note, "According to the Revised Cardiac Risk Index (RCRI), his Perioperative Risk of Major Cardiac Event is (%): 0.4. His Functional Capacity in METs is: 8.63 according to the [facility] Activity Status Index (DASI). Per office protocol, if patient is without any new symptoms or concerns at the time of their virtual visit, he/she may hold Plavix for 2 days prior to procedure. Please resume Plavix as soon as possible postprocedure, at the discretion of the surgeon."  Recently status post insertion of inflatable penile prosthesis 11/07/2023 at [facility]. No complications noted.  Reports last dose of Ozempic 12/21/2023.  Reports last dose Plavix 01/03/2024.  Pt will need DOS labs and eval.  EKG 09/15/23: NSR. Rate 65.  Cath and PCI 10/26/2022:    CULPRIT LESION: Mid LAD to Dist LAD lesion is 75% stenosed. (FFRct 0.76)   A drug-eluting stent was successfully placed using a SYNERGY XD 2.50X28. -Postdilated to 2.8 mm   Post intervention, there is a 0% residual stenosis.   ----------------------------------------------   2nd Diag lesion is 80% stenosed.   LPAV lesion is 80% stenosed.   Prox RCA to Mid RCA lesion is  50% stenosed.   ----------------------------------------------   The left ventricular systolic function is normal.  The left ventricular ejection fraction is 50-55% by visual estimate.   LV end diastolic pressure is normal.   There is no aortic valve stenosis.  POSTPROCEDURE DIAGNOSES  Severe ~3 Vessel CAD  o Culprit Lesion: Mid LAD eccentric/irregular, mildly calcified 75-85% stenosis  ->    ? successfully treated with DES PCI: Synergy XD 2.5 x 28 postdilated to 2.8 mm.   ? Reduced to 0%.  TIMI-3 flow pre and post.  o Proximal D3 (small caliber vessel 80% stenosis-at most 2 mm vessel, not favorable for PCI target)  o Distal AVG LCx 70 to 80% stenosis (1 mm vessel)  Preserved LVEF of roughly 50 to 55% with no obvious RWMA and normal EDP.    RECOMMENDATIONS  Okay for same-day discharge.  We will plan DAPT for 3 months and continue Plavix to try to complete 1 year.  Okay to interrupt at 6 months  Continue aggressive risk modification-lipid, glycemic and blood pressure control.   )        Anesthesia Quick Evaluation

## 2024-01-06 NOTE — Progress Notes (Signed)
Anesthesia Chart Review: Same day workup  73 y.o. 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  Seen by Joni Reining, NP on 01/02/2024 for preop cardiology evaluation.  Per note, "According to the Revised Cardiac Risk Index (RCRI), his Perioperative Risk of Major Cardiac Event is (%): 0.4. His Functional Capacity in METs is: 8.63 according to the [facility] Activity Status Index (DASI). Per office protocol, if patient is without any new symptoms or concerns at the time of their virtual visit, he/she may hold Plavix for 2 days prior to procedure. Please resume Plavix as soon as possible postprocedure, at the discretion of the surgeon."  Recently status post insertion of inflatable penile prosthesis 11/07/2023 at [facility]. No complications noted.  Reports last dose of Ozempic 12/21/2023.  Reports last dose Plavix 01/03/2024.  Pt will need DOS labs and eval.  EKG 09/15/23: NSR. Rate 65.  Cath and PCI 10/26/2022:    CULPRIT LESION: Mid LAD to Dist LAD lesion is 75% stenosed. (FFRct 0.76)   A drug-eluting stent was successfully placed using a SYNERGY XD 2.50X28. -Postdilated to 2.8 mm   Post intervention, there is a 0% residual stenosis.   ----------------------------------------------   2nd Diag lesion is 80% stenosed.   LPAV lesion is 80% stenosed.   Prox RCA to Mid RCA lesion is 50% stenosed.   ----------------------------------------------   The left ventricular systolic function is normal.  The left ventricular ejection fraction is 50-55% by visual estimate.   LV end diastolic pressure is normal.   There is no aortic valve stenosis.   POSTPROCEDURE DIAGNOSES Severe ~3 Vessel CAD Culprit Lesion: Mid LAD  eccentric/irregular, mildly calcified 75-85% stenosis ->  successfully treated with DES PCI: Synergy XD 2.5 x 28 postdilated to 2.8 mm. Reduced to 0%.  TIMI-3 flow pre and post. Proximal D3 (small caliber vessel 80% stenosis-at most 2 mm vessel, not favorable for PCI target) Distal AVG LCx 70 to 80% stenosis (1 mm vessel) Preserved LVEF of roughly 50 to 55% with no obvious RWMA and normal EDP.      RECOMMENDATIONS Okay for same-day discharge. We will plan DAPT for 3 months and continue Plavix to try to complete 1 year.  Okay to interrupt at 6 months Continue aggressive risk modification-lipid, glycemic and blood pressure control.   Zannie Cove [facility] Short Stay [facility]/Anesthesiology Phone [phone] 01/06/2024 1:36 PM

## 2024-01-06 NOTE — Progress Notes (Signed)
SDW call  Patient was given pre-op instructions over the phone. Patient verbalized understanding of instructions provided.     PCP - Dr. Lenox Ponds Cardiologist - Dr. Weston Brass Pulmonary:    PPM/ICD - denies Device Orders - na Rep Notified - na   Chest x-ray - na EKG -  09/15/2023 Stress Test - ECHO - 11/23/2019 Cardiac Cath - 10/26/2022  Sleep Study/sleep apnea/CPAP: diagnosed with sleep apnea, does not use CPAP.    Type II diabetic, does not check sugars.  A1C 6.2 11/01/2023 Fasting Blood sugar range: doesn't check How often check sugars: doesn't check Ozempic, states last dose 12/21/2023   Blood Thinner Instructions: Plavix, states last dose 01/03/2024. States confusion between cardiology and surgeon as when to stop, 2 days vs 5 days, so he just stopped it.  Aspirin Instructions: Did not start ASA as recommended by cardiology   ERAS Protcol - Clears until 1000   Anesthesia review: HTN, DM, sleep apnea, hx subdural hematoma, hx heart cath, Hep Cand HIV +   Patient denies shortness of breath, fever, cough and chest pain over the phone call  Your procedure is scheduled on Monday January 09, 2024  Report to [facility] Main Entrance "A" at  1030  A.M., then check in with the Admitting office.  Call this number if you have problems the morning of surgery:  [phone]   If you have any questions prior to your surgery date call [phone]: Open Monday-Friday 8am-4pm If you experience any cold or flu symptoms such as cough, fever, chills, shortness of breath, etc. between now and your scheduled surgery, please notify us at the above number    Remember:  Do not eat after midnight the night before your surgery  You may drink clear liquids until 1000  the morning of your surgery.   Clear liquids allowed are: Water, Non-Citrus Juices (without pulp), Carbonated Beverages, Clear Tea, Black Coffee ONLY (NO MILK, CREAM OR POWDERED CREAMER of any kind), and  Gatorade   Take these medicines the morning of surgery with A SIP OF WATER:  Zetia, protonix  As needed: Tylenol, eye drops, flonase, zofran  As of today, STOP taking any Aleve, Naproxen, Ibuprofen, Motrin, Advil, Goody's, BC's, all herbal medications, fish oil, and all vitamins.

## 2024-01-09 ENCOUNTER — Encounter (HOSPITAL_COMMUNITY): Payer: Self-pay | Admitting: Plastic Surgery

## 2024-01-09 ENCOUNTER — Ambulatory Visit (HOSPITAL_COMMUNITY)
Admission: RE | Admit: 2024-01-09 | Discharge: 2024-01-09 | Disposition: A | Discharge status: Home / Self Care | Payer: Medicare Other | Attending: Plastic Surgery | Admitting: Plastic Surgery

## 2024-01-09 ENCOUNTER — Ambulatory Visit (HOSPITAL_COMMUNITY): Payer: Self-pay | Admitting: Physician Assistant

## 2024-01-09 ENCOUNTER — Encounter (HOSPITAL_COMMUNITY)
Admission: RE | Disposition: A | Discharge status: Home / Self Care | Payer: Self-pay | Source: Home / Self Care | Attending: Plastic Surgery

## 2024-01-09 ENCOUNTER — Other Ambulatory Visit: Payer: Self-pay

## 2024-01-09 ENCOUNTER — Ambulatory Visit (HOSPITAL_BASED_OUTPATIENT_CLINIC_OR_DEPARTMENT_OTHER): Payer: Medicare Other | Admitting: Physician Assistant

## 2024-01-09 DIAGNOSIS — D220 Melanocytic nevi of lip: Secondary | ICD-10-CM | POA: Insufficient documentation

## 2024-01-09 DIAGNOSIS — L570 Actinic keratosis: Secondary | ICD-10-CM | POA: Diagnosis not present

## 2024-01-09 DIAGNOSIS — Z85828 Personal history of other malignant neoplasm of skin: Secondary | ICD-10-CM | POA: Insufficient documentation

## 2024-01-09 DIAGNOSIS — Z9109 Other allergy status, other than to drugs and biological substances: Secondary | ICD-10-CM | POA: Diagnosis not present

## 2024-01-09 DIAGNOSIS — E1122 Type 2 diabetes mellitus with diabetic chronic kidney disease: Secondary | ICD-10-CM | POA: Insufficient documentation

## 2024-01-09 DIAGNOSIS — L989 Disorder of the skin and subcutaneous tissue, unspecified: Secondary | ICD-10-CM | POA: Insufficient documentation

## 2024-01-09 DIAGNOSIS — F419 Anxiety disorder, unspecified: Secondary | ICD-10-CM | POA: Insufficient documentation

## 2024-01-09 DIAGNOSIS — E7800 Pure hypercholesterolemia, unspecified: Secondary | ICD-10-CM | POA: Insufficient documentation

## 2024-01-09 DIAGNOSIS — Z79899 Other long term (current) drug therapy: Secondary | ICD-10-CM | POA: Diagnosis not present

## 2024-01-09 DIAGNOSIS — G4733 Obstructive sleep apnea (adult) (pediatric): Secondary | ICD-10-CM | POA: Diagnosis not present

## 2024-01-09 DIAGNOSIS — Z955 Presence of coronary angioplasty implant and graft: Secondary | ICD-10-CM | POA: Diagnosis not present

## 2024-01-09 DIAGNOSIS — N189 Chronic kidney disease, unspecified: Secondary | ICD-10-CM

## 2024-01-09 DIAGNOSIS — B079 Viral wart, unspecified: Secondary | ICD-10-CM

## 2024-01-09 DIAGNOSIS — Z7985 Long-term (current) use of injectable non-insulin antidiabetic drugs: Secondary | ICD-10-CM | POA: Diagnosis not present

## 2024-01-09 DIAGNOSIS — I2510 Atherosclerotic heart disease of native coronary artery without angina pectoris: Secondary | ICD-10-CM

## 2024-01-09 DIAGNOSIS — Z21 Asymptomatic human immunodeficiency virus [HIV] infection status: Secondary | ICD-10-CM | POA: Insufficient documentation

## 2024-01-09 DIAGNOSIS — I129 Hypertensive chronic kidney disease with stage 1 through stage 4 chronic kidney disease, or unspecified chronic kidney disease: Secondary | ICD-10-CM | POA: Insufficient documentation

## 2024-01-09 DIAGNOSIS — Z981 Arthrodesis status: Secondary | ICD-10-CM | POA: Diagnosis not present

## 2024-01-09 DIAGNOSIS — L821 Other seborrheic keratosis: Secondary | ICD-10-CM | POA: Diagnosis not present

## 2024-01-09 DIAGNOSIS — K219 Gastro-esophageal reflux disease without esophagitis: Secondary | ICD-10-CM | POA: Insufficient documentation

## 2024-01-09 DIAGNOSIS — N183 Chronic kidney disease, stage 3 unspecified: Secondary | ICD-10-CM | POA: Diagnosis not present

## 2024-01-09 DIAGNOSIS — Z7902 Long term (current) use of antithrombotics/antiplatelets: Secondary | ICD-10-CM | POA: Diagnosis not present

## 2024-01-09 DIAGNOSIS — B078 Other viral warts: Secondary | ICD-10-CM | POA: Diagnosis not present

## 2024-01-09 HISTORY — DX: Other specified postprocedural states: Z98.890

## 2024-01-09 HISTORY — DX: Sleep apnea, unspecified: G47.30

## 2024-01-09 HISTORY — DX: Other complications of anesthesia, initial encounter: T88.59XA

## 2024-01-09 HISTORY — PX: LESION REMOVAL: SHX5196

## 2024-01-09 LAB — COMPREHENSIVE METABOLIC PANEL
ALT: 23 U/L (ref 0–44)
AST: 34 U/L (ref 15–41)
Albumin: 3.9 g/dL (ref 3.5–5.0)
Alkaline Phosphatase: 91 U/L (ref 38–126)
Anion gap: 10 (ref 5–15)
BUN: 23 mg/dL (ref 8–23)
CO2: 22 mmol/L (ref 22–32)
Calcium: 9 mg/dL (ref 8.9–10.3)
Chloride: 106 mmol/L (ref 98–111)
Creatinine, Ser: 1.39 mg/dL — ABNORMAL HIGH (ref 0.61–1.24)
GFR, Estimated: 55 mL/min — ABNORMAL LOW (ref >60)
Glucose, Bld: 117 mg/dL — ABNORMAL HIGH (ref 70–99)
Potassium: 4.5 mmol/L (ref 3.5–5.1)
Sodium: 138 mmol/L (ref 135–145)
Total Bilirubin: 1 mg/dL (ref 0.0–1.2)
Total Protein: 6.8 g/dL (ref 6.5–8.1)

## 2024-01-09 LAB — CBC
HCT: 41.6 % (ref 39.0–52.0)
Hemoglobin: 13 g/dL (ref 13.0–17.0)
MCH: 31.2 pg (ref 26.0–34.0)
MCHC: 31.3 g/dL (ref 30.0–36.0)
MCV: 99.8 fL (ref 80.0–100.0)
Platelets: 131 10*3/uL — ABNORMAL LOW (ref 150–400)
RBC: 4.17 MIL/uL — ABNORMAL LOW (ref 4.22–5.81)
RDW: 13.7 % (ref 11.5–15.5)
WBC: 5 10*3/uL (ref 4.0–10.5)
nRBC: 0 % (ref 0.0–0.2)

## 2024-01-09 LAB — SURGICAL PCR SCREEN
MRSA, PCR: NEGATIVE
Staphylococcus aureus: NEGATIVE

## 2024-01-09 LAB — GLUCOSE, CAPILLARY
Glucose-Capillary: 114 mg/dL — ABNORMAL HIGH (ref 70–99)
Glucose-Capillary: 99 mg/dL (ref 70–99)
Glucose-Capillary: 107 mg/dL — ABNORMAL HIGH (ref 70–99)

## 2024-01-09 SURGERY — WIDE EXCISION, LESION, UPPER EXTREMITY
Anesthesia: General | Laterality: Left

## 2024-01-09 MED ORDER — PROPOFOL 10 MG/ML IV BOLUS
INTRAVENOUS | Status: DC | PRN
  Administered 2024-01-09: 160 mg via INTRAVENOUS

## 2024-01-09 MED ORDER — BUPIVACAINE-EPINEPHRINE (PF) 0.25% -1:200000 IJ SOLN
INTRAMUSCULAR | Status: AC
  Filled 2024-01-09: qty 30

## 2024-01-09 MED ORDER — MIDAZOLAM HCL 2 MG/2ML IJ SOLN
INTRAMUSCULAR | Status: AC
  Filled 2024-01-09: qty 2

## 2024-01-09 MED ORDER — CHLORHEXIDINE GLUCONATE CLOTH 2 % EX PADS
6.0000 | MEDICATED_PAD | Freq: Once | CUTANEOUS | Status: DC
Start: 2024-01-09 — End: 2024-01-09

## 2024-01-09 MED ORDER — CHLORHEXIDINE GLUCONATE 0.12 % MT SOLN
15.0000 mL | Freq: Once | OROMUCOSAL | Status: AC
  Administered 2024-01-09: 15 mL via OROMUCOSAL
  Filled 2024-01-09: qty 15

## 2024-01-09 MED ORDER — FENTANYL CITRATE (PF) 250 MCG/5ML IJ SOLN
INTRAMUSCULAR | Status: DC | PRN
  Administered 2024-01-09 (×2): 50 ug via INTRAVENOUS

## 2024-01-09 MED ORDER — LACTATED RINGERS IV SOLN: INTRAVENOUS | Status: DC

## 2024-01-09 MED ORDER — BUPIVACAINE-EPINEPHRINE 0.25% -1:200000 IJ SOLN
INTRAMUSCULAR | Status: DC | PRN
  Administered 2024-01-09: 5 mL

## 2024-01-09 MED ORDER — DEXAMETHASONE SODIUM PHOSPHATE 10 MG/ML IJ SOLN
INTRAMUSCULAR | Status: DC | PRN
  Administered 2024-01-09: 5 mg via INTRAVENOUS

## 2024-01-09 MED ORDER — LIDOCAINE 2% (20 MG/ML) 5 ML SYRINGE
INTRAMUSCULAR | Status: DC | PRN
  Administered 2024-01-09: 80 mg via INTRAVENOUS

## 2024-01-09 MED ORDER — SUGAMMADEX SODIUM 200 MG/2ML IV SOLN
INTRAVENOUS | Status: DC | PRN
  Administered 2024-01-09: 170 mg via INTRAVENOUS

## 2024-01-09 MED ORDER — BACITRACIN ZINC 500 UNIT/GM EX OINT
TOPICAL_OINTMENT | CUTANEOUS | Status: DC | PRN
  Administered 2024-01-09: 1 via TOPICAL

## 2024-01-09 MED ORDER — ROCURONIUM BROMIDE 10 MG/ML (PF) SYRINGE
PREFILLED_SYRINGE | INTRAVENOUS | Status: DC | PRN
  Administered 2024-01-09: 50 mg via INTRAVENOUS

## 2024-01-09 MED ORDER — ORAL CARE MOUTH RINSE: 15.0000 mL | Freq: Once | OROMUCOSAL | Status: AC

## 2024-01-09 MED ORDER — SODIUM CHLORIDE 0.9 % IV SOLN: INTRAVENOUS | Status: DC | PRN

## 2024-01-09 MED ORDER — FENTANYL CITRATE (PF) 250 MCG/5ML IJ SOLN
INTRAMUSCULAR | Status: AC
  Filled 2024-01-09: qty 5

## 2024-01-09 MED ORDER — MIDAZOLAM HCL 2 MG/2ML IJ SOLN
INTRAMUSCULAR | Status: DC | PRN
  Administered 2024-01-09: 1 mg via INTRAVENOUS

## 2024-01-09 MED ORDER — EPHEDRINE SULFATE-NACL 50-0.9 MG/10ML-% IV SOSY
PREFILLED_SYRINGE | INTRAVENOUS | Status: DC | PRN
  Administered 2024-01-09: 5 mg via INTRAVENOUS

## 2024-01-09 MED ORDER — PHENYLEPHRINE HCL-NACL 20-0.9 MG/250ML-% IV SOLN
INTRAVENOUS | Status: DC | PRN
  Administered 2024-01-09: 40 ug/min via INTRAVENOUS

## 2024-01-09 MED ORDER — ONDANSETRON HCL 4 MG/2ML IJ SOLN
INTRAMUSCULAR | Status: DC | PRN
  Administered 2024-01-09: 4 mg via INTRAVENOUS

## 2024-01-09 MED ORDER — PROPOFOL 10 MG/ML IV BOLUS
INTRAVENOUS | Status: AC
  Filled 2024-01-09: qty 20

## 2024-01-09 MED ORDER — 0.9 % SODIUM CHLORIDE (POUR BTL) OPTIME
TOPICAL | Status: DC | PRN
  Administered 2024-01-09: 1000 mL

## 2024-01-09 SURGICAL SUPPLY — 28 items
BAG COUNTER SPONGE SURGICOUNT (BAG) ×1 IMPLANT
CANISTER SUCT 3000ML PPV (MISCELLANEOUS) IMPLANT
CNTNR URN SCR LID CUP LEK RST (MISCELLANEOUS) ×1 IMPLANT
COVER SURGICAL LIGHT HANDLE (MISCELLANEOUS) ×1 IMPLANT
DRSG EMULSION OIL 3X3 NADH (GAUZE/BANDAGES/DRESSINGS) IMPLANT
ELECT CAUTERY BLADE 6.4 (BLADE) IMPLANT
ELECT NDL TIP 2.8 STRL (NEEDLE) IMPLANT
ELECT NEEDLE TIP 2.8 STRL (NEEDLE) IMPLANT
ELECT REM PT RETURN 9FT ADLT (ELECTROSURGICAL) ×1
ELECTRODE REM PT RTRN 9FT ADLT (ELECTROSURGICAL) ×1 IMPLANT
GAUZE 4X4 16PLY ~~LOC~~+RFID DBL (SPONGE) ×1 IMPLANT
GAUZE SPONGE 4X4 12PLY STRL (GAUZE/BANDAGES/DRESSINGS) IMPLANT
GLOVE BIO SURGEON STRL SZ 6.5 (GLOVE) ×1 IMPLANT
GOWN STRL REUS W/ TWL LRG LVL3 (GOWN DISPOSABLE) ×2 IMPLANT
KIT BASIN OR (CUSTOM PROCEDURE TRAY) ×1 IMPLANT
KIT TURNOVER KIT B (KITS) ×1 IMPLANT
NDL HYPO 25GX1X1/2 BEV (NEEDLE) ×1 IMPLANT
NEEDLE HYPO 25GX1X1/2 BEV (NEEDLE) ×1 IMPLANT
NS IRRIG 1000ML POUR BTL (IV SOLUTION) ×1 IMPLANT
PACK SRG BSC III STRL LF ECLPS (CUSTOM PROCEDURE TRAY) ×1 IMPLANT
PAD ARMBOARD 7.5X6 YLW CONV (MISCELLANEOUS) ×1 IMPLANT
PENCIL BUTTON HOLSTER BLD 10FT (ELECTRODE) ×1 IMPLANT
SUT PROLENE 5 0 PS 2 (SUTURE) IMPLANT
SYR BULB EAR ULCER 3OZ GRN STR (SYRINGE) IMPLANT
SYR CONTROL 10ML LL (SYRINGE) IMPLANT
TOWEL GREEN STERILE (TOWEL DISPOSABLE) ×1 IMPLANT
TOWEL GREEN STERILE FF (TOWEL DISPOSABLE) ×1 IMPLANT
YANKAUER SUCT BULB TIP NO VENT (SUCTIONS) IMPLANT

## 2024-01-09 NOTE — Interval H&P Note (Signed)
History and Physical Interval Note: No change in exam or indication for surgery Sites marked with his concurrence All questions answered. Will proceed with biopsy of skin lesions at his request  01/09/2024 12:47 PM  Brian Malone  has presented today for surgery, with the diagnosis of skin lesions.  The various methods of treatment have been discussed with the patient and family. After consideration of risks, benefits and other options for treatment, the patient has consented to  Procedure(s): Excision of skin lesions: left cheek, left orbital rim, left ear, left upper neck (Left) as a surgical intervention.  The patient's history has been reviewed, patient examined, no change in status, stable for surgery.  I have reviewed the patient's chart and labs.  Questions were answered to the patient's satisfaction.     Santiago Glad

## 2024-01-09 NOTE — Transfer of Care (Signed)
Immediate Anesthesia Transfer of Care Note  Patient: Brian Malone  Procedure(s) Performed: Excision of skin lesions: left cheek, left orbital rim, left ear, left upper neck (Left)  Patient Location: PACU  Anesthesia Type:General  Level of Consciousness: awake, alert , and oriented  Airway & Oxygen Therapy: Patient Spontanous Breathing  Post-op Assessment: Report given to RN, Post -op Vital signs reviewed and stable, and Patient moving all extremities  Post vital signs: Reviewed and stable  Last Vitals:  Vitals Value Taken Time  BP 143/68 01/09/24 1410  Temp 36.6 C 01/09/24 1410  Pulse 58 01/09/24 1413  Resp 18 01/09/24 1413  SpO2 96 % 01/09/24 1413  Vitals shown include unfiled device data.  Last Pain:  Vitals:   01/09/24 1040  TempSrc:   PainSc: 0-No pain         Complications: No notable events documented.

## 2024-01-09 NOTE — Discharge Instructions (Signed)
Diet: High protein, low sugar.  Medications: The Zofran that was prescribed is only as needed for nausea control.  As for pain control, please take Tylenol 500 mg every 6 hours as needed.    Wound care: Your excision sites were all closed with simple interrupted Prolene sutures which will need to be removed in 7 days.  In the meantime, simply apply gauze if needed for any incisional bleeding. Bacitracin ointment was applied in the OR to all of the excision sites.  You may place Vaseline over the wounds as needed to help protect them and keep them clean/covered.   Activity: As tolerated.  Mild excisional site drainage and bleeding is OK. Simply hold pressure with gauze as needed.  You may also cover with Vaseline to help occlude the wound and keep it clean and protected.    Please call the office should you have severe pain or swelling, surrounding redness or malodor, wound dehiscence (if it completely opens), or if you have any fevers.

## 2024-01-09 NOTE — Anesthesia Procedure Notes (Signed)
Procedure Name: Intubation Date/Time: 01/09/2024 1:26 PM  Performed by: Kayleen Memos, CRNAPre-anesthesia Checklist: Patient identified, Emergency Drugs available, Suction available and Patient being monitored Patient Re-evaluated:Patient Re-evaluated prior to induction Oxygen Delivery Method: Circle System Utilized Preoxygenation: Pre-oxygenation with 100% oxygen Induction Type: IV induction Ventilation: Mask ventilation without difficulty Laryngoscope Size: Mac and 4 Grade View: Grade I Tube type: Oral Tube size: 7.5 mm Number of attempts: 1 Airway Equipment and Method: Stylet Placement Confirmation: ETT inserted through vocal cords under direct vision, positive ETCO2 and breath sounds checked- equal and bilateral Secured at: 23 cm Tube secured with: Tape Dental Injury: Teeth and Oropharynx as per pre-operative assessment

## 2024-01-09 NOTE — Op Note (Signed)
DATE OF OPERATION: 01/09/2024  LOCATION: [facility] Main operating Room  PREOPERATIVE DIAGNOSIS: Skin lesions x 5 face, skin lesion x 1 chest left side  POSTOPERATIVE DIAGNOSIS: Same  PROCEDURE: Excision of multiple skin lesions  SURGEON: Loren Racer, MD  ASSISTANT: Evelena Leyden  EBL: 5 cc  CONDITION: Stable  COMPLICATIONS: None  INDICATION: The patient, Brian Malone, is a 73 y.o. male born on 02-03-1951, is here for biopsy of multiple skin lesions secondary to history of skin cancers.   PROCEDURE DETAILS:  The patient was seen prior to surgery and marked.  No IV antibiotics were given. The patient was taken to the operating room and given a general anesthetic. A standard time out was performed and all information was confirmed by those in the room. SCDs were placed.   The face and upper chest were prepped and draped with Betadine.  The lesions and the subcutaneous tissue were infiltrated with quarter percent Marcaine with epinephrine.  5 skin lesions on the face were excised with elliptical incisions.  The first lesion was in the posterior auricular region and measured approximately 5 mm, the second lesion was on the left ear measured approximately 5 mm, the third lesion was at the left lateral orbital rim and measured approximately 5 mm the fourth lesion was over the left cheek and measured approximately 5 mm the fifth lesion was at the left upper lip vermilion border and measured approximately 5 mm.  All incisions were closed with interrupted 5-0 Prolene sutures.  The lesion on the chest was excised and measured approximately 5 mm.  Lesion was closed with interrupted 5-0 Prolene sutures.  The incisions were dressed with Vaseline as the patient had a adhesive allergy.  He was awakened from anesthesia without incident transferred to the recovery room in good condition.  All instrument needle and sponge counts reported as correct and there were no complications noted during the procedure.  He will  follow-up in the office in 5 to 7 days for suture removal and to evaluate pathology. The patient was allowed to wake up and taken to recovery room in stable condition at the end of the case. The family was notified at the end of the case.   The advanced practice practitioner (APP) assisted throughout the case.  The APP was essential in retraction and counter traction when needed to make the case progress smoothly.  This retraction and assistance made it possible to see the tissue plans for the procedure.  The assistance was needed for blood control, tissue re-approximation and assisted with closure of the incision site.

## 2024-01-10 ENCOUNTER — Encounter (HOSPITAL_COMMUNITY): Payer: Self-pay | Admitting: Plastic Surgery

## 2024-01-10 NOTE — Anesthesia Postprocedure Evaluation (Signed)
Anesthesia Post Note  Patient: Brian Malone  Procedure(s) Performed: Excision of skin lesions: left cheek, left orbital rim, left ear, left upper neck (Left)     Patient location during evaluation: PACU Anesthesia Type: General Level of consciousness: awake and alert Pain management: pain level controlled Vital Signs Assessment: post-procedure vital signs reviewed and stable Respiratory status: spontaneous breathing, nonlabored ventilation, respiratory function stable and patient connected to nasal cannula oxygen Cardiovascular status: blood pressure returned to baseline and stable Postop Assessment: no apparent nausea or vomiting Anesthetic complications: no   No notable events documented.  Last Vitals:  Vitals:   01/09/24 1430 01/09/24 1438  BP: 126/86 134/75  Pulse: (!) 50 (!) 55  Resp: 10 (!) 9  Temp:  36.6 C  SpO2: 96% 96%    Last Pain:  Vitals:   01/09/24 1430  TempSrc:   PainSc: 0-No pain                 Brian Malone

## 2024-01-11 LAB — SURGICAL PATHOLOGY

## 2024-01-16 ENCOUNTER — Encounter: Payer: Medicare Other | Admitting: Plastic Surgery

## 2024-01-18 ENCOUNTER — Ambulatory Visit: Payer: Medicare Other | Admitting: Plastic Surgery

## 2024-01-18 VITALS — BP 120/73 | HR 72

## 2024-01-18 DIAGNOSIS — L989 Disorder of the skin and subcutaneous tissue, unspecified: Secondary | ICD-10-CM

## 2024-01-18 DIAGNOSIS — Z9889 Other specified postprocedural states: Secondary | ICD-10-CM

## 2024-01-18 NOTE — Progress Notes (Signed)
Brian Malone returns today for suture removal after excision of multiple skin lesions on the face and chest.  He has had no problems since surgery.  Sutures removed today without difficulty.  On review of his pathology he has 2 areas of actinic keratoses on the face.  We reviewed the importance of sunscreen and minimizing sun exposure.  I will refer him to dermatology for a full body scan and for discussion of management of his actinic keratoses.  He may return as needed.

## 2024-02-01 ENCOUNTER — Encounter: Payer: Medicare Other | Admitting: Physician Assistant

## 2024-02-13 ENCOUNTER — Encounter: Payer: Medicare Other | Admitting: Physician Assistant

## 2024-02-15 ENCOUNTER — Encounter: Payer: Self-pay | Admitting: Dermatology

## 2024-02-15 ENCOUNTER — Ambulatory Visit (INDEPENDENT_AMBULATORY_CARE_PROVIDER_SITE_OTHER): Payer: Medicare Other | Admitting: Dermatology

## 2024-02-15 VITALS — BP 145/77 | HR 76

## 2024-02-15 DIAGNOSIS — L814 Other melanin hyperpigmentation: Secondary | ICD-10-CM

## 2024-02-15 DIAGNOSIS — D1801 Hemangioma of skin and subcutaneous tissue: Secondary | ICD-10-CM

## 2024-02-15 DIAGNOSIS — L821 Other seborrheic keratosis: Secondary | ICD-10-CM

## 2024-02-15 DIAGNOSIS — D229 Melanocytic nevi, unspecified: Secondary | ICD-10-CM

## 2024-02-15 DIAGNOSIS — Z85828 Personal history of other malignant neoplasm of skin: Secondary | ICD-10-CM

## 2024-02-15 DIAGNOSIS — W908XXA Exposure to other nonionizing radiation, initial encounter: Secondary | ICD-10-CM

## 2024-02-15 DIAGNOSIS — Z872 Personal history of diseases of the skin and subcutaneous tissue: Secondary | ICD-10-CM

## 2024-02-15 DIAGNOSIS — Z1283 Encounter for screening for malignant neoplasm of skin: Secondary | ICD-10-CM | POA: Diagnosis not present

## 2024-02-15 DIAGNOSIS — L570 Actinic keratosis: Secondary | ICD-10-CM | POA: Diagnosis not present

## 2024-02-15 DIAGNOSIS — Z808 Family history of malignant neoplasm of other organs or systems: Secondary | ICD-10-CM

## 2024-02-15 DIAGNOSIS — L578 Other skin changes due to chronic exposure to nonionizing radiation: Secondary | ICD-10-CM

## 2024-02-15 NOTE — Patient Instructions (Signed)
 Important Information  Due to recent changes in healthcare laws, you may see results of your pathology and/or laboratory studies on MyChart before the doctors have had a chance to review them. We understand that in some cases there may be results that are confusing or concerning to you. Please understand that not all results are received at the same time and often the doctors may need to interpret multiple results in order to provide you with the best plan of care or course of treatment. Therefore, we ask that you please give [place] 2 business days to thoroughly review all your results before contacting the office for clarification. Should we see a critical lab result, you will be contacted sooner.   If You Need Anything After Your Visit  If you have any questions or concerns for your doctor, please call our main line at [phone] If no one answers, please leave a voicemail as directed and we will return your call as soon as possible. Messages left after 4 pm will be answered the following business day.   You may also send [place] a message via MyChart. We typically respond to MyChart messages within 1-2 business days.  For prescription refills, please ask your pharmacy to contact our office. Our fax number is [phone].  If you have an urgent issue when the clinic is closed that cannot wait until the next business day, you can page your doctor at the number below.    Please note that while we do our best to be available for urgent issues outside of office hours, we are not available 24/7.   If you have an urgent issue and are unable to reach [place], you may choose to seek medical care at your doctor's office, retail clinic, urgent care center, or emergency room.  If you have a medical emergency, please immediately call 911 or go to the emergency department. In the event of inclement weather, please call our main line at [phone] for an update on the status of any delays or  closures.  Dermatology Medication Tips: Please keep the boxes that topical medications come in in order to help keep track of the instructions about where and how to use these. Pharmacies typically print the medication instructions only on the boxes and not directly on the medication tubes.   If your medication is too expensive, please contact our office at [phone] or send [place] a message through MyChart.   We are unable to tell what your co-pay for medications will be in advance as this is different depending on your insurance coverage. However, we may be able to find a substitute medication at lower cost or fill out paperwork to get insurance to cover a needed medication.   If a prior authorization is required to get your medication covered by your insurance company, please allow [place] 1-2 business days to complete this process.  Drug prices often vary depending on where the prescription is filled and some pharmacies may offer cheaper prices.  The website www.goodrx.com contains coupons for medications through different pharmacies. The prices here do not account for what the cost may be with help from insurance (it may be cheaper with your insurance), but the website can give you the price if you did not use any insurance.  - You can print the associated coupon and take it with your prescription to the pharmacy.  - You may also stop by our office during regular business hours and pick up a GoodRx coupon card.  - If  you need your prescription sent electronically to a different pharmacy, notify our office through [facility] or by phone at [phone]    Skin Education :   I counseled the patient regarding the following: Sun screen (SPF 30 or greater) should be applied during peak UV exposure (between 10am and 2pm) and reapplied after exercise or swimming.  The ABCDEs of melanoma were reviewed with the patient, and the importance of monthly self-examination of moles was emphasized.  Should any moles change in shape or color, or itch, bleed or burn, pt will contact our office for evaluation sooner then their interval appointment.  Plan: Sunscreen Recommendations I recommended a broad spectrum sunscreen with a SPF of 30 or higher. I explained that SPF 30 sunscreens block approximately 97 percent of the sun's harmful rays. Sunscreens should be applied at least 15 minutes prior to expected sun exposure and then every 2 hours after that as long as sun exposure continues. If swimming or exercising sunscreen should be reapplied every 45 minutes to an hour after getting wet or sweating. One ounce, or the equivalent of a shot glass full of sunscreen, is adequate to protect the skin not covered by a bathing suit. I also recommended a lip balm with a sunscreen as well. Sun protective clothing can be used in lieu of sunscreen but must be worn the entire time you are exposed to the sun's rays. For areas treated with Liquid Nitrogen:  Keep clean with soap and water.  Apply Vaseline or Aquaphor twice daily.

## 2024-02-15 NOTE — Progress Notes (Unsigned)
   New Patient Visit   Subjective  Brian Malone is a 73 y.o. male who presents for the following: Skin Cancer Screening and Full Body Skin Exam.  Hx of BCC and AK.  Family hx of NMSC.  The patient presents for Total-Body Skin Exam (TBSE) for skin cancer screening and mole check. The patient has spots, moles and lesions to be evaluated, some may be new or changing.  The following portions of the chart were reviewed this encounter and updated as appropriate: medications, allergies, medical history  Review of Systems:  No other skin or systemic complaints except as noted in HPI or Assessment and Plan.  Objective  Well appearing patient in no apparent distress; mood and affect are within normal limits.  A full examination was performed including scalp, head, eyes, ears, nose, lips, neck, chest, axillae, abdomen, back, buttocks, bilateral upper extremities, bilateral lower extremities, hands, feet, fingers, toes, fingernails, and toenails. All findings within normal limits unless otherwise noted below.   Relevant physical exam findings are noted in the Assessment and Plan.    Assessment & Plan   SKIN CANCER SCREENING PERFORMED TODAY.  ACTINIC DAMAGE - Chronic condition, secondary to cumulative UV/sun exposure - diffuse scaly erythematous macules with underlying dyspigmentation - Recommend daily broad spectrum sunscreen SPF 30+ to sun-exposed areas, reapply every 2 hours as needed.  - Staying in the shade or wearing long sleeves, sun glasses (UVA+UVB protection) and wide brim hats (4-inch brim around the entire circumference of the hat) are also recommended for sun protection.  - Call for new or changing lesions.  LENTIGINES, SEBORRHEIC KERATOSES, HEMANGIOMAS - Benign normal skin lesions - Benign-appearing - Call for any changes  MELANOCYTIC NEVI - Tan-brown and/or pink-flesh-colored symmetric macules and papules - Benign appearing on exam today - Observation - Call clinic for new  or changing moles - Recommend daily use of broad spectrum spf 30+ sunscreen to sun-exposed areas.   HISTORY OF BASAL CELL CARCINOMA OF THE SKIN - No evidence of recurrence today - Recommend regular full body skin exams - Recommend daily broad spectrum sunscreen SPF 30+ to sun-exposed areas, reapply every 2 hours as needed.  - Call if any new or changing lesions are noted between office visits  AK (ACTINIC KERATOSIS) (4) Left Ear, Left Malar Cheek, Left Preauricular Area, Right Ear Destruction of lesion - Left Ear, Left Malar Cheek, Left Preauricular Area, Right Ear Complexity: simple   Destruction method: cryotherapy   Timeout:  patient name, date of birth, surgical site, and procedure verified Outcome: patient tolerated procedure well with no complications   Post-procedure details: wound care instructions given   MULTIPLE BENIGN NEVI   LENTIGINES   SEBORRHEIC KERATOSES   CHERRY ANGIOMA   ACTINIC SKIN DAMAGE   HISTORY OF BASAL CELL CARCINOMA (BCC) Left Root of Nose  Return in about 6 months (around 08/14/2024) for TBSC.  Dominga Ferry, Surg Tech III, am acting as scribe for Gwenith Daily, MD.   Documentation: I have reviewed the above documentation for accuracy and completeness, and I agree with the above.  Gwenith Daily, MD

## 2024-05-16 ENCOUNTER — Emergency Department (HOSPITAL_BASED_OUTPATIENT_CLINIC_OR_DEPARTMENT_OTHER)
Admission: EM | Admit: 2024-05-16 | Discharge: 2024-05-17 | Disposition: A | Discharge status: Home / Self Care | Attending: Emergency Medicine | Admitting: Emergency Medicine

## 2024-05-16 ENCOUNTER — Encounter (HOSPITAL_BASED_OUTPATIENT_CLINIC_OR_DEPARTMENT_OTHER): Payer: Self-pay

## 2024-05-16 ENCOUNTER — Other Ambulatory Visit: Payer: Self-pay

## 2024-05-16 ENCOUNTER — Ambulatory Visit (INDEPENDENT_AMBULATORY_CARE_PROVIDER_SITE_OTHER)

## 2024-05-16 ENCOUNTER — Ambulatory Visit (INDEPENDENT_AMBULATORY_CARE_PROVIDER_SITE_OTHER): Admitting: Podiatry

## 2024-05-16 ENCOUNTER — Emergency Department (HOSPITAL_BASED_OUTPATIENT_CLINIC_OR_DEPARTMENT_OTHER)

## 2024-05-16 DIAGNOSIS — Z8616 Personal history of COVID-19: Secondary | ICD-10-CM | POA: Insufficient documentation

## 2024-05-16 DIAGNOSIS — M722 Plantar fascial fibromatosis: Secondary | ICD-10-CM

## 2024-05-16 DIAGNOSIS — M65971 Unspecified synovitis and tenosynovitis, right ankle and foot: Secondary | ICD-10-CM | POA: Diagnosis not present

## 2024-05-16 DIAGNOSIS — M65972 Unspecified synovitis and tenosynovitis, left ankle and foot: Secondary | ICD-10-CM

## 2024-05-16 DIAGNOSIS — I2510 Atherosclerotic heart disease of native coronary artery without angina pectoris: Secondary | ICD-10-CM | POA: Insufficient documentation

## 2024-05-16 DIAGNOSIS — I10 Essential (primary) hypertension: Secondary | ICD-10-CM | POA: Insufficient documentation

## 2024-05-16 DIAGNOSIS — R002 Palpitations: Secondary | ICD-10-CM | POA: Diagnosis present

## 2024-05-16 DIAGNOSIS — M7751 Other enthesopathy of right foot: Secondary | ICD-10-CM | POA: Diagnosis not present

## 2024-05-16 DIAGNOSIS — R000 Tachycardia, unspecified: Secondary | ICD-10-CM | POA: Insufficient documentation

## 2024-05-16 DIAGNOSIS — E0843 Diabetes mellitus due to underlying condition with diabetic autonomic (poly)neuropathy: Secondary | ICD-10-CM

## 2024-05-16 DIAGNOSIS — Q667 Congenital pes cavus, unspecified foot: Secondary | ICD-10-CM

## 2024-05-16 DIAGNOSIS — M7752 Other enthesopathy of left foot: Secondary | ICD-10-CM | POA: Diagnosis not present

## 2024-05-16 DIAGNOSIS — Z21 Asymptomatic human immunodeficiency virus [HIV] infection status: Secondary | ICD-10-CM | POA: Insufficient documentation

## 2024-05-16 DIAGNOSIS — E119 Type 2 diabetes mellitus without complications: Secondary | ICD-10-CM | POA: Insufficient documentation

## 2024-05-16 LAB — COMPREHENSIVE METABOLIC PANEL WITH GFR
ALT: 29 U/L (ref 0–44)
AST: 44 U/L — ABNORMAL HIGH (ref 15–41)
Albumin: 4.6 g/dL (ref 3.5–5.0)
Alkaline Phosphatase: 126 U/L (ref 38–126)
Anion gap: 14 (ref 5–15)
BUN: 28 mg/dL — ABNORMAL HIGH (ref 8–23)
CO2: 19 mmol/L — ABNORMAL LOW (ref 22–32)
Calcium: 9.4 mg/dL (ref 8.9–10.3)
Chloride: 103 mmol/L (ref 98–111)
Creatinine, Ser: 1.46 mg/dL — ABNORMAL HIGH (ref 0.61–1.24)
GFR, Estimated: 51 mL/min — ABNORMAL LOW (ref >60)
Glucose, Bld: 282 mg/dL — ABNORMAL HIGH (ref 70–99)
Potassium: 4.4 mmol/L (ref 3.5–5.1)
Sodium: 136 mmol/L (ref 135–145)
Total Bilirubin: 0.5 mg/dL (ref 0.0–1.2)
Total Protein: 7.3 g/dL (ref 6.5–8.1)

## 2024-05-16 LAB — CBC WITH DIFFERENTIAL/PLATELET
Abs Immature Granulocytes: 0.03 10*3/uL (ref 0.00–0.07)
Basophils Absolute: 0 10*3/uL (ref 0.0–0.1)
Basophils Relative: 0 %
Eosinophils Absolute: 0 10*3/uL (ref 0.0–0.5)
Eosinophils Relative: 0 %
HCT: 39.4 % (ref 39.0–52.0)
Hemoglobin: 13.2 g/dL (ref 13.0–17.0)
Immature Granulocytes: 0 %
Lymphocytes Relative: 8 %
Lymphs Abs: 0.6 10*3/uL — ABNORMAL LOW (ref 0.7–4.0)
MCH: 30.8 pg (ref 26.0–34.0)
MCHC: 33.5 g/dL (ref 30.0–36.0)
MCV: 92.1 fL (ref 80.0–100.0)
Monocytes Absolute: 0.1 10*3/uL (ref 0.1–1.0)
Monocytes Relative: 2 %
Neutro Abs: 6.6 10*3/uL (ref 1.7–7.7)
Neutrophils Relative %: 90 %
Platelets: 168 10*3/uL (ref 150–400)
RBC: 4.28 MIL/uL (ref 4.22–5.81)
RDW: 14.1 % (ref 11.5–15.5)
WBC: 7.4 10*3/uL (ref 4.0–10.5)
nRBC: 0 % (ref 0.0–0.2)

## 2024-05-16 LAB — TROPONIN T, HIGH SENSITIVITY: Troponin T High Sensitivity: 19 ng/L — ABNORMAL HIGH (ref <19)

## 2024-05-16 LAB — MAGNESIUM: Magnesium: 1.9 mg/dL (ref 1.7–2.4)

## 2024-05-16 MED ORDER — SODIUM CHLORIDE 0.9 % IV BOLUS
1000.0000 mL | Freq: Once | INTRAVENOUS | Status: AC
  Administered 2024-05-16: 1000 mL via INTRAVENOUS

## 2024-05-16 MED ORDER — BETAMETHASONE SOD PHOS & ACET 6 (3-3) MG/ML IJ SUSP
3.0000 mg | Freq: Once | INTRAMUSCULAR | Status: AC
  Administered 2024-05-16: 3 mg via INTRA_ARTICULAR

## 2024-05-16 NOTE — Progress Notes (Signed)
   Chief Complaint  Patient presents with   [facility]   Foot Pain    RM#6 DFC/Foot pain ongoing issue worsening.     Subjective:  73 y.o. male presenting today for annual diabetic foot exam.  Patient continues to have chronic foot pain bilateral.  He tries to stay active but he is experiencing significant ankle pain as well as heel pain.   Past Medical History:  Diagnosis Date   Acute subdural hematoma (HCC) 10/28/2019   Anxiety    Arthritis    Cancer (HCC)    basal cell on nose   Cellulitis of right upper extremity 11/04/2020   Complication of anesthesia    Depression    Diabetes mellitus without complication (HCC)    Dysphagia    GERD (gastroesophageal reflux disease)    Hepatitis C    High cholesterol    History of COVID-19 05/26/2020   History of kidney stones    HIV (human immunodeficiency virus infection) (HCC)    Hypertension    Pneumonia    PONV (postoperative nausea and vomiting)    Renal disorder    Sleep apnea    does not use CPAP   Tachycardia      Objective / Physical Exam:  General:  The patient is alert and oriented x3 in no acute distress. Dermatology:  Skin is warm, dry and supple bilateral lower extremities. Negative for open lesions or macerations. Vascular:  Palpable pedal pulses bilaterally. No edema or erythema noted. Capillary refill within normal limits.  Clinically there is no concern for vascular compromise Neurological:  Light touch and protective threshold diminished bilateral Musculoskeletal Exam:  Muscle strength 5/5 all compartments.  ROM WNL.  High arches noted which likely contribute to the patient's chronic ankle pain and midfoot pain.  There is also some pain and tenderness associated to the bilateral forefoot likely with high arches being the underlying cause.  Tenderness with palpation noted to the anterior medial aspect of the bilateral ankles as well as the plantar fascia bilateral   Assessment: 1.  Encounter for diabetic foot  exam 2.  Chronic foot pain bilateral.  Metatarsalgia bilateral 3.  High arches bilateral 4.  Synovitis bilateral ankle 5.  Plantar fasciitis bilateral  Plan of Care:  -Patient was evaluated.  Comprehensive diabetic foot exam was performed today -Order placed for new custom molded Plastizote insoles with diabetic shoes to [facility] -Maintain close management with PCP for diabetes.  Currently well-controlled - Injection of 0.5 cc Celestone  Soluspan injected into the bilateral ankles as well as the plantar fascia bilateral -Return to clinic PRN  Dot Gazella, DPM [facility]  Dr. Dot Gazella, DPM    [address]                                        [place], [place] [address]                Office [phone]  Fax [phone]

## 2024-05-16 NOTE — ED Notes (Signed)
 Pt. Reports at 1630 he felt his heart rate elevate and began feeling palpatations.  Pt. Said he usually has HR of 61 range.  Pt. In no distress at present time.  Pt. Has had rate of 61-134 today  and he had Prednisone injections today approx. 4 injections.

## 2024-05-16 NOTE — ED Triage Notes (Signed)
 Pt. Reports heart has been racing since exercising this after noon. Pt. Has hx heart stent and takes daily betablocker last taken at 18:30

## 2024-05-17 DIAGNOSIS — R000 Tachycardia, unspecified: Secondary | ICD-10-CM | POA: Diagnosis not present

## 2024-05-17 LAB — TROPONIN T, HIGH SENSITIVITY: Troponin T High Sensitivity: 19 ng/L — ABNORMAL HIGH (ref <19)

## 2024-05-17 LAB — TSH: TSH: 1.236 u[IU]/mL (ref 0.350–4.500)

## 2024-05-17 NOTE — ED Provider Notes (Signed)
 [facility] Emergency Department Provider Note MRN:  [id number]  Arrival date & time: 05/17/24     Chief Complaint   Palpitations   History of Present Illness   Brian Malone is a 73 y.o. year-old male with a history of DM, CAD presenting to the ED with chief complaint of palpitations.  Worked in the yard today for a while and noticed his heart rate was higher than normal.  Went inside but the heart rate did not go back to normal.  Normally has a heart rate in the 60s, very active, averages 20,000 steps a day today heart rate up above 100 consistently.  Review of Systems  A thorough review of systems was obtained and all systems are negative except as noted in the HPI and PMH.   Patient's Health History    Past Medical History:  Diagnosis Date   Acute subdural hematoma (HCC) 10/28/2019   Anxiety    Arthritis    Cancer (HCC)    basal cell on nose   Cellulitis of right upper extremity 11/04/2020   Complication of anesthesia    Depression    Diabetes mellitus without complication (HCC)    Dysphagia    GERD (gastroesophageal reflux disease)    Hepatitis C    High cholesterol    History of COVID-19 05/26/2020   History of kidney stones    HIV (human immunodeficiency virus infection) (HCC)    Hypertension    Pneumonia    PONV (postoperative nausea and vomiting)    Renal disorder    Sleep apnea    does not use CPAP   Tachycardia     Past Surgical History:  Procedure Laterality Date   ANKLE ARTHROSCOPY     APPENDECTOMY     APPLICATION OF WOUND VAC Right 03/25/2021   Procedure: APPLICATION OF WOUND VAC;  Surgeon: Hilarie Lovely, MD;  Location: [facility] OR;  Service: Thoracic;  Laterality: Right;   BACK SURGERY     FUSION   CORONARY STENT INTERVENTION N/A 10/26/2022   Procedure: CORONARY STENT INTERVENTION;  Surgeon: Arleen Lacer, MD;  Location: [facility] INVASIVE CV LAB;  Service: Cardiovascular;  Laterality: N/A;   DEBRIDEMENT AND CLOSURE  WOUND Right 05/14/2021   Procedure: Right chest wound excision;  Surgeon: Thornell Flirt, DO;  Location: [facility] OR;  Service: Plastics;  Laterality: Right;   INCISION AND DRAINAGE Right 03/25/2021   Procedure: INCISION AND DRAINAGE OF CHEST WALL;  Surgeon: Hilarie Lovely, MD;  Location: [facility] OR;  Service: Thoracic;  Laterality: Right;   INCISION AND DRAINAGE ABSCESS Right 03/19/2021   Procedure: INCISION AND DRAINAGE RIGHT CHEST ABSCESS;  Surgeon: Hilarie Lovely, MD;  Location: [facility] OR;  Service: Vascular;  Laterality: Right;   IR US  GUIDE BX ASP/DRAIN  03/14/2021   IR US  GUIDE BX ASP/DRAIN  03/14/2021   LEFT HEART CATH AND CORONARY ANGIOGRAPHY N/A 10/26/2022   Procedure: LEFT HEART CATH AND CORONARY ANGIOGRAPHY;  Surgeon: Arleen Lacer, MD;  [facility] INVASIVE CV LAB;  Service: Cardiovascular;  Laterality: N/A;   LESION REMOVAL Left 01/09/2024   Procedure: Excision of skin lesions: left cheek, left orbital rim, left ear, left upper neck;  Surgeon: Teretha Ferguson, MD;  Location: [facility] OR;  Service: Plastics;  Laterality: Left;   PENILE PROSTHESIS  REMOVAL  05/2020   Removal due to abscess   PENILE PROSTHESIS PLACEMENT  04/2020   SKIN SPLIT GRAFT Right 05/14/2021   Procedure: possible skin graft  and Matriderm;  Surgeon: Thornell Flirt, DO;  Location: [facility];  Service: Plastics;  Laterality: Right;   SKIN SPLIT GRAFT Right 06/24/2021   Procedure: SKIN GRAFT SPLIT THICKNESS TO RIGHT CHEST;  Surgeon: Thornell Flirt, DO;  Location: [facility] OR;  Service: Plastics;  Laterality: Right;   THORACOTOMY Right 2008  ?   TONSILLECTOMY      Family History  Problem Relation Age of Onset   CAD Mother    Lung cancer Mother    CAD Father    Lung cancer Father    CAD Brother     Social History   Socioeconomic History   Marital status: Divorced    Spouse name: Not on file   Number of children: 2   Years of education: Not on file   Highest education level: Bachelor's degree (e.g., BA, AB,  BS)  Occupational History   Not on file  Tobacco Use   Smoking status: Never   Smokeless tobacco: Never  Vaping Use   Vaping status: Never Used  Substance and Sexual Activity   Alcohol  use: Not Currently   Drug use: Never   Sexual activity: Not on file  Other Topics Concern   Not on file  Social History Narrative   Pt is divorced   2 children   Right handed   Drinks soda, no coffee, no tea    Lives on the third floor of an apartment bldg. One story studio apartment   Social Drivers of Health   Financial Resource Strain: Not on file  Food Insecurity: Low Risk  (11/07/2023)   [facility]   Hunger Vital Sign    Worried About Running Out of Food in the Last Year: Never true    Ran Out of Food in the Last Year: Never true  Transportation Needs: No Transportation Needs (11/07/2023)   [facility]   Transportation    In the past 12 months, has lack of reliable transportation kept you from medical appointments, meetings, work or from getting things needed for daily living? : No  Physical Activity: Not on file  Stress: Not on file  Social Connections: Not on file  Intimate Partner Violence: Not on file     Physical Exam   Vitals:   05/17/24 0234 05/17/24 0300  BP: 116/62 124/71  Pulse: 81 77  Resp: (!) 22 15  Temp: 97.9 F (36.6 C)   SpO2: 99% 98%    CONSTITUTIONAL: Well-appearing, NAD NEURO/PSYCH:  Alert and oriented x 3, no focal deficits EYES:  eyes equal and reactive ENT/NECK:  no LAD, no JVD CARDIO: Regular rate, well-perfused, normal S1 and S2 PULM:  CTAB no wheezing or rhonchi GI/GU:  non-distended, non-tender MSK/SPINE:  No gross deformities, no edema SKIN:  no rash, atraumatic   *Additional and/or pertinent findings included in MDM below  Diagnostic and Interventional Summary    EKG Interpretation Date/Time:  Wednesday May 16 2024 22:14:28 EDT Ventricular Rate:  114 PR Interval:  147 QRS  Duration:  94 QT Interval:  346 QTC Calculation: 477 R Axis:   15  Text Interpretation: Sinus tachycardia Minimal ST depression, diffuse leads Borderline prolonged QT interval Confirmed by Gwenetta Lennert [phone]) on 05/16/2024 11:13:03 PM       Labs Reviewed  COMPREHENSIVE METABOLIC PANEL WITH GFR - Abnormal; Notable for the following components:      Result Value   CO2 19 (*)    Glucose, Bld 282 (*)  BUN 28 (*)    Creatinine, Ser 1.46 (*)    AST 44 (*)    GFR, Estimated 51 (*)    All other components within normal limits  CBC WITH DIFFERENTIAL/PLATELET - Abnormal; Notable for the following components:   Lymphs Abs 0.6 (*)    All other components within normal limits  TROPONIN T, HIGH SENSITIVITY - Abnormal; Notable for the following components:   Troponin T High Sensitivity 19 (*)    All other components within normal limits  TROPONIN T, HIGH SENSITIVITY - Abnormal; Notable for the following components:   Troponin T High Sensitivity 19 (*)    All other components within normal limits  MAGNESIUM   TSH    DG Chest Port 1 View  Final Result      Medications  sodium chloride  0.9 % bolus 1,000 mL (0 mLs Intravenous Stopped 05/17/24 0025)     Procedures  /  Critical Care Procedures  ED Course and Medical Decision Making  Initial Impression and Ddx Tachycardia, no chest pain or shortness of breath.  No leg pain or swelling on exam.  No acute distress, lungs clear.  Had some steroid shots today for plantar fasciitis, could be having side effect.  Also likely dehydrated given his significant activity levels.  Doubt PE, doubt ACS  Past medical/surgical history that increases complexity of ED encounter: CAD  Interpretation of Diagnostics I personally reviewed the EKG and my interpretation is as follows: Sinus tachycardia  No significant blood count or electrolyte disturbance.  Troponin minimally elevated and flat upon repeat  Patient Reassessment and Ultimate  Disposition/Management     With liter of fluids patient's heart rate improves to the 70s, he feels better.  Appropriate for discharge with PCP follow-up.  Patient management required discussion with the following services or consulting groups:  None  Complexity of Problems Addressed Acute illness or injury that poses threat of life of bodily function  Additional Data Reviewed and Analyzed Further history obtained from: Further history from spouse/family member  Additional Factors Impacting ED Encounter Risk Consideration of hospitalization  Merrick Abe. Harless Lien, MD [facility] Emergency Medicine [facility] [email or web address]  Final Clinical Impressions(s) / ED Diagnoses     ICD-10-CM   1. Tachycardia  R00.0       ED Discharge Orders     None        Discharge Instructions Discussed with and Provided to Patient:     Discharge Instructions      You were evaluated in the Emergency Department and after careful evaluation, we did not find any emergent condition requiring admission or further testing in the hospital.  Your exam/testing today is overall reassuring.  Symptoms could be related to either dehydration or the new steroids.  Recommend increased fluid at home, close follow-up with your primary care doctor.  Please return to the Emergency Department if you experience any worsening of your condition.   Thank you for allowing us  to be a part of your care.      Edson Graces, MD 05/17/24 [phone]

## 2024-05-17 NOTE — Discharge Instructions (Signed)
 You were evaluated in the Emergency Department and after careful evaluation, we did not find any emergent condition requiring admission or further testing in the hospital.  Your exam/testing today is overall reassuring.  Symptoms could be related to either dehydration or the new steroids.  Recommend increased fluid at home, close follow-up with your primary care doctor.  Please return to the Emergency Department if you experience any worsening of your condition.   Thank you for allowing us  to be a part of your care.

## 2024-05-28 ENCOUNTER — Other Ambulatory Visit: Payer: Self-pay | Admitting: Podiatry

## 2024-05-28 DIAGNOSIS — E0843 Diabetes mellitus due to underlying condition with diabetic autonomic (poly)neuropathy: Secondary | ICD-10-CM

## 2024-05-28 DIAGNOSIS — Q667 Congenital pes cavus, unspecified foot: Secondary | ICD-10-CM

## 2024-05-28 DIAGNOSIS — M7741 Metatarsalgia, right foot: Secondary | ICD-10-CM

## 2024-07-16 ENCOUNTER — Encounter: Payer: Self-pay | Admitting: Internal Medicine

## 2024-08-07 ENCOUNTER — Telehealth: Payer: Self-pay | Admitting: *Deleted

## 2024-08-07 NOTE — Telephone Encounter (Signed)
   Name: Brian Malone  DOB: [birth year]  MRN: [id number]  Primary Cardiologist: Soyla DELENA Merck, MD  Chart reviewed as part of pre-operative protocol coverage. The patient has an upcoming visit scheduled with Hao Meng, PA on 09/07/2024 at which time clearance can be addressed in case there are any issues that would impact surgical recommendations.  Colonoscopy is not scheduled until TBD as below. I added preop FYI to appointment note so that provider is aware to address at time of outpatient visit.  Per office protocol the cardiology provider should forward their finalized clearance decision and recommendations regarding antiplatelet therapy to the requesting party below.    Per office protocol, he may hold Plavix  for 5 days prior to procedure and should resume as soon as hemodynamically stable postoperatively. Patient should take aspirin  81 mg daily if not contraindicated from a bleeding standpoint.  He will stop aspirin  with resumption of Plavix .    I will route this message as FYI to requesting party and remove this message from the preop box as separate preop APP input not needed at this time.   Please call with any questions.  Lum LITTIE Louis, NP  08/07/2024, 4:53 PM

## 2024-08-07 NOTE — Telephone Encounter (Signed)
   Pre-operative Risk Assessment    Patient Name: Brian Malone  DOB: [birth year] MRN: [id number]   Date of last office visit: 09/15/23 DR. ACHARYA Date of next office visit: 09/07/24 HAO MENG, [facility]   Request for Surgical Clearance    Procedure:  COLONOSCOPY  Date of Surgery:  Clearance TBD                                Surgeon:  DR. BADREDDINE Surgeon's Group or Practice Name:  [facility] Phone number:  [phone] Fax number:  [phone]   Type of Clearance Requested:   - Medical  - Pharmacy:  Hold Clopidogrel  (Plavix ) x 5-7 DAYS   Type of Anesthesia:  Not Indicated (PROPOFOL ?)   Additional requests/questions:    [name]   08/07/2024, 11:30 AM

## 2024-08-14 ENCOUNTER — Ambulatory Visit (INDEPENDENT_AMBULATORY_CARE_PROVIDER_SITE_OTHER): Payer: Medicare Other | Admitting: Dermatology

## 2024-08-14 ENCOUNTER — Encounter: Payer: Self-pay | Admitting: Dermatology

## 2024-08-14 VITALS — BP 154/80 | HR 65

## 2024-08-14 DIAGNOSIS — W908XXA Exposure to other nonionizing radiation, initial encounter: Secondary | ICD-10-CM

## 2024-08-14 DIAGNOSIS — L570 Actinic keratosis: Secondary | ICD-10-CM

## 2024-08-14 DIAGNOSIS — Z85828 Personal history of other malignant neoplasm of skin: Secondary | ICD-10-CM | POA: Diagnosis not present

## 2024-08-14 NOTE — Progress Notes (Signed)
   Follow-Up Visit   Subjective  Will Brian Malone is a 73 y.o. male who presents for the following: follow up after AK treatment. He states that he feels like the Aks previously treated resolved.   The following portions of the chart were reviewed this encounter and updated as appropriate: medications, allergies, medical history  Review of Systems:  No other skin or systemic complaints except as noted in HPI or Assessment and Plan.  Objective  Well appearing patient in no apparent distress; mood and affect are within normal limits.  A focused examination was performed of the following areas: Bilateral ears and face  Relevant exam findings are noted in the Assessment and Plan.  Right Ear Erythematous thin papules/macules with gritty scale.   Assessment & Plan  ACTINIC KERATOSIS Exam: Erythematous thin papules/macules with gritty scale at the left and right ear, left malar cheek, left preauricular  Actinic keratoses are precancerous spots that appear secondary to cumulative UV radiation exposure/sun exposure over time. They are chronic with expected duration over 1 year. A portion of actinic keratoses will progress to squamous cell carcinoma of the skin. It is not possible to reliably predict which spots will progress to skin cancer and so treatment is recommended to prevent development of skin cancer.  Recommend daily broad spectrum sunscreen SPF 30+ to sun-exposed areas, reapply every 2 hours as needed.  Recommend staying in the shade or wearing long sleeves, sun glasses (UVA+UVB protection) and wide brim hats (4-inch brim around the entire circumference of the hat). Call for new or changing lesions.  Treatment Plan: Previously treated with Cryo on 02/15/24   HISTORY OF BASAL CELL CARCINOMA OF THE SKIN - No evidence of recurrence today - Recommend regular full body skin exams - Recommend daily broad spectrum sunscreen SPF 30+ to sun-exposed areas, reapply every 2 hours as needed.   - Call if any new or changing lesions are noted between office visits   AK (ACTINIC KERATOSIS) Right Ear Destruction of lesion - Right Ear Complexity: simple   Destruction method: cryotherapy   Informed consent: discussed and consent obtained   Outcome: patient tolerated procedure well with no complications   Post-procedure details: wound care instructions given    HISTORY OF BASAL CELL CARCINOMA (BCC)    Return in about 6 months (around 02/14/2025) for TBSE .  I, Berwyn Lesches, Surg Tech III, am acting as scribe for RUFUS CHRISTELLA HOLY, MD.   Documentation: I have reviewed the above documentation for accuracy and completeness, and I agree with the above.  RUFUS CHRISTELLA HOLY, MD

## 2024-08-27 NOTE — Progress Notes (Signed)
 [address] - AMBULATORY [facility]  - INTERNAL MEDICINE [place] [id number] [place] [address]   August 27, 2024  Patient ID: Brian Malone is a 73 y.o. male   Assessment/Plan:    Brian Malone was seen today for medicare annual wellness visit subsequent, diabetes, hyperlipidemia, hypertension and back pain.  Diagnoses and all orders for this visit:  Type 2 diabetes mellitus with stage 3a chronic kidney disease, without long-term current use of insulin     (CMD) Chronic, well-controlled on Ozempic.  Continue current management.  Check A1c and  renal function.  Continue to work on diet and exercise.  Orders: -     Comprehensive Metabolic Panel; Future -     Hemoglobin A1C With Estimated Average Glucose; Future  Essential (primary) hypertension Chronic, well-controlled on propranolol .  Continue current management.  Orders: -     CBC with Differential; Future -     Comprehensive Metabolic Panel; Future -     TSH With Reflex To Free T4; Future  Mixed hyperlipidemia Chronic, stable. ASCVD risk reviewed.  LDL goal less than 70.  Continue current management. Educated on lifestyle modifications with heart healthy diet, exercise and weight loss. Check Lipid Panel today.  Orders: -     Lipid Panel With Reflex Direct LDL; Future  Prostate cancer screening -     PSA, Total (Screen); Future  Lumbar radiculopathy Signs and symptoms consistent with radiculopathy.  He has some significant damage throughout the lumbar spine.  Malone give a dose of prednisone, avoiding NSAIDs due to CKD.  Recommend physical therapy and Malone obtain new lumbar x-ray.  Orders: -     predniSONE (DELTASONE) 20 mg tablet; Take 2 tablets (40 mg total) by mouth daily. -     XR Spine Lumbar 2-3 Views; Future -     Ambulatory referral to Physical Therapy; Future  Encounter for Medicare annual wellness exam Overall health fair Colon Cancer Screening: Due  awaiting cardiac clearance. Prostate Screening: educated about risk and benefits of screening with PSA. See orders Bone Density: educated about weight bearing exercise and proper doses of calcium /Vitamin D Vaccines: TdaP, Pneumonia, Shingles. Educated about seasonal vaccines - declined today. Counseling: exercise, diet, sun exposure/skin protection and weight loss   Need for hepatitis B screening test -     Hepatitis B Surface Antibody, Quantitative; Future   Patient verbalizes understanding and in agreement with the above plan. All questions answered.  Medication side effects discussed with patient. Advised patient to call clinic or return for visit if symptoms occur. Goals of care discussed with patient including med compliance and adequate follow up.  Return in 6 months (on 02/24/2025) for DM, HTN, HLD .  Subjective:   Chief Complaint  Patient presents with  . Medicare Annual Wellness Visit Subsequent  . Diabetes  . Hyperlipidemia  . Hypertension  . Back Pain     HPI: Brian Malone presents today for his annual wellness and follow up in chronic conditions.   Health care maintenance Colonoscopy: waiting on Cardio to give ok to come off plavix  AAA: 6//152021 Tobacco: No ETOH: No Skin: Derm app yearly. Wear sunscreen: occasionally  Dental: Cleaning every 6 months.  Eye Exam: yearly  Hearing concerns: No  Regarding DM  Patient was last seen 02/22/24 Currently on Ozempic 0.5mg  weekly and is taking medications as prescribed  Fasting glucose ranging not checking Patient is tolerating medications well.  Patient is exercising  Patient  does follow low carb diet  Denies hypoglycemia Last eye exam: 09/02/23 Last foot exam: 09/12/23 Last A1C: 6.0 Denies any new symptoms such as vision changes, neuropathic pain, polydipsia or polyuria.   Regarding HTN  Currently on Propranolol  60mg  daily  and is taking medications as prescribed  Patient is tolerating medications well.   Patient is exercising 7 x a week. Patient  does follow low salt diet.  Patient is checking blood pressure daily. BP around systolic 115-126 and diastolic 70s. Patient denies chest pain, sob, nausea, vomiting, dizziness, cough, abdominal pain, headache.  Regarding hyperlipidemia. Patient taking Zetia  10mg  daily, Crestor  20mg  daily as prescribed. No SE noted of medication.  Patient denies myalgias and joint pain.  Complains of lower back pain x 1 month. Reports pain radiates down left leg. Pain is dull/achy, aggravated with standing/walking.  He reports tingling and numbness sensation going down his left lower extremity.  Pain is not constant but is frequent. Has been taking tylenol  for pain. Reports he is doing stretching exercises.   Immunizations by Immunization Family     Covid-19 Vaccine Unspecified 03/07/2020 (73 y.o.) 12/09/2020 (73 y.o.) 09/11/2021 (73 y.o.)    Influenza, High-dose Seasonal, Quadrivalent, Preservative Free 09/22/2022 (73 y.o.)      Influenza, Injectable, Mdck, Preservative Free,quadrivalent 10/06/2018 (73 y.o.)      Influenza, Injectable, Quadrivalent, Preservative Free 10/24/2020 (73 y.o.)      Influenza, Injectable, Quadrivalent, With Preservative 11/22/2014 (73 y.o.) 10/24/2020 (73 y.o.)     Influenza, Unspecified 11/22/2014 (73 y.o.) 11/15/2017 (73 y.o.) 10/10/2018 (73 y.o.) 10/24/2020 (73 y.o.)    09/22/2022 (73 y.o.)      Influenza, high-dose, trivalent, PF 09/23/2018 (73 y.o.) 10/08/2019 (73 y.o.) 09/26/2023 (73 y.o.)    Influenza, split virus, trivalent, preservative 11/22/2014 (73 y.o.)      MMR 08/06/1992 (73 y.o.)      Meningococcal 4-valent IM (MENVEO) 08/01/2023 (73 y.o.)      Meningococcal MCV4P 07/11/2017 (73 y.o.) 12/12/2017 (73 y.o.)     Moderna Covid-19, mRNA,LNP-S,PF 12+ Yrs 09/22/2022 (73 y.o.) 09/26/2023 (73 y.o.)     Moderna SARS-CoV-2 Bivalent Booster 6+ yrs 09/11/2021 (73 y.o.)      Pfizer SARS-CoV-2 Primary Series 12+ yrs 12/09/2020 (73 y.o.)       Pneumococcal Conjugate 13-Valent 03/24/2015 (73 y.o.)      Pneumococcal Conjugate Vaccine 20-Valent (PREVNAR-20) 6 wks+ 08/01/2023 (73 y.o.)      Pneumococcal Polysaccharide Vaccine, 23 Valent (PNEUMOVAX-23) 2Y+ 07/21/2018 (73 y.o.)      TD (Adult), 2 Lf Tetanus Toxoid, Preservative Free 08/06/1992 (73 y.o.)      TD vaccine (ADULT)PF(TENIVAC) 7Y+ 08/06/1992 (73 y.o.)      TDAP VACCINE (BOOSTRIX ,ADACEL) 7Y+ 10/26/2019 (73 y.o.)      Varicella Zoster [facility]) 18Y+ 12/18/2021 (73 y.o.) 02/18/2022 (73 y.o.)            ROS   A complete ROS was performed with pertinent positives/negatives noted in the HPI. The remainder of the ROS are negative.   Physical Exam:   Vital Signs  Vitals:   08/27/24 1020  BP: 120/72  BP Location: Left arm  Patient Position: Sitting  Pulse: 78  Resp: 16  SpO2: 99%  Weight: 85.3 kg (188 lb)  Height: 1.829 m (6')    Constitutional: Well-developed and well-nourished. Sitting comfortably conversing normally. Pleasant.  Eyes: Conjunctivae clear and pink, no pallor, EOM intact. Pupils are equal, round, and reactive to light. Anicteric. Ears, Mouth, Nose:  External and internal auditory canals are normal. Tympanic  membranes with normal light reflex without erythema. Normal nasal mucosa.  Oropharynx is normal, no erythema or exudates, uvula midline.  Lymphatics: There is no palpable cervical or supraclavicular adenopathy.  Neck: Supple. Non-tender thyroid , no thyromegaly. No JVD. Cardiovascular: +S1S2, regular rate and rhythm, no murmurs, gallops or rubs appreciated.  Respiratory: Normal effort, clear to auscultation bilaterally. No wheezes, rales or rhonchi noted.  Gastrointestinal: Bowel sounds present and normal. Soft and nontender to palpation. There is no hepatosplenomegaly. No rebound, guarding or fluid wave. Musculoskeletal: Left lower extremity, straight leg test positive.  There is decrease in sensation throughout the left lower extremity, consistent with  sciatic pattern.  With toe extension.  Strength with hip extension diminished.  Range of motion of the hip is intact. Extremities: No cyanosis, clubbing or edema. Pulses 2+ all 4 extremities. Back: No CVA tenderness bilaterally. Neuro: Cranial nerves II-XII grossly intact.  Skin: Skin is warm and dry. No rash noted. Psychiatric: Behavior is Cooperative and Polite. Mood euthymyic. Affect is appropriate.    Medical History    Health Maintenance Due  Topic Date Due  . Colorectal Cancer Screening  09/13/2024    Medications Current Medications[1]  Allergies Adhesive, Cyclobenzaprine , Duloxetine  hcl, Linezolid , Morphine, and Pregabalin  Medical History Problem List[2]  Surgical History Surgical History[3]  Social History Social History   Socioeconomic History  . Marital status: Divorced    Spouse name: Not on file  . Number of children: Not on file  . Years of education: 25  . Highest education level: Not on file  Occupational History  . Not on file  Tobacco Use  . Smoking status: Never  . Smokeless tobacco: Never  Vaping Use  . Vaping status: Never Used  Substance and Sexual Activity  . Alcohol  use: Not Currently  . Drug use: Not Currently  . Sexual activity: Not Currently    Partners: Female, Choose not to disclose  Other Topics Concern  . Not on file  Social History Narrative   ** Merged History Encounter **       Born in [place]  and raised in [place] , Mr. Decatur reported that he has lived in [place]  for the past thiry years. The third of three children, Mr. Gamel noted that he has a brother ten years his senior who lives in [place]  and his other brot    her died at age 34.     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 Having surrendered his licensed 2010 after a charge was ma   de  that he violated a patient, he is now  retired.    Married once for thirty-four years, Mr. Talerico was divorced in 2012. He indicated having no contact with his ex-wife, two grown children, or four grandchildren. Living in a studio apartment, he said    he  has a couple people in his life but no romantic involvement. One is a male Agricultural consultant he met through [employer] who has taken a special interest in him. The other is a woman like a daughter to him that he tries to help out and someti   mes al lows to stay at his apartment. Describing himself as [place], Mr. Washam said he evolved from Protestant to Catholic to [employer]. Whereas he used to attend church when he lived in [place], he said he has not attended church since m  oving to  [place].     Updated 08/31/2019   Social Drivers of Health   Food Insecurity: Low Risk  (11/07/2023)   Food vital sign   . Within the past 12 months, you worried that your food would run out before you got money to buy more: Never true   . Within the past 12 months, the food you bought just didn't last and you didn't have money to get more: Never true  Transportation Needs: No Transportation Needs (11/07/2023)   Transportation   . In the past 12 months, has lack of reliable transportation kept you from medical appointments, meetings, work or from getting things needed for daily living? : No  Safety: Low Risk  (03/14/2024)   Safety   . How often does anyone, including family and friends, physically hurt you?: Never   . How often does anyone, including family and friends, insult or talk down to you?: Never   . How often does anyone, including family and friends, threaten you with harm?: Never   . How often does anyone, including family and friends, scream or curse at you?: Never  Living Situation: Low Risk  (11/07/2023)   Living Situation   . What is your living situation today?: I have a steady place to live   . Think about the place you live. Do you have problems with  any of the following? Choose all that apply:: None/None on this list    Family History Family History[4]   This document serves as a record of services personally performed by Dr. Nikki.  It was created on their behalf by Delon Macario Fairly, LPN, a trained medical scribe, and Licensed Practical Nurse (LPN). During the course of documenting the history, physical exam and medical decision making, I was functioning as a Stage manager. The creation of this record is the provider's dictation and/or activities during the visit.  Electronically signed by Delon Macario Fairly, LPN 0/12/7972 2:90 AM  I agree the documentation is accurate and complete.  Aliene Nikki, MD  Note - This document was created using the aid of voice recognition Dragon dictation software.    [facility]  - INTERNAL MEDICINE [place] Medicare Wellness Visit Type:: Subsequent Annual Wellness Visit  Name: Brian Malone Date of Birth: 05/23/1951 Age: 73 y.o. MRN: [id number] Visit Date: 08/27/2024  History obtained from: patient  Living Arrangements/Support System/Health Assessment/Pain/Stress Marital status: single Number of children: 2 Occupation: retired Living arrangements: (!) lives alone Does the patient have a support system (family, friend, church, Conservation officer, nature, etc)?: Yes   Do you have any dental concerns?: No In the past month, have you experienced a change in your bladder control?: No   Do you have any difficulty obtaining your medications?: No   Do you have trouble consistently taking or remembering to take all of your medications as prescribed?: No Patient rates overall stress level as: Some Does stress affect daily life?: No Typical amount of pain: (!) a lot Does pain affect daily life?: (!) Yes Are you currently prescribed opioids?: No     Depression Screening  Behavioral Health Screening  Patient Health Questionnaire-2 Score: 0 (08/27/2024 10:14 AM)      Patient's  Depression screening/score = Negative     Social History (Tobacco/Drugs/Sexual Activity) Brian Malone reports that he has never smoked. He has never used smokeless tobacco. Tobacco Use?: No How many times in the past year have you used a recreational drug or used a prescription medication for nonmedical  reasons?: None Risk factors for sexually transmitted infections (i.e., multiple sexual partners): No Are you bothered by sexual problems?: No  Alcohol  Screening How often do you have a drink containing alcohol ?: Never How many standard drinks containing alcohol  do you have on a typical day?: Never, 1 or 2 drinks How often do you have six or more drinks on one occasion?: Never Audit-C Score: 0  Physical Activity Regular exercise?: Yes Exercise frequency (times per week): 7 Exercise intensity: light (like slow walking)  Diet How many meals a day?: 2 Eats fruit and vegetables daily?: Yes Most meals are obtained by: preparing their own meals, eating out  Home and Transportation Safety All rugs have non-skid backing?: Yes All stairs or steps have railings?: Yes Grab bars in the bathtub or shower?: Yes Have non-skid surface in bathtub or shower?: Yes Good home lighting?: Yes Regular seat belt use?: Yes      Activities of Daily Living Feed self?: Yes Bathe self?: Yes Dress self?: Yes Use toilet without assistance?: Yes Walk without assistance?: Yes    Instrumental Activities of Daily Living Manage finances?: Yes Shop for themselves?: Yes Prepare meals?: Yes Use the telephone?: Yes Manage medications?: Yes   Performs basic housework/laundry?: Yes Drives?: Yes Primary transportation is: driving  Hearing Concerns about hearing?: No Uses hearing aids?: (!) Yes Hear whispered voice? (Observed): Yes  Vision Concerns about vision?: No Vision exam performed?: Yes  Fall Risk Is the patient ambulatory?: Yes One or more falls in the last year:: No Feels unsteady when  walking:: No  Cognitive Assessment Has a diagnosis of dementia or cognitive impairment?: No Are there any memory concerns by the patient, others, or providers?: No              Advance Directives Living Malone?: (!) No Advance directive information provided to patient: Yes Healthcare POA?: (!) No       Who is your in case of emergency contact?: Brian Malone,Brian Malone Relationship to patient: Sibling Emergency contact's phone number: [phone]   Other History I reviewed and updated the following risk factors and conditions as appropriate: Reviewed/Updated: Problem List, Medical History, Surgical History, Family History, Medications, Allergies Reviewed/Updated: Vital Signs (height, weight, and BP), Immunizations, Health Maintenance Patient Care Team Updated: Done  Vital Signs BP 120/72 (BP Location: Left arm, Patient Position: Sitting)   Pulse 78   Resp 16   Ht 1.829 m (6')   Wt 85.3 kg (188 lb)   SpO2 99%   BMI 25.50 kg/m   Screening and Immunizations Health Maintenance Status       Date Due Completion Dates   Hepatitis A Vaccines (1 of 2 - Risk 2-dose series) Never done ---   Adult RSV (60+ Years or Pregnancy) (1 - Risk 60-74 years 1-dose series) Never done ---   Hepatitis B Vaccines (1 of 3 - Risk 3-dose series) Never done ---   Chlamydia and Gonorrhea Screening 10/28/2022 10/28/2021, 11/22/2014   HIV: Serum RPR 01/25/2024 01/24/2023 (Done)   COVID-19 Vaccine (8 - 2024-25 season) 08/20/2024 09/26/2023, 09/22/2022   Influenza Vaccine (1) 07/20/2024 09/26/2023, 09/22/2022   Diabetes: Foot Exam 09/11/2024 09/12/2023, 05/04/2022 (Done)   Comment on 05/04/2022: See [facility]   Colorectal Cancer Screening 09/13/2024 09/14/2019, 09/14/2019   Diabetes:  Quantitative uACR for Kidney Evaluation 02/21/2025 02/22/2024   Diabetes:  eGFR for Kidney Evaluation 08/27/2025 08/27/2024, 08/06/2024   Comprehensive Annual Visit 08/27/2025 08/27/2024, 09/12/2023   Medicare Annual Wellness (AWV) Subsequent  Visits 08/27/2025 08/27/2024, 08/27/2024   Diabetes:  Hemoglobin A1C 08/27/2025 08/27/2024, 02/22/2024   Depression Monitoring 08/27/2025 08/27/2024   Diabetes: Retinopathy Screening Combo 09/01/2025 09/02/2023, 09/02/2023   Meningococcal Conjugate (ACWY) Vaccine (4 - Risk 2-dose series) 07/31/2028 08/01/2023, 12/12/2017   DTaP/Tdap/Td Vaccines (2 - Td or Tdap) 10/25/2029 10/26/2019, 08/06/1992       Immunization History  Administered Date(s) Administered  . Influenza, High-dose Seasonal, Quadrivalent, Preservative Free 09/22/2022  . Influenza, Injectable, Mdck, Preservative Free,quadrivalent 10/06/2018  . Influenza, Injectable, Quadrivalent, Preservative Free 10/24/2020  . Influenza, Injectable, Quadrivalent, With Preservative 11/22/2014, 10/24/2020  . Influenza, Unspecified 11/22/2014, 11/15/2017, 10/10/2018, 10/24/2020, 09/22/2022  . Influenza, high-dose, trivalent, PF 09/23/2018, 10/08/2019, 09/26/2023  . Influenza, split virus, trivalent, preservative 11/22/2014  . Janssen Sars-CoV-2 Vaccination 03/07/2020  . MMR 08/06/1992  . Meningococcal 4-valent IM (MENVEO) 08/01/2023  . Meningococcal MCV4P 07/11/2017, 12/12/2017  . Moderna Covid-19, mRNA,LNP-S,PF 12+ Yrs 09/22/2022, 09/26/2023  . Moderna SARS-CoV-2 Bivalent Booster 6+ yrs 09/11/2021  . Moderna SARS-CoV-2 Primary Series 12+ yrs 09/11/2021  . Pfizer SARS-CoV-2 Primary Series 12+ yrs 12/09/2020, 12/09/2020  . Pneumococcal Conjugate 13-Valent 03/24/2015  . Pneumococcal Conjugate Vaccine 20-Valent (PREVNAR-20) 6 wks+ 08/01/2023  . Pneumococcal Polysaccharide Vaccine, 23 Valent (PNEUMOVAX-23) 2Y+ 07/21/2018  . TD (Adult), 2 Lf Tetanus Toxoid, Preservative Free 08/06/1992  . TD vaccine (ADULT)PF(TENIVAC) 7Y+ 08/06/1992  . TDAP VACCINE (BOOSTRIX ,ADACEL) 7Y+ 10/26/2019  . Varicella Zoster [facility]) 18Y+ 12/18/2021, 02/18/2022    Assessment/Plan: Subsequent Annual Wellness Visit: The topics above were reviewed with the patient.  Healthy  lifestyle principles reviewed.  Recommendations provided when indicated.  Follow up 1 year for next wellness visit.  Orders Placed This Encounter  Procedures  . XR Spine Lumbar 2-3 Views  . Lipid Panel With Reflex Direct LDL  . CBC with Differential  . Comprehensive Metabolic Panel  . TSH With Reflex To Free T4  . PSA, Total (Screen)  . Hemoglobin A1C With Estimated Average Glucose  . Hepatitis B Surface Antibody, Quantitative  . Ambulatory referral to Physical Therapy    New Medications Ordered This Visit  Medications  . predniSONE (DELTASONE) 20 mg tablet    Sig: Take 2 tablets (40 mg total) by mouth daily.    Dispense:  10 tablet    Refill:  0    Patient Care Team: Aliene Nikki Rams, MD as PCP - General (Internal Medicine) Aliene Nikki Rams, MD as PCP - Attributed Orland Haws, MD (Inactive)  Electronically signed by: Aliene Nikki, MD 08/27/2024 5:27 PM       [1] Current Outpatient Medications  Medication Sig Dispense Refill  . abacavir -dolutegravir -lamiVUDine  (Triumeq ) 600-50-300 mg per tablet Take 1 tablet by mouth daily. 30 tablet 5  . acetaminophen  (TYLENOL ) 500 mg tablet Take 1,000 mg by mouth daily as needed.    . busPIRone  (BUSPAR ) 30 mg tablet Take 1/2 to1 in the morning and 1 at night 60 tablet 5  . clonazePAM  (KlonoPIN ) 0.5 mg tablet Take 1 tab qid prn 120 tablet 5  . clopidogreL  (PLAVIX ) 75 mg tablet Take 75 mg by mouth Once Daily.    . docusate sodium  (COLACE) 250 mg capsule Take 250 mg by mouth daily.    . ezetimibe  (ZETIA ) 10 mg tablet TAKE 1 TABLET BY MOUTH DAILY 90 tablet 1  . finasteride (PROSCAR) 5 mg tablet Take 1 tablet (5 mg total) by mouth daily. 90 tablet 3  . fluticasone  propionate (FLONASE ) 50 mcg/spray nasal spray USE 1 SPRAY NASALLY ONCE DAILY. 16 g 2  . glucose blood (Accu-Chek Guide test strips) test strip  Use to check sugar once per day in the morning. 100 strip 3  . glucose intolerance nutritional shake (Glucerna Shake) liqd liquid  Take 237 mL by mouth.    . hydroxypropyl methylcellulose (Goniotaire) 2.5 % ophthalmic solution Administer 1 drop into affected eye(s) as needed.    . Lancets misc Use to check sugar once per day. 100 each 3  . loratadine  (CLARITIN ) 10 mg tablet Take 10 mg by mouth Once Daily.    . nitroglycerin  (NITROSTAT ) 0.4 mg SL tablet Place 0.4 mg under the tongue.    . ondansetron  (ZOFRAN -ODT) 8 mg disintegrating tablet Take 8 mg by mouth every 8 (eight) hours as needed. 20 tablet 0  . Ozempic 0.25 mg or 0.5 mg (2 mg/3 mL) subcutaneous pen injector INJECT 0.5 MG INTO THE SKIN ONCE A WEEK 3 mL 3  . pantoprazole  (PROTONIX ) 40 mg EC tablet TAKE 1 TABLET(40 MG) BY MOUTH DAILY 90 tablet 1  . polyethylene glycol (MIRALAX ) 17 gram powd powder Take  by mouth.    . propranoloL  (INDERAL  LA) 60 mg 24 hr capsule TAKE 1 CAPSULE(60 MG) BY MOUTH DAILY 90 capsule 1  . rosuvastatin  (CRESTOR ) 20 mg tablet TAKE 1 TABLET(20 MG) BY MOUTH AT BEDTIME 90 tablet 1  . tamsulosin  (FLOMAX ) 0.4 mg cap TAKE 1 CAPSULE BY MOUTH AT BEDTIME 90 capsule 0  . predniSONE (DELTASONE) 20 mg tablet Take 2 tablets (40 mg total) by mouth daily. 10 tablet 0   No current facility-administered medications for this visit.  [2] Patient Active Problem List Diagnosis  . Post-traumatic stress disorder, unspecified  . Major depressive disorder, recurrent, mild (HCC)  . Human immunodeficiency virus (HIV) disease (HCC)  . Insomnia  . Nocturia  . Neuropathy of both feet  . Diabetic autonomic neuropathy associated with type 2 diabetes mellitus (HCC)  . Migraine with aura and without status migrainosus, not intractable  . Neck pain  . Chronic bilateral low back pain with bilateral sciatica  . Post-traumatic osteoarthritis of both ankles  . Trigeminal neuralgia  . Essential hypertension  . Tachycardia  . Lumbar spondylosis  . Cervical spinal stenosis  . CKD stage 3 due to type 2 diabetes mellitus (HCC)  . S/P partial lobectomy of lung  . Dysphagia   . Normal anion gap metabolic acidosis  . Status post spinal surgery  . Snoring  . Excessive daytime sleepiness  . Peyronie's disease  . History of urinary stone  . Benign non-nodular prostatic hyperplasia with lower urinary tract symptoms  . Gout  . GERD (gastroesophageal reflux disease)  . Arthropathy of lumbar facet joint  . Increased frequency of urination  . Urinary urgency  . Obstructive sleep apnea (adult) (pediatric)  . Other male erectile dysfunction  . Hyperlipidemia LDL goal <130  . CAD (coronary artery disease)  . Dizziness  . Radial nerve palsy, right  . Generalized anxiety disorder  . Post-thoracotomy pain  . Multiple closed fractures of ribs of right side  . Abnormal cardiac CT angiography  . Medication management  . Thoracic back pain  . SI (sacroiliac) pain  . Right hip pain  . Myofascial pain  . Erectile dysfunction  [3] Past Surgical History: Procedure Laterality Date  . ADENOIDECTOMY  1972  . ANKLE FRACTURE SURGERY Right    Procedure: ANKLE FRACTURE SURGERY  . ANTERIOR CERVICAL DISCECTOMY W/ FUSION N/A 06/12/2019   Procedure: C3-4 and C4-5 ANTERIOR CERVICAL DISCECTOMY & FUSION /W allograft and PLATE;  Surgeon: Norleen Maude Capri, MD;  Location:  [facility] OR;  Service: Orthopedics;  Laterality: N/A;  DePuy/Synthes, Spinal Cord Monitoring  . APPENDECTOMY     Procedure: APPENDECTOMY  . CARDIAC SURGERY  2023   Caronary artery stent placement  . CATARACT EXTRACTION Bilateral    Procedure: CATARACT EXTRACTION  . COLONOSCOPY     Procedure: COLONOSCOPY  . CORONARY ANGIOPLASTY WITH STENT PLACEMENT    . EPIDURAL BLOCK INJECTION Bilateral 09/24/2019   Procedure: LUMBAR MEDIAL BRANCH BLOCK #1/2 L3-S1;  Surgeon: Joane Fairy Cave, MD;  Location: [facility];  Service: Stefanie Ast;  Laterality: Bilateral;  . EPIDURAL BLOCK INJECTION Bilateral 10/05/2019   Procedure: LUMBAR MEDIAL BRANCH BLOCK L3-S1    #2;  Surgeon: Joane Fairy Cave, MD;  Location:  [facility];  Service: Stefanie Physiatry;  Laterality: Bilateral;  . EYE SURGERY  2020   Bilateral cataract  . FRACTURE SURGERY  2010   Open reduction for rt. Ankle fx.  SABRA HEMORRHOID SURGERY     Procedure: HEMORRHOID SURGERY  . KNEE ARTHROSCOPY Right    Procedure: KNEE ARTHROSCOPY; x 2  . LUNG LOBECTOMY     Procedure: THORACOTOMY/ LOBECTOMY; 2/2 rt middle lobe mass, benign  . OTHER SURGICAL HISTORY     Procedure: OTHER SURGICAL HISTORY (moles)  . OTHER SURGICAL HISTORY     Procedure: OTHER SURGICAL HISTORY (anal fissure repair)  . OTHER SURGICAL HISTORY     Procedure: OTHER SURGICAL HISTORY (right foot surgery); plantar fasciitis  . OTHER SURGICAL HISTORY Right    Procedure: OTHER SURGICAL HISTORY (great toe fracture surgery)  . PENILE PROSTHESIS  REMOVAL N/A 05/13/2020   Procedure: PENILE PROSTHESIS REMOVAL;  Surgeon: Marko Antionette Height, MD;  Location: [facility] OUT[facility] OR;  Service: Urology;  Laterality: N/A;  . PENILE PROSTHESIS IMPLANT N/A 03/31/2020   Procedure: PENILE PROSTHESIS INSERTION;  Surgeon: Lamar Lynwood Sar, MD;  Location: [facility] MAIN OR;  Service: Urology;  Laterality: N/A;  . PENILE PROSTHESIS IMPLANT N/A 11/07/2023   INSERTION/REVISION PENILE IMPLANT. performed by Bernardino Belvie Gathers, MD at [facility] OR  . RADIOFREQUENCY ABLATION NERVES Left 10/15/2019   Procedure: LUMBAR RADIOFREQUENCY LEFT L3-S1;  Surgeon: Joane Fairy Cave, MD;  Location: [facility];  Service: Stefanie Ast;  Laterality: Left;  . RADIOFREQUENCY ABLATION NERVES Right 10/29/2019   Procedure: LUMBAR RADIOFREQUENCY Lumbar 3-Sacral 1 #2/2;  Surgeon: Joane Fairy Cave, MD;  Location: [facility];  Service: Stefanie Ast;  Laterality: Right;  . SACROILIAC JOINT INJECTION Bilateral 08/08/2019   Procedure: SACROILIAC JOINT INJECTION;  Surgeon: Joane Fairy Cave, MD;  Location: [facility];  Service: Stefanie Ast;  Laterality: Bilateral;  . SACROILIAC JOINT INJECTION Left 07/23/2020    Procedure: SACROILIAC JOINT INJECTION;  Surgeon: Donnice Lawyer, MD;  Location: [facility];  Service: Pain Services;  Laterality: Left;  . SKIN GRAFT FULL THICKNESS TRUNK     Chest debridment with skin grafting  . SPINE SURGERY  2020   Cervical spine, 2008 has less invasive lumbar spine fusion.  . TONSILLECTOMY     Procedure: TONSILLECTOMY  . VASECTOMY  1980  [4] Family History Problem Relation Name Age of Onset  . Heart attack Mother Elenor   . Depression Mother Elenor   . Alcohol  abuse Mother Elenor        Deceased  . Cancer Mother Elenor        Lung cancer  . Drug abuse Mother Elenor        Sedatives  . Early death Mother Elenor   . Heart disease Mother  Carrie        Deceased  . Hyperlipidemia Mother Elenor   . Mental illness Mother Elenor   . Arthritis Mother Elenor 53 - 91  . Heart attack Father Wm.   SABRA Arthritis Father Wm.        Deceased  . Cancer Father Wm.        Lung cancer  . Early death Father 29.   SABRA Hearing loss Father Wm.   SABRA Heart disease Father Wm.        Deceased  . Hyperlipidemia Father Wm.   SABRA Hypertension Brother India   . Lymphoma Brother [place]   . Heart disease Brother [place]   . Arthritis Brother [place]   . Cancer Brother [place]        Leukemia  . Diabetes Brother [place]   . Hyperlipidemia Brother [place]   . Hearing loss Brother Brian Malone 50 - 59  . Depression Brother Lemond (deceased)   . Alcohol  abuse Brother Lemond (deceased)   . Early death Brother Lemond (deceased)   . Heart disease Brother Lemond (deceased)   . Hyperlipidemia Brother Lemond (deceased)   . Hypertension Brother Lemond (deceased)   . Mental illness Brother Lemond (deceased)   . Alcohol  abuse Maternal Uncle Luther        Deceased  . Cancer Maternal Higinio Minder        Prostate to bone cancer  . Cancer Paternal Aunt Laverne        Brain cancer  . Cancer Maternal Uncle Lloyd        Leukemia  . COPD Maternal Aunt Reba        Deceased  . Arthritis Maternal Grandmother Alice 40 - 49  . Heart  disease Maternal Grandmother Alice 60 - 69

## 2024-09-04 ENCOUNTER — Other Ambulatory Visit: Payer: Self-pay

## 2024-09-04 ENCOUNTER — Emergency Department (HOSPITAL_BASED_OUTPATIENT_CLINIC_OR_DEPARTMENT_OTHER)

## 2024-09-04 ENCOUNTER — Encounter (HOSPITAL_BASED_OUTPATIENT_CLINIC_OR_DEPARTMENT_OTHER): Payer: Self-pay

## 2024-09-04 ENCOUNTER — Inpatient Hospital Stay (HOSPITAL_BASED_OUTPATIENT_CLINIC_OR_DEPARTMENT_OTHER)
Admission: EM | Admit: 2024-09-04 | Discharge: 2024-09-06 | DRG: 322 | Disposition: A | Discharge status: Home / Self Care | Attending: Student in an Organized Health Care Education/Training Program | Admitting: Student in an Organized Health Care Education/Training Program

## 2024-09-04 DIAGNOSIS — M109 Gout, unspecified: Secondary | ICD-10-CM | POA: Diagnosis present

## 2024-09-04 DIAGNOSIS — Z955 Presence of coronary angioplasty implant and graft: Secondary | ICD-10-CM

## 2024-09-04 DIAGNOSIS — Z79899 Other long term (current) drug therapy: Secondary | ICD-10-CM

## 2024-09-04 DIAGNOSIS — Z981 Arthrodesis status: Secondary | ICD-10-CM

## 2024-09-04 DIAGNOSIS — Z85828 Personal history of other malignant neoplasm of skin: Secondary | ICD-10-CM

## 2024-09-04 DIAGNOSIS — Z21 Asymptomatic human immunodeficiency virus [HIV] infection status: Secondary | ICD-10-CM | POA: Diagnosis present

## 2024-09-04 DIAGNOSIS — M1990 Unspecified osteoarthritis, unspecified site: Secondary | ICD-10-CM | POA: Diagnosis present

## 2024-09-04 DIAGNOSIS — Z8616 Personal history of COVID-19: Secondary | ICD-10-CM | POA: Diagnosis not present

## 2024-09-04 DIAGNOSIS — F32A Depression, unspecified: Secondary | ICD-10-CM | POA: Diagnosis present

## 2024-09-04 DIAGNOSIS — E782 Mixed hyperlipidemia: Secondary | ICD-10-CM | POA: Diagnosis present

## 2024-09-04 DIAGNOSIS — Z881 Allergy status to other antibiotic agents status: Secondary | ICD-10-CM

## 2024-09-04 DIAGNOSIS — I214 Non-ST elevation (NSTEMI) myocardial infarction: Secondary | ICD-10-CM | POA: Diagnosis not present

## 2024-09-04 DIAGNOSIS — Z8249 Family history of ischemic heart disease and other diseases of the circulatory system: Secondary | ICD-10-CM

## 2024-09-04 DIAGNOSIS — Z888 Allergy status to other drugs, medicaments and biological substances status: Secondary | ICD-10-CM

## 2024-09-04 DIAGNOSIS — E1165 Type 2 diabetes mellitus with hyperglycemia: Secondary | ICD-10-CM | POA: Diagnosis present

## 2024-09-04 DIAGNOSIS — Z7985 Long-term (current) use of injectable non-insulin antidiabetic drugs: Secondary | ICD-10-CM

## 2024-09-04 DIAGNOSIS — R079 Chest pain, unspecified: Secondary | ICD-10-CM | POA: Diagnosis present

## 2024-09-04 DIAGNOSIS — R7989 Other specified abnormal findings of blood chemistry: Secondary | ICD-10-CM | POA: Diagnosis not present

## 2024-09-04 DIAGNOSIS — Z7984 Long term (current) use of oral hypoglycemic drugs: Secondary | ICD-10-CM

## 2024-09-04 DIAGNOSIS — I2510 Atherosclerotic heart disease of native coronary artery without angina pectoris: Secondary | ICD-10-CM | POA: Diagnosis present

## 2024-09-04 DIAGNOSIS — E119 Type 2 diabetes mellitus without complications: Secondary | ICD-10-CM | POA: Diagnosis not present

## 2024-09-04 DIAGNOSIS — F419 Anxiety disorder, unspecified: Secondary | ICD-10-CM | POA: Diagnosis present

## 2024-09-04 DIAGNOSIS — I10 Essential (primary) hypertension: Secondary | ICD-10-CM | POA: Diagnosis present

## 2024-09-04 DIAGNOSIS — K219 Gastro-esophageal reflux disease without esophagitis: Secondary | ICD-10-CM | POA: Diagnosis present

## 2024-09-04 DIAGNOSIS — Z8619 Personal history of other infectious and parasitic diseases: Secondary | ICD-10-CM

## 2024-09-04 DIAGNOSIS — Z7902 Long term (current) use of antithrombotics/antiplatelets: Secondary | ICD-10-CM

## 2024-09-04 DIAGNOSIS — Z7982 Long term (current) use of aspirin: Secondary | ICD-10-CM | POA: Diagnosis not present

## 2024-09-04 DIAGNOSIS — E1169 Type 2 diabetes mellitus with other specified complication: Secondary | ICD-10-CM | POA: Diagnosis present

## 2024-09-04 DIAGNOSIS — R0602 Shortness of breath: Secondary | ICD-10-CM | POA: Diagnosis present

## 2024-09-04 DIAGNOSIS — Z8673 Personal history of transient ischemic attack (TIA), and cerebral infarction without residual deficits: Secondary | ICD-10-CM | POA: Diagnosis not present

## 2024-09-04 DIAGNOSIS — Z885 Allergy status to narcotic agent status: Secondary | ICD-10-CM | POA: Diagnosis not present

## 2024-09-04 LAB — BASIC METABOLIC PANEL WITH GFR
Anion gap: 12 (ref 5–15)
BUN: 27 mg/dL — ABNORMAL HIGH (ref 8–23)
CO2: 24 mmol/L (ref 22–32)
Calcium: 9.5 mg/dL (ref 8.9–10.3)
Chloride: 100 mmol/L (ref 98–111)
Creatinine, Ser: 1.37 mg/dL — ABNORMAL HIGH (ref 0.61–1.24)
GFR, Estimated: 55 mL/min — ABNORMAL LOW (ref >60)
Glucose, Bld: 151 mg/dL — ABNORMAL HIGH (ref 70–99)
Potassium: 4.8 mmol/L (ref 3.5–5.1)
Sodium: 135 mmol/L (ref 135–145)

## 2024-09-04 LAB — CBC
HCT: 40.3 % (ref 39.0–52.0)
Hemoglobin: 13.5 g/dL (ref 13.0–17.0)
MCH: 31.5 pg (ref 26.0–34.0)
MCHC: 33.5 g/dL (ref 30.0–36.0)
MCV: 94.2 fL (ref 80.0–100.0)
Platelets: 161 K/uL (ref 150–400)
RBC: 4.28 MIL/uL (ref 4.22–5.81)
RDW: 13.4 % (ref 11.5–15.5)
WBC: 6.2 K/uL (ref 4.0–10.5)
nRBC: 0 % (ref 0.0–0.2)

## 2024-09-04 LAB — TROPONIN T, HIGH SENSITIVITY
Troponin T High Sensitivity: 49 ng/L — ABNORMAL HIGH (ref 0–19)
Troponin T High Sensitivity: 60 ng/L — ABNORMAL HIGH (ref 0–19)
Troponin T High Sensitivity: 117 ng/L (ref 0–19)

## 2024-09-04 LAB — PROTIME-INR
INR: 0.9 (ref 0.8–1.2)
Prothrombin Time: 13 s (ref 11.4–15.2)

## 2024-09-04 LAB — MAGNESIUM: Magnesium: 2.3 mg/dL (ref 1.7–2.4)

## 2024-09-04 LAB — HEPARIN LEVEL (UNFRACTIONATED): Heparin Unfractionated: 0.27 [IU]/mL — ABNORMAL LOW (ref 0.30–0.70)

## 2024-09-04 MED ORDER — HEPARIN BOLUS VIA INFUSION
1000.0000 [IU] | Freq: Once | INTRAVENOUS | Status: AC
Start: 2024-09-04 — End: 2024-09-04
  Administered 2024-09-04: 1000 [IU] via INTRAVENOUS
  Filled 2024-09-04: qty 1000

## 2024-09-04 MED ORDER — FLUTICASONE PROPIONATE 50 MCG/ACT NA SUSP: 1.0000 | NASAL | Status: DC

## 2024-09-04 MED ORDER — ABACAVIR-DOLUTEGRAVIR-LAMIVUD 600-50-300 MG PO TABS
1.0000 | ORAL_TABLET | Freq: Every day | ORAL | Status: DC
  Administered 2024-09-04 – 2024-09-05 (×2): 1 via ORAL
  Filled 2024-09-04 (×2): qty 1

## 2024-09-04 MED ORDER — ASPIRIN 81 MG PO CHEW
324.0000 mg | CHEWABLE_TABLET | Freq: Once | ORAL | Status: AC
Start: 2024-09-04 — End: 2024-09-04
  Administered 2024-09-04: 324 mg via ORAL
  Filled 2024-09-04: qty 4

## 2024-09-04 MED ORDER — PROPRANOLOL HCL ER 60 MG PO CP24
60.0000 mg | ORAL_CAPSULE | Freq: Every evening | ORAL | Status: DC
  Administered 2024-09-05: 60 mg via ORAL
  Filled 2024-09-04: qty 1

## 2024-09-04 MED ORDER — POLYETHYLENE GLYCOL 3350 17 G PO PACK: 17.0000 g | PACK | Freq: Every day | ORAL | Status: DC | PRN

## 2024-09-04 MED ORDER — CLOPIDOGREL BISULFATE 75 MG PO TABS
75.0000 mg | ORAL_TABLET | Freq: Every day | ORAL | Status: DC
Start: 2024-09-05 — End: 2024-09-06
  Administered 2024-09-05: 75 mg via ORAL
  Filled 2024-09-04 (×2): qty 1

## 2024-09-04 MED ORDER — LORATADINE 10 MG PO TABS
10.0000 mg | ORAL_TABLET | Freq: Every evening | ORAL | Status: DC
  Administered 2024-09-05: 10 mg via ORAL
  Filled 2024-09-04: qty 1

## 2024-09-04 MED ORDER — ROSUVASTATIN CALCIUM 20 MG PO TABS
20.0000 mg | ORAL_TABLET | Freq: Every day | ORAL | Status: DC
  Administered 2024-09-04 – 2024-09-05 (×2): 20 mg via ORAL
  Filled 2024-09-04 (×2): qty 1

## 2024-09-04 MED ORDER — ONDANSETRON HCL 4 MG/2ML IJ SOLN: 4.0000 mg | Freq: Four times a day (QID) | INTRAMUSCULAR | Status: DC | PRN

## 2024-09-04 MED ORDER — NITROGLYCERIN 0.4 MG SL SUBL
0.4000 mg | SUBLINGUAL_TABLET | SUBLINGUAL | Status: DC | PRN
Start: 2024-09-04 — End: 2024-09-04

## 2024-09-04 MED ORDER — ASPIRIN 81 MG PO TBEC
81.0000 mg | DELAYED_RELEASE_TABLET | Freq: Every day | ORAL | Status: DC
  Administered 2024-09-05: 81 mg via ORAL
  Filled 2024-09-04: qty 1

## 2024-09-04 MED ORDER — PANTOPRAZOLE SODIUM 40 MG PO TBEC
40.0000 mg | DELAYED_RELEASE_TABLET | Freq: Every day | ORAL | Status: DC
  Administered 2024-09-05 – 2024-09-06 (×2): 40 mg via ORAL
  Filled 2024-09-04 (×2): qty 1

## 2024-09-04 MED ORDER — HEPARIN BOLUS VIA INFUSION
4000.0000 [IU] | Freq: Once | INTRAVENOUS | Status: AC
  Administered 2024-09-04: 4000 [IU] via INTRAVENOUS

## 2024-09-04 MED ORDER — EZETIMIBE 10 MG PO TABS
10.0000 mg | ORAL_TABLET | Freq: Every morning | ORAL | Status: DC
  Administered 2024-09-05 – 2024-09-06 (×2): 10 mg via ORAL
  Filled 2024-09-04 (×2): qty 1

## 2024-09-04 MED ORDER — TAMSULOSIN HCL 0.4 MG PO CAPS
0.4000 mg | ORAL_CAPSULE | Freq: Every day | ORAL | Status: DC
  Administered 2024-09-04 – 2024-09-05 (×2): 0.4 mg via ORAL
  Filled 2024-09-04 (×2): qty 1

## 2024-09-04 MED ORDER — CLONAZEPAM 0.5 MG PO TABS
0.5000 mg | ORAL_TABLET | Freq: Every day | ORAL | Status: DC
  Administered 2024-09-04 – 2024-09-05 (×2): 0.5 mg via ORAL
  Filled 2024-09-04 (×2): qty 1

## 2024-09-04 MED ORDER — BUSPIRONE HCL 5 MG PO TABS
30.0000 mg | ORAL_TABLET | Freq: Two times a day (BID) | ORAL | Status: DC
Start: 2024-09-04 — End: 2024-09-06
  Administered 2024-09-04 – 2024-09-06 (×3): 30 mg via ORAL
  Filled 2024-09-04 (×4): qty 6

## 2024-09-04 MED ORDER — ABACAVIR-DOLUTEGRAVIR-LAMIVUD 600-50-300 MG PO TABS: 1.0000 | ORAL_TABLET | Freq: Every day | ORAL | Status: DC

## 2024-09-04 MED ORDER — HEPARIN (PORCINE) 25000 UT/250ML-% IV SOLN
1200.0000 [IU]/h | INTRAVENOUS | Status: DC
  Administered 2024-09-04: 1000 [IU]/h via INTRAVENOUS
  Administered 2024-09-05: 1200 [IU]/h via INTRAVENOUS
  Filled 2024-09-04 (×2): qty 250

## 2024-09-04 MED ORDER — ACETAMINOPHEN 325 MG PO TABS: 650.0000 mg | ORAL_TABLET | ORAL | Status: DC | PRN

## 2024-09-04 MED ORDER — NITROGLYCERIN 0.4 MG SL SUBL: 0.4000 mg | SUBLINGUAL_TABLET | SUBLINGUAL | Status: DC | PRN

## 2024-09-04 NOTE — Progress Notes (Addendum)
 PHARMACY - ANTICOAGULATION CONSULT NOTE  Pharmacy Consult for heparin  Indication: chest pain/ACS  Allergies  Allergen Reactions   Adhesive [Tape] Other (See Comments)    Tape will take off my skin, as will Band-Aids   Cyclobenzaprine  Other (See Comments)    Hallucinations   Duloxetine  Hcl Other (See Comments)    Makes PTSD- induced nightmares more vivid    Morphine Itching and Other (See Comments)    In IV form causes hallucinations, aggression, and makes patient altered also only.  Oral morphine is ok cannot take injection     Zyvox  [Linezolid ] Nausea And Vomiting    And headaches   Pregabalin Anxiety    Pt reports insomnia and worsening anxiety     Patient Measurements: Height: 6' (182.9 cm) Weight: 83.9 kg (185 lb) IBW/kg (Calculated) : 77.6 HEPARIN  DW (KG): 83.9  Vital Signs: Temp: 97.7 F (36.5 C) (09/16 1936) Temp Source: Oral (09/16 1936) BP: 121/74 (09/16 2100) Pulse Rate: 92 (09/16 2100)  Labs: Recent Labs    09/04/24 1032 09/04/24 2023  HGB 13.5  --   HCT 40.3  --   PLT 161  --   LABPROT 13.0  --   INR 0.9  --   HEPARINUNFRC  --  0.27*  CREATININE 1.37*  --     Estimated Creatinine Clearance: 55.1 mL/min (A) (by C-G formula based on SCr of 1.37 mg/dL (H)).   Medical History: Past Medical History:  Diagnosis Date   Acute subdural hematoma (HCC) 10/28/2019   Anxiety    Arthritis    Basal cell carcinoma    Cancer (HCC)    basal cell on nose   Cellulitis of right upper extremity 11/04/2020   Complication of anesthesia    Depression    Diabetes mellitus without complication (HCC)    Dysphagia    GERD (gastroesophageal reflux disease)    Hepatitis C    High cholesterol    History of COVID-19 05/26/2020   History of kidney stones    HIV (human immunodeficiency virus infection) (HCC)    Hypertension    Pneumonia    PONV (postoperative nausea and vomiting)    Renal disorder    Sleep apnea    does not use CPAP   Tachycardia      Assessment: Brian Malone presenting with chest pain. No oral anticoagulation reported prior to admission. CBC stable. Pharmacy consulted to manage heparin  infusion.  Heparin  level subtherapeutic at 0.27. Hgb and PLT WNL.   Goal of Therapy:  Heparin  level 0.3-0.7 units/ml Monitor platelets by anticoagulation protocol: Yes   Plan:  Give 1000 unit bolus Increase heparin  infusion at 1200 units/hr  Check heparin  level in 6 hours and daily while on heparin   Continue to monitor H&H and platelets   Thank you for allowing pharmacy to be a part of this patient's care   Prentice DOROTHA Favors, PharmD PGY1 Health-System Pharmacy Administration and Leadership Resident [facility]  09/04/2024 9:50 PM

## 2024-09-04 NOTE — Progress Notes (Signed)
 Pt admitted to 6E01 via [facility] from [facility]. Pt alert and oriented x4. Room Air. Pt has Heparin  gtt infusing at 10 cc/hr. Pt ambulatory. Skin checked with Willie, charge RN and intact. Noted to have left ankle monitor which pt says he's had for 6 years now and [place] house arrest. Dr. Duffy of Cardiology paged of pts arrival to the unit.

## 2024-09-04 NOTE — ED Notes (Signed)
Patient updated on plan of care

## 2024-09-04 NOTE — Progress Notes (Signed)
 PHARMACY - ANTICOAGULATION CONSULT NOTE  Pharmacy Consult for heparin  Indication: chest pain/ACS  Allergies  Allergen Reactions   Adhesive [Tape] Other (See Comments)    Tape will take off my skin, as will Band-Aids   Cyclobenzaprine  Other (See Comments)    Hallucinations   Duloxetine  Hcl Other (See Comments)    Makes PTSD- induced nightmares more vivid    Morphine Itching and Other (See Comments)    In IV form causes hallucinations, aggression, and makes patient altered also only.  Oral morphine is ok cannot take injection     Zyvox  [Linezolid ] Nausea And Vomiting    And headaches   Pregabalin Anxiety    Pt reports insomnia and worsening anxiety     Patient Measurements: Height: 6' (182.9 cm) Weight: 83.9 kg (185 lb) IBW/kg (Calculated) : 77.6 HEPARIN  DW (KG): 83.9  Vital Signs: Temp: 98.1 F (36.7 C) (09/16 1021) Temp Source: Oral (09/16 1021) BP: 138/86 (09/16 1021) Pulse Rate: 92 (09/16 1021)  Labs: Recent Labs    09/04/24 1032  HGB 13.5  HCT 40.3  PLT 161  LABPROT 13.0  INR 0.9  CREATININE 1.37*    Estimated Creatinine Clearance: 55.1 mL/min (A) (by C-G formula based on SCr of 1.37 mg/dL (H)).   Medical History: Past Medical History:  Diagnosis Date   Acute subdural hematoma (HCC) 10/28/2019   Anxiety    Arthritis    Basal cell carcinoma    Cancer (HCC)    basal cell on nose   Cellulitis of right upper extremity 11/04/2020   Complication of anesthesia    Depression    Diabetes mellitus without complication (HCC)    Dysphagia    GERD (gastroesophageal reflux disease)    Hepatitis C    High cholesterol    History of COVID-19 05/26/2020   History of kidney stones    HIV (human immunodeficiency virus infection) (HCC)    Hypertension    Pneumonia    PONV (postoperative nausea and vomiting)    Renal disorder    Sleep apnea    does not use CPAP   Tachycardia     Assessment: 72yoM presenting with chest pain. No oral anticoagulation  reported prior to admission. CBC stable. Pharmacy consulted to manage heparin  infusion.   Goal of Therapy:  Heparin  level 0.3-0.7 units/ml Monitor platelets by anticoagulation protocol: Yes   Plan:  Give 4000 units bolus x 1 Start heparin  infusion at 1000 units/hr Check anti-Xa level in 8 hours and daily while on heparin  Continue to monitor H&H and platelets  Brian Malone L Fiza Nation 09/04/2024,11:51 AM

## 2024-09-04 NOTE — ED Triage Notes (Addendum)
 Pt reports mid/central chest pain while walking this morning. Described as ache. Became nauseated. and sweaty. Pain has decreased at this time . Has cardiac hx. Pt did not take nitroglycerin   Same type of episode 3 weeks ago and took 3 nitroglycerin . Pain resolved. ' Several night of chest tightness

## 2024-09-04 NOTE — ED Provider Notes (Signed)
 [facility] AT [facility] Provider Note   CSN: [id number] Arrival date & time: 09/04/24  1017     Patient presents with: Chest Pain   Brian Malone is a 73 y.o. male.   73 yo M with a chief complaints of chest pain.  Patient normally walks every morning and was doing his normal exercise routine and suddenly developed some chest pain.  Describes this as severe tightness he was a bit sweaty and short of breath at the time but he thinks that could have been due to his exercising.  He stopped walking and took a break but did not have significant improvement initially.  He now feels a bit better.  He had a similar episode a couple nights ago that occurred at rest and then resolved after 3 nitroglycerin .  Has a history of a stent placed in the past.  He symptoms feel similar to then but feel actually bit worse to him.  He denies cough congestion or fever denies nausea vomiting or diarrhea.   Chest Pain      Prior to Admission medications   Medication Sig Start Date End Date Taking? Authorizing Provider  clopidogrel  (PLAVIX ) 75 MG tablet TAKE 1 TABLET BY MOUTH DAILY 11/22/23  Yes Acharya, Gayatri A, MD  acetaminophen  (TYLENOL ) 500 MG tablet Take 1,000 mg by mouth every 6 (six) hours as needed for mild pain (pain score 1-3).    [provider]  buprenorphine (BUTRANS) 20 MCG/HR PTWK Place 1 patch onto the skin daily as needed (Pain). 10/09/21   [provider]  busPIRone  (BUSPAR ) 30 MG tablet Take 30 mg by mouth at bedtime. 03/04/21   [provider]  Carboxymethylcellul-Glycerin (LUBRICATING EYE DROPS OP) Place 1 drop into both eyes daily as needed (dry eyes).    [provider]  clonazePAM  (KLONOPIN ) 0.5 MG tablet Take 0.5 mg by mouth at bedtime. Adjust with anxiety level 10/01/19   [provider]  docusate sodium  (COLACE) 250 MG capsule Take 250 mg by mouth at bedtime.    [provider]  Ensure Max  Protein (ENSURE MAX PROTEIN) LIQD Take 11 oz by mouth daily.    [provider]  ezetimibe  (ZETIA ) 10 MG tablet Take 10 mg by mouth every morning.    [provider]  fluticasone  (FLONASE ) 50 MCG/ACT nasal spray Place 1 spray into both nostrils See admin instructions. Use topically for skin irritation from patch    [provider]  loratadine  (CLARITIN ) 10 MG tablet Take 10 mg by mouth every evening.    [provider]  nitroGLYCERIN  (NITROSTAT ) 0.4 MG SL tablet Place 1 tablet (0.4 mg total) under the tongue every 5 (five) minutes as needed. 10/26/22   Henry Manuelita NOVAK, NP  ondansetron  (ZOFRAN -ODT) 4 MG disintegrating tablet Take 1 tablet (4 mg total) by mouth every 8 (eight) hours as needed for nausea or vomiting. 12/16/23   Landy Honora CROME, PA-C  ondansetron  (ZOFRAN -ODT) 8 MG disintegrating tablet Take 8 mg by mouth every 8 (eight) hours as needed for nausea or vomiting.    [provider]  OZEMPIC, 0.25 OR 0.5 MG/DOSE, 2 MG/3ML SOPN Inject 0.5 mg into the skin once a week. 12/16/23   [provider]  pantoprazole  (PROTONIX ) 40 MG tablet Take 40 mg by mouth daily before breakfast.  02/12/19   [provider]  polyethylene glycol (MIRALAX  / GLYCOLAX ) 17 g packet Take 17 g by mouth daily as needed for mild constipation  or moderate constipation. Capful    [provider]  propranolol  ER (INDERAL  LA) 60 MG 24 hr capsule Take 60 mg by mouth every evening. 04/28/21   [provider]  rosuvastatin  (CRESTOR ) 20 MG tablet Take 1 tablet (20 mg total) by mouth at bedtime. 10/26/22   Anner Alm ORN, MD  tamsulosin  (FLOMAX ) 0.4 MG CAPS capsule Take 0.4 mg by mouth at bedtime.  02/12/19   [provider]  TRIUMEQ  600-50-300 MG tablet Take 1 tablet by mouth daily.    [provider]    Allergies: Adhesive [tape], Cyclobenzaprine , Duloxetine  hcl, Morphine, Zyvox  [linezolid ], and Pregabalin    Review of Systems   Cardiovascular:  Positive for chest pain.    Updated Vital Signs BP 128/78   Pulse 82   Temp 98.1 F (36.7 C) (Oral)   Resp 15   Ht 6' (1.829 m)   Wt 83.9 kg   SpO2 99%   BMI 25.09 kg/m   Physical Exam Vitals and nursing note reviewed.  Constitutional:      Appearance: He is well-developed.  HENT:     Head: Normocephalic and atraumatic.  Eyes:     Pupils: Pupils are equal, round, and reactive to light.  Neck:     Vascular: No JVD.  Cardiovascular:     Rate and Rhythm: Normal rate and regular rhythm.     Heart sounds: No murmur heard.    No friction rub. No gallop.  Pulmonary:     Effort: No respiratory distress.     Breath sounds: No wheezing.  Abdominal:     General: There is no distension.     Tenderness: There is no abdominal tenderness. There is no guarding or rebound.  Musculoskeletal:        General: Normal range of motion.     Cervical back: Normal range of motion and neck supple.  Skin:    Coloration: Skin is not pale.     Findings: No rash.  Neurological:     Mental Status: He is alert and oriented to person, place, and time.  Psychiatric:        Behavior: Behavior normal.     (all labs ordered are listed, but only abnormal results are displayed) Labs Reviewed  BASIC METABOLIC PANEL WITH GFR - Abnormal; Notable for the following components:      Result Value   Glucose, Bld 151 (*)    BUN 27 (*)    Creatinine, Ser 1.37 (*)    GFR, Estimated 55 (*)    All other components within normal limits  TROPONIN T, HIGH SENSITIVITY - Abnormal; Notable for the following components:   Troponin T High Sensitivity 49 (*)    All other components within normal limits  TROPONIN T, HIGH SENSITIVITY - Abnormal; Notable for the following components:   Troponin T High Sensitivity 60 (*)    All other components within normal limits  CBC  PROTIME-INR  HEPARIN  LEVEL (UNFRACTIONATED)    EKG: EKG Interpretation Date/Time:  Tuesday September 04 2024 10:23:25  EDT Ventricular Rate:  93 PR Interval:  128 QRS Duration:  93 QT Interval:  355 QTC Calculation: 442 R Axis:   18  Text Interpretation: Sinus rhythm No significant change since last tracing Confirmed by Emil Share [phone]) on 09/04/2024 10:47:37 AM  Radiology: [facility] Chest 2 View Result Date: 09/04/2024 CLINICAL DATA:  Chest pain EXAM: CHEST - 2 VIEW COMPARISON:  05/16/2024 FINDINGS: Subtle ill-defined nodular opacity is seen at the right lung  base. There is ill-defined patchy opacity in the peripheral right mid lung. Left lung clear. No pleural effusion. The cardiopericardial silhouette is within normal limits for size. No acute bony abnormality. IMPRESSION: Subtle ill-defined nodular opacity at the right lung base with ill-defined patchy opacity in the peripheral right mid lung. Imaging features could be related to pneumonia although close follow-up recommended to ensure resolution. Electronically Signed   By: Camellia Candle M.D.   On: 09/04/2024 11:24     .Critical Care  Performed by: Emil Share, DO Authorized by: Emil Share, DO   Critical care provider statement:    Critical care time (minutes):  35   Critical care time was exclusive of:  Separately billable procedures and treating other patients   Critical care was time spent personally by me on the following activities:  Development of treatment plan with patient or surrogate, discussions with consultants, evaluation of patient's response to treatment, examination of patient, ordering and review of laboratory studies, ordering and review of radiographic studies, ordering and performing treatments and interventions, pulse oximetry, re-evaluation of patient's condition and review of old charts   Care discussed with: admitting provider      Medications Ordered in the ED  nitroGLYCERIN  (NITROSTAT ) SL tablet 0.4 mg (has no administration in time range)  heparin  ADULT infusion 100 units/mL (25000 units/250mL) (1,000 Units/hr Intravenous New  Bag/Given 09/04/24 1204)  aspirin  chewable tablet 324 mg (324 mg Oral Given 09/04/24 1139)  heparin  bolus via infusion 4,000 Units (4,000 Units Intravenous Bolus from Bag 09/04/24 1205)                                    Medical Decision Making Amount and/or Complexity of Data Reviewed Labs: ordered. Radiology: ordered. ECG/medicine tests: ordered.  Risk OTC drugs. Prescription drug management. Decision regarding hospitalization.   73 yo M with a chief complaint of exertional chest pain.  Happened while the patient was exercising today.  Has almost resolved now.  EKG with some baseline wander but without obvious ST changes.  Initial troponin is positive at 50.  Brian give aspirin  heparin  Brian discuss with cardiology  I discussed case with Dr. Santo, felt the patient would benefit from second troponin.  Second troponin is trended upward though not significantly so.  Patient's pain has improved with nitroglycerin  here.  Discussed with cardiology for admission.  The patients results and plan were reviewed and discussed.   Any x-rays performed were independently reviewed by myself.   Differential diagnosis were considered with the presenting HPI.  Medications  nitroGLYCERIN  (NITROSTAT ) SL tablet 0.4 mg (has no administration in time range)  heparin  ADULT infusion 100 units/mL (25000 units/250mL) (1,000 Units/hr Intravenous New Bag/Given 09/04/24 1204)  aspirin  chewable tablet 324 mg (324 mg Oral Given 09/04/24 1139)  heparin  bolus via infusion 4,000 Units (4,000 Units Intravenous Bolus from Bag 09/04/24 1205)    Vitals:   09/04/24 1021 09/04/24 1029 09/04/24 1143 09/04/24 1300  BP: 138/86   128/78  Pulse: 92   82  Resp: 15   15  Temp: 98.1 F (36.7 C)     TempSrc: Oral     SpO2: 99%   99%  Weight:  83.9 kg 83.9 kg   Height:   6' (1.829 m)     Final diagnoses:  NSTEMI (non-ST elevated myocardial infarction) [facility])    Admission/ observation were discussed with the  admitting physician, patient and/or family  and they are comfortable with the plan.       Final diagnoses:  NSTEMI (non-ST elevated myocardial infarction) [facility])    ED Discharge Orders     None          Emil Share, DO 09/04/24 1430

## 2024-09-04 NOTE — ED Notes (Signed)
 Patient transported to [facility] via [facility] at this time

## 2024-09-05 ENCOUNTER — Other Ambulatory Visit: Payer: Self-pay

## 2024-09-05 ENCOUNTER — Encounter (HOSPITAL_BASED_OUTPATIENT_CLINIC_OR_DEPARTMENT_OTHER): Payer: Self-pay

## 2024-09-05 ENCOUNTER — Inpatient Hospital Stay (HOSPITAL_COMMUNITY)

## 2024-09-05 ENCOUNTER — Encounter (HOSPITAL_COMMUNITY)
Admission: EM | Disposition: A | Discharge status: Home / Self Care | Payer: Self-pay | Source: Home / Self Care | Attending: Student in an Organized Health Care Education/Training Program

## 2024-09-05 ENCOUNTER — Telehealth (HOSPITAL_COMMUNITY): Payer: Self-pay | Admitting: Pharmacy Technician

## 2024-09-05 ENCOUNTER — Other Ambulatory Visit (HOSPITAL_COMMUNITY): Payer: Self-pay

## 2024-09-05 DIAGNOSIS — Z21 Asymptomatic human immunodeficiency virus [HIV] infection status: Secondary | ICD-10-CM

## 2024-09-05 DIAGNOSIS — I214 Non-ST elevation (NSTEMI) myocardial infarction: Secondary | ICD-10-CM | POA: Diagnosis not present

## 2024-09-05 DIAGNOSIS — I2510 Atherosclerotic heart disease of native coronary artery without angina pectoris: Secondary | ICD-10-CM

## 2024-09-05 DIAGNOSIS — E782 Mixed hyperlipidemia: Secondary | ICD-10-CM | POA: Diagnosis not present

## 2024-09-05 HISTORY — PX: LEFT HEART CATH AND CORONARY ANGIOGRAPHY: CATH118249

## 2024-09-05 HISTORY — PX: CORONARY STENT INTERVENTION: CATH118234

## 2024-09-05 LAB — HEPARIN LEVEL (UNFRACTIONATED): Heparin Unfractionated: 0.43 [IU]/mL (ref 0.30–0.70)

## 2024-09-05 LAB — BASIC METABOLIC PANEL WITH GFR
Anion gap: 9 (ref 5–15)
BUN: 26 mg/dL — ABNORMAL HIGH (ref 8–23)
CO2: 25 mmol/L (ref 22–32)
Calcium: 8.9 mg/dL (ref 8.9–10.3)
Chloride: 104 mmol/L (ref 98–111)
Creatinine, Ser: 1.34 mg/dL — ABNORMAL HIGH (ref 0.61–1.24)
GFR, Estimated: 57 mL/min — ABNORMAL LOW (ref >60)
Glucose, Bld: 119 mg/dL — ABNORMAL HIGH (ref 70–99)
Potassium: 4.1 mmol/L (ref 3.5–5.1)
Sodium: 138 mmol/L (ref 135–145)

## 2024-09-05 LAB — LIPID PANEL
Cholesterol: 123 mg/dL (ref 0–200)
HDL: 47 mg/dL (ref >40)
LDL Cholesterol: 36 mg/dL (ref 0–99)
Total CHOL/HDL Ratio: 2.6 ratio
Triglycerides: 202 mg/dL — ABNORMAL HIGH (ref <150)
VLDL: 40 mg/dL (ref 0–40)

## 2024-09-05 LAB — CBC
HCT: 41.2 % (ref 39.0–52.0)
Hemoglobin: 13.6 g/dL (ref 13.0–17.0)
MCH: 31.6 pg (ref 26.0–34.0)
MCHC: 33 g/dL (ref 30.0–36.0)
MCV: 95.8 fL (ref 80.0–100.0)
Platelets: 154 K/uL (ref 150–400)
RBC: 4.3 MIL/uL (ref 4.22–5.81)
RDW: 13.6 % (ref 11.5–15.5)
WBC: 5.4 K/uL (ref 4.0–10.5)
nRBC: 0 % (ref 0.0–0.2)

## 2024-09-05 LAB — POCT ACTIVATED CLOTTING TIME
Activated Clotting Time: 538 s
Activated Clotting Time: 556 s

## 2024-09-05 LAB — ECHOCARDIOGRAM COMPLETE
Area-P 1/2: 4.63 cm2
Height: 72 in
S' Lateral: 2.4 cm
Weight: 2921.6 [oz_av]

## 2024-09-05 LAB — PROTIME-INR
INR: 1 (ref 0.8–1.2)
Prothrombin Time: 13.9 s (ref 11.4–15.2)

## 2024-09-05 SURGERY — LEFT HEART CATH AND CORONARY ANGIOGRAPHY
Anesthesia: LOCAL

## 2024-09-05 MED ORDER — ASPIRIN 81 MG PO TBEC: 81.0000 mg | DELAYED_RELEASE_TABLET | Freq: Every day | ORAL | Status: DC

## 2024-09-05 MED ORDER — LIDOCAINE HCL (PF) 1 % IJ SOLN
INTRAMUSCULAR | Status: DC | PRN
  Administered 2024-09-05: 2 mL

## 2024-09-05 MED ORDER — NITROGLYCERIN 1 MG/10 ML FOR IR/CATH LAB
INTRA_ARTERIAL | Status: DC | PRN
  Administered 2024-09-05: 200 ug via INTRACORONARY

## 2024-09-05 MED ORDER — VERAPAMIL HCL 2.5 MG/ML IV SOLN
INTRAVENOUS | Status: AC
  Filled 2024-09-05: qty 2

## 2024-09-05 MED ORDER — FENTANYL CITRATE (PF) 100 MCG/2ML IJ SOLN
INTRAMUSCULAR | Status: DC | PRN
  Administered 2024-09-05 (×2): 25 ug via INTRAVENOUS

## 2024-09-05 MED ORDER — ASPIRIN 81 MG PO CHEW: 81.0000 mg | CHEWABLE_TABLET | ORAL | Status: DC

## 2024-09-05 MED ORDER — SODIUM CHLORIDE 0.9% FLUSH: 3.0000 mL | INTRAVENOUS | Status: DC | PRN

## 2024-09-05 MED ORDER — MIDAZOLAM HCL 2 MG/2ML IJ SOLN
INTRAMUSCULAR | Status: AC
  Filled 2024-09-05: qty 2

## 2024-09-05 MED ORDER — SODIUM CHLORIDE 0.9 % IV SOLN
INTRAVENOUS | Status: AC | PRN
  Administered 2024-09-05: 250 mL via INTRAVENOUS

## 2024-09-05 MED ORDER — SODIUM CHLORIDE 0.9 % IV SOLN: 250.0000 mL | INTRAVENOUS | Status: DC | PRN

## 2024-09-05 MED ORDER — FREE WATER: 500.0000 mL | Freq: Once | Status: DC

## 2024-09-05 MED ORDER — LIDOCAINE HCL (PF) 1 % IJ SOLN
INTRAMUSCULAR | Status: AC
  Filled 2024-09-05: qty 30

## 2024-09-05 MED ORDER — SODIUM CHLORIDE 0.9% FLUSH
3.0000 mL | Freq: Two times a day (BID) | INTRAVENOUS | Status: DC
  Administered 2024-09-05 – 2024-09-06 (×2): 3 mL via INTRAVENOUS

## 2024-09-05 MED ORDER — HEPARIN SODIUM (PORCINE) 1000 UNIT/ML IJ SOLN
INTRAMUSCULAR | Status: DC | PRN
  Administered 2024-09-05 (×2): 4000 [IU] via INTRAVENOUS

## 2024-09-05 MED ORDER — FENTANYL CITRATE (PF) 100 MCG/2ML IJ SOLN
INTRAMUSCULAR | Status: AC
  Filled 2024-09-05: qty 2

## 2024-09-05 MED ORDER — CLOPIDOGREL BISULFATE 300 MG PO TABS
ORAL_TABLET | ORAL | Status: DC | PRN
  Administered 2024-09-05: 300 mg via ORAL

## 2024-09-05 MED ORDER — NITROGLYCERIN 2 % TD OINT
0.5000 [in_us] | TOPICAL_OINTMENT | Freq: Four times a day (QID) | TRANSDERMAL | Status: AC
  Administered 2024-09-05 (×2): 0.5 [in_us] via TOPICAL
  Filled 2024-09-05 (×2): qty 1

## 2024-09-05 MED ORDER — VERAPAMIL HCL 2.5 MG/ML IV SOLN
INTRAVENOUS | Status: DC | PRN
  Administered 2024-09-05: 10 mL via INTRA_ARTERIAL

## 2024-09-05 MED ORDER — IOHEXOL 350 MG/ML SOLN
INTRAVENOUS | Status: DC | PRN
  Administered 2024-09-05: 125 mL

## 2024-09-05 MED ORDER — HEPARIN SODIUM (PORCINE) 1000 UNIT/ML IJ SOLN
INTRAMUSCULAR | Status: AC
  Filled 2024-09-05: qty 10

## 2024-09-05 MED ORDER — CLOPIDOGREL BISULFATE 300 MG PO TABS
ORAL_TABLET | ORAL | Status: AC
  Filled 2024-09-05: qty 1

## 2024-09-05 MED ORDER — MIDAZOLAM HCL 2 MG/2ML IJ SOLN
INTRAMUSCULAR | Status: DC | PRN
  Administered 2024-09-05: 1 mg via INTRAVENOUS

## 2024-09-05 MED ORDER — SODIUM CHLORIDE 0.9 % IV SOLN: INTRAVENOUS | Status: DC

## 2024-09-05 MED ORDER — NITROGLYCERIN 1 MG/10 ML FOR IR/CATH LAB
INTRA_ARTERIAL | Status: AC
  Filled 2024-09-05: qty 10

## 2024-09-05 SURGICAL SUPPLY — 12 items
BALLOON EMERGE MR 2.0X12 (BALLOONS) IMPLANT
BALLOON ~~LOC~~ EMERGE MR 2.75X12 (BALLOONS) IMPLANT
CATH INFINITI AMBI 5FR JK (CATHETERS) IMPLANT
CATH LAUNCHER 6FR EBU3.5 (CATHETERS) IMPLANT
DEVICE RAD COMP TR BAND LRG (VASCULAR PRODUCTS) IMPLANT
GLIDESHEATH SLEND SS 6F .021 (SHEATH) IMPLANT
GUIDEWIRE INQWIRE 1.5J.035X260 (WIRE) IMPLANT
KIT ENCORE 26 ADVANTAGE (KITS) IMPLANT
PACK CARDIAC CATHETERIZATION (CUSTOM PROCEDURE TRAY) ×1 IMPLANT
SET ATX-X65L (MISCELLANEOUS) IMPLANT
STENT SYNERGY XD 2.50X20 (Permanent Stent) IMPLANT
WIRE RUNTHROUGH .014X180CM (WIRE) IMPLANT

## 2024-09-05 NOTE — Plan of Care (Signed)

## 2024-09-05 NOTE — H&P (Addendum)
 Cardiology Admission History and Physical:    Patient ID: Brian Malone MRN: [id number]; DOB: 20-Aug-1951   Admission date: 09/04/2024  Primary Care Provider: Nikki Rams, Aliene, MD Primary Cardiologist: Soyla DELENA Merck, MD  Primary Electrophysiologist:  None   Chief Complaint:  Admit for NSTEMI   Patient Profile:   Brian Malone is a 73 y.o. male with HIV, Hepatitis C s/p treatment, CVA, CAD ( last diagnositc cath November 2023 with known 3v CAD with PCI to LAD), DM   History of Present Illness:   Brian Malone presented to [facility] with chest pain that was associated with diaphoresis and dyspnea with exertion.  He tells me he went for a walk yesterday.  He then developed exertional chest pressure and shortness of breath.  He stopped and symptoms persisted.  He tells me he did not feel well.  He took a shower yesterday and had recurrence of symptoms while doing minimal activity.  He then presented to [facility] for further evaluation.  He reports chest pain on and off overnight.  Denies any shortness of breath.  No fevers or chills.  Has been taking his medications.  Left heart catheterization 10/26/2022 showed severe mid to distal LAD lesion that underwent percutaneous coronary intervention.  He was noted to have an 80% diagonal lesion and 50% mid RCA lesion that was managed medically.  He tells me he has been taking medications without missing any doses.   Past Medical History:  Diagnosis Date   Acute subdural hematoma (HCC) 10/28/2019   Anxiety    Arthritis    Basal cell carcinoma    Cancer (HCC)    basal cell on nose   Cellulitis of right upper extremity 11/04/2020   Complication of anesthesia    Depression    Diabetes mellitus without complication (HCC)    Dysphagia    GERD (gastroesophageal reflux disease)    Hepatitis C    High cholesterol    History of COVID-19 05/26/2020   History of kidney stones    HIV (human immunodeficiency virus  infection) (HCC)    Hypertension    Pneumonia    PONV (postoperative nausea and vomiting)    Renal disorder    Sleep apnea    does not use CPAP   Tachycardia     Past Surgical History:  Procedure Laterality Date   ANKLE ARTHROSCOPY     APPENDECTOMY     APPLICATION OF WOUND VAC Right 03/25/2021   Procedure: APPLICATION OF WOUND VAC;  Surgeon: Shyrl Linnie KIDD, MD;  Location: [facility] OR;  Service: Thoracic;  Laterality: Right;   BACK SURGERY     FUSION   CORONARY STENT INTERVENTION N/A 10/26/2022   Procedure: CORONARY STENT INTERVENTION;  Surgeon: Anner Alm ORN, MD;  Location: [facility] INVASIVE CV LAB;  Service: Cardiovascular;  Laterality: N/A;   DEBRIDEMENT AND CLOSURE WOUND Right 05/14/2021   Procedure: Right chest wound excision;  Surgeon: Lowery Estefana RAMAN, DO;  Location: [facility] OR;  Service: Plastics;  Laterality: Right;   INCISION AND DRAINAGE Right 03/25/2021   Procedure: INCISION AND DRAINAGE OF CHEST WALL;  Surgeon: Shyrl Linnie KIDD, MD;  Location: [facility] OR;  Service: Thoracic;  Laterality: Right;   INCISION AND DRAINAGE ABSCESS Right 03/19/2021   Procedure: INCISION AND DRAINAGE RIGHT CHEST ABSCESS;  Surgeon: Shyrl Linnie KIDD, MD;  Location: [facility] OR;  Service: Vascular;  Laterality: Right;   IR US  GUIDE BX ASP/DRAIN  03/14/2021   IR US  GUIDE BX ASP/DRAIN  03/14/2021   LEFT HEART CATH AND CORONARY ANGIOGRAPHY N/A 10/26/2022   Procedure: LEFT HEART CATH AND CORONARY ANGIOGRAPHY;  Surgeon: Anner Alm ORN, MD;  Location: [facility] INVASIVE CV LAB;  Service: Cardiovascular;  Laterality: N/A;   LESION REMOVAL Left 01/09/2024   Procedure: Excision of skin lesions: left cheek, left orbital rim, left ear, left upper neck;  Surgeon: Waddell Leonce NOVAK, MD;  Location: [facility] OR;  Service: Plastics;  Laterality: Left;   PENILE PROSTHESIS  REMOVAL  05/2020   Removal due to abscess   PENILE PROSTHESIS PLACEMENT  04/2020   SKIN SPLIT GRAFT Right 05/14/2021   Procedure: possible skin graft and Matriderm;   Surgeon: Lowery Estefana RAMAN, DO;  Location: [facility] OR;  Service: Plastics;  Laterality: Right;   SKIN SPLIT GRAFT Right 06/24/2021   Procedure: SKIN GRAFT SPLIT THICKNESS TO RIGHT CHEST;  Surgeon: Lowery Estefana RAMAN, DO;  Location: [facility] OR;  Service: Plastics;  Laterality: Right;   THORACOTOMY Right 2008  ?   TONSILLECTOMY       Medications Prior to Admission: Prior to Admission medications   Medication Sig Start Date End Date Taking? Authorizing Provider  clopidogrel  (PLAVIX ) 75 MG tablet TAKE 1 TABLET BY MOUTH DAILY 11/22/23  Yes Acharya, Gayatri A, MD  acetaminophen  (TYLENOL ) 500 MG tablet Take 1,000 mg by mouth every 6 (six) hours as needed for mild pain (pain score 1-3).    [provider]  buprenorphine (BUTRANS) 20 MCG/HR PTWK Place 1 patch onto the skin daily as needed (Pain). 10/09/21   [provider]  busPIRone  (BUSPAR ) 30 MG tablet Take 30 mg by mouth at bedtime. 03/04/21   [provider]  Carboxymethylcellul-Glycerin (LUBRICATING EYE DROPS OP) Place 1 drop into both eyes daily as needed (dry eyes).    [provider]  clonazePAM  (KLONOPIN ) 0.5 MG tablet Take 0.5 mg by mouth at bedtime. Adjust with anxiety level 10/01/19   [provider]  docusate sodium  (COLACE) 250 MG capsule Take 250 mg by mouth at bedtime.    [provider]  Ensure Max Protein (ENSURE MAX PROTEIN) LIQD Take 11 oz by mouth daily.    [provider]  ezetimibe  (ZETIA ) 10 MG tablet Take 10 mg by mouth every morning.    [provider]  fluticasone  (FLONASE ) 50 MCG/ACT nasal spray Place 1 spray into both nostrils See admin instructions. Use topically for skin irritation from patch    [provider]  loratadine  (CLARITIN ) 10 MG tablet Take 10 mg by mouth every evening.    [provider]  nitroGLYCERIN  (NITROSTAT ) 0.4 MG SL tablet Place 1 tablet (0.4 mg total) under the tongue every 5 (five) minutes as needed. 10/26/22   Henry Manuelita NOVAK, NP  ondansetron  (ZOFRAN -ODT) 4 MG disintegrating tablet Take 1 tablet (4 mg total) by mouth every 8 (eight) hours as needed for nausea or vomiting. 12/16/23   Landy Honora CROME, PA-C  ondansetron  (ZOFRAN -ODT) 8 MG disintegrating tablet Take 8 mg by mouth every 8 (eight) hours as needed for nausea or vomiting.    [provider]  OZEMPIC, 0.25 OR 0.5 MG/DOSE, 2 MG/3ML SOPN Inject 0.5 mg into the skin once a week. 12/16/23   [provider]  pantoprazole  (PROTONIX ) 40 MG tablet Take 40 mg by mouth daily before breakfast.  02/12/19   [provider]  polyethylene glycol (MIRALAX  / GLYCOLAX ) 17 g packet Take 17 g by mouth daily as needed for mild constipation or moderate constipation. Capful  [provider]  propranolol  ER (INDERAL  LA) 60 MG 24 hr capsule Take 60 mg by mouth every evening. 04/28/21   [provider]  rosuvastatin  (CRESTOR ) 20 MG tablet Take 1 tablet (20 mg total) by mouth at bedtime. 10/26/22   Anner Alm ORN, MD  tamsulosin  (FLOMAX ) 0.4 MG CAPS capsule Take 0.4 mg by mouth at bedtime.  02/12/19   [provider]  TRIUMEQ  600-50-300 MG tablet Take 1 tablet by mouth daily.    [provider]     Allergies:    Allergies  Allergen Reactions   Adhesive [Tape] Other (See Comments)    Tape will take off my skin, as will Band-Aids   Cyclobenzaprine  Other (See Comments)    Hallucinations   Duloxetine  Hcl Other (See Comments)    Makes PTSD- induced nightmares more vivid    Morphine Itching and Other (See Comments)    In IV form causes hallucinations, aggression, and makes patient altered also only.  Oral morphine is ok cannot take injection     Zyvox  [Linezolid ] Nausea And Vomiting    And headaches   Pregabalin Anxiety    Pt reports insomnia and worsening anxiety     Social History:   Social History   Socioeconomic History   Marital status: Divorced    Spouse name: Not on file   Number of children:  2   Years of education: Not on file   Highest education level: Bachelor's degree (e.g., BA, AB, BS)  Occupational History   Not on file  Tobacco Use   Smoking status: Never   Smokeless tobacco: Never  Vaping Use   Vaping status: Never Used  Substance and Sexual Activity   Alcohol  use: Not Currently   Drug use: Never   Sexual activity: Not on file  Other Topics Concern   Not on file  Social History Narrative   Pt is divorced   2 children   Right handed   Drinks soda, no coffee, no tea    Lives on the third floor of an apartment bldg. One story studio apartment   Social Drivers of Health   Financial Resource Strain: Not on file  Food Insecurity: No Food Insecurity (09/04/2024)   Hunger Vital Sign    Worried About Running Out of Food in the Last Year: Never true    Ran Out of Food in the Last Year: Never true  Transportation Needs: No Transportation Needs (09/04/2024)   PRAPARE - Administrator, Civil Service (Medical): No    Lack of Transportation (Non-Medical): No  Physical Activity: Not on file  Stress: Not on file  Social Connections: Socially Isolated (09/04/2024)   Social Connection and Isolation Panel    Frequency of Communication with Friends and Family: Once a week    Frequency of Social Gatherings with Friends and Family: Once a week    Attends Religious Services: 1 to 4 times per year    Active Member of [employer] or Organizations: No    Attends Banker Meetings: Never    Marital Status: Divorced  Catering manager Violence: Not At Risk (09/04/2024)   Humiliation, Afraid, Rape, and Kick questionnaire    Fear of Current or Ex-Partner: No    Emotionally Abused: No    Physically Abused: No    Sexually Abused: No    Family History:   The patient's family history includes CAD in his brother, father, and mother; Lung cancer in his father and mother.  Review of Systems: [y] = yes, [ ]  = no    General: Weight gain [ ] ; Weight loss [ ] ; Anorexia  [ ] ; Fatigue [ ] ; Fever [ ] ; Chills [ ] ; Weakness [ ]   Cardiac: Chest pain/pressure Davis.Dad ]; Resting SOB [ ] ; Exertional SOB [ ] ; Orthopnea [ ] ; Pedal Edema [ ] ; Palpitations [ ] ; Syncope [ ] ; Presyncope [ ] ; Paroxysmal nocturnal dyspnea[ ]   Pulmonary: Cough [ ] ; Wheezing[ ] ; Hemoptysis[ ] ; Sputum [ ] ; Snoring [ ]   GI: Vomiting[ ] ; Dysphagia[ ] ; Melena[ ] ; Hematochezia [ ] ; Heartburn[ ] ; Abdominal pain [ ] ; Constipation [ ] ; Diarrhea [ ] ; BRBPR [ ]   GU: Hematuria[ ] ; Dysuria [ ] ; Nocturia[ ]   Vascular: Pain in legs with walking [ ] ; Pain in feet with lying flat [ ] ; Non-healing sores [ ] ; Stroke [ ] ; TIA [ ] ; Slurred speech [ ] ;  Neuro: Headaches[ ] ; Vertigo[ ] ; Seizures[ ] ; Paresthesias[ ] ;Blurred vision [ ] ; Diplopia [ ] ; Vision changes [ ]   Ortho/Skin: Arthritis [ ] ; Joint pain [ ] ; Muscle pain [ ] ; Joint swelling [ ] ; Back Pain [ ] ; Rash [ ]   Psych: Depression[ ] ; Anxiety[ ]   Heme: Bleeding problems [ ] ; Clotting disorders [ ] ; Anemia [ ]   Endocrine: Diabetes [ ] ; Thyroid  dysfunction[ ]   Physical Exam/Data:   Vitals:   09/04/24 2030 09/04/24 2100 09/04/24 2216 09/05/24 0056  BP: 107/69 121/74 136/87 127/84  Pulse: 94 92 94 98  Resp: 18 18 18 18   Temp:   99.8 F (37.7 C) 99.7 F (37.6 C)  TempSrc:   Oral Oral  SpO2: 98% 99% 97% 99%  Weight:   82.8 kg   Height:   6' (1.829 m)     Intake/Output Summary (Last 24 hours) at 09/05/2024 0150 Last data filed at 09/04/2024 2347 Gross per 24 hour  Intake 528.68 ml  Output 300 ml  Net 228.68 ml   Filed Weights   09/04/24 1029 09/04/24 1143 09/04/24 2216  Weight: 83.9 kg 83.9 kg 82.8 kg   Body mass index is 24.77 kg/m.  General:  Well nourished, well developed, in no acute distress HEENT: normal Neck: no JVD Cardiac:  normal S1, S2; RRR; no murmur  Lungs:  clear to auscultation bilaterally, no wheezing, rhonchi or rales  Abd: soft, nontender, no hepatomegaly  Ext: no edema Musculoskeletal:  No deformities Skin: warm and dry   Psych:  Normal affect    EKG:  Admission EKG- normal sinus rhythm at a rate of 94 bpm with normal axis and intervals. No ST segment deviation or TWI present. Personally reviewed.   Relevant CV Studies: November 2023 Coronary Angiogram  Severe ~3 Vessel CAD Culprit Lesion: Mid LAD eccentric/irregular, mildly calcified 75-85% stenosis ->  successfully treated with DES PCI: Synergy XD 2.5 x 28 postdilated to 2.8 mm. Reduced to 0%.  TIMI-3 flow pre and post. Proximal D3 (small caliber vessel 80% stenosis-at most 2 mm vessel, not favorable for PCI target) Distal AVG LCx 70 to 80% stenosis (1 mm vessel) Preserved LVEF of roughly 50 to 55% with no obvious RWMA and normal EDP.     Laboratory Data: Component     Latest Ref Rng 09/04/2024  Sodium     135 - 145 mmol/L 135   Potassium     3.5 - 5.1 mmol/L 4.8   Chloride     98 - 111 mmol/L 100   CO2     22 - 32 mmol/L 24  Glucose     70 - 99 mg/dL 848 (H)   BUN     8 - 23 mg/dL 27 (H)   Creatinine     0.61 - 1.24 mg/dL 8.62 (H)   Calcium      8.9 - 10.3 mg/dL 9.5   Anion gap     5 - 15  12   GFR, Estimated     >60 mL/min 55 (L)     Component     Latest Ref Rng 09/04/2024  WBC     4.0 - 10.5 K/uL 6.2   RBC     4.22 - 5.81 MIL/uL 4.28   Hemoglobin     13.0 - 17.0 g/dL 86.4   HCT     60.9 - 47.9 % 40.3   MCV     80.0 - 100.0 fL 94.2   MCH     26.0 - 34.0 pg 31.5   MCHC     30.0 - 36.0 g/dL 66.4   RDW     88.4 - 84.4 % 13.4   Platelets     150 - 400 K/uL 161   nRBC     0.0 - 0.2 % 0.0     Component     Latest Ref Rng 09/04/2024  Troponin T High Sensitivity     0 - 19 ng/L 117 (HH)   Troponin T High Sensitivity      60 (H)   Troponin T High Sensitivity      49 (H)     Legend: (HH) High Panic (H) High Radiology/Studies:  DG Chest 2 View Result Date: 09/04/2024 CLINICAL DATA:  Chest pain EXAM: CHEST - 2 VIEW COMPARISON:  05/16/2024 FINDINGS: Subtle ill-defined nodular opacity is seen at the right lung base.  There is ill-defined patchy opacity in the peripheral right mid lung. Left lung clear. No pleural effusion. The cardiopericardial silhouette is within normal limits for size. No acute bony abnormality. IMPRESSION: Subtle ill-defined nodular opacity at the right lung base with ill-defined patchy opacity in the peripheral right mid lung. Imaging features could be related to pneumonia although close follow-up recommended to ensure resolution. Electronically Signed   By: Camellia Candle M.D.   On: 09/04/2024 11:24    Assessment and Plan:  Brian Malone presents to the ED with chest pain found to have an NSTEMI.  # NSTEMI  - Recurrence of chest pain symptoms with elevated troponin.  Non-STEMI is working diagnosis. - Continue aspirin  81 mg daily and home Plavix  75 mg daily.  On heparin  drip.  N.p.o. for left heart catheterization today.  Risk and benefits explained.  He is willing to proceed. - Lipids are well-controlled.  We will check an LPA.  His most recent LDL was 36.  Continue Crestor  20 mg daily. - Echocardiogram ordered. - We will order an A1c.  Check TSH. - Continue home propranolol .  Informed Consent   Shared Decision Making/Informed Consent The risks [stroke (1 in 1000), death (1 in 1000), kidney failure [usually temporary] (1 in 500), bleeding (1 in 200), allergic reaction [possibly serious] (1 in 200)], benefits (diagnostic support and management of coronary artery disease) and alternatives of a cardiac catheterization were discussed in detail with Brian Malone and he is willing to proceed.      # HIV - Continue home antiretrovirals.  # HLD - Continue Crestor  20 mg daily.  Continue Zetia  10 mg daily.  # Anxiety - Home meds  FEN - Precath fluids - DVT PPx:  Heparin  drip - Code: Full - Disposition: Pending left heart catheterization today.  Severity of Illness: The appropriate patient status for this patient is INPATIENT. Inpatient status is judged to be reasonable and necessary in  order to provide the required intensity of service to ensure the patient's safety. The patient's presenting symptoms, physical exam findings, and initial radiographic and laboratory data in the context of their chronic comorbidities is felt to place them at high risk for further clinical deterioration. Furthermore, it is not anticipated that the patient will be medically stable for discharge from the hospital within 2 midnights of admission.   * I certify that at the point of admission it is my clinical judgment that the patient will require inpatient hospital care spanning beyond 2 midnights from the point of admission due to high intensity of service, high risk for further deterioration and high frequency of surveillance required.*   For questions or updates, please contact [facility] Please consult [email or web address] for contact info under   Signed, Darryle T. Barbaraann, MD, [facility]  [facility]  [address] [place], [place] [address] [phone]  9:13 AM

## 2024-09-05 NOTE — TOC CM/SW Note (Signed)
 Transition of Care [facility]) - Inpatient Brief Assessment   Patient Details  Name: Brian Malone MRN: [id number] Date of Birth: [birth year]  Transition of Care [facility]) CM/SW Contact:    Sudie Erminio Deems, RN Phone Number: 09/05/2024, 12:03 PM   Clinical Narrative: Patient presented for chest pain-Nstemi post LHC. PTA patient was independent from home with support of [place]. Patient has PCP and no issues with transportation to appointments. No home needs identified by Inpatient Case Manager at this time.    Transition of Care Asessment: Insurance and Status: Insurance coverage has been reviewed Patient has primary care physician: Yes Home environment has been reviewed: reviewed Prior level of function:: independent Prior/Current Home Services: No current home services Social Drivers of Health Review: SDOH reviewed no interventions necessary Readmission risk has been reviewed: No Transition of care needs: no transition of care needs at this time

## 2024-09-05 NOTE — Telephone Encounter (Signed)
 Patient Product/process development scientist completed.    The patient is insured through [employer]. Patient has Medicare and is not eligible for a copay card, but may be able to apply for patient assistance or Medicare RX Payment Plan (Patient Must reach out to their plan, if eligible for payment plan), if available.    Ran test claim for ticagrelor (Brilinta) 90 mg and the current 30 day co-pay is $0.00.   This test claim was processed through [facility]- copay amounts may vary at other pharmacies due to pharmacy/plan contracts, or as the patient moves through the different stages of their insurance plan.     Morgan Arab, CPHT Pharmacy Technician III Certified Patient Advocate [facility] Patient Advocate Team Direct Number: [phone]  Fax: [phone]

## 2024-09-05 NOTE — Interval H&P Note (Signed)
 History and Physical Interval Note:  09/05/2024 2:43 PM  Brian Malone  has presented today for surgery, with the diagnosis of NSTEMI.  The various methods of treatment have been discussed with the patient and family. After consideration of risks, benefits and other options for treatment, the patient has consented to  Procedure(s): LEFT HEART CATH AND CORONARY ANGIOGRAPHY (N/A) as a surgical intervention.  The patient's history has been reviewed, patient examined, no change in status, stable for surgery.  I have reviewed the patient's chart and labs.  Questions were answered to the patient's satisfaction.     Maynor Mwangi

## 2024-09-05 NOTE — Progress Notes (Signed)
 PHARMACY - ANTICOAGULATION CONSULT NOTE  Pharmacy Consult for heparin  Indication: chest pain/ACS  Allergies  Allergen Reactions   Adhesive [Tape] Other (See Comments)    Tape will take off my skin, as will Band-Aids   Cyclobenzaprine  Other (See Comments)    Hallucinations   Duloxetine  Hcl Other (See Comments)    Makes PTSD- induced nightmares more vivid    Morphine Itching and Other (See Comments)    In IV form causes hallucinations, aggression, and makes patient altered also only.  Oral morphine is ok cannot take injection     Zyvox  [Linezolid ] Nausea And Vomiting    And headaches   Pregabalin Anxiety    Pt reports insomnia and worsening anxiety     Patient Measurements: Height: 6' (182.9 cm) Weight: 82.8 kg (182 lb 9.6 oz) IBW/kg (Calculated) : 77.6 HEPARIN  DW (KG): 82.8  Vital Signs: Temp: 97.7 F (36.5 C) (09/17 0755) Temp Source: Oral (09/17 0755) BP: 107/74 (09/17 0755) Pulse Rate: 110 (09/17 0755)  Labs: Recent Labs    09/04/24 1032 09/04/24 2023 09/05/24 0501 09/05/24 0541  HGB 13.5  --  13.6  --   HCT 40.3  --  41.2  --   PLT 161  --  154  --   LABPROT 13.0  --  13.9  --   INR 0.9  --  1.0  --   HEPARINUNFRC  --  0.27*  --  0.43  CREATININE 1.37*  --  1.34*  --     Estimated Creatinine Clearance: 56.3 mL/min (A) (by C-G formula based on SCr of 1.34 mg/dL (H)).   Medical History: Past Medical History:  Diagnosis Date   Acute subdural hematoma (HCC) 10/28/2019   Anxiety    Arthritis    Basal cell carcinoma    Cancer (HCC)    basal cell on nose   Cellulitis of right upper extremity 11/04/2020   Complication of anesthesia    Depression    Diabetes mellitus without complication (HCC)    Dysphagia    GERD (gastroesophageal reflux disease)    Hepatitis C    High cholesterol    History of COVID-19 05/26/2020   History of kidney stones    HIV (human immunodeficiency virus infection) (HCC)    Hypertension    Pneumonia    PONV  (postoperative nausea and vomiting)    Renal disorder    Sleep apnea    does not use CPAP   Tachycardia     Assessment: Brian Malone presenting with chest pain. No oral anticoagulation reported prior to admission. CBC stable. Pharmacy consulted to manage heparin  infusion.  -Heparin  level therapeutic at 0.43 -plans noted for cath  Goal of Therapy:  Heparin  level 0.3-0.7 units/ml Monitor platelets by anticoagulation protocol: Yes   Plan:  -Continue heparin  at 1200 units/hr -Will follow plans post cath  Prentice Poisson, PharmD Clinical Pharmacist **Pharmacist phone directory can now be found on [email or web address] ([email or web address]).  Listed under [facility].

## 2024-09-06 ENCOUNTER — Encounter (HOSPITAL_COMMUNITY): Payer: Self-pay | Admitting: Cardiovascular Disease

## 2024-09-06 ENCOUNTER — Other Ambulatory Visit: Payer: Self-pay

## 2024-09-06 ENCOUNTER — Other Ambulatory Visit (HOSPITAL_COMMUNITY): Payer: Self-pay

## 2024-09-06 ENCOUNTER — Emergency Department (HOSPITAL_COMMUNITY)

## 2024-09-06 ENCOUNTER — Emergency Department (HOSPITAL_COMMUNITY)
Admission: EM | Admit: 2024-09-06 | Discharge: 2024-09-06 | Disposition: A | Discharge status: Home / Self Care | Attending: Emergency Medicine | Admitting: Emergency Medicine

## 2024-09-06 ENCOUNTER — Telehealth: Payer: Self-pay | Admitting: Internal Medicine

## 2024-09-06 DIAGNOSIS — I2510 Atherosclerotic heart disease of native coronary artery without angina pectoris: Secondary | ICD-10-CM | POA: Diagnosis not present

## 2024-09-06 DIAGNOSIS — Z21 Asymptomatic human immunodeficiency virus [HIV] infection status: Secondary | ICD-10-CM | POA: Diagnosis not present

## 2024-09-06 DIAGNOSIS — Z85828 Personal history of other malignant neoplasm of skin: Secondary | ICD-10-CM | POA: Diagnosis not present

## 2024-09-06 DIAGNOSIS — I10 Essential (primary) hypertension: Secondary | ICD-10-CM | POA: Insufficient documentation

## 2024-09-06 DIAGNOSIS — Z955 Presence of coronary angioplasty implant and graft: Secondary | ICD-10-CM

## 2024-09-06 DIAGNOSIS — Z7982 Long term (current) use of aspirin: Secondary | ICD-10-CM | POA: Insufficient documentation

## 2024-09-06 DIAGNOSIS — Z8616 Personal history of COVID-19: Secondary | ICD-10-CM | POA: Insufficient documentation

## 2024-09-06 DIAGNOSIS — R7989 Other specified abnormal findings of blood chemistry: Secondary | ICD-10-CM | POA: Insufficient documentation

## 2024-09-06 DIAGNOSIS — R0602 Shortness of breath: Secondary | ICD-10-CM | POA: Diagnosis present

## 2024-09-06 DIAGNOSIS — E119 Type 2 diabetes mellitus without complications: Secondary | ICD-10-CM | POA: Insufficient documentation

## 2024-09-06 DIAGNOSIS — I214 Non-ST elevation (NSTEMI) myocardial infarction: Secondary | ICD-10-CM | POA: Diagnosis not present

## 2024-09-06 LAB — BASIC METABOLIC PANEL WITH GFR
Anion gap: 14 (ref 5–15)
Anion gap: 12 (ref 5–15)
BUN: 24 mg/dL — ABNORMAL HIGH (ref 8–23)
BUN: 29 mg/dL — ABNORMAL HIGH (ref 8–23)
CO2: 22 mmol/L (ref 22–32)
CO2: 22 mmol/L (ref 22–32)
Calcium: 9.1 mg/dL (ref 8.9–10.3)
Calcium: 9.6 mg/dL (ref 8.9–10.3)
Chloride: 101 mmol/L (ref 98–111)
Chloride: 103 mmol/L (ref 98–111)
Creatinine, Ser: 1.58 mg/dL — ABNORMAL HIGH (ref 0.61–1.24)
Creatinine, Ser: 1.58 mg/dL — ABNORMAL HIGH (ref 0.61–1.24)
GFR, Estimated: 47 mL/min — ABNORMAL LOW (ref >60)
GFR, Estimated: 47 mL/min — ABNORMAL LOW (ref >60)
Glucose, Bld: 135 mg/dL — ABNORMAL HIGH (ref 70–99)
Glucose, Bld: 111 mg/dL — ABNORMAL HIGH (ref 70–99)
Potassium: 4 mmol/L (ref 3.5–5.1)
Potassium: 4.1 mmol/L (ref 3.5–5.1)
Sodium: 137 mmol/L (ref 135–145)
Sodium: 137 mmol/L (ref 135–145)

## 2024-09-06 LAB — CBC
HCT: 40.2 % (ref 39.0–52.0)
HCT: 44.9 % (ref 39.0–52.0)
Hemoglobin: 13.2 g/dL (ref 13.0–17.0)
Hemoglobin: 14.8 g/dL (ref 13.0–17.0)
MCH: 31.2 pg (ref 26.0–34.0)
MCH: 31.4 pg (ref 26.0–34.0)
MCHC: 32.8 g/dL (ref 30.0–36.0)
MCHC: 33 g/dL (ref 30.0–36.0)
MCV: 95 fL (ref 80.0–100.0)
MCV: 95.1 fL (ref 80.0–100.0)
Platelets: 150 K/uL (ref 150–400)
Platelets: 180 K/uL (ref 150–400)
RBC: 4.23 MIL/uL (ref 4.22–5.81)
RBC: 4.72 MIL/uL (ref 4.22–5.81)
RDW: 13.5 % (ref 11.5–15.5)
RDW: 13.5 % (ref 11.5–15.5)
WBC: 5.3 K/uL (ref 4.0–10.5)
WBC: 6.2 K/uL (ref 4.0–10.5)
nRBC: 0 % (ref 0.0–0.2)
nRBC: 0 % (ref 0.0–0.2)

## 2024-09-06 LAB — HEMOGLOBIN A1C
Hgb A1c MFr Bld: 6.3 % — ABNORMAL HIGH (ref 4.8–5.6)
Mean Plasma Glucose: 134.11 mg/dL

## 2024-09-06 LAB — TROPONIN I (HIGH SENSITIVITY)
Troponin I (High Sensitivity): 228 ng/L (ref <18)
Troponin I (High Sensitivity): 218 ng/L (ref <18)

## 2024-09-06 LAB — HELIX PHARMACOGENOMICS (PGX) CLOPIDOGREL TEST

## 2024-09-06 LAB — PROTIME-INR
INR: 1 (ref 0.8–1.2)
Prothrombin Time: 14 s (ref 11.4–15.2)

## 2024-09-06 LAB — TSH: TSH: 3.401 u[IU]/mL (ref 0.350–4.500)

## 2024-09-06 LAB — LIPOPROTEIN A (LPA): Lipoprotein (a): 52.9 nmol/L — ABNORMAL HIGH (ref <75.0)

## 2024-09-06 MED ORDER — EMPAGLIFLOZIN 10 MG PO TABS
10.0000 mg | ORAL_TABLET | Freq: Every day | ORAL | 3 refills | Status: AC
  Filled 2024-09-06: qty 90, 90d supply, fill #0

## 2024-09-06 MED ORDER — EMPAGLIFLOZIN 10 MG PO TABS
10.0000 mg | ORAL_TABLET | Freq: Every day | ORAL | Status: DC
  Administered 2024-09-06: 10 mg via ORAL
  Filled 2024-09-06: qty 1

## 2024-09-06 MED ORDER — TICAGRELOR 90 MG PO TABS
180.0000 mg | ORAL_TABLET | Freq: Once | ORAL | Status: AC
  Administered 2024-09-06: 180 mg via ORAL
  Filled 2024-09-06: qty 2

## 2024-09-06 MED ORDER — NITROGLYCERIN 0.4 MG SL SUBL
0.4000 mg | SUBLINGUAL_TABLET | SUBLINGUAL | 2 refills | Status: AC | PRN
  Filled 2024-09-06: qty 25, 4d supply, fill #0

## 2024-09-06 MED ORDER — TICAGRELOR 90 MG PO TABS: 90.0000 mg | ORAL_TABLET | Freq: Two times a day (BID) | ORAL | Status: DC

## 2024-09-06 MED ORDER — ASPIRIN 81 MG PO TBEC
81.0000 mg | DELAYED_RELEASE_TABLET | Freq: Every day | ORAL | 3 refills | Status: AC
  Filled 2024-09-06: qty 90, 90d supply, fill #0

## 2024-09-06 MED ORDER — TICAGRELOR 90 MG PO TABS
90.0000 mg | ORAL_TABLET | Freq: Two times a day (BID) | ORAL | 3 refills | Status: DC
  Filled 2024-09-06: qty 180, 90d supply, fill #0

## 2024-09-06 NOTE — Progress Notes (Signed)
 CARDIAC REHAB PHASE I   PRE:  Rate/Rhythm: 74 NSR  BP:  Sitting: 109/68      SpO2: 98 RA  MODE:  Ambulation: 240 ft    POST:  Rate/Rhythm: 91 NSR  BP:  Sitting: 122/82      SpO2: 98 RA  Pt amb with with supervision assist in the hallway. Pt denies chest pain or other unusual symptoms.  Pt was educated on NSTEMI, stent card, stent location, Antiplatelet and ASA use, wt restrictions, no baths/daily wash-ups, s/s of infection, ex guidelines, s/s to stop exercising, NTG use and calling 911, heart healthy diet, risk factors and CRPII. Pt received MI book and materials on exercise, diet, and CRPII. Will refer to [facility].   Pt is very active, pt is undecided on if he will do CR.   Garen FORBES Candy  MS, ACSM-CEP 9:11 AM 09/06/2024    Service time is from 0830 to 0911.

## 2024-09-06 NOTE — Telephone Encounter (Signed)
 Pt c/o medication issue:  1. Name of Medication:   ticagrelor  (BRILINTA ) 90 MG TABS tablet    empagliflozin  (JARDIANCE ) 10 MG TABS tablet    2. How are you currently taking this medication (dosage and times per day)?    3. Are you having a reaction (difficulty breathing--STAT)? no  4. What is your medication issue? Sob, feels like a panic attack. Patient also states that he feels exteremly hot. Please advise

## 2024-09-06 NOTE — Telephone Encounter (Signed)
 Received call transferred directly from operator and spoke with patient.  He sounds very short of breath on the phone.  Patient reports he was discharged from the hospital today and has been home for about 30 minutes.  Started on Brilinta  and Jardiance  today prior to discharge.  Patient reports shortness of breath started since he arrived home.  He is also feeling hot and like he may be having a panic attack.  He is not having any chest pain. I advised patient he should call 911 for transport to the hospital due to the severe shortness of breath he is having.

## 2024-09-06 NOTE — Discharge Instructions (Signed)
 You were seen in the emergency department after an episode of shortness of breath.  You had blood work chest x-ray and EKG.  Cardiology evaluated you and although it could be related to the new medication ticagrelor  they are recommending you continue it at this time and follow-up in the clinic as scheduled.  Return to the emergency department if any worsening or concerning symptoms

## 2024-09-06 NOTE — ED Triage Notes (Signed)
 Pt with a recent NSTEMI, had a cath yesterday with a stent placed. Discharged at noon today and went home, took a shower and felt sudden onset of SHOB.  Denies pain. Hx of anxiety but reports this doesn't feel similar. Took a klonopin  just in case and reports he now feels 75% better. Still mild SHOB.

## 2024-09-06 NOTE — Discharge Summary (Signed)
 Discharge Summary   Patient ID: Brian Malone MRN: [id number]; DOB: 20-Oct-1951  Admit date: 09/04/2024 Discharge date: 09/06/2024  PCP:  Nikki Hansel Atlas, MD   [facility] Providers Cardiologist:  Soyla DELENA Merck, MD     Discharge Diagnoses  Principal Problem:   NSTEMI (non-ST elevated myocardial infarction) [facility]) Active Problems:   CAD (coronary artery disease)   S/P drug eluting coronary stent placement   HIV (human immunodeficiency virus infection) (HCC)   Anxiety disorder   Depression   Essential hypertension   Uncontrolled type 2 diabetes mellitus with hyperglycemia (HCC)   Mixed diabetic hyperlipidemia associated with type 2 diabetes mellitus (HCC)   GERD without esophagitis  Diagnostic Studies/Procedures   Cardiac catheterization, 09/05/2024   LPAV lesion is 80% stenosed.   Prox RCA to Mid RCA lesion is 30% stenosed.   2nd Mrg lesion is 30% stenosed.   Dist Cx lesion is 95% stenosed.   1st LPL lesion is 70% stenosed.   2nd Diag lesion is 70% stenosed.   1st Diag lesion is 50% stenosed.   Previously placed Mid LAD to Dist LAD stent of unknown type is  widely patent.   A drug-eluting stent was successfully placed using a STENT SYNERGY XD 2.50X20.   Post intervention, there is a 0% residual stenosis.   In the absence of any other complications or medical issues, we expect the patient to be ready for discharge from an interventional cardiology perspective on 09/06/2024.   Recommend uninterrupted dual antiplatelet therapy with Aspirin  81mg  daily and Clopidogrel  75mg  daily for a minimum of 12 months (ACS-Class I recommendation). Widely patent LAD stent with no significant restenosis.  There is significant progression of distal left circumflex stenosis with hazy appearance suggestive of plaque rupture.  This is the likely culprit for myocardial infarction.  In addition, there is borderline bifurcation disease involving LPL and a small AV groove left  circumflex. Left ventricular angiography was not performed.  EF was normal by echo.  Normal left ventricular end-diastolic pressure. Successful angioplasty and drug-eluting stent placement into the distal left circumflex extending into LPL 1 ostium and jailing a small AV groove left circumflex.   Recommendations Aggressive medical therapy. Will observe overnight given mild chest discomfort at the end of the case. Recommend uninterrupted dual antiplatelet therapy with Aspirin  81mg  daily and Clopidogrel  75mg  daily for a minimum of 12 months   Echocardiogram, 09/05/2024 Left ventricular ejection fraction, by estimation, is 60 to 65% . The left ventricle has normal function. The left ventricle has no regional wall motion abnormalities. There is mild left ventricular hypertrophy. Left ventricular diastolic parameters are consistent with Grade I diastolic dysfunction ( impaired relaxation) Right ventricular systolic function is normal. The right ventricular size is normal. The mitral valve is normal in structure. No evidence of mitral valve regurgitation. No evidence of mitral stenosis. The aortic valve is tricuspid. Aortic valve regurgitation is mild. No aortic stenosis is present.  The inferior vena cava is normal in size with greater than 50% respiratory variability, suggesting right atrial pressure of 3 mmHg. _____________   History of Present Illness   Brian Malone is a 73 y.o. male with known three-vessel CAD with prior PCI to LAD, HIV, history of CVA, diabetes, hyperlipidemia, anxiety presented to [facility] with chest pain with associated diaphoresis, DOE.  He was transferred to [facility] for further evaluation and treatment of his chest pain.  Hospital Course   After being transferred to [facility],  he underwent cardiac catheterization.  Full report as above, it resulted in successful angioplasty and DES placement of the distal left circumflex.   Recommendations are uninterrupted DAPT, ASA 81 mg + Plavix  75 mg daily for at least 12 months.   He tolerated the catheterization well, seen the next morning, right radial site healing well, no significant bruising or hematoma present.  Overall he is doing well, eager to go home.  I discussed his medication regimen is planned, he does note that previously he was on aspirin  and it caused him to have gout flares, he has not had any issues since receiving the aspirin  while he is here.  He is willing to try it as an outpatient and will report back if he is not able to tolerate. Treatment plan at discharge is as follows below:  CAD s/p NSTEMI, with DES to left circumflex Continue DAPT -- ASA + Brilinta  for at least 12 months Brilinta  is replacing Plavix  due to cost  Starting Jardiance  10 mg daily  Continue home propranolol  60 mg nightly Continue Crestor  20 mg , Zetia  10 mg daily  Continue sublingual nitroglycerin  PRN  Hyperlipidemia Continue Crestor  20 mg daily Continue Zetia  10 mg daily  Diabetes Continue home meds at discharge Starting Jardiance  10 mg daily  Follow-up with PCP  HIV Continue home antiretrovirals at discharge Follow-up with PCP  Anxiety Continue home meds at discharge Follow-up with PCP    Did the patient have an acute coronary syndrome (MI, NSTEMI, STEMI, etc) this admission?:  Yes                               AHA/ACC ACS Clinical Performance & Quality Measures: Aspirin  prescribed? - Yes ADP Receptor Inhibitor (Plavix /Clopidogrel , Brilinta /Ticagrelor  or Effient/Prasugrel) prescribed (includes medically managed patients)? - Yes Beta Blocker prescribed? - Yes High Intensity Statin (Lipitor 40-80mg  or Crestor  20-40mg ) prescribed? - Yes EF assessed during THIS hospitalization? - Yes For EF <40%, was ACEI/ARB prescribed? - Not Applicable (EF >/= 40%) For EF <40%, Aldosterone Antagonist (Spironolactone or Eplerenone) prescribed? - Not Applicable (EF >/= 40%) Cardiac  Rehab Phase II ordered (including medically managed patients)? - Yes   _____________  Discharge Vitals Blood pressure 109/68, pulse 71, temperature 98 F (36.7 C), temperature source Oral, resp. rate 18, height 6' (1.829 m), weight 82.8 kg, SpO2 95%.  Filed Weights   09/04/24 1029 09/04/24 1143 09/04/24 2216  Weight: 83.9 kg 83.9 kg 82.8 kg   Physical Exam General:  in no acute distress HEENT: normal Neck: no JVD Cardiac:  RRR; no murmur, right radial site healing well Lungs:  clear to auscultation bilaterally Abd: soft, nontender Ext: no edema Musculoskeletal:  No deformities Skin: warm and dry  Psych:  Normal affect   Labs & Radiologic Studies  CBC Recent Labs    09/05/24 0501 09/06/24 0417  WBC 5.4 5.3  HGB 13.6 13.2  HCT 41.2 40.2  MCV 95.8 95.0  PLT 154 150   Basic Metabolic Panel Recent Labs    90/83/74 2241 09/05/24 0501 09/06/24 0417  NA  --  138 137  K  --  4.1 4.0  CL  --  104 101  CO2  --  25 22  GLUCOSE  --  119* 135*  BUN  --  26* 24*  CREATININE  --  1.34* 1.58*  CALCIUM   --  8.9 9.1  MG 2.3  --   --    Liver  Function Tests No results for input(s): AST, ALT, ALKPHOS, BILITOT, PROT, ALBUMIN in the last 72 hours. No results for input(s): LIPASE, AMYLASE in the last 72 hours. High Sensitivity Troponin:   No results for input(s): TROPONINIHS in the last 720 hours.  Recent Labs  Lab 09/04/24 1032 09/04/24 1212 09/04/24 2023  TRNPT 49* 60* 117*    BNP Invalid input(s): POCBNP No results for input(s): PROBNP in the last 72 hours.  No results for input(s): BNP in the last 72 hours.  D-Dimer No results for input(s): DDIMER in the last 72 hours. Hemoglobin A1C Recent Labs    09/06/24 0417  HGBA1C 6.3*   Fasting Lipid Panel Recent Labs    09/05/24 0501  CHOL 123  HDL 47  LDLCALC 36  TRIG 202*  CHOLHDL 2.6   Lipoprotein (a)  Date/Time Value Ref Range Status  09/05/2024 05:01 AM 52.9 (H) <75.0 nmol/L  Final    Comment:    (NOTE) This test was developed and its performance characteristics determined by [facility]. It has not been cleared or approved by the Food and Drug Administration. Note:  Values greater than or equal to 75.0 nmol/L may       indicate an independent risk factor for CHD,       but must be evaluated with caution when applied       to non-Caucasian populations due to the       influence of genetic factors on Lp(a) across       ethnicities. Performed At: [facility] [address] [place], [place] [id number] Jennette Shorter MD Ey:[id number]     Thyroid  Function Tests Recent Labs    09/06/24 0417  TSH 3.401  _____________  CARDIAC CATHETERIZATION Result Date: 09/05/2024   LPAV lesion is 80% stenosed.   Prox RCA to Mid RCA lesion is 30% stenosed.   2nd Mrg lesion is 30% stenosed.   Dist Cx lesion is 95% stenosed.   1st LPL lesion is 70% stenosed.   2nd Diag lesion is 70% stenosed.   1st Diag lesion is 50% stenosed.   Previously placed Mid LAD to Dist LAD stent of unknown type is  widely patent.   A drug-eluting stent was successfully placed using a STENT SYNERGY XD 2.50X20.   Post intervention, there is a 0% residual stenosis.   In the absence of any other complications or medical issues, we expect the patient to be ready for discharge from an interventional cardiology perspective on 09/06/2024.   Recommend uninterrupted dual antiplatelet therapy with Aspirin  81mg  daily and Clopidogrel  75mg  daily for a minimum of 12 months (ACS-Class I recommendation). 1.  Widely patent LAD stent with no significant restenosis.  There is significant progression of distal left circumflex stenosis with hazy appearance suggestive of plaque rupture.  This is the likely culprit for myocardial infarction.  In addition, there is borderline bifurcation disease involving LPL and a small AV groove left circumflex. 2.  Left ventricular angiography was not performed.  EF was normal by echo.  Normal  left ventricular end-diastolic pressure. 3.  Successful angioplasty and drug-eluting stent placement into the distal left circumflex extending into LPL 1 ostium and jailing a small AV groove left circumflex. Recommendations Aggressive medical therapy. Will observe overnight given mild chest discomfort at the end of the case.   ECHOCARDIOGRAM COMPLETE Result Date: 09/05/2024    ECHOCARDIOGRAM REPORT   Patient Name:   ROME ECHAVARRIA Date of Exam: 09/05/2024 Medical Rec #:  [id number]  Height:       72.0 in Accession #:    [id number]        Weight:       182.6 lb Date of Birth:  02-11-1951        BSA:          2.050 m Patient Age:    72 years          BP:           107/74 mmHg Patient Gender: M                 HR:           89 bpm. Exam Location:  Inpatient Procedure: 2D Echo, Cardiac Doppler and Color Doppler (Both Spectral and Color            Flow Doppler were utilized during procedure). Indications:    NSTEMI  History:        Patient has prior history of Echocardiogram examinations, most                 recent 11/23/2019. Risk Factors:Hypertension, Diabetes and                 Dyslipidemia.  Sonographer:    Philomena Daring Referring Phys: [id number] NKIRU C OSUDE IMPRESSIONS  1. Left ventricular ejection fraction, by estimation, is 60 to 65%. The left ventricle has normal function. The left ventricle has no regional wall motion abnormalities. There is mild left ventricular hypertrophy. Left ventricular diastolic parameters are consistent with Grade I diastolic dysfunction (impaired relaxation).  2. Right ventricular systolic function is normal. The right ventricular size is normal.  3. The mitral valve is normal in structure. No evidence of mitral valve regurgitation. No evidence of mitral stenosis.  4. The aortic valve is tricuspid. Aortic valve regurgitation is mild. No aortic stenosis is present.  5. The inferior vena cava is normal in size with greater than 50% respiratory variability, suggesting right  atrial pressure of 3 mmHg. FINDINGS  Left Ventricle: Left ventricular ejection fraction, by estimation, is 60 to 65%. The left ventricle has normal function. The left ventricle has no regional wall motion abnormalities. The left ventricular internal cavity size was normal in size. There is  mild left ventricular hypertrophy. Left ventricular diastolic parameters are consistent with Grade I diastolic dysfunction (impaired relaxation). Normal left ventricular filling pressure. Right Ventricle: The right ventricular size is normal. No increase in right ventricular wall thickness. Right ventricular systolic function is normal. Left Atrium: Left atrial size was normal in size. Right Atrium: Right atrial size was normal in size. Pericardium: There is no evidence of pericardial effusion. Mitral Valve: The mitral valve is normal in structure. There is moderate thickening of the mitral valve leaflet(s). No evidence of mitral valve regurgitation. No evidence of mitral valve stenosis. Tricuspid Valve: The tricuspid valve is normal in structure. Tricuspid valve regurgitation is not demonstrated. No evidence of tricuspid stenosis. Aortic Valve: The aortic valve is tricuspid. Aortic valve regurgitation is mild. No aortic stenosis is present. Pulmonic Valve: The pulmonic valve was normal in structure. Pulmonic valve regurgitation is not visualized. No evidence of pulmonic stenosis. Aorta: The aortic root is normal in size and structure. Venous: The inferior vena cava is normal in size with greater than 50% respiratory variability, suggesting right atrial pressure of 3 mmHg. IAS/Shunts: No atrial level shunt detected by color flow Doppler.  LEFT VENTRICLE PLAX 2D LVIDd:         3.50  cm   Diastology LVIDs:         2.40 cm   LV e' medial:    7.18 cm/s LV PW:         1.00 cm   LV E/e' medial:  6.4 LV IVS:        1.30 cm   LV e' lateral:   9.90 cm/s LVOT diam:     2.10 cm   LV E/e' lateral: 4.6 LV SV:         62 LV SV Index:   30 LVOT  Area:     3.46 cm  RIGHT VENTRICLE             IVC RV S prime:     11.30 cm/s  IVC diam: 1.20 cm TAPSE (M-mode): 1.7 cm LEFT ATRIUM           Index        RIGHT ATRIUM           Index LA diam:      3.80 cm 1.85 cm/m   RA Area:     11.80 cm LA Vol (A4C): 29.9 ml 14.59 ml/m  RA Volume:   23.00 ml  11.22 ml/m  AORTIC VALVE LVOT Vmax:   94.90 cm/s LVOT Vmean:  68.000 cm/s LVOT VTI:    0.179 m  AORTA Ao Root diam: 3.20 cm Ao Asc diam:  3.80 cm MITRAL VALVE MV Area (PHT): 4.63 cm    SHUNTS MV Decel Time: 164 msec    Systemic VTI:  0.18 m MV E velocity: 45.60 cm/s  Systemic Diam: 2.10 cm MV A velocity: 84.40 cm/s MV E/A ratio:  0.54 Annabella Scarce MD Electronically signed by Annabella Scarce MD Signature Date/Time: 09/05/2024/1:57:16 PM    Final    DG Chest 2 View Result Date: 09/04/2024 CLINICAL DATA:  Chest pain EXAM: CHEST - 2 VIEW COMPARISON:  05/16/2024 FINDINGS: Subtle ill-defined nodular opacity is seen at the right lung base. There is ill-defined patchy opacity in the peripheral right mid lung. Left lung clear. No pleural effusion. The cardiopericardial silhouette is within normal limits for size. No acute bony abnormality. IMPRESSION: Subtle ill-defined nodular opacity at the right lung base with ill-defined patchy opacity in the peripheral right mid lung. Imaging features could be related to pneumonia although close follow-up recommended to ensure resolution. Electronically Signed   By: Camellia Candle M.D.   On: 09/04/2024 11:24   Disposition Pt is being discharged home today in good condition per MD.  Follow-up Plans & Appointments  Future Appointments  Date Time Provider Department Center  09/26/2024 10:05 AM Janene Boer, PA [facility]  02/14/2025  1:15 PM Paci, Rufus HERO, MD [facility] None    Discharge Instructions     Amb Referral to Cardiac Rehabilitation   Complete by: As directed    Diagnosis:  Coronary Stents NSTEMI     After initial evaluation and assessments completed: Virtual  Based Care may be provided alone or in conjunction with Phase 2 Cardiac Rehab based on patient barriers.: Yes   Intensive Cardiac Rehabilitation (ICR) [facility] location only OR Traditional Cardiac Rehabilitation (TCR) *If criteria for ICR are not met will enroll in TCR [facility] only): Yes   Call MD for:  redness, tenderness, or signs of infection (pain, swelling, redness, odor or green/yellow discharge around incision site)   Complete by: As directed    Discharge instructions   Complete by: As directed    PLEASE DO NOT MISS ANY DOSES  OF YOUR BRILINTA !!!!! Also keep a log of you blood pressures and bring back to your follow up appt. Please call the office with any questions.   Patients taking blood thinners should generally stay away from medicines like ibuprofen, Advil, Motrin, naproxen , and Aleve  due to risk of stomach bleeding. You may take Tylenol  as directed or talk to your primary doctor about alternatives.  PLEASE ENSURE THAT YOU DO NOT RUN OUT OF YOUR BRILINTA . This medication is very important to remain on for at least one year. IF you have issues obtaining this medication due to cost please CALL the office 3-5 business days prior to running out in order to prevent missing doses of this medication.   Radial Site Care Refer to this sheet in the next few weeks. These instructions provide you with information on caring for yourself after your procedure. Your caregiver may also give you more specific instructions. Your treatment has been planned according to current medical practices, but problems sometimes occur. Call your caregiver if you have any problems or questions after your procedure.  HOME CARE INSTRUCTIONS You may shower the day after the procedure. Remove the bandage (dressing) and gently wash the site with plain soap and water . Gently pat the site dry.  Do not apply powder or lotion to the site.  Do not submerge the affected site in water  for 3 to 5 days.  Inspect the site at least twice  daily.  Do not flex or bend the affected arm for 24 hours.  No lifting over 5 pounds (2.3 kg) for 5 days after your procedure.  Do not drive home if you are discharged the same day of the procedure. Have someone else drive you.  You may drive 24 hours after the procedure unless otherwise instructed by your caregiver.   What to expect: Any bruising will usually fade within 1 to 2 weeks.  Blood that collects in the tissue (hematoma) may be painful to the touch. It should usually decrease in size and tenderness within 1 to 2 weeks.   SEEK IMMEDIATE MEDICAL CARE IF: You have unusual pain at the radial site.  You have redness, warmth, swelling, or pain at the radial site.  You have drainage (other than a small amount of blood on the dressing).  You have chills.  You have a fever or persistent symptoms for more than 72 hours.  You have a fever and your symptoms suddenly get worse.  Your arm becomes pale, cool, tingly, or numb.  You have heavy bleeding from the site. Hold pressure on the site.      Discharge Medications Allergies as of 09/06/2024       Reactions   Adhesive [tape] Other (See Comments)   Skin tears easily   Cymbalta  [duloxetine  Hcl] Other (See Comments)   PTSD nightmares more vivid   Flexeril  [cyclobenzaprine ] Other (See Comments)   Hallucinations   Ms Contin [morphine] Itching, Other (See Comments)   IV formulation causes hallucinations, aggression and altered mental state Opioid-induced pruritus OK with tablet formulation, no side effects   Zyvox  [linezolid ] Nausea And Vomiting, Other (See Comments)   Headaches   Lyrica [pregabalin] Anxiety, Other (See Comments)   Insomnia         Medication List     STOP taking these medications    clopidogrel  75 MG tablet Commonly known as: PLAVIX        TAKE these medications    acetaminophen  500 MG tablet Commonly known as: TYLENOL  Take 1,000 mg  by mouth 2 (two) times daily as needed for headache (pain).    aspirin  EC 81 MG tablet Take 1 tablet (81 mg total) by mouth daily. Swallow whole. Start taking on: September 07, 2024   busPIRone  30 MG tablet Commonly known as: BUSPAR  Take 30 mg by mouth See admin instructions. Take 1 tablet (30mg ) by mouth every evening. May take an additional 1 tablet during the day if needed for anxiety.   clonazePAM  0.5 MG tablet Commonly known as: KLONOPIN  Take 0.5-1 mg by mouth See admin instructions. Take 1-2 tablets (0.5-1mg ) by mouth every evening and at bedtime. Adjust dose to anxiety level. May take up to 4 tablets in 24 hours.   docusate sodium  100 MG capsule Commonly known as: COLACE Take 100 mg by mouth every evening.   empagliflozin  10 MG Tabs tablet Commonly known as: JARDIANCE  Take 1 tablet (10 mg total) by mouth daily. Start taking on: September 07, 2024   Ensure Max Protein Liqd Take 11 oz by mouth daily.   ezetimibe  10 MG tablet Commonly known as: ZETIA  Take 10 mg by mouth every morning.   finasteride 5 MG tablet Commonly known as: PROSCAR Take 5 mg by mouth every evening.   loratadine  10 MG tablet Commonly known as: CLARITIN  Take 10 mg by mouth every evening.   LUBRICATING EYE DROPS OP Place 1 drop into both eyes daily as needed (dry eyes).   nitroGLYCERIN  0.4 MG SL tablet Commonly known as: Nitrostat  Place 1 tablet (0.4 mg total) under the tongue every 5 (five) minutes as needed.   ondansetron  4 MG disintegrating tablet Commonly known as: ZOFRAN -ODT Take 1 tablet (4 mg total) by mouth every 8 (eight) hours as needed for nausea or vomiting.   Ozempic (0.25 or 0.5 MG/DOSE) 2 MG/3ML Sopn Generic drug: Semaglutide(0.25 or 0.5MG /DOS) Inject 0.5 mg into the skin every Wednesday.   pantoprazole  40 MG tablet Commonly known as: PROTONIX  Take 40 mg by mouth daily.   polyethylene glycol 17 g packet Commonly known as: MIRALAX  / GLYCOLAX  Take 8.5-17 g by mouth daily before breakfast.   propranolol  ER 60 MG 24 hr  capsule Commonly known as: INDERAL  LA Take 60 mg by mouth every evening.   rosuvastatin  20 MG tablet Commonly known as: CRESTOR  Take 1 tablet (20 mg total) by mouth at bedtime. What changed: when to take this   tamsulosin  0.4 MG Caps capsule Commonly known as: FLOMAX  Take 0.4 mg by mouth every evening.   ticagrelor  90 MG Tabs tablet Commonly known as: BRILINTA  Take 1 tablet (90 mg total) by mouth 2 (two) times daily. Start taking on: September 07, 2024   Triumeq  600-50-300 MG tablet Generic drug: abacavir -dolutegravir -lamiVUDine  Take 1 tablet by mouth every evening.        Duration of Discharge Encounter: APP Time: 20 minutes   Signed, Waddell DELENA Donath, PA-C 09/06/2024, 10:25 AM

## 2024-09-06 NOTE — Plan of Care (Signed)
 Problem: Education: Goal: Knowledge of General Education information will improve Description: Including pain rating scale, medication(s)/side effects and non-pharmacologic comfort measures 09/06/2024 1054 by Brian Fairy SAUNDERS, RN Outcome: Adequate for Discharge 09/06/2024 1054 by Brian Fairy SAUNDERS, RN Outcome: Adequate for Discharge   Problem: Health Behavior/Discharge Planning: Goal: Ability to manage health-related needs will improve 09/06/2024 1054 by Brian Fairy SAUNDERS, RN Outcome: Adequate for Discharge 09/06/2024 1054 by Brian Fairy SAUNDERS, RN Outcome: Adequate for Discharge   Problem: Clinical Measurements: Goal: Ability to maintain clinical measurements within normal limits will improve 09/06/2024 1054 by Brian Fairy SAUNDERS, RN Outcome: Adequate for Discharge 09/06/2024 1054 by Brian Fairy SAUNDERS, RN Outcome: Adequate for Discharge Goal: Will remain free from infection 09/06/2024 1054 by Brian Fairy SAUNDERS, RN Outcome: Adequate for Discharge 09/06/2024 1054 by Brian Fairy SAUNDERS, RN Outcome: Adequate for Discharge Goal: Diagnostic test results will improve 09/06/2024 1054 by Brian Fairy SAUNDERS, RN Outcome: Adequate for Discharge 09/06/2024 1054 by Brian Fairy SAUNDERS, RN Outcome: Adequate for Discharge Goal: Respiratory complications will improve 09/06/2024 1054 by Brian Fairy SAUNDERS, RN Outcome: Adequate for Discharge 09/06/2024 1054 by Brian Fairy SAUNDERS, RN Outcome: Adequate for Discharge Goal: Cardiovascular complication will be avoided 09/06/2024 1054 by Brian Fairy SAUNDERS, RN Outcome: Adequate for Discharge 09/06/2024 1054 by Brian Fairy SAUNDERS, RN Outcome: Adequate for Discharge   Problem: Activity: Goal: Risk for activity intolerance will decrease 09/06/2024 1054 by Brian Fairy SAUNDERS, RN Outcome: Adequate for Discharge 09/06/2024 1054 by Brian Fairy SAUNDERS, RN Outcome: Adequate for Discharge   Problem: Nutrition: Goal: Adequate nutrition will be maintained 09/06/2024 1054 by  Brian Fairy SAUNDERS, RN Outcome: Adequate for Discharge 09/06/2024 1054 by Brian Fairy SAUNDERS, RN Outcome: Adequate for Discharge   Problem: Coping: Goal: Level of anxiety will decrease 09/06/2024 1054 by Brian Fairy SAUNDERS, RN Outcome: Adequate for Discharge 09/06/2024 1054 by Brian Fairy SAUNDERS, RN Outcome: Adequate for Discharge   Problem: Elimination: Goal: Will not experience complications related to bowel motility 09/06/2024 1054 by Brian Fairy SAUNDERS, RN Outcome: Adequate for Discharge 09/06/2024 1054 by Brian Fairy SAUNDERS, RN Outcome: Adequate for Discharge Goal: Will not experience complications related to urinary retention 09/06/2024 1054 by Brian Fairy SAUNDERS, RN Outcome: Adequate for Discharge 09/06/2024 1054 by Brian Fairy SAUNDERS, RN Outcome: Adequate for Discharge   Problem: Pain Managment: Goal: General experience of comfort will improve and/or be controlled 09/06/2024 1054 by Brian Fairy SAUNDERS, RN Outcome: Adequate for Discharge 09/06/2024 1054 by Brian Fairy SAUNDERS, RN Outcome: Adequate for Discharge   Problem: Safety: Goal: Ability to remain free from injury will improve 09/06/2024 1054 by Brian Fairy SAUNDERS, RN Outcome: Adequate for Discharge 09/06/2024 1054 by Brian Fairy SAUNDERS, RN Outcome: Adequate for Discharge   Problem: Skin Integrity: Goal: Risk for impaired skin integrity will decrease 09/06/2024 1054 by Brian Fairy SAUNDERS, RN Outcome: Adequate for Discharge 09/06/2024 1054 by Brian Fairy SAUNDERS, RN Outcome: Adequate for Discharge   Problem: Education: Goal: Understanding of cardiac disease, CV risk reduction, and recovery process will improve 09/06/2024 1054 by Brian Fairy SAUNDERS, RN Outcome: Adequate for Discharge 09/06/2024 1054 by Brian Fairy SAUNDERS, RN Outcome: Adequate for Discharge Goal: Individualized Educational Video(s) 09/06/2024 1054 by Brian Fairy SAUNDERS, RN Outcome: Adequate for Discharge 09/06/2024 1054 by Brian Fairy SAUNDERS, RN Outcome: Adequate for  Discharge   Problem: Activity: Goal: Ability to tolerate increased activity will improve 09/06/2024 1054 by Brian Fairy SAUNDERS, RN Outcome: Adequate for Discharge 09/06/2024 1054 by Brian Fairy SAUNDERS, RN Outcome: Adequate for Discharge  Problem: Cardiac: Goal: Ability to achieve and maintain adequate cardiovascular perfusion will improve 09/06/2024 1054 by Brian Fairy SAUNDERS, RN Outcome: Adequate for Discharge 09/06/2024 1054 by Brian Fairy SAUNDERS, RN Outcome: Adequate for Discharge   Problem: Health Behavior/Discharge Planning: Goal: Ability to safely manage health-related needs after discharge will improve 09/06/2024 1054 by Brian Fairy SAUNDERS, RN Outcome: Adequate for Discharge 09/06/2024 1054 by Brian Fairy SAUNDERS, RN Outcome: Adequate for Discharge   Problem: Education: Goal: Understanding of CV disease, CV risk reduction, and recovery process will improve 09/06/2024 1054 by Brian Fairy SAUNDERS, RN Outcome: Adequate for Discharge 09/06/2024 1054 by Brian Fairy SAUNDERS, RN Outcome: Adequate for Discharge Goal: Individualized Educational Video(s) 09/06/2024 1054 by Brian Fairy SAUNDERS, RN Outcome: Adequate for Discharge 09/06/2024 1054 by Brian Fairy SAUNDERS, RN Outcome: Adequate for Discharge   Problem: Activity: Goal: Ability to return to baseline activity level will improve 09/06/2024 1054 by Brian Fairy SAUNDERS, RN Outcome: Adequate for Discharge 09/06/2024 1054 by Brian Fairy SAUNDERS, RN Outcome: Adequate for Discharge   Problem: Cardiovascular: Goal: Ability to achieve and maintain adequate cardiovascular perfusion will improve 09/06/2024 1054 by Brian Fairy SAUNDERS, RN Outcome: Adequate for Discharge 09/06/2024 1054 by Brian Fairy SAUNDERS, RN Outcome: Adequate for Discharge Goal: Vascular access site(s) Level 0-1 will be maintained 09/06/2024 1054 by Brian Fairy SAUNDERS, RN Outcome: Adequate for Discharge 09/06/2024 1054 by Brian Fairy SAUNDERS, RN Outcome: Adequate for Discharge   Problem:  Health Behavior/Discharge Planning: Goal: Ability to safely manage health-related needs after discharge will improve 09/06/2024 1054 by Brian Fairy SAUNDERS, RN Outcome: Adequate for Discharge 09/06/2024 1054 by Brian Fairy SAUNDERS, RN Outcome: Adequate for Discharge

## 2024-09-06 NOTE — ED Provider Notes (Signed)
 [place] EMERGENCY DEPARTMENT AT [facility] Provider Note   CSN: [id number] Arrival date & time: 09/06/24  1723     Patient presents with: Shortness of Breath   Brian Malone is a 73 y.o. male.  He was just discharged from the hospital today after being admitted on the 16th for an NSTEMI.  He had a cardiac catheterization and a stent was placed.  His cardiac complaint at that time was chest pain and exertional.  He said he went home today and felt a little nauseous.  Tried to eat something to feel better but became very dizzy and short of breath.  Felt like he was going to die he was so short of breath.  Has a remote history of panic attacks, took Klonopin  just in case.  Feels improved.  Called cardiology who recommended he come for further evaluation.  Denies any chest pain with this episode.  {Add pertinent medical, surgical, social history, OB history to YEP:67052} The history is provided by the patient.  Shortness of Breath Severity:  Severe Onset quality:  Sudden Duration:  1 hour Timing:  Constant Progression:  Improving Chronicity:  New Associated symptoms: no abdominal pain, no chest pain, no cough, no fever, no hemoptysis, no sore throat and no vomiting        Prior to Admission medications   Medication Sig Start Date End Date Taking? Authorizing Provider  acetaminophen  (TYLENOL ) 500 MG tablet Take 1,000 mg by mouth 2 (two) times daily as needed for headache (pain).    [provider]  aspirin  EC 81 MG tablet Take 1 tablet (81 mg total) by mouth daily. Swallow whole. 09/07/24   Parcells, Waddell LABOR, PA-C  busPIRone  (BUSPAR ) 30 MG tablet Take 30 mg by mouth See admin instructions. Take 1 tablet (30mg ) by mouth every evening. May take an additional 1 tablet during the day if needed for anxiety. 03/04/21   [provider]  Carboxymethylcellul-Glycerin (LUBRICATING EYE DROPS OP) Place 1 drop into both eyes daily as needed (dry eyes).    [provider]  clonazePAM  (KLONOPIN ) 0.5 MG tablet Take 0.5-1 mg by mouth See admin instructions. Take 1-2 tablets (0.5-1mg ) by mouth every evening and at bedtime. Adjust dose to anxiety level. May take up to 4 tablets in 24 hours. 10/01/19   [provider]  docusate sodium  (COLACE) 100 MG capsule Take 100 mg by mouth every evening.    [provider]  empagliflozin  (JARDIANCE ) 10 MG TABS tablet Take 1 tablet (10 mg total) by mouth daily. 09/07/24   Parcells, Waddell LABOR, PA-C  Ensure Max Protein (ENSURE MAX PROTEIN) LIQD Take 11 oz by mouth daily.    [provider]  ezetimibe  (ZETIA ) 10 MG tablet Take 10 mg by mouth every morning.    [provider]  finasteride (PROSCAR) 5 MG tablet Take 5 mg by mouth every evening.    [provider]  loratadine  (CLARITIN ) 10 MG tablet Take 10 mg by mouth every evening.    [provider]  nitroGLYCERIN  (NITROSTAT ) 0.4 MG SL tablet Place 1 tablet (0.4 mg total) under the tongue every 5 (five) minutes as needed. 09/06/24   Parcells, Waddell LABOR, PA-C  ondansetron  (ZOFRAN -ODT) 4 MG disintegrating tablet Take 1 tablet (4 mg total) by mouth every 8 (eight) hours as needed for nausea or vomiting. 12/16/23   Landy Honora CROME, PA-C  OZEMPIC, 0.25 OR 0.5 MG/DOSE, 2 MG/3ML SOPN Inject 0.5 mg into the skin every Wednesday.  12/16/23   [provider]  pantoprazole  (PROTONIX ) 40 MG tablet Take 40 mg by mouth daily. 02/12/19   [provider]  polyethylene glycol (MIRALAX  / GLYCOLAX ) 17 g packet Take 8.5-17 g by mouth daily before breakfast.    [provider]  propranolol  ER (INDERAL  LA) 60 MG 24 hr capsule Take 60 mg by mouth every evening. 04/28/21   [provider]  rosuvastatin  (CRESTOR ) 20 MG tablet Take 1 tablet (20 mg total) by mouth at bedtime. Patient taking differently: Take 20 mg by mouth every evening. 10/26/22   Anner Alm ORN, MD  tamsulosin  (FLOMAX ) 0.4 MG CAPS capsule Take 0.4  mg by mouth every evening. 02/12/19   [provider]  ticagrelor  (BRILINTA ) 90 MG TABS tablet Take 1 tablet (90 mg total) by mouth 2 (two) times daily. 09/07/24   Parcells, Waddell LABOR, PA-C  TRIUMEQ  600-50-300 MG tablet Take 1 tablet by mouth every evening.    [provider]    Allergies: Adhesive [tape], Cymbalta  [duloxetine  hcl], Flexeril  [cyclobenzaprine ], Ms contin [morphine], Zyvox  [linezolid ], and Lyrica [pregabalin]    Review of Systems  Constitutional:  Negative for fever.  HENT:  Negative for sore throat.   Respiratory:  Positive for shortness of breath. Negative for cough and hemoptysis.   Cardiovascular:  Negative for chest pain.  Gastrointestinal:  Negative for abdominal pain and vomiting.    Updated Vital Signs BP (!) 129/95   Pulse 88   Temp 98.1 F (36.7 C)   Resp 16   Ht 6' (1.829 m)   Wt 82.8 kg   SpO2 100%   BMI 24.76 kg/m   Physical Exam Vitals and nursing note reviewed.  Constitutional:      Appearance: Normal appearance. He is well-developed.  HENT:     Head: Normocephalic and atraumatic.  Eyes:     Conjunctiva/sclera: Conjunctivae normal.  Cardiovascular:     Rate and Rhythm: Normal rate and regular rhythm.     Heart sounds: No murmur heard. Pulmonary:     Effort: Pulmonary effort is normal. No respiratory distress.     Breath sounds: Normal breath sounds.  Abdominal:     Palpations: Abdomen is soft.     Tenderness: There is no abdominal tenderness. There is no guarding or rebound.  Musculoskeletal:     Cervical back: Neck supple.     Right lower leg: No tenderness. No edema.     Left lower leg: No tenderness. No edema.  Skin:    General: Skin is warm and dry.  Neurological:     General: No focal deficit present.     Mental Status: He is alert.     GCS: GCS eye subscore is 4. GCS verbal subscore is 5. GCS motor subscore is 6.     (all labs ordered are listed, but only abnormal results are displayed) Labs Reviewed  BASIC  METABOLIC PANEL WITH GFR  CBC  PROTIME-INR  TROPONIN I (HIGH SENSITIVITY)    EKG: None  Radiology: CARDIAC CATHETERIZATION Result Date: 09/05/2024   LPAV lesion is 80% stenosed.   Prox RCA to Mid RCA lesion is 30% stenosed.   2nd Mrg lesion is 30% stenosed.   Dist Cx lesion is 95% stenosed.   1st LPL lesion is 70% stenosed.   2nd Diag lesion is 70% stenosed.   1st Diag lesion is 50% stenosed.   Previously placed Mid LAD to Dist LAD stent of unknown type is  widely patent.   A drug-eluting  stent was successfully placed using a STENT SYNERGY XD 2.50X20.   Post intervention, there is a 0% residual stenosis.   In the absence of any other complications or medical issues, we expect the patient to be ready for discharge from an interventional cardiology perspective on 09/06/2024.   Recommend uninterrupted dual antiplatelet therapy with Aspirin  81mg  daily and Clopidogrel  75mg  daily for a minimum of 12 months (ACS-Class I recommendation). 1.  Widely patent LAD stent with no significant restenosis.  There is significant progression of distal left circumflex stenosis with hazy appearance suggestive of plaque rupture.  This is the likely culprit for myocardial infarction.  In addition, there is borderline bifurcation disease involving LPL and a small AV groove left circumflex. 2.  Left ventricular angiography was not performed.  EF was normal by echo.  Normal left ventricular end-diastolic pressure. 3.  Successful angioplasty and drug-eluting stent placement into the distal left circumflex extending into LPL 1 ostium and jailing a small AV groove left circumflex. Recommendations Aggressive medical therapy. Brian observe overnight given mild chest discomfort at the end of the case.   ECHOCARDIOGRAM COMPLETE Result Date: 09/05/2024    ECHOCARDIOGRAM REPORT   Patient Name:   EJ PINSON Date of Exam: 09/05/2024 Medical Rec #:  [id number]         Height:       72.0 in Accession #:    [id number]        Weight:        182.6 lb Date of Birth:  02/28/1951        BSA:          2.050 m Patient Age:    72 years          BP:           107/74 mmHg Patient Gender: M                 HR:           89 bpm. Exam Location:  Inpatient Procedure: 2D Echo, Cardiac Doppler and Color Doppler (Both Spectral and Color            Flow Doppler were utilized during procedure). Indications:    NSTEMI  History:        Patient has prior history of Echocardiogram examinations, most                 recent 11/23/2019. Risk Factors:Hypertension, Diabetes and                 Dyslipidemia.  Sonographer:    Philomena Daring Referring Phys: [id number] NKIRU C OSUDE IMPRESSIONS  1. Left ventricular ejection fraction, by estimation, is 60 to 65%. The left ventricle has normal function. The left ventricle has no regional wall motion abnormalities. There is mild left ventricular hypertrophy. Left ventricular diastolic parameters are consistent with Grade I diastolic dysfunction (impaired relaxation).  2. Right ventricular systolic function is normal. The right ventricular size is normal.  3. The mitral valve is normal in structure. No evidence of mitral valve regurgitation. No evidence of mitral stenosis.  4. The aortic valve is tricuspid. Aortic valve regurgitation is mild. No aortic stenosis is present.  5. The inferior vena cava is normal in size with greater than 50% respiratory variability, suggesting right atrial pressure of 3 mmHg. FINDINGS  Left Ventricle: Left ventricular ejection fraction, by estimation, is 60 to 65%. The left ventricle has normal function. The left ventricle has no regional wall motion abnormalities.  The left ventricular internal cavity size was normal in size. There is  mild left ventricular hypertrophy. Left ventricular diastolic parameters are consistent with Grade I diastolic dysfunction (impaired relaxation). Normal left ventricular filling pressure. Right Ventricle: The right ventricular size is normal. No increase in right ventricular wall  thickness. Right ventricular systolic function is normal. Left Atrium: Left atrial size was normal in size. Right Atrium: Right atrial size was normal in size. Pericardium: There is no evidence of pericardial effusion. Mitral Valve: The mitral valve is normal in structure. There is moderate thickening of the mitral valve leaflet(s). No evidence of mitral valve regurgitation. No evidence of mitral valve stenosis. Tricuspid Valve: The tricuspid valve is normal in structure. Tricuspid valve regurgitation is not demonstrated. No evidence of tricuspid stenosis. Aortic Valve: The aortic valve is tricuspid. Aortic valve regurgitation is mild. No aortic stenosis is present. Pulmonic Valve: The pulmonic valve was normal in structure. Pulmonic valve regurgitation is not visualized. No evidence of pulmonic stenosis. Aorta: The aortic root is normal in size and structure. Venous: The inferior vena cava is normal in size with greater than 50% respiratory variability, suggesting right atrial pressure of 3 mmHg. IAS/Shunts: No atrial level shunt detected by color flow Doppler.  LEFT VENTRICLE PLAX 2D LVIDd:         3.50 cm   Diastology LVIDs:         2.40 cm   LV e' medial:    7.18 cm/s LV PW:         1.00 cm   LV E/e' medial:  6.4 LV IVS:        1.30 cm   LV e' lateral:   9.90 cm/s LVOT diam:     2.10 cm   LV E/e' lateral: 4.6 LV SV:         62 LV SV Index:   30 LVOT Area:     3.46 cm  RIGHT VENTRICLE             IVC RV S prime:     11.30 cm/s  IVC diam: 1.20 cm TAPSE (M-mode): 1.7 cm LEFT ATRIUM           Index        RIGHT ATRIUM           Index LA diam:      3.80 cm 1.85 cm/m   RA Area:     11.80 cm LA Vol (A4C): 29.9 ml 14.59 ml/m  RA Volume:   23.00 ml  11.22 ml/m  AORTIC VALVE LVOT Vmax:   94.90 cm/s LVOT Vmean:  68.000 cm/s LVOT VTI:    0.179 m  AORTA Ao Root diam: 3.20 cm Ao Asc diam:  3.80 cm MITRAL VALVE MV Area (PHT): 4.63 cm    SHUNTS MV Decel Time: 164 msec    Systemic VTI:  0.18 m MV E velocity: 45.60 cm/s   Systemic Diam: 2.10 cm MV A velocity: 84.40 cm/s MV E/A ratio:  0.54 Annabella Scarce MD Electronically signed by Annabella Scarce MD Signature Date/Time: 09/05/2024/1:57:16 PM    Final     {Document cardiac monitor, telemetry assessment procedure when appropriate:32947} Procedures   Medications Ordered in the ED - No data to display    {Click here for ABCD2, HEART and other calculators REFRESH Note before signing:1}  Medical Decision Making Amount and/or Complexity of Data Reviewed Labs: ordered. Radiology: ordered.   This patient complains of ***; this involves an extensive number of treatment Options and is a complaint that carries with it a high risk of complications and morbidity. The differential includes ***  I ordered, reviewed and interpreted labs, which included *** I ordered medication *** and reviewed PMP when indicated. I ordered imaging studies which included *** and I independently    visualized and interpreted imaging which showed *** Additional history obtained from *** Previous records obtained and reviewed *** I consulted *** and discussed lab and imaging findings and discussed disposition.  Cardiac monitoring reviewed, *** Social determinants considered, *** Critical Interventions: ***  After the interventions stated above, I reevaluated the patient and found *** Admission and further testing considered, ***   {Document critical care time when appropriate  Document review of labs and clinical decision tools ie CHADS2VASC2, etc  Document your independent review of radiology images and any outside records  Document your discussion with family members, caretakers and with consultants  Document social determinants of health affecting pt's care  Document your decision making why or why not admission, treatments were needed:32947:::1}   Final diagnoses:  None    ED Discharge Orders     None

## 2024-09-07 ENCOUNTER — Ambulatory Visit: Admitting: Physician Assistant

## 2024-09-07 MED ORDER — CLOPIDOGREL BISULFATE 75 MG PO TABS: ORAL_TABLET | ORAL | 3 refills | Status: AC

## 2024-09-07 NOTE — Telephone Encounter (Signed)
 Shortness of breath can occur and 1 out of 7 individuals on Brilinta .  If taking with caffeine is not acceptable to the patient, we need to consider alternate antiplatelet agents.  With his history of stroke, Effient is contraindicated.  Therefore, we would have to use Plavix .  Recommend the following:  12 hours after last dose of Brilinta , take Plavix  600 mg (2 tablets of 300 mg) at once (Day 1) From day 2 onwards, take Plavix  75 mg daily.  Recommend giving 2 separate prescriptions of Plavix  with specific directions as above.  Continue aspirin  81 mg daily.  Thanks MJP

## 2024-09-07 NOTE — Telephone Encounter (Signed)
 Pt called in regarding medication and stating that the medication is causing sob, anxiety. Pt was seen at the ER on 09/06/24 and per the hospitalist that the symptoms caused by the medication. He was told to take it again today which he did at 10 am and after three hours he experienced the same symptoms. Pt would like a call back to discuss if he should stop these medications. Please advise

## 2024-09-07 NOTE — Telephone Encounter (Signed)
 Called patient back with Dr. Gilda advisement. Patient agreed to plan as written below.  Shortness of breath can occur and 1 out of 7 individuals on Brilinta .  If taking with caffeine is not acceptable to the patient, we need to consider alternate antiplatelet agents.  With his history of stroke, Effient is contraindicated.  Therefore, we would have to use Plavix .  Recommend the following:   12 hours after last dose of Brilinta , take Plavix  600 mg (2 tablets of 300 mg) at once (Day 1) From day 2 onwards, take Plavix  75 mg daily.   Recommend giving 2 separate prescriptions of Plavix  with specific directions as above.   Continue aspirin  81 mg daily.   Thanks MJP

## 2024-09-07 NOTE — Telephone Encounter (Addendum)
 Spoke to patient he stated he recently had a coronary stent.He was prescribed Brilinta  90 mg twice a day.Stated Brilinta  is causing sob.Stated when he gets sob he has increased anxiety.He went to ED last night and was told to call back today if he had sob after Brilinta .Stated he tried calling this morning and was unable to get through.Stated he had sob after taking Brilinta  this morning.Advised to try drinking a cup of coffee before taking.Stated he really does not want to drink coffee.Advised to try.I will send message to Dr.Acharya for advice. Dr.Acharya out of office this afternoon.I will send message to DOD Dr.Patwardhan for advice.

## 2024-09-07 NOTE — Consult Note (Signed)
 Cardiology Consult Note:   Patient ID: Brian Malone MRN: [id number]; DOB: 22-Oct-1951   Admission date: 09/06/2024  Primary Care Provider: Nikki Rams, Aliene, MD Primary Cardiologist: Soyla DELENA Merck, MD  Primary Electrophysiologist:  None   Patient Profile:   Brian Malone is a 73 year old with a history of HIV, hepatitis C status posttreatment, CAD status post PCI to his circumflex on 09/05/2024 History of Present Illness:   Brian Malone presents to the emergency room with breathlessness. He was discharged earlier today after obtaining a PCI yesterday. He last had a PCI a two years prior and so he was still on aspirin  and Plavix . This morning, the decision was made to transition him from Plavix  to ticagrelor  given his advanced disease while on DAPT. He states he got home and started having some abdominal pain. Two hours later he began to feel really breathless and had shortness of breath. This was associated with anxiety however he believes that the anxiety came afterwards. He has a history of panic attacks and states that this felt a little different than his his body's typical manifestation of anxiety.  Past Medical History:  Diagnosis Date   Acute subdural hematoma (HCC) 10/28/2019   Anxiety    Arthritis    Basal cell carcinoma    Cancer (HCC)    basal cell on nose   Cellulitis of right upper extremity 11/04/2020   Complication of anesthesia    Depression    Diabetes mellitus without complication (HCC)    Dysphagia    GERD (gastroesophageal reflux disease)    Hepatitis C    High cholesterol    History of COVID-19 05/26/2020   History of kidney stones    HIV (human immunodeficiency virus infection) (HCC)    Hypertension    Pneumonia    PONV (postoperative nausea and vomiting)    Renal disorder    Sleep apnea    does not use CPAP   Tachycardia     Past Surgical History:  Procedure Laterality Date   ANKLE ARTHROSCOPY     APPENDECTOMY     APPLICATION OF WOUND VAC  Right 03/25/2021   Procedure: APPLICATION OF WOUND VAC;  Surgeon: Shyrl Linnie KIDD, MD;  Location: [facility] OR;  Service: Thoracic;  Laterality: Right;   BACK SURGERY     FUSION   CORONARY STENT INTERVENTION N/A 10/26/2022   Procedure: CORONARY STENT INTERVENTION;  Surgeon: Anner Alm ORN, MD;  Location: [facility] INVASIVE CV [facility];  Service: Cardiovascular;  Laterality: N/A;   CORONARY STENT INTERVENTION N/A 09/05/2024   Procedure: CORONARY STENT INTERVENTION;  Surgeon: Darron Deatrice DELENA, MD;  Location: [facility];  Service: Cardiovascular;  Laterality: N/A;   DEBRIDEMENT AND CLOSURE WOUND Right 05/14/2021   Procedure: Right chest wound excision;  Surgeon: Lowery Estefana RAMAN, DO;  Location: [facility] OR;  Service: Plastics;  Laterality: Right;   INCISION AND DRAINAGE Right 03/25/2021   Procedure: INCISION AND DRAINAGE OF CHEST WALL;  Surgeon: Shyrl Linnie KIDD, MD;  Location: [facility];  Service: Thoracic;  Laterality: Right;   INCISION AND DRAINAGE ABSCESS Right 03/19/2021   Procedure: INCISION AND DRAINAGE RIGHT CHEST ABSCESS;  Surgeon: Shyrl Linnie KIDD, MD;  Location: [facility];  Service: Vascular;  Laterality: Right;   IR US  GUIDE BX ASP/DRAIN  03/14/2021   IR US  GUIDE BX ASP/DRAIN  03/14/2021   LEFT HEART CATH AND CORONARY ANGIOGRAPHY N/A 10/26/2022   Procedure: LEFT HEART CATH AND CORONARY ANGIOGRAPHY;  Surgeon: Anner Alm ORN, MD;  Location: [facility]  INVASIVE CV LAB;  Service: Cardiovascular;  Laterality: N/A;   LEFT HEART CATH AND CORONARY ANGIOGRAPHY N/A 09/05/2024   Procedure: LEFT HEART CATH AND CORONARY ANGIOGRAPHY;  Surgeon: Darron Deatrice LABOR, MD;  Location: [facility];  Service: Cardiovascular;  Laterality: N/A;   LESION REMOVAL Left 01/09/2024   Procedure: Excision of skin lesions: left cheek, left orbital rim, left ear, left upper neck;  Surgeon: Waddell Leonce NOVAK, MD;  Location: [facility];  Service: Plastics;  Laterality: Left;   PENILE PROSTHESIS  REMOVAL  05/2020   Removal due to abscess    PENILE PROSTHESIS PLACEMENT  04/2020   SKIN SPLIT GRAFT Right 05/14/2021   Procedure: possible skin graft and Matriderm;  Surgeon: Lowery Estefana RAMAN, DO;  Location: [facility];  Service: Plastics;  Laterality: Right;   SKIN SPLIT GRAFT Right 06/24/2021   Procedure: SKIN GRAFT SPLIT THICKNESS TO RIGHT CHEST;  Surgeon: Lowery Estefana RAMAN, DO;  Location: [facility];  Service: Plastics;  Laterality: Right;   THORACOTOMY Right 2008  ?   TONSILLECTOMY       Medications Prior to Admission: Prior to Admission medications   Medication Sig Start Date End Date Taking? Authorizing Provider  acetaminophen  (TYLENOL ) 500 MG tablet Take 1,000 mg by mouth 2 (two) times daily as needed for headache (pain).    [provider]  aspirin  EC 81 MG tablet Take 1 tablet (81 mg total) by mouth daily. Swallow whole. 09/07/24   Parcells, Waddell LABOR, PA-C  busPIRone  (BUSPAR ) 30 MG tablet Take 30 mg by mouth See admin instructions. Take 1 tablet (30mg ) by mouth every evening. May take an additional 1 tablet during the day if needed for anxiety. 03/04/21   [provider]  Carboxymethylcellul-Glycerin (LUBRICATING EYE DROPS OP) Place 1 drop into both eyes daily as needed (dry eyes).    [provider]  clonazePAM  (KLONOPIN ) 0.5 MG tablet Take 0.5-1 mg by mouth See admin instructions. Take 1-2 tablets (0.5-1mg ) by mouth every evening and at bedtime. Adjust dose to anxiety level. May take up to 4 tablets in 24 hours. 10/01/19   [provider]  docusate sodium  (COLACE) 100 MG capsule Take 100 mg by mouth every evening.    [provider]  empagliflozin  (JARDIANCE ) 10 MG TABS tablet Take 1 tablet (10 mg total) by mouth daily. 09/07/24   Parcells, Waddell LABOR, PA-C  Ensure Max Protein (ENSURE MAX PROTEIN) LIQD Take 11 oz by mouth daily.    [provider]  ezetimibe  (ZETIA ) 10 MG tablet Take 10 mg by mouth every morning.    [provider]  finasteride (PROSCAR) 5 MG tablet Take 5 mg  by mouth every evening.    [provider]  loratadine  (CLARITIN ) 10 MG tablet Take 10 mg by mouth every evening.    [provider]  nitroGLYCERIN  (NITROSTAT ) 0.4 MG SL tablet Place 1 tablet (0.4 mg total) under the tongue every 5 (five) minutes as needed. 09/06/24   Parcells, Waddell A, PA-C  ondansetron  (ZOFRAN -ODT) 4 MG disintegrating tablet Take 1 tablet (4 mg total) by mouth every 8 (eight) hours as needed for nausea or vomiting. 12/16/23   Landy Honora CROME, PA-C  OZEMPIC, 0.25 OR 0.5 MG/DOSE, 2 MG/3ML SOPN Inject 0.5 mg into the skin every Wednesday. 12/16/23   [provider]  pantoprazole  (PROTONIX ) 40 MG tablet Take 40 mg by mouth daily. 02/12/19   [provider]  polyethylene glycol (MIRALAX  / GLYCOLAX ) 17 g packet Take 8.5-17 g  by mouth daily before breakfast.    [provider]  propranolol  ER (INDERAL  LA) 60 MG 24 hr capsule Take 60 mg by mouth every evening. 04/28/21   [provider]  rosuvastatin  (CRESTOR ) 20 MG tablet Take 1 tablet (20 mg total) by mouth at bedtime. Patient taking differently: Take 20 mg by mouth every evening. 10/26/22   Anner Alm ORN, MD  tamsulosin  (FLOMAX ) 0.4 MG CAPS capsule Take 0.4 mg by mouth every evening. 02/12/19   [provider]  ticagrelor  (BRILINTA ) 90 MG TABS tablet Take 1 tablet (90 mg total) by mouth 2 (two) times daily. 09/07/24   Parcells, Waddell LABOR, PA-C  TRIUMEQ  600-50-300 MG tablet Take 1 tablet by mouth every evening.    [provider]     Allergies:    Allergies  Allergen Reactions   Adhesive [Tape] Other (See Comments)    Skin tears easily   Cymbalta  [Duloxetine  Hcl] Other (See Comments)    PTSD nightmares more vivid    Flexeril  [Cyclobenzaprine ] Other (See Comments)    Hallucinations   Ms Contin [Morphine] Itching and Other (See Comments)    IV formulation causes hallucinations, aggression and altered mental state Opioid-induced pruritus  OK with tablet  formulation, no side effects   Zyvox  [Linezolid ] Nausea And Vomiting and Other (See Comments)    Headaches   Lyrica [Pregabalin] Anxiety and Other (See Comments)    Insomnia     Social History:   Social History   Socioeconomic History   Marital status: Divorced    Spouse name: Not on file   Number of children: 2   Years of education: Not on file   Highest education level: Bachelor's degree (e.g., BA, AB, BS)  Occupational History   Not on file  Tobacco Use   Smoking status: Never   Smokeless tobacco: Never  Vaping Use   Vaping status: Never Used  Substance and Sexual Activity   Alcohol  use: Not Currently   Drug use: Never   Sexual activity: Not on file  Other Topics Concern   Not on file  Social History Narrative   Pt is divorced   2 children   Right handed   Drinks soda, no coffee, no tea    Lives on the third floor of an apartment bldg. One story studio apartment   Social Drivers of Health   Financial Resource Strain: Not on file  Food Insecurity: No Food Insecurity (09/04/2024)   Hunger Vital Sign    Worried About Running Out of Food in the Last Year: Never true    Ran Out of Food in the Last Year: Never true  Transportation Needs: No Transportation Needs (09/04/2024)   PRAPARE - Administrator, Civil Service (Medical): No    Lack of Transportation (Non-Medical): No  Physical Activity: Not on file  Stress: Not on file  Social Connections: Socially Isolated (09/04/2024)   Social Connection and Isolation Panel    Frequency of Communication with Friends and Family: Once a week    Frequency of Social Gatherings with Friends and Family: Once a week    Attends Religious Services: 1 to 4 times per year    Active Member of [employer] or Organizations: No    Attends Banker Meetings: Never    Marital Status: Divorced  Catering manager Violence: Not At Risk (09/04/2024)   Humiliation, Afraid, Rape, and Kick questionnaire    Fear of Current or  Ex-Partner: No    Emotionally Abused:  No    Physically Abused: No    Sexually Abused: No    Family History:   The patient's family history includes CAD in his brother, father, and mother; Lung cancer in his father and mother.    Review of Systems: [y] = yes, [ ]  = no    General: Weight gain [ ] ; Weight loss [ ] ; Anorexia [ ] ; Fatigue [ ] ; Fever [ ] ; Chills [ ] ; Weakness [ ]   Cardiac: Chest pain/pressure [ ] ; Resting SOB [ ] ; Exertional SOB [ ] ; Orthopnea [ ] ; Pedal Edema [ ] ; Palpitations [ ] ; Syncope [ ] ; Presyncope [ ] ; Paroxysmal nocturnal dyspnea[ ]   Pulmonary: Cough [ ] ; Wheezing[ ] ; Hemoptysis[ ] ; Sputum [ ] ; Snoring [ ] ; [ y] shortness of breath  GI: Vomiting[ ] ; Dysphagia[ ] ; Melena[ ] ; Hematochezia [ ] ; Heartburn[ ] ; Abdominal pain [ ] ; Constipation [ ] ; Diarrhea [ ] ; BRBPR [ ]   GU: Hematuria[ ] ; Dysuria [ ] ; Nocturia[ ]   Vascular: Pain in legs with walking [ ] ; Pain in feet with lying flat [ ] ; Non-healing sores [ ] ; Stroke [ ] ; TIA [ ] ; Slurred speech [ ] ;  Neuro: Headaches[ ] ; Vertigo[ ] ; Seizures[ ] ; Paresthesias[ ] ;Blurred vision [ ] ; Diplopia [ ] ; Vision changes [ ]   Ortho/Skin: Arthritis [ ] ; Joint pain [ ] ; Muscle pain [ ] ; Joint swelling [ ] ; Back Pain [ ] ; Rash [ ]   Psych: Depression[ ] ; Anxiety[ ]   Heme: Bleeding problems [ ] ; Clotting disorders [ ] ; Anemia [ ]   Endocrine: Diabetes [ ] ; Thyroid  dysfunction[ ]   Physical Exam/Data:   Vitals:   09/06/24 2100 09/06/24 2239 09/06/24 2300 09/06/24 2336  BP: 122/87 129/81 (!) 140/97 (!) 145/99  Pulse: 69 76 74 75  Resp: 15 19 15 18   Temp:  97.7 F (36.5 C)  97.8 F (36.6 C)  TempSrc:  Oral  Oral  SpO2: 100% 100% 100% 100%  Weight:      Height:       No intake or output data in the 24 hours ending 09/07/24 0016 Filed Weights   09/06/24 1725  Weight: 82.8 kg   Body mass index is 24.76 kg/m.  General:  Well nourished, well developed, in no acute distress HEENT: normal Neck: no JVD Cardiac:  normal S1, S2; RRR;   Lungs:  clear to auscultation bilaterally, no wheezing, rhonchi or rales  Abd: soft, nontender, no hepatomegaly  Ext: no edema Musculoskeletal:  No deformities Skin: warm and dry  Psych:  Normal affect    EKG: EKG on 09/06/2024 at 17:48 PM with normal sinus rhythm at a heart rate of 89 bpm with normal axis and normal intervals. No significant changes from the EKG obtained 09/05/2024 at 15:50 p.m. Personally reviewed.   Relevant CV Studies: 09/05/2024 Coronary Angiogram    LPAV lesion is 80% stenosed.   Prox RCA to Mid RCA lesion is 30% stenosed.   2nd Mrg lesion is 30% stenosed.   Dist Cx lesion is 95% stenosed.   1st LPL lesion is 70% stenosed.   2nd Diag lesion is 70% stenosed.   1st Diag lesion is 50% stenosed.   Previously placed Mid LAD to Dist LAD stent of unknown type is  widely patent.   A drug-eluting stent was successfully placed using a STENT SYNERGY XD 2.50X20.   Post intervention, there is a 0% residual stenosis.   In the absence of any other complications or medical issues, we expect the patient to be ready for  discharge from an interventional cardiology perspective on 09/06/2024.   Recommend uninterrupted dual antiplatelet therapy with Aspirin  81mg  daily and Clopidogrel  75mg  daily for a minimum of 12 months (ACS-Class I recommendation).  Laboratory Data: Component     Latest Ref Rng 09/06/2024  Sodium     135 - 145 mmol/L 137   Sodium      137   Potassium     3.5 - 5.1 mmol/L 4.1   Potassium      4.0   Chloride     98 - 111 mmol/L 103   Chloride      101   CO2     22 - 32 mmol/L 22   CO2      22   Glucose     70 - 99 mg/dL 888 (H)   Glucose      135 (H)   BUN     8 - 23 mg/dL 29 (H)   BUN      24 (H)   Creatinine     0.61 - 1.24 mg/dL 8.41 (H)   Creatinine      1.58 (H)   Calcium      8.9 - 10.3 mg/dL 9.6   Calcium       9.1   Anion gap     5 - 15  12   Anion gap      14   GFR, Estimated     >60 mL/min 47 (L)   GFR, Estimated      47 (L)     Component     Latest Ref Rng 09/06/2024  Troponin I (High Sensitivity)     <18 ng/L 218 (HH)   Troponin I (High Sensitivity)      228 (HH)    Radiology/Studies:  DG Chest Port 1 View Result Date: 09/06/2024 CLINICAL DATA:  Short of breath, recent MI and stent placement EXAM: PORTABLE CHEST 1 VIEW COMPARISON:  09/04/2024 FINDINGS: Two frontal views of the chest demonstrate an unremarkable cardiac silhouette. No acute airspace disease, effusion, or pneumothorax. The right lung density seen on prior exam are no longer visualized, and may have reflected superimposed shadows. No acute bony abnormalities. IMPRESSION: 1. No acute intrathoracic process. Electronically Signed   By: Ozell Daring M.D.   On: 09/06/2024 18:49    Assessment and Plan:   Brian Malone is a 73 year old with CAD with PCI to dLCx on yesterday on 09/06/2024 who presented to the ED with acute onset of breathlessness associated with anxiety. He was hemodynamically stable upon arrival with a respiratory rate of 16 and oxygen saturation to the 100% on room air. Chest x-ray without acute cardiopulmonary process. Labs revealed a downtrending troponin from 228 to 218 consistent with normal findings after recent PCI.  Differential includes, but not limited to, ticagrelor  induced shortness of breath, ischemia, and anxiety. We discussed that ticagrelor  induced breathlessness usually happens days to weeks or even years after initial dosing, but in experience it is not typically seen the day of the initial loading dose. Brian Malone has had panic attacks in the past and he wonders if this could be related, albeit his presenting symptoms feel little bit different than his normal manifestations of anxiety. Lastly, he did have a PCI yesterday where his distal LCx was jailed, but his anginal equivalent is chest pain and not  breathlessness and he was pain free today.   We discussed that if this symptom happens again then we should consider  transitioning  him from ticagrelor  to another P2Y12. Given his progressive CAD in the setting of HIV, he would definitely benefit from a stronger P2Y12 then clopidogrel . He has only had one dose of ticagrelor , thus I think we should not change the medication tonight. I will send a message to his cardiologist to inform her of his presentation in the event these symptoms occur again and it is concluded to be secondary to the ticagrelor .   Merlene Blood, MD MS  Cardiology [facility]

## 2024-09-14 LAB — HELIX PHARMACOGENOMICS (PGX) CLOPIDOGREL TEST

## 2024-09-21 ENCOUNTER — Ambulatory Visit: Payer: Self-pay | Admitting: Cardiovascular Disease

## 2024-09-26 ENCOUNTER — Encounter: Payer: Self-pay | Admitting: Physician Assistant

## 2024-09-26 ENCOUNTER — Ambulatory Visit: Attending: Cardiology | Admitting: Physician Assistant

## 2024-09-26 VITALS — BP 131/74 | HR 70 | Ht 72.0 in | Wt 188.0 lb

## 2024-09-26 DIAGNOSIS — I200 Unstable angina: Secondary | ICD-10-CM

## 2024-09-26 DIAGNOSIS — E785 Hyperlipidemia, unspecified: Secondary | ICD-10-CM | POA: Diagnosis present

## 2024-09-26 DIAGNOSIS — I2510 Atherosclerotic heart disease of native coronary artery without angina pectoris: Secondary | ICD-10-CM | POA: Diagnosis not present

## 2024-09-26 DIAGNOSIS — I10 Essential (primary) hypertension: Secondary | ICD-10-CM | POA: Diagnosis present

## 2024-09-26 NOTE — Progress Notes (Unsigned)
 Cardiology Office Note   Date:  09/26/2024  ID:  Anis, Degidio [birth year], MRN [id number] PCP: Nikki Rams, Aliene, MD  [facility] Providers Cardiologist:  Soyla DELENA Merck, MD { Click to update primary MD,subspecialty MD or APP then REFRESH:1}    History of Present Illness FOCH ROSENWALD is a 73 y.o. male with past medical history of hypertension, hyperlipidemia, DM2, CKD stage III, PTSD, HIV, treated hepatitis C and history of CAD. Echocardiogram obtained on 11/23/2019 showed EF 50 to 55%, no LVH, mild AI.  Coronary CT obtained on the same day showed 25 to 49% proximal LAD, 25 to 49% mid LAD, 50 to 69% mid to distal LAD lesion, 50 to 69% mid left circumflex lesion, >70% distal left circumflex disease, greater than 70% OM 2 lesion, 25 to 49% OM1 lesion.  He was treated medically for small vessel disease that did not appear to be amenable to intervention. He later underwent cardiac catheterization on 10/26/2022 that showed 70% mid to distal LAD with FFR of 0.76, treated with 2.5 x 28 mm Synergy DES, 80% D2 lesion, 80% LPAV lesion, 50% proximal to mid RCA lesion, EF 50 to 55%.  He was last seen by Dr. Merck in September 2024 at which time he was doing well.  Patient was readmitted with exertional chest pain, dyspnea with exertion and diaphoresis on 09/05/2024.  He was ruled in for NSTEMI.  Echocardiogram obtained on the same day showed EF 60 to 65%, no regional wall motion abnormality, grade 1 DD, normal RV.  Cardiac catheterization showed 80% RPAV lesion, 95% distal left circumflex lesion, 70% D2 lesion, 50% D1 lesion, widely patent LAD stent with no significant restenosis.  The distal left circumflex lesion had a hazy appearance suggestive of plaque rupture, this was treated with a 2.5 x 20 mm Synergy DES.  The drug-eluting stent extended from distal left circumflex artery into the LPL 1 ostium and the jailed small AV groove left circumflex vessel.  Postprocedure, it was  recommended for the patient to take aspirin  and Brilinta  for 12 months.  He was also started on Jardiance . Lipid panel obtained on 9/17 showed total cholesterol 123, HDL 47, LDL 47, lipoprotein a 52, triglyceride 202.  Since discharge, patient returned to the hospital on 09/06/2024 for shortness of breath.  Serial troponin 228-->218.  EKG was reassuring.  Chest x-ray normal.  He was eventually loaded on Plavix  600 mg followed by 75 mg daily.  Shortness of breath has not recurred again.  Patient presents today for follow-up.  He denies any chest pain or shortness of breath.  He is quite concerned about the progression of coronary artery disease.  Recent [facility] clopidogrel  test showed he is responding well to Plavix  therapy.  He has no lower extremity edema.  His lung is clear.  He can follow-up with Dr. Merck in 3-4 months.    ROS: ***  Studies Reviewed      *** Risk Assessment/Calculations {Does this patient have ATRIAL FIBRILLATION?:[phone]} No BP recorded.  {Refresh Note OR Click here to enter BP  :1}***       Physical Exam VS:  There were no vitals taken for this visit.       Wt Readings from Last 3 Encounters:  09/06/24 182 lb 8.7 oz (82.8 kg)  09/04/24 182 lb 9.6 oz (82.8 kg)  01/09/24 180 lb (81.6 kg)    GEN: Well nourished, well developed in no acute distress NECK: No JVD; No carotid  bruits CARDIAC: ***RRR, no murmurs, rubs, gallops RESPIRATORY:  Clear to auscultation without rales, wheezing or rhonchi  ABDOMEN: Soft, non-tender, non-distended EXTREMITIES:  No edema; No deformity   ASSESSMENT AND PLAN *** {The patient has an active order for outpatient cardiac rehabilitation.   Please indicate if the patient is ready to start. Do NOT delete this.  It will auto delete.  Refresh note, then sign.              Click here to document readiness and see contraindications.  :1}  Cardiac Rehabilitation Eligibility Assessment      {Are you ordering a CV  Procedure (e.g. stress test, cath, DCCV, TEE, etc)?   Press F2        :[phone]}  Dispo: ***  Signed, Scot Ford, PA

## 2024-09-26 NOTE — Patient Instructions (Addendum)
 Medication Instructions:  NO CHANGES  Lab Work: NONE TO BE DONE TODAY.  Testing/Procedures: NONE  Follow-Up: At [facility], you and your health needs are our priority.  As part of our continuing mission to provide you with exceptional heart care, our providers are all part of one team.  This team includes your primary Cardiologist (physician) and Advanced Practice Providers or APPs (Physician Assistants and Nurse Practitioners) who all work together to provide you with the care you need, when you need it.  Your next appointment:   3-4 MONTHS  Provider:   Gayatri A Acharya, MD

## 2024-10-03 ENCOUNTER — Telehealth (HOSPITAL_COMMUNITY): Payer: Self-pay

## 2024-10-03 NOTE — Telephone Encounter (Signed)
 Called patient regarding cardiac rehab, patient states he is not interested at this time as he exercises and eats right. Informed patient if he changes his mind he has 1 year to use his referral.  Closing referral.

## 2024-11-21 ENCOUNTER — Other Ambulatory Visit (HOSPITAL_COMMUNITY): Payer: Self-pay

## 2024-12-11 ENCOUNTER — Other Ambulatory Visit (HOSPITAL_COMMUNITY): Payer: Self-pay

## 2024-12-28 NOTE — Progress Notes (Signed)
 Physical Therapy Visit - Daily Note   Payor: MEDICARE / Plan: MEDICARE A&B / Product Type: Medicare /   Visit Count: 14   Rehabilitation Precautions/Restrictions:   Precautions/Restrictions Precautions: Hx of HIV*, Heart attack, anemia, CKD, Hepatitis, and DM, stents implanted 1 month ago        Referring Diagnosis: M54.16 - Lumbar radiculopathy   SUBJECTIVE Patient Report:   Pt states he still has good and bad days. He states that the other day the L leg was bothering him but the R leg felt a little better  Change in Status Since Last Visit: No  Pain:     OBJECTIVE  General Observation/Objective Measures:         Resting in anterior pelvic tilt        Interventions:   Neuromuscular Re-education - PPT with heel slides 2x5' ea. - Dead bugs LE only 2x2' ea. - Posterior pelvic tilts with BP cuff 2x5' - BKFO 2x3' ea. With PPT    Education: Discussed HEP and POC    ASSESSMENT Pt tolerated today's session fair. He continues to present with high irritability. We shifted our focus to more core work and core endurance work to see if we can get better carry over. We will progress as tolerated.    Therapy Diagnosis:     ICD-10-CM   1. Low back pain, unspecified back pain laterality, unspecified chronicity, unspecified whether sciatica present  M54.50   2. Radiculopathy, lumbar region  M54.16     Progress Towards Goals:   Progressing   Goals Addressed             This Visit's Progress    PT Goal       Short Term Goals(STG): Time Frame: 8 visits   STG 1: Pt will be I with HEP in order to promote ADLs and ROM   STG 2: Pt will demonstrate 50% lumbar flexion without increase in symptoms   STG 3: Pt will demonstrate ability to sit and stand for 30 minutes without increase in symptoms    Long Term Goals(LTG): Time Frame 16 visits   LTG: 1: Pt will demonstrate 50% lumbar rotation ROM bil without increase in symptoms   LTG 2: Pt will demonstrate ability to  stand and walk for 30 minutes without increase in symptoms   LTG 3: Pt will report a 10 point improvement in his ODI to meet the [facility]ID            PLAN Treatment Frequency and Duration:  PT Frequency: 1x/week; 2x/week PT Duration: -- (16 visits)  Recommended PT Treatment/Interventions: No data recorded  Recommended Consults:  None currently  Development of Plan of Care:  No change in POC.  Total Treatment Time (Time & Untimed): Total Treatment Time: 38 Total Time in Timed Codes: Time in Timed Codes: 38     Treatment/Procedures Neuromuscular Reeducation minutes: 38             The patient has been instructed to contact our clinic if any questions or problems should arise.

## 2024-12-31 ENCOUNTER — Telehealth: Payer: Self-pay | Admitting: Internal Medicine

## 2024-12-31 ENCOUNTER — Ambulatory Visit: Attending: Internal Medicine | Admitting: Internal Medicine

## 2024-12-31 VITALS — BP 126/72 | HR 71 | Ht 72.0 in | Wt 196.2 lb

## 2024-12-31 DIAGNOSIS — E785 Hyperlipidemia, unspecified: Secondary | ICD-10-CM | POA: Insufficient documentation

## 2024-12-31 DIAGNOSIS — Z789 Other specified health status: Secondary | ICD-10-CM | POA: Diagnosis present

## 2024-12-31 DIAGNOSIS — E118 Type 2 diabetes mellitus with unspecified complications: Secondary | ICD-10-CM | POA: Insufficient documentation

## 2024-12-31 DIAGNOSIS — I2510 Atherosclerotic heart disease of native coronary artery without angina pectoris: Secondary | ICD-10-CM | POA: Insufficient documentation

## 2024-12-31 DIAGNOSIS — I10 Essential (primary) hypertension: Secondary | ICD-10-CM | POA: Diagnosis present

## 2024-12-31 NOTE — Progress Notes (Signed)
 " Cardiology Office Note:  .   Date:  12/31/2024  ID:  DELMON ANDRADA, DOB 06/03/1951, MRN [id number] PCP: Nikki Rams, Aliene, MD  [facility] Providers Cardiologist:  Soyla DELENA Merck, MD    History of Present Illness: .   KAAN TOSH is a 74 y.o. male.  Discussed the use of AI scribe software for clinical note transcription with the patient, who gave verbal consent to proceed.  History of Present Illness KHALIL SZCZEPANIK Will is a 74 year old male with coronary artery disease and treated HIV who presents for follow-up after recent NSTEMI and stent placement.  He has coronary artery disease with prior LAD stent placement in 2023 and an NSTEMI in September 2025 treated with a distal circumflex stent. He had severe shortness of breath and a feeling of overwhelming doom on Brilinta , so this was stopped. He is now on aspirin  81 mg and clopidogrel  75 mg daily for secondary prevention, along with Zetia  10 mg and rosuvastatin  20 mg daily for hyperlipidemia.  He also has hypertension, hyperlipidemia, diabetes mellitus type 2, chronic kidney disease stage 3, PTSD, HIV, and treated hepatitis C. Cholesterol has been well controlled. He has a strong family history of heart disease, including a brother who died in his forties and parents who died in their sixties from heart disease, and another brother with triple bypass.  His HIV is stable per patient report on [facility]  with normal CD4 counts and low viral loads, aside from a slight increase during a prior medication switch.  He is physically active, walking about 20,000 steps daily. He has some muscle pain that he attributes to activity and is in physical therapy.  He has no current chest pain or shortness of breath. He is concerned about progression of his coronary artery disease despite lipid control and lifestyle and is interested in more aggressive therapy to better address his risk. Can also consider genetic  testing.    ROS: negative except per HPI above.  Studies Reviewed: .        Results Labs Lipid panel: Within normal limits HIV viral load: Very low or undetectable CD4 count: Normal and stable  Diagnostic EKG (08/2024): Normal Risk Assessment/Calculations:       Physical Exam:   VS:  BP 126/72 (BP Location: Left Arm, Patient Position: Sitting, Cuff Size: Normal)   Pulse 71   Ht 6' (1.829 m)   Wt 196 lb 3.2 oz (89 kg)   SpO2 97%   BMI 26.61 kg/m    Wt Readings from Last 3 Encounters:  12/31/24 196 lb 3.2 oz (89 kg)  09/26/24 188 lb (85.3 kg)  09/06/24 182 lb 8.7 oz (82.8 kg)     Physical Exam GENERAL: Alert, cooperative, well developed, no acute distress HEENT: Normocephalic, normal oropharynx, moist mucous membranes CHEST: Clear to auscultation bilaterally, no wheezes, rhonchi, or crackles CARDIOVASCULAR: Normal heart rate and rhythm, S1 and S2 normal without murmurs ABDOMEN: Soft, non-tender, non-distended, without organomegaly, normal bowel sounds EXTREMITIES: No cyanosis or edema NEUROLOGICAL: Cranial nerves grossly intact, moves all extremities without gross motor or sensory deficit   ASSESSMENT AND PLAN: .    Assessment and Plan Assessment & Plan Coronary artery disease with prior stents and history of NSTEMI Coronary artery disease with prior LAD and distal circumflex stenting. Accelerated progression possibly due to genetic factors. HIV unlikely primary driver due to good control. - Continue aspirin  81 mg daily and clopidogrel  75 mg daily. I have instructed the patient  that dual antiplatelet therapy should be taken for 1 year without interruption.  We have discussed the consequences of interrupted dual antiplatelet therapy and the risk for in-stent thrombosis.  - Referred to pharmacist for evaluation of PCSK9 inhibitor therapy. - Maintain current exercise regimen and lifestyle modifications.  Hyperlipidemia with statin intolerance and planned PCSK9  inhibitor therapy Hyperlipidemia with controlled cholesterol on rosuvastatin  and Zetia . Statin intolerance suspected. PCSK9 inhibitors considered for aggressive management. - Continue rosuvastatin  20 mg daily and Zetia  10 mg daily. - Referred to pharmacist for evaluation of PCSK9 inhibitor therapy. - Consider genetic testing for familial hyperlipidemia through [facility] program.  Hypertension Well controlled with current management. - Continue current antihypertensive regimen. - continue propranolol  60 mg daily.   DM2 - on jardiance  10 mg daily          Soyla Merck, MD, [facility] "

## 2024-12-31 NOTE — Patient Instructions (Addendum)
 Medication Instructions:  No Changes  Lab Work: None  Follow-Up: At [facility], you and your health needs are our priority.  As part of our continuing mission to provide you with exceptional heart care, our providers are all part of one team.  This team includes your primary Cardiologist (physician) and Advanced Practice Providers or APPs (Physician Assistants and Nurse Practitioners) who all work together to provide you with the care you need, when you need it.  Your next appointment:   6 month(s) (Call in May 2026 for a July 2026 appointment)  Provider:   Scot Ford, PA  Other: Dr. Loni would like you to see our Pharmacy team here at [facility] to discuss PCSK9 inhibitor medications (such as injectables like Repatha)

## 2024-12-31 NOTE — Telephone Encounter (Signed)
 I called pt to inform of sched appt with PharmD. No answer & unable to leave vm.

## 2025-01-21 ENCOUNTER — Ambulatory Visit

## 2025-01-21 NOTE — Telephone Encounter (Signed)
 Called patient to reschedule PharmD appointment due to delayed office opening/weather. NR-LVM for patient to call back and reschedule PharmD appt

## 2025-01-25 ENCOUNTER — Other Ambulatory Visit: Payer: Self-pay | Admitting: Medical Genetics

## 2025-02-14 ENCOUNTER — Ambulatory Visit: Admitting: Dermatology

## 2025-03-05 ENCOUNTER — Ambulatory Visit
